# Patient Record
Sex: Female | Born: 1935
Health system: Southern US, Community
[De-identification: ages and names within clinical notes are randomized; demographics above are authoritative.]

## PROBLEM LIST (undated history)

## (undated) DIAGNOSIS — Z9289 Personal history of other medical treatment: Secondary | ICD-10-CM

## (undated) DIAGNOSIS — I4729 Other ventricular tachycardia: Secondary | ICD-10-CM

## (undated) DIAGNOSIS — I472 Ventricular tachycardia: Secondary | ICD-10-CM

## (undated) DIAGNOSIS — I4510 Unspecified right bundle-branch block: Secondary | ICD-10-CM

## (undated) DIAGNOSIS — D509 Iron deficiency anemia, unspecified: Secondary | ICD-10-CM

## (undated) DIAGNOSIS — K219 Gastro-esophageal reflux disease without esophagitis: Secondary | ICD-10-CM

## (undated) DIAGNOSIS — S82143A Displaced bicondylar fracture of unspecified tibia, initial encounter for closed fracture: Secondary | ICD-10-CM

## (undated) DIAGNOSIS — I482 Chronic atrial fibrillation, unspecified: Secondary | ICD-10-CM

## (undated) DIAGNOSIS — R809 Proteinuria, unspecified: Secondary | ICD-10-CM

## (undated) DIAGNOSIS — K922 Gastrointestinal hemorrhage, unspecified: Secondary | ICD-10-CM

## (undated) DIAGNOSIS — I059 Rheumatic mitral valve disease, unspecified: Secondary | ICD-10-CM

## (undated) DIAGNOSIS — I10 Essential (primary) hypertension: Secondary | ICD-10-CM

## (undated) DIAGNOSIS — K649 Unspecified hemorrhoids: Secondary | ICD-10-CM

## (undated) DIAGNOSIS — I420 Dilated cardiomyopathy: Secondary | ICD-10-CM

## (undated) HISTORY — DX: Iron deficiency anemia, unspecified: D50.9

## (undated) HISTORY — PX: COLONOSCOPY: SHX174

## (undated) HISTORY — PX: TUBAL LIGATION: SHX77

## (undated) HISTORY — DX: Chronic atrial fibrillation, unspecified: I48.20

## (undated) HISTORY — DX: Essential (primary) hypertension: I10

## (undated) HISTORY — PX: COLONOSCOPY W/ POLYPECTOMY: SHX1380

## (undated) HISTORY — DX: Gastrointestinal hemorrhage, unspecified: K92.2

## (undated) HISTORY — PX: US ECHOCARDIOGRAPHY: HXRAD669

## (undated) HISTORY — DX: Ventricular tachycardia: I47.2

## (undated) HISTORY — DX: Unspecified right bundle-branch block: I45.10

## (undated) HISTORY — DX: Gastro-esophageal reflux disease without esophagitis: K21.9

## (undated) HISTORY — DX: Other ventricular tachycardia: I47.29

## (undated) HISTORY — DX: Dilated cardiomyopathy: I42.0

---

## 1985-01-20 HISTORY — PX: MITRAL VALVE REPLACEMENT: SHX147

## 1997-06-08 ENCOUNTER — Encounter: Admission: RE | Admit: 1997-06-08 | Discharge: 1997-06-08 | Payer: Self-pay | Admitting: Family Medicine

## 1997-07-26 ENCOUNTER — Encounter: Admission: RE | Admit: 1997-07-26 | Discharge: 1997-07-26 | Payer: Self-pay | Admitting: Family Medicine

## 1997-08-14 ENCOUNTER — Encounter: Admission: RE | Admit: 1997-08-14 | Discharge: 1997-08-14 | Payer: Self-pay | Admitting: Family Medicine

## 1997-09-29 ENCOUNTER — Inpatient Hospital Stay (HOSPITAL_COMMUNITY): Admission: AD | Admit: 1997-09-29 | Discharge: 1997-10-11 | Payer: Self-pay | Admitting: Cardiology

## 1998-01-19 ENCOUNTER — Emergency Department (HOSPITAL_COMMUNITY): Admission: EM | Admit: 1998-01-19 | Discharge: 1998-01-19 | Payer: Self-pay | Admitting: Emergency Medicine

## 1998-01-25 ENCOUNTER — Inpatient Hospital Stay (HOSPITAL_COMMUNITY): Admission: AD | Admit: 1998-01-25 | Discharge: 1998-02-03 | Payer: Self-pay | Admitting: Internal Medicine

## 1998-01-31 ENCOUNTER — Encounter: Payer: Self-pay | Admitting: Internal Medicine

## 1998-02-03 ENCOUNTER — Emergency Department (HOSPITAL_COMMUNITY): Admission: EM | Admit: 1998-02-03 | Discharge: 1998-02-03 | Payer: Self-pay | Admitting: Emergency Medicine

## 1998-02-23 ENCOUNTER — Encounter: Admission: RE | Admit: 1998-02-23 | Discharge: 1998-02-23 | Payer: Self-pay | Admitting: Family Medicine

## 1998-04-08 ENCOUNTER — Emergency Department (HOSPITAL_COMMUNITY): Admission: EM | Admit: 1998-04-08 | Discharge: 1998-04-08 | Payer: Self-pay | Admitting: Emergency Medicine

## 1998-05-08 ENCOUNTER — Encounter: Admission: RE | Admit: 1998-05-08 | Discharge: 1998-05-08 | Payer: Self-pay | Admitting: Family Medicine

## 1998-06-26 ENCOUNTER — Encounter: Admission: RE | Admit: 1998-06-26 | Discharge: 1998-06-26 | Payer: Self-pay | Admitting: Sports Medicine

## 1998-07-04 ENCOUNTER — Encounter: Admission: RE | Admit: 1998-07-04 | Discharge: 1998-07-04 | Payer: Self-pay | Admitting: Family Medicine

## 1998-07-16 ENCOUNTER — Encounter: Admission: RE | Admit: 1998-07-16 | Discharge: 1998-07-16 | Payer: Self-pay | Admitting: Family Medicine

## 1998-08-11 ENCOUNTER — Inpatient Hospital Stay (HOSPITAL_COMMUNITY): Admission: EM | Admit: 1998-08-11 | Discharge: 1998-08-14 | Payer: Self-pay | Admitting: Emergency Medicine

## 1998-08-11 ENCOUNTER — Encounter: Payer: Self-pay | Admitting: Emergency Medicine

## 1998-08-13 ENCOUNTER — Encounter: Payer: Self-pay | Admitting: Emergency Medicine

## 1998-08-20 ENCOUNTER — Encounter: Admission: RE | Admit: 1998-08-20 | Discharge: 1998-08-20 | Payer: Self-pay | Admitting: Family Medicine

## 1998-09-03 ENCOUNTER — Encounter: Admission: RE | Admit: 1998-09-03 | Discharge: 1998-09-03 | Payer: Self-pay | Admitting: Family Medicine

## 1998-10-01 ENCOUNTER — Ambulatory Visit (HOSPITAL_COMMUNITY): Admission: RE | Admit: 1998-10-01 | Discharge: 1998-10-01 | Payer: Self-pay | Admitting: Internal Medicine

## 1999-01-08 ENCOUNTER — Encounter: Admission: RE | Admit: 1999-01-08 | Discharge: 1999-01-08 | Payer: Self-pay | Admitting: Sports Medicine

## 1999-02-05 ENCOUNTER — Encounter: Payer: Self-pay | Admitting: Gastroenterology

## 1999-02-05 ENCOUNTER — Encounter: Admission: RE | Admit: 1999-02-05 | Discharge: 1999-02-05 | Payer: Self-pay | Admitting: Gastroenterology

## 1999-03-14 ENCOUNTER — Encounter: Admission: RE | Admit: 1999-03-14 | Discharge: 1999-03-14 | Payer: Self-pay | Admitting: Family Medicine

## 1999-04-04 ENCOUNTER — Encounter: Admission: RE | Admit: 1999-04-04 | Discharge: 1999-04-04 | Payer: Self-pay | Admitting: Family Medicine

## 1999-04-22 ENCOUNTER — Encounter: Admission: RE | Admit: 1999-04-22 | Discharge: 1999-07-21 | Payer: Self-pay | Admitting: Orthopedic Surgery

## 1999-04-24 ENCOUNTER — Encounter: Admission: RE | Admit: 1999-04-24 | Discharge: 1999-04-24 | Payer: Self-pay | Admitting: Family Medicine

## 1999-04-24 ENCOUNTER — Other Ambulatory Visit: Admission: RE | Admit: 1999-04-24 | Discharge: 1999-04-24 | Payer: Self-pay | Admitting: Family Medicine

## 1999-05-11 ENCOUNTER — Inpatient Hospital Stay (HOSPITAL_COMMUNITY): Admission: EM | Admit: 1999-05-11 | Discharge: 1999-05-17 | Payer: Self-pay | Admitting: Emergency Medicine

## 1999-05-11 ENCOUNTER — Encounter: Payer: Self-pay | Admitting: *Deleted

## 1999-05-12 ENCOUNTER — Encounter: Payer: Self-pay | Admitting: *Deleted

## 1999-05-16 ENCOUNTER — Encounter: Payer: Self-pay | Admitting: *Deleted

## 1999-05-29 ENCOUNTER — Encounter (HOSPITAL_COMMUNITY): Admission: RE | Admit: 1999-05-29 | Discharge: 1999-08-27 | Payer: Self-pay | Admitting: Dentistry

## 1999-05-31 ENCOUNTER — Encounter: Admission: RE | Admit: 1999-05-31 | Discharge: 1999-05-31 | Payer: Self-pay | Admitting: Family Medicine

## 1999-06-12 ENCOUNTER — Encounter: Admission: RE | Admit: 1999-06-12 | Discharge: 1999-06-12 | Payer: Self-pay | Admitting: Family Medicine

## 1999-07-09 ENCOUNTER — Ambulatory Visit (HOSPITAL_COMMUNITY): Admission: RE | Admit: 1999-07-09 | Discharge: 1999-07-09 | Payer: Self-pay | Admitting: Dentistry

## 1999-07-14 ENCOUNTER — Emergency Department (HOSPITAL_COMMUNITY): Admission: EM | Admit: 1999-07-14 | Discharge: 1999-07-14 | Payer: Self-pay | Admitting: Emergency Medicine

## 1999-07-15 ENCOUNTER — Emergency Department (HOSPITAL_COMMUNITY): Admission: EM | Admit: 1999-07-15 | Discharge: 1999-07-15 | Payer: Self-pay | Admitting: Emergency Medicine

## 1999-07-16 ENCOUNTER — Inpatient Hospital Stay (HOSPITAL_COMMUNITY): Admission: EM | Admit: 1999-07-16 | Discharge: 1999-07-18 | Payer: Self-pay | Admitting: Emergency Medicine

## 1999-09-02 ENCOUNTER — Encounter: Admission: RE | Admit: 1999-09-02 | Discharge: 1999-09-02 | Payer: Self-pay | Admitting: Sports Medicine

## 1999-09-02 ENCOUNTER — Encounter: Payer: Self-pay | Admitting: Sports Medicine

## 1999-09-02 ENCOUNTER — Encounter (HOSPITAL_COMMUNITY): Admission: RE | Admit: 1999-09-02 | Discharge: 1999-12-01 | Payer: Self-pay | Admitting: Dentistry

## 2000-01-03 ENCOUNTER — Encounter (HOSPITAL_COMMUNITY): Admission: RE | Admit: 2000-01-03 | Discharge: 2000-04-02 | Payer: Self-pay | Admitting: Dentistry

## 2000-02-14 ENCOUNTER — Inpatient Hospital Stay (HOSPITAL_COMMUNITY): Admission: EM | Admit: 2000-02-14 | Discharge: 2000-02-16 | Payer: Self-pay | Admitting: Emergency Medicine

## 2000-02-14 ENCOUNTER — Encounter: Payer: Self-pay | Admitting: Family Medicine

## 2000-02-14 ENCOUNTER — Encounter: Admission: RE | Admit: 2000-02-14 | Discharge: 2000-02-14 | Payer: Self-pay | Admitting: Family Medicine

## 2000-02-17 ENCOUNTER — Encounter: Admission: RE | Admit: 2000-02-17 | Discharge: 2000-02-17 | Payer: Self-pay | Admitting: Family Medicine

## 2000-02-24 ENCOUNTER — Encounter: Admission: RE | Admit: 2000-02-24 | Discharge: 2000-02-24 | Payer: Self-pay | Admitting: Family Medicine

## 2000-06-19 ENCOUNTER — Encounter: Admission: RE | Admit: 2000-06-19 | Discharge: 2000-06-19 | Payer: Self-pay | Admitting: Family Medicine

## 2000-07-27 ENCOUNTER — Encounter (HOSPITAL_COMMUNITY): Admission: RE | Admit: 2000-07-27 | Discharge: 2000-10-25 | Payer: Self-pay | Admitting: Dentistry

## 2000-09-03 ENCOUNTER — Encounter: Payer: Self-pay | Admitting: Family Medicine

## 2000-09-03 ENCOUNTER — Encounter: Admission: RE | Admit: 2000-09-03 | Discharge: 2000-09-03 | Payer: Self-pay | Admitting: Family Medicine

## 2001-01-08 ENCOUNTER — Encounter: Admission: RE | Admit: 2001-01-08 | Discharge: 2001-01-08 | Payer: Self-pay | Admitting: Family Medicine

## 2001-03-05 ENCOUNTER — Encounter (HOSPITAL_COMMUNITY): Admission: RE | Admit: 2001-03-05 | Discharge: 2001-06-03 | Payer: Self-pay | Admitting: Internal Medicine

## 2001-06-01 ENCOUNTER — Inpatient Hospital Stay (HOSPITAL_COMMUNITY): Admission: AD | Admit: 2001-06-01 | Discharge: 2001-06-11 | Payer: Self-pay | Admitting: Internal Medicine

## 2001-06-04 ENCOUNTER — Encounter: Payer: Self-pay | Admitting: Internal Medicine

## 2001-06-05 ENCOUNTER — Encounter: Payer: Self-pay | Admitting: Internal Medicine

## 2001-06-06 ENCOUNTER — Encounter: Payer: Self-pay | Admitting: Gastroenterology

## 2001-06-07 ENCOUNTER — Encounter: Payer: Self-pay | Admitting: Gastroenterology

## 2001-06-08 ENCOUNTER — Encounter: Payer: Self-pay | Admitting: Internal Medicine

## 2001-07-07 ENCOUNTER — Encounter: Admission: RE | Admit: 2001-07-07 | Discharge: 2001-07-07 | Payer: Self-pay | Admitting: Family Medicine

## 2002-04-26 ENCOUNTER — Encounter: Admission: RE | Admit: 2002-04-26 | Discharge: 2002-04-26 | Payer: Self-pay | Admitting: Dentistry

## 2002-09-05 ENCOUNTER — Encounter: Admission: RE | Admit: 2002-09-05 | Discharge: 2002-09-05 | Payer: Self-pay | Admitting: Sports Medicine

## 2002-09-12 ENCOUNTER — Encounter: Admission: RE | Admit: 2002-09-12 | Discharge: 2002-09-12 | Payer: Self-pay | Admitting: Sports Medicine

## 2002-09-16 ENCOUNTER — Emergency Department (HOSPITAL_COMMUNITY): Admission: EM | Admit: 2002-09-16 | Discharge: 2002-09-16 | Payer: Self-pay | Admitting: Emergency Medicine

## 2002-09-24 ENCOUNTER — Encounter (INDEPENDENT_AMBULATORY_CARE_PROVIDER_SITE_OTHER): Payer: Self-pay | Admitting: *Deleted

## 2002-09-24 LAB — CONVERTED CEMR LAB

## 2002-09-28 ENCOUNTER — Encounter: Admission: RE | Admit: 2002-09-28 | Discharge: 2002-09-28 | Payer: Self-pay | Admitting: Family Medicine

## 2002-09-28 ENCOUNTER — Other Ambulatory Visit: Admission: RE | Admit: 2002-09-28 | Discharge: 2002-09-28 | Payer: Self-pay | Admitting: Family Medicine

## 2002-10-25 ENCOUNTER — Encounter: Payer: Self-pay | Admitting: Family Medicine

## 2002-10-25 ENCOUNTER — Encounter: Admission: RE | Admit: 2002-10-25 | Discharge: 2002-10-25 | Payer: Self-pay | Admitting: Family Medicine

## 2002-10-28 ENCOUNTER — Encounter: Admission: RE | Admit: 2002-10-28 | Discharge: 2002-10-28 | Payer: Self-pay | Admitting: Sports Medicine

## 2003-01-21 HISTORY — PX: CHOLECYSTECTOMY: SHX55

## 2003-01-27 ENCOUNTER — Encounter: Admission: RE | Admit: 2003-01-27 | Discharge: 2003-01-27 | Payer: Self-pay | Admitting: Family Medicine

## 2003-01-30 ENCOUNTER — Encounter: Admission: RE | Admit: 2003-01-30 | Discharge: 2003-01-30 | Payer: Self-pay | Admitting: Sports Medicine

## 2003-02-03 ENCOUNTER — Encounter: Admission: RE | Admit: 2003-02-03 | Discharge: 2003-02-03 | Payer: Self-pay | Admitting: Family Medicine

## 2003-02-24 ENCOUNTER — Encounter: Admission: RE | Admit: 2003-02-24 | Discharge: 2003-02-24 | Payer: Self-pay | Admitting: Family Medicine

## 2003-04-26 ENCOUNTER — Encounter: Admission: RE | Admit: 2003-04-26 | Discharge: 2003-04-26 | Payer: Self-pay | Admitting: Family Medicine

## 2003-06-19 ENCOUNTER — Inpatient Hospital Stay (HOSPITAL_COMMUNITY): Admission: EM | Admit: 2003-06-19 | Discharge: 2003-06-21 | Payer: Self-pay | Admitting: *Deleted

## 2003-08-31 ENCOUNTER — Encounter: Admission: RE | Admit: 2003-08-31 | Discharge: 2003-08-31 | Payer: Self-pay | Admitting: Sports Medicine

## 2003-11-24 ENCOUNTER — Ambulatory Visit: Payer: Self-pay | Admitting: Cardiovascular Disease

## 2003-11-27 ENCOUNTER — Encounter: Admission: RE | Admit: 2003-11-27 | Discharge: 2003-11-27 | Payer: Self-pay | Admitting: Sports Medicine

## 2003-11-29 ENCOUNTER — Ambulatory Visit: Payer: Self-pay | Admitting: Family Medicine

## 2003-12-22 ENCOUNTER — Ambulatory Visit: Payer: Self-pay | Admitting: Cardiology

## 2004-01-10 ENCOUNTER — Ambulatory Visit: Payer: Self-pay | Admitting: Family Medicine

## 2004-01-16 ENCOUNTER — Ambulatory Visit (HOSPITAL_COMMUNITY): Admission: RE | Admit: 2004-01-16 | Discharge: 2004-01-16 | Payer: Self-pay | Admitting: Sports Medicine

## 2004-01-24 ENCOUNTER — Ambulatory Visit: Payer: Self-pay | Admitting: Cardiology

## 2004-01-25 ENCOUNTER — Ambulatory Visit: Payer: Self-pay | Admitting: Internal Medicine

## 2004-02-02 ENCOUNTER — Ambulatory Visit (HOSPITAL_COMMUNITY): Admission: RE | Admit: 2004-02-02 | Discharge: 2004-02-02 | Payer: Self-pay | Admitting: *Deleted

## 2004-02-02 ENCOUNTER — Encounter (INDEPENDENT_AMBULATORY_CARE_PROVIDER_SITE_OTHER): Payer: Self-pay | Admitting: *Deleted

## 2004-02-07 ENCOUNTER — Ambulatory Visit: Payer: Self-pay | Admitting: Cardiology

## 2004-02-15 ENCOUNTER — Ambulatory Visit: Payer: Self-pay | Admitting: Cardiology

## 2004-02-29 ENCOUNTER — Ambulatory Visit: Payer: Self-pay | Admitting: Cardiology

## 2004-03-22 ENCOUNTER — Ambulatory Visit: Payer: Self-pay | Admitting: Cardiology

## 2004-04-05 ENCOUNTER — Ambulatory Visit: Payer: Self-pay | Admitting: Cardiology

## 2004-04-19 ENCOUNTER — Ambulatory Visit: Payer: Self-pay | Admitting: Cardiology

## 2004-05-06 ENCOUNTER — Ambulatory Visit: Payer: Self-pay | Admitting: *Deleted

## 2004-06-04 ENCOUNTER — Ambulatory Visit: Payer: Self-pay | Admitting: Cardiology

## 2004-06-19 ENCOUNTER — Ambulatory Visit: Payer: Self-pay | Admitting: Cardiology

## 2004-07-05 ENCOUNTER — Ambulatory Visit: Payer: Self-pay | Admitting: Cardiology

## 2004-07-15 ENCOUNTER — Ambulatory Visit: Payer: Self-pay | Admitting: Family Medicine

## 2004-07-15 ENCOUNTER — Ambulatory Visit: Payer: Self-pay | Admitting: Sports Medicine

## 2004-07-15 ENCOUNTER — Ambulatory Visit (HOSPITAL_COMMUNITY): Admission: RE | Admit: 2004-07-15 | Discharge: 2004-07-15 | Payer: Self-pay | Admitting: Sports Medicine

## 2004-07-15 ENCOUNTER — Inpatient Hospital Stay (HOSPITAL_COMMUNITY): Admission: AD | Admit: 2004-07-15 | Discharge: 2004-07-18 | Payer: Self-pay | Admitting: Family Medicine

## 2004-07-17 ENCOUNTER — Ambulatory Visit: Payer: Self-pay | Admitting: Cardiology

## 2004-07-17 ENCOUNTER — Encounter: Payer: Self-pay | Admitting: Cardiology

## 2004-07-24 ENCOUNTER — Ambulatory Visit: Payer: Self-pay

## 2004-07-26 ENCOUNTER — Ambulatory Visit: Payer: Self-pay | Admitting: Family Medicine

## 2004-08-02 ENCOUNTER — Ambulatory Visit: Payer: Self-pay | Admitting: Cardiovascular Disease

## 2004-08-27 ENCOUNTER — Ambulatory Visit: Payer: Self-pay | Admitting: Cardiology

## 2004-09-30 ENCOUNTER — Ambulatory Visit: Payer: Self-pay | Admitting: Internal Medicine

## 2004-10-28 ENCOUNTER — Ambulatory Visit: Payer: Self-pay | Admitting: Cardiology

## 2004-11-11 ENCOUNTER — Encounter: Admission: RE | Admit: 2004-11-11 | Discharge: 2004-11-11 | Payer: Self-pay | Admitting: Internal Medicine

## 2004-11-15 ENCOUNTER — Emergency Department (HOSPITAL_COMMUNITY): Admission: EM | Admit: 2004-11-15 | Discharge: 2004-11-15 | Payer: Self-pay | Admitting: Emergency Medicine

## 2004-11-15 ENCOUNTER — Ambulatory Visit: Payer: Self-pay | Admitting: Dentistry

## 2004-11-26 ENCOUNTER — Ambulatory Visit: Payer: Self-pay | Admitting: Internal Medicine

## 2004-12-23 ENCOUNTER — Ambulatory Visit: Payer: Self-pay | Admitting: Cardiology

## 2005-01-10 ENCOUNTER — Ambulatory Visit: Payer: Self-pay | Admitting: Internal Medicine

## 2005-02-07 ENCOUNTER — Ambulatory Visit: Payer: Self-pay | Admitting: Cardiology

## 2005-02-26 ENCOUNTER — Ambulatory Visit: Payer: Self-pay | Admitting: Cardiology

## 2005-02-28 ENCOUNTER — Ambulatory Visit: Payer: Self-pay | Admitting: Dentistry

## 2005-03-17 ENCOUNTER — Ambulatory Visit: Payer: Self-pay | Admitting: Internal Medicine

## 2005-03-20 ENCOUNTER — Ambulatory Visit: Payer: Self-pay | Admitting: Internal Medicine

## 2005-04-01 ENCOUNTER — Ambulatory Visit: Payer: Self-pay | Admitting: *Deleted

## 2005-04-15 ENCOUNTER — Ambulatory Visit: Payer: Self-pay | Admitting: Cardiology

## 2005-05-16 ENCOUNTER — Ambulatory Visit: Payer: Self-pay | Admitting: Cardiology

## 2005-05-26 ENCOUNTER — Ambulatory Visit: Payer: Self-pay | Admitting: Cardiology

## 2005-06-01 ENCOUNTER — Inpatient Hospital Stay (HOSPITAL_COMMUNITY): Admission: EM | Admit: 2005-06-01 | Discharge: 2005-06-04 | Payer: Self-pay | Admitting: Emergency Medicine

## 2005-06-12 ENCOUNTER — Ambulatory Visit: Payer: Self-pay | Admitting: Cardiology

## 2005-06-26 ENCOUNTER — Ambulatory Visit: Payer: Self-pay | Admitting: Internal Medicine

## 2005-07-24 ENCOUNTER — Ambulatory Visit: Payer: Self-pay | Admitting: Cardiology

## 2005-08-07 ENCOUNTER — Ambulatory Visit: Payer: Self-pay | Admitting: Cardiology

## 2005-08-14 ENCOUNTER — Emergency Department (HOSPITAL_COMMUNITY): Admission: EM | Admit: 2005-08-14 | Discharge: 2005-08-14 | Payer: Self-pay | Admitting: Family Medicine

## 2005-08-20 ENCOUNTER — Emergency Department (HOSPITAL_COMMUNITY): Admission: EM | Admit: 2005-08-20 | Discharge: 2005-08-20 | Payer: Self-pay | Admitting: Family Medicine

## 2005-08-28 ENCOUNTER — Ambulatory Visit: Payer: Self-pay | Admitting: Cardiology

## 2005-09-05 ENCOUNTER — Ambulatory Visit: Payer: Self-pay

## 2005-09-05 ENCOUNTER — Ambulatory Visit: Payer: Self-pay | Admitting: Internal Medicine

## 2005-09-17 ENCOUNTER — Ambulatory Visit: Payer: Self-pay | Admitting: Cardiology

## 2005-09-19 ENCOUNTER — Ambulatory Visit: Payer: Self-pay | Admitting: Dentistry

## 2005-10-15 ENCOUNTER — Ambulatory Visit: Payer: Self-pay | Admitting: Cardiology

## 2005-11-11 ENCOUNTER — Ambulatory Visit: Payer: Self-pay | Admitting: Cardiology

## 2005-12-08 ENCOUNTER — Ambulatory Visit: Payer: Self-pay | Admitting: Cardiology

## 2006-01-07 ENCOUNTER — Ambulatory Visit: Payer: Self-pay | Admitting: *Deleted

## 2006-01-20 HISTORY — PX: HEMICOLECTOMY: SHX854

## 2006-01-26 ENCOUNTER — Ambulatory Visit: Payer: Self-pay | Admitting: Internal Medicine

## 2006-01-26 ENCOUNTER — Inpatient Hospital Stay (HOSPITAL_COMMUNITY): Admission: AD | Admit: 2006-01-26 | Discharge: 2006-02-16 | Payer: Self-pay | Admitting: Gastroenterology

## 2006-01-27 ENCOUNTER — Encounter (INDEPENDENT_AMBULATORY_CARE_PROVIDER_SITE_OTHER): Payer: Self-pay | Admitting: Specialist

## 2006-02-03 ENCOUNTER — Encounter (INDEPENDENT_AMBULATORY_CARE_PROVIDER_SITE_OTHER): Payer: Self-pay | Admitting: *Deleted

## 2006-02-19 ENCOUNTER — Ambulatory Visit: Payer: Self-pay | Admitting: Cardiology

## 2006-03-10 ENCOUNTER — Ambulatory Visit: Payer: Self-pay | Admitting: Cardiology

## 2006-03-19 DIAGNOSIS — I4891 Unspecified atrial fibrillation: Secondary | ICD-10-CM | POA: Insufficient documentation

## 2006-03-19 DIAGNOSIS — K2100 Gastro-esophageal reflux disease with esophagitis, without bleeding: Secondary | ICD-10-CM | POA: Insufficient documentation

## 2006-03-19 DIAGNOSIS — IMO0002 Reserved for concepts with insufficient information to code with codable children: Secondary | ICD-10-CM | POA: Insufficient documentation

## 2006-03-19 DIAGNOSIS — J309 Allergic rhinitis, unspecified: Secondary | ICD-10-CM | POA: Insufficient documentation

## 2006-03-19 DIAGNOSIS — M1710 Unilateral primary osteoarthritis, unspecified knee: Secondary | ICD-10-CM | POA: Insufficient documentation

## 2006-03-19 DIAGNOSIS — D509 Iron deficiency anemia, unspecified: Secondary | ICD-10-CM | POA: Insufficient documentation

## 2006-03-19 DIAGNOSIS — I10 Essential (primary) hypertension: Secondary | ICD-10-CM | POA: Insufficient documentation

## 2006-03-19 DIAGNOSIS — M810 Age-related osteoporosis without current pathological fracture: Secondary | ICD-10-CM | POA: Insufficient documentation

## 2006-03-20 ENCOUNTER — Encounter (INDEPENDENT_AMBULATORY_CARE_PROVIDER_SITE_OTHER): Payer: Self-pay | Admitting: *Deleted

## 2006-03-31 ENCOUNTER — Ambulatory Visit: Payer: Self-pay | Admitting: Cardiology

## 2006-04-07 ENCOUNTER — Ambulatory Visit: Payer: Self-pay | Admitting: Cardiology

## 2006-04-20 ENCOUNTER — Encounter: Payer: Self-pay | Admitting: Internal Medicine

## 2006-04-20 ENCOUNTER — Ambulatory Visit: Payer: Self-pay

## 2006-04-22 ENCOUNTER — Encounter: Admission: RE | Admit: 2006-04-22 | Discharge: 2006-04-22 | Payer: Self-pay | Admitting: Family Medicine

## 2006-05-01 ENCOUNTER — Ambulatory Visit: Payer: Self-pay | Admitting: Internal Medicine

## 2006-05-15 ENCOUNTER — Ambulatory Visit: Payer: Self-pay | Admitting: Cardiology

## 2006-05-29 ENCOUNTER — Ambulatory Visit: Payer: Self-pay | Admitting: Cardiology

## 2006-05-29 ENCOUNTER — Ambulatory Visit: Payer: Self-pay | Admitting: Internal Medicine

## 2006-06-05 ENCOUNTER — Ambulatory Visit: Payer: Self-pay | Admitting: Dentistry

## 2006-06-22 ENCOUNTER — Ambulatory Visit: Payer: Self-pay | Admitting: Cardiovascular Disease

## 2006-06-29 ENCOUNTER — Ambulatory Visit: Payer: Self-pay | Admitting: Cardiovascular Disease

## 2006-07-06 ENCOUNTER — Ambulatory Visit: Payer: Self-pay | Admitting: Cardiology

## 2006-07-20 ENCOUNTER — Ambulatory Visit: Payer: Self-pay | Admitting: Cardiology

## 2006-08-10 ENCOUNTER — Ambulatory Visit: Payer: Self-pay | Admitting: Cardiovascular Disease

## 2006-08-19 ENCOUNTER — Other Ambulatory Visit: Admission: RE | Admit: 2006-08-19 | Discharge: 2006-08-19 | Payer: Self-pay | Admitting: Obstetrics and Gynecology

## 2006-08-20 ENCOUNTER — Ambulatory Visit: Payer: Self-pay | Admitting: Cardiology

## 2006-09-03 ENCOUNTER — Ambulatory Visit: Payer: Self-pay | Admitting: Internal Medicine

## 2006-09-28 ENCOUNTER — Ambulatory Visit: Payer: Self-pay | Admitting: Cardiology

## 2006-10-06 ENCOUNTER — Ambulatory Visit: Payer: Self-pay | Admitting: Cardiology

## 2006-10-20 ENCOUNTER — Ambulatory Visit: Payer: Self-pay | Admitting: Cardiology

## 2006-11-17 ENCOUNTER — Ambulatory Visit: Payer: Self-pay | Admitting: Cardiology

## 2006-11-25 ENCOUNTER — Ambulatory Visit: Payer: Self-pay | Admitting: Internal Medicine

## 2006-11-27 ENCOUNTER — Ambulatory Visit: Payer: Self-pay

## 2006-12-14 ENCOUNTER — Ambulatory Visit: Payer: Self-pay | Admitting: Dentistry

## 2006-12-18 ENCOUNTER — Ambulatory Visit: Payer: Self-pay | Admitting: Internal Medicine

## 2007-01-06 ENCOUNTER — Ambulatory Visit: Payer: Self-pay | Admitting: Cardiovascular Disease

## 2007-01-26 ENCOUNTER — Ambulatory Visit (HOSPITAL_COMMUNITY): Admission: RE | Admit: 2007-01-26 | Discharge: 2007-01-26 | Payer: Self-pay | Admitting: Gastroenterology

## 2007-02-03 ENCOUNTER — Ambulatory Visit: Payer: Self-pay | Admitting: Cardiovascular Disease

## 2007-02-17 ENCOUNTER — Ambulatory Visit: Payer: Self-pay | Admitting: Cardiology

## 2007-02-19 ENCOUNTER — Encounter: Admission: RE | Admit: 2007-02-19 | Discharge: 2007-02-19 | Payer: Self-pay | Admitting: Internal Medicine

## 2007-03-17 ENCOUNTER — Ambulatory Visit: Payer: Self-pay | Admitting: Internal Medicine

## 2007-04-07 ENCOUNTER — Ambulatory Visit: Payer: Self-pay | Admitting: Cardiology

## 2007-05-05 ENCOUNTER — Ambulatory Visit: Payer: Self-pay | Admitting: Cardiology

## 2007-05-13 ENCOUNTER — Encounter: Admission: RE | Admit: 2007-05-13 | Discharge: 2007-05-13 | Payer: Self-pay | Admitting: Internal Medicine

## 2007-05-19 ENCOUNTER — Ambulatory Visit: Payer: Self-pay | Admitting: Cardiology

## 2007-05-26 ENCOUNTER — Encounter: Admission: RE | Admit: 2007-05-26 | Discharge: 2007-08-24 | Payer: Self-pay | Admitting: Neurosurgery

## 2007-05-29 ENCOUNTER — Emergency Department (HOSPITAL_COMMUNITY): Admission: EM | Admit: 2007-05-29 | Discharge: 2007-05-29 | Payer: Self-pay | Admitting: Emergency Medicine

## 2007-06-09 ENCOUNTER — Ambulatory Visit: Payer: Self-pay | Admitting: Cardiology

## 2007-06-17 ENCOUNTER — Ambulatory Visit: Payer: Self-pay | Admitting: Internal Medicine

## 2007-07-07 ENCOUNTER — Ambulatory Visit: Payer: Self-pay | Admitting: Internal Medicine

## 2007-07-27 ENCOUNTER — Ambulatory Visit: Payer: Self-pay | Admitting: Dentistry

## 2007-07-28 ENCOUNTER — Ambulatory Visit: Payer: Self-pay | Admitting: Internal Medicine

## 2007-08-25 ENCOUNTER — Ambulatory Visit: Payer: Self-pay | Admitting: Cardiology

## 2007-09-24 ENCOUNTER — Ambulatory Visit: Payer: Self-pay | Admitting: Cardiovascular Disease

## 2007-10-25 ENCOUNTER — Ambulatory Visit: Payer: Self-pay | Admitting: Internal Medicine

## 2007-11-22 ENCOUNTER — Ambulatory Visit: Payer: Self-pay | Admitting: Cardiology

## 2007-12-13 ENCOUNTER — Ambulatory Visit: Payer: Self-pay | Admitting: Internal Medicine

## 2007-12-27 ENCOUNTER — Ambulatory Visit: Payer: Self-pay | Admitting: Cardiovascular Disease

## 2008-01-24 ENCOUNTER — Ambulatory Visit: Payer: Self-pay | Admitting: Internal Medicine

## 2008-02-07 ENCOUNTER — Ambulatory Visit: Payer: Self-pay | Admitting: Cardiology

## 2008-02-16 ENCOUNTER — Ambulatory Visit: Payer: Self-pay | Admitting: Cardiovascular Disease

## 2008-02-16 DIAGNOSIS — I4510 Unspecified right bundle-branch block: Secondary | ICD-10-CM | POA: Insufficient documentation

## 2008-02-16 DIAGNOSIS — I472 Ventricular tachycardia, unspecified: Secondary | ICD-10-CM | POA: Insufficient documentation

## 2008-02-16 DIAGNOSIS — Z952 Presence of prosthetic heart valve: Secondary | ICD-10-CM | POA: Insufficient documentation

## 2008-02-24 ENCOUNTER — Ambulatory Visit: Payer: Self-pay | Admitting: Internal Medicine

## 2008-02-29 ENCOUNTER — Ambulatory Visit: Payer: Self-pay | Admitting: Cardiology

## 2008-02-29 ENCOUNTER — Ambulatory Visit: Payer: Self-pay

## 2008-02-29 LAB — CONVERTED CEMR LAB
BUN: 19 mg/dL (ref 6–23)
Basophils Absolute: 0 10*3/uL (ref 0.0–0.1)
Basophils Relative: 0.5 % (ref 0.0–3.0)
CO2: 26 meq/L (ref 19–32)
Calcium: 9 mg/dL (ref 8.4–10.5)
Chloride: 103 meq/L (ref 96–112)
Creatinine, Ser: 1.3 mg/dL — ABNORMAL HIGH (ref 0.4–1.2)
Eosinophils Absolute: 0.1 10*3/uL (ref 0.0–0.7)
Eosinophils Relative: 1.2 % (ref 0.0–5.0)
GFR calc Af Amer: 52 mL/min
GFR calc non Af Amer: 43 mL/min
Glucose, Bld: 90 mg/dL (ref 70–99)
HCT: 34.9 % — ABNORMAL LOW (ref 36.0–46.0)
Hemoglobin: 11.8 g/dL — ABNORMAL LOW (ref 12.0–15.0)
Lymphocytes Relative: 35.5 % (ref 12.0–46.0)
MCHC: 33.9 g/dL (ref 30.0–36.0)
MCV: 86.1 fL (ref 78.0–100.0)
Monocytes Absolute: 0.5 10*3/uL (ref 0.1–1.0)
Monocytes Relative: 6.8 % (ref 3.0–12.0)
Neutro Abs: 4.4 10*3/uL (ref 1.4–7.7)
Neutrophils Relative %: 56 % (ref 43.0–77.0)
Platelets: 176 10*3/uL (ref 150–400)
Potassium: 3.6 meq/L (ref 3.5–5.1)
RBC: 4.06 M/uL (ref 3.87–5.11)
RDW: 13.8 % (ref 11.5–14.6)
Sodium: 137 meq/L (ref 135–145)
WBC: 7.7 10*3/uL (ref 4.5–10.5)

## 2008-03-01 ENCOUNTER — Ambulatory Visit: Payer: Self-pay | Admitting: Cardiology

## 2008-03-01 ENCOUNTER — Ambulatory Visit: Payer: Self-pay

## 2008-03-01 ENCOUNTER — Encounter: Payer: Self-pay | Admitting: Internal Medicine

## 2008-03-17 ENCOUNTER — Ambulatory Visit: Payer: Self-pay | Admitting: Internal Medicine

## 2008-04-04 ENCOUNTER — Ambulatory Visit: Payer: Self-pay | Admitting: Internal Medicine

## 2008-04-28 ENCOUNTER — Ambulatory Visit: Payer: Self-pay | Admitting: Cardiology

## 2008-05-05 ENCOUNTER — Telehealth (INDEPENDENT_AMBULATORY_CARE_PROVIDER_SITE_OTHER): Payer: Self-pay | Admitting: *Deleted

## 2008-05-08 ENCOUNTER — Telehealth: Payer: Self-pay | Admitting: Internal Medicine

## 2008-05-17 ENCOUNTER — Emergency Department (HOSPITAL_COMMUNITY): Admission: EM | Admit: 2008-05-17 | Discharge: 2008-05-17 | Payer: Self-pay | Admitting: Emergency Medicine

## 2008-05-24 ENCOUNTER — Ambulatory Visit: Payer: Self-pay | Admitting: Internal Medicine

## 2008-05-24 ENCOUNTER — Ambulatory Visit: Payer: Self-pay | Admitting: Cardiology

## 2008-05-24 DIAGNOSIS — R350 Frequency of micturition: Secondary | ICD-10-CM | POA: Insufficient documentation

## 2008-05-25 ENCOUNTER — Emergency Department (HOSPITAL_COMMUNITY): Admission: EM | Admit: 2008-05-25 | Discharge: 2008-05-25 | Payer: Self-pay | Admitting: Family Medicine

## 2008-06-21 ENCOUNTER — Ambulatory Visit: Payer: Self-pay | Admitting: Cardiology

## 2008-06-21 ENCOUNTER — Encounter: Payer: Self-pay | Admitting: *Deleted

## 2008-06-21 LAB — CONVERTED CEMR LAB
POC INR: 2.3
Protime: 18.5

## 2008-07-20 ENCOUNTER — Ambulatory Visit: Payer: Self-pay | Admitting: Cardiology

## 2008-07-26 ENCOUNTER — Encounter: Payer: Self-pay | Admitting: *Deleted

## 2008-08-18 ENCOUNTER — Ambulatory Visit: Payer: Self-pay | Admitting: Cardiology

## 2008-08-18 LAB — CONVERTED CEMR LAB
POC INR: 3.4
Prothrombin Time: 22.2 s

## 2008-09-14 ENCOUNTER — Encounter: Admission: RE | Admit: 2008-09-14 | Discharge: 2008-09-14 | Payer: Self-pay | Admitting: Internal Medicine

## 2008-09-15 ENCOUNTER — Ambulatory Visit: Payer: Self-pay | Admitting: Internal Medicine

## 2008-09-15 LAB — CONVERTED CEMR LAB: POC INR: 3.2

## 2008-09-22 ENCOUNTER — Telehealth: Payer: Self-pay | Admitting: Internal Medicine

## 2008-09-28 ENCOUNTER — Telehealth: Payer: Self-pay | Admitting: Internal Medicine

## 2008-10-13 ENCOUNTER — Ambulatory Visit: Payer: Self-pay | Admitting: Internal Medicine

## 2008-10-13 LAB — CONVERTED CEMR LAB: POC INR: 2.7

## 2008-10-17 ENCOUNTER — Other Ambulatory Visit: Admission: RE | Admit: 2008-10-17 | Discharge: 2008-10-17 | Payer: Self-pay | Admitting: Obstetrics and Gynecology

## 2008-11-08 ENCOUNTER — Telehealth (INDEPENDENT_AMBULATORY_CARE_PROVIDER_SITE_OTHER): Payer: Self-pay | Admitting: *Deleted

## 2008-11-10 ENCOUNTER — Ambulatory Visit: Payer: Self-pay | Admitting: Internal Medicine

## 2008-11-10 LAB — CONVERTED CEMR LAB: POC INR: 3.1

## 2008-12-08 ENCOUNTER — Ambulatory Visit: Payer: Self-pay | Admitting: Cardiology

## 2008-12-08 LAB — CONVERTED CEMR LAB: POC INR: 3

## 2008-12-21 ENCOUNTER — Encounter: Payer: Self-pay | Admitting: Cardiology

## 2009-01-10 ENCOUNTER — Ambulatory Visit: Payer: Self-pay | Admitting: Internal Medicine

## 2009-01-10 LAB — CONVERTED CEMR LAB: POC INR: 1.8

## 2009-01-24 ENCOUNTER — Encounter (INDEPENDENT_AMBULATORY_CARE_PROVIDER_SITE_OTHER): Payer: Self-pay | Admitting: Cardiology

## 2009-01-24 ENCOUNTER — Ambulatory Visit: Payer: Self-pay | Admitting: Cardiology

## 2009-01-24 LAB — CONVERTED CEMR LAB: POC INR: 2.6

## 2009-01-28 ENCOUNTER — Inpatient Hospital Stay (HOSPITAL_COMMUNITY): Admission: EM | Admit: 2009-01-28 | Discharge: 2009-02-01 | Payer: Self-pay | Admitting: Internal Medicine

## 2009-01-28 ENCOUNTER — Ambulatory Visit: Payer: Self-pay | Admitting: Cardiology

## 2009-01-30 ENCOUNTER — Encounter (INDEPENDENT_AMBULATORY_CARE_PROVIDER_SITE_OTHER): Payer: Self-pay | Admitting: Gastroenterology

## 2009-01-31 ENCOUNTER — Encounter: Payer: Self-pay | Admitting: Cardiology

## 2009-02-05 ENCOUNTER — Ambulatory Visit: Payer: Self-pay | Admitting: Cardiology

## 2009-02-05 ENCOUNTER — Encounter (INDEPENDENT_AMBULATORY_CARE_PROVIDER_SITE_OTHER): Payer: Self-pay | Admitting: Cardiology

## 2009-02-05 ENCOUNTER — Encounter: Payer: Self-pay | Admitting: Internal Medicine

## 2009-02-05 LAB — CONVERTED CEMR LAB: POC INR: 1.9

## 2009-02-06 ENCOUNTER — Telehealth (INDEPENDENT_AMBULATORY_CARE_PROVIDER_SITE_OTHER): Payer: Self-pay | Admitting: *Deleted

## 2009-02-06 ENCOUNTER — Ambulatory Visit: Payer: Self-pay | Admitting: Internal Medicine

## 2009-02-06 ENCOUNTER — Telehealth: Payer: Self-pay | Admitting: Cardiology

## 2009-02-06 ENCOUNTER — Inpatient Hospital Stay (HOSPITAL_COMMUNITY): Admission: EM | Admit: 2009-02-06 | Discharge: 2009-02-09 | Payer: Self-pay | Admitting: Emergency Medicine

## 2009-02-16 ENCOUNTER — Ambulatory Visit: Payer: Self-pay | Admitting: Internal Medicine

## 2009-02-16 LAB — CONVERTED CEMR LAB: POC INR: 1.7

## 2009-02-22 ENCOUNTER — Ambulatory Visit: Payer: Self-pay | Admitting: Cardiology

## 2009-02-22 ENCOUNTER — Encounter (INDEPENDENT_AMBULATORY_CARE_PROVIDER_SITE_OTHER): Payer: Self-pay | Admitting: Cardiology

## 2009-02-22 LAB — CONVERTED CEMR LAB: POC INR: 2.4

## 2009-03-08 ENCOUNTER — Ambulatory Visit: Payer: Self-pay | Admitting: Cardiology

## 2009-03-08 LAB — CONVERTED CEMR LAB: POC INR: 2.3

## 2009-03-30 ENCOUNTER — Ambulatory Visit: Payer: Self-pay | Admitting: Cardiology

## 2009-03-30 LAB — CONVERTED CEMR LAB: POC INR: 3.1

## 2009-04-23 ENCOUNTER — Ambulatory Visit: Payer: Self-pay | Admitting: Cardiovascular Disease

## 2009-04-23 LAB — CONVERTED CEMR LAB: POC INR: 2.4

## 2009-05-07 ENCOUNTER — Ambulatory Visit: Payer: Self-pay | Admitting: Cardiology

## 2009-05-07 LAB — CONVERTED CEMR LAB
INR: 2.5
POC INR: 2.5

## 2009-06-04 ENCOUNTER — Ambulatory Visit: Payer: Self-pay | Admitting: Internal Medicine

## 2009-06-04 LAB — CONVERTED CEMR LAB: POC INR: 2.9

## 2009-07-02 ENCOUNTER — Ambulatory Visit: Payer: Self-pay | Admitting: Cardiovascular Disease

## 2009-07-02 LAB — CONVERTED CEMR LAB: POC INR: 3

## 2009-08-03 ENCOUNTER — Ambulatory Visit: Payer: Self-pay | Admitting: Cardiovascular Disease

## 2009-08-03 LAB — CONVERTED CEMR LAB: POC INR: 2.8

## 2009-08-31 ENCOUNTER — Ambulatory Visit: Payer: Self-pay | Admitting: Cardiology

## 2009-08-31 LAB — CONVERTED CEMR LAB: INR: 4.8

## 2009-09-13 ENCOUNTER — Ambulatory Visit: Payer: Self-pay | Admitting: Internal Medicine

## 2009-09-13 LAB — CONVERTED CEMR LAB: POC INR: 3.3

## 2009-09-27 ENCOUNTER — Encounter: Admission: RE | Admit: 2009-09-27 | Discharge: 2009-09-27 | Payer: Self-pay | Admitting: Internal Medicine

## 2009-10-15 ENCOUNTER — Ambulatory Visit: Payer: Self-pay | Admitting: Internal Medicine

## 2009-10-15 LAB — CONVERTED CEMR LAB: POC INR: 2.8

## 2009-10-29 ENCOUNTER — Ambulatory Visit: Payer: Self-pay | Admitting: Internal Medicine

## 2009-11-12 ENCOUNTER — Ambulatory Visit: Payer: Self-pay | Admitting: Internal Medicine

## 2009-11-12 LAB — CONVERTED CEMR LAB: POC INR: 4.3

## 2009-11-27 ENCOUNTER — Ambulatory Visit: Payer: Self-pay | Admitting: Internal Medicine

## 2009-12-21 ENCOUNTER — Ambulatory Visit: Payer: Self-pay | Admitting: Cardiology

## 2009-12-21 LAB — CONVERTED CEMR LAB: POC INR: 3.4

## 2010-01-23 ENCOUNTER — Ambulatory Visit: Admission: RE | Admit: 2010-01-23 | Discharge: 2010-01-23 | Payer: Self-pay | Source: Home / Self Care

## 2010-01-23 LAB — CONVERTED CEMR LAB: POC INR: 3.2

## 2010-02-10 ENCOUNTER — Encounter: Payer: Self-pay | Admitting: Family Medicine

## 2010-02-10 ENCOUNTER — Encounter: Payer: Self-pay | Admitting: Internal Medicine

## 2010-02-19 ENCOUNTER — Ambulatory Visit: Admission: RE | Admit: 2010-02-19 | Discharge: 2010-02-19 | Payer: Self-pay | Source: Home / Self Care

## 2010-02-19 LAB — CONVERTED CEMR LAB: POC INR: 3.7

## 2010-02-19 NOTE — Progress Notes (Signed)
Summary: refill  Phone Note Refill Request Message from:  Patient on September 22, 2008 2:37 PM  Refills Requested: Medication #1:  METOPROLOL TARTRATE 50 MG TABS Take 1 1/2 tablet by mouth twice a day   Supply Requested: 1 month [facility] [phone]   Method Requested: Electronic Initial call taken by: Migdalia Dk,  September 22, 2008 2:38 PM    Prescriptions: METOPROLOL TARTRATE 50 MG TABS (METOPROLOL TARTRATE) Take 1 1/2 tablet by mouth twice a day  #90 x 3   Entered by:   Flonnie Overman   Authorized by:   Laren Boom, MD, [facility]   Signed by:   Flonnie Overman on 09/22/2008   Method used:   [facility]        [facility]  [address] [id number]       [address]       [place], [place]  [address]       Ph: [phone] or [phone]       Fax: [phone]   RxID:   [id number]

## 2010-02-19 NOTE — Medication Information (Signed)
Summary: rov/sp  Anticoagulant Therapy  Managed by: Weston Brass, PharmD Referring MD: Sharrell Ku Supervising MD: Eden Emms MD, Theron Arista Indication 1: Mitral Valve Replacement (ICD-V43.3) Indication 2: Mitral Valve Disorder (ICD-424.0) Lab Used: [facility] Site: [facility] INR POC 3.0 INR RANGE 2.5 - 3.5  Dietary changes: no    Health status changes: no    Bleeding/hemorrhagic complications: no    Recent/future hospitalizations: no    Any changes in medication regimen? no    Recent/future dental: no  Any missed doses?: no       Is patient compliant with meds? yes       Allergies: 1)  ! * Nexium  Anticoagulation Management History:      The patient is taking warfarin and comes in today for a routine follow up visit.  Positive risk factors for bleeding include an age of 73 years or older.  The bleeding index is 'intermediate risk'.  Positive CHADS2 values include History of HTN.  Negative CHADS2 values include Age > 33 years old.  The start date was 05/19/1997.  Her last INR was 2.5.  Anticoagulation responsible provider: Eden Emms MD, Theron Arista.  INR POC: 3.0.  Cuvette Lot#: [id number].  Exp: 08/2010.    Anticoagulation Management Assessment/Plan:      The patient's current anticoagulation dose is Coumadin 5 mg tabs: Take as directed by coumadin clinic..  The target INR is 2.5 - 3.5.  The next INR is due 07/30/2009.  Anticoagulation instructions were given to patient.  Results were reviewed/authorized by Weston Brass, PharmD.  She was notified by Weston Brass PharmD.         Prior Anticoagulation Instructions: INR 2.9  Continue same dose of 1 tablet every day except 1 1/2 tablets on Monday, Wednesday and Friday   Current Anticoagulation Instructions: INR 3.0  Continue same dose of 1 tablet every day except 1 1/2 tablets on Monday, Wednesday and Friday

## 2010-02-19 NOTE — Letter (Signed)
Summary: Handout Printed  Printed Handout:  - Coumadin Instructions-w/out Meds 

## 2010-02-19 NOTE — Consult Note (Signed)
Summary: [facility]  [facility]   Imported By: Marylou Mccoy 03/01/2009 14:01:02  _____________________________________________________________________  External Attachment:    Type:   Image     Comment:   External Document

## 2010-02-19 NOTE — Medication Information (Signed)
Summary: ROV  JS  Anticoagulant Therapy  Managed by: Bethena Midget, RN, BSN Referring MD: Sharrell Ku Supervising MD: Myrtis Ser MD, Tinnie Gens Indication 1: Mitral Valve Replacement (ICD-V43.3) Indication 2: Mitral Valve Disorder (ICD-424.0) Lab Used: [facility] Site: [facility] INR RANGE 2.5 - 3.5  Dietary changes: no    Health status changes: no    Bleeding/hemorrhagic complications: no    Recent/future hospitalizations: no    Any changes in medication regimen? no    Recent/future dental: no  Any missed doses?: no       Is patient compliant with meds? yes       Current Medications (verified): 1)  Diltiazem Hcl Coated Beads 180 Mg Xr24h-Cap (Diltiazem Hcl Coated Beads) .... Take 1 Capsule By Mouth Once A Day 2)  Lanoxin 0.125 Mg Tabs (Digoxin) .... Take 1 Tablet By Mouth Once A Day 3)  Metoprolol Tartrate 50 Mg Tabs (Metoprolol Tartrate) .... Take 1 1/2 Tablet By Mouth Twice A Day 4)  Caltrate 600+d Plus 600-400 Mg-Unit Tabs (Calcium Carbonate-Vit D-Min) .... Take 1 Tablet By Mouth Twice A Day 5)  Boniva 3 Mg/63ml Kit (Ibandronate Sodium) .... Take 1 By Mouth Monthly 6)  Hydrochlorothiazide 25 Mg Tabs (Hydrochlorothiazide) .... Take 1/2 By Mouth Once Daily 7)  Protonix 40 Mg Tbec (Pantoprazole Sodium) .... Take 1 By Mouth Once Daily 8)  Amoxicillin 500 Mg Caps (Amoxicillin) .... As Needed For Dental Appt. 9)  Coumadin 5 Mg Tabs (Warfarin Sodium) .... Sunday - 1 Tab, Monday - 1.5 Tabs, Tuesday - 1 Tab, Wednesday - 1 Tab, Thursday - 1 Tab, Friday - 1.5 Tabs, Saturday - 1 Tab 10)  Vitamin D 400 Unit Tabs (Cholecalciferol) .... Take 1/2 Tablet Daily  Allergies (verified): No Known Drug Allergies  Anticoagulation Management History:      The patient is taking warfarin and comes in today for a routine follow up visit.  Positive risk factors for bleeding include an age of 71 years or older.  The bleeding index is 'intermediate risk'.  Positive CHADS2 values include History of HTN.   Negative CHADS2 values include Age > 77 years old.  The start date was 05/19/1997.  Anticoagulation responsible provider: Myrtis Ser MD, Tinnie Gens.  Cuvette Lot#: [id number].  Exp: 07/2009.    Anticoagulation Management Assessment/Plan:      The patient's current anticoagulation dose is Coumadin 5 mg tabs: Sunday - 1 tab, Monday - 1.5 tabs, Tuesday - 1 tab, Wednesday - 1 tab, Thursday - 1 tab, Friday - 1.5 tabs, Saturday - 1 tab.  The next INR is due 08/17/2008.  Anticoagulation instructions were given to patient.  Results were reviewed/authorized by Bethena Midget, RN, BSN.  She was notified by Bethena Midget, RN, BSN.         Prior Anticoagulation Instructions: Coumadin 5 mg tabs: Sunday - 1 tab, Monday - 1.5 tabs, Tuesday - 1 tab, Wednesday - 1 tab, Thursday - 1 tab, Friday - 1.5 tabs, Saturday - 1 tab.    Appended Document: [facility]    Anticoagulant Therapy  Managed by: Bethena Midget, RN, BSN Referring MD: Sharrell Ku Supervising MD: Myrtis Ser MD, Tinnie Gens Indication 1: Mitral Valve Replacement (ICD-V43.3) Indication 2: Mitral Valve Disorder (ICD-424.0) Lab Used: [facility] Site: [facility] PT 20.2 INR POC 2.8 INR RANGE 2.5 - 3.5  Dietary changes: no    Health status changes: no    Bleeding/hemorrhagic complications: no    Recent/future hospitalizations: no    Any changes in medication regimen? no  Recent/future dental: no  Any missed doses?: no       Is patient compliant with meds? yes       Current Medications (verified): 1)  Diltiazem Hcl Coated Beads 180 Mg Xr24h-Cap (Diltiazem Hcl Coated Beads) .... Take 1 Capsule By Mouth Once A Day 2)  Lanoxin 0.125 Mg Tabs (Digoxin) .... Take 1 Tablet By Mouth Once A Day 3)  Metoprolol Tartrate 50 Mg Tabs (Metoprolol Tartrate) .... Take 1 1/2 Tablet By Mouth Twice A Day 4)  Caltrate 600+d Plus 600-400 Mg-Unit Tabs (Calcium Carbonate-Vit D-Min) .... Take 1 Tablet By Mouth Twice A Day 5)  Boniva 3 Mg/34ml Kit (Ibandronate Sodium) ....  Take 1 By Mouth Monthly 6)  Hydrochlorothiazide 25 Mg Tabs (Hydrochlorothiazide) .... Take 1/2 By Mouth Once Daily 7)  Protonix 40 Mg Tbec (Pantoprazole Sodium) .... Take 1 By Mouth Once Daily 8)  Amoxicillin 500 Mg Caps (Amoxicillin) .... As Needed For Dental Appt. 9)  Coumadin 5 Mg Tabs (Warfarin Sodium) .... Sunday - 1 Tab, Monday - 1.5 Tabs, Tuesday - 1 Tab, Wednesday - 1 Tab, Thursday - 1 Tab, Friday - 1.5 Tabs, Saturday - 1 Tab 10)  Vitamin D 400 Unit Tabs (Cholecalciferol) .... Take 1/2 Tablet Daily  Allergies (verified): No Known Drug Allergies  Anticoagulation Management History:      The patient is taking warfarin and comes in today for a routine follow up visit.  Positive risk factors for bleeding include an age of 68 years or older.  The bleeding index is 'intermediate risk'.  Positive CHADS2 values include History of HTN.  Negative CHADS2 values include Age > 34 years old.  The start date was 05/19/1997.  Prothrombin time is 20.2.  Anticoagulation responsible provider: Myrtis Ser MD, Tinnie Gens.  INR POC: 2.8.  Cuvette Lot#: [id number].  Exp: 07/2009.    Anticoagulation Management Assessment/Plan:      The patient's current anticoagulation dose is Coumadin 5 mg tabs: Sunday - 1 tab, Monday - 1.5 tabs, Tuesday - 1 tab, Wednesday - 1 tab, Thursday - 1 tab, Friday - 1.5 tabs, Saturday - 1 tab.  The next INR is due 08/17/2008.  Anticoagulation instructions were given to patient.  Results were reviewed/authorized by Bethena Midget, RN, BSN.  She was notified by Bethena Midget, RN, BSN.         Prior Anticoagulation Instructions: Coumadin 5 mg tabs: Sunday - 1 tab, Monday - 1.5 tabs, Tuesday - 1 tab, Wednesday - 1 tab, Thursday - 1 tab, Friday - 1.5 tabs, Saturday - 1 tab.

## 2010-02-19 NOTE — Assessment & Plan Note (Signed)
Summary: ROV/SL   CC:  rov.  History of Present Illness: [name] returns today for followup.  She is a pleasant 75 yo woman with a h/o mitral valve disease, s/p replacement with chronic atrial fibrillation, and chronic CHF which has  been mostly class 2.  She also has a h/o NSVT which was initially treated with amiodarone which was stopped several yrs ago and her VT has been quiescent.  Her LV function has normalized.  She returns today for additional evaluation.  In the interim, she denies c/p or sob.  She's had no syncope.  Current Medications (verified): 1)  Diltiazem Hcl Coated Beads 180 Mg Xr24h-Cap (Diltiazem Hcl Coated Beads) .... Take 1 Capsule By Mouth Once A Day 2)  Lanoxin 0.125 Mg Tabs (Digoxin) .... Take 1 Tablet By Mouth Once A Day 3)  Metoprolol Tartrate 50 Mg Tabs (Metoprolol Tartrate) .... Take 1 1/2 Tablet By Mouth Twice A Day 4)  Warfarin Sodium 5 Mg Tabs (Warfarin Sodium) .... Take As Directed 5)  Caltrate 600+d Plus 600-400 Mg-Unit Tabs (Calcium Carbonate-Vit D-Min) .... Take 1 Tablet By Mouth Twice A Day 6)  Boniva 3 Mg/65ml Kit (Ibandronate Sodium) .... Take 1 By Mouth Monthly 7)  Hydrochlorothiazide 25 Mg Tabs (Hydrochlorothiazide) .... Take 1/2 By Mouth Once Daily 8)  Protonix 40 Mg Tbec (Pantoprazole Sodium) .... Take 1 By Mouth Once Daily 9)  Amoxicillin 500 Mg Caps (Amoxicillin) .... As Needed For Dental Appt.  Allergies (verified): No Known Drug Allergies  Past History:  Past Medical History:    Hgb Electrophoresis wnl (7/06), -hosp. for syncope 7/00 (see procedures),     -hx of colon polyp removed `98- f/u q35yrs,     mod severe sensorineural hearing loss bilat- ENT, -    nonsustained V-tach,    -severe mitral valve dz. S/P [employer] MVR `87    Chronic coumadin    Chronic Atrial Fibrillation    Asthma    Hypertension    GI Bleed secondary to tubolovillous adenoma with no malignancy 1/08    remote hx dilated cardiomyopathy now resolved    RBBB  (02/16/2008)  Review of Systems  The patient denies weight loss, chest pain, syncope, dyspnea on exertion, and peripheral edema.    Vital Signs:  Patient profile:   75 year old female Height:      68 inches Weight:      178 pounds BMI:     27.16 Pulse rate:   62 / minute BP sitting:   132 / 88  (left arm)  Vitals Entered By: [name], EMT-P (May 24, 2008 9:28 AM)  Physical Exam  General:  Well developed, well nourished, in no acute distress. Head:  normocephalic and atraumatic Eyes:  PERRLA/EOM intact; conjunctiva and lids normal. Mouth:  Teeth, gums and palate normal. Oral mucosa normal. Neck:  Neck supple, no JVD. No masses, thyromegaly or abnormal cervical nodes. Lungs:  Clear bilaterally to auscultation and percussion. Heart:  IRIR with mechanical S1 and split S2.  Soft systolic murmur at the base.  PMI is not enlarged or laterally displaced. Abdomen:  Bowel sounds positive; abdomen soft and non-tender without masses, organomegaly, or hernias noted. No hepatosplenomegaly. Msk:  Back normal, normal gait. Muscle strength and tone normal. Pulses:  pulses normal in all 4 extremities Extremities:  No clubbing or cyanosis. Neurologic:  Alert and oriented x 3.   EKG  Procedure date:  05/24/2008  Findings:      Atrial fibrillation with a  controlled ventricular response rate of:62  Right bundle branch block.    Impression & Recommendations:  Problem # 1:  PROSTHETIC VALVE-MECHANICAL (ICD-V43.3) Her valve appears to  be working normally.  Will hold off on 2-D echo for now.  Problem # 2:  ATRIAL FIBRILLATION (ICD-427.31) Her ventricular rate appears to be well controlled.  No symptomatic palpitations at present. The following medications were removed from the medication list:    Amiodarone Hcl 200 Mg Tabs (Amiodarone hcl)    Lopressor 100 Mg Tabs (Metoprolol tartrate) .Marland Kitchen... Take 1 tablet by mouth twice a day    Warfarin Sodium 2.5 Mg Tabs (Warfarin sodium) .Marland Kitchen... Take as  directed Her updated medication list for this problem includes:    Lanoxin 0.125 Mg Tabs (Digoxin) .Marland Kitchen... Take 1 tablet by mouth once a day    Metoprolol Tartrate 50 Mg Tabs (Metoprolol tartrate) .Marland Kitchen... Take 1 1/2 tablet by mouth twice a day    Warfarin Sodium 5 Mg Tabs (Warfarin sodium) .Marland Kitchen... Take as directed  Problem # 3:  VENTRICULAR TACHYCARDIA (ICD-427.1) She's had no symptomatic VT at present.  She is off of amiodarone. The following medications were removed from the medication list:    Amiodarone Hcl 200 Mg Tabs (Amiodarone hcl)    Lopressor 100 Mg Tabs (Metoprolol tartrate) .Marland Kitchen... Take 1 tablet by mouth twice a day    Warfarin Sodium 2.5 Mg Tabs (Warfarin sodium) .Marland Kitchen... Take as directed Her updated medication list for this problem includes:    Diltiazem Hcl Coated Beads 180 Mg Xr24h-cap (Diltiazem hcl coated beads) .Marland Kitchen... Take 1 capsule by mouth once a day    Metoprolol Tartrate 50 Mg Tabs (Metoprolol tartrate) .Marland Kitchen... Take 1 1/2 tablet by mouth twice a day    Warfarin Sodium 5 Mg Tabs (Warfarin sodium) .Marland Kitchen... Take as directed  Orders: EKG w/ Interpretation (93000)  Problem # 4:  URINARY FREQUENCY (ICD-788.41) This was her main complaint today.  She has recently had blood work at Dr. Idelle Crouch office and I suspect a glucose and urine have been obtained so I will not repeat these today.  I have encouraged her to follow back up with Dr. Idelle Crouch office and notify him of her symptoms and I will defer additional eval to Dr. Nehemiah Settle.  She denies fever or chills or dysuria.  Patient Instructions: 1)  Your physician recommends that you schedule a follow-up appointment in: 12 months

## 2010-02-19 NOTE — Medication Information (Signed)
Summary: rov/ez  Anticoagulant Therapy  Managed by: Shelby Dubin, PharmD, BCPS, CPP Referring MD: Sharrell Ku Supervising MD: Juanda Chance MD, Jakalyn Kratky Indication 1: Mitral Valve Replacement (ICD-V43.3) Indication 2: Mitral Valve Disorder (ICD-424.0) Lab Used: [facility] Site: [facility] INR POC 2.4 INR RANGE 2.5 - 3.5  Dietary changes: no    Health status changes: no    Bleeding/hemorrhagic complications: no    Recent/future hospitalizations: no    Any changes in medication regimen? no    Recent/future dental: no  Any missed doses?: no       Is patient compliant with meds? yes       Allergies (verified): 1)  ! * Nexium  Anticoagulation Management History:      The patient is taking warfarin and comes in today for a routine follow up visit.  Positive risk factors for bleeding include an age of 33 years or older.  The bleeding index is 'intermediate risk'.  Positive CHADS2 values include History of HTN.  Negative CHADS2 values include Age > 36 years old.  The start date was 05/19/1997.  Anticoagulation responsible provider: Juanda Chance MD, Smitty Cords.  INR POC: 2.4.  Cuvette Lot#: [id number].  Exp: 04/2010.    Anticoagulation Management Assessment/Plan:      The patient's current anticoagulation dose is Coumadin 5 mg tabs: Take as directed by coumadin clinic..  The target INR is 2.5 - 3.5.  The next INR is due 03/08/2009.  Anticoagulation instructions were given to patient.  Results were reviewed/authorized by Shelby Dubin, PharmD, BCPS, CPP.  She was notified by Shelby Dubin PharmD, BCPS, CPP.         Prior Anticoagulation Instructions: INR 1.7 Take 1 1/2 pill today, Saturday take 1 1/2 pills then change dose to  1 pill everyday except 1 1/2 pills on Mondays and Fridays. Recheck in one week.   Current Anticoagulation Instructions: INR 2.4  Take 1.5 tabs today, then take 1.5 tabs each Monday and Friday and 1 tab on all other days.  Recheck in 14 days.

## 2010-02-19 NOTE — Medication Information (Signed)
Summary: ccr  Anticoagulant Therapy  Managed by: Bethena Midget, RN, BSN Referring MD: Sharrell Ku Supervising MD: Gala Romney MD, Reuel Boom Indication 1: Mitral Valve Replacement (ICD-V43.3) Indication 2: Mitral Valve Disorder (ICD-424.0) Lab Used: [facility] Site: [facility] INR POC 1.7 INR RANGE 2.5 - 3.5  Dietary changes: no    Health status changes: no    Bleeding/hemorrhagic complications: no    Recent/future hospitalizations: no    Any changes in medication regimen? no    Recent/future dental: no  Any missed doses?: no       Is patient compliant with meds? yes       Current Medications (verified): 1)  Diltiazem Hcl Coated Beads 180 Mg Xr24h-Cap (Diltiazem Hcl Coated Beads) .... Take 1 Capsule By Mouth Once A Day 2)  Lanoxin 0.125 Mg Tabs (Digoxin) .... Take 1 Tablet By Mouth Once A Day 3)  Metoprolol Tartrate 50 Mg Tabs (Metoprolol Tartrate) .... Take 1 1/2 Tablet By Mouth Twice A Day 4)  Caltrate 600+d Plus 600-400 Mg-Unit Tabs (Calcium Carbonate-Vit D-Min) .... Take 1 Tablet By Mouth Twice A Day 5)  Boniva 3 Mg/59ml Kit (Ibandronate Sodium) .... Take 1 By Mouth Monthly 6)  Hydrochlorothiazide 25 Mg Tabs (Hydrochlorothiazide) .... Take 1/2 By Mouth Once Daily 7)  Amoxicillin 500 Mg Caps (Amoxicillin) .... Take 4 Caps One Hour Prior To Dental Work. 8)  Coumadin 5 Mg Tabs (Warfarin Sodium) .... Take As Directed By Coumadin Clinic. 9)  Vitamin D 200 Unit Tabs (Cholecalciferol) .... Take 1  Tablet Daily 10)  Align  Caps (Probiotic Product) .Marland Kitchen.. 1 Cap Once Daily 11)  Protonix 40 Mg Tbec (Pantoprazole Sodium) .... Take 1 Tablet Once Daily 12)  Enablex 15 Mg Xr24h-Tab (Darifenacin Hydrobromide) .... Take 1 Tablet By Mouth Once A Day 13)  Boniva  Allergies: 1)  ! * Nexium  Anticoagulation Management History:      The patient is taking warfarin and comes in today for a routine follow up visit.  Positive risk factors for bleeding include an age of 68 years or older.  The  bleeding index is 'intermediate risk'.  Positive CHADS2 values include History of HTN.  Negative CHADS2 values include Age > 66 years old.  The start date was 05/19/1997.  Anticoagulation responsible provider: Bensimhon MD, Reuel Boom.  INR POC: 1.7.  Cuvette Lot#: [id number].  Exp: 04/2010.    Anticoagulation Management Assessment/Plan:      The patient's current anticoagulation dose is Coumadin 5 mg tabs: Take as directed by coumadin clinic..  The target INR is 2.5 - 3.5.  The next INR is due 02/23/2009.  Anticoagulation instructions were given to patient.  Results were reviewed/authorized by Bethena Midget, RN, BSN.  She was notified by Bethena Midget, RN, BSN.         Prior Anticoagulation Instructions: INR 1.9  Increase to 2 tabs today and tomorrow. Continue lovenox two times a day until Wednesday recheck.    Called Alverda Skeans [phone] with [facility] to see if she will draw on 02/07/2009.    Current Anticoagulation Instructions: INR 1.7 Take 1 1/2 pill today, Saturday take 1 1/2 pills then change dose to  1 pill everyday except 1 1/2 pills on Mondays and Fridays. Recheck in one week.

## 2010-02-19 NOTE — Medication Information (Signed)
Summary: rov.mp  Anticoagulant Therapy  Managed by: Bethena Midget, RN, BSN Referring MD: Sharrell Ku Supervising MD: Jens Som MD, Arlys John Indication 1: Mitral Valve Replacement (ICD-V43.3) Indication 2: Mitral Valve Disorder (ICD-424.0) Lab Used: [facility] Site: [address] PT 22.2 INR POC 3.4 INR RANGE 2.5 - 3.5  Dietary changes: no    Health status changes: no    Bleeding/hemorrhagic complications: no    Recent/future hospitalizations: no    Any changes in medication regimen? yes       Details: Boniva now taking once a month   Recent/future dental: no  Any missed doses?: no       Is patient compliant with meds? yes       Current Medications (verified): 1)  Diltiazem Hcl Coated Beads 180 Mg Xr24h-Cap (Diltiazem Hcl Coated Beads) .... Take 1 Capsule By Mouth Once A Day 2)  Lanoxin 0.125 Mg Tabs (Digoxin) .... Take 1 Tablet By Mouth Once A Day 3)  Metoprolol Tartrate 50 Mg Tabs (Metoprolol Tartrate) .... Take 1 1/2 Tablet By Mouth Twice A Day 4)  Caltrate 600+d Plus 600-400 Mg-Unit Tabs (Calcium Carbonate-Vit D-Min) .... Take 1 Tablet By Mouth Twice A Day 5)  Boniva 3 Mg/40ml Kit (Ibandronate Sodium) .... Take 1 By Mouth Monthly 6)  Hydrochlorothiazide 25 Mg Tabs (Hydrochlorothiazide) .... Take 1/2 By Mouth Once Daily 7)  Protonix 40 Mg Tbec (Pantoprazole Sodium) .... Take 1 By Mouth Once Daily 8)  Amoxicillin 500 Mg Caps (Amoxicillin) .... As Needed For Dental Appt. 9)  Coumadin 5 Mg Tabs (Warfarin Sodium) .... Sunday - 1 Tab, Monday - 1.5 Tabs, Tuesday - 1 Tab, Wednesday - 1 Tab, Thursday - 1 Tab, Friday - 1.5 Tabs, Saturday - 1 Tab 10)  Vitamin D 200 Unit Tabs (Cholecalciferol) .... Take 1  Tablet Daily  Allergies (verified): No Known Drug Allergies  Anticoagulation Management History:      The patient is taking warfarin and comes in today for a routine follow up visit.  Positive risk factors for bleeding include an age of 55 years or older.  The bleeding index is  'intermediate risk'.  Positive CHADS2 values include History of HTN.  Negative CHADS2 values include Age > 26 years old.  The start date was 05/19/1997.  Prothrombin time is 22.2.  Anticoagulation responsible provider: Jens Som MD, Arlys John.  INR POC: 3.4.  Cuvette Lot#: [id number].  Exp: 07/2009.    Anticoagulation Management Assessment/Plan:      The patient's current anticoagulation dose is Coumadin 5 mg tabs: Sunday - 1 tab, Monday - 1.5 tabs, Tuesday - 1 tab, Wednesday - 1 tab, Thursday - 1 tab, Friday - 1.5 tabs, Saturday - 1 tab.  The target INR is 2.5 - 3.5.  The next INR is due 09/15/2008.  Anticoagulation instructions were given to patient.  Results were reviewed/authorized by Bethena Midget, RN, BSN.  She was notified by Bethena Midget, RN, BSN.         Prior Anticoagulation Instructions: Coumadin 5 mg tabs: Sunday - 1 tab, Monday - 1.5 tabs, Tuesday - 1 tab, Wednesday - 1 tab, Thursday - 1 tab, Friday - 1.5 tabs, Saturday - 1 tab.    Current Anticoagulation Instructions: Same dose

## 2010-02-19 NOTE — Medication Information (Signed)
Summary: rov/ewj  Anticoagulant Therapy  Managed by: Reina Fuse, PharmD Referring MD: Sharrell Ku PCP: DR POLITE  ----- Tami Bode MD: Graciela Husbands MD, Viviann Spare Indication 1: Mitral Valve Replacement (ICD-V43.3) Indication 2: Mitral Valve Disorder (ICD-424.0) Lab Used: [facility] Site: [facility] INR RANGE 2.5 - 3.5  Dietary changes: no    Health status changes: no    Bleeding/hemorrhagic complications: no    Recent/future hospitalizations: no    Any changes in medication regimen? no    Recent/future dental: no  Any missed doses?: no       Is patient compliant with meds? yes       Allergies: 1)  ! * Nexium  Anticoagulation Management History:      The patient is taking warfarin and comes in today for a routine follow up visit.  Positive risk factors for bleeding include an age of 73 years or older.  The bleeding index is 'intermediate risk'.  Positive CHADS2 values include History of HTN.  Negative CHADS2 values include Age > 28 years old.  The start date was 05/19/1997.  Her last INR was 4.8.  Anticoagulation responsible provider: Graciela Husbands MD, Viviann Spare.  Cuvette Lot#: [id number].  Exp: 11/2010.    Anticoagulation Management Assessment/Plan:      The patient's current anticoagulation dose is Coumadin 5 mg tabs: Take as directed by coumadin clinic..  The target INR is 2.5 - 3.5.  The next INR is due 12/20/2009.  Anticoagulation instructions were given to patient.  Results were reviewed/authorized by Reina Fuse, PharmD.  She was notified by Reina Fuse PharmD.         Prior Anticoagulation Instructions: INR 4.3  Skip today's dosage of Coumadin, then resume same dosage 1 tablet daily except 1.5 tablets on Mondays, Wednesdays, and Fridays.  Recheck in 2 weeks.    Current Anticoagulation Instructions: INR 3.7  Do not take Coumadin today, November 8th. Then, continue taking Coumadin 1 tab (5 mg) on Sun, Tues, Thur, Sat and Coumadin 1.5 tabs (7.5 mg) on Mon, Wed, Fri.  Return to  clinic in 3 weeks.

## 2010-02-19 NOTE — Medication Information (Signed)
Summary: rov/sl  Anticoagulant Therapy  Managed by: Weston Brass, PharmD Referring MD: Sharrell Ku PCP: DR POLITE  ----- Tami Bode MD: Jens Som MD, Arlys John Indication 1: Mitral Valve Replacement (ICD-V43.3) Indication 2: Mitral Valve Disorder (ICD-424.0) Lab Used: [facility] Site: [facility] INR POC 3.4 INR RANGE 2.5 - 3.5  Dietary changes: no    Health status changes: no    Bleeding/hemorrhagic complications: yes       Details: had some BRB from rectum.  Has hx of hemrroids.  Resolved now.   Recent/future hospitalizations: no    Any changes in medication regimen? no    Recent/future dental: no  Any missed doses?: no       Is patient compliant with meds? yes       Allergies: 1)  ! * Nexium  Anticoagulation Management History:      The patient is taking warfarin and comes in today for a routine follow up visit.  Positive risk factors for bleeding include an age of 73 years or older.  The bleeding index is 'intermediate risk'.  Positive CHADS2 values include History of HTN.  Negative CHADS2 values include Age > 73 years old.  The start date was 05/19/1997.  Her last INR was 4.8.  Anticoagulation responsible provider: Jens Som MD, Arlys John.  INR POC: 3.4.  Cuvette Lot#: [id number].  Exp: 12/2010.    Anticoagulation Management Assessment/Plan:      The patient's current anticoagulation dose is Coumadin 5 mg tabs: Take as directed by coumadin clinic..  The target INR is 2.5 - 3.5.  The next INR is due 01/22/2010.  Anticoagulation instructions were given to patient.  Results were reviewed/authorized by Weston Brass, PharmD.  She was notified by Weston Brass PharmD.         Prior Anticoagulation Instructions: INR 3.7  Do not take Coumadin today, November 8th. Then, continue taking Coumadin 1 tab (5 mg) on Sun, Tues, Thur, Sat and Coumadin 1.5 tabs (7.5 mg) on Mon, Wed, Fri.  Return to clinic in 3 weeks.    Current Anticoagulation Instructions: INR 3.4  Continue same dose of  1 tablet every day except 1 1/2 tablets on Monday, Wednesday and Friday.  Recheck INR in 4 weeks.

## 2010-02-19 NOTE — Medication Information (Signed)
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 yes       Details: switched from nexium to protonix  Recent/future dental: no  Any missed doses?: no       Is patient compliant with meds? yes       Current Medications (verified): 1)  Diltiazem Hcl Coated Beads 180 Mg Xr24h-Cap (Diltiazem Hcl Coated Beads) .... Take 1 Capsule By Mouth Once A Day 2)  Lanoxin 0.125 Mg Tabs (Digoxin) .... Take 1 Tablet By Mouth Once A Day 3)  Metoprolol Tartrate 50 Mg Tabs (Metoprolol Tartrate) .... Take 1 1/2 Tablet By Mouth Twice A Day 4)  Caltrate 600+d Plus 600-400 Mg-Unit Tabs (Calcium Carbonate-Vit D-Min) .... Take 1 Tablet By Mouth Twice A Day 5)  Boniva 3 Mg/39ml Kit (Ibandronate Sodium) .... Take 1 By Mouth Monthly 6)  Hydrochlorothiazide 25 Mg Tabs (Hydrochlorothiazide) .... Take 1/2 By Mouth Once Daily 7)  Amoxicillin 500 Mg Caps (Amoxicillin) .... As Needed For Dental Appt. 8)  Coumadin 5 Mg Tabs (Warfarin Sodium) .... Sunday - 1 Tab, Monday - 1.5 Tabs, Tuesday - 1 Tab, Wednesday - 1 Tab, Thursday - 1 Tab, Friday - 1.5 Tabs, Saturday - 1 Tab 9)  Vitamin D 200 Unit Tabs (Cholecalciferol) .... Take 1  Tablet Daily 10)  Align  Caps (Probiotic Product) .Marland Kitchen.. 1 Cap Once Daily 11)  Protonix 40 Mg Tbec (Pantoprazole Sodium) .... Take 1 Tablet Once Daily  Allergies (verified): 1)  ! * Nexium  Anticoagulation Management History:      The patient is taking warfarin and comes in today for a routine follow up visit.  Positive risk factors for bleeding include an age of 3 years  or older.  The bleeding index is 'intermediate risk'.  Positive CHADS2 values include History of HTN.  Negative CHADS2 values include Age > 57 years old.  The start date was 05/19/1997.  Anticoagulation responsible provider: Tenny Craw MD, Gunnar Fusi.  INR POC: 2.7.  Cuvette Lot#: [id number].  Exp: 11/2009.    Anticoagulation Management Assessment/Plan:      The patient's current anticoagulation dose is Coumadin 5 mg tabs: Sunday - 1 tab, Monday - 1.5 tabs, Tuesday - 1 tab, Wednesday - 1 tab, Thursday - 1 tab, Friday - 1.5 tabs, Saturday - 1 tab.  The target INR is 2.5 - 3.5.  The next INR is due 11/10/2008.  Anticoagulation instructions were given to patient.  Results were reviewed/authorized by Weston Brass, PharmD.  She was notified by Massie Bougie, PharmD Cand..         Prior Anticoagulation Instructions: INR 3.2. Take 1 tablet daily except 1.5 tablets Mon and Fri.  Current Anticoagulation Instructions: INR 2.7  The patient is to continue with the same dose of coumadin.  This dosage includes:

## 2010-02-19 NOTE — Progress Notes (Signed)
Summary: INR Results  Phone Note From Other Clinic Call back at [phone]   Caller: Amy/[facility] Summary of Call: INR 1.5 W/ 15.1 secs  Initial call taken by: Migdalia Dk,  February 06, 2009 8:50 AM  Follow-up for Phone Call        Called back to Dr. Glade Stanford for RN to call back.Hartford Poli  D/W Dr. Marge Duncans RN at [phone] as patient came back to office after leaving [facility].  Dr. Jeralene Peters saw patient and sent patient to ER for EVAL.  Pt went over to ER, Did not stay b/c of long wait.  Went back to Dr. Alisia Ferrari office who drew H/H and again advised patient to return to ER.    I spent 25 minutes with patient (T.Muse and E. Johnson present) during which we discussed potential dangers of not returning to ER. Pt (at end of discussion) states she will go home, contact daughter, then return with daughter to ER.    Called to Alverda Skeans, [facility], and passed along this information, including patient and Dr. Alisia Ferrari RN discussion that he instructed pt to avoid use of lovenox and warfarin this evening and in the morning until after follow-up appt with Dr. Bosie Clos.  Amy stated she would follow-up with patient in the am and call us back.    Precautions given to patient.  Follow-up by: Shelby Dubin PharmD, BCPS, CPP,  February 06, 2009 3:49 PM

## 2010-02-19 NOTE — Medication Information (Signed)
Summary: rov/tm  Anticoagulant Therapy  Managed by: Lynann Bologna, PharmD Referring MD: Sharrell Ku Supervising MD: Clifton James MD, Cristal Deer Indication 1: Mitral Valve Replacement (ICD-V43.3) Indication 2: Mitral Valve Disorder (ICD-424.0) Lab Used: [facility] Site: [facility] INR POC 2.4 INR RANGE 2.5 - 3.5  Dietary changes: no    Health status changes: no    Bleeding/hemorrhagic complications: no    Recent/future hospitalizations: no    Any changes in medication regimen? no    Recent/future dental: no  Any missed doses?: no       Is patient compliant with meds? yes       Current Medications (verified): 1)  Diltiazem Hcl Coated Beads 180 Mg Xr24h-Cap (Diltiazem Hcl Coated Beads) .... Take 1 Capsule By Mouth Once A Day 2)  Lanoxin 0.125 Mg Tabs (Digoxin) .... Take 1 Tablet By Mouth Once A Day 3)  Metoprolol Tartrate 50 Mg Tabs (Metoprolol Tartrate) .... Take 1 1/2 Tablet By Mouth Twice A Day 4)  Caltrate 600+d Plus 600-400 Mg-Unit Tabs (Calcium Carbonate-Vit D-Min) .... Take 1 Tablet By Mouth Twice A Day 5)  Boniva 3 Mg/32ml Kit (Ibandronate Sodium) .... Take 1 By Mouth Monthly 6)  Hydrochlorothiazide 25 Mg Tabs (Hydrochlorothiazide) .... Take 1/2 By Mouth Once Daily 7)  Amoxicillin 500 Mg Caps (Amoxicillin) .... Take 4 Caps One Hour Prior To Dental Work. 8)  Coumadin 5 Mg Tabs (Warfarin Sodium) .... Take As Directed By Coumadin Clinic. 9)  Vitamin D 200 Unit Tabs (Cholecalciferol) .... Take 1  Tablet Daily 10)  Protonix 40 Mg Tbec (Pantoprazole Sodium) .... Take 1 Tablet Once Daily 11)  Enablex 15 Mg Xr24h-Tab (Darifenacin Hydrobromide) .... Take 1 Tablet By Mouth Once A Day 12)  Boniva  Allergies (verified): 1)  ! * Nexium  Anticoagulation Management History:      The patient is taking warfarin and comes in today for a routine follow up visit.  Positive risk factors for bleeding include an age of 73 years or older.  The bleeding index is 'intermediate risk'.   Positive CHADS2 values include History of HTN.  Negative CHADS2 values include Age > 73 years old.  The start date was 05/19/1997.  Anticoagulation responsible provider: Clifton James MD, Cristal Deer.  INR POC: 2.4.  Cuvette Lot#: [id number].  Exp: 05/2010.    Anticoagulation Management Assessment/Plan:      The patient's current anticoagulation dose is Coumadin 5 mg tabs: Take as directed by coumadin clinic..  The target INR is 2.5 - 3.5.  The next INR is due 05/07/2009.  Anticoagulation instructions were given to patient.  Results were reviewed/authorized by Lynann Bologna, PharmD.  She was notified by Lynann Bologna.         Prior Anticoagulation Instructions: INR 3.1 Continue 5mg s daily except 7.5mg s on Mondays and Fridays. Recheck in 3 weeks.   Current Anticoagulation Instructions: INR 2.4   Take 2 tablets today. Then start 1 tablet daily (5 mg), except 1.5 tabs on Mondays, Wednesdays, and Fridays.   Next INR in 2 weeks: Monday, April 18th

## 2010-02-19 NOTE — Progress Notes (Signed)
Summary: REFILL MEDS-Digoxin to [facility]  Phone Note Refill Request Call back at Home Phone [phone]   Refills Requested: Medication #1:  LANOXIN 0.125 MG TABS Take 1 tablet by mouth once a day Initial call taken by: Lorne Skeens,  May 08, 2008 2:38 PM Caller: Patient Reason for Call: Refill Medication, Talk to Nurse Summary of Call: LANOXIN 125 MG [facility] [phone], CALL FRIIDAY , PT IS OUT..       Prescriptions: LANOXIN 0.125 MG TABS (DIGOXIN) Take 1 tablet by mouth once a day  #31 x 6   Entered by:   Flonnie Overman   Authorized by:   Laren Boom, MD, [facility]   Signed by:   Flonnie Overman on 05/09/2008   Method used:   Electronically to        [facility] [address] [facility]* (retail)       [address]       [place], [place]  [address]       Ph: [phone]       Fax: [phone]   RxID:   [id number]

## 2010-02-19 NOTE — Letter (Signed)
Summary: Architectural technologist - Walk-In Forms  [facility] - Walk-In Forms   Imported By: Marylou Mccoy 05/04/2009 13:38:49  _____________________________________________________________________  External Attachment:    Type:   Image     Comment:   External Document

## 2010-02-19 NOTE — Assessment & Plan Note (Signed)
Summary: 1 YR F/U   Visit Type:  Follow-up Primary Provider:  DR POLITE  ----- EAGEL   History of Present Illness: [name] returns today for followup.  She is a pleasant 75 yo woman with a h/o mitral valve disease, s/p replacement with chronic atrial fibrillation, and chronic CHF which has  been mostly class 2.  She also has a h/o NSVT which was initially treated with amiodarone which was stopped several yrs ago and her VT has been quiescent.  Her LV function has normalized.  She returns today for additional evaluation.  In the interim, she denies c/p or sob.  She's had no syncope. She has had a colonoscopy but denies bleeding. No peripheral edema.  Current Medications (verified): 1)  Diltiazem Hcl Coated Beads 180 Mg Xr24h-Cap (Diltiazem Hcl Coated Beads) .... Take 1 Capsule By Mouth Once A Day 2)  Lanoxin 0.125 Mg Tabs (Digoxin) .... Take 1 Tablet By Mouth Once A Day 3)  Metoprolol Tartrate 50 Mg Tabs (Metoprolol Tartrate) .... Take 1 1/2 Tablet By Mouth Twice A Day 4)  Caltrate 600+d Plus 600-400 Mg-Unit Tabs (Calcium Carbonate-Vit D-Min) .... Take 1 Tablet By Mouth Twice A Day 5)  Boniva 3 Mg/52ml Kit (Ibandronate Sodium) .... Take 1 By Mouth Monthly 6)  Hydrochlorothiazide 25 Mg Tabs (Hydrochlorothiazide) .... Take 1/2 By Mouth Once Daily 7)  Amoxicillin 500 Mg Caps (Amoxicillin) .... Take 4 Caps One Hour Prior To Dental Work. 8)  Coumadin 5 Mg Tabs (Warfarin Sodium) .... Take As Directed By Coumadin Clinic. 9)  Vitamin D 200 Unit Tabs (Cholecalciferol) .... Take 1  Tablet Daily 10)  Protonix 40 Mg Tbec (Pantoprazole Sodium) .... Take 1 Tablet Once Daily 11)  Enablex 15 Mg Xr24h-Tab (Darifenacin Hydrobromide) .... Take 1 Tablet By Mouth Once A Day 12)  Boniva  Allergies (verified): 1)  ! * Nexium  Past History:  Past Medical History: Last updated: 02/16/2008 Hgb Electrophoresis wnl (7/06), -hosp. for syncope 7/00 (see procedures),  -hx of colon polyp removed `98- f/u q76yrs,  mod severe sensorineural hearing loss bilat- ENT, - nonsustained V-tach, -severe mitral valve dz. S/P [facility] 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 with no malignancy 1/08 remote hx dilated cardiomyopathy now resolved RBBB  Past Surgical History: Last updated: 03/19/2006 1) MVR `87 -, 10) 2DECHO - nl EF and normal MV - 07/31/2004, 2) Cholecystectomy 7/94 -, 3) Cardiolyte 7/00: EF 43%; neg ischemia -, 4) tilt table 7/00 no syncope, arrhythmia -, 5) carotid Dopplers 7/00: negative -, 6) BTL -, 7) neg skin prick & intradermal testing -, 8) spirometry 8/00: FVC (64% pred.); FEV1 (64%); FEV1/FVC (79%); FEF25-75 (53%) -, 9) Colonoscopy 6/03 mild divertilosis and hemorrhoids - 08/05/2001  Review of Systems  The patient denies chest pain, syncope, dyspnea on exertion, and peripheral edema.    Vital Signs:  Patient profile:   75 year old female Height:      68 inches Weight:      176 pounds BMI:     26.86 Pulse rate:   70 / minute BP sitting:   134 / 84  (left arm) Cuff size:   regular  Vitals Entered By: Burnett Kanaris, CNA (October 29, 2009 4:20 PM)  Physical Exam  General:  Well developed, well nourished, in no acute distress. Head:  normocephalic and atraumatic Eyes:  PERRLA/EOM intact; conjunctiva and lids normal. Mouth:  Teeth, gums and palate normal. Oral mucosa normal. Neck:  Neck  supple, no JVD. No masses, thyromegaly or abnormal cervical nodes. Lungs:  Clear bilaterally to auscultation with no wheezes, rales, or rhonchi. Heart:  IRIR with mechanical S1 and split S2.  Soft systolic murmur at the base.  PMI is not enlarged or laterally displaced. Abdomen:  Bowel sounds positive; abdomen soft and non-tender without masses, organomegaly, or hernias noted. No hepatosplenomegaly. Msk:  Back normal, normal gait. Muscle strength and tone normal. Pulses:  pulses normal in all 4 extremities Extremities:  No  clubbing or cyanosis. Neurologic:  Alert and oriented x 3.   EKG  Procedure date:  10/29/2009  Findings:      Atrial fibrillation with a controlled ventricular response rate of:69.  Right bundle branch block.    Impression & Recommendations:  Problem # 1:  VENTRICULAR TACHYCARDIA (ICD-427.1) Her symptoms have been quiescent. Continue current meds. Her updated medication list for this problem includes:    Diltiazem Hcl Coated Beads 180 Mg Xr24h-cap (Diltiazem hcl coated beads) .Marland Kitchen... Take 1 capsule by mouth once a day    Metoprolol Tartrate 50 Mg Tabs (Metoprolol tartrate) .Marland Kitchen... Take 1 1/2 tablet by mouth twice a day    Coumadin 5 Mg Tabs (Warfarin sodium) .Marland Kitchen... Take as directed by coumadin clinic.  Problem # 2:  ATRIAL FIBRILLATION (ICD-427.31) Her rate is well controlled. Continue rate control as below.   Her updated medication list for this problem includes:    Lanoxin 0.125 Mg Tabs (Digoxin) .Marland Kitchen... Take 1 tablet by mouth once a day    Metoprolol Tartrate 50 Mg Tabs (Metoprolol tartrate) .Marland Kitchen... Take 1 1/2 tablet by mouth twice a day    Coumadin 5 Mg Tabs (Warfarin sodium) .Marland Kitchen... Take as directed by coumadin clinic.  Problem # 3:  HYPERTENSION, BENIGN SYSTEMIC (ICD-401.1) Her blood pressure has been well controlled. Continue meds as below. Her updated medication list for this problem includes:    Diltiazem Hcl Coated Beads 180 Mg Xr24h-cap (Diltiazem hcl coated beads) .Marland Kitchen... Take 1 capsule by mouth once a day    Metoprolol Tartrate 50 Mg Tabs (Metoprolol tartrate) .Marland Kitchen... Take 1 1/2 tablet by mouth twice a day    Hydrochlorothiazide 25 Mg Tabs (Hydrochlorothiazide) .Marland Kitchen... Take 1/2 by mouth once daily  Patient Instructions: 1)  Your physician wants you to follow-up in: 12 months with Dr Court Joy will receive a reminder letter in the mail two months in advance. If you don't receive a letter, please call our office to schedule the follow-up appointment. Prescriptions: COUMADIN 5 MG  TABS (WARFARIN SODIUM) Take as directed by coumadin clinic.  #45 Each x 3   Entered by:   Dennis Bast, RN, BSN   Authorized by:   Laren Boom, MD, [facility]   Signed by:   Dennis Bast, RN, BSN on 10/29/2009   Method used:   Electronically to        [facility]  [id number] (retail)       [address]       [place], [place]  [address]       Ph: [phone] or [phone]       Fax: [phone]   RxID:   [id number]

## 2010-02-19 NOTE — Medication Information (Signed)
Summary: rov/mlw  Anticoagulant Therapy  Managed by: Corrie Dandy borgerding pharmD Referring MD: Sharrell Ku Supervising MD: Juanda Chance MD,Rolin Schult Indication 1: Mitral Valve Replacement (ICD-V43.3) Indication 2: Mitral Valve Disorder (ICD-424.0) Lab Used: [facility] Site: [facility] INR POC 2.5 INR RANGE 2.5 - 3.5  Dietary changes: yes       Details: has eatin a little more greens this week  Health status changes: no    Bleeding/hemorrhagic complications: no    Recent/future hospitalizations: no    Any changes in medication regimen? no    Recent/future dental: no  Any missed doses?: no       Is patient compliant with meds? yes       Medications Prior to Update: 1)  Diltiazem Hcl Coated Beads 180 Mg Xr24h-Cap (Diltiazem Hcl Coated Beads) .... Take 1 Capsule By Mouth Once A Day 2)  Lanoxin 0.125 Mg Tabs (Digoxin) .... Take 1 Tablet By Mouth Once A Day 3)  Metoprolol Tartrate 50 Mg Tabs (Metoprolol Tartrate) .... Take 1 1/2 Tablet By Mouth Twice A Day 4)  Caltrate 600+d Plus 600-400 Mg-Unit Tabs (Calcium Carbonate-Vit D-Min) .... Take 1 Tablet By Mouth Twice A Day 5)  Boniva 3 Mg/28ml Kit (Ibandronate Sodium) .... Take 1 By Mouth Monthly 6)  Hydrochlorothiazide 25 Mg Tabs (Hydrochlorothiazide) .... Take 1/2 By Mouth Once Daily 7)  Amoxicillin 500 Mg Caps (Amoxicillin) .... Take 4 Caps One Hour Prior To Dental Work. 8)  Coumadin 5 Mg Tabs (Warfarin Sodium) .... Take As Directed By Coumadin Clinic. 9)  Vitamin D 200 Unit Tabs (Cholecalciferol) .... Take 1  Tablet Daily 10)  Protonix 40 Mg Tbec (Pantoprazole Sodium) .... Take 1 Tablet Once Daily 11)  Enablex 15 Mg Xr24h-Tab (Darifenacin Hydrobromide) .... Take 1 Tablet By Mouth Once A Day 12)  Boniva  Current Medications (verified): 1)  Diltiazem Hcl Coated Beads 180 Mg Xr24h-Cap (Diltiazem Hcl Coated Beads) .... Take 1 Capsule By Mouth Once A Day 2)  Lanoxin 0.125 Mg Tabs (Digoxin) .... Take 1 Tablet By Mouth Once A Day 3)  Metoprolol  Tartrate 50 Mg Tabs (Metoprolol Tartrate) .... Take 1 1/2 Tablet By Mouth Twice A Day 4)  Caltrate 600+d Plus 600-400 Mg-Unit Tabs (Calcium Carbonate-Vit D-Min) .... Take 1 Tablet By Mouth Twice A Day 5)  Boniva 3 Mg/72ml Kit (Ibandronate Sodium) .... Take 1 By Mouth Monthly 6)  Hydrochlorothiazide 25 Mg Tabs (Hydrochlorothiazide) .... Take 1/2 By Mouth Once Daily 7)  Amoxicillin 500 Mg Caps (Amoxicillin) .... Take 4 Caps One Hour Prior To Dental Work. 8)  Coumadin 5 Mg Tabs (Warfarin Sodium) .... Take As Directed By Coumadin Clinic. 9)  Vitamin D 200 Unit Tabs (Cholecalciferol) .... Take 1  Tablet Daily 10)  Protonix 40 Mg Tbec (Pantoprazole Sodium) .... Take 1 Tablet Once Daily 11)  Enablex 15 Mg Xr24h-Tab (Darifenacin Hydrobromide) .... Take 1 Tablet By Mouth Once A Day 12)  Boniva  Allergies (verified): 1)  ! * Nexium  Anticoagulation Management History:      Positive risk factors for bleeding include an age of 41 years or older.  The bleeding index is 'intermediate risk'.  Positive CHADS2 values include History of HTN.  Negative CHADS2 values include Age > 80 years old.  The start date was 05/19/1997.  Today's INR is 2.5.  Anticoagulation responsible provider: Juanda Chance MD,Dalesha Stanback.  INR POC: 2.5.  Cuvette Lot#: [id number].  Exp: 06/2010.    Anticoagulation Management Assessment/Plan:      The patient's current anticoagulation dose is Coumadin  5 mg tabs: Take as directed by coumadin clinic..  The target INR is 2.5 - 3.5.  The next INR is due 06/04/2009.  Anticoagulation instructions were given to patient.  Results were reviewed/authorized by mary borgerding pharmD.  She was notified by Loma Newton.         Prior Anticoagulation Instructions: INR 2.4   Take 2 tablets today. Then start 1 tablet daily (5 mg), except 1.5 tabs on Mondays, Wednesdays, and Fridays.   Next INR in 2 weeks: Monday, April 18th         Current Anticoagulation Instructions: INR= 2.5 (goal = 2.5-3.5)  The  patient is to continue with the same dose of coumadin.  This dosage includes: 1 tablet all days except for MWF take 1.5 tablets.

## 2010-02-19 NOTE — Progress Notes (Signed)
Summary: refill  Phone Note Refill Request   Refills Requested: Medication #1:  METOPROLOL TARTRATE 50 MG TABS Take 1 1/2 tablet by mouth twice a day   Supply Requested: 3 months [facility] [phone] please call pt once sent... [phone]   Method Requested: Electronic Initial call taken by: Migdalia Dk,  September 28, 2008 4:42 PM    Prescriptions: METOPROLOL TARTRATE 50 MG TABS (METOPROLOL TARTRATE) Take 1 1/2 tablet by mouth twice a day  #180 x 3   Entered by:   Flonnie Overman   Authorized by:   Laren Boom, MD, [facility]   Signed by:   Flonnie Overman on 09/29/2008   Method used:   Electronically to        [facility] [phone]* (retail)       [address]       [address]       [place], [place]  [address]       Ph: [phone]       Fax: [phone]   RxID:   [id number]

## 2010-02-19 NOTE — Medication Information (Signed)
Summary: rov.mp  Anticoagulant Therapy  Managed by: Cloyde Reams, RN, BSN Referring MD: Sharrell Ku Supervising MD: Myrtis Ser MD, Tinnie Gens Indication 1: Mitral Valve Replacement (ICD-V43.3) Indication 2: Mitral Valve Disorder (ICD-424.0) Lab Used: [facility] Site: [facility] INR POC 2.3 INR RANGE 2.5 - 3.5  Dietary changes: no    Health status changes: no    Bleeding/hemorrhagic complications: no    Recent/future hospitalizations: no    Any changes in medication regimen? no    Recent/future dental: no  Any missed doses?: no       Is patient compliant with meds? yes      Comments: Off Lovenox.  Allergies (verified): 1)  ! * Nexium  Anticoagulation Management History:      The patient is taking warfarin and comes in today for a routine follow up visit.  Positive risk factors for bleeding include an age of 73 years or older.  The bleeding index is 'intermediate risk'.  Positive CHADS2 values include History of HTN.  Negative CHADS2 values include Age > 33 years old.  The start date was 05/19/1997.  Anticoagulation responsible provider: Myrtis Ser MD, Tinnie Gens.  INR POC: 2.3.  Cuvette Lot#: [id number].  Exp: 04/2010.    Anticoagulation Management Assessment/Plan:      The patient's current anticoagulation dose is Coumadin 5 mg tabs: Take as directed by coumadin clinic..  The target INR is 2.5 - 3.5.  The next INR is due 03/29/2009.  Anticoagulation instructions were given to patient.  Results were reviewed/authorized by Cloyde Reams, RN, BSN.  She was notified by Cloyde Reams RN.         Prior Anticoagulation Instructions: INR 2.4  Take 1.5 tabs today, then take 1.5 tabs each Monday and Friday and 1 tab on all other days.  Recheck in 14 days.   Current Anticoagulation Instructions: INR 2.3  Take 1.5 tablets today then resume same dosage 1 tablet daily except 1.5 tablets on Mondays and Fridays.  Recheck in 3 weeks.

## 2010-02-19 NOTE — Medication Information (Signed)
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 yes       Details: Pt is going to begin taking Fish Oil starting today.   Recent/future dental: no  Any missed doses?: no       Is patient compliant with meds? yes       Allergies: 1)  ! * Nexium  Anticoagulation Management History:      The patient is taking warfarin and comes in today for a routine follow up visit.  Positive risk factors for bleeding include an age of 41 years or older.  The bleeding index is 'intermediate risk'.  Positive CHADS2 values include History of HTN.  Negative CHADS2 values include Age > 74 years old.  The start date was 05/19/1997.  Her last INR was 2.5 and today's INR is 4.8.  Anticoagulation responsible provider: Jens Som MD, Arlys John.  Cuvette Lot#: [id number].  Exp: 10/2010.    Anticoagulation Management Assessment/Plan:      The patient's current anticoagulation dose is Coumadin 5 mg tabs: Take as directed by coumadin clinic..  The target INR is 2.5 - 3.5.  The next INR is due 09/14/2009.  Anticoagulation instructions were given to patient.  Results were reviewed/authorized by Weston Brass, PharmD.  She was notified by Gweneth Fritter, PharmD Candidate.         Prior Anticoagulation Instructions: INR 2.8  Continue same dose of 1.5 tabs on Monday, Wednesday, and Friday and 1 tab Sunday, Tuesday, Thursday, and Saturday.  Re-check INR in 4 weeks.  Current Anticoagulation Instructions: INR 4.8  Skip today's (Friday) dose.  Take 1/2 tablet (2.5mg ) tomorrow (Saturday).  Then resume normal schedule of  taking 1 tablet (5mg ) on Sundays, Tuesdays, Thursdays, and Saturdays and taking 1.5 tablets (7.5mg ) on Mondays, Wednesdays, and Fridays.  Recheck in 2 weeks.
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
## 2010-02-19 NOTE — Medication Information (Signed)
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 no    Recent/future dental: no  Any missed doses?: no       Is patient compliant with meds? yes       Allergies: 1)  ! * Nexium  Anticoagulation Management History:      The patient is taking warfarin and comes in today for a routine follow up visit.  Positive risk factors for bleeding include an age of 75 years or older.  The bleeding index is 'intermediate risk'.  Positive CHADS2 values include History of HTN.  Negative CHADS2 values include Age > 40 years old.  The start date was 05/19/1997.  Her last INR was 4.8.  Anticoagulation responsible provider: Tenny Craw MD, Gunnar Fusi.  INR POC: 2.8.  Cuvette Lot#: [id number].  Exp: 11/2010.    Anticoagulation Management Assessment/Plan:      The patient's current anticoagulation dose is Coumadin 5 mg tabs: Take as directed by coumadin clinic..  The target INR is 2.5 - 3.5.  The next INR is due 11/12/2009.  Anticoagulation instructions were given to patient.  Results were reviewed/authorized by Weston Brass, PharmD.  She was notified by Harrel Carina, PharmD candidate.         Prior Anticoagulation Instructions: INR 3.3  Continue 1 tablet daily except 1.5 tablets Mon, Wed and Fri.  Return to clinic in 4 weeks.    Current Anticoagulation Instructions: INR 2.8  Continue taking 1 tablet everyday except take 1 1/2 tablets on Mondays, Wednesdays, and Fridays. Re-check INR in 4 weeks.

## 2010-02-19 NOTE — Medication Information (Signed)
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 no    Recent/future dental: no  Any missed doses?: no       Is patient compliant with meds? yes       Allergies: 1)  ! * Nexium  Anticoagulation Management History:      The patient is taking warfarin and comes in today for a routine follow up visit.  Positive risk factors for bleeding include an age of 75 years or older.  The bleeding index is 'intermediate risk'.  Positive CHADS2 values include History of HTN.  Negative CHADS2 values include Age > 73 years old.  The start date was 05/19/1997.  Anticoagulation responsible provider: Myrtis Ser MD, Tinnie Gens.  INR POC: 3.1.  Cuvette Lot#: [id number].  Exp: 05/2010.    Anticoagulation Management Assessment/Plan:      The patient's current anticoagulation dose is Coumadin 5 mg tabs: Take as directed by coumadin clinic..  The target INR is 2.5 - 3.5.  The next INR is due 04/20/2009.  Anticoagulation instructions were given to patient.  Results were reviewed/authorized by Bethena Midget, RN, BSN.  She was notified by Bethena Midget, RN, BSN.         Prior Anticoagulation Instructions: INR 2.3  Take 1.5 tablets today then resume same dosage 1 tablet daily except 1.5 tablets on Mondays and Fridays.  Recheck in 3 weeks.    Current Anticoagulation Instructions: INR 3.1 Continue 5mg s daily except 7.5mg s on Mondays and Fridays. Recheck in 3 weeks.

## 2010-02-19 NOTE — Medication Information (Signed)
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   Health status changes: no    Bleeding/hemorrhagic complications: no    Recent/future hospitalizations: no    Any changes in medication regimen? no    Recent/future dental: no  Any missed doses?: no       Is patient compliant with meds? yes       Allergies: 1)  ! * Nexium  Anticoagulation Management History:      The patient is taking warfarin and comes in today for a routine follow up visit.  Positive risk factors for bleeding include an age of 75 years or older.  The bleeding index is 'intermediate risk'.  Positive CHADS2 values include History of HTN.  Negative CHADS2 values include Age > 73 years old.  The start date was 05/19/1997.  Her last INR was 4.8.  Anticoagulation responsible provider: Tenny Craw MD, Gunnar Fusi.  INR POC: 4.3.  Cuvette Lot#: [id number].  Exp: 11/2010.    Anticoagulation Management Assessment/Plan:      The patient's current anticoagulation dose is Coumadin 5 mg tabs: Take as directed by coumadin clinic..  The target INR is 2.5 - 3.5.  The next INR is due 11/26/2009.  Anticoagulation instructions were given to patient.  Results were reviewed/authorized by Cloyde Reams, RN, BSN.  She was notified by Cloyde Reams RN.         Prior Anticoagulation Instructions: INR 2.8  Continue taking 1 tablet everyday except take 1 1/2 tablets on Mondays, Wednesdays, and Fridays. Re-check INR in 4 weeks.   Current Anticoagulation Instructions: INR 4.3  Skip today's dosage of Coumadin, then resume same dosage 1 tablet daily except 1.5 tablets on Mondays, Wednesdays, and  Fridays.  Recheck in 2 weeks.

## 2010-02-19 NOTE — Progress Notes (Signed)
  Walk in Patient Form Recieved " Patient left DMV papers, forwarded to [facility]  February 06, 2009 1:35 PM

## 2010-02-19 NOTE — Medication Information (Signed)
Summary: rov/jk  Anticoagulant Therapy  Managed by: Weston Brass, PharmD Referring MD: Sharrell Ku Supervising MD: Tenny Craw MD, Gunnar Fusi Indication 1: Mitral Valve Replacement (ICD-V43.3) Indication 2: Mitral Valve Disorder (ICD-424.0) Lab Used: [facility] Site: [facility] INR POC 3.3 INR RANGE 2.5 - 3.5  Dietary changes: no    Health status changes: no    Bleeding/hemorrhagic complications: no    Recent/future hospitalizations: no    Any changes in medication regimen? no    Recent/future dental: no  Any missed doses?: no       Is patient compliant with meds? yes       Allergies: 1)  ! * Nexium  Anticoagulation Management History:      The patient is taking warfarin and comes in today for a routine follow up visit.  Positive risk factors for bleeding include an age of 75 years or older.  The bleeding index is 'intermediate risk'.  Positive CHADS2 values include History of HTN.  Negative CHADS2 values include Age > 44 years old.  The start date was 05/19/1997.  Her last INR was 4.8.  Anticoagulation responsible [name]: Tenny Craw MD, Gunnar Fusi.  INR POC: 3.3.  Cuvette Lot#: [id number].  Exp: 10/2010.    Anticoagulation Management Assessment/Plan:      The patient's current anticoagulation dose is Coumadin 5 mg tabs: Take as directed by coumadin clinic..  The target INR is 2.5 - 3.5.  The next INR is due 10/15/2009.  Anticoagulation instructions were given to patient.  Results were reviewed/authorized by Weston Brass, PharmD.  She was notified by Liana Gerold, PharmD Candidate.         Prior Anticoagulation Instructions: INR 4.8  Skip today's (Friday) dose.  Take 1/2 tablet (2.5mg ) tomorrow (Saturday).  Then resume normal schedule of taking 1 tablet (5mg ) on Sundays, Tuesdays, Thursdays, and Saturdays and taking 1.5 tablets (7.5mg ) on Mondays, Wednesdays, and Fridays.  Recheck in 2 weeks.   Current Anticoagulation Instructions: INR 3.3  Continue 1 tablet daily except 1.5 tablets Mon, Wed  and Fri.  Return to clinic in 4 weeks.

## 2010-02-19 NOTE — Medication Information (Signed)
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  Has resolved.  Pt does have history of hemrroids  Recent/future hospitalizations: no    Any changes in medication regimen? no    Recent/future dental: no  Any missed doses?: no       Is patient compliant with meds? yes       Allergies: 1)  ! * Nexium  Anticoagulation Management History:      The patient is taking warfarin and comes in today for a routine follow up visit.  Positive risk factors for bleeding include an age of 75 years or older.  The bleeding index is 'intermediate risk'.  Positive CHADS2 values include History of HTN.  Negative CHADS2 values include Age > 73 years old.  The start date was 05/19/1997.  Her last INR was 2.5.  Anticoagulation responsible provider: Tenny Craw MD, Gunnar Fusi.  INR POC: 2.9.  Cuvette Lot#: [id number].  Exp: 08/2010.    Anticoagulation Management Assessment/Plan:      The patient's current anticoagulation dose is Coumadin 5 mg tabs: Take as directed by coumadin clinic..  The target INR is 2.5 - 3.5.  The next INR is due 07/02/2009.  Anticoagulation instructions were given to patient.  Results were reviewed/authorized by Weston Brass, PharmD.  She was notified by Weston Brass PharmD.         Prior Anticoagulation Instructions: INR= 2.5 (goal = 2.5-3.5)  The patient is to continue with the same dose of coumadin.  This dosage includes: 1 tablet all days except for MWF take 1.5 tablets.   Current Anticoagulation Instructions: INR 2.9  Continue same dose of 1 tablet every day except 1  1/2 tablets on Monday, Wednesday and Friday

## 2010-02-19 NOTE — Medication Information (Signed)
Summary: rov coumadin -lmc  Anticoagulant Therapy  Managed by: Shelby Dubin, PharmD, BCPS, CPP Referring MD: Sharrell Ku Supervising MD: Jens Som MD, Arlys John Indication 1: Mitral Valve Replacement (ICD-V43.3) Indication 2: Mitral Valve Disorder (ICD-424.0) Lab Used: [facility] Site: [facility] INR POC 1.9 INR RANGE 2.5 - 3.5   Health status changes: yes       Details: Had polyps removed on Tuesday. Was off coumadin for 2 days, then restarted on Thursday along with lovenox  Bleeding/hemorrhagic complications: no    Recent/future hospitalizations: no    Any changes in medication regimen? no    Recent/future dental: no  Any missed doses?: no       Is patient compliant with meds? yes       Allergies (verified): 1)  ! * Nexium  Anticoagulation Management History:      The patient is taking warfarin and comes in today for a routine follow up visit.  Positive risk factors for bleeding include an age of 75 years or older.  The bleeding index is 'intermediate risk'.  Positive CHADS2 values include History of HTN.  Negative CHADS2 values include Age > 27 years old.  The start date was 05/19/1997.  Anticoagulation responsible provider: Jens Som MD, Arlys John.  INR POC: 1.9.  Cuvette Lot#: [id number].  Exp: 04/2010.    Anticoagulation Management Assessment/Plan:      The patient's current anticoagulation dose is Coumadin 5 mg tabs: Take as directed by coumadin clinic..  The target INR is 2.5 - 3.5.  The next INR is due 02/07/2009.  Anticoagulation instructions were given to patient.  Results were reviewed/authorized by Shelby Dubin, PharmD, BCPS, CPP.  She was notified by Shelby Dubin PharmD, BCPS, CPP.         Prior Anticoagulation Instructions: INR 2.6  Continue taking 1.5 tabs on Mondays and Fridays and 1 tab on all other days.  Recheck 10 days after discharge.    Current Anticoagulation Instructions: INR 1.9  Increase to 2 tabs today and tomorrow. Continue lovenox two times a day  until Wednesday recheck.    Called Alverda Skeans [phone] with [facility] to see if she will draw on 02/07/2009.

## 2010-02-19 NOTE — Medication Information (Signed)
Summary: rov/ewj  Anticoagulant Therapy  Managed by: Shelby Dubin, PharmD, BCPS, CPP Referring MD: Sharrell Ku Supervising MD: Tenny Craw MD, Gunnar Fusi Indication 1: Mitral Valve Replacement (ICD-V43.3) Indication 2: Mitral Valve Disorder (ICD-424.0) Lab Used: [facility] Site: [facility] INR POC 2.6 INR RANGE 2.5 - 3.5  Dietary changes: no    Health status changes: no    Bleeding/hemorrhagic complications: no    Recent/future hospitalizations: yes       Details: pending colon next week..Dr. Bosie Clos plans Sunday admit  Any changes in medication regimen? no    Recent/future dental: no  Any missed doses?: no       Is patient compliant with meds? yes       Allergies (verified): 1)  ! * Nexium  Anticoagulation Management History:      The patient is taking warfarin and comes in today for a routine follow up visit.  Positive risk factors for bleeding include an age of 75 years or older.  The bleeding index is 'intermediate risk'.  Positive CHADS2 values include History of HTN.  Negative CHADS2 values include Age > 41 years old.  The start date was 05/19/1997.  Anticoagulation responsible provider: Tenny Craw MD, Gunnar Fusi.  INR POC: 2.6.  Cuvette Lot#: [id number].  Exp: 02/2010.    Anticoagulation Management Assessment/Plan:      The patient's current anticoagulation dose is Coumadin 5 mg tabs: Take as directed by coumadin clinic..  The target INR is 2.5 - 3.5.  The next INR is due 02/08/2009.  Anticoagulation instructions were given to patient.  Results were reviewed/authorized by Shelby Dubin, PharmD, BCPS, CPP.  She was notified by Shelby Dubin PharmD, BCPS, CPP.         Prior Anticoagulation Instructions: INR 1.8  Take 2 tablets today then resume same dosage 1.5 tablets on Mondays and Fridays and 1 tablet all other days.   Recheck in 2 weeks.    Current Anticoagulation Instructions: INR 2.6  Continue taking 1.5 tabs on Mondays and Fridays and 1 tab on all other days.  Recheck 10 days after  discharge.

## 2010-02-19 NOTE — Medication Information (Signed)
Summary: rov/sp  Anticoagulant Therapy  Managed by: Weston Brass, PharmD Referring MD: Sharrell Ku Supervising MD: Eden Emms MD, Theron Arista Indication 1: Mitral Valve Replacement (ICD-V43.3) Indication 2: Mitral Valve Disorder (ICD-424.0) Lab Used: [facility] Site: [facility] INR POC 2.8 INR RANGE 2.5 - 3.5  Dietary changes: no    Health status changes: no    Bleeding/hemorrhagic complications: no    Recent/future hospitalizations: no    Any changes in medication regimen? no    Recent/future dental: no  Any missed doses?: no       Is patient compliant with meds? yes       Allergies: 1)  ! * Nexium  Anticoagulation Management History:      The patient is taking warfarin and comes in today for a routine follow up visit.  Positive risk factors for bleeding include an age of 75 years or older.  The bleeding index is 'intermediate risk'.  Positive CHADS2 values include History of HTN.  Negative CHADS2 values include Age > 67 years old.  The start date was 05/19/1997.  Her last INR was 2.5.  Anticoagulation responsible provider: Eden Emms MD, Theron Arista.  INR POC: 2.8.  Cuvette Lot#: [id number].  Exp: 09/2010.    Anticoagulation Management Assessment/Plan:      The patient's current anticoagulation dose is Coumadin 5 mg tabs: Take as directed by coumadin clinic..  The target INR is 2.5 - 3.5.  The next INR is due 08/31/2009.  Anticoagulation instructions were given to patient.  Results were reviewed/authorized by Weston Brass, PharmD.  She was notified by Dillard Cannon.         Prior Anticoagulation Instructions: INR 3.0  Continue same dose of 1 tablet every day except 1 1/2 tablets on Monday, Wednesday and Friday   Current Anticoagulation Instructions: INR 2.8  Continue same dose of 1.5 tabs on Monday, Wednesday, and Friday and 1 tab Sunday, Tuesday, Thursday, and Saturday.  Re-check INR in 4 weeks.

## 2010-02-21 NOTE — Medication Information (Signed)
Summary: rov/sp  Anticoagulant Therapy  Managed by: Leota Sauers, PharmD, BCPS, CPP Referring MD: Sharrell Ku PCP: DR POLITE  ----- Doylene Bode MD: Antoine Poche MD, Fayrene Fearing Indication 1: Mitral Valve Replacement (ICD-V43.3) Indication 2: Mitral Valve Disorder (ICD-424.0) Lab Used: [facility] Site: [facility] INR POC 3.2 INR RANGE 2.5 - 3.5  Dietary changes: yes       Details: maybe decreasing amt of green vegies due to gi issues  Health status changes: no    Bleeding/hemorrhagic complications: no    Recent/future hospitalizations: no    Any changes in medication regimen? no    Recent/future dental: no  Any missed doses?: no       Is patient compliant with meds? yes       Current Medications (verified): 1)  Diltiazem Hcl Coated Beads 180 Mg Xr24h-Cap (Diltiazem Hcl Coated Beads) .... Take 1 Capsule By Mouth Once A Day 2)  Lanoxin 0.125 Mg Tabs (Digoxin) .... Take 1 Tablet By Mouth Once A Day 3)  Metoprolol Tartrate 50 Mg Tabs (Metoprolol Tartrate) .... Take 1 1/2 Tablet By Mouth Twice A Day 4)  Caltrate 600+d Plus 600-400 Mg-Unit Tabs (Calcium Carbonate-Vit D-Min) .... Take 1 Tablet By Mouth Twice A Day 5)  Boniva 3 Mg/29ml Kit (Ibandronate Sodium) .... Take 1 By Mouth Monthly 6)  Hydrochlorothiazide 25 Mg Tabs (Hydrochlorothiazide) .... Take 1/2 By Mouth Once Daily 7)  Amoxicillin 500 Mg Caps (Amoxicillin) .... Take 4 Caps One Hour Prior To Dental Work. 8)  Coumadin 5 Mg Tabs (Warfarin Sodium) .... Take As Directed By Coumadin Clinic. 9)  Vitamin D 200 Unit Tabs (Cholecalciferol) .... Take 1  Tablet Daily 10)  Protonix 40 Mg Tbec (Pantoprazole Sodium) .... Take 1 Tablet Once Daily 11)  Enablex 15 Mg Xr24h-Tab (Darifenacin Hydrobromide) .... Take 1 Tablet By Mouth Once A Day 12)  Boniva  Allergies (verified): 1)  ! * Nexium  Anticoagulation Management History:      The patient is taking warfarin and comes in today for a routine follow up visit.  Positive risk factors  for bleeding include an age of 95 years or older.  The bleeding index is 'intermediate risk'.  Positive CHADS2 values include History of HTN.  Negative CHADS2 values include Age > 75 years old.  The start date was 05/19/1997.  Her last INR was 4.8.  Anticoagulation responsible provider: Antoine Poche MD, Fayrene Fearing.  INR POC: 3.2.  Cuvette Lot#: [id number].  Exp: 12/2010.    Anticoagulation Management Assessment/Plan:      The patient's current anticoagulation dose is Coumadin 5 mg tabs: Take as directed by coumadin clinic..  The target INR is 2.5 - 3.5.  The next INR is due 02/20/2010.  Anticoagulation instructions were given to patient.  Results were reviewed/authorized by Leota Sauers, PharmD, BCPS, CPP.         Prior Anticoagulation Instructions: INR 3.4  Continue same dose of 1 tablet every day except 1 1/2 tablets on Monday, Wednesday and Friday.  Recheck INR in 4 weeks.   Current Anticoagulation Instructions: INR 3.2  Coumadin 5mg  tabs - take 1 tab each day EXCEPT 1.5 tab on MON, WED, FRI Prescriptions: COUMADIN 5 MG TABS (WARFARIN SODIUM) Take as directed by coumadin clinic.  #135 Each x 3   Entered by:   Louann Sjogren PharmD   Authorized by:   Gaylord Shih, MD, [facility]   Signed by:   Louann Sjogren PharmD on 01/23/2010   Method used:   Electronically to  [facility] [address] [phone]* (retail)       [address]       [place], [place]  [address]       Ph: [phone] or [phone]       Fax: [phone]   RxID:   [id number]

## 2010-02-27 NOTE — Medication Information (Signed)
Summary: rov coumadin - [facility]  Anticoagulant Therapy  Managed by: Weston Brass, PharmD Referring MD: Sharrell Ku PCP: DR POLITE  ----- Doylene Bode MD: Myrtis Ser MD, Tinnie Gens Indication 1: Mitral Valve Replacement (ICD-V43.3) Indication 2: Mitral Valve Disorder (ICD-424.0) Lab Used: [facility] Site: [facility] INR POC 3.7 INR RANGE 2.5 - 3.5  Dietary changes: no    Health status changes: yes       Details: Has been experiencing diarrhea  Bleeding/hemorrhagic complications: no    Recent/future hospitalizations: no    Any changes in medication regimen? yes       Details: Started taking Lomotil  Recent/future dental: no  Any missed doses?: no       Is patient compliant with meds? yes       Allergies: 1)  ! * Nexium  Anticoagulation Management History:      The patient is taking warfarin and comes in today for a routine follow up visit.  Positive risk factors for bleeding include an age of 60 years or older.  The bleeding index is 'intermediate risk'.  Positive CHADS2 values include History of HTN.  Negative CHADS2 values include Age > 85 years old.  The start date was 05/19/1997.  Her last INR was 4.8.  Anticoagulation responsible provider: Myrtis Ser MD, Tinnie Gens.  INR POC: 3.7.  Cuvette Lot#: [id number].  Exp: 12/2010.    Anticoagulation Management Assessment/Plan:      The patient's current anticoagulation dose is Coumadin 5 mg tabs: Take as directed by coumadin clinic..  The target INR is 2.5 - 3.5.  The next INR is due 03/19/2010.  Anticoagulation instructions were given to patient.  Results were reviewed/authorized by Weston Brass, PharmD.  She was notified by Linward Headland, PharmD candidate.         Prior Anticoagulation Instructions: INR 3.2  Coumadin 5mg  tabs - take 1 tab each day EXCEPT 1.5 tab on MON, WED, FRI  Current Anticoagulation Instructions: INR 3.7 (INR goal: 2.5-3.5)  Take 1/2 tablet today then resume normal schedule tomorrow of 1 tablet everyday except 1 and 1/2  tablets on Mondays, Wednesdays, and Fridays.  Recheck in 4 weeks.

## 2010-03-18 ENCOUNTER — Encounter: Payer: Self-pay | Admitting: Internal Medicine

## 2010-03-18 DIAGNOSIS — I4891 Unspecified atrial fibrillation: Secondary | ICD-10-CM

## 2010-03-19 ENCOUNTER — Encounter: Payer: Self-pay | Admitting: Cardiovascular Disease

## 2010-03-19 ENCOUNTER — Encounter (INDEPENDENT_AMBULATORY_CARE_PROVIDER_SITE_OTHER): Payer: Medicare Other

## 2010-03-19 DIAGNOSIS — Z954 Presence of other heart-valve replacement: Secondary | ICD-10-CM

## 2010-03-19 DIAGNOSIS — Z7901 Long term (current) use of anticoagulants: Secondary | ICD-10-CM

## 2010-03-19 DIAGNOSIS — I059 Rheumatic mitral valve disease, unspecified: Secondary | ICD-10-CM

## 2010-03-19 LAB — CONVERTED CEMR LAB: POC INR: 3.8

## 2010-03-28 NOTE — Medication Information (Signed)
Summary: rov/ewj  Anticoagulant Therapy  Managed by: Windell Hummingbird, RN Referring MD: Sharrell Ku PCP: DR POLITE  ----- Doylene Bode MD: Eden Emms MD, Theron Arista Indication 1: Mitral Valve Replacement (ICD-V43.3) Indication 2: Mitral Valve Disorder (ICD-424.0) Lab Used: [facility] Site: [facility] INR POC 3.8 INR RANGE 2.5 - 3.5  Dietary changes: no    Health status changes: no    Bleeding/hemorrhagic complications: no    Recent/future hospitalizations: no    Any changes in medication regimen? no    Recent/future dental: no  Any missed doses?: no       Is patient compliant with meds? yes      Comments: 04/12/10 at 945am Flex Sigm[facility] with Dr Dulce Sellar pt needs to be off for 5 days. Clearance per Joni Reining at Dr Malena Edman office has already been sent to Dr [name].   Allergies: 1)  ! * Nexium  Anticoagulation Management History:      The patient is taking warfarin and comes in today for a routine follow up visit.  Positive risk factors for bleeding include an age of 40 years or older.  The bleeding index is 'intermediate risk'.  Positive CHADS2 values include History of HTN.  Negative CHADS2 values include Age > 21 years old.  The start date was 05/19/1997.  Her last INR was 4.8.  Anticoagulation responsible provider: Eden Emms MD, Theron Arista.  INR POC: 3.8.  Cuvette Lot#: [id number].  Exp: 01/2011.    Anticoagulation Management Assessment/Plan:      The patient's current anticoagulation dose is Coumadin 5 mg tabs: Take as directed by coumadin clinic..  The target INR is 2.5 - 3.5.  The next INR is due 04/02/2010.  Anticoagulation instructions were given to patient.  Results were reviewed/authorized by Windell Hummingbird, RN.  She was notified by Windell Hummingbird, RN.         Prior Anticoagulation Instructions: INR 3.7 (INR goal: 2.5-3.5)  Take 1/2 tablet today then resume normal schedule tomorrow of 1 tablet everyday except 1 and 1/2 tablets on Mondays, Wednesdays, and Fridays.  Recheck in 4  weeks.  Current Anticoagulation Instructions: INR 3.8 Today only take 1/2 tablet.  Then resume taking 1 tablet every day, except take 1 1/2 tablets on Mondays, Wednesdays, and Fridays. Recheck in 2 weeks.

## 2010-04-05 ENCOUNTER — Encounter: Payer: Self-pay | Admitting: Cardiology

## 2010-04-05 ENCOUNTER — Encounter (INDEPENDENT_AMBULATORY_CARE_PROVIDER_SITE_OTHER): Payer: Medicare Other

## 2010-04-05 DIAGNOSIS — Z954 Presence of other heart-valve replacement: Secondary | ICD-10-CM

## 2010-04-05 DIAGNOSIS — I059 Rheumatic mitral valve disease, unspecified: Secondary | ICD-10-CM

## 2010-04-05 LAB — CONVERTED CEMR LAB: POC INR: 3.3

## 2010-04-07 LAB — PROTIME-INR
INR: 1.28 (ref 0.00–1.49)
INR: 1.67 — ABNORMAL HIGH (ref 0.00–1.49)
INR: 2 — ABNORMAL HIGH (ref 0.00–1.49)
INR: 2.19 — ABNORMAL HIGH (ref 0.00–1.49)
INR: 2.26 — ABNORMAL HIGH (ref 0.00–1.49)
Prothrombin Time: 15.9 seconds — ABNORMAL HIGH (ref 11.6–15.2)
Prothrombin Time: 19.6 seconds — ABNORMAL HIGH (ref 11.6–15.2)
Prothrombin Time: 22.5 seconds — ABNORMAL HIGH (ref 11.6–15.2)
Prothrombin Time: 24.2 seconds — ABNORMAL HIGH (ref 11.6–15.2)
Prothrombin Time: 24.8 seconds — ABNORMAL HIGH (ref 11.6–15.2)

## 2010-04-07 LAB — BASIC METABOLIC PANEL
BUN: 14 mg/dL (ref 6–23)
CO2: 28 mEq/L (ref 19–32)
Calcium: 9.5 mg/dL (ref 8.4–10.5)
Chloride: 105 mEq/L (ref 96–112)
Creatinine, Ser: 1.17 mg/dL (ref 0.4–1.2)
GFR calc Af Amer: 55 mL/min — ABNORMAL LOW (ref >60)
GFR calc non Af Amer: 46 mL/min — ABNORMAL LOW (ref >60)
Glucose, Bld: 106 mg/dL — ABNORMAL HIGH (ref 70–99)
Potassium: 3.4 mEq/L — ABNORMAL LOW (ref 3.5–5.1)
Sodium: 139 mEq/L (ref 135–145)

## 2010-04-07 LAB — CBC
HCT: 35.5 % — ABNORMAL LOW (ref 36.0–46.0)
HCT: 36.5 % (ref 36.0–46.0)
HCT: 36.8 % (ref 36.0–46.0)
HCT: 37.1 % (ref 36.0–46.0)
Hemoglobin: 11.9 g/dL — ABNORMAL LOW (ref 12.0–15.0)
Hemoglobin: 12.3 g/dL (ref 12.0–15.0)
Hemoglobin: 12.5 g/dL (ref 12.0–15.0)
Hemoglobin: 12.6 g/dL (ref 12.0–15.0)
MCHC: 33.6 g/dL (ref 30.0–36.0)
MCHC: 33.7 g/dL (ref 30.0–36.0)
MCHC: 33.9 g/dL (ref 30.0–36.0)
MCHC: 34 g/dL (ref 30.0–36.0)
MCV: 85.7 fL (ref 78.0–100.0)
MCV: 85.9 fL (ref 78.0–100.0)
MCV: 86.3 fL (ref 78.0–100.0)
MCV: 86.4 fL (ref 78.0–100.0)
Platelets: 186 10*3/uL (ref 150–400)
Platelets: 187 10*3/uL (ref 150–400)
Platelets: 195 10*3/uL (ref 150–400)
Platelets: 197 10*3/uL (ref 150–400)
RBC: 4.11 MIL/uL (ref 3.87–5.11)
RBC: 4.25 MIL/uL (ref 3.87–5.11)
RBC: 4.29 MIL/uL (ref 3.87–5.11)
RBC: 4.29 MIL/uL (ref 3.87–5.11)
RDW: 14.7 % (ref 11.5–15.5)
RDW: 14.9 % (ref 11.5–15.5)
RDW: 15 % (ref 11.5–15.5)
RDW: 15.1 % (ref 11.5–15.5)
WBC: 6 10*3/uL (ref 4.0–10.5)
WBC: 6.5 10*3/uL (ref 4.0–10.5)
WBC: 6.8 10*3/uL (ref 4.0–10.5)
WBC: 6.9 10*3/uL (ref 4.0–10.5)

## 2010-04-07 LAB — HEPARIN LEVEL (UNFRACTIONATED)
Heparin Unfractionated: 0.33 IU/mL (ref 0.30–0.70)
Heparin Unfractionated: 0.35 IU/mL (ref 0.30–0.70)
Heparin Unfractionated: 0.56 IU/mL (ref 0.30–0.70)

## 2010-04-08 LAB — DIFFERENTIAL
Basophils Absolute: 0.1 10*3/uL (ref 0.0–0.1)
Basophils Relative: 1 % (ref 0–1)
Eosinophils Absolute: 0.1 10*3/uL (ref 0.0–0.7)
Eosinophils Relative: 1 % (ref 0–5)
Lymphocytes Relative: 39 % (ref 12–46)
Lymphs Abs: 2.6 10*3/uL (ref 0.7–4.0)
Monocytes Absolute: 0.5 10*3/uL (ref 0.1–1.0)
Monocytes Relative: 7 % (ref 3–12)
Neutro Abs: 3.6 10*3/uL (ref 1.7–7.7)
Neutrophils Relative %: 53 % (ref 43–77)

## 2010-04-08 LAB — CBC
HCT: 39.6 % (ref 36.0–46.0)
Hemoglobin: 13.1 g/dL (ref 12.0–15.0)
MCHC: 33.1 g/dL (ref 30.0–36.0)
MCV: 86.3 fL (ref 78.0–100.0)
Platelets: 187 10*3/uL (ref 150–400)
RBC: 4.58 MIL/uL (ref 3.87–5.11)
RDW: 15.3 % (ref 11.5–15.5)
WBC: 6.8 10*3/uL (ref 4.0–10.5)

## 2010-04-08 LAB — PROTIME-INR
INR: 1.53 — ABNORMAL HIGH (ref 0.00–1.49)
INR: 1.7 — ABNORMAL HIGH (ref 0.00–1.49)
INR: 1.79 — ABNORMAL HIGH (ref 0.00–1.49)
INR: 1.81 — ABNORMAL HIGH (ref 0.00–1.49)
Prothrombin Time: 18.3 seconds — ABNORMAL HIGH (ref 11.6–15.2)
Prothrombin Time: 19.8 seconds — ABNORMAL HIGH (ref 11.6–15.2)
Prothrombin Time: 20.6 seconds — ABNORMAL HIGH (ref 11.6–15.2)
Prothrombin Time: 20.8 seconds — ABNORMAL HIGH (ref 11.6–15.2)

## 2010-04-08 LAB — BASIC METABOLIC PANEL
BUN: 15 mg/dL (ref 6–23)
CO2: 26 mEq/L (ref 19–32)
Calcium: 9.4 mg/dL (ref 8.4–10.5)
Chloride: 101 mEq/L (ref 96–112)
Creatinine, Ser: 1.12 mg/dL (ref 0.4–1.2)
GFR calc Af Amer: 58 mL/min — ABNORMAL LOW (ref >60)
GFR calc non Af Amer: 48 mL/min — ABNORMAL LOW (ref >60)
Glucose, Bld: 102 mg/dL — ABNORMAL HIGH (ref 70–99)
Potassium: 3.6 mEq/L (ref 3.5–5.1)
Sodium: 136 mEq/L (ref 135–145)

## 2010-04-08 LAB — HEMOGLOBIN AND HEMATOCRIT, BLOOD
HCT: 28.1 % — ABNORMAL LOW (ref 36.0–46.0)
HCT: 31.1 % — ABNORMAL LOW (ref 36.0–46.0)
HCT: 31.6 % — ABNORMAL LOW (ref 36.0–46.0)
HCT: 32.9 % — ABNORMAL LOW (ref 36.0–46.0)
HCT: 35.1 % — ABNORMAL LOW (ref 36.0–46.0)
HCT: 35.8 % — ABNORMAL LOW (ref 36.0–46.0)
Hemoglobin: 10.3 g/dL — ABNORMAL LOW (ref 12.0–15.0)
Hemoglobin: 10.7 g/dL — ABNORMAL LOW (ref 12.0–15.0)
Hemoglobin: 11.1 g/dL — ABNORMAL LOW (ref 12.0–15.0)
Hemoglobin: 11.8 g/dL — ABNORMAL LOW (ref 12.0–15.0)
Hemoglobin: 11.9 g/dL — ABNORMAL LOW (ref 12.0–15.0)
Hemoglobin: 9.3 g/dL — ABNORMAL LOW (ref 12.0–15.0)

## 2010-04-08 LAB — TYPE AND SCREEN
ABO/RH(D): O POS
Antibody Screen: NEGATIVE

## 2010-04-08 LAB — HEMOCCULT GUIAC POC 1CARD (OFFICE): Fecal Occult Bld: POSITIVE

## 2010-04-09 NOTE — Medication Information (Addendum)
Summary: rov/pc  Anticoagulant Therapy  Managed by: Windell Hummingbird, RN Referring MD: Sharrell Ku PCP: DR POLITE  ----- Doylene Bode MD: Shirlee Latch MD, Dalton Indication 1: Mitral Valve Replacement (ICD-V43.3) Indication 2: Mitral Valve Disorder (ICD-424.0) Lab Used: [facility] Site: [facility] INR POC 3.3 INR RANGE 2.5 - 3.5  Dietary changes: no    Health status changes: no    Bleeding/hemorrhagic complications: no    Recent/future hospitalizations: no    Any changes in medication regimen? no    Recent/future dental: no  Any missed doses?: no       Is patient compliant with meds? yes      Comments: Pt was scheduled for GI procedure that required Lovenox bridge.  Pt did not want to do Lovenox bridge.  Let [facility] know.  She called their office and canceled procedure.  Will continue Lovenox as usual.  Weston Brass PharmD  April 05, 2010 12:09 PM   Allergies: 1)  ! * Nexium  Anticoagulation Management History:      The patient is taking warfarin and comes in today for a routine follow up visit.  Positive risk factors for bleeding include an age of 74 years or older.  The bleeding index is 'intermediate risk'.  Positive CHADS2 values include History of HTN.  Negative CHADS2 values include Age > 44 years old.  The start date was 05/19/1997.  Her last INR was 4.8.  Anticoagulation responsible provider: Shirlee Latch MD, Dalton.  INR POC: 3.3.  Cuvette Lot#: [id number].  Exp: 03/2011.    Anticoagulation Management Assessment/Plan:      The patient's current anticoagulation dose is Coumadin 5 mg tabs: Take as directed by coumadin clinic..  The target INR is 2.5 - 3.5.  The next INR is due 05/03/2010.  Anticoagulation instructions were given to patient.  Results were reviewed/authorized by Windell Hummingbird, RN.  She was notified by Windell Hummingbird, RN.         Prior Anticoagulation Instructions: INR 3.8 Today only take 1/2 tablet.  Then resume taking 1 tablet every day, except take 1 1/2  tablets on Mondays, Wednesdays, and Fridays. Recheck in 2 weeks.   Current Anticoagulation Instructions: INR 3.3 Continue taking 1 tablet daily, except take 1 1/2 tablets on Mondays, Wednesdays, and Fridays. Recheck in 4 weeks.

## 2010-04-30 LAB — POCT URINALYSIS DIP (DEVICE)
Bilirubin Urine: NEGATIVE
Glucose, UA: NEGATIVE mg/dL
Ketones, ur: NEGATIVE mg/dL
Nitrite: POSITIVE — AB
Protein, ur: >=300 mg/dL — AB
Specific Gravity, Urine: 1.02 (ref 1.005–1.030)
Urobilinogen, UA: 0.2 mg/dL (ref 0.0–1.0)
pH: 7 (ref 5.0–8.0)

## 2010-05-03 ENCOUNTER — Ambulatory Visit (INDEPENDENT_AMBULATORY_CARE_PROVIDER_SITE_OTHER): Payer: Medicare Other | Admitting: *Deleted

## 2010-05-03 DIAGNOSIS — I4891 Unspecified atrial fibrillation: Secondary | ICD-10-CM

## 2010-05-03 DIAGNOSIS — Z7901 Long term (current) use of anticoagulants: Secondary | ICD-10-CM | POA: Insufficient documentation

## 2010-05-03 LAB — POCT INR: INR: 2.8

## 2010-05-17 ENCOUNTER — Other Ambulatory Visit: Payer: Self-pay | Admitting: Internal Medicine

## 2010-05-31 ENCOUNTER — Ambulatory Visit (INDEPENDENT_AMBULATORY_CARE_PROVIDER_SITE_OTHER): Payer: PRIVATE HEALTH INSURANCE | Admitting: *Deleted

## 2010-05-31 DIAGNOSIS — I4891 Unspecified atrial fibrillation: Secondary | ICD-10-CM

## 2010-05-31 LAB — POCT INR: INR: 2.8

## 2010-06-04 NOTE — Assessment & Plan Note (Signed)
[facility]                         ELECTROPHYSIOLOGY OFFICE NOTE   [name], [name]                       MRN:          [id number]  DATE:05/29/2006                            DOB:          02-24-1935    [name] returns today for followup. She is a very pleasant, middle-  aged woman with a history of nonischemic cardiomyopathy, atrial  fibrillation, rapid ventricular response and mitral valve replacement on  Coumadin who returns today for followup. The patient in the past has had  problems with ventricular tachycardia in the setting of LV dysfunction;  however, she has been nicely rate controlled in the last few years and  her LV function is actually improved. Her heart failure is essentially  resolved and she has class 1-2 symptoms. She denies palpitations. She  has had no peripheral edema. She was recently hospitalized with colonic  polyps and had to have a partial colectomy for a tubulous adenoma. She  has had no syncope.   PHYSICAL EXAMINATION:  GENERAL:  She is a pleasant, well-appearing,  middle-aged woman in no distress.  VITAL SIGNS:  Her blood pressure was 160/100, the pulse 60 and  irregularly irregular. The respirations were 18, weight was 156 pounds.  NECK:  Revealed no jugular venous distention.  LUNGS:  Clear bilaterally to auscultation. No wheezes, rales or rhonchi.  CARDIOVASCULAR:  Revealed an irregular irregular rhythm with mechanical  mitral valve closure sound. There is a soft systolic murmur at the left  lower sternal border.  EXTREMITIES:  Demonstrated no cyanosis, clubbing or edema.   EKG demonstrates atrial fibrillation with controlled ventricular  response and right bundle branch block.   IMPRESSION:  1. Chronic atrial fibrillation.  2. Status post mitral valve replacement secondary to mitral      regurgitation.  3. History of dilated cardiomyopathy now with return to normal left      ventricular function.   DISCUSSION:  Overall [name] is stable. Her atrial fibrillation is  well controlled. Her heart failure is resolved and her LV function has  improved. Will plan to see her back in the office in 6 months.   ADDENDUM:  The patient underwent a 2-D echo under the order of Dr. Olga Millers back at the end of March. Her LV function ws 50-55% and she had  posterolateral hypokinesis. Her main transmitral gradient was 3 mmHg  with a moderately dilated left atrium.     Doylene Canning. [name], MD  Electronically Signed    GWT/MedQ  DD: 05/29/2006  DT: 05/29/2006  Job #: [id number]

## 2010-06-04 NOTE — Op Note (Signed)
[name], [name]                ACCOUNT NO.:  [id number]   MEDICAL RECORD NO.:  [id number]          PATIENT TYPE:  AMB   LOCATION:  ENDO                         FACILITY:  [facility]   PHYSICIAN:  Shirley Friar, MDDATE OF BIRTH:  13-Mar-1935   DATE OF PROCEDURE:  01/26/2007  DATE OF DISCHARGE:                               OPERATIVE REPORT   INDICATIONS:  History of tubulovillous adenoma, status post right  hemicolectomy.   MEDICATIONS:  Fentanyl 100 mcg IV, Versed 10 mg IV, Ampicillin 2gm IV,  Gentamicin 40mg  IV.   FINDINGS:  Pediatric Pentax colonoscope was inserted through a fair  prepped colon and advanced to the ileocolonic anastomosis.  The  ileocolonic anastomosis appeared normal.  On careful withdrawal of the  colonoscope, no mucosal abnormalities were seen.  Retroflexion revealed  small internal hemorrhoids.   ASSESSMENT:  1. Normal-appearing ileocolonic anastomosis.  2. Small internal hemorrhoids.   PLAN:  Repeat colonoscopy in 2 years.      Shirley Friar, MD  Electronically Signed     VCS/MEDQ  D:  01/26/2007  T:  01/26/2007  Job:  [id number]   cc:   Stacie Acres. Cliffton Asters, M.D.  Anselm Pancoast. Zachery Dakins, M.D.

## 2010-06-04 NOTE — Assessment & Plan Note (Signed)
[facility]                            CARDIOLOGY OFFICE NOTE   BUNA, CUPPETT                       MRN:          [id number]  DATE:03/01/2008                            DOB:          13-Oct-1935    PRIMARY CARDIOLOGIST:  Doylene Canning. [name], MD   PRIMARY CARE PHYSICIAN:  Deirdre Peer. Nehemiah Settle, MD   GASTROENTEROLOGIST:  Shirley Friar, MD   [name] is a very pleasant 75 year old African American female  patient who I saw on February 16, 2008, with recurrent episodes of chest  pain after eating dinner and feelings of things getting stuck.  She has  also had associated belching ingestion and nausea.  I had asked her to  see Dr. Bosie Clos who felt that the Fosamax that she takes may be causing  a lot of her symptoms and asked her to take it before she eats which has  helped.  Because of her history of mitral valve replacement in 1987, I  ordered an adenosine Myoview as well as an echo.  The adenosine Myoview  showed normal LV function, EF 62%, normal stress test.  That echo was  just performed today and preliminary shows normal LV function, but has  not been read.  She also had complaints of frequent urination and UA C&S  came back with Klebsiella pneumonia.  She had been treated with Cipro  that Dr. [name] had called in.  She said since her UTI has been treated,  this has helped her immensely.  She has not had any further chest pain,  palpitations, or other cardiac complaints.   CURRENT MEDICATIONS:  1. Coumadin as directed.  2. Caltrate once daily.  3. Protonix 40 mg daily.  4. Cardizem 180 mg daily.  5. Lopressor 50 mg 1-1/2 b.i.d.  6. Digoxin 0.125 mg daily.  7. Hydrochlorothiazide 25 mg one-half daily.  8. Fosamax 70 mg weekly.  9. Cipro.  She is finishing her prescription.   PHYSICAL EXAMINATION:  GENERAL:  This is a very pleasant 75 year old  African American female in no acute distress.  VITAL SIGNS:  Blood pressure 126/76, pulse 69,  weight 179.  NECK:  Without JVD, HJR, bruit, or thyroid enlargement.  LUNGS:  Clear anterior, posterior, and lateral.  HEART:  Irregular irregular at 68 beats per minute.  Normal S1 and S2.  Crisp valvular clicks.  No significant murmur, rub, bruit, thrill, or  heave noted.  ABDOMEN:  Soft without organomegaly, masses, lesions, or abnormal  tenderness.  EXTREMITIES:  Without cyanosis, clubbing, or edema.  She has good distal  pulses.   IMPRESSION:  1. Chest pain resolved with adjustment of timing of taking her Fosamax      and treatment of her urinary tract infection.  2. Negative adenosine Myoview on February 29, 2008.  3. Status post [employer] Jude mitral valve replacement in 1987.  4. Chronic atrial fibrillation.  5. History of nonsustained ventricular tachycardia.  6. Chronic Coumadin therapy.  7. Right bundle-branch block.  8. Hypertension.  9. History of remote dilated cardiomyopathy, resolved.  10.History of gastrointestinal bleed secondary to  tubulovillous      adenoma with no malignancy in January 2008.  11.History of asthma.   PLAN:  The patient is doing much better.  Her labs have been reviewed.  Creatinine is 1.3, but she is on Cipro.  Hemoglobin 11.8, hematocrit  34.9.  She has an appointment to see Dr. [name] back in March.  She is  to call if she has any further problems.      Jacolyn Reedy, PA-C  Electronically Signed      Madolyn Frieze. Jens Som, MD, [facility]  Electronically Signed   ML/MedQ  DD: 03/01/2008  DT: 03/02/2008  Job #: [id number]

## 2010-06-04 NOTE — Assessment & Plan Note (Signed)
[facility]                         ELECTROPHYSIOLOGY OFFICE NOTE   [name], [name]                       MRN:          [id number]  DATE:06/17/2007                            DOB:          February 13, 1935    [name] returns today for follow-up.  She is very pleasant almost 75-  year-old woman with a history of mitral valve replacement, chronic  atrial fibrillation, nonsustained VT, right bundle branch block, recent  colon cancer status post surgery and hypertension who returns today for  follow-up.  Despite all the above medical problems, she has been stable.  When I saw her most recently approximately six months ago, in addition  to all of the above, she was complaining of severe leg pain.  We did a  Doppler which demonstrated no DVT.  The patient has been stable since  then.  She denies recurrent leg pain.  She has otherwise had no specific  complaints.   Her medications include:  1. Coumadin as directed.  2. Caltrate.  3. Protonix 40 a day.  4. Cardizem 180 a day.  5. Lopressor 50 mg 1/2 tablet twice daily.  6. Digoxin 0.125 daily.  7. Hydrochlorothiazide.   On exam, she is a pleasant, well-appearing, almost 75 year old woman in  no distress.  Blood pressure was 122/84, the pulse 53 and irregular,  respirations were 18, the weight was 169 pounds.  The neck revealed no jugular venous distention.  LUNGS:  Clear bilaterally to auscultation.  No wheezes, rales or rhonchi  were present.  CARDIOVASCULAR EXAM:  Revealed a irregular bradycardia with normal S1  and a split S2.  There was a mechanical mitral valve closure sound  present.  EXTREMITIES:  Demonstrated no edema.  ABDOMINAL EXAM:  Soft, nontender.   EKG demonstrates atrial fibrillation with a slow ventricular response  and right bundle branch block.   IMPRESSION:  1. Chronic atrial fibrillation.  2. Status post mitral valve replacement.  3. Remote history of dilated  cardiomyopathy, now resolved.  4. Recent colon cancer status post resection.  5. Chronic Coumadin therapy.   DISCUSSION:  Overall, [name] is stable.  She has had no symptomatic  arrhythmias.  Her Coumadin levels have been stable as well.  I have  recommended that she continue to remain  active and exercise as much as possible.  I will see her back in the  office in one year, sooner should she have worsening cardiac symptoms.     Doylene Canning. [name], MD  Electronically Signed    GWT/MedQ  DD: 06/17/2007  DT: 06/17/2007  Job #: [id number]   cc:   Deirdre Peer. Polite, M.D.

## 2010-06-04 NOTE — Assessment & Plan Note (Signed)
[facility]                            CARDIOLOGY OFFICE NOTE   [name], [name]                       MRN:          [id number]  DATE:02/16/2008                            DOB:          [birth year]    PRIMARY CARDIOLOGIST:  Tami Canning. Tami Ridgel, MD   PRIMARY CARE PHYSICIAN:  Tami Peer. Nehemiah Settle, MD   GASTROENTEROLOGIST:  Tami Friar, MD   [name] is a 75 year old African American female who has a history of  [name] mitral valve replacement in 1987, chronic atrial fibrillation,  nonsustained V-tach, right bundle-branch block, and hypertension.  The  patient last saw Dr. Ladona [name] in May 2009, at which time she was doing  well.   She comes in today after an episode of chest pain which began Monday  evening after she ate dinner of chicken and potatoes.  She felt like  something got stuck in her esophagus and developed chest pressure,  burning into her back.  This lasted all night and into Tuesday.  She  said she had a lot of belching, indigestion, and nausea.  She tried to  drink ginger ale and water to help relieve it.  She denied any  palpitations, dizziness, diaphoresis, or syncope.  She had hiccups as  well and said she laid down most of the day yesterday until last evening  when she finally felt something passed and she got relief.  She is  feeling better this morning and is able to eat again.  She said she had  quite a bit of trouble even swallowing pills.  She has never had  coronary artery disease.  She did have an adenosine Myoview back in 2006  which was normal.  EF was 58%.  Her last echo was in March 2008.  Cardiac risk factors are significant for hypertension.  She has no  history of diabetes, hyperlipidemia, no family history of coronary  artery disease, and she quit smoking in 1987.  She has 9 pack-year  history of cigarettes.   She also complains of urinary frequency.  No burning or incontinence.   CURRENT MEDICATIONS:  1.  Coumadin as directed.  2. Caltrate once daily.  3. Protonix 40 mg daily.  4. Cardizem 180 mg daily.  5. Lopressor 50 mg one-half b.i.d.  6. digoxin 0.125 mg daily.  7. Hydrochlorothiazide 25 mg one-half daily.  8. Fosamax 70 mg weekly.   PHYSICAL EXAMINATION:  GENERAL:  This is a pleasant 75 year old African  American female in no acute distress.  VITAL SIGNS:  Blood pressure 140/90, pulse 68, weight 140 which is down  almost 30 pounds since she was seen last May.  Neck:  Without JVD, HJR, bruit, or thyroid enlargement.  LUNGS:  Clear anterior, posterior, and lateral.  HEART:  Irregular rate and rhythm at 68 beats per minute.  Normal S1 and  S2.  Crisp valvular clicks.  No significant murmur, rub, bruit, thrill,  or heave noted.  ABDOMEN:  Soft without organomegaly, masses, lesions, or abnormal  tenderness.  EXTREMITIES:  Without cyanosis, clubbing, or edema.  She has good  distal  pulses.   EKG:  Atrial fibrillation with right bundle-branch block.  No acute  change.  Controlled ventricular rate.   IMPRESSION:  1. Chest pain into back associated with dysphasia, belching, hiccups,      and indigestion, suspect gastrointestinal etiology.  2. Status post [name] mitral valve replacement in 1987.  3. Chronic atrial fibrillation.  4. History of nonsustained ventricular tachycardia.  5. Chronic Coumadin therapy.  6. Right bundle-branch block.  7. Hypertension.  8. History of a remote dilated cardiomyopathy, resolved.  9. History of gastrointestinal bleed secondary to tubulovillous      adenoma with no malignancy in January 2008.  10.History of asthma.   PLAN:  I do believe the patient's symptoms are GI in origin.  Because of  her mitral valve replacement in 1987. we will follow up on a 2-D echo  and adenosine Myoview.  I have asked her to contact Dr. Bosie [name] today  and make an appointment as soon as possible for a GI evaluation.  We  will check a CBC, BMET, and UA C & S today.   I will see her back in 2  weeks, and she will see Dr. Ladona [name] back in 2-3 months if all the above  tests were negative.  I have asked her to call us if she has any  recurrent chest pain in the meantime.      Tami Reedy, PA-C  Electronically Signed      Tami Abed, MD, [name]  Electronically Signed   ML/MedQ  DD: 02/16/2008  DT: 02/16/2008  Job #: [id number]   cc:   [name], M.D.  Tami Friar, MD

## 2010-06-04 NOTE — Assessment & Plan Note (Signed)
[place] [facility]                         ELECTROPHYSIOLOGY OFFICE NOTE   CYLEE, DATTILO                       MRN:          [id number]  DATE:11/25/2006                            DOB:          1935/03/02    Ms. Trosper returns today for followup.  She is a very pleasant 75-year-  old woman with a history of chronic atrial fibrillation, right bundle  branch block and nonischemic rate-related cardiomyopathy now resolved,  history of mitral valve replacement, and a history of epistaxis.  The  patient returns today for followup.  She complains of pressure which she  experienced in her chest several hours after eating ice cream and  drinking milk.  She had extra belching and flatulence associated with  her chest pressure.  She also notes some diarrhea.  The patient also  complains of pain in her left leg which has been present for  approximately 1 week.  There is an area that is tender and slightly  swollen.  She denies worsening shortness of breath.   PHYSICAL EXAMINATION:  GENERAL:  She is a pleasant 75 year old woman in  no acute distress.  VITAL SIGNS:  The blood pressure today was 140/90.  The pulse was 60 and  irregularly irregular.  Respirations were 18.  The weight was 156  pounds.  NECK:  Revealed no jugular venous distention.  LUNGS:  Clear bilaterally to auscultation.  No wheezes, rales, or  rhonchi were present.  CARDIOVASCULAR:  Revealed an irregular rate and rhythm with normal S1  and a split S2.  There was a mechanical mitral valve closure sound  present.  EXTREMITIES:  Demonstrated her left leg to have an area of ecchymoses  approximately 3-cm in diameter which was tender to palpate.  This was on  the anterior medial aspect of the tibia.  It was not back on the calf  muscle.   MEDICATIONS:  1. Coumadin as directed.  2. Protonix 40 a day.  3. Cardizem 180 a day.  4. Lopressor 50 mg one and a half tablets twice daily.  5. Digoxin  0.125 mg daily.  6. HCTZ 25 daily.   Her EKG demonstrates atrial fibrillation with a right bundle branch  block.   IMPRESSION:  1. Chronic atrial fibrillation, now with well controlled ventricular      rates.  2. History of nonischemic cardiomyopathy, now with preserved left      ventricular function.  3. Right bundle branch block.  4. Status post mitral valve replacement.  5. Chronic Coumadin therapy.  6. Chest pain which sounds like lactose intolerance.  7. Leg pain of unclear etiology.   DISCUSSION:  We will plan on obtaining a venous Doppler of her left leg  today to make sure she does not have a DVT.  I have warned her that if  the sore on her leg does not improve, then she will need to have it  evaluated perhaps with an MRI scan or CT scan and I have asked that she  follow up with Dr. Renford Dills who is her primary physician for  additional evaluation of  this.     Doylene Canning. [name], MD  Electronically Signed    GWT/MedQ  DD: 11/25/2006  DT: 11/25/2006  Job #: [id number]   cc:   Deirdre Peer. Polite, M.D.

## 2010-06-07 NOTE — Consult Note (Signed)
[name], [name] NO.:  [id number]   MEDICAL RECORD NO.:  [id number]          PATIENT TYPE:  INP   LOCATION:  6734                         FACILITY:  [facility]   PHYSICIAN:  Cherylynn Ridges, M.D.    DATE OF BIRTH:  [birth year]   DATE OF CONSULTATION:  01/29/2006  DATE OF DISCHARGE:                                 CONSULTATION   REQUESTING PHYSICIAN:  Dr. Bosie Clos with GI.   PRIMARY CARE PHYSICIAN:  [facility].   CARDIOLOGIST:  Dr. Lewayne Bunting and Dr. Olga Millers.   REASON FOR CONSULTATION:  Fecal tubulovillous adenoma.   HISTORY OF PRESENT ILLNESS:  [name] is a 75 year old female patient  with known benign polyps, tubular adenoma diagnosed on colonoscopy in  January 2006.  She presented back to Dr. Bosie Clos with symptoms of  bright red blood per rectum and constipation.  She was also reporting a  10 pound weight loss over the past 6 months.  Because the patient is on  Coumadin, she was admitted by Dr. Bosie Clos for heparin bridging and  underwent colonoscopy on January 27, 2006.  Pathology has subsequently  returned positive for tubulovillous adenoma.  Surgical evaluation has  been requested.   REVIEW OF SYSTEMS:  As above.   PAST MEDICAL HISTORY:  1. Hypertension.  2. Atrial fibrillation.  3. Nonischemic cardiomyopathy with history of CHF, ejection fraction      56% based on Cardiolite study 2005.  4. History of syncope.  5. Asthma.  6. GERD.  7. Osteoporosis.  8. Microcytic anemia.   PAST SURGICAL HISTORY:  1. Mitral valve prosthesis St. Jude with angioplasty in 1987.  This      was done secondary to secondary to rheumatic heart disease.  2. Cholecystectomy.  3. Tubal ligation.   SOCIAL HISTORY:  No alcohol.  No tobacco.   FAMILY HISTORY:  Noncontributory.   ALLERGIES:  NKDA.   CURRENT MEDICATIONS:  Please refer to the medication reconciliation  sheet for patient's home medications.  Current medications are  amiodarone,  Lopressor, Norvasc, Protonix, Enablex, heparin IV with  pharmacy titrating dosage.   PHYSICAL EXAM:  GENERAL:  Pleasant female patient complained of recent  issues related to light lower GI bleeding, painless with subsequent  colonoscopy pathology revealing a cancerous process.  VITAL SIGNS:  Temperature 97.9, BP 147/85, pulse 65 and regular,  respirations 18.  NEURO:  The patient is alert and oriented x3, moving  all extremities x4.  HEENT:  Head normocephalic.  Sclera noninjected.  NECK:  Supple.  No adenopathy.  CHEST:  Bilateral lung sounds clear to auscultation.  Respiratory effort  is nonlabored.  She is on room air sating 97%.  CARDIAC:  S1-S2 heart sounds are crisp with mechanical heart valve  sounds.  No JVD.  Pulses regular.  She is on IV heparin.  ABDOMEN:  Soft, nontender, nondistended without hepatosplenomegaly,  masses or bruits.  EXTREMITIES:  Symmetrical in appearance without edema, cyanosis or  clubbing.   LAB:  White count 5300, hemoglobin 10.7, platelets 172,000.  PT 15.6,  INR 1.2.  Sodium  137, potassium 3.9, CO2 24, glucose 83, BUN 9,  creatinine 1.12, estimated GFR 59.   DIAGNOSTICS:  Colonoscopy done which revealed a descending colon polyp  that was snared with a cautery.  She also had a polypoid lesion in the  cecum adjacent to the ileocecal valve post multiple biopsies with  subsequent pathology positive for tubulovillous adenoma.   IMPRESSION:  1. Lower gastrointestinal bleeding which is mild secondary to      tubulovillous adenoma.  2. Atrial fibrillation.  3. History of prosthetic heart valve on systemic Coumadin      anticoagulation now on heparin.  4. Hypertension, controlled.  5. Nonischemic cardiomyopathy, ejection fraction 56%.  6. History of congestive heart failure.  7. Asthma.  8. Anemia.   PLAN:  1. Unfortunately, the patient had been on solid food for at least 2      days, though unable to proceed with surgical resection, i.e.       hemicolectomy on Friday tomorrow.  So therefore we will plan for      operative intervention on Monday.  I think Dr. Zachery Dakins takes over      the CCS service on that day.  2. We will begin clear liquid diet on Sunday with bowel prep.  3. Due to the patient's cardiac and pulmonary history, we will check a      chest x-ray and EKG preoperative.  4. Currently, her hemoglobin is stable.  She is showing no signs of      active bleeding.  5. Any additional recommendations per Dr. Lindie Spruce.  He has also      interviewed and examined the patient.      [name], N.P.      Cherylynn Ridges, M.D.  Electronically Signed    ALE/MEDQ  D:  01/29/2006  T:  01/30/2006  Job:  [id number]   cc:   Shirley Friar, MD  [facility]  Madolyn Frieze. Jens Som, MD, [facility]  Doylene Canning. [name], MD

## 2010-06-07 NOTE — Discharge Summary (Signed)
[place]  Patient:    CANNA, NICKELSON                       MRN: [id number] Adm. Date:  [id number] Disc. Date: 05/17/99 Attending:  Daisey Must Dictator:   Lavella Hammock, P.A. CC:         Rudene Christians. [name], M.D. [facility]             Cindra Eves, D.D.S.             Carolan Shiver, M.D., [facility]             Lavonia Dana, M.D., [facility]                           Discharge Summary  DATE OF BIRTH:  16-Jul-1935  PROCEDURES:  Nasal packing.  HOSPITAL COURSE:  Mrs. Mothershead is a 75 year old female with a history of mitral valve replacement in 1987 on chronic anticoagulation who presented to the emergency room with left-sided posterior epistaxis.  She was seen by ENT and had packing done.  This controlled the hemorrhage.  Her Coumadin was held, and she was followed.  Dr. Dorma Russell recommended that she be admitted and observed for five days, and this was done.  She had a history of chronic atrial fibrillation.  She was on amiodarone and Lopressor, and those medications were continued.  Her hemoglobin went from 12.2 to 9.5 but stabilized, and she was not transfused.  She was given 3 units of fresh frozen plasma in order to reverse her anticoagulation.  She was given vitamin K to supplement the reversal of her anticoagulation.  By May 16, 1999, her packing had been discontinued, and she was doing well.  She had her Coumadin resumed.  She had dental x-rays ordered, and dental consult was called.  She had a Panorex done, and she was scheduled to be seen in the dental clinic in followup.  By May 17, 1999, she was stable and considered ready for discharge at that time.  Her INR on discharge was 1.3.  She was scheduled to be seen at the Coumadin clinic on April 30 at 10:15 a.m., and she has a followup scheduled with Dr. [name] on Wednesday, May 30.  DISCHARGE CONDITION:  Stable.  CONSULTS:  Dr. Dorma Russell with ENT  and dental consult.  COMPLICATIONS:  None.  DISCHARGE DIAGNOSES:  1. Acute epistaxis.  2. Chronic anticoagulation secondary to artificial valve.  3. Chronic atrial fibrillation.  4. Status post aortic valve replacement in 1982 secondary to rheumatic     heart disease with a St. Jude.  5. Nonobstructive coronary artery disease by catheterization September 1999.  6. Left ventricular dysfunction with an ejection fraction of 30 to 35% by     catheterization in 1999.  7. History of congestive heart failure.  8. Hypertension.  9. History of asthma. 10. History of anemia. 11. History of syncope with negative electrophysiological study. 12. History of renal insufficiency.  DISCHARGE INSTRUCTIONS:  1. Activity level is to be as tolerated.  2. She is to stick to a low-salt, low-fat diet.  3. She is to call the office if the nose bleed recurs.  4. She is to get a pro time check on Monday at 10:15.  5. She is to follow up with Dr. [name] on Wednesday, May 30, at  10:15.  6. She is to follow up with Dr. Mayford Knife as needed.  7. She is to keep her dental appointment as scheduled.  DISCHARGE MEDICATIONS:  1. Coumadin 2.5 mg q.d.  2. Procardia XL 30 mg q.d.  3. Multivitamin with iron q.d.  4. Augmentin 875 mg b.i.d. for 2 more days.  5. Saline nasal spray as directed.  6. Resume all other previous home medications. DD:  05/17/99 TD:  05/17/99 Job: [id number]

## 2010-06-07 NOTE — Consult Note (Signed)
[name], [name]                ACCOUNT NO.:  [id number]   MEDICAL RECORD NO.:  [id number]          PATIENT TYPE:  INP   LOCATION:  0104                         [facility]ACILITY:  [facility]   PHYSICIAN:  Shirley Friar, MDDATE OF BIRTH:  12/16/1935   DATE OF CONSULTATION:  06/01/2005  DATE OF DISCHARGE:                                   CONSULTATION   REASON:  Pancreatic mass.   HISTORY OF PRESENT ILLNESS:  This consult was completed at the request of  Dr. Nehemiah Settle and colleagues due to the patient's abnormal CT scan showing  pancreatic mass.  The patient reports two-week history of weight loss of 10  pounds along with intermittent crampy abdominal pain.  She began having  nausea and vomiting this morning and came in for further evaluation.  She  also reports watery stools for the last four days but denies any clay-  colored stools and denies any dark urine or yellow eyes.  She had a CT scan  in the ER which showed pancreatic head mass with metastatic adenopathy and  liver metastases.  There is no evidence of any obstructive jaundice on labs  and, in fact, she has nearly normal liver function tests with a total  bilirubin of 1.3, ALP 62, AST 52, ALT 45 and lipase 17.  Her albumin is 3.1.  Of note, she is on chronic Coumadin secondary to a prosthetic heart valve  and atrial fibrillation.  Her temperature on arrival is 100.4 with pulse of  106 and blood pressure 137/76.   PAST MEDICAL HISTORY:  1.  History of atrial fibrillation.  2.  Status post mitral valve annuloplasty in 1987.  3.  Status post cholecystectomy.  4.  Status post tubal ligation.  5.  Hypertension.  6.  Obesity.  7.  History of colon polyps.  8.  Status post prosthetic mitral valve in 1987.   MEDICINES:  Amiodarone, amlodipine, metoprolol, Protonix, warfarin.   ALLERGIES:  NO KNOWN DRUG ALLERGIES.   FAMILY HISTORY:  Noncontributory.   SOCIAL HISTORY:  She denies alcohol or cigarettes.   REVIEW OF SYSTEMS:   Negative except as above.   PHYSICAL EXAM:  Temperature 100.4, pulse 106, blood pressure 137/76, O2  saturation 100% on room air.  GENERAL:  Alert, no acute distress.  HEENT:  Nonicteric sclerae.  CHEST:  Clear to auscultation anteriorly.  CARDIOVASCULAR:  Regular rate and rhythm without murmurs.  ABDOMEN:  Tenderness in left mid quadrant.  Otherwise, nontender.  No  guarding.  No rebound.  Positive bowel sounds.  EXTREMITIES:  NO edema.   LABS:  White blood count 9.6, hemoglobin 11.1, hematocrit 34.2, platelet  count 219.  INR 2.3.  Sodium 133, potassium 3.4, chloride 103, CO2 23,  glucose 122, BUN 12, creatinine 1.6, total bilirubin 1.3, ALP 62, AST 55,  ALT 45, albumin 3.1, lipase 17.   IMPRESSION:  A 75 year old black female with abdominal CT scan showing  pancreatic head mass with liver lesion concerning for metastasis and  metastatic adenopathy.  The patient is not having any signs or symptoms of  obstructive jaundice but  is at risk of obstruction due to this pancreatic  head mass.  The patient will likely need ERCP with stent placement at some  point although most likely will delay it at this time since the patient has  no immediate need and has increased risk of bleeding due to Coumadin issue.  Agree with bridging with heparin at this time and will plan on doing an MRCP  to better characterize her lesion prior to possible ERCP or stent placement.  I discussed the risks, benefits and alternatives with the patient regarding  ERCP.  She has not given consent for that procedure at this time.  I told  [name] that MRCP would most likely occur first and then will readdress  the ERCP possibility depending on the MRCP findings.  I see no immediate  need to place a stent at this time and this may be done either as an  outpatient or inpatient later this week pending further imaging studies.      Shirley Friar, MD  Electronically Signed     VCS/MEDQ  D:  06/01/2005  T:   06/02/2005  Job:  [id number]

## 2010-06-07 NOTE — Discharge Summary (Signed)
[name], [name]                ACCOUNT NO.:  [id number]   MEDICAL RECORD NO.:  [id number]          PATIENT TYPE:  INP   LOCATION:  1432                         FACILITY:  [facility]   PHYSICIAN:  [name], MD  DATE OF BIRTH:  December 21, 1935   DATE OF ADMISSION:  06/01/2005  DATE OF DISCHARGE:  06/04/2005                                 DISCHARGE SUMMARY   CHIEF COMPLAINT AT THE TIME OF ADMISSION:  Nausea and vomiting, abdominal  pain and diarrhea.   DISCHARGING DIAGNOSES:  1.  Urinary tract infection.  The patient was started on Cipro I.V. and has      noted improved appetite.  Her urine culture is growing Escherichia coli      greater than 100,00 colonies that is sensitive to ciprofloxacin.  We      will continue her antibiotics for a full 7 days and have her follow up      with her primary care physician in 2 weeks.  2.  Suspicious pancreatic mass with liver nodules.  The patient underwent a      CAT scan on admission to evaluate her for a 10-pound weight loss in the      context of the nausea, vomiting and diarrhea and was found to have a      suspicious lesion in the pancreatic head with possible metastatic      adenopathy and liver metastases.  The patient underwent MRI which did      not confirm any mass lesion and nodular densities in the liver were      noted on a previous study and not thought to be metastatic.  The lesions      have not changed since the last study but will need to be followed.      There was no plan at this time for biopsy of these lesions.  The patient      will therefore follow up as an outpatient with Dr. Bosie Clos.  3.  Atrial fibrillation.  The patient has remained in rate-controlled atrial      fibrillation on amiodarone, amlodipine, and metoprolol.  She also is      adequately anticoagulated at the time of discharge, although her      Coumadin was held pending possible liver biopsy.  Her discharging INR is      2.1.  Will ask her to follow up on  Friday with a PT/INR to assure that      she has not decreased her INR past 2.  4.  History of mitral valve repair.  Again, the patient remains      anticoagulated appropriately for this condition.  She should follow up      with her cardiologist pending any new complaints.  5.  Hypertension.  The patient has been maintained on amlodipine,      metoprolol, and no change have been made to this regimen.  6.  Gastroesophageal reflux disease.  She will be maintained on Protonix 40      mg once daily.  7.  Osteoporosis.  She will remain on  Caltrate 600 twice daily.  8.  Anemia.  Please have the patient follow up as an outpatient with her      primary care physician.   MEDICATIONS AT THE TIME OF DISCHARGE:  1.  Amiodarone 200 mg every day.  2.  Coumadin 2.5 mg on Tuesday and 5 mg every other day of the week -      Monday, Wednesday, Thursday, Friday, Saturday, Sunday.  3.  Metoprolol 100 mg twice daily.  4.  Vesicare 10 mg every day.  5.  Protonix 40 mg one daily.  6.  Amlodipine 5 mg daily.  7.  Caltrate 600 mg twice daily.  8.  Cipro 500 mg twice daily.   HOSPITAL COURSE:  The patient is a very pleasant 75 year old female who  presented to the emergency room with nausea, vomiting, weight loss, and was  found to have suspicious pancreatic lesion as well as liver lesions.  The  patient was admitted to the general medical floor.  She was treated  symptomatically for nausea and vomiting and she was discovered to have a  urinary tract infection which she was started on Cipro.  The organism in her  urine is E. coli and it is sensitive to the Cipro.  The patient was  consulted on by GI who recommended doing an MRI of her abdomen/MRCP.  She  underwent this study and discovered that there was no mass in her pancreas  and that the liver lesions appear to be old and have not changed.  She will  subsequently follow up with Dr. Bosie Clos as an outpatient for those lesions  and will continue on her  antibiotic therapy.  Today the patient states that  her abdomen is much less tender.  She denies any nausea, vomiting or  diarrhea.  She actually had an appetite today and did eat very well.  Her  vital signs are stable with a temperature of 97, blood pressure 137/76,  pulse is 76, respirations 20, saturation 99%.  Generally, this is a well-  developed, well-nourished African-American female in no acute distress.  She  is normocephalic, atraumatic.  Pupils equal and round, reactive to light.  Extraocular muscles are intact.  Mucous membranes are moist.  Neck is  supple.  There is no JVD, no lymph nodes, and no carotid bruits.  Her chest  is clear to auscultation.  There is no rhonchi, rales, or wheezes.  Cardiovascular is irregular rate and rhythm, positive S1, S2.  No S3, S4.  She does have click.  Abdomen is soft, nontender, nondistended with positive  bowel sounds.  Extremities showed no clubbing, cyanosis or edema.   Pertinent laboratory values during the course of the hospital stay reveals a  sodium of 139, potassium 3, chloride 106, CO2 is 28, glucose is 97, BUN is  7, creatinine is 1.2.  CBC revealed hemoglobin of 10.1 and 31.2.  Her TSH is  2.875.  Her alpha-fetoprotein is within normal limits at 3.3.  Her CEA is  1.1, her CA 119 is 6.8 which is low.  Her lipase was 17, which is low.   At this time the patient is deemed stable to follow up as an outpatient for  her urinary tract infection and for the lesions in her liver with Dr.  Bosie Clos.   DISPOSITION:  To home.   CONDITION:  Stable.      [name], MD  Electronically Signed     MLT/MEDQ  D:  06/04/2005  T:  06/05/2005  Job:  [id number]   cc:   Shirley Friar, MD  Fax: [phone]   Stacie Acres. Cliffton Asters, M.D.  Fax: [phone]

## 2010-06-07 NOTE — Discharge Summary (Signed)
[name], [name]                ACCOUNT NO.:  [id number]   MEDICAL RECORD NO.:  [id number]          PATIENT TYPE:  INP   LOCATION:  4704                         FACILITY:  [facility]   PHYSICIAN:  Santiago Bumpers. Hensel, M.D.DATE OF BIRTH:  1935-02-06   DATE OF ADMISSION:  07/15/2004  DATE OF DISCHARGE:  07/18/2004                                 DISCHARGE SUMMARY   DISCHARGE DIAGNOSES:  1.  Atrial fibrillation with rapid ventricular response.  2.  Hypertension.  3.  Anemia.  4.  Hypokalemia.   DISCHARGE MEDICATIONS:  1.  Amiodarone 200 mg p.o. daily.  2.  Protonix 40 mg p.o. daily.  3.  Cozaar 25 mg p.o. daily.  4.  Metoprolol 100 mg p.o. b.i.d.  5.  Coumadin 2.5 mg Tuesday, Thursday and Saturday and 5 mg all other days.  6.  Ferrous sulfate 325 mg p.o. b.i.d.   FOLLOWUP ITEMS:  Follow up with Dr. Leitha Schuller of [facility]  on July 26, 2004, and follow up with Dr. [name] of [facility] for a cardiac  stress test on July 22, 2004, at 11:45 a.m.   CONSULTS:  Cardiology.   PROCEDURES:  1.  Echocardiogram.  2.  Chest x-ray.   DISCHARGE LABORATORIES:  White count 6.5, hemoglobin 8.6, hematocrit 27.4,  platelets 283.  Sodium 137, potassium 5.5 with a repeat pending at the time  of dictation, chloride 111, CO2 22, BUN 14, creatinine 1.2.  INR 3.4.  A 2-D  echocardiogram showed preserved left ventricular function and normal mitral  valve function.   HOSPITAL COURSE:  This is a 75 year old African American female who  presented with two to three days of chest tightness that was intermittent  with radiation to the left shoulder with some shortness of breath.   #1 - ATRIAL FIBRILLATION:  The patient was found to be tachycardic, going in  and out of atrial fibrillation with rates up into the 140s and 150s that  happened to occur with her chest tightness and pressure.  Her cardiac  enzymes were negative for her entire stay.  She was seen by cardiology, who  recommended  continuation of her previous Cozaar, as well as amiodarone with  discontinuation of her Procardia.  Additionally, she was continued on her  metoprolol, but it was changed to b.i.d. dosing rather than daily and the  dose was increased to 100 mg.  With these changes, the patient was rate  controlled with no further episodes of chest pressure or tachycardia.  Additionally, the patient was continued on her Coumadin stress dosed per  pharmacy.  Her final dose is as listed above.   #2 - HYPERTENSION:  The patient well-appearing found to be hypertensive on  admission with a blood pressure of 156/103.  She was restarted on her  regular home medications as above.  Over the course of her hospital stay,  her blood pressure returned to normal.  On the day of discharge, it was  119/75.   #3 - ANEMIA:  The patient has a history of anemia which seemed to remain  stable while in  the hospital.  No signs of bleed are present.  Iron studies  were done and she was noted to have an iron of 20 and a TIBC of 443 with a  percent saturation of 5.  The patient was thought to be iron deficient and  was started on iron daily at discharge.   #4 - HYPOKALEMIA:  The patient was found to be hypokalemic on admission.  The potassium was repleted.  She had been started on potassium supplements  daily while in the hospital, which made her hyperkalemic at 5.5.  On the day  of discharge, the patient was given Kayexalate and a repeat potassium was  pending at the time of this dictation.       TH/MEDQ  D:  07/18/2004  T:  07/18/2004  Job:  [id number]   cc:   Franchot Mimes, MD  Fax: [phone]   Doylene Canning. [name], M.D.

## 2010-06-07 NOTE — H&P (Signed)
NAME:  [name], [name]                          ACCOUNT NO.:  [id number]   MEDICAL RECORD NO.:  [id number]                   PATIENT TYPE:  EMS   LOCATION:  MAJO                                 FACILITY:  [facility]   PHYSICIAN:  Olga Millers, M.D. [facility]            DATE OF BIRTH:  25-Aug-1935   DATE OF ADMISSION:  06/19/2003  DATE OF DISCHARGE:                                HISTORY & PHYSICAL   [name] is a 75 year old female with a past medical history of mitral  valve replacement, atrial fibrillation, and hypertension who we are asked to  see for chest pain. I do not have any of her cardiac records available. She  is status post mitral valve replacement in 1987. There is no history of  coronary artery disease per her old chart.  She also has a permanent atrial  fibrillation. She has been followed by Dr. [name] for a history of syncope.  She is also on chronic Coumadin. She typically does not have exertional  chest pain, although she does have some dyspnea on exertion and occasional  pedal edema by her report as well as orthopnea. Today, she developed left-  sided chest pain (under the left breast), intermittent, duration of  approximately one minute, described as a sharp sensation, associated with  shortness of breath, but no nausea, vomiting, or diaphoresis.  The pain  increases with certain movements, but is not pleuritic or related to food.  Because of this she came to the emergency room and we are asked to further  evaluate. She states that she does not know the doses of her medications.  However, she is on amiodarone, Coumadin 5 mg on all days except Tuesday and  Thursdays, and she takes 2.5 mg most days. She takes Cozaar. She takes  Lopressor 75 mg p.o. daily as well as Procardia XL 30 mg p.o. daily.   SOCIAL HISTORY:  She does not smoke nor does she consume alcohol. She did  smoke in the remote past, but none in the past 18 years. She has no known  drug allergies.   FAMILY  HISTORY:  Negative for coronary artery disease. She had a sister with  bladder cancer.   PAST MEDICAL HISTORY:  Significant for hypertension, but there is no  diabetes mellitus or hyperlipidemia. She does have a history of atrial  fibrillation and also a history of mitral valve replacement.  She has had  prior cholecystectomy as well as polyps removed. She also had tubal  ligation. She does have a history of syncope as well as asthma and allergic  rhinitis.   REVIEW OF SYSTEMS:  There has been no recent headaches, fevers, chills.  There is no productive cough or hemoptysis. There is no dysphagia,  odynophagia, melena, or hematochezia. There is no dysuria or hematuria.  There is no seizure activity. She does have orthopnea, but there is no PND.  She does occasionally have  mild pedal edema. She also has some dyspnea on  exertion. The remainder of her systems are negative.   PHYSICAL EXAMINATION:  VITAL SIGNS: Today, her blood pressure is 170/100,  pulse 92. She is afebrile. She is 100% on room air.  GENERAL: She is well-developed, well-nourished, and in no acute distress.  She is somewhat anxious at the time of evaluation.  SKIN: Warm and dry.  HEENT: Unremarkable and normal.  NECK: Supple. There are no bruits noted. There is no jugular venous  distention and no thyromegaly noted.  CHEST: Clear to auscultation and normal expansion.  CARDIOVASCULAR: An irregular rhythm. There is a crisp mechanical valve  sound. I cannot appreciate a systolic murmur.  ABDOMEN: No tenderness to palpation and no hepatosplenomegaly. There is no  abdominal bruit. The remainder of the abdomen is benign.  BREASTS: She is tender in the ribs and in the left breast area.  EXTREMITIES: She has 2+ femoral pulses bilaterally and no bruits. No edema  that I could palpate. No cords. She has 2+ dorsalis pedis pulses  bilaterally.  NEUROLOGIC: Grossly intact.   Her electrocardiogram shows atrial fibrillation with a  controlled  ventricular response at 90. She has a right bundle branch block which is  old. There are no acute ST changes.   DIAGNOSES:  1. Atypical chest pain.  2. History of mitral valve replacement.  3. Permanent atrial fibrillation.  4. Hypertension.   PLAN:  [name] presents with chest pain.  It is very atypical for  ischemia and her electrocardiogram shows no ST changes. I think it is most  consistent with musculoskeletal pain. We will admit and cycle enzymes. If  they are negative then we will plan to discharge the morning and I do not  think we need to evaluate her for ischemia any further if all of her enzymes  are negative.  Of note, she does not know her own medications and I have  asked her to bring those in so that we can give her proper doses. She will  otherwise continue on the Coumadin and she is followed in our Coumadin  clinic. She will follow up with Dr. [name] at discharge. It should be noted  that her laboratories are pending at the time of this dictation and she will  need to have those followed up as well as her chest x-ray.                                                Olga Millers, M.D. [facility]    BC/MEDQ  D:  06/19/2003  T:  06/19/2003  Job:  [id number]

## 2010-06-07 NOTE — Assessment & Plan Note (Signed)
[facility]                            CARDIOLOGY OFFICE NOTE   JALEY, YAN                       MRN:          [id number]  DATE:04/07/2006                            DOB:          08/15/1935    HISTORY OF PRESENT ILLNESS:  Mrs. Danielski returns for followup today.  She is a patient that has previously been followed by Dr.  [name].  She  was recently found to have a microcytic anemia and colonoscopy reveled  polyps and a tubular adenoma.  She subsequently had a right colectomy  for cecal tubulovillous adenoma in January.  During the hospitalization,  we did evaluate the patient for her atrial fibrillation and medications  were adjusted.  She was ultimately discharged.  Since then, she denies  any dyspnea, chest pain, palpitations or syncope.  She has lost weight  and she had problems with diarrhea for which she is being seen by Dr.  Zachery Dakins.   MEDICATIONS:  1. Coumadin as directed.  2. Caltrate.  3. Protonix 40 mg p.o. daily.  4. Cardizem 180 mg p.o. daily.  5. Lopressor 75 mg p.o. b.i.d.  6. Digoxin 0.25 mg p.o. daily.   PHYSICAL EXAMINATION:  VITAL SIGNS:  Blood pressure 136/80, pulse 61,  weight 152 pounds.  CHEST:  Clear.  CARDIOVASCULAR:  Irregular rhythm.  There is a crisp mechanical valve  sound with no murmur noted.  EXTREMITIES:  No edema.   STUDIES:  Electrocardiogram shows atrial fibrillation at a rate of 61.  There is a right bundle branch block.   DIAGNOSES:  1. Permanent atrial fibrillation.  2. History of nonischemic cardiomyopathy, improved by most recent      echocardiogram in June 2006.  3. Status post mitral valve replacement.  4. Coumadin therapy.   PLAN:  Mrs. Gu is making recovery from her recent surgery.  She will  continue on Coumadin with goal INR of 2.5-3.5, and she is being followed  in our Coumadin Clinic.  Her rate appears to be controlled on the  Lopressor, Cardizem and Digoxin.  Her Amiodarone  has been  discontinued.  We will plan to repeat her echocardiogram to reassess her  mitral valve.  I will otherwise have her follow up with Dr. [name] as he  has seen her for many years.     Madolyn Frieze Jens Som, MD, [facility]  Electronically Signed    BSC/MedQ  DD: 04/07/2006  DT: 04/08/2006  Job #: [id number]

## 2010-06-07 NOTE — Op Note (Signed)
[place]  Patient:    [name], [name]                       MRN: [id number] Proc. Date: 07/09/99 Adm. Date:  [id number] Disc. Date: [id number] Attending:  Mohorn, Elmon Else                           Operative Report  INDICATION FOR PROCEDURE:  [name] presented to my office upon referral for removal of multiple teeth involved with severe periodontal disease. Considering her significant past medical history including congestive heart failure, asthma, aortic valve replacement it was determined that with the extent of the surgery required she would require mild sedation in the main operating room at [facility].  DESCRIPTION OF PROCEDURE:  The patient was brought to the operating room and placed in the supine position.  MAC anesthesia was induced.  Local anesthetic was injected into the right and left posterior maxilla as well as the left mandible. Local anesthetic included a total of 12 cc of 0.25% Marcaine with no epinephrine.  Once local anesthetic was allowed to be achieved a circular incision was made around all the following teeth:  1, 2, 3, 15, and 20.  A mucoperiosteal flap was reflected around all remaining teeth.  The following teeth were removed without complication:  1, 2, 3, 15, and 20. In the right and left posterior maxilla [name] has excessive bony and gingival tuberosities.  The excessive bone was removed and the sharp bone was smoothed. All sites were irrigated with copious amounts of normal saline irrigation. All sites were closed utilizing a 3-0 chromic suture in a running fashion. Gelfoam was placed in all extraction sites.  The contour of the posterior maxilla bilaterally were examined and found to be adequate contour. There is no excessive bleeding.  Gauze was placed over all extraction sites.  The throat pack which had been placed previously was removed.  The patient was allowed to awaken in the operating room  and transferred to the recovery room in stable satisfactory condition. DD:  07/09/99 TD:  07/11/99 Job: [id number]

## 2010-06-07 NOTE — Discharge Summary (Signed)
NAMESALLYE, LUNZ                ACCOUNT NO.:  [id number]   MEDICAL RECORD NO.:  [id number]          PATIENT TYPE:  INP   LOCATION:  5736                         FACILITY:  [facility]   PHYSICIAN:  Dorisann Frames, M.D.   DATE OF BIRTH:  Jun 19, 1935   DATE OF ADMISSION:  01/26/2006  DATE OF DISCHARGE:  02/16/2006                               DISCHARGE SUMMARY   SURGEON:  Dr. Zachery Dakins.   CONSULTATIONS:  Cardiology, Dr. Jens Som.   CHIEF COMPLAINT/REASON FOR ADMISSION:  Ms. Eggleton is a 75 year old  female patient on chronic anticoagulation secondary to atrial  fibrillation and mitral valve repair who has microcytic anemia prompting  a referral to GI services.  Colonoscopy in January 2006 did reveal at  edematous polyps and biopsy of cecum showed a fragment of tubular  adenoma.  The patient was admitted to the hospital for this admission to  undergo colonoscopy.  This was done so her Coumadin could be reversed  because Dr. Bosie Clos felt she would probably need a polypectomy at this  colonoscopy.  In regards to any symptoms prior to admission, she was  having occasional bright red blood on the toilet tissue and a 10 pound  weight loss over the past 6 months.  The patient was admitted by Dr.  Bosie Clos with the following diagnoses.   ADMISSION DIAGNOSES:  1. Elective admission for colonoscopy.  2. Chronic systemic Coumadin anticoagulation with plans for reversal      of Coumadin for colonoscopy.  3. Atrial fibrillation rate controlled.  4. Hypertension.  5. History of an St. Jude mitral valve.  6. History of asthma.   HOSPITAL COURSE:  The patient underwent colonoscopy on January 27, 2006  Dr. Bosie Clos where she was found to have a polypoid lesion in the cecum  adjacent to the ileocecal valve, prior biopsies in the past as well as a  descending colon polyp that was small and was snared.  The patient was  replaced back on diet and pathologies were pending at that time.   Pathology  subsequently returned with cecal tubulovillous adenoma and  since the polyp was somewhat large in size,  Dr. Bosie Clos requested  surgical consultation be obtained for resection on January 29, 2006.  Dr. Lindie Spruce did evaluate the patient and felt that due to the recent  bleeding and weight loss secondary and associated anemia secondary to  this lesion, that colon resection would be an option.  The patient was  seen on Thursday and, unfortunately, the patient had been solid food for  2 days, so we were unable to proceed with surgical resection within 24  hours, so plans were to keep the patient in the hospital and began a  bowel prep over the weekend and plans were to proceed with surgery the  following week   As part of the work-up, labs were reviewed and the patient did have some  mild transaminitis.  In review of E-chart, the patient had an abnormal  CT May 2007 revealing hepatic loculation possible cirrhosis, periportal  lymphadenopathy and a 3.1 cm density in the uncinate portion of the  pancreas as well as a 2 cm right hepatic lobe mass.  Because of this, CT  of the abdomen and pelvis was repeated and this showed that the lesion  on the right hepatic lobe was stable and had low attenuation.  There was  no evidence of pancreatic mass and otherwise CT was stable.  At this  point the patient was deemed appropriate for surgical intervention and  on February 03, 2006,  the patient was taken to the OR by Dr. Zachery Dakins  where she underwent a right colectomy for cecal tubulovillous adenoma.  Subsequent postoperative pathologies had returned to normal prior to  discharge except for tubulovillous adenoma.  No malignancy.   Because of her cardiac history, in the immediate postop period the  patient was placed on the telemetry step-down bed.  Her heparin was  initiated within the first 24 hours of surgery.  She was having some  small amount of bleeding around the nose area from the NG tube but no   actual bleeding directly coming out through the NG suction itself.  By  postop day #2 the patient was having bowel sounds and her NG tube was  clamped with plans to discharge in 4 hours as tolerated and this was  done on postop day #2.   On postop day #4 the patient began passing bloody stools.  She stated  that she just did not feel good.  She was having some atrial  fibrillation with rapid ventricular response.  Hemoglobin was 10.  White  count was 11,000.  She was returned the NPO status.  Heparin was placed  on hold.  Because of the increased ventricular rate, cardiology was  consulted to assist with the patient.  Dr. Gala Romney saw the patient in  Dr. Ludwig Clarks absence on February 07, 2006.  The patient's atrial  fibrillation was treated with digoxin and IV Cardizem and they were  uncertain at this point if possible bleeding and anemia (hemoglobin now  down to 8.6)  was contributing to her tachyarrhythmia.  They agreed with  holding heparin for 48-72 hours until lower GI bleeding ceased.  During  this time period, the patient also received 2 units packed red blood  cells because later that evening her hemoglobin decreased to 7.   By the next day the patient's hemoglobin was back up to 8.9.  Her  ventricular rate was controlled.  She remained in atrial fibrillation.  Her abdomen was benign and diet was reinitiated.  By February 09, 2006,  postop day #5, the patient was having bowel movements.  She had some  mild abdominal soreness, but incision was stable.  Hemoglobin was 9.8.  Heparin was resumed.  The patient did have some sporadic episodes of  tachycardia.  The Lopressor had also been added to her cardiac drug  regimen.   At this point, hemoglobin was followed daily and monitored for any signs  of decreasing.  The patient had no further bloody or dark stools and her diet was slowly advanced and she was deemed appropriate for transfer to  floor on postop day #8.  Around the same  time period, the patient did  get confused during the middle of the night, got up without assistance,  fell backwards and hit her head.  CT scan was negative.  She did  complain of some pulling at her abdominal incision site with falling and  has been having diarrhea and abdominal cramping.  Her incision was quite  tender.  She had  some bruising in the lower part of the incision which  had been present in the immediate postop period after she developed the  initial episode of postoperative bleeding while on heparin.  Because of  the diarrhea and borderline metabolic acidosis,  IV fluids were  continued at 125 an hour.  Stools were checked for C diff.  Wound was  stable so staples were discontinued and Steri-Strips applied.  By the  following day, the patient continued to have increasing abdominal pain  and watery diarrhea.  She is also complaining of pressure in the rectum  which was constant.  There was some concern that the patient either had  recurrent bleeding or abscess formation.  Hemoglobin was 9.7, white  count 8500, CO2 18, INR 1.1.  CT was ordered with oral contrast only.  CT showed no evidence of any abscess.  She did have some ascites, fluid  around the liver,  and some free fluid in the pelvis, but nothing  pathological.  By the following day the patient's diarrhea had improved.  She had been given one dose of Imodium by the on-call physician and was  tolerating diet better.  Although not mentioned before, in the immediate  postop period, the patient had a PICC line placed and had been started  on T and A.  This was weaned off as the patient began to tolerate a  solid diet.   At this point we were making sure the patient did not have C difficile  colitis.  Three collections were negative.  The patient's diarrhea  improved dramatically and she began to tolerate a solid diet without any  other problems.  Incision remained clean  , dry and intact and INR  slowly began to  increase towards the therapeutic range.  She had been  resumed on Coumadin on postop day #9.  By postop day 13,  the patient  was evaluated by cardiology.  Her INR was 1.8 and they felt she would be  appropriate for discharge home today and they have written for  appropriate Coumadin dosing with follow-up in their Coumadin clinic this  Thursday for PT/INR check and follow-up with the cardiologist in 2 weeks  thereafter.  Also important to note that she has developed significant  lower extremity edema, 1-2+ bilaterally.  She has being given a dose of  Lasix IV 20 mg prior to discharge today.   DISCHARGE DIAGNOSES:  1. Gastrointestinal bleeding secondary to tubulovillous adenoma with      no malignancy seen on pathology.  2. Status post exploratory laparotomy with right colectomy on February 01, 2006, by Dr. Zachery Dakins.  3. St. Jude prosthetic mitral valve on chronic Coumadin     anticoagulation.  4. Postoperative bleeding resolved.  5. Atrial fibrillation with recent rapid ventricular response,  rate      controlled.  6. Hypertension.  7. Asthma.  8. Anemia with hemoglobin stable at 10.1 on date of discharge,      creatinine 1.12 on date of discharge.   DISCHARGE MEDICATIONS:  1. Coumadin 7.5 mg a day prior to discharge, then 5 mg daily until INR      checked this Thursday.  2. Lopressor 75 mg twice daily.  This is a change from prior dosing.  3. Digoxin 0.125 mg daily.  This is new.  4. Cardizem 180 mg daily.  This is new.  5. Vicodin 5/325 1-2 tablets every 4 to 6 hours as need for pain.  6. Stop amiodarone/Cordarone.  7. Stop Norvasc/amlodipine.   DIET:  Heart-healthy, Coumadin restrictions   ACTIVITY:  Increase activity slowly.  May shower.  May walk up steps.  No driving while taking Vicodin.  No lifting more than 15 pounds for the  next 4 weeks.   WOUND CARE:  Allow Steri-Strips to fall off.   FOLLOW-UP APPOINTMENTS:  1. She will see Dr. Zachery Dakins in the office in  2 weeks.  Need to call      to arrange this appointment.  [phone].  2. She is to have an appointment to Dr. Jens Som February 14 at 4:15      p.m.  3. The Coumadin clinic appointment on January 31 at 3:45 p.m.      [name], N.P.    ______________________________  Dorisann Frames, M.D.    ALE/MEDQ  D:  02/16/2006  T:  02/16/2006  Job:  [id number]   cc:   Anselm Pancoast. Zachery Dakins, M.D.  Madolyn Frieze Jens Som, MD, [facility]  Shirley Friar, MD

## 2010-06-07 NOTE — H&P (Signed)
[facility]  Patient:    [name], [name]                      MRN: [id number] Adm. Date:  02/14/00 Attending:  Mena Pauls, M.D. Dictator:   Santiago Bumpers, M.D.                         History and Physical  PRIMARY CARE PHYSICIAN:  Patricia Pesa, M.D.  CHIEF COMPLAINT:  "Feeling bad."  HISTORY OF PRESENT ILLNESS:  Patient is a 75 year old African-American female with known history of atrial fibrillation on chronic Coumadin therapy who presents with approximately one day history of diffuse abdominal pain, anorexia, malaise, and shortness of breath associated with her pain.  The pain does move to her back, across her shoulder blades, and in her lower back.  She does admit to lower flank pain as well.  She denies any vomiting, but does say that she has been nauseous.  No diarrhea.  She does admit to subjective fever and chills, but has not taken her temperature.  Denies cough, runny nose, or watery eyes.  She has no recent sick contacts.  She does admit to a lower chest/upper abdominal pain which is sharp and intermittent.  She also complains of increased urinary frequency over the last one to two days.  No dysuria.  REVIEW OF SYSTEMS:  As stated in HPI and also includes no recent skin changes, no mental status changes, generalized weakness, but no focal weakness.  This patient does admit to palpitations.  PAST MEDICAL HISTORY: 1. Hypertension. 2. Atrial fibrillation. 3. Allergic rhinitis. 4. Asthma. 5. History of syncope. 6. History of congestive heart failure. 7. History of a prosthetic mitral valve (1987).  PAST SURGICAL HISTORY: 1. Mitral valve replacement 1987. 2. Cholecystectomy 1994. 3. Colon polypectomy 1998. 4. Bilateral tubal ligation, distant.  PROCEDURE: 1. Cardiolite in July 2000 showed an ejection fraction of 43%, negative for    ischemia. 2. Carotid Dopplers in July 2000 were negative. 3. Spirometry in August 2000  showed a forced vital capacity of 64% of    predicted, an FEV1 of 64%.  MEDICATIONS: 1. Aciphex 20 mg per day. 2. Amiodarone 200 mg per day. 3. Coumadin 5 mg on Monday, Wednesday, and Friday, 2.5 mg on other days. 4. Hyzaar 50/12.5 mg per day. 5. Lopressor 75 mg per day. 6. Nifedipine 30 mg per day. 7. Ventolin two puffs q.i.d. 8. Nasocort one puff q.d.  SOCIAL HISTORY:  Patient has a remote history of tobacco abuse and is not currently smoking.  No alcohol or drugs.  She is divorced, lives alone in [place], and has three children.  ALLERGIES:  No known drug allergies.  FAMILY HISTORY:  Sister with bladder cancer.  Mother with hypertension and cerebrovascular disease.  Father died of prostate cancer.  PHYSICAL EXAMINATION:  VITAL SIGNS:  Blood pressure 123/66, pulse 74, respirations 11, temperature 98.3, oxygen saturation 99% on room air.  GENERAL:  She was a well-developed, slightly obese African-American female in no acute distress.  She did appear mildly ill.  She was alert and oriented x 4.  HEENT:  Pupils are equal, round and reactive to light and accommodation. Head:  Atraumatic, normocephalic.  Mucous membranes:  Moist and pink.  NECK:  No lymphadenopathy.  No thyromegaly.  No tenderness.  CHEST:  Lungs were clear to auscultation bilaterally with nonlabored respirations.  No tenderness to palpation of the  chest.  CARDIOVASCULAR:  Irregular rhythm with a rate in the 80s-90s.  No murmur noted.  The S1 was crisp, S2 normal.  ABDOMEN:  Soft abdomen which was nontender to palpation.  No distention.  No hepatosplenomegaly.  No masses or pulsations.  EXTREMITIES:  Edema 1+ bilaterally to the upper ankles.  Dorsalis pedis and posterior tibial pulses could not be palpated.  There was no clubbing or cyanosis.  RECTAL:  No stool in vault.  No tenderness.  Guaiac negative.  BACK:  No flank tenderness.  No tenderness of vertebral bodies or  shoulder blades.  LABORATORIES:  CBC:  White blood cell count 7.1, hemoglobin 11.0, platelets 207,000.  Complete metabolic panel:  Sodium 138, potassium 3.2, chloride 111, bicarbonate 23, BUN 25, creatinine 1.7, glucose 103, calcium 8.7.  AST 41, ALT 41, alkaline phosphatase 59, total bilirubin 0.8.  PT 29.2, INR 4.3, PTT 54. CK 83, CK-MB 0.4, troponin I 0.01.  EKG done in clinic at [facility] the afternoon of presentation showed atrial fibrillation with rapid ventricular response with a rate of 101. Also had a right bundle branch block.  This bundle branch block was also present on a previous EKG from 1995.  Repeat EKG in the emergency department today showed atrial fibrillation with a ventricular rate of 96 with left axis deviation and again with right bundle branch block.  No changes compared to the prior EKG in clinic.  ASSESSMENT AND PLAN: 1. This 75 year old African-American female with history of atrial    fibrillation presents with some noncardiac chest pain, epigastric and back    pain, and generalized malaise for approximately one day.  This symptoms    picture is concerning in a patient with previous heart disease, especially    with upper epigastric/lower chest pain.  Will need to rule out myocardial    infarction and observe on telemetry bed.  Also, will consider infectious    causes of her present illness including urinary tract infection, pneumonia,    and will keep in mind the possibility of infective endocarditis in this    woman with prosthetic valve.  This also could be a viral syndrome that    could have caused her to go into atrial fibrillation with rapid ventricular    response and feel ill the last day.  Lastly, will consider possibility of    abdominal aortic aneurysm given epigastric pain in this hypertensive woman    also with general malaise.  In addition to the laboratories already drawn,    will followup cardiac enzymes.  Will obtain a chest  x-ray, CT scan of the    abdomen. 2. Supertherapeutic INR.  INR is 4.3 now.  Will hold Coumadin and recheck PT    in the morning.  3. Hypokalemia.  Potassium 3.2 on presentation.  Will replace with K-Dur and    also add potassium to fluids.  Will recheck basic metabolic panel in the    morning. 4. Atrial fibrillation with rapid ventricular response.  Continue to monitor    on telemetry.  Continue home beta blocker, amiodarone, and recheck EKG in    the morning.  Also, follow-up on TSH as this could be an etiology of change    in ventricular response to her atrial fibrillation. DD:  02/14/00 TD:  02/14/00 Job: [id number]

## 2010-06-07 NOTE — Consult Note (Signed)
NAME:  [name], [name]                ACCOUNT NO.:  [id number]   MEDICAL RECORD NO.:  [id number]          PATIENT TYPE:  INP   LOCATION:  4704                         FACILITY:  [facility]   PHYSICIAN:  [name], M.D. LHCDATE OF BIRTH:  [birth year]   DATE OF CONSULTATION:  07/16/2004  DATE OF DISCHARGE:                                   CONSULTATION   HISTORY OF THE PRESENT ILLNESS:  [name] is a 75 year old patient of Dr.  Olga [name] with a past medical history of mitral valve replacement,  permanent atrial fibrillation, nonspecific cardiomyopathy, hypertension,  mild increase in liver functions, and anemia whom we were asked to evaluate  for chest pain.  The patient is status post mitral valve replacement with a  St. Jude valve in 1987.  Her last cardiac catheterization in September 1999  showed a 50-60% AV circumflex branch and a 20-30% right coronary artery.  The ejection fraction was 25%.  Her most recent nuclear study  in 2005  showed an ejection fraction of 566, but no ischemia.  Previous  echocardiograms have revealed decreased LV function.  The patient also has  permanent atrial fibrillation.   Over the past three days she has complained of chest pain.  It radiates to  the back and in the substernal area.  It is described as a pressure.  There  is associated shortness of breath and diaphoresis, but there is no nausea or  vomiting.  The pain has a questionable pleuritic component, but also  increases with exertion is improved with rest.  She also complains of  weakness.  She was admitted for these symptoms.  It should be noted that  they last for approximately 30 minutes at a time.  She also had an episode  in the hospital that was associated with atrial fibrillation with an  increased heart rate.  She has ruled out for myocardial infarction and  cardiology has been asked to further evaluate.  Of note, she also has had  some dyspnea on exertion, but there is no orthopnea,  PND, pedal edema,  palpitations, or syncope.   MEDICATIONS:  The patient's medications include:  1.  Amiodarone  200 mg 2 daily.  2.  Coumadin as directed.  3.  Cozaar 25 mg p.o. daily.  4.  Lopressor 75 mg p.o. daily.  5.  Procardia XL 30 mg p.o. daily.   ALLERGIES:  The patient has no known drug allergies.   PAST MEDICAL HISTORY:  There is no diabetes mellitus or hyperlipidemia, but  there is hypertension.  She has a history of bronchospastic lung disease.  She has a history of dilated cardiomyopathy and has had previous mitral  valve replacement as described in the HPI.  She has permanent atrial  fibrillation. She does  have a history of reflux disease.  She has had a  prior cholecystectomy and colonic polyps.  There is also a history of  anemia.  She has a history of mildly increased liver functions.   SOCIAL HISTORY:  The patient has a remote history of tobacco use, but none  in  the recent past.  She also does not consume alcohol at the present time.   FAMILY HISTORY:  The patient's mother had a CVA and had hypertension.  Her  father had prostate cancer.  She has a sister with bladder cancer.   REVIEW OF SYSTEMS:  The patient denies any headaches, fevers or chills.  There is no productive cough or hemoptysis.  There is no dysphagia,  odynophagia, melena or hematochezia by her report.  She had her last  colonoscopy performed four months ago.  There is no dysuria, hematuria,  frequency, urgency, or __________  activity.  There is no orthopnea, PND or  pedal edema.  The remaining systems are negative.   PHYSICAL EXAMINATION:  VITAL SIGNS:  The physical exam today shows a blood  pressure of 119/85 and her pulse is 92.  She is afebrile.  GENERAL APPEARANCE:  The patient is well-developed and well-nourished, and  in no acute distress.  The patient does appear somewhat anxious at the time  of the evaluation.  SKIN:  The patient's skin is warm and dry.  There is no peripheral  clubbing.  HEENT:  The head, eyes, ears, nose and throat is unremarkable with normal  eyelids.  NECK:  The patient's neck is supple with normal upstroke bilaterally and  there are no bruits.  There is no jugular venous distention and no  thyromegaly.  CHEST:  The chest is clear to auscultation with normal expansion.  HEART:  The patient's cardiovascular exam reveals an irregular rhythm.  There is a crisp mechanical bowel sounds, but I cannot appreciate any  diastolic or systolic murmurs.  ABDOMEN:  The abdominal exam is nontender with positive bowel sounds.  No  hepatosplenomegaly and no masses appreciated.  There is no abdominal bruit.  She has 2+ femoral pulses bilaterally and no bruits.  EXTREMITIES:  The patient's extremities show no edema that I can palpate.  No cords.  She has 2+ dorsalis pedis pulse bilaterally.  NEUROLOGIC:  The neurological exam is grossly intact.   LABORATORY DATA:  The patient's laboratories show a white blood cell count  of 5.6 with a hemoglobin of 9.1 and a hematocrit of 29.2.  Her platelet  count is 276,000.  MCV is 71.5.  INR is 3.6.  Sodium 137 with a potassium of  3.3, BUN and creatinine are 6 and 1.5.  Her liver functions are mildly  elevated with an SGOT of 76 and an SGPT of 48.  Her enzymes are negative  with a troponin I of 0.03 and 0.02.  Her electrocardiogram shows atrial  fibrillation with a right bundle branch block.  There are no new ST changes  noted.  Her chest x-ray shows no active disease.   DIAGNOSES:  1.  Chest pain.  2.  Permanent atrial fibrillation.  3.  Status post mitral valve replacement with a St. Jude valve.  4.  History of nonischemic cardiomyopathy.  5.  History of hypertension.  6.  Mildly elevated liver functions most likely related to amiodarone.  7.  Amiodarone and Coumadin therapy.  8.  Microcytic anemia.   PLAN:  [name] presents with chest pain of uncertain etiology.  I wonder if this may be related to her  elevated heart rate and atrial fibrillation.  She has been on amiodarone for a long duration.  This helps with her atrial  fibrillation and rate control, but there is also a history of nonsustained  ventricular tachycardia.  We will continue with this  medication for now  unless Dr. [name] feels otherwise.  She is on Lopressor and we can increase  this to 75 mg p.o. twice a day both for rate control and also her decreased  LV function.  I will plan to change the Toprol once her dose is adjusted.  I  would discontinue her Procardia and resume Cozaar for her decreased LV  function.  We will  plan to recheck her echocardiogram to quantify LV function and also assess  her mitral valve.  Once her heart rate is improved we can plan to proceed  with an adenosine Cardiolite for risk stratification.  We will continue with  her Coumadin.  We will also need to check a TSH.       BC/MEDQ  D:  07/16/2004  T:  07/17/2004  Job:  [id number]

## 2010-06-07 NOTE — Discharge Summary (Signed)
[place]. [facility]  Patient:    [name], [name]                       MRN: [id number] Adm. Date:  [id number] Disc. Date: 07/18/99 Attending:  Willow Ora Dictator:   Gwenlyn Perking, M.D. CC:         Lavonia Dana, M.D.             Rudene Christians. [name], M.D. [facility]             Jyothi Elsie Amis, M.D.             Mardene Celeste Lurene Shadow, M.D.             Charlaine Dalton. Sherene Sires, M.D. [facility]                           Discharge Summary  ADMITTING DIAGNOSIS:  Bleeding gums on chronic anticoagulation (Coumadin status post dental extraction).  DISCHARGE DIAGNOSES:  Bleeding, resolved.  DISCHARGE MEDICATIONS:  1. FESO 4 325 mg one p.o. b.i.d.  2. Amiodarone 200 mg one p.o. q.d.  3. Metoprolol 50 mg 1-1/2 tablets p.o. q.d.  4. Aciphex 20 mg one p.o. q.a.c. breakfast.  5. Hyzaar 50/12.5 one p.o. q.d.  6. Nifedipine Extended Release 30 mg one p.o. q.d.  7. Albuterol two puffs q.4h. if needed.  8. Trazodone 50 mg one p.o. q.h.s. sleep problems p.r.n.  9. Pulmicort, Nasacort.  The patient is not to take these two medicines for     one week, then she is to resume as directed. 10. Coumadin.  The patient is instructed not to take any pills unless directed     to do so by the coag clinic.  SERVICE:  Conservation officer, historic buildings.  RESIDENTS:  Talmage Nap, M.D., Gwenlyn Perking, M.D.  PRIMARY CARE PHYSICIAN:  Lavonia Dana, M.D.  CONSULTS:  None.  PROCEDURES:  None.  HISTORY OF PRESENT ILLNESS:  Patient is a 75 year old female with a history of atrial fibrillation and MVR who had teeth extracted one week prior to presentation on July 16, 1999.  She had been to the emergency room at [facility] x 4 for bleeding x 4 off and on since then.  She had mouth packed in Gelfoam in the ED without resolution of the bleeding.  Patient also complains of weakness on the day of admission, no history of falling.  Patient had received Lovenox last week per Dr. Retta Mac at the  Coumadin clinic as well.  REVIEW OF SYSTEMS:  CONSTITUTIONAL:  Some dizziness.  CARDIOVASCULAR:  No chest pain, palpitation.  RESPIRATORY:  No shortness of breath, no cough. GI:  No nausea, vomiting, diarrhea.  SKIN:  No rashes.  NEUROLOGIC:  No focal complaints.  MUSCULOSKELETAL:  Weakness all over.  EYES:  No visual changes. ENT:  See history of present illness.  GU:  No urinary complaints. HEMATOLOGIC:  See history of present illness.  PAST MEDICAL HISTORY/CHRONIC PROBLEM LIST:  1. Hypertension.  2. Atrial fibrillation.  3. Allergic rhinitis.  4. "Asthma."  MEDICATIONS ON PRESENTATION:  Amiodarone, Hyzaar, K-Dur, Lopressor, Nasacort, Pulmicort, Darvocet, Aciphex, and Coumadin.  Coumadin on presentation was dosed as follows:  5 mg Monday, Wednesday, Friday; 2.5 mg on the other days. Patient is also on Procardia 30 mg p.o. q.d.  ALLERGIES:  No known drug allergies.  SOCIAL HISTORY:  Remote history of tobacco abuse; nonsmoker currently.  No  alcohol.  Patient is divorced and lives alone in [place]; three children.  PAST SURGICAL HISTORY:  Mitral valve replacement, 1987.  Cholecystectomy, 1994.  Cardiolite, 2000, ejection fraction 43%, negative for ischemia.  Tilt table, July 2000, no syncope or arrhythmia.  Carotid Doppler, July 2000, negative.  Patient has a history of tubal ligation.  Patient has negative skin prick and intradermal testing per allergist.  Spirometer testing, August 2000, FVC of 64% predicted, FEV1 64% predicted.  FEV1/FVC ratio was 79%.  FAMILY HISTORY:  Sister has bladder cancer.  Mother has hypertension and history of stroke.  Father died of prostate cancer.  PHYSICAL EXAMINATION ON PRESENTATION:  VITAL SIGNS:  Blood pressure 121/85, temperature 98, heart rate 98, respiratory rate 20.  CONSTITUTIONAL:  Mild distress.  HEENT:  Within normal limits except for a packing in place in the mouth. Reports constant oozing from right and left canines and  molars, upper.  RESPIRATORY:  Clear to auscultation.  NECK:  No lymphadenopathy.  CARDIOVASCULAR:  Positive click/valves.  EXTREMITIES:  No clubbing, cyanosis, or edema.  ADMISSION LABORATORY DATA:  PT 26.5, INR 3.9, PTT 41.  ASSESSMENT AND PLAN:  A 75 year old female with constant bleeding from tooth extraction.  1. Mouth bleeding.  Dr. Retta Mac is seeing the patient currently.  He will     suture site and pack with Gelfoam.  Patient needs adjustment of INR and     discussed with Dr. Andee Lineman at [facility].  Will hold Coumadin for now and     consult cardiology, consider fresh frozen plasma if needed.  2. Atrial fibrillation/mitral valve replacement/history of St. Judes valve.     INR goal is 2.5 to 3.5.  Will need to try to lower her INR if possible,     get rate control on Procardia, metoprolol, amiodarone.  Will give Trimox     tonight, given nausea and vomiting, and sutures.  3. Asthma.  Continue Pulmicort and albuterol p.r.n.  HOSPITAL COURSE:  The patient was admitted with the above assessment and plan. She had daily PTs and INRs while hospitalized.  On July 17, 1999, her INR was 4.2.  On June 28, day of discharge, her INR continued to trend down to 4.0. Her Coumadin was held.  The bleeding/oozing from her gums subsided.  Her other medical problems were well controlled on her outpatient regimen.  The Pulmicort as well as the Nasacort were held and, on July 18, 1999, it was decided that patient had benefited maximally from this hospital admission and could be discharged home safely after appropriate follow-up with the Coumadin clinic was arranged for the patient.  DISCHARGE CONDITION:  Stable.  DISPOSITION:  Discharge patient home.  ACTIVITY:  No restrictions.  DIET:  Low fat, low salt.  The patient is to eat one banana per day.   WOUND CARE:  Not applicable.  She is to take care of her mouth wound as directed by Dr. Retta Mac.  SPECIAL INSTRUCTIONS:  If bleeding worsens  again, she is to come back to the [facility] emergency department.  FOLLOW-UP:  The patient is to follow up with coag clinic on July 19, 1999, at 12:15.  She is instructed that it is very important for her to keep this appointment. DD:  07/18/99 TD:  07/18/99 Job: [id number]

## 2010-06-07 NOTE — Op Note (Signed)
NAMESYNDI, PUA NO.:  [id number]   MEDICAL RECORD NO.:  [id number]          PATIENT TYPE:  INP   LOCATION:  2550                         FACILITY:  [facility]   PHYSICIAN:  Anselm Pancoast. Weatherly, M.D.DATE OF BIRTH:  [birth year]   DATE OF PROCEDURE:  02/03/2006  DATE OF DISCHARGE:                               OPERATIVE REPORT   PREOPERATIVE DIAGNOSIS:  1. 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  2. On anticoagulation for mitral valve replacement.  3. History of cirrhosis.   POSTOPERATIVE DIAGNOSIS:  1. 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  2. On anticoagulation for mitral valve replacement.  3. History of cirrhosis.   OPERATION:  Right colectomy.   SURGEON:  Anselm Pancoast. Zachery Dakins, M.D.   ASSISTANT:  Gabrielle Dare. Janee Morn, M.D.   ANESTHESIA:  General.   HISTORY:  [name] is a 75 year old female who was seen by Dr. Lindie Spruce  last week after she was admitted on the GI service of Dr. Bosie Clos,  primary care her family physicians, and she has anemia with blood loss.  She had a St. Jude valve in 1987.  She used a have a history of  significant alcohol use but not in the last 20 years, according to the  patient, and she has definitely old cirrhotic liver.  The liver, itself,  is bigger than the compacted cirrhosis.  She has had colonoscopy with  the findings of a lesion in the cecum close to the ileocecal bowel that  was biopsied and is not a frank malignancy and we were asked to see her  for surgical resection.  She had a CT and this showed a questionable  lesion in the right lobe of the liver that was noted there a year  earlier and then they questioned, this time, whether there was something  possibly in the pancreas and she had an extensive workup over the  weekend with additional CTs and etc., a PICC line was placed and I think  that they had originally scheduled for me to operate on her Monday, but  we postponed it as these other tests were  being performed.  The area on  repeat CT did not confirm that there is a lesion in the pancreas. The  area on the liver that was noted appears actually smaller, it is not  larger compared with x-rays a year earlier, and she is back on the  schedule now for a right colectomy.  She is an old known alcohol user,  but is not drinking presently and her hemoglobin has dropped requiring 2  units of blood two days ago and it is back down to the 8.5 range again  today.  She chronically is on Coumadin and that has been switched to  heparin and we discontinued the heparin this morning at approximately  4:00 a.m. in preparation for right colectomy.  She has undergone a  mechanical and antibiotic bowel prep in preparation for the surgery.  This morning, her hemoglobin was about 8.5 and we gave her 2 units of  bloody interoperatively.   DESCRIPTION OF PROCEDURE:  The patient,  preoperatively, was given 3  grams of Unasyn.  She had PAS stockings.  She was positioned on the OR  table and given general anesthesia.  A small midline incision was made.  Her liver is large, you can feel it down 3-4 fingerbreadths below the  costal margin.  Upon carefully entering into the peritoneal cavity, she  had adhesions down from the lower aspect of the omentum to the top her  bladder from a GYN procedure.  These were carefully taken down and  required a lot of tying of the little vessels within the omentum because  of her probably increased vascularity.  As far as the liver itself, it  is not a firm minimally cirrhotic contracted liver but is not a florid  acute liver problem, either.  She has had a previous cholecystectomy.   First, the omentum which was adherent to the top part of her bladder was  freed up. Good hemostasis was obtained, we thought, but later required  sutures in the peritoneal rough area where these adhesions had been  taken down for better hemostasis at the completion of surgery.  The  ileocecal  valve was identified. The cecum was mobilized as was the  ascending colon, the hepatic flexure, rotating everything into the  incision.  The omentum is fairly adherent to the transverse colon, not  as much normal colon exposed as we usually seen, and we took a small  portion of the omentum over on the right side that was attached to the  hepatic flexure area but left a predominant portion of the transverse  colon omentum.   Next, after the colon was rotated into the incision, I really picked the  area that we were going to do the transection to the right of the middle  colic but close to the middle of the transverse colon and the hepatic  flexure had been rotated into the field and then we went down and  started dividing the mesentery about 6 inches from the ileocecal valve  so we could take the blood supply to the ileocecal area, since that was  where the lesion was.  There were no obvious nodes that were enlarged,  you could feel a little thickening in the ileocecal valve area that I  think was the lesion and then the more hepatic flexure area, etc.,  looked like just normal colon.  The mesentery was divided using 2-0 silk  ties on the pedicles and the right branch of the middle colic was  transected and ligated.  I elected to do the GIA TA-60 anastomosis after  I first fired the GIA then opening it did require a couple sutures of 3-  0 silk for better hemostasis and then used a TA-60 to divide in a kind  of triangulation and there was a good 2-3 finger lumen size.  The little  mesenteric defect was closed with interrupted sutures of 3-0 silk and  the colon is lying nicely.  The omentum was placed over this, I think we  got a good watertight anastomosis.   I had first started working on the left side and palpation of the liver  and etc., I could not feel or see anything that looked like metastatic  disease.  She has got a definite cirrhotic liver and then, Dr. 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  The actual area now is more Christmas  tree type shape than the lesion when it was first noted a year ago on  the CT and has been relatively stable on the two CT scans.  There was a  little more bleeding up in the hepatic flexure area where we mobilized  everything and the peritoneum and the little vessels that had been  divided with the LigaSure and the larger main vessels had all been tied  with 2-0 silk sutures.  The omentum was placed back over the  anastomoses.  The area in the bladder was continuing to bleed and  required suturing this with interrupted sutures of 2-0 silk.  This is a  peritoneal area that basically was old scarring from previous GYN  surgery.  The posterior bed where we did mobilize we could see what I  think was the ureter, I did not reflect it up but care was taken not to  go retrograde in the retroperitoneal area on the right side.   The midline incision was closed with a double looped 0 PDS and the skin  was closed with staples.  The patient tolerated the procedure nicely.  She does have a nasogastric tube in which we will need to continue  because of  manipulation of the intestine, etc., and we are going to hold off on any  type of heparin until at least tomorrow morning and then probably start  her back on a low dose heparin with no bolus for protection of her  mitral valve.  We will also keep her on antibiotics for about 24 hours  at least because of her mitral valve replacement.           ______________________________  Anselm Pancoast. Zachery Dakins, M.D.     WJW/[id number]  D:  02/03/2006  T:  02/03/2006  Job:  [id number]   cc:   Shirley Friar, MD  Doylene Canning. [name],  MD

## 2010-06-07 NOTE — Consult Note (Signed)
[place]. [facility]  Patient:    [name], [name]                       MRN: [id number] Proc. Date: 05/17/99 Adm. Date:  [id number] Attending:  Lawernce Ion. [name], M.D. [facility]             Cindra Eves, M.D.                          Consultation Report  DATE OF BIRTH:  1935/10/26  REQUESTING PHYSICIAN:  Rudene Christians. [name], M.D.  INTRODUCTION:  [name] is a 75 year old black female referred by Dr. Quentin Mulling for a dental consultation.  The patient was admitted on May 11, 1999 for a history of left-sided epistaxis.  The patient is on chronic Coumadin therapy for chronic atrial fibrillation.  Dental consultation was requested for evaluation of dentition as possible source of infection, which could affect the patients systemic health.  PAST MEDICAL HISTORY: 1. Epistaxis--left side and reason for this admission. 2. Chronic atrial fibrillation on chronic Coumadin therapy. 3. Status post aortic valve replacement in 1987 secondary to rheumatic heart    disease--on chronic Coumadin therapy. 4. Nonobstructive coronary artery disease with an ejection fraction of 30-35%    in September 1999. 5. History of congestive heart failure. 6. Hypertension. 7. Asthma. 8. Anemia. 9. History of syncope.  ALLERGIES/ADVERSE DRUG REACTIONS:  None known.  CURRENT MEDICATIONS:  (Per MAR). 1. Coumadin 2.5 mg daily. 2. Procardia XL 30 mg daily. 3. Theragran M multivitamin 1 daily. 4. Augmentin 875 mg twice daily. 5. Lopressor 50 mg every 12 hours. 6. Hydrochlorothiazide 12.5 mg daily. 7. Cozaar 50 mg daily. 8. Protonix 40 mg daily. 9. Cordarone 200 mg daily.  SOCIAL HISTORY:  The patient is a nonsmoker and a nondrinker.  FUNCTIONAL ASSESSMENT:  The patient was independent for all ADLs prior to this admission.  FAMILY HISTORY:  Noncontributory.  REVIEW OF SYSTEMS:  (Reviewed from the history and physical, health history assessment form,  and chart--this admission).  DENTAL HISTORY:  CHIEF COMPLAINT:  Dental consultation was requested for evaluation of dentition as a source of infection, which could affect the patients systemic health.  HISTORY OF PRESENT ILLNESS:  Dental consultation was requested for evaluation of dentition.  The patient currently denies acute dental pain.  The patient does give a history of some discomfort of the upper left molar for unknown reasons.  The patient denies the presence of abscess or soft tissue problems.  The patient last saw a private dentist (Dr. Tristan Schroeder) a few months ago and was told "that she needed a filling."  The patients last dental cleaning was about five to six months ago.  The patient routinely sees the dentist on an every-six-months basis.  The patient does take antibiotic premedication prior to invasive dental procedures.  DENTAL EXAMINATION:  GENERAL:  The patient is a well-developed, well-nourished black female in no acute distress.  HEENT:  There is no palpable lymphadenopathy.  There are no acute TMJ symptoms.  INTRAORAL EXAMINATION:  The patient has normal saliva.  There is no presence of soft tissue pathology or abscess formation.  PERIODONTAL:  The patient has chronic periodontal disease with plaque and calculus accumulation/accretions, generalized gingival recession, and moderate bone loss.  DENTITION:  The patient has multiple missing teeth numbers 4, 5,  12-14, 16, 17-19, and 28-32.  There are multiple diastemas which are noted.  DENTAL CARIES:  There is a possible dental carious lesion on tooth #2.  ENDODONTIC:  There is a previous history of some sensitivity to an upper left molar.  The patient currently denies acute pulpitis symptoms.  There is no evidence of a periapical radiolucency.  The patient has had a previous root canal therapy to tooth #7 and there is no evidence of persistent periapical pathology.  CROWN AND BRIDGE:  There is no history of  crown or bridge restorations.  PROSTHODONTIC:  The patient has a history of an upper and lower acrylic partial denture.  The patient no longer wears these acrylic partial dentures due to ill-fitting nature.  The patient has not worn the partial dentures for the past six to seven years.  Previous partial dentures were made by Dr. Gala Romney (private dentist).  OCCLUSION:  The patient has a poor occlusal scheme secondary to multiple missing teeth, supraeruption and drifting of the unopposed teeth into the edentulous areas, and lack of replacement of the missing teeth with dental prostheses.  RADIOGRAPHIC INTERPRETATION:  There are multiple missing teeth; numbers 4, 5, 12-14, 16, 17-19, and 28-32.  There are multiple diastemas which are noted. There is supraeruption and drifting of the unopposed teeth into the edentulous areas.  There is moderate horizontal and vertical bone loss noted.  There is radiographic calculus which is noted.  There are multiple amalgam restorations noted.  There is possible dental caries affecting the distal left tooth #2.  ASSESSMENT:  1. Plaque and calculus accumulation/accretions.  2. Chronic periodontal disease with moderate bone loss.  3. Generalized gingival recession.  4. Dental caries.  5. Previous root canal therapy of tooth #7 without periapical pathology at     this time.  6. Multiple missing teeth.  7. Multiple diastemas.  8. Supraeruption and drifting of the unopposed teeth into the edentulous     areas.  9. Lack of replacement of the missing teeth with dental prostheses. 10. Poor occlusal scheme. 11. Need for subacute bacterial endocarditis antibiotic prophylaxis prior to     invasive dental procedures. 12. Possible need for adjustment of Coumadin prior to dental extractions and     possibly prior to periodontal therapy.    PLAN/RECOMMENDATIONS:  1. Discussed the risks, benefits, and complications of various treatment     options with the  patient in relationship to her medical and dental     conditions including previous valve replacement, risk for subacute     bacterial endocarditis, anticoagulation and risk for bleeding, and need     for oral hygiene improvement.  Discussed subtotal extractions with     alveoloplasty with adjustment of the Coumadin therapy, periodontal     therapy, dental restorations, root canal therapy, implant therapy, crown     and bridge therapy, and fabrication of definitive dental prostheses after     adequate healing.  We will need to obtain prior approval from [facility] as     indicated for dental procedures which require prior approval.  We will     consider referral to an oral surgeon for any surgical extractions.  The     patient is willing to proceed with dental extractions in the future,     after a comprehensive treatment plan is formulated for the patient.  The     patient is to be discharged and rescheduled for an appointment in the     hospital dental  clinic in one to two weeks.  2. We will utilize antibiotics for subacute bacterial endocarditis     prophylaxis prior to invasive dental procedures, as per AHA     guidelines.  3. Discussion of findings with Dr. Sharrell Ku as indicated.  4. Referral to oral surgeon as indicated for surgical extractions and     alveoloplasty.  5. Submission of prior approval for upper and lower cast partial     dentures and ability to override the 10-year limitation previously     described by the patient.  6. We will initiate chlorhexidine rinse 0.12%  Directions being:  Rinse     with 15 mL twice daily after breakfast and at bedtime.  Spit out the     excess.  Do not swallow. DD:  05/17/99 TD:  05/17/99 Job: [id number]

## 2010-06-07 NOTE — Discharge Summary (Signed)
[place]  Patient:    [name], [name]                       MRN: [id number] Adm. Date:  [id number] Disc. Date: 02/16/00 Attending:  Sanjuana Letters Dictator:   Raynelle Jan, M.D. CC:         Patricia Pesa, M.D.  Hedwig Morton. Juanda Chance, M.D. [facility]   Discharge Summary  DATE OF BIRTH:  Mar 15, 1935  DISCHARGE DIAGNOSES: 1. Atrial fibrillation with a rapid ventricular response. 2. Epigastric and back pain of uncertain etiology. 3. Mitral valve replacement with a supratherapeutic INR.  DISCHARGE MEDICATIONS: 1. Amiodarone 200 mg p.o. q.d. 2. Aciphex 20 mg p.o. q.d. 3. Cozaar 25 mg p.o. q.d. 4. Lopressor 75 mg p.o. q.d. 5. Nasocort one puff q.d. 6. Nifedipine 30 mg p.o. q.d. 7. Azmacort three puffs b.i.d. 8. Ventolin two puffs q.i.d. 9. Coumadin.  Patient is to hold her Coumadin until her INR is rechecked at    the family practice center the first part of next week.  LABORATORIES:  A CT of the abdomen was obtained on February 14, 2000 which was negative.  FOLLOW-UP:  She is to follow-up at the family practice center on Monday, January 28 or Tuesday, January 29 to have her INR checked.  She is to hold her Coumadin until this is done.  She is to follow-up with Dr. Lance Bosch in one week for followup of her abdominal pain, atrial fibrillation, and hypertension. She is to call Dr. Delia Chimes office for an appointment to continue workup on this abdominal pain.  This can be done in the next one to two weeks.  Of note, patient is also due for her colonoscopy to follow-up due to her history of colonic polyps.  HISTORY:  [name] is a 75 year old female who was admitted on the 25th with malaise, decreased p.o. intake, shortness of breath, and diffuse abdominal pain radiating to the back and shoulder.  PHYSICAL EXAMINATION:  HEART:  She was noted to be in atrial fibrillation with a rapid ventricular response.  LUNGS:  Clear to auscultation  bilaterally.  ABDOMEN:  Significant for some moderate epigastric tenderness, but no masses, rebound, guarding, and normal bowel sounds.  LABORATORIES:  CBC significant for hemoglobin of 11 and hematocrit of 33.5. Comprehensive metabolic panel was significant for a potassium of 3.2 and albumin of 3.3.  Her INR on admission was found to be 4.3.  EKG showed atrial fibrillation with a rapid ventricular response at approximately 100-110 and a right bundle branch block.  HOSPITAL COURSE: #1 - CARDIAC:  She was placed back on her cardiac medications with good response of the ventricular rate.  She was ruled out for an MI with negative enzymes x 3 and her shortness of breath rapidly improved.  #2 - MITRAL VALVE REPLACEMENT:  Patient is on chronic Coumadin therapy and was noted to have a supratherapeutic INR on admission.  This was followed and even on the day of discharge her INR was still 4.4 despite her Coumadin being held. She had no signs of active bleeding, therefore, it was felt that she was still safe for discharge.  However, this will need to be followed closely with frequent INR checks at the family practice center.  #3 - ABDOMINAL PAIN:  Patients examination remained benign.  She had no nausea, vomiting, or diarrhea and was able to tolerate a regular p.o. diet.  A CT of the abdomen  was obtained which was normal.  Even at the time of discharge the cause of her abdominal and back pain remained unclear.  However, patient stated that this was somewhat similar to her reflux for which she was on Aciphex for.  At the time of discharge she was to remain on her Aciphex and will need continued followup to determine the full etiology of this pain as an outpatient.  Certainly, the differential includes GERD, possibly musculoskeletal if it seems to be exacerbated by movement, or GU.  Her UAs in the hospital were significant for some nitrite, but otherwise negative.  She did have a urine  culture which was still pending at the time of discharge and will need to be followed up on by her primary M.D. DD:  02/16/00 TD:  02/16/00 Job: [id number]

## 2010-06-07 NOTE — Op Note (Signed)
[name], [name]                ACCOUNT NO.:  [id number]   MEDICAL RECORD NO.:  [id number]          PATIENT TYPE:  INP   LOCATION:  6734                         FACILITY:  [facility]   PHYSICIAN:  Shirley Friar, MDDATE OF BIRTH:  1935/07/30   DATE OF PROCEDURE:  01/27/2006  DATE OF DISCHARGE:                               OPERATIVE REPORT   PROCEDURE:  Colonoscopy.   INDICATIONS FOR PROCEDURE:  History of polyps.   MEDICATIONS:  Fentanyl 125 mcg IV, Versed 13 mg IV, ampicillin 2 grams  IV times one, gentamicin 80 mg IV times one, 7 mL of normal saline  submucosal injection.   FINDINGS:  Rectal exam was normal.  A pediatric adjustable colonoscope  was inserted into an adequately prepped colon and advanced to the cecum  where the ileocecal valve and appendiceal orifice were identified.  In  order to reach the cecum the patient had to be turned in the supine  position and abdominal pressure had to be applied as well as repeated  loop reduction.  In the cecum was a 1 cm polypoid lesion adjacent to the  ileocecal valve that was biopsied multiple times.  Prior to biopsying 7  mL of normal saline was injected submucosally to see if it would raise  up off the wall but it did not raise off the wall very much.  Due to  this lack of raising off the wall and the size and location of the  lesion, multiple biopsies were taken with Jumbo forceps.  Due to  difficulty staying in the cecum during the procedure the ileocecal valve  was not intubated.  On careful withdrawal the colonoscope revealed a 8-  mm sessile polyp in the descending colon that was removed with snare  cautery.  Retroflexion was done which revealed small internal  hemorrhoids.   ASSESSMENT:  1. Polypoid lesion in the cecum adjacent to the ileocecal valve,      status post multiple biopsies.  2. Descending colon polyp status post snare cautery snare cautery.  3. Small internal hemorrhoids.   PLAN:  1. Follow-up on  path.  2. The patient may need hemicolectomy if advanced adenoma noted on      pathology since the patient needs to be on chronic anticoagulation.  3. Restart heparin  4. Clear liquid diet and advance as tolerated.      Shirley Friar, MD  Electronically Signed     VCS/MEDQ  D:  01/27/2006  T:  01/28/2006  Job:  [id number]

## 2010-06-07 NOTE — H&P (Signed)
[name], [name]                ACCOUNT NO.:  [id number]   MEDICAL RECORD NO.:  [id number]          PATIENT TYPE:  INP   LOCATION:  6734                         FACILITY:  [facility]   PHYSICIAN:  Shirley Friar, MDDATE OF BIRTH:  April 10, 1935   DATE OF ADMISSION:  01/26/2006  DATE OF DISCHARGE:                              HISTORY & PHYSICAL   HISTORY OF PRESENT ILLNESS:  [age]-year-old black female with  history of mitral valve repair, hypertension, atrial fibrillation, who  was found to have microcytic anemia on recent blood counts.  She last  had a colonoscopy in January2006 which showed adenomatous polyps  including a biopsy of her cecum showing a fragment of tubular adenoma.  Based on the high likelihood of needing a polypectomy from this last  colonoscopy, I felt it necessary to have the patient admitted electively  to the hospital for bridging with heparin off of Coumadin.  She has had  occasional bright red blood on the toilet tissue, describing each  episode as a small amount.  She also reports a 10-pound weight loss the  past 6 months.  She moves her bowels about once every 3 days, which is  her normal pattern.   PAST MEDICAL HISTORY:  1. Status post mitral valve repair.  2. Hypertension.  3. Atrial fibrillation.  4. History of asthma.   MEDICINES:  1. Coumadin (held starting on January 22 2006)  2. Metoprolol 100 mg b.i.d.  3. Amiodarone 200 mg daily.  4. Amlodipine 5 mg daily.  5. Vesicare 10 mg daily.  6. Protonix 40 mg daily.  7. Caltrate 600 mg b.i.d.   ALLERGIES:  NO KNOWN DRUG ALLERGIES.   SOCIAL HISTORY:  Negative.  Denies cigarettes, alcohol or drugs.   FAMILY HISTORY:  Negative for colon cancer.   PAST SURGICAL HISTORY:  Status post cholecystectomy.   REVIEW OF SYSTEMS:  Negative, except as stated above.   PHYSICAL EXAM:  VITAL SIGNS:  Temperature 98, pulse 85, blood pressure  155/94, O2 SATs 95% on room air.  Weight 165 pounds.  GENERAL:   Alert, in no acute distress.  CHEST:  Clear to auscultation bilaterally.  CARDIOVASCULAR:  Regular rate and rhythm.  ABDOMEN:  Soft, nontender and nondistended.  Active bowel sounds.  EXTREMITIES:  No edema.   IMPRESSION:  [age]-year-old black female admitted electively for  colonoscopy, on heparin.  Her Coumadin has been held since January 22, 2006 when her INR was found to be 4.16 with her target being 2.5 to 3.5.  Hopefully, her INR from today will be low enough so that she can undergo  colonoscopy on January 27, 2006.  In the meantime, she will be on heparin  per pharmacy protocol and on a clear liquid diet.  She does not think  that she could drink NuLytely, so we will plan a prep with MiraLax and  Gatorade pending her INR result.  Prior to her colonoscopy, she will  receive prophylactic antibiotics likely in the form of ampicillin and  gentamicin, unless she has evidence of renal insufficiency.      Vincent C.  Bosie Clos, MD  Electronically Signed     VCS/MEDQ  D:  01/26/2006  T:  01/27/2006  Job:  [id number]

## 2010-06-07 NOTE — H&P (Signed)
[name], [name] NO.:  [id number]   MEDICAL RECORD NO.:  [id number]          PATIENT TYPE:  INP   LOCATION:  0104                         FACILITY:  [facility]   PHYSICIAN:  Deirdre Peer. Polite, M.D. DATE OF BIRTH:  1935/05/06   DATE OF ADMISSION:  06/01/2005  DATE OF DISCHARGE:                                HISTORY & PHYSICAL   CHIEF COMPLAINT:  Nausea, vomiting, abdominal pain, diarrhea.   HISTORY OF PRESENT ILLNESS:  A 75 year old female with known history of  atrial fibrillation, hypertension, status post mitral valve repair, who  presents to the ED with above Chief Complaint.  According to the patient,  she has not been feeling well for at least a week, started having abdominal  pain and diarrhea over the last 3 days.  The patient had some emesis today.  The patient denied any blood.  The patient denies any sick contacts, denies  any recent antibiotics. The patient does admit to vague abdominal discomfort  as well as early satiety. She also described a 10-pound weight loss.  The  patient was brought to the hospital by EMS after her daughter tried to reach  her by several attempts via phone.  In the ED, the patient was evaluated and  found to have a temperature of 100.4, blood pressure 137/76, pulse 106,  respiratory rate 20, O2 saturation 100%.  The patient had screening labs.  CBC: White count 9.6, hemoglobin 9.1 with neutrophil count of 86%.  INR 2.3.  BMET significant for creatinine of 1.6.  AST and ALT slightly elevated at 55  and 45, respectively, and bilirubin minimally elevated at 1.3 and a normal  lipase. The patient further had a CT that was suspicious for pancreatic head  cancer with metastatic adenopathy and liver metastasis.  [facility]  were called for further evaluation and management.   At the time of my evaluation, the patient is alert and oriented x3 with  complaints of vague abdominal discomfort and describes events as stated  above.   Admission is deemed necessary for further evaluation and treatment.   PAST MEDICAL HISTORY:  Significant for:  1.  Atrial fibrillation.  2.  Hypertension.  3.  Status post mitral value repair in 1987.  4.  The patient also admits to history of asthma.   MEDICATIONS ON ADMISSION:  1.  Amiodarone 200 mg daily.  2.  Amlodipine 5 mg daily.  3.  Metoprolol 100 mg twice daily.  4.  Protonix 40 mg daily.  5.  Warfarin 5 mg daily.   SOCIAL HISTORY:  Negative for tobacco, alcohol, or drugs.   ALLERGIES:  No known drug allergies.   PAST SURGICAL HISTORY:  Significant for cholecystectomy in the past, colonic  polyps removed.  Last colonoscopy 1-1/2 years ago with normal pathology.   FAMILY HISTORY:  Mother deceased secondary to CVA.  Father deceased  secondary to cancer of the prostate.   REVIEW OF SYSTEMS:  As stated above. In addition, 10-pound weight loss.  Denies fever or chills. Positive for emesis x1, positive for diarrhea.  Denies any dysuria.  The patient does admit to early satiety and just  general malaise.   PHYSICAL EXAMINATION:  GENERAL: The patient is alert and oriented x3.  VITAL SIGNS:  Temperature 100.3, blood pressure 137/76, pulse 106,  respiratory rate 20, O2 saturation 100%.  HEENT:  Significant for pale sclerae, moist oral mucosa.  NECK:  No nodes, no JVD.  CHEST: Clear to auscultation bilaterally.  CARDIOVASCULAR:  Irregularly irregular.  ABDOMEN: Soft, positive bowel sounds, no mass appreciated.  EXTREMITIES:  No clubbing, cyanosis, or edema.  RECTAL:  Exam deferred.  NEUROLOGIC: Exam nonfocal.   DATA:  As stated in the HPI.   ASSESSMENT:  1.  Nausea and vomiting.  2.  Abdominal pain.  3.  Abnormal CT suspicious for pancreatic head cancer with metastatic      adenopathy and liver metastasis.  4.  Atrial fibrillation on Coumadin.  5.  Hypertension.  6.  Weight loss.  7.  Fever.   RECOMMENDATIONS:  1.  Patient be admitted to telemetry floor bed.  2.   The patient will be started on IV fluids.  3.  We will provide her with analgesia.  4.  The patient will be pan cultured and started on empiric antibiotics.  5.  Her Coumadin will be held and will be started on IV heparin. INR is less      than 2.  6.  Recommend interventional radiology consult for potential biopsy of      abnormal lesions noted on CT scan.  7.  We will make further recommendations after review of above studies.      Deirdre Peer. Polite, M.D.  Electronically Signed     RDP/MEDQ  D:  06/01/2005  T:  06/01/2005  Job:  [id number]   cc:   Stacie Acres. Cliffton Asters, M.D.  Fax: [phone]

## 2010-06-07 NOTE — Consult Note (Signed)
[name], SNEED NO.:  [id number]   MEDICAL RECORD NO.:  [id number]          PATIENT TYPE:  INP   LOCATION:  6713                         FACILITY:  [facility]   PHYSICIAN:  [name], MDDATE OF BIRTH:  1935/04/03   DATE OF CONSULTATION:  02/07/2006  DATE OF DISCHARGE:                                 CONSULTATION   CARDIOLOGISTS:  1. [name]. [name], M.D.  2. [name]. Tami Som, MD, [facility].   CONSULTING PHYSICIAN:  [name], M.D.   REASON FOR CONSULTATION:  Atrial fibrillation with a rapid ventricular  response and GI bleed in the setting of a mechanical mitral valve.   HISTORY OF PRESENT ILLNESS:  [name] is a delightful 75 year old  woman with a history of nonischemic cardiomyopathy with a recovered  ejection fraction, chronic atrial fibrillation, and rheumatic heart  disease status post a St. Jude mitral valve replacement in 1987.  She  was admitted on January 26, 2006, for a heparin bridge in anticipation of  her colonoscopy.  She was found to have significant tubulovillous  adenomas and underwent right colectomy on February 03, 2006.  Her  hospital course has now been complicated by some epistaxis as well as  some bright red blood per rectum and melena.  Today she developed atrial  fibrillation with rapid ventricular response with heart rates up to 170.  She denies chest pain, but she does feel weak and mildly lightheaded.  Her heparin was stopped this morning due to her bleeding.   REVIEW OF SYSTEMS:  She denies any fevers or chills.  She does have some  nausea and early satiety.  Denies shortness of breath.  No orthopnea and  no PND.  Does have abdominal pain.  Otherwise review of systems is  negative except for HPI and problem list.   PROBLEM LIST:  1. History of nonischemic cardiomyopathy.      a.     EF 56% based on Cardiolite study in 2005.      b.     Echocardiogram, June 2006, showed an EF of 55-65% with a       stable  mitral valve prosthesis.  2. Chronic atrial fibrillation, maintained on Coumadin.  3. Rheumatic heart disease, status post St. Jude mitral valve      replacement in 1987.  4. Reported history of cirrhosis.  5. Hypertension.  6. History of syncope.  7. Anemia.  8. Gastroesophageal reflux disease.  There is no history of diabetes      or previous stroke.   SOCIAL HISTORY:  Denies any alcohol, tobacco, or drug use.  She does  have a remote history of tobacco and alcohol use but none currently.  She is retired.   FAMILY HISTORY:  Her mother had a stroke and hypertension.  Her father  had prostate cancer.  She had a sister with bladder cancer.   CURRENT MEDICATIONS:  Protonix IV, Reglan, Compazine, labetalol p.r.n.   ALLERGIES:  NO KNOWN DRUG ALLERGIES.   PHYSICAL EXAMINATION:  GENERAL:  She is lying flat in bed.  She is  moderately  ill-appearing and weak.  VITAL SIGNS:  She is afebrile at 98.3, blood pressure is 119/56, her  heart rate is 145.  HEENT:  Sclerae are anicteric.  EOMI.  There is no xanthelasma.  Mucous  membranes are dry.  Oropharynx is clear.  NECK:  Supple.  JVP does not appear markedly elevated.  Carotids are 1+  bilaterally without any obvious bruits.  CARDIAC:  She is tachycardic and irregular with an S4.  Question a soft  mitral murmur.  There is a mechanical S2.  LUNGS:  Clear.  ABDOMEN:  Soft.  Her midline dressing is clean and dry.  She does have  significant right lower quadrant tenderness with some voluntary  guarding.  There is no rebound.  Hypoactive bowel sounds.  EXTREMITIES:  Warm with no cyanosis, clubbing, or edema.  Distal pulses  are 1+ bilaterally.  There are no rashes.  NEUROLOGIC:  She is alert and oriented x3.  Cranial nerves II-XII are  intact.  She moves all extremities without difficulty.   Heparin level was 0.64.  White count is 11.6, hemoglobin is 8.6, down  from 10, platelets are 269.  INR is 1.1.  Telemetry shows atrial  fibrillation  with rapid ventricular response with a rate of 140-170.   ASSESSMENT:  1. Chronic atrial fibrillation now in rapid ventricular response.  2. Rheumatic heart disease, status post St. Jude mitral valve      replacement in 1987.  3. Postoperative day #4, status post colectomy.  4. Anemia with ongoing epistaxis and lower gastrointestinal bleeding.   PLAN/DISCUSSION:  We will treat her atrial fibrillation with digoxin and  IV Cardizem for better rate control.  My question is really what is  driving her tachycardic as her AFib has been chronic with relatively  good rate control.  This may be secondary to pain or anemia but her  tachycardia seems out of proportion to this.  Her belly does seem very  tender and I have discussed the case with Dr. Janee Morn in surgery who  will re-evaluate her abdominal situation.  Given her lack of stroke  history, probably it is safe to hold her heparin for 48-72 hours to let  her bleeding resolve without significant risk of valve thrombosis or  embolism.  We will follow with you closely.  We appreciate the consult.      Tami Buckles. Bensimhon, MD  Electronically Signed     DRB/MEDQ  D:  02/07/2006  T:  02/07/2006  Job:  [id number]

## 2010-06-07 NOTE — Discharge Summary (Signed)
NAME:  [name], [name]                          ACCOUNT NO.:  [id number]   MEDICAL RECORD NO.:  [id number]                   PATIENT TYPE:  INP   LOCATION:  6529                                 FACILITY:  [facility]   PHYSICIAN:  Olga Millers, M.D. [facility]            DATE OF BIRTH:  1935-02-08   DATE OF ADMISSION:  06/19/2003  DATE OF DISCHARGE:  06/21/2003                                 DISCHARGE SUMMARY   DISCHARGE DIAGNOSES:  1. Admitted with atypical chest pain, rule out myocardial infarction.  2. Exercise Cardiolite study on June 21, 2003, negative for ischemia,     ejection fraction 56%.   SECONDARY DIAGNOSES:  1. History of syncope.  2. Permanent atrial fibrillation on chronic Coumadin.  3. Status post mitral valve annuloplasty in 1987.  4. Hypertension.  5. Status post cholecystectomy.  6. Status post colon polyp resection.  7. Status post tubal ligation.  8. History of bronchospastic lung disease.  9. Allergic rhinitis.   PROCEDURE:  1. June 21, 2003, exercise Cardiolite study.  The patient exercised for a     total of 3 minutes 35 seconds in stage I Bruce, 1.0 METS.  Target heart     rate was 131, maximum heart rate achieved 172.  The exercise study was     stopped secondary to extreme dyspnea which had onset at 2 minutes 45     seconds into the study. The patient felt faint and had chest pressure     which was onset during the study and persisted into recovery.   DISPOSITION:  The patient is ready for discharge after a negative exercise  Cardiolite study on June 21, 2003.  She has had no further chest pain since  her admission at [facility] on May 30. She has been ruled out for  myocardial infarction.  Electrocardiogram shows atrial fibrillation with  right bundle branch block and no evidence of myocardial ischemia.   DISCHARGE MEDICATIONS:  1. Coumadin 5 mg daily except for Tuesday and Thursday when she takes 2.5     mg.  2. Amiodarone 200 mg a day.  3.  Lopressor 75 mg daily.  4. Procardia XL 30 mg daily.  5. Cozaar 25 mg daily.  6. Ibuprofen 400 mg four times daily.   ACTIVITY:  No restrictions.   DIET:  Low sodium and low cholesterol diet.   WOUND CARE:  Not applicable.   FOLLOW UP:  She has a follow-up visit with Dr. [name] on July 12, at 4:15  p.m. and Dr. Lubertha Basque office will call her to arrange for picking up a  Holter monitor to wear for 48 hours to make sure that she is not having  tachyarrhythmias with activities of daily living.   HISTORY OF PRESENT ILLNESS:  The patient is a 75 year old female.  She has a  past medical history of mitral valve annuloplasty.  She has permanent atrial  fibrillation on chronic Coumadin.  She also has hypertension.  There is no  history of coronary artery disease on her old charts.  She has been followed  by Dr. [name] for a history of syncope.  She typically does not have  exertional chest pain, although, she does have some dyspnea on exertion and  occasional pedal edema by her report as well as orthopnea.  On May 30, she  developed left-sided chest pain which presented under the left breast.  It  was intermittent with duration lasted approximately 1 minute.  It was  described as a sharp sensation associated with shortness of breath, but no  nausea, vomiting, or diaphoresis.  The pain increases with certain  movements, but is not pleuritic or related to food.  Because of this, she  came to the emergency room and we are asked to further evaluate.  She is on  amiodarone, Coumadin, Cozaar, and Lopressor as well as Procardia.   PLAN:  The patient will be admitted to telemetry.  This is atypical pain for  ischemia and the electrocardiogram shows no ST changes.  It is most  consistent with a musculoskeletal pain.  Cardiac enzymes will be cycled and  if they are negative, she would probably have a Cardiolite study. This will  be done on June 1.  She will follow up with Dr. [name] at discharge.    HOSPITAL COURSE:  After admission through the emergency room on May 30 with  complaint of chest pain, the patient was ruled out for myocardial  infarction.  Her electrocardiogram showed atrial fibrillation with right  bundle branch block, but no ischemic changes. The patient was stable  throughout her hospitalization.  Her admission complete blood count; white  count 7.3, hemoglobin 12.2, hematocrit 37.5, and platelets 240.  Serum  electrolytes on admission; sodium 136, potassium 4.3, chloride 107,  bicarbonate 19, glucose 100, BUN 20, and creatinine 1.4.  The patient has  been afebrile and has maintained her atrial fibrillation with fairly  controlled rates.  During exercise, of course her heart rate increased  dramatically. Her maximum heart rate was 172 and the patient was relatively  dyspneic.  The patient has recovered nicely and at the time of discharge is  calm, relaxed, not complaining of any chest pain, and is ready for  discharge.  She does home with the medications and follow-up as dictated  above.      Maple Mirza, P.A.                    Olga Millers, M.D. [facility]    GM/MEDQ  D:  06/21/2003  T:  06/22/2003  Job:  [id number]   cc:   Doylene Canning. [name], M.D.

## 2010-06-28 ENCOUNTER — Ambulatory Visit (INDEPENDENT_AMBULATORY_CARE_PROVIDER_SITE_OTHER): Payer: PRIVATE HEALTH INSURANCE | Admitting: *Deleted

## 2010-06-28 DIAGNOSIS — I4891 Unspecified atrial fibrillation: Secondary | ICD-10-CM

## 2010-06-28 LAB — POCT INR: INR: 4

## 2010-07-15 ENCOUNTER — Other Ambulatory Visit: Payer: Self-pay | Admitting: Internal Medicine

## 2010-07-26 ENCOUNTER — Ambulatory Visit (INDEPENDENT_AMBULATORY_CARE_PROVIDER_SITE_OTHER): Payer: PRIVATE HEALTH INSURANCE | Admitting: *Deleted

## 2010-07-26 DIAGNOSIS — I4891 Unspecified atrial fibrillation: Secondary | ICD-10-CM

## 2010-07-26 LAB — POCT INR: INR: 3.2

## 2010-07-31 ENCOUNTER — Other Ambulatory Visit: Payer: Self-pay | Admitting: Internal Medicine

## 2010-08-14 ENCOUNTER — Other Ambulatory Visit: Payer: Self-pay | Admitting: Cardiology

## 2010-08-15 ENCOUNTER — Other Ambulatory Visit: Payer: Self-pay | Admitting: Internal Medicine

## 2010-08-22 ENCOUNTER — Ambulatory Visit (INDEPENDENT_AMBULATORY_CARE_PROVIDER_SITE_OTHER): Payer: PRIVATE HEALTH INSURANCE | Admitting: *Deleted

## 2010-08-22 DIAGNOSIS — I4891 Unspecified atrial fibrillation: Secondary | ICD-10-CM

## 2010-08-22 LAB — POCT INR: INR: 3.1

## 2010-08-27 ENCOUNTER — Other Ambulatory Visit: Payer: Self-pay | Admitting: Internal Medicine

## 2010-08-27 DIAGNOSIS — Z1231 Encounter for screening mammogram for malignant neoplasm of breast: Secondary | ICD-10-CM

## 2010-09-04 ENCOUNTER — Other Ambulatory Visit: Payer: Self-pay | Admitting: Cardiology

## 2010-09-05 NOTE — Telephone Encounter (Signed)
Coumadin refill request

## 2010-09-06 ENCOUNTER — Other Ambulatory Visit: Payer: Self-pay | Admitting: Internal Medicine

## 2010-09-06 MED ORDER — METOPROLOL TARTRATE 50 MG PO TABS: ORAL_TABLET | ORAL | Status: DC

## 2010-09-09 MED ORDER — WARFARIN SODIUM 5 MG PO TABS: ORAL_TABLET | ORAL | Status: DC

## 2010-09-20 ENCOUNTER — Ambulatory Visit (INDEPENDENT_AMBULATORY_CARE_PROVIDER_SITE_OTHER): Payer: PRIVATE HEALTH INSURANCE | Admitting: *Deleted

## 2010-09-20 DIAGNOSIS — I4891 Unspecified atrial fibrillation: Secondary | ICD-10-CM

## 2010-09-20 LAB — POCT INR: INR: 3

## 2010-10-01 ENCOUNTER — Ambulatory Visit: Payer: PRIVATE HEALTH INSURANCE

## 2010-10-10 LAB — PROTIME-INR
INR: 2.5 — ABNORMAL HIGH
Prothrombin Time: 27.8 — ABNORMAL HIGH

## 2010-10-18 ENCOUNTER — Ambulatory Visit (INDEPENDENT_AMBULATORY_CARE_PROVIDER_SITE_OTHER): Payer: PRIVATE HEALTH INSURANCE | Admitting: *Deleted

## 2010-10-18 DIAGNOSIS — I4891 Unspecified atrial fibrillation: Secondary | ICD-10-CM

## 2010-10-18 LAB — POCT INR: INR: 2.4

## 2010-10-24 ENCOUNTER — Encounter: Payer: Self-pay | Admitting: Internal Medicine

## 2010-10-25 ENCOUNTER — Ambulatory Visit: Payer: PRIVATE HEALTH INSURANCE | Admitting: Internal Medicine

## 2010-10-26 ENCOUNTER — Inpatient Hospital Stay (INDEPENDENT_AMBULATORY_CARE_PROVIDER_SITE_OTHER)
Admission: RE | Admit: 2010-10-26 | Discharge: 2010-10-26 | Disposition: A | Discharge status: Home / Self Care | Payer: PRIVATE HEALTH INSURANCE | Source: Ambulatory Visit | Attending: Emergency Medicine | Admitting: Emergency Medicine

## 2010-10-26 ENCOUNTER — Emergency Department (HOSPITAL_COMMUNITY)
Admission: EM | Admit: 2010-10-26 | Discharge: 2010-10-26 | Disposition: A | Discharge status: Home / Self Care | Payer: PRIVATE HEALTH INSURANCE | Attending: Emergency Medicine | Admitting: Emergency Medicine

## 2010-10-26 ENCOUNTER — Emergency Department (HOSPITAL_COMMUNITY): Payer: PRIVATE HEALTH INSURANCE

## 2010-10-26 ENCOUNTER — Encounter (HOSPITAL_COMMUNITY): Payer: Self-pay | Admitting: Radiology

## 2010-10-26 DIAGNOSIS — R51 Headache: Secondary | ICD-10-CM | POA: Insufficient documentation

## 2010-10-26 DIAGNOSIS — R42 Dizziness and giddiness: Secondary | ICD-10-CM

## 2010-10-26 DIAGNOSIS — I10 Essential (primary) hypertension: Secondary | ICD-10-CM | POA: Insufficient documentation

## 2010-10-26 DIAGNOSIS — W010XXA Fall on same level from slipping, tripping and stumbling without subsequent striking against object, initial encounter: Secondary | ICD-10-CM | POA: Insufficient documentation

## 2010-10-26 DIAGNOSIS — S0990XA Unspecified injury of head, initial encounter: Secondary | ICD-10-CM | POA: Insufficient documentation

## 2010-10-26 LAB — PROTIME-INR
INR: 2.03 — ABNORMAL HIGH (ref 0.00–1.49)
Prothrombin Time: 23.3 seconds — ABNORMAL HIGH (ref 11.6–15.2)

## 2010-10-26 LAB — SAMPLE TO BLOOD BANK

## 2010-10-28 ENCOUNTER — Ambulatory Visit: Payer: PRIVATE HEALTH INSURANCE

## 2010-10-28 ENCOUNTER — Telehealth: Payer: Self-pay | Admitting: Pharmacist

## 2010-10-28 NOTE — Telephone Encounter (Signed)
Pt went to ER this weekend after falling and hitting her head.  Everything checked out okay.  Her INR was slightly subtherapeutic at 2.0.  She reports missing a dose last week.  ER had her take 2 baby aspirins over weekend.  Instructed her to take 2 tablets today, 1 1/2 tablets tomorrow then resume normal dose.  She will keep her original follow-up appt on 10/26.

## 2010-11-12 ENCOUNTER — Ambulatory Visit: Payer: PRIVATE HEALTH INSURANCE

## 2010-11-15 ENCOUNTER — Encounter: Payer: PRIVATE HEALTH INSURANCE | Admitting: *Deleted

## 2010-11-20 ENCOUNTER — Ambulatory Visit (INDEPENDENT_AMBULATORY_CARE_PROVIDER_SITE_OTHER): Payer: PRIVATE HEALTH INSURANCE | Admitting: *Deleted

## 2010-11-20 DIAGNOSIS — Z7901 Long term (current) use of anticoagulants: Secondary | ICD-10-CM

## 2010-11-20 DIAGNOSIS — I4891 Unspecified atrial fibrillation: Secondary | ICD-10-CM

## 2010-11-20 DIAGNOSIS — Z954 Presence of other heart-valve replacement: Secondary | ICD-10-CM

## 2010-11-20 LAB — POCT INR: INR: 2.4

## 2010-12-04 ENCOUNTER — Ambulatory Visit (INDEPENDENT_AMBULATORY_CARE_PROVIDER_SITE_OTHER): Payer: PRIVATE HEALTH INSURANCE | Admitting: *Deleted

## 2010-12-04 DIAGNOSIS — I4891 Unspecified atrial fibrillation: Secondary | ICD-10-CM

## 2010-12-04 DIAGNOSIS — Z954 Presence of other heart-valve replacement: Secondary | ICD-10-CM

## 2010-12-04 DIAGNOSIS — Z7901 Long term (current) use of anticoagulants: Secondary | ICD-10-CM

## 2010-12-04 LAB — POCT INR: INR: 3.6

## 2010-12-17 ENCOUNTER — Ambulatory Visit
Admission: RE | Admit: 2010-12-17 | Discharge: 2010-12-17 | Disposition: A | Discharge status: Home / Self Care | Payer: PRIVATE HEALTH INSURANCE | Source: Ambulatory Visit | Attending: Internal Medicine | Admitting: Internal Medicine

## 2010-12-17 DIAGNOSIS — Z1231 Encounter for screening mammogram for malignant neoplasm of breast: Secondary | ICD-10-CM

## 2010-12-19 ENCOUNTER — Ambulatory Visit: Payer: PRIVATE HEALTH INSURANCE | Admitting: Internal Medicine

## 2010-12-19 ENCOUNTER — Encounter: Payer: PRIVATE HEALTH INSURANCE | Admitting: *Deleted

## 2010-12-24 ENCOUNTER — Ambulatory Visit (INDEPENDENT_AMBULATORY_CARE_PROVIDER_SITE_OTHER): Payer: PRIVATE HEALTH INSURANCE | Admitting: *Deleted

## 2010-12-24 DIAGNOSIS — Z954 Presence of other heart-valve replacement: Secondary | ICD-10-CM

## 2010-12-24 DIAGNOSIS — I4891 Unspecified atrial fibrillation: Secondary | ICD-10-CM

## 2010-12-24 DIAGNOSIS — Z7901 Long term (current) use of anticoagulants: Secondary | ICD-10-CM

## 2010-12-24 LAB — POCT INR: INR: 3.2

## 2010-12-24 MED ORDER — WARFARIN SODIUM 5 MG PO TABS: ORAL_TABLET | ORAL | Status: DC

## 2011-01-23 ENCOUNTER — Encounter: Payer: PRIVATE HEALTH INSURANCE | Admitting: *Deleted

## 2011-01-27 ENCOUNTER — Ambulatory Visit (INDEPENDENT_AMBULATORY_CARE_PROVIDER_SITE_OTHER): Payer: PRIVATE HEALTH INSURANCE | Admitting: *Deleted

## 2011-01-27 DIAGNOSIS — I4891 Unspecified atrial fibrillation: Secondary | ICD-10-CM

## 2011-01-27 DIAGNOSIS — Z7901 Long term (current) use of anticoagulants: Secondary | ICD-10-CM

## 2011-01-27 DIAGNOSIS — Z954 Presence of other heart-valve replacement: Secondary | ICD-10-CM

## 2011-01-27 LAB — POCT INR: INR: 3.5

## 2011-02-04 ENCOUNTER — Ambulatory Visit: Payer: PRIVATE HEALTH INSURANCE | Admitting: Internal Medicine

## 2011-02-20 ENCOUNTER — Other Ambulatory Visit: Payer: Self-pay | Admitting: Cardiology

## 2011-02-24 ENCOUNTER — Ambulatory Visit (INDEPENDENT_AMBULATORY_CARE_PROVIDER_SITE_OTHER): Payer: PRIVATE HEALTH INSURANCE | Admitting: *Deleted

## 2011-02-24 ENCOUNTER — Other Ambulatory Visit: Payer: Self-pay

## 2011-02-24 DIAGNOSIS — Z7901 Long term (current) use of anticoagulants: Secondary | ICD-10-CM

## 2011-02-24 DIAGNOSIS — I4891 Unspecified atrial fibrillation: Secondary | ICD-10-CM

## 2011-02-24 DIAGNOSIS — Z954 Presence of other heart-valve replacement: Secondary | ICD-10-CM

## 2011-02-24 LAB — POCT INR: INR: 3.9

## 2011-02-24 MED ORDER — METOPROLOL TARTRATE 50 MG PO TABS: ORAL_TABLET | ORAL | Status: DC

## 2011-02-28 ENCOUNTER — Other Ambulatory Visit: Payer: Self-pay | Admitting: Cardiology

## 2011-03-20 ENCOUNTER — Ambulatory Visit (INDEPENDENT_AMBULATORY_CARE_PROVIDER_SITE_OTHER): Payer: PRIVATE HEALTH INSURANCE | Admitting: *Deleted

## 2011-03-20 DIAGNOSIS — I4891 Unspecified atrial fibrillation: Secondary | ICD-10-CM

## 2011-03-20 DIAGNOSIS — Z954 Presence of other heart-valve replacement: Secondary | ICD-10-CM

## 2011-03-20 DIAGNOSIS — Z7901 Long term (current) use of anticoagulants: Secondary | ICD-10-CM

## 2011-03-20 LAB — POCT INR: INR: 3

## 2011-03-28 ENCOUNTER — Other Ambulatory Visit: Payer: Self-pay | Admitting: Internal Medicine

## 2011-04-10 ENCOUNTER — Ambulatory Visit (INDEPENDENT_AMBULATORY_CARE_PROVIDER_SITE_OTHER): Payer: PRIVATE HEALTH INSURANCE | Admitting: *Deleted

## 2011-04-10 DIAGNOSIS — I4891 Unspecified atrial fibrillation: Secondary | ICD-10-CM

## 2011-04-10 DIAGNOSIS — Z954 Presence of other heart-valve replacement: Secondary | ICD-10-CM

## 2011-04-10 DIAGNOSIS — Z7901 Long term (current) use of anticoagulants: Secondary | ICD-10-CM

## 2011-04-10 LAB — POCT INR: INR: 3.8

## 2011-04-15 ENCOUNTER — Ambulatory Visit: Payer: PRIVATE HEALTH INSURANCE | Admitting: Internal Medicine

## 2011-05-01 ENCOUNTER — Telehealth: Payer: Self-pay | Admitting: Pharmacist

## 2011-05-01 NOTE — Telephone Encounter (Signed)
Patient called to inform us that she is starting Ciprofloxacin 250mg  BID x5 days and is concerned that it may affect her INR.  Her INR check was rescheduled for Thurs 4/18 at her request, and she was informed to call with any signs of bleeding.

## 2011-05-08 ENCOUNTER — Ambulatory Visit (INDEPENDENT_AMBULATORY_CARE_PROVIDER_SITE_OTHER): Payer: PRIVATE HEALTH INSURANCE | Admitting: Pharmacist

## 2011-05-08 DIAGNOSIS — Z7901 Long term (current) use of anticoagulants: Secondary | ICD-10-CM

## 2011-05-08 DIAGNOSIS — I4891 Unspecified atrial fibrillation: Secondary | ICD-10-CM

## 2011-05-08 DIAGNOSIS — Z954 Presence of other heart-valve replacement: Secondary | ICD-10-CM

## 2011-05-08 LAB — POCT INR: INR: 3.7

## 2011-05-15 ENCOUNTER — Ambulatory Visit (INDEPENDENT_AMBULATORY_CARE_PROVIDER_SITE_OTHER): Payer: PRIVATE HEALTH INSURANCE | Admitting: Internal Medicine

## 2011-05-15 ENCOUNTER — Encounter: Payer: Self-pay | Admitting: Internal Medicine

## 2011-05-15 VITALS — BP 140/98 | HR 71 | Ht 67.5 in | Wt 180.0 lb

## 2011-05-15 DIAGNOSIS — I4891 Unspecified atrial fibrillation: Secondary | ICD-10-CM

## 2011-05-15 DIAGNOSIS — Z954 Presence of other heart-valve replacement: Secondary | ICD-10-CM

## 2011-05-15 DIAGNOSIS — I059 Rheumatic mitral valve disease, unspecified: Secondary | ICD-10-CM

## 2011-05-15 DIAGNOSIS — I10 Essential (primary) hypertension: Secondary | ICD-10-CM

## 2011-05-15 NOTE — Assessment & Plan Note (Signed)
Her blood pressure today is slightly elevated. She does admit to dietary indiscretion with sodium. I've encouraged her to reduce her sodium intake. She will continue her current blood pressure medications.

## 2011-05-15 NOTE — Assessment & Plan Note (Signed)
Her ventricular rate appears to be well-controlled. She will continue her current medical therapy. 

## 2011-05-15 NOTE — Patient Instructions (Signed)
Your physician wants you to follow-up in: 1 year with Dr. Taylor. You will receive a reminder letter in the mail two months in advance. If you don't receive a letter, please call our office to schedule the follow-up appointment.  

## 2011-05-15 NOTE — Progress Notes (Signed)
HPI [name] returns today for followup. She is a very pleasant 76 year old woman with a history of mitral valve disease status post mitral valve replacement, chronic atrial fibrillation, hypertension, nonsustained ventricular tachycardia, also with a history of epistaxis.in the interim, she has done well. She is walking regularly, and exercising. She admits to sodium indiscretion however. No syncope. No peripheral edema. Allergies  Allergen Reactions  . Esomeprazole Magnesium     REACTION: stomach upset, nausea     Current Outpatient Prescriptions  Medication Sig Dispense Refill  . amoxicillin (AMOXIL) 500 MG capsule TAKE 4 CAPSULES BY MOUTH 1 HOUR PRIOR TO DENTAL WORK  4 capsule  0  . Calcium Carbonate-Vitamin D (CALCIUM + D PO) Take by mouth daily.      Marland Kitchen CARTIA XT 180 MG 24 hr capsule TAKE 1 CAPSULE BY MOUTH EVERY DAY  90 capsule  11  . digoxin (LANOXIN) 0.125 MG tablet TAKE 1 TABLET BY MOUTH ONCE A DAY  90 tablet  4  . hydrochlorothiazide (MICROZIDE) 12.5 MG capsule Take 12.5 mg by mouth daily.      . metoprolol (LOPRESSOR) 50 MG tablet 1 1/2 tablet twice daily  45 tablet  1  . metoprolol (LOPRESSOR) 50 MG tablet TAKE 1 AND 1/2 TABLETS BY MOUTH TWICE DAILY  180 tablet  0  . Omeprazole (PRILOSEC PO) Take by mouth daily.      Marland Kitchen warfarin (COUMADIN) 5 MG tablet Take as directed by Anticoagulation clinic  30 tablet  3     Past Medical History  Diagnosis Date  . Asthma   . Hypertension   . GI bleed   . Arrhythmia     chronic afib NSVT  . RBBB (right bundle branch block)   . Chronic atrial fibrillation   . Dilated cardiomyopathy     history of this, now resolved  . GERD (gastroesophageal reflux disease)   . Dysphagia     Hx  . Nonsustained ventricular tachycardia   . Osteoporosis   . Microcytic anemia     ROS:   All systems reviewed and negative except as noted in the HPI.   Past Surgical History  Procedure Date  . Cholecystectomy   . Mitral valve prosthesis with  angioplasty 1987  . Tubal ligation   . Transthoracic echocardiogram 02/2008, 03/2006, 06/2004  . Polyp removal      Family History  Problem Relation Age of Onset  . Hypertension Mother   . Colon cancer Neg Hx   . Cancer Sister     in the Bladder     History   Social History  . Marital Status: Divorced    Spouse Name: N/A    Number of Children: N/A  . Years of Education: N/A   Occupational History  . Not on file.   Social History Main Topics  . Smoking status: Never Smoker   . Smokeless tobacco: Not on file  . Alcohol Use: No  . Drug Use: No  . Sexually Active: Not on file   Other Topics Concern  . Not on file   Social History Narrative  . No narrative on file     There were no vitals taken for this visit.  Physical Exam:  Well appearing 75 year old woman, NAD HEENT: Unremarkable Neck:  No JVD, no thyromegally Lungs:  Clear with no wheezes, rales, or rhonchi. HEART:  IRegular rate rhythm, 2/6 systolic murmur, no rubs, no clicks. Mechanical S1 closure. Abd:  soft, positive bowel sounds, no organomegally, no rebound,  no guarding Ext:  2 plus pulses, no edema, no cyanosis, no clubbing Skin:  No rashes no nodules Neuro:  CN II through XII intact, motor grossly intact  EKG Atrial fibrillation with a controlled ventricular response and right bundle branch block.  Assess/Plan:

## 2011-05-15 NOTE — Assessment & Plan Note (Signed)
Her valve sounds are crisp. She will continue her anticoagulation with Coumadin.

## 2011-05-24 ENCOUNTER — Other Ambulatory Visit: Payer: Self-pay | Admitting: Internal Medicine

## 2011-05-29 ENCOUNTER — Ambulatory Visit (INDEPENDENT_AMBULATORY_CARE_PROVIDER_SITE_OTHER): Payer: PRIVATE HEALTH INSURANCE | Admitting: *Deleted

## 2011-05-29 DIAGNOSIS — I4891 Unspecified atrial fibrillation: Secondary | ICD-10-CM

## 2011-05-29 DIAGNOSIS — Z7901 Long term (current) use of anticoagulants: Secondary | ICD-10-CM

## 2011-05-29 DIAGNOSIS — Z954 Presence of other heart-valve replacement: Secondary | ICD-10-CM

## 2011-05-29 LAB — POCT INR: INR: 2.9

## 2011-06-03 ENCOUNTER — Other Ambulatory Visit: Payer: Self-pay | Admitting: Internal Medicine

## 2011-06-26 ENCOUNTER — Ambulatory Visit (INDEPENDENT_AMBULATORY_CARE_PROVIDER_SITE_OTHER): Payer: PRIVATE HEALTH INSURANCE | Admitting: *Deleted

## 2011-06-26 DIAGNOSIS — Z7901 Long term (current) use of anticoagulants: Secondary | ICD-10-CM

## 2011-06-26 DIAGNOSIS — Z954 Presence of other heart-valve replacement: Secondary | ICD-10-CM

## 2011-06-26 DIAGNOSIS — I4891 Unspecified atrial fibrillation: Secondary | ICD-10-CM

## 2011-06-26 LAB — POCT INR: INR: 3.8

## 2011-07-11 ENCOUNTER — Ambulatory Visit (INDEPENDENT_AMBULATORY_CARE_PROVIDER_SITE_OTHER): Payer: PRIVATE HEALTH INSURANCE | Admitting: Pharmacist

## 2011-07-11 DIAGNOSIS — I4891 Unspecified atrial fibrillation: Secondary | ICD-10-CM

## 2011-07-11 DIAGNOSIS — Z7901 Long term (current) use of anticoagulants: Secondary | ICD-10-CM

## 2011-07-11 DIAGNOSIS — Z954 Presence of other heart-valve replacement: Secondary | ICD-10-CM

## 2011-07-11 LAB — POCT INR: INR: 5.3

## 2011-07-21 ENCOUNTER — Telehealth: Payer: Self-pay | Admitting: *Deleted

## 2011-07-21 ENCOUNTER — Ambulatory Visit (INDEPENDENT_AMBULATORY_CARE_PROVIDER_SITE_OTHER): Payer: PRIVATE HEALTH INSURANCE | Admitting: *Deleted

## 2011-07-21 DIAGNOSIS — I4891 Unspecified atrial fibrillation: Secondary | ICD-10-CM

## 2011-07-21 DIAGNOSIS — Z7901 Long term (current) use of anticoagulants: Secondary | ICD-10-CM

## 2011-07-21 DIAGNOSIS — Z954 Presence of other heart-valve replacement: Secondary | ICD-10-CM

## 2011-07-21 LAB — POCT INR: INR: 3

## 2011-07-21 NOTE — Telephone Encounter (Signed)
Pt called stating she has been started on  Cephalexin 500 mg bid for 5 days for UTI  Instructed that this antibiotic will  not  affect INR

## 2011-08-08 ENCOUNTER — Ambulatory Visit (INDEPENDENT_AMBULATORY_CARE_PROVIDER_SITE_OTHER): Payer: PRIVATE HEALTH INSURANCE | Admitting: *Deleted

## 2011-08-08 DIAGNOSIS — Z954 Presence of other heart-valve replacement: Secondary | ICD-10-CM

## 2011-08-08 DIAGNOSIS — I4891 Unspecified atrial fibrillation: Secondary | ICD-10-CM

## 2011-08-08 DIAGNOSIS — Z7901 Long term (current) use of anticoagulants: Secondary | ICD-10-CM

## 2011-08-08 LAB — POCT INR: INR: 4.4

## 2011-08-13 ENCOUNTER — Encounter: Payer: Self-pay | Admitting: Internal Medicine

## 2011-08-14 ENCOUNTER — Telehealth: Payer: Self-pay | Admitting: Internal Medicine

## 2011-08-14 NOTE — Telephone Encounter (Signed)
Walk In pt Form " [employer]" paper Dropped off, needs to Be Completed Sent to [facility] 08/14/11/KM

## 2011-08-19 ENCOUNTER — Emergency Department (INDEPENDENT_AMBULATORY_CARE_PROVIDER_SITE_OTHER)
Admission: EM | Admit: 2011-08-19 | Discharge: 2011-08-19 | Disposition: A | Discharge status: Home / Self Care | Payer: PRIVATE HEALTH INSURANCE | Source: Home / Self Care | Attending: Emergency Medicine | Admitting: Emergency Medicine

## 2011-08-19 ENCOUNTER — Encounter (HOSPITAL_COMMUNITY): Payer: Self-pay | Admitting: *Deleted

## 2011-08-19 DIAGNOSIS — N390 Urinary tract infection, site not specified: Secondary | ICD-10-CM

## 2011-08-19 LAB — POCT URINALYSIS DIP (DEVICE)
Bilirubin Urine: NEGATIVE
Glucose, UA: NEGATIVE mg/dL
Ketones, ur: NEGATIVE mg/dL
Nitrite: POSITIVE — AB
Protein, ur: 100 mg/dL — AB
Specific Gravity, Urine: 1.01 (ref 1.005–1.030)
Urobilinogen, UA: 0.2 mg/dL (ref 0.0–1.0)
pH: 5 (ref 5.0–8.0)

## 2011-08-19 MED ORDER — CEPHALEXIN 500 MG PO CAPS: 500.0000 mg | ORAL_CAPSULE | Freq: Three times a day (TID) | ORAL | Status: DC

## 2011-08-19 MED ORDER — CEPHALEXIN 500 MG PO CAPS: 500.0000 mg | ORAL_CAPSULE | Freq: Three times a day (TID) | ORAL | Status: AC

## 2011-08-19 NOTE — ED Notes (Signed)
Pt  Reports  Symptoms  Of  Burning  And    Some  Discomfort  When  She  Urinates        The  Pt  Reports  She was  Treated  For          A   Little   Over   1  Month     Ago      And  Has  An  appt      In    Aug  With a  Specialist            She  Is  Awake  And  Alert  And  Oriented

## 2011-08-19 NOTE — ED Provider Notes (Signed)
History     CSN: [id number]  Arrival date & time 08/19/11  1411   First MD Initiated Contact with Patient 08/19/11 1423      Chief Complaint  Patient presents with  . Urinary Tract Infection    (Consider location/radiation/quality/duration/timing/severity/associated sxs/prior treatment) HPI Comments: Patient presents to urgent care this evening complaining of burning with urination and pressure. Describes that 2 days ago her symptoms started again in the last infection she had was about a month ago. She describes that she always feels pressure when she urinates but the burning is near. His urinating frequently with increased burning this morning. She denies any abdominal pain, fevers or vomiting. Daughter also describes it she has been scheduled to see a urologist as she continues to have this problem. They were unable to provide further information as to what antibiotics were  use were used last or of any urine culture results. when asked.  Patient feels otherwise at she's eating well no changes in her appetite and no abdominal pain or diarrheas  Patient is a 76 y.o. female presenting with urinary tract infection. The history is provided by the patient and a relative.  Urinary Tract Infection This is a recurrent problem. The current episode started 2 days ago. The problem occurs constantly. The problem has not changed since onset.Nothing relieves the symptoms. She has tried nothing for the symptoms.    Past Medical History  Diagnosis Date  . Asthma   . Hypertension   . GI bleed   . Arrhythmia     chronic afib NSVT  . RBBB (right bundle branch block)   . Chronic atrial fibrillation   . Dilated cardiomyopathy     history of this, now resolved  . GERD (gastroesophageal reflux disease)   . Dysphagia     Hx  . Nonsustained ventricular tachycardia   . Osteoporosis   . Microcytic anemia     Past Surgical History  Procedure Date  . Cholecystectomy   . Mitral valve prosthesis  with angioplasty 1987  . Tubal ligation   . Transthoracic echocardiogram 02/2008, 03/2006, 06/2004  . Polyp removal     Family History  Problem Relation Age of Onset  . Hypertension Mother   . Colon cancer Neg Hx   . Cancer Sister     in the Bladder    History  Substance Use Topics  . Smoking status: Never Smoker   . Smokeless tobacco: Not on file  . Alcohol Use: No    OB History    Grav Para Term Preterm Abortions TAB SAB Ect Mult Living                  Review of Systems  Constitutional: Negative for fever, chills, activity change, appetite change and fatigue.  Genitourinary: Positive for dysuria, urgency and frequency. Negative for hematuria, flank pain, vaginal bleeding, menstrual problem and pelvic pain.    Allergies  Esomeprazole magnesium  Home Medications   Current Outpatient Rx  Name Route Sig Dispense Refill  . AMOXICILLIN 500 MG PO CAPS  TAKE 4 CAPSULES BY MOUTH 1 HOUR PRIOR TO DENTAL WORK 4 capsule 0  . CALCIUM + D PO Oral Take by mouth daily.    Marland Kitchen CARTIA XT 180 MG PO CP24  TAKE 1 CAPSULE BY MOUTH EVERY DAY 90 capsule 11    **Patient requests 90 day supply**  . CEPHALEXIN 500 MG PO CAPS Oral Take 1 capsule (500 mg total) by mouth 3 (three) times daily. 21  capsule 0  . DIGOXIN 0.125 MG PO TABS  TAKE 1 TABLET BY MOUTH ONCE A DAY 90 tablet 4    **Patient requests 90 day supply**  . HYDROCHLOROTHIAZIDE 12.5 MG PO CAPS Oral Take 12.5 mg by mouth daily.    Marland Kitchen METOPROLOL TARTRATE 50 MG PO TABS  1 1/2 tablet twice daily 45 tablet 1    Patient needs to make appointment.  Marland Kitchen METOPROLOL TARTRATE 50 MG PO TABS  TAKE 1 AND 1/2 TABLETS BY MOUTH TWICE DAILY 180 tablet 3  . PRILOSEC PO Oral Take by mouth daily.    . WARFARIN SODIUM 5 MG PO TABS  Take as directed by Anticoagulation clinic 30 tablet 3    BP 118/95  Pulse 78  Temp 97.9 F (36.6 C) (Oral)  Resp 18  SpO2 96%  Physical Exam  Nursing note and vitals reviewed. Constitutional: Vital signs are normal. She  appears well-developed.  Non-toxic appearance. She does not have a sickly appearance. She does not appear ill. No distress.  Abdominal: Soft. She exhibits no distension and no mass. There is no tenderness. There is no rebound and no guarding.  Neurological: She is alert.    ED Course  Procedures (including critical care time)  Labs Reviewed  POCT URINALYSIS DIP (DEVICE) - Abnormal; Notable for the following:    Hgb urine dipstick SMALL (*)     Protein, ur 100 (*)     Nitrite POSITIVE (*)     Leukocytes, UA SMALL (*)  Biochemical Testing Only. Please order routine urinalysis from main lab if confirmatory testing is needed.   All other components within normal limits  URINE CULTURE   No results found.   1. Urinary tract infection       MDM   Patient presents urgent care this afternoon with a new onset of dysuria, although explains that she's undergone evaluation with a urologist which she will see next week. Patient has an abnormal urine, no symptoms suggestive of Pyelonephritis as patient is not febrile, no flank pain nausea or vomiting. Patient has been prescribed Keflex for 7 days and urine sample was sent for cultures. Patient has multiple comorbidities, daughter is accompanying patient has discuss clearly what symptoms should warrant further evaluation in the main emergency department. She agrees understands and acknowledges symptoms that should warrant further evaluation and they will followup with the urologist as planned. As she has been having recurrent urinary tract infections.       Jimmie Molly, MD 08/19/11 [phone]

## 2011-08-21 LAB — URINE CULTURE: Colony Count: >=100000

## 2011-08-21 NOTE — ED Notes (Signed)
Urine culture: >100,000 colonies Klebsiella Pneumoniae.  Pt. Adequately treated with Keflex. [name] M 08/21/2011  

## 2011-08-22 ENCOUNTER — Ambulatory Visit (INDEPENDENT_AMBULATORY_CARE_PROVIDER_SITE_OTHER): Payer: PRIVATE HEALTH INSURANCE

## 2011-08-22 DIAGNOSIS — Z954 Presence of other heart-valve replacement: Secondary | ICD-10-CM

## 2011-08-22 DIAGNOSIS — I4891 Unspecified atrial fibrillation: Secondary | ICD-10-CM

## 2011-08-22 DIAGNOSIS — Z7901 Long term (current) use of anticoagulants: Secondary | ICD-10-CM

## 2011-08-22 LAB — POCT INR: INR: 2.9

## 2011-09-02 NOTE — Telephone Encounter (Addendum)
DMV paper completed By Dr.Taylor faxed along with LOV To [phone] letting Pt Know Ready for Pick Up  09/02/11/KM

## 2011-09-02 NOTE — Telephone Encounter (Signed)
Pt called back, she asked that her DMV papers be Mailed Placed in Mail 09/02/11/KM

## 2011-09-12 ENCOUNTER — Other Ambulatory Visit: Payer: Self-pay | Admitting: Cardiology

## 2011-09-12 ENCOUNTER — Ambulatory Visit (INDEPENDENT_AMBULATORY_CARE_PROVIDER_SITE_OTHER): Payer: PRIVATE HEALTH INSURANCE | Admitting: *Deleted

## 2011-09-12 DIAGNOSIS — I4891 Unspecified atrial fibrillation: Secondary | ICD-10-CM

## 2011-09-12 DIAGNOSIS — Z954 Presence of other heart-valve replacement: Secondary | ICD-10-CM

## 2011-09-12 DIAGNOSIS — Z7901 Long term (current) use of anticoagulants: Secondary | ICD-10-CM

## 2011-09-12 LAB — POCT INR: INR: 2.9

## 2011-09-17 ENCOUNTER — Other Ambulatory Visit: Payer: Self-pay | Admitting: Internal Medicine

## 2011-10-10 ENCOUNTER — Ambulatory Visit (INDEPENDENT_AMBULATORY_CARE_PROVIDER_SITE_OTHER): Payer: PRIVATE HEALTH INSURANCE

## 2011-10-10 DIAGNOSIS — I4891 Unspecified atrial fibrillation: Secondary | ICD-10-CM

## 2011-10-10 DIAGNOSIS — Z7901 Long term (current) use of anticoagulants: Secondary | ICD-10-CM

## 2011-10-10 DIAGNOSIS — Z954 Presence of other heart-valve replacement: Secondary | ICD-10-CM

## 2011-10-10 LAB — POCT INR: INR: 3.6

## 2011-11-10 ENCOUNTER — Other Ambulatory Visit: Payer: Self-pay | Admitting: Internal Medicine

## 2011-11-10 DIAGNOSIS — Z1231 Encounter for screening mammogram for malignant neoplasm of breast: Secondary | ICD-10-CM

## 2011-11-11 ENCOUNTER — Ambulatory Visit (INDEPENDENT_AMBULATORY_CARE_PROVIDER_SITE_OTHER): Payer: PRIVATE HEALTH INSURANCE | Admitting: *Deleted

## 2011-11-11 DIAGNOSIS — I4891 Unspecified atrial fibrillation: Secondary | ICD-10-CM

## 2011-11-11 DIAGNOSIS — Z7901 Long term (current) use of anticoagulants: Secondary | ICD-10-CM

## 2011-11-11 DIAGNOSIS — Z954 Presence of other heart-valve replacement: Secondary | ICD-10-CM

## 2011-11-11 LAB — POCT INR: INR: 2.8

## 2011-11-26 ENCOUNTER — Other Ambulatory Visit: Payer: Self-pay | Admitting: Internal Medicine

## 2011-12-01 ENCOUNTER — Other Ambulatory Visit: Payer: Self-pay | Admitting: *Deleted

## 2011-12-09 ENCOUNTER — Ambulatory Visit (INDEPENDENT_AMBULATORY_CARE_PROVIDER_SITE_OTHER): Payer: PRIVATE HEALTH INSURANCE | Admitting: *Deleted

## 2011-12-09 DIAGNOSIS — Z954 Presence of other heart-valve replacement: Secondary | ICD-10-CM

## 2011-12-09 DIAGNOSIS — Z7901 Long term (current) use of anticoagulants: Secondary | ICD-10-CM

## 2011-12-09 DIAGNOSIS — I4891 Unspecified atrial fibrillation: Secondary | ICD-10-CM

## 2011-12-09 LAB — POCT INR: INR: 3.1

## 2011-12-19 ENCOUNTER — Ambulatory Visit: Payer: PRIVATE HEALTH INSURANCE

## 2012-01-06 ENCOUNTER — Ambulatory Visit (INDEPENDENT_AMBULATORY_CARE_PROVIDER_SITE_OTHER): Payer: PRIVATE HEALTH INSURANCE | Admitting: *Deleted

## 2012-01-06 DIAGNOSIS — I4891 Unspecified atrial fibrillation: Secondary | ICD-10-CM

## 2012-01-06 DIAGNOSIS — Z954 Presence of other heart-valve replacement: Secondary | ICD-10-CM

## 2012-01-06 DIAGNOSIS — Z7901 Long term (current) use of anticoagulants: Secondary | ICD-10-CM

## 2012-01-06 LAB — POCT INR: INR: 2.9

## 2012-01-15 ENCOUNTER — Other Ambulatory Visit: Payer: Self-pay | Admitting: Internal Medicine

## 2012-01-17 ENCOUNTER — Emergency Department (INDEPENDENT_AMBULATORY_CARE_PROVIDER_SITE_OTHER)
Admission: EM | Admit: 2012-01-17 | Discharge: 2012-01-17 | Disposition: A | Discharge status: Home / Self Care | Payer: PRIVATE HEALTH INSURANCE | Source: Home / Self Care

## 2012-01-17 ENCOUNTER — Encounter (HOSPITAL_COMMUNITY): Payer: Self-pay | Admitting: Emergency Medicine

## 2012-01-17 DIAGNOSIS — R809 Proteinuria, unspecified: Secondary | ICD-10-CM

## 2012-01-17 DIAGNOSIS — N3090 Cystitis, unspecified without hematuria: Secondary | ICD-10-CM

## 2012-01-17 DIAGNOSIS — R319 Hematuria, unspecified: Secondary | ICD-10-CM

## 2012-01-17 LAB — POCT URINALYSIS DIP (DEVICE)
Bilirubin Urine: NEGATIVE
Glucose, UA: NEGATIVE mg/dL
Ketones, ur: NEGATIVE mg/dL
Leukocytes, UA: NEGATIVE
Nitrite: NEGATIVE
Protein, ur: >=300 mg/dL — AB
Specific Gravity, Urine: 1.025 (ref 1.005–1.030)
Urobilinogen, UA: 0.2 mg/dL (ref 0.0–1.0)
pH: 6 (ref 5.0–8.0)

## 2012-01-17 MED ORDER — CEPHALEXIN 500 MG PO CAPS: 500.0000 mg | ORAL_CAPSULE | Freq: Three times a day (TID) | ORAL | Status: DC

## 2012-01-17 NOTE — ED Provider Notes (Signed)
History     CSN: [id number]  Arrival date & time 01/17/12  1124   None     Chief Complaint  Patient presents with  . Urinary Tract Infection    (Consider location/radiation/quality/duration/timing/severity/associated sxs/prior treatment) HPI Comments: 76 year old female presents with a complaint of feeling pressure in her bladder upon urination. She denies dysuria or frequency. She also denies nausea vomiting or back pain. She states she has been seeing a urologist for proteinuria and has had what sounds like a cystoscopy. He states that she was prescribed the bladder control medication but stated she was unsure of the findings as it relates to the proteinuria. She is denying fever, chills or GI symptoms.   Past Medical History  Diagnosis Date  . Asthma   . Hypertension   . GI bleed   . Arrhythmia     chronic afib NSVT  . RBBB (right bundle branch block)   . Chronic atrial fibrillation   . Dilated cardiomyopathy     history of this, now resolved  . GERD (gastroesophageal reflux disease)   . Dysphagia     Hx  . Nonsustained ventricular tachycardia   . Osteoporosis   . Microcytic anemia     Past Surgical History  Procedure Date  . Cholecystectomy   . Mitral valve prosthesis with angioplasty 1987  . Tubal ligation   . Transthoracic echocardiogram 02/2008, 03/2006, 06/2004  . Polyp removal     Family History  Problem Relation Age of Onset  . Hypertension Mother   . Colon cancer Neg Hx   . Cancer Sister     in the Bladder    History  Substance Use Topics  . Smoking status: Never Smoker   . Smokeless tobacco: Not on file  . Alcohol Use: No    OB History    Grav Para Term Preterm Abortions TAB SAB Ect Mult Living                  Review of Systems  Constitutional: Negative.   HENT: Negative.   Respiratory: Negative.   Cardiovascular: Negative.   Gastrointestinal: Negative.   Genitourinary: Negative for dysuria, frequency, flank pain, vaginal discharge  and difficulty urinating.  Neurological: Negative.     Allergies  Esomeprazole magnesium  Home Medications   Current Outpatient Rx  Name  Route  Sig  Dispense  Refill  . CALCIUM + D PO   Oral   Take by mouth daily.         Marland Kitchen METOPROLOL TARTRATE 50 MG PO TABS      1 1/2 tablet twice daily   45 tablet   1     Patient needs to make appointment.   Marland Kitchen METOPROLOL TARTRATE 50 MG PO TABS      TAKE 1 AND 1/2 TABLETS BY MOUTH TWICE DAILY   180 tablet   3   . PRILOSEC PO   Oral   Take by mouth daily.         . WARFARIN SODIUM 5 MG PO TABS      Take as directed by Anticoagulation clinic   30 tablet   3   . AMOXICILLIN 500 MG PO CAPS      TAKE 4 CAPSULES BY MOUTH 1 HOUR PRIOR TO DENTAL WORK   4 capsule   0   . CARTIA XT 180 MG PO CP24      TAKE ONE CAPSULE BY MOUTH DAILY   90 capsule   10   .  CEPHALEXIN 500 MG PO CAPS   Oral   Take 1 capsule (500 mg total) by mouth 3 (three) times daily.   21 capsule   0   . DIGOXIN 0.125 MG PO TABS      TAKE 1 TABLET BY MOUTH ONCE A DAY   90 tablet   4   . HYDROCHLOROTHIAZIDE 12.5 MG PO CAPS   Oral   Take 12.5 mg by mouth daily.         Marland Kitchen METOPROLOL TARTRATE 50 MG PO TABS      TAKE 1 AND 1/2 TABLETS BY MOUTH TWICE DAILY   180 tablet   3   . MIRABEGRON ER 25 MG PO TB24   Oral   Take 25 mg by mouth daily.         Marland Kitchen PHILLIPS COLON HEALTH PO CAPS   Oral   Take 1 capsule by mouth daily.         . WARFARIN SODIUM 5 MG PO TABS      TAKE AS DIRECTED BY ANTICOAGULATION CLINIC   135 tablet   1     There were no vitals taken for this visit.  Physical Exam  Nursing note and vitals reviewed. Constitutional: She is oriented to person, place, and time. She appears well-developed and well-nourished.  Eyes: Conjunctivae normal and EOM are normal.  Neck: Normal range of motion. Neck supple.  Cardiovascular: Normal rate and normal heart sounds.   Pulmonary/Chest: Effort normal and breath sounds normal.    Abdominal: Soft.       Minor tenderness in the epigastrium however she states this is chronic. No tenderness in the lower abdomen or external pelvis.  Musculoskeletal: Normal range of motion. She exhibits no edema.  Neurological: She is alert and oriented to person, place, and time. She exhibits normal muscle tone.  Skin: Skin is warm and dry. No erythema.  Psychiatric: She has a normal mood and affect.    ED Course  Procedures (including critical care time)  Labs Reviewed  POCT URINALYSIS DIP (DEVICE) - Abnormal; Notable for the following:    Hgb urine dipstick MODERATE (*)     Protein, ur >=300 (*)     All other components within normal limits  URINE CULTURE   No results found.   1. Cystitis   2. Proteinuria   3. Hematuria       MDM  Keflex 500 mg 3 times a day for 7 days Drink plenty of fluids, flushed kidneys and stay well-hydrated Call your urologist for appointment as she missed her last one. Would also recommend follow up with your PCP for additional problems. May return for any new symptoms problems or worsening such as fever increasing pain painful urination, vomiting, or other problems. May also need to go to the emergency department if necessary. A urine culture was ordered         Hayden Rasmussen, NP 01/17/12 1232

## 2012-01-17 NOTE — ED Notes (Signed)
Pt c/o poss UTI x1 week... Sx include: abd pressure, frequency, urgency and back pain... Denies: fevers, vomiting... She is alert w/no signs of acute distress.

## 2012-01-18 LAB — URINE CULTURE

## 2012-01-19 NOTE — ED Provider Notes (Signed)
Medical screening examination/treatment/procedure(s) were performed by resident physician or non-physician practitioner and as supervising physician I was immediately available for consultation/collaboration.   KINDL,JAMES DOUGLAS MD.    James D Kindl, MD 01/19/12 2052 

## 2012-01-22 ENCOUNTER — Ambulatory Visit
Admission: RE | Admit: 2012-01-22 | Discharge: 2012-01-22 | Disposition: A | Discharge status: Home / Self Care | Payer: PRIVATE HEALTH INSURANCE | Source: Ambulatory Visit | Attending: Internal Medicine | Admitting: Internal Medicine

## 2012-01-22 DIAGNOSIS — Z1231 Encounter for screening mammogram for malignant neoplasm of breast: Secondary | ICD-10-CM

## 2012-02-11 ENCOUNTER — Telehealth: Payer: Self-pay | Admitting: Internal Medicine

## 2012-02-11 NOTE — Telephone Encounter (Signed)
Pt needs to have a colonoscopy done on the 31st and Dr. Dulce Sellar needs her to come off her coumadin for 5 days so she needs to stop Sunday and a note was faxed over but we have not responded Dr. Malena Edman phone# is [phone]  Fax# [phone]

## 2012-02-13 ENCOUNTER — Telehealth: Payer: Self-pay | Admitting: Internal Medicine

## 2012-02-13 NOTE — Telephone Encounter (Signed)
Pt having a colonscopy and dr outlaw needs to know what to do re her coumadin, sent request checking on status

## 2012-02-13 NOTE — Telephone Encounter (Signed)
lmom that Dr [name] will not be here until next week and will have him address

## 2012-02-16 ENCOUNTER — Ambulatory Visit (INDEPENDENT_AMBULATORY_CARE_PROVIDER_SITE_OTHER): Payer: PRIVATE HEALTH INSURANCE | Admitting: Pharmacist

## 2012-02-16 DIAGNOSIS — I4891 Unspecified atrial fibrillation: Secondary | ICD-10-CM

## 2012-02-16 DIAGNOSIS — Z954 Presence of other heart-valve replacement: Secondary | ICD-10-CM

## 2012-02-16 DIAGNOSIS — Z7901 Long term (current) use of anticoagulants: Secondary | ICD-10-CM

## 2012-02-16 LAB — POCT INR: INR: 4.4

## 2012-02-17 NOTE — Telephone Encounter (Signed)
Spoke with pt and informed us that we need to know date of her procedure so that we can give stoppage instructions and Lovenox instructions.

## 2012-02-17 NOTE — Telephone Encounter (Signed)
Will need a Lovenox bridge per Dr [name]  Faxed back to requesting office

## 2012-02-17 NOTE — Telephone Encounter (Signed)
She will need bridging with lovenox as she has a mechanical mitral valve.

## 2012-02-19 ENCOUNTER — Telehealth: Payer: Self-pay | Admitting: Internal Medicine

## 2012-02-19 NOTE — Telephone Encounter (Signed)
Pt is aware that she need to stop coumadin on 03/13/12, bring Lovenox injections into office on 03/15/12 at 830am in case 1st dose needs to be given, PM dose if warranted will be given by [facility] Order has been sent to [facility] for Lovenox administration and teaching. They are aware that injections are Q12hrs and pt might need PM dose on 03/15/12. Pt aware to come to office for BMP on 02/24/12 for lovenox dose adjustment and weight.

## 2012-02-19 NOTE — Telephone Encounter (Signed)
New problem:   colonoscopy procedure date is  2/28 @ 2:00 pm  Please advise on bridge of Lovenox.

## 2012-02-19 NOTE — Telephone Encounter (Signed)
Nurse with [facility] needs to talk with taylor's nurse

## 2012-02-20 NOTE — Telephone Encounter (Signed)
Spoke with Melissa RN - [facility]'s liaison, [facility] is not currently accepting any other insurance but medicare or replacement. I took note to Addison Lank Rn coumadin clinic, she will follow up on lovenox injections.

## 2012-02-20 NOTE — Telephone Encounter (Signed)
Called and spoke with Rich Fuchs, she is the intake nurse. She states to send referral to [facility], they will review and probably accept referral as long as there is someone in the home that be taught how to give lovenox injections. i have spoke with [name] and she states her daughter has a friend that can assist with these injections in home. i will send demographics, order and insurance information as requested by [facility]. She also states they will evaluate closer to date of admission the availability of nursing staff to take patient. Faxed Orders as follows:   [name] [address]  [place] [address]   DOB Aug 12, 2035  PATIENT FOR COLONOSCOPY ON 03/19/2012 AT 2:00 PM  Please administer and instruct patients family member on  lovenox injections using the instructions from below:  03/13/12 last dose of coumadin 03/14/12 no coumadin or lovenox 03/15/12 lovenox in the am and pm--admission date 03/16/12 lovenox in the am and pm 03/17/12 lovenox in the am and pm 03/18/12 lovenox in the am  2/28 day of procedure no lovenox prior to procedure We will calculate dose of lovenox closer to date of procedure Vo Dr Tammy Sours Roland Earl, RN

## 2012-02-20 NOTE — Telephone Encounter (Signed)
New Problem    Decline referral, not accepting insurance. Only accepting medicare and medicare replacement right now. Would like to speak to Dr. Lubertha Basque nurse.

## 2012-02-24 ENCOUNTER — Other Ambulatory Visit: Payer: PRIVATE HEALTH INSURANCE

## 2012-02-26 ENCOUNTER — Ambulatory Visit (INDEPENDENT_AMBULATORY_CARE_PROVIDER_SITE_OTHER): Payer: PRIVATE HEALTH INSURANCE | Admitting: *Deleted

## 2012-02-26 DIAGNOSIS — I10 Essential (primary) hypertension: Secondary | ICD-10-CM

## 2012-02-26 LAB — BASIC METABOLIC PANEL
BUN: 27 mg/dL — ABNORMAL HIGH (ref 6–23)
CO2: 25 mEq/L (ref 19–32)
Calcium: 9.6 mg/dL (ref 8.4–10.5)
Chloride: 102 mEq/L (ref 96–112)
Creatinine, Ser: 1.5 mg/dL — ABNORMAL HIGH (ref 0.4–1.2)
GFR: 44.28 mL/min — ABNORMAL LOW (ref >60.00)
Glucose, Bld: 95 mg/dL (ref 70–99)
Potassium: 3.6 mEq/L (ref 3.5–5.1)
Sodium: 137 mEq/L (ref 135–145)

## 2012-02-26 NOTE — Telephone Encounter (Signed)
Pt came into clinic today for lab work and weight.  Pt's weight is 188.4 lbs.  Will await lab results to dose Lovenox.

## 2012-03-08 MED ORDER — ENOXAPARIN SODIUM 80 MG/0.8ML ~~LOC~~ SOLN: 80.0000 mg | Freq: Two times a day (BID) | SUBCUTANEOUS | Status: DC

## 2012-03-08 NOTE — Telephone Encounter (Signed)
Spoke with Debbie intake nurse for [facility], orders faxed with Lovenox instructions. Rx sent to pharmacy. Pt aware. Wt 85kg,r CrCl 44

## 2012-03-12 ENCOUNTER — Telehealth: Payer: Self-pay | Admitting: *Deleted

## 2012-03-12 NOTE — Telephone Encounter (Signed)
Received call from Evert Kohl, wanting to know date of pts procedure and schedule of lovenox injections, we have sent this information but i did refax to [phone] attn-Cindy

## 2012-03-12 NOTE — Telephone Encounter (Signed)
Received call from  Novice at [facility], they are attempting to alleviate their nurses from home health going out so late at night to administer lovenox every 12 hours she is requesting can they administer the Lovenox bid on 7 am and 5 pm schedule.

## 2012-03-12 NOTE — Telephone Encounter (Signed)
Ok per Dr Johney Frame

## 2012-03-12 NOTE — Telephone Encounter (Signed)
Faxed order from MD to [facility]

## 2012-03-12 NOTE — Telephone Encounter (Signed)
Will forward to Dr. Taylor.  

## 2012-03-15 ENCOUNTER — Telehealth: Payer: Self-pay | Admitting: Internal Medicine

## 2012-03-15 NOTE — Telephone Encounter (Signed)
New problem    Lovenox injection will begin this week.

## 2012-03-16 NOTE — Telephone Encounter (Signed)
Aware - dosing complete

## 2012-03-19 ENCOUNTER — Other Ambulatory Visit: Payer: Self-pay | Admitting: Gastroenterology

## 2012-03-24 ENCOUNTER — Telehealth: Payer: Self-pay | Admitting: General Practice

## 2012-03-24 ENCOUNTER — Ambulatory Visit (INDEPENDENT_AMBULATORY_CARE_PROVIDER_SITE_OTHER): Payer: PRIVATE HEALTH INSURANCE | Admitting: *Deleted

## 2012-03-24 DIAGNOSIS — I4891 Unspecified atrial fibrillation: Secondary | ICD-10-CM

## 2012-03-24 DIAGNOSIS — Z7901 Long term (current) use of anticoagulants: Secondary | ICD-10-CM

## 2012-03-24 DIAGNOSIS — Z954 Presence of other heart-valve replacement: Secondary | ICD-10-CM

## 2012-03-24 LAB — POCT INR: INR: 1.9

## 2012-03-24 NOTE — Telephone Encounter (Signed)
LMOM to clarify instructions from coumadin clinic visit this am.

## 2012-04-09 ENCOUNTER — Ambulatory Visit (INDEPENDENT_AMBULATORY_CARE_PROVIDER_SITE_OTHER): Payer: PRIVATE HEALTH INSURANCE | Admitting: Pharmacist

## 2012-04-09 DIAGNOSIS — Z954 Presence of other heart-valve replacement: Secondary | ICD-10-CM

## 2012-04-09 DIAGNOSIS — Z7901 Long term (current) use of anticoagulants: Secondary | ICD-10-CM

## 2012-04-09 DIAGNOSIS — I4891 Unspecified atrial fibrillation: Secondary | ICD-10-CM

## 2012-04-09 LAB — POCT INR: INR: 2.9

## 2012-05-07 ENCOUNTER — Ambulatory Visit (INDEPENDENT_AMBULATORY_CARE_PROVIDER_SITE_OTHER): Payer: PRIVATE HEALTH INSURANCE | Admitting: *Deleted

## 2012-05-07 DIAGNOSIS — Z7901 Long term (current) use of anticoagulants: Secondary | ICD-10-CM

## 2012-05-07 DIAGNOSIS — Z954 Presence of other heart-valve replacement: Secondary | ICD-10-CM

## 2012-05-07 DIAGNOSIS — I4891 Unspecified atrial fibrillation: Secondary | ICD-10-CM

## 2012-05-07 LAB — POCT INR: INR: 2.9

## 2012-05-20 ENCOUNTER — Other Ambulatory Visit: Payer: Self-pay

## 2012-05-20 MED ORDER — AMOXICILLIN 500 MG PO CAPS: ORAL_CAPSULE | ORAL | Status: DC

## 2012-05-20 NOTE — Telephone Encounter (Signed)
This is Dr. Lubertha Basque pt. This was filled by Dr. Myrtis Ser in the past because he was DOD. Thanks.

## 2012-05-28 ENCOUNTER — Other Ambulatory Visit: Payer: Self-pay | Admitting: Internal Medicine

## 2012-05-28 ENCOUNTER — Other Ambulatory Visit: Payer: Self-pay | Admitting: *Deleted

## 2012-06-11 ENCOUNTER — Ambulatory Visit (INDEPENDENT_AMBULATORY_CARE_PROVIDER_SITE_OTHER): Payer: PRIVATE HEALTH INSURANCE | Admitting: Pharmacist

## 2012-06-11 DIAGNOSIS — Z7901 Long term (current) use of anticoagulants: Secondary | ICD-10-CM

## 2012-06-11 DIAGNOSIS — I4891 Unspecified atrial fibrillation: Secondary | ICD-10-CM

## 2012-06-11 DIAGNOSIS — Z954 Presence of other heart-valve replacement: Secondary | ICD-10-CM

## 2012-06-11 LAB — POCT INR: INR: 3.8

## 2012-07-16 ENCOUNTER — Ambulatory Visit (INDEPENDENT_AMBULATORY_CARE_PROVIDER_SITE_OTHER): Payer: PRIVATE HEALTH INSURANCE | Admitting: *Deleted

## 2012-07-16 DIAGNOSIS — Z7901 Long term (current) use of anticoagulants: Secondary | ICD-10-CM

## 2012-07-16 DIAGNOSIS — I4891 Unspecified atrial fibrillation: Secondary | ICD-10-CM

## 2012-07-16 DIAGNOSIS — Z954 Presence of other heart-valve replacement: Secondary | ICD-10-CM

## 2012-07-16 LAB — POCT INR: INR: 3.6

## 2012-08-19 ENCOUNTER — Ambulatory Visit (INDEPENDENT_AMBULATORY_CARE_PROVIDER_SITE_OTHER): Payer: PRIVATE HEALTH INSURANCE | Admitting: *Deleted

## 2012-08-19 DIAGNOSIS — Z954 Presence of other heart-valve replacement: Secondary | ICD-10-CM

## 2012-08-19 DIAGNOSIS — Z7901 Long term (current) use of anticoagulants: Secondary | ICD-10-CM

## 2012-08-19 DIAGNOSIS — I4891 Unspecified atrial fibrillation: Secondary | ICD-10-CM

## 2012-08-19 LAB — POCT INR: INR: 2.9

## 2012-08-20 ENCOUNTER — Telehealth: Payer: Self-pay | Admitting: Internal Medicine

## 2012-08-20 NOTE — Telephone Encounter (Signed)
Walk-In Patient Form completed. Patient needs paper filled out for Disability Parking Placard asap/ Thanks Documentation taken to [employer] in [place]

## 2012-08-26 ENCOUNTER — Telehealth: Payer: Self-pay | Admitting: Internal Medicine

## 2012-08-26 NOTE — Telephone Encounter (Signed)
Form signed and Chaya Jan is going to call patient

## 2012-08-26 NOTE — Telephone Encounter (Signed)
Follow Up   Pt calling to have disability parking forms filled out

## 2012-08-26 NOTE — Telephone Encounter (Deleted)
Received completed parking placard. Called patient for pick up.  Also notified patient that she is due for a follow up w/Dr. [name]. Please make appt. Per Pablo Lawrence

## 2012-08-27 NOTE — Telephone Encounter (Signed)
Received completed parking placard. Called patient for pick up on 8.7.14/djc  Also notified patient that she is due for a follow up w/Dr. [name]. Please make appt. Per Tresa Endo L  Patient arrived to pick up placard on 8.8.14/djc

## 2012-08-29 ENCOUNTER — Other Ambulatory Visit: Payer: Self-pay | Admitting: Internal Medicine

## 2012-09-16 ENCOUNTER — Other Ambulatory Visit: Payer: Self-pay | Admitting: Internal Medicine

## 2012-09-23 ENCOUNTER — Ambulatory Visit (INDEPENDENT_AMBULATORY_CARE_PROVIDER_SITE_OTHER): Payer: PRIVATE HEALTH INSURANCE | Admitting: *Deleted

## 2012-09-23 DIAGNOSIS — Z7901 Long term (current) use of anticoagulants: Secondary | ICD-10-CM

## 2012-09-23 DIAGNOSIS — I4891 Unspecified atrial fibrillation: Secondary | ICD-10-CM

## 2012-09-23 DIAGNOSIS — Z954 Presence of other heart-valve replacement: Secondary | ICD-10-CM

## 2012-09-23 LAB — POCT INR: INR: 3

## 2012-10-21 ENCOUNTER — Encounter: Payer: Self-pay | Admitting: Internal Medicine

## 2012-10-21 ENCOUNTER — Ambulatory Visit (INDEPENDENT_AMBULATORY_CARE_PROVIDER_SITE_OTHER): Payer: PRIVATE HEALTH INSURANCE | Admitting: *Deleted

## 2012-10-21 ENCOUNTER — Ambulatory Visit (INDEPENDENT_AMBULATORY_CARE_PROVIDER_SITE_OTHER): Payer: PRIVATE HEALTH INSURANCE | Admitting: Internal Medicine

## 2012-10-21 VITALS — BP 132/70 | HR 67 | Ht 67.5 in | Wt 180.0 lb

## 2012-10-21 DIAGNOSIS — Z954 Presence of other heart-valve replacement: Secondary | ICD-10-CM

## 2012-10-21 DIAGNOSIS — Z7901 Long term (current) use of anticoagulants: Secondary | ICD-10-CM

## 2012-10-21 DIAGNOSIS — I4891 Unspecified atrial fibrillation: Secondary | ICD-10-CM

## 2012-10-21 DIAGNOSIS — I10 Essential (primary) hypertension: Secondary | ICD-10-CM

## 2012-10-21 LAB — POCT INR: INR: 3.9

## 2012-10-21 NOTE — Assessment & Plan Note (Signed)
Her blood pressure remains well controlled today. No change in medical therapy. I've encouraged the patient to continue exercising and maintain a low-sodium diet.

## 2012-10-21 NOTE — Progress Notes (Signed)
HPI  [name] returns today for followup. She is a very pleasant 77 year old woman with a history of mitral valve disease status post mitral valve replacement, chronic atrial fibrillation, hypertension, nonsustained ventricular tachycardia, also with a history of epistaxis.in the interim, she has done well. She is walking regularly, and exercising. She admits to sodium indiscretion however. No syncope. No peripheral edema. She has not had any residual nosebleeds.   Allergies  Allergen Reactions  . Esomeprazole Magnesium     REACTION: stomach upset, nausea     Current Outpatient Prescriptions  Medication Sig Dispense Refill  . amoxicillin (AMOXIL) 500 MG capsule TAKE 4 CAPSULES BY MOUTH 1 HOUR PRIOR TO DENTAL WORK  4 capsule  0  . BENICAR HCT 20-12.5 MG per tablet Take 1 tablet by mouth daily.      . Calcium Carbonate-Vitamin D (CALCIUM + D PO) Take by mouth daily.      Marland Kitchen CARTIA XT 180 MG 24 hr capsule TAKE ONE CAPSULE BY MOUTH DAILY  90 capsule  10  . DIGOX 125 MCG tablet TAKE 1 TABLET BY MOUTH ONCE A DAY  90 tablet  0  . ibandronate (BONIVA) 150 MG tablet Take 150 mg by mouth every 30 (thirty) days. Take in the morning with a full glass of water, on an empty stomach, and do not take anything else by mouth or lie down for the next 30 min.      . metoprolol (LOPRESSOR) 50 MG tablet TAKE 1 AND 1/2 TABLETS BY MOUTH TWICE DAILY  180 tablet  3  . pantoprazole (PROTONIX) 40 MG tablet Take 1 tablet by mouth daily.      . Probiotic Product (PHILLIPS COLON HEALTH) CAPS Take 1 capsule by mouth daily.      . VESICARE 5 MG tablet Take 1 tablet by mouth daily.      . vitamin E (VITAMIN E) 200 UNIT capsule Take 200 Units by mouth daily.      Marland Kitchen warfarin (COUMADIN) 5 MG tablet TAKE AS DIRECTED BY ANTICOAGULATION CLINIC  135 tablet  0   No current facility-administered medications for this visit.     Past Medical History  Diagnosis Date  . Asthma   . Hypertension   . GI bleed   . Arrhythmia     chronic afib NSVT  . RBBB (right bundle branch block)   . Chronic atrial fibrillation   . Dilated cardiomyopathy     history of this, now resolved  . GERD (gastroesophageal reflux disease)   . Dysphagia     Hx  . Nonsustained ventricular tachycardia   . Osteoporosis   . Microcytic anemia     ROS:   All systems reviewed and negative except as noted in the HPI.   Past Surgical History  Procedure Laterality Date  . Cholecystectomy    . Mitral valve prosthesis with angioplasty  1987  . Tubal ligation    . Transthoracic echocardiogram  02/2008, 03/2006, 06/2004  . Polyp removal       Family History  Problem Relation Age of Onset  . Hypertension Mother   . Colon cancer Neg Hx   . Cancer Sister     in the Bladder     History   Social History  . Marital Status: Divorced    Spouse Name: N/A    Number of Children: N/A  . Years of Education: N/A   Occupational History  . Not on file.   Social History Main Topics  .  Smoking status: Never Smoker   . Smokeless tobacco: Not on file  . Alcohol Use: No  . Drug Use: No  . Sexual Activity: Not on file   Other Topics Concern  . Not on file   Social History Narrative  . No narrative on file     BP 132/70  Pulse 67  Ht 5' 7.5" (1.715 m)  Wt 180 lb (81.647 kg)  BMI 27.76 kg/m2  Physical Exam:  Well appearing 77 year old woman, NAD HEENT: Unremarkable Neck:  No JVD, no thyromegally Lungs:  Clear with no wheezes, rales, or rhonchi. HEART:  IRegular rate rhythm, 2/6 systolic murmur, no rubs, no clicks. Mechanical S1 closure. Abd:  soft, positive bowel sounds, no organomegally, no rebound, no guarding Ext:  2 plus pulses, no edema, no cyanosis, no clubbing Skin:  No rashes no nodules Neuro:  CN II through XII intact, motor grossly intact  EKG Atrial fibrillation with a controlled ventricular response and right bundle branch block.  Assess/Plan:

## 2012-10-21 NOTE — Patient Instructions (Addendum)
Your physician wants you to follow-up in: 12 months with Dr. Taylor. You will receive a reminder letter in the mail two months in advance. If you don't receive a letter, please call our office to schedule the follow-up appointment.    

## 2012-10-21 NOTE — Assessment & Plan Note (Signed)
Her valve sounds remain crisp. She will continue her chronic anticoagulation.

## 2012-10-21 NOTE — Assessment & Plan Note (Signed)
Her ventricular rate in atrial fibrillation remains well controlled. She will continue her current medical therapy. No change in anticoagulation status.

## 2012-11-11 ENCOUNTER — Ambulatory Visit (INDEPENDENT_AMBULATORY_CARE_PROVIDER_SITE_OTHER): Payer: PRIVATE HEALTH INSURANCE | Admitting: General Practice

## 2012-11-11 DIAGNOSIS — Z954 Presence of other heart-valve replacement: Secondary | ICD-10-CM

## 2012-11-11 DIAGNOSIS — I4891 Unspecified atrial fibrillation: Secondary | ICD-10-CM

## 2012-11-11 DIAGNOSIS — Z7901 Long term (current) use of anticoagulants: Secondary | ICD-10-CM

## 2012-11-11 LAB — POCT INR: INR: 3.4

## 2012-11-22 ENCOUNTER — Other Ambulatory Visit: Payer: Self-pay | Admitting: Internal Medicine

## 2012-11-25 ENCOUNTER — Other Ambulatory Visit: Payer: Self-pay | Admitting: Internal Medicine

## 2012-12-10 ENCOUNTER — Ambulatory Visit (INDEPENDENT_AMBULATORY_CARE_PROVIDER_SITE_OTHER): Payer: PRIVATE HEALTH INSURANCE | Admitting: Pharmacist

## 2012-12-10 DIAGNOSIS — I4891 Unspecified atrial fibrillation: Secondary | ICD-10-CM

## 2012-12-10 DIAGNOSIS — Z7901 Long term (current) use of anticoagulants: Secondary | ICD-10-CM

## 2012-12-10 DIAGNOSIS — Z954 Presence of other heart-valve replacement: Secondary | ICD-10-CM

## 2012-12-10 LAB — POCT INR: INR: 4

## 2012-12-17 ENCOUNTER — Other Ambulatory Visit: Payer: Self-pay | Admitting: Internal Medicine

## 2012-12-21 ENCOUNTER — Other Ambulatory Visit: Payer: Self-pay

## 2012-12-21 DIAGNOSIS — Z1231 Encounter for screening mammogram for malignant neoplasm of breast: Secondary | ICD-10-CM

## 2012-12-31 ENCOUNTER — Ambulatory Visit (INDEPENDENT_AMBULATORY_CARE_PROVIDER_SITE_OTHER): Payer: PRIVATE HEALTH INSURANCE | Admitting: General Practice

## 2012-12-31 DIAGNOSIS — Z7901 Long term (current) use of anticoagulants: Secondary | ICD-10-CM

## 2012-12-31 DIAGNOSIS — I4891 Unspecified atrial fibrillation: Secondary | ICD-10-CM

## 2012-12-31 DIAGNOSIS — Z954 Presence of other heart-valve replacement: Secondary | ICD-10-CM

## 2012-12-31 LAB — POCT INR: INR: 3.7

## 2013-01-26 ENCOUNTER — Ambulatory Visit: Payer: PRIVATE HEALTH INSURANCE

## 2013-01-28 ENCOUNTER — Ambulatory Visit (INDEPENDENT_AMBULATORY_CARE_PROVIDER_SITE_OTHER): Payer: PRIVATE HEALTH INSURANCE | Admitting: *Deleted

## 2013-01-28 DIAGNOSIS — Z7901 Long term (current) use of anticoagulants: Secondary | ICD-10-CM

## 2013-01-28 DIAGNOSIS — Z954 Presence of other heart-valve replacement: Secondary | ICD-10-CM

## 2013-01-28 DIAGNOSIS — I4891 Unspecified atrial fibrillation: Secondary | ICD-10-CM

## 2013-01-28 LAB — POCT INR: INR: 4.6

## 2013-02-15 ENCOUNTER — Ambulatory Visit (INDEPENDENT_AMBULATORY_CARE_PROVIDER_SITE_OTHER): Payer: PRIVATE HEALTH INSURANCE | Admitting: *Deleted

## 2013-02-15 DIAGNOSIS — Z5181 Encounter for therapeutic drug level monitoring: Secondary | ICD-10-CM | POA: Insufficient documentation

## 2013-02-15 DIAGNOSIS — I4891 Unspecified atrial fibrillation: Secondary | ICD-10-CM

## 2013-02-15 DIAGNOSIS — Z7901 Long term (current) use of anticoagulants: Secondary | ICD-10-CM

## 2013-02-15 DIAGNOSIS — Z7189 Other specified counseling: Secondary | ICD-10-CM | POA: Insufficient documentation

## 2013-02-15 DIAGNOSIS — Z954 Presence of other heart-valve replacement: Secondary | ICD-10-CM

## 2013-02-15 LAB — POCT INR: INR: 2.9

## 2013-02-16 ENCOUNTER — Ambulatory Visit
Admission: RE | Admit: 2013-02-16 | Discharge: 2013-02-16 | Disposition: A | Discharge status: Home / Self Care | Payer: PRIVATE HEALTH INSURANCE | Source: Ambulatory Visit

## 2013-02-16 DIAGNOSIS — Z1231 Encounter for screening mammogram for malignant neoplasm of breast: Secondary | ICD-10-CM

## 2013-02-19 ENCOUNTER — Other Ambulatory Visit: Payer: Self-pay | Admitting: Internal Medicine

## 2013-02-23 ENCOUNTER — Other Ambulatory Visit: Payer: Self-pay | Admitting: Internal Medicine

## 2013-02-23 NOTE — Telephone Encounter (Signed)
Is this o.k to refill for patient?

## 2013-02-23 NOTE — Telephone Encounter (Signed)
Patient as a mechanical heart valve.  Needs SBE

## 2013-02-24 ENCOUNTER — Other Ambulatory Visit: Payer: Self-pay

## 2013-02-24 MED ORDER — AMOXICILLIN 500 MG PO CAPS: ORAL_CAPSULE | ORAL | Status: DC

## 2013-03-24 ENCOUNTER — Ambulatory Visit (INDEPENDENT_AMBULATORY_CARE_PROVIDER_SITE_OTHER): Payer: PRIVATE HEALTH INSURANCE | Admitting: *Deleted

## 2013-03-24 DIAGNOSIS — Z5181 Encounter for therapeutic drug level monitoring: Secondary | ICD-10-CM

## 2013-03-24 DIAGNOSIS — I4891 Unspecified atrial fibrillation: Secondary | ICD-10-CM

## 2013-03-24 DIAGNOSIS — Z954 Presence of other heart-valve replacement: Secondary | ICD-10-CM

## 2013-03-24 DIAGNOSIS — Z7901 Long term (current) use of anticoagulants: Secondary | ICD-10-CM

## 2013-03-24 LAB — POCT INR: INR: 3.3

## 2013-04-20 ENCOUNTER — Other Ambulatory Visit: Payer: Self-pay | Admitting: Internal Medicine

## 2013-04-25 ENCOUNTER — Ambulatory Visit (INDEPENDENT_AMBULATORY_CARE_PROVIDER_SITE_OTHER): Payer: PRIVATE HEALTH INSURANCE | Admitting: *Deleted

## 2013-04-25 DIAGNOSIS — Z5181 Encounter for therapeutic drug level monitoring: Secondary | ICD-10-CM

## 2013-04-25 DIAGNOSIS — I4891 Unspecified atrial fibrillation: Secondary | ICD-10-CM

## 2013-04-25 DIAGNOSIS — Z954 Presence of other heart-valve replacement: Secondary | ICD-10-CM

## 2013-04-25 LAB — POCT INR: INR: 3.6

## 2013-05-11 ENCOUNTER — Ambulatory Visit (INDEPENDENT_AMBULATORY_CARE_PROVIDER_SITE_OTHER): Payer: PRIVATE HEALTH INSURANCE | Admitting: *Deleted

## 2013-05-11 DIAGNOSIS — Z954 Presence of other heart-valve replacement: Secondary | ICD-10-CM

## 2013-05-11 DIAGNOSIS — Z7901 Long term (current) use of anticoagulants: Secondary | ICD-10-CM

## 2013-05-11 DIAGNOSIS — I4891 Unspecified atrial fibrillation: Secondary | ICD-10-CM

## 2013-05-11 DIAGNOSIS — Z5181 Encounter for therapeutic drug level monitoring: Secondary | ICD-10-CM

## 2013-05-11 LAB — POCT INR: INR: 2.6

## 2013-06-08 ENCOUNTER — Ambulatory Visit (INDEPENDENT_AMBULATORY_CARE_PROVIDER_SITE_OTHER): Payer: PRIVATE HEALTH INSURANCE | Admitting: *Deleted

## 2013-06-08 DIAGNOSIS — Z5181 Encounter for therapeutic drug level monitoring: Secondary | ICD-10-CM

## 2013-06-08 DIAGNOSIS — Z954 Presence of other heart-valve replacement: Secondary | ICD-10-CM

## 2013-06-08 DIAGNOSIS — Z7901 Long term (current) use of anticoagulants: Secondary | ICD-10-CM

## 2013-06-08 DIAGNOSIS — I4891 Unspecified atrial fibrillation: Secondary | ICD-10-CM

## 2013-06-08 LAB — POCT INR: INR: 3.9

## 2013-06-27 ENCOUNTER — Ambulatory Visit (INDEPENDENT_AMBULATORY_CARE_PROVIDER_SITE_OTHER): Payer: PRIVATE HEALTH INSURANCE

## 2013-06-27 DIAGNOSIS — Z954 Presence of other heart-valve replacement: Secondary | ICD-10-CM

## 2013-06-27 DIAGNOSIS — I4891 Unspecified atrial fibrillation: Secondary | ICD-10-CM

## 2013-06-27 DIAGNOSIS — Z7901 Long term (current) use of anticoagulants: Secondary | ICD-10-CM

## 2013-06-27 DIAGNOSIS — Z5181 Encounter for therapeutic drug level monitoring: Secondary | ICD-10-CM

## 2013-06-27 LAB — POCT INR: INR: 2.7

## 2013-07-18 ENCOUNTER — Ambulatory Visit (INDEPENDENT_AMBULATORY_CARE_PROVIDER_SITE_OTHER): Payer: PRIVATE HEALTH INSURANCE | Admitting: *Deleted

## 2013-07-18 DIAGNOSIS — Z954 Presence of other heart-valve replacement: Secondary | ICD-10-CM

## 2013-07-18 DIAGNOSIS — Z7901 Long term (current) use of anticoagulants: Secondary | ICD-10-CM

## 2013-07-18 DIAGNOSIS — I4891 Unspecified atrial fibrillation: Secondary | ICD-10-CM

## 2013-07-18 DIAGNOSIS — Z5181 Encounter for therapeutic drug level monitoring: Secondary | ICD-10-CM

## 2013-07-18 LAB — POCT INR: INR: 2.2

## 2013-07-26 ENCOUNTER — Emergency Department (INDEPENDENT_AMBULATORY_CARE_PROVIDER_SITE_OTHER)
Admission: EM | Admit: 2013-07-26 | Discharge: 2013-07-26 | Disposition: A | Discharge status: Home / Self Care | Payer: PRIVATE HEALTH INSURANCE | Source: Home / Self Care

## 2013-07-26 ENCOUNTER — Encounter (HOSPITAL_COMMUNITY): Payer: Self-pay | Admitting: Emergency Medicine

## 2013-07-26 DIAGNOSIS — N390 Urinary tract infection, site not specified: Secondary | ICD-10-CM

## 2013-07-26 LAB — POCT URINALYSIS DIP (DEVICE)
Bilirubin Urine: NEGATIVE
Glucose, UA: NEGATIVE mg/dL
Ketones, ur: NEGATIVE mg/dL
Nitrite: POSITIVE — AB
Protein, ur: 100 mg/dL — AB
Specific Gravity, Urine: 1.02 (ref 1.005–1.030)
Urobilinogen, UA: 0.2 mg/dL (ref 0.0–1.0)
pH: 5.5 (ref 5.0–8.0)

## 2013-07-26 MED ORDER — CEPHALEXIN 500 MG PO CAPS: 500.0000 mg | ORAL_CAPSULE | Freq: Four times a day (QID) | ORAL | Status: DC

## 2013-07-26 NOTE — ED Provider Notes (Signed)
CSN: [id number]     Arrival date & time 07/26/13  1235 History   None    Chief Complaint  Patient presents with  . Urinary Tract Infection   (Consider location/radiation/quality/duration/timing/severity/associated sxs/prior Treatment) Patient is a 78 y.o. female presenting with urinary tract infection. The history is provided by the patient.  Urinary Tract Infection This is a new problem. The current episode started more than 2 days ago. The problem has been gradually worsening. Pertinent negatives include no chest pain and no abdominal pain.    Past Medical History  Diagnosis Date  . Asthma   . Hypertension   . GI bleed   . Arrhythmia     chronic afib NSVT  . RBBB (right bundle branch block)   . Chronic atrial fibrillation   . Dilated cardiomyopathy     history of this, now resolved  . GERD (gastroesophageal reflux disease)   . Dysphagia     Hx  . Nonsustained ventricular tachycardia   . Osteoporosis   . Microcytic anemia    Past Surgical History  Procedure Laterality Date  . Cholecystectomy    . Mitral valve prosthesis with angioplasty  1987  . Tubal ligation    . Transthoracic echocardiogram  02/2008, 03/2006, 06/2004  . Polyp removal     Family History  Problem Relation Age of Onset  . Hypertension Mother   . Colon cancer Neg Hx   . Cancer Sister     in the Bladder   History  Substance Use Topics  . Smoking status: Never Smoker   . Smokeless tobacco: Not on file  . Alcohol Use: No   OB History   Grav Para Term Preterm Abortions TAB SAB Ect Mult Living                 Review of Systems  Constitutional: Negative.  Negative for fever.  Cardiovascular: Negative for chest pain.  Gastrointestinal: Negative.  Negative for nausea, vomiting, abdominal pain and diarrhea.  Genitourinary: Positive for urgency, frequency and hematuria. Negative for dysuria, flank pain, vaginal bleeding, vaginal discharge and pelvic pain.    Allergies  Esomeprazole magnesium  Home  Medications   Prior to Admission medications   Medication Sig Start Date End Date Taking? Authorizing Provider  amoxicillin (AMOXIL) 500 MG capsule TAKE 4 CAPSULES BY MOUTH 1 HOUR PRIOR TO DENTAL WORK 02/24/13   Marinus MawGregg W Taylor, MD  BENICAR HCT 20-12.5 MG per tablet Take 1 tablet by mouth daily. 10/20/12   Historical Provider, MD  Calcium Carbonate-Vitamin D (CALCIUM + D PO) Take by mouth daily.    Historical Provider, MD  CARTIA XT 180 MG 24 hr capsule TAKE ONE CAPSULE BY MOUTH DAILY 11/22/12   Marinus MawGregg W Taylor, MD  cephALEXin (KEFLEX) 500 MG capsule Take 1 capsule (500 mg total) by mouth 4 (four) times daily. Take all of medicine and drink lots of fluids 07/26/13   Linna HoffJames D Righteous Claiborne, MD  DIGOX 125 MCG tablet TAKE 1 TABLET BY MOUTH DAILY 12/17/12   Marinus MawGregg W Taylor, MD  ibandronate (BONIVA) 150 MG tablet Take 150 mg by mouth every 30 (thirty) days. Take in the morning with a full glass of water, on an empty stomach, and do not take anything else by mouth or lie down for the next 30 min.    Historical Provider, MD  loperamide (IMODIUM A-D) 2 MG tablet Take 2 mg by mouth 4 (four) times daily as needed for diarrhea or loose stools.    Historical  Provider, MD  metoprolol (LOPRESSOR) 50 MG tablet TAKE 1 AND 1/2 TABLETS BY MOUTH TWICE DAILY 06/03/11   Marinus MawGregg W Taylor, MD  metoprolol (LOPRESSOR) 50 MG tablet TAKE 1 AND 1/2 TABLETS BY MOUTH TWICE DAILY 11/25/12   Marinus MawGregg W Taylor, MD  pantoprazole (PROTONIX) 40 MG tablet Take 1 tablet by mouth daily. 08/10/12   Historical Provider, MD  Probiotic Product (PHILLIPS COLON HEALTH) CAPS Take 1 capsule by mouth daily.    Historical Provider, MD  VESICARE 5 MG tablet Take 1 tablet by mouth daily. 09/30/12   Historical Provider, MD  vitamin E (VITAMIN E) 200 UNIT capsule Take 200 Units by mouth daily.    Historical Provider, MD  warfarin (COUMADIN) 5 MG tablet TAKE AS DIRECTED BY COUMADIN CLINIC 04/20/13   Marinus MawGregg W Taylor, MD   BP 160/94  Pulse 72  Temp(Src) 97 F (36.1 C) (Oral)   Resp 16  SpO2 97% Physical Exam  Nursing note and vitals reviewed. Constitutional: She is oriented to person, place, and time. She appears well-developed and well-nourished.  Neck: Normal range of motion. Neck supple.  Abdominal: Soft. Bowel sounds are normal. She exhibits no distension and no mass. There is no tenderness. There is no rebound and no guarding.  Lymphadenopathy:    She has no cervical adenopathy.  Neurological: She is alert and oriented to person, place, and time.  Skin: Skin is warm and dry.    ED Course  Procedures (including critical care time) Labs Review Labs Reviewed  POCT URINALYSIS DIP (DEVICE) - Abnormal; Notable for the following:    Hgb urine dipstick LARGE (*)    Protein, ur 100 (*)    Nitrite POSITIVE (*)    Leukocytes, UA SMALL (*)    All other components within normal limits    Imaging Review No results found.   MDM   1. UTI (lower urinary tract infection)        Linna HoffJames D Beyonka Pitney, MD 07/26/13 1335

## 2013-07-26 NOTE — ED Notes (Signed)
C/o uti  States she did see blood in urine Denies any pain or urinary problems

## 2013-07-26 NOTE — Discharge Instructions (Signed)
Take all of medicine as directed, drink lots of fluids, see your doctor in 1 week for urine recheck

## 2013-08-01 ENCOUNTER — Other Ambulatory Visit: Payer: Self-pay | Admitting: Internal Medicine

## 2013-08-08 ENCOUNTER — Ambulatory Visit (INDEPENDENT_AMBULATORY_CARE_PROVIDER_SITE_OTHER): Payer: PRIVATE HEALTH INSURANCE | Admitting: *Deleted

## 2013-08-08 DIAGNOSIS — Z5181 Encounter for therapeutic drug level monitoring: Secondary | ICD-10-CM

## 2013-08-08 DIAGNOSIS — I4891 Unspecified atrial fibrillation: Secondary | ICD-10-CM

## 2013-08-08 DIAGNOSIS — Z954 Presence of other heart-valve replacement: Secondary | ICD-10-CM

## 2013-08-08 DIAGNOSIS — Z7901 Long term (current) use of anticoagulants: Secondary | ICD-10-CM

## 2013-08-08 LAB — POCT INR: INR: 2.1

## 2013-08-18 ENCOUNTER — Ambulatory Visit (INDEPENDENT_AMBULATORY_CARE_PROVIDER_SITE_OTHER): Payer: PRIVATE HEALTH INSURANCE | Admitting: Pharmacist

## 2013-08-18 DIAGNOSIS — Z5181 Encounter for therapeutic drug level monitoring: Secondary | ICD-10-CM

## 2013-08-18 DIAGNOSIS — I4891 Unspecified atrial fibrillation: Secondary | ICD-10-CM

## 2013-08-18 DIAGNOSIS — Z7901 Long term (current) use of anticoagulants: Secondary | ICD-10-CM

## 2013-08-18 DIAGNOSIS — Z954 Presence of other heart-valve replacement: Secondary | ICD-10-CM

## 2013-08-18 LAB — POCT INR: INR: 3.3

## 2013-09-01 ENCOUNTER — Ambulatory Visit (INDEPENDENT_AMBULATORY_CARE_PROVIDER_SITE_OTHER): Payer: 59

## 2013-09-01 ENCOUNTER — Ambulatory Visit (INDEPENDENT_AMBULATORY_CARE_PROVIDER_SITE_OTHER): Payer: 59 | Admitting: Podiatry

## 2013-09-01 ENCOUNTER — Encounter: Payer: Self-pay | Admitting: Podiatry

## 2013-09-01 DIAGNOSIS — M779 Enthesopathy, unspecified: Secondary | ICD-10-CM

## 2013-09-01 DIAGNOSIS — M216X9 Other acquired deformities of unspecified foot: Secondary | ICD-10-CM

## 2013-09-01 DIAGNOSIS — L600 Ingrowing nail: Secondary | ICD-10-CM

## 2013-09-01 DIAGNOSIS — L84 Corns and callosities: Secondary | ICD-10-CM

## 2013-09-01 NOTE — Progress Notes (Signed)
   Subjective:    Patient ID: [name], female    DOB: 1935-07-23, 78 y.o.   MRN: [id number]  HPI Comments: "I have a few things"  Patient c/o callused areas plantar forefoot bilateral, right over left, for few years.   Also, she says that she has burning in the toes bilateral   And, concerned about discoloration of the 1st toenail right.  She hasn't tried any home treatments  Toe Pain       Review of Systems  Constitutional: Positive for diaphoresis.  HENT: Positive for hearing loss and sinus pressure.   Eyes: Positive for itching and visual disturbance.  Gastrointestinal: Positive for abdominal pain, blood in stool and abdominal distention.  Genitourinary: Positive for frequency.  Hematological: Bruises/bleeds easily.  All other systems reviewed and are negative.      Objective:   Physical Exam        Assessment & Plan:

## 2013-09-02 NOTE — Progress Notes (Signed)
Subjective:     Patient ID: [name], female   DOB: 1935-09-13, 78 y.o.   MRN: [id number]  Toe Pain    patient presents with several different problems including nail disease painful lesion plantar aspect of foot and burning of a nondescript nature   Review of Systems  All other systems reviewed and are negative.      Objective:   Physical Exam  Nursing note and vitals reviewed. Constitutional: She is oriented to person, place, and time.  Cardiovascular: Intact distal pulses.   Musculoskeletal: Normal range of motion.  Neurological: She is oriented to person, place, and time.  Skin: Skin is warm.   neurovascular status intact with muscle strength adequate and range of motion of the subtalar and midtarsal joint within normal limits. Patient is found to have painful keratotic lesion plantar aspect metatarsal left and nail disease with discoloration hallux of both feet and no indications of diminishment of sharp dull or vibratory     Assessment:     Keratotic lesion secondary to foot position mild fungus and possibility for initial type neuropathy    Plan:     H&P and conditions discussed and debridement accomplished. Patient will monitor her the hurting and if he gets worse we'll see her back for consideration of treatment and she'll be seen back if lesion needs further trimming

## 2013-09-13 ENCOUNTER — Ambulatory Visit (INDEPENDENT_AMBULATORY_CARE_PROVIDER_SITE_OTHER): Payer: PRIVATE HEALTH INSURANCE | Admitting: *Deleted

## 2013-09-13 DIAGNOSIS — Z7901 Long term (current) use of anticoagulants: Secondary | ICD-10-CM

## 2013-09-13 DIAGNOSIS — Z5181 Encounter for therapeutic drug level monitoring: Secondary | ICD-10-CM

## 2013-09-13 DIAGNOSIS — Z954 Presence of other heart-valve replacement: Secondary | ICD-10-CM

## 2013-09-13 DIAGNOSIS — I4891 Unspecified atrial fibrillation: Secondary | ICD-10-CM

## 2013-09-13 LAB — POCT INR: INR: 4.4

## 2013-09-23 ENCOUNTER — Other Ambulatory Visit: Payer: Self-pay | Admitting: Internal Medicine

## 2013-10-04 ENCOUNTER — Ambulatory Visit (INDEPENDENT_AMBULATORY_CARE_PROVIDER_SITE_OTHER): Payer: PRIVATE HEALTH INSURANCE | Admitting: *Deleted

## 2013-10-04 DIAGNOSIS — Z954 Presence of other heart-valve replacement: Secondary | ICD-10-CM

## 2013-10-04 DIAGNOSIS — Z5181 Encounter for therapeutic drug level monitoring: Secondary | ICD-10-CM

## 2013-10-04 DIAGNOSIS — I4891 Unspecified atrial fibrillation: Secondary | ICD-10-CM

## 2013-10-04 DIAGNOSIS — Z7901 Long term (current) use of anticoagulants: Secondary | ICD-10-CM

## 2013-10-04 LAB — POCT INR: INR: 2.8

## 2013-10-25 ENCOUNTER — Ambulatory Visit (INDEPENDENT_AMBULATORY_CARE_PROVIDER_SITE_OTHER): Payer: PRIVATE HEALTH INSURANCE | Admitting: *Deleted

## 2013-10-25 DIAGNOSIS — Z954 Presence of other heart-valve replacement: Secondary | ICD-10-CM

## 2013-10-25 DIAGNOSIS — I4891 Unspecified atrial fibrillation: Secondary | ICD-10-CM

## 2013-10-25 DIAGNOSIS — Z23 Encounter for immunization: Secondary | ICD-10-CM

## 2013-10-25 DIAGNOSIS — Z7901 Long term (current) use of anticoagulants: Secondary | ICD-10-CM

## 2013-10-25 DIAGNOSIS — Z952 Presence of prosthetic heart valve: Secondary | ICD-10-CM

## 2013-10-25 DIAGNOSIS — Z5181 Encounter for therapeutic drug level monitoring: Secondary | ICD-10-CM

## 2013-10-25 LAB — POCT INR: INR: 2.6

## 2013-11-21 ENCOUNTER — Other Ambulatory Visit: Payer: Self-pay | Admitting: Internal Medicine

## 2013-11-30 ENCOUNTER — Ambulatory Visit (INDEPENDENT_AMBULATORY_CARE_PROVIDER_SITE_OTHER): Payer: PRIVATE HEALTH INSURANCE | Admitting: *Deleted

## 2013-11-30 DIAGNOSIS — I4891 Unspecified atrial fibrillation: Secondary | ICD-10-CM

## 2013-11-30 DIAGNOSIS — Z7901 Long term (current) use of anticoagulants: Secondary | ICD-10-CM

## 2013-11-30 DIAGNOSIS — Z5181 Encounter for therapeutic drug level monitoring: Secondary | ICD-10-CM

## 2013-11-30 DIAGNOSIS — Z954 Presence of other heart-valve replacement: Secondary | ICD-10-CM

## 2013-11-30 DIAGNOSIS — Z952 Presence of prosthetic heart valve: Secondary | ICD-10-CM

## 2013-11-30 LAB — POCT INR: INR: 3.8

## 2013-12-20 ENCOUNTER — Other Ambulatory Visit: Payer: Self-pay | Admitting: Internal Medicine

## 2013-12-23 ENCOUNTER — Ambulatory Visit (INDEPENDENT_AMBULATORY_CARE_PROVIDER_SITE_OTHER): Payer: PRIVATE HEALTH INSURANCE | Admitting: *Deleted

## 2013-12-23 DIAGNOSIS — Z7901 Long term (current) use of anticoagulants: Secondary | ICD-10-CM

## 2013-12-23 DIAGNOSIS — Z5181 Encounter for therapeutic drug level monitoring: Secondary | ICD-10-CM

## 2013-12-23 DIAGNOSIS — I4891 Unspecified atrial fibrillation: Secondary | ICD-10-CM

## 2013-12-23 DIAGNOSIS — Z954 Presence of other heart-valve replacement: Secondary | ICD-10-CM

## 2013-12-23 DIAGNOSIS — Z952 Presence of prosthetic heart valve: Secondary | ICD-10-CM

## 2013-12-23 LAB — POCT INR: INR: 3.4

## 2013-12-29 ENCOUNTER — Other Ambulatory Visit: Payer: Self-pay | Admitting: Internal Medicine

## 2014-01-20 ENCOUNTER — Other Ambulatory Visit: Payer: Self-pay | Admitting: Internal Medicine

## 2014-01-23 ENCOUNTER — Ambulatory Visit (INDEPENDENT_AMBULATORY_CARE_PROVIDER_SITE_OTHER): Payer: Medicare Other

## 2014-01-23 DIAGNOSIS — I4891 Unspecified atrial fibrillation: Secondary | ICD-10-CM

## 2014-01-23 DIAGNOSIS — Z7901 Long term (current) use of anticoagulants: Secondary | ICD-10-CM

## 2014-01-23 DIAGNOSIS — Z954 Presence of other heart-valve replacement: Secondary | ICD-10-CM

## 2014-01-23 DIAGNOSIS — Z952 Presence of prosthetic heart valve: Secondary | ICD-10-CM

## 2014-01-23 DIAGNOSIS — Z5181 Encounter for therapeutic drug level monitoring: Secondary | ICD-10-CM

## 2014-01-23 LAB — POCT INR: INR: 2.3

## 2014-01-27 ENCOUNTER — Encounter: Payer: Self-pay | Admitting: Internal Medicine

## 2014-02-01 ENCOUNTER — Other Ambulatory Visit: Payer: Self-pay

## 2014-02-01 DIAGNOSIS — Z1231 Encounter for screening mammogram for malignant neoplasm of breast: Secondary | ICD-10-CM

## 2014-02-19 ENCOUNTER — Other Ambulatory Visit: Payer: Self-pay | Admitting: Internal Medicine

## 2014-02-21 ENCOUNTER — Ambulatory Visit
Admission: RE | Admit: 2014-02-21 | Discharge: 2014-02-21 | Disposition: A | Discharge status: Home / Self Care | Payer: Medicare Other | Source: Ambulatory Visit

## 2014-02-21 DIAGNOSIS — Z1231 Encounter for screening mammogram for malignant neoplasm of breast: Secondary | ICD-10-CM

## 2014-02-24 ENCOUNTER — Ambulatory Visit (INDEPENDENT_AMBULATORY_CARE_PROVIDER_SITE_OTHER): Payer: Medicare Other

## 2014-02-24 DIAGNOSIS — Z952 Presence of prosthetic heart valve: Secondary | ICD-10-CM

## 2014-02-24 DIAGNOSIS — Z5181 Encounter for therapeutic drug level monitoring: Secondary | ICD-10-CM

## 2014-02-24 DIAGNOSIS — I4891 Unspecified atrial fibrillation: Secondary | ICD-10-CM

## 2014-02-24 DIAGNOSIS — Z954 Presence of other heart-valve replacement: Secondary | ICD-10-CM

## 2014-02-24 DIAGNOSIS — Z7901 Long term (current) use of anticoagulants: Secondary | ICD-10-CM

## 2014-02-24 LAB — POCT INR: INR: 1.7

## 2014-02-28 ENCOUNTER — Ambulatory Visit (INDEPENDENT_AMBULATORY_CARE_PROVIDER_SITE_OTHER): Payer: Medicare Other | Admitting: Internal Medicine

## 2014-02-28 ENCOUNTER — Encounter: Payer: Self-pay | Admitting: Internal Medicine

## 2014-02-28 VITALS — BP 134/84 | HR 62 | Ht 67.5 in | Wt 174.2 lb

## 2014-02-28 DIAGNOSIS — I10 Essential (primary) hypertension: Secondary | ICD-10-CM

## 2014-02-28 DIAGNOSIS — I4891 Unspecified atrial fibrillation: Secondary | ICD-10-CM

## 2014-02-28 DIAGNOSIS — I4729 Other ventricular tachycardia: Secondary | ICD-10-CM

## 2014-02-28 DIAGNOSIS — I472 Ventricular tachycardia: Secondary | ICD-10-CM

## 2014-02-28 NOTE — Progress Notes (Signed)
HPI  [name] returns today for followup. She is a very pleasant 79 year old woman with a history of mitral valve disease status post mitral valve replacement, chronic atrial fibrillation, hypertension, nonsustained ventricular tachycardia, also with a history of epistaxis. In the interim, she has done well. She is walking regularly, and exercising. She admits to sodium indiscretion however. No syncope. No peripheral edema. She has not had any residual nosebleeds.   Allergies  Allergen Reactions  . Esomeprazole Magnesium     REACTION: stomach upset, nausea     Current Outpatient Prescriptions  Medication Sig Dispense Refill  . amoxicillin (AMOXIL) 500 MG capsule TAKE 4 CAPSULES BY MOUTH 1 HOUR PRIOR TO DENTAL WORK 4 capsule 0  . BENICAR HCT 20-12.5 MG per tablet Take 1 tablet by mouth daily.    . Calcium Carbonate-Vitamin D (CALCIUM + D PO) Take by mouth daily.    Marland Kitchen CARTIA XT 180 MG 24 hr capsule TAKE ONE CAPSULE BY MOUTH DAILY 90 capsule 6  . DIGOX 125 MCG tablet TAKE 1 TABLET BY MOUTH DAILY 90 tablet 4  . dorzolamide-timolol (COSOPT) 22.3-6.8 MG/ML ophthalmic solution Place 1 drop into both eyes 2 (two) times daily.   3  . latanoprost (XALATAN) 0.005 % ophthalmic solution Place 1 drop into both eyes at bedtime.   12  . loperamide (IMODIUM A-D) 2 MG tablet Take 2 mg by mouth 4 (four) times daily as needed for diarrhea or loose stools.    . metoprolol (LOPRESSOR) 50 MG tablet TAKE 1 1/2 TABLET BY MOUTH TWICE DAILY 90 tablet 0  . pantoprazole (PROTONIX) 40 MG tablet Take 1 tablet by mouth daily.    . VESICARE 5 MG tablet Take 1 tablet by mouth daily.    . vitamin E (VITAMIN E) 200 UNIT capsule Take 200 Units by mouth daily.    Marland Kitchen warfarin (COUMADIN) 5 MG tablet TAKE AS DIRECTED BY COUMADIN CLINIC 100 tablet 0   No current facility-administered medications for this visit.     Past Medical History  Diagnosis Date  . Asthma   . Hypertension   . GI bleed   . Arrhythmia     chronic  afib NSVT  . RBBB (right bundle branch block)   . Chronic atrial fibrillation   . Dilated cardiomyopathy     history of this, now resolved  . GERD (gastroesophageal reflux disease)   . Dysphagia     Hx  . Nonsustained ventricular tachycardia   . Osteoporosis   . Microcytic anemia     ROS:   All systems reviewed and negative except as noted in the HPI.   Past Surgical History  Procedure Laterality Date  . Cholecystectomy    . Mitral valve prosthesis with angioplasty  1987  . Tubal ligation    . Transthoracic echocardiogram  02/2008, 03/2006, 06/2004  . Polyp removal       Family History  Problem Relation Age of Onset  . Hypertension Mother   . Colon cancer Neg Hx   . Cancer Sister     in the Bladder     History   Social History  . Marital Status: Divorced    Spouse Name: N/A    Number of Children: N/A  . Years of Education: N/A   Occupational History  . Not on file.   Social History Main Topics  . Smoking status: Never Smoker   . Smokeless tobacco: Not on file  . Alcohol Use: No  . Drug Use: No  .  Sexual Activity: Not on file   Other Topics Concern  . Not on file   Social History Narrative     BP 134/84 mmHg  Pulse 62  Ht 5' 7.5" (1.715 m)  Wt 174 lb 3.2 oz (79.017 kg)  BMI 26.87 kg/m2  Physical Exam:  Well appearing 79 year old woman, NAD HEENT: Unremarkable Neck:  No JVD, no thyromegally Lungs:  Clear with no wheezes, rales, or rhonchi. HEART:  IRegular rate rhythm, 2/6 systolic murmur, no rubs, no clicks. Mechanical S1 closure. Abd:  soft, positive bowel sounds, no organomegally, no rebound, no guarding Ext:  2 plus pulses, trace edema, no cyanosis, no clubbing Skin:  No rashes no nodules Neuro:  CN II through XII intact, motor grossly intact  EKG Atrial fibrillation with a controlled ventricular response and right bundle branch block.  Assess/Plan:

## 2014-02-28 NOTE — Assessment & Plan Note (Signed)
Her blood pressure is well controlled. She does have a problem at times with sodium indiscretion. Will follow. She will continue her current meds.

## 2014-02-28 NOTE — Assessment & Plan Note (Signed)
She has been well rate controlled and is asymptomatic. Previously she was very difficult to rate control. Now she is not. She will continue her current meds.

## 2014-02-28 NOTE — Assessment & Plan Note (Signed)
She has had no syncope or any recurrent ventricular arrhythmias. She will continue her current meds.

## 2014-02-28 NOTE — Patient Instructions (Signed)
Your physician recommends that you continue on your current medications as directed. Please refer to the Current Medication list given to you today.  Your physician wants you to follow-up in: 1 year with Dr. Taylor.  You will receive a reminder letter in the mail two months in advance. If you don't receive a letter, please call our office to schedule the follow-up appointment.  

## 2014-03-08 ENCOUNTER — Other Ambulatory Visit: Payer: Self-pay | Admitting: Internal Medicine

## 2014-03-10 ENCOUNTER — Ambulatory Visit (INDEPENDENT_AMBULATORY_CARE_PROVIDER_SITE_OTHER): Payer: Medicare Other

## 2014-03-10 DIAGNOSIS — Z952 Presence of prosthetic heart valve: Secondary | ICD-10-CM

## 2014-03-10 DIAGNOSIS — I4891 Unspecified atrial fibrillation: Secondary | ICD-10-CM

## 2014-03-10 DIAGNOSIS — Z954 Presence of other heart-valve replacement: Secondary | ICD-10-CM

## 2014-03-10 DIAGNOSIS — Z5181 Encounter for therapeutic drug level monitoring: Secondary | ICD-10-CM

## 2014-03-10 DIAGNOSIS — Z7901 Long term (current) use of anticoagulants: Secondary | ICD-10-CM

## 2014-03-10 LAB — POCT INR: INR: 1.7

## 2014-03-21 ENCOUNTER — Other Ambulatory Visit: Payer: Self-pay | Admitting: Internal Medicine

## 2014-03-24 ENCOUNTER — Other Ambulatory Visit: Payer: Self-pay | Admitting: Internal Medicine

## 2014-03-24 ENCOUNTER — Ambulatory Visit (INDEPENDENT_AMBULATORY_CARE_PROVIDER_SITE_OTHER): Payer: Medicare Other | Admitting: *Deleted

## 2014-03-24 DIAGNOSIS — Z7901 Long term (current) use of anticoagulants: Secondary | ICD-10-CM

## 2014-03-24 DIAGNOSIS — Z5181 Encounter for therapeutic drug level monitoring: Secondary | ICD-10-CM

## 2014-03-24 DIAGNOSIS — Z952 Presence of prosthetic heart valve: Secondary | ICD-10-CM

## 2014-03-24 DIAGNOSIS — Z954 Presence of other heart-valve replacement: Secondary | ICD-10-CM

## 2014-03-24 DIAGNOSIS — I4891 Unspecified atrial fibrillation: Secondary | ICD-10-CM

## 2014-03-24 LAB — POCT INR: INR: 2.7

## 2014-04-13 ENCOUNTER — Encounter (HOSPITAL_COMMUNITY): Payer: Medicare Other

## 2014-04-18 ENCOUNTER — Ambulatory Visit (INDEPENDENT_AMBULATORY_CARE_PROVIDER_SITE_OTHER): Payer: Medicare Other

## 2014-04-18 DIAGNOSIS — Z954 Presence of other heart-valve replacement: Secondary | ICD-10-CM

## 2014-04-18 DIAGNOSIS — Z7901 Long term (current) use of anticoagulants: Secondary | ICD-10-CM | POA: Diagnosis not present

## 2014-04-18 DIAGNOSIS — I4891 Unspecified atrial fibrillation: Secondary | ICD-10-CM | POA: Diagnosis not present

## 2014-04-18 DIAGNOSIS — Z5181 Encounter for therapeutic drug level monitoring: Secondary | ICD-10-CM

## 2014-04-18 DIAGNOSIS — Z952 Presence of prosthetic heart valve: Secondary | ICD-10-CM

## 2014-04-18 LAB — POCT INR: INR: 2.3

## 2014-04-20 ENCOUNTER — Other Ambulatory Visit: Payer: Self-pay | Admitting: Internal Medicine

## 2014-04-21 DIAGNOSIS — K649 Unspecified hemorrhoids: Secondary | ICD-10-CM

## 2014-04-21 HISTORY — PX: EYE SURGERY: SHX253

## 2014-04-21 HISTORY — DX: Unspecified hemorrhoids: K64.9

## 2014-05-08 ENCOUNTER — Ambulatory Visit (INDEPENDENT_AMBULATORY_CARE_PROVIDER_SITE_OTHER): Payer: Medicare Other | Admitting: *Deleted

## 2014-05-08 DIAGNOSIS — Z954 Presence of other heart-valve replacement: Secondary | ICD-10-CM

## 2014-05-08 DIAGNOSIS — I4891 Unspecified atrial fibrillation: Secondary | ICD-10-CM | POA: Diagnosis not present

## 2014-05-08 DIAGNOSIS — Z7901 Long term (current) use of anticoagulants: Secondary | ICD-10-CM

## 2014-05-08 DIAGNOSIS — Z5181 Encounter for therapeutic drug level monitoring: Secondary | ICD-10-CM | POA: Diagnosis not present

## 2014-05-08 DIAGNOSIS — Z952 Presence of prosthetic heart valve: Secondary | ICD-10-CM

## 2014-05-08 LAB — POCT INR: INR: 2.6

## 2014-05-09 ENCOUNTER — Telehealth: Payer: Self-pay | Admitting: *Deleted

## 2014-05-09 NOTE — Telephone Encounter (Signed)
Spoke with Zella BallRobin at Dr Hosie PoissonWhitaker's office and she confirmed that pt did not need to hold coumadin for Glaucoma Surgery on April 27th  Also called and spoke with pt and re emphasized that she does not need to hold coumadin for the Glaucoma surgery on April 27th and she states understanding.

## 2014-05-15 ENCOUNTER — Other Ambulatory Visit: Payer: Self-pay | Admitting: Ophthalmology

## 2014-05-15 MED ORDER — TETRACAINE HCL 0.5 % OP SOLN: 1.0000 [drp] | OPHTHALMIC | Status: AC

## 2014-05-15 MED ORDER — MITOMYCIN-C INJECTION USE IN OR ONLY (0.4 MG/ML): 0.5000 mL | Freq: Once | INTRAVENOUS | Status: DC

## 2014-05-15 NOTE — H&P (Signed)
History & Physical:   DATE:   05-04-14  NAME:  [name], [name]     [id number]       HISTORY OF PRESENT ILLNESS: Dr Lavell Islam [facility]mol[facility]    Chief Eye Complaints glaucoma   Patient states vision is better in OS than OD. Dr Randon Goldsmith has tried patient on beta blocker and prostaglandin's and Cosopt in which none has help her IOP. Patient is now on Travatan Z at night. No pain in OU . Patient was diagnosed with mixed mechanism glaucoma OU.   HPI: EYES: Reports symptoms of  patient  states problem started when she fell& hit her head about a year ago.   ACTIVE PROBLEMS: Vitreous degeneration, bilateral   ICD10: H43.813  ICD9:   Onset: 04/17/2014 11:57  Initial Date:    Age-related nuclear cataract, bilateral   ICD10: H25.13  ICD9:   Onset: 04/17/2014 10:50  Initial Date:    Primary open-angle glaucoma, severe stage   ICD10: H40.11X3  ICD9:   Onset: 04/17/2014 10:01  Initial Date:   SOME DEGREE OF PROGRESSIVE CLOSURE OD STILL UNCONTROLLED OD STABLE OS  SURGERIES: Gall bladder Pick List - Surgeries  MEDICATIONS: Benicar HCT (Olmesartan/HCTZ):   12.5 mg-20 mg tablet  SIG-  1 tab(s)   once a day   Pantoprazole: 40 mg delayed release tablet SIG-  Dose-  Freq-    Coumadin (Warfarin):   5 mg tablet  SIG-   as directed   once a day   Digoxin (Lanoxin, Digitek):   125 mcg (0.125 mg) tablet  SIG-  1 each   once a day   Metoprolol  (Lopressor):   50 mg tablet  SIG-  1 each    2 times a day    Simbrinza: 0.2%-1% suspension SIG-  1 drop in OU TID   Travatan Z: 0.004% solution SIG-  1 gtt in each affected eye once a day (in the evening) for 30 days   Ciloxan (Ciprofloxacin) Solution: 0.3% solution SIG-  1 drop in right eye BID  REVIEW OF SYSTEMS: not foundROS:   GEN- Constitutional: HENT: GEN - Endocrine: Reports symptoms of LUNGS/Respiratory:  HEART/Cardiovascular: Reports symptoms of hypertension.    ABD/Gastrointestinal:  HEMORRIODS    Musculoskeletal  (BJE): NEURO/Neurological: PSYCH/Psychiatric:    Is the pt oriented to time, place, person? YES  Mood normal    TOBACCO:          Smoker Status:   Never smoker   ICD10: Z87.898 ICD9: V13.89 Onset: 04/17/2014 09:55  SOCIAL HISTORY: Starter Pick List - Social History  FAMILY HISTORY:  Family History - 1st Degree Relatives:  Daughter alive and well.  ALLERGIES: Drug Allergies.  No Known.   Starter - Allergies - Summary:  PHYSICAL EXAMINATION: VS: BMI: 28.3.  BP: 161/85.  H: 67.00 in.  P: 65 /min.  W: 180lbs 0oz.    Va     OD cc 20/70 ph 20/60 OS cc 20/40 ph 20/30  EYEGLASSES:  OD +2.50 +0.50 x007 OS +1.75 +0.75 x001 ADD: +2.75  MR  04/17/2014 09:59  OD +2.00 +0.50 x156 20/50 OS +2.00 +0.25 x126 20/30 ADD +2.75  K's OD  43.25 axis  050        43.50 OS  43.25 axis  134         43.50  VF:   OD  FULL IN ALL FOUR QUADRANTS OS  FULL IN ALL FOUR QUADRANTS  Motility FULL  PUPILS: +2MG  OD   EYELIDS & OCULAR ADNEXA NORMAL  SLE: Conjunctiva  pinguecula  OU  Cornea ARCUS   Decreased Tear Break-Up Time OU  AC:  deep and quiet    Iris BROWN  Lens 2+  nuclear  sclerosis  OU  Vitreous posterior vitreous detachment   OU  CCT  Ta   in mmHg    OD  20          OS 14 Time 05/04/2014 16:18   Gonio  UJ:WJXBOD:HIGH IRIS INSERTION, SUPERIOR ANGLE CLOSE TO TM, TEMPORAL AND NASAL TO scleral spur PARTIALLY VISIBLE, INFERIOR ANGLE OPEN TO TM SS ANGLE DEEPENS WITH COMPRESSION OS;TEMPORAL AND NASAL OPEN TO scleral spur,   Dilation  NOT DILATED SECONDARY TO EIOP OD   Fundus:  optic nerve  OD  INFERIOR RIM LOSS DIFFUSE PALLOR 90-95%                                OS INFERIOR RIM LOSS    Macula      OD DULL LIGHT REFLEX                         OS DULL LIGHT REFLEX  Vessels AV CROSSING CHANGES   Periphery  NORMAL    Humphery visual field  SUPERIOR BJERRUM STABLE,  OS NEW Paracentral Scotoma INFERIOR AND SUPERIOR BJERRUM     Exam: GENERAL: Appearance: HEAD, EARS, NOSE  AND THROAT: Ears-Nose (external) Inspection: Externally, nose and ears are normal in appearance and without scars, lesions, or nodules.      Hearing assessment shows no problems with normal conversation.      LUNGS and RESPIRATORY: Lung auscultation elicits no wheezing, rhonci, rales or rubs and with equal breath sounds.    Respiratory effort described as breathing is unlabored and chest movement is symmetrical.    HEART (Cardiovascular): Heart auscultation discovers regular rate and rhythm; no murmur, gallop or rub. Normal heart sounds.    ABDOMEN (Gastrointestinal): Mass/Tenderness Exam: Neither are present.     MUSCULOSKELETAL (BJE): Inspection-Palpation: No major bone, joint, tendon, or muscle changes.      NEUROLOGICAL: Alert and oriented. No major deficits of coordination or sensation.      PSYCHIATRIC: Insight and judgment appear  both to be intact and appropriate.    Mood and affect are described as normal mood and full affect.    SKIN: Skin Inspection: No rashes or lesions  ADMITTING DIAGNOSIS: Vitreous degeneration, bilateral   ICD10: H43.813  ICD9:   Onset: 04/17/2014 11:57  Initial Date:    Age-related nuclear cataract, bilateral   ICD10: H25.13  ICD9:   Onset: 04/17/2014 10:50  Initial Date:    Primary open-angle glaucoma, severe stage   ICD10: H40.11X3  ICD9:   Onset: 04/17/2014 10:01  Initial Date:   SOME DEGREE OF PROGRESSIVE CLOSURE OD STILL UNCONTROLLED OD STABLE OS  SURGICAL TREATMENT PLAN: trabeculectomy WITH  [facility] OD   Risk and benefits of surgery have been reviewed with the patient and the patient agrees to proceed with the surgical procedure.    continue same medications    Target </= 14 OU RECOMMEND  trabeculectomy  W [facility] OD, BECAUSE OF ADVANCE NATURE OF OCULAR VISUAL FIELD LOSS   Actions:     Handouts: glaucoma , what is glaucoma?, glaucoma treatment.    ___________________________ Chalmers Guestoy Camillia Marcy, Montez HagemanJr.  Starter - Inactive Problems:

## 2014-05-16 ENCOUNTER — Encounter (HOSPITAL_COMMUNITY): Payer: Self-pay | Admitting: *Deleted

## 2014-05-16 MED ORDER — GATIFLOXACIN 0.5 % OP SOLN
1.0000 [drp] | OPHTHALMIC | Status: AC | PRN
  Administered 2014-05-17 (×3): 1 [drp] via OPHTHALMIC
  Filled 2014-05-16: qty 2.5

## 2014-05-17 ENCOUNTER — Ambulatory Visit (HOSPITAL_COMMUNITY): Payer: Medicare Other | Admitting: Certified Registered Nurse Anesthetist

## 2014-05-17 ENCOUNTER — Encounter (HOSPITAL_COMMUNITY): Payer: Self-pay | Admitting: Certified Registered Nurse Anesthetist

## 2014-05-17 ENCOUNTER — Ambulatory Visit (HOSPITAL_COMMUNITY)
Admission: RE | Admit: 2014-05-17 | Discharge: 2014-05-17 | Disposition: A | Discharge status: Home / Self Care | Payer: Medicare Other | Source: Ambulatory Visit | Attending: Ophthalmology | Admitting: Ophthalmology

## 2014-05-17 ENCOUNTER — Encounter (HOSPITAL_COMMUNITY)
Admission: RE | Disposition: A | Discharge status: Home / Self Care | Payer: Self-pay | Source: Ambulatory Visit | Attending: Ophthalmology

## 2014-05-17 DIAGNOSIS — I252 Old myocardial infarction: Secondary | ICD-10-CM | POA: Diagnosis not present

## 2014-05-17 DIAGNOSIS — J45909 Unspecified asthma, uncomplicated: Secondary | ICD-10-CM | POA: Diagnosis not present

## 2014-05-17 DIAGNOSIS — Z79899 Other long term (current) drug therapy: Secondary | ICD-10-CM | POA: Diagnosis not present

## 2014-05-17 DIAGNOSIS — I509 Heart failure, unspecified: Secondary | ICD-10-CM | POA: Insufficient documentation

## 2014-05-17 DIAGNOSIS — H2513 Age-related nuclear cataract, bilateral: Secondary | ICD-10-CM | POA: Insufficient documentation

## 2014-05-17 DIAGNOSIS — K219 Gastro-esophageal reflux disease without esophagitis: Secondary | ICD-10-CM | POA: Insufficient documentation

## 2014-05-17 DIAGNOSIS — M1990 Unspecified osteoarthritis, unspecified site: Secondary | ICD-10-CM | POA: Diagnosis not present

## 2014-05-17 DIAGNOSIS — Z792 Long term (current) use of antibiotics: Secondary | ICD-10-CM | POA: Diagnosis not present

## 2014-05-17 DIAGNOSIS — H43813 Vitreous degeneration, bilateral: Secondary | ICD-10-CM | POA: Diagnosis not present

## 2014-05-17 DIAGNOSIS — Z7901 Long term (current) use of anticoagulants: Secondary | ICD-10-CM | POA: Diagnosis not present

## 2014-05-17 DIAGNOSIS — H4011X3 Primary open-angle glaucoma, severe stage: Secondary | ICD-10-CM | POA: Insufficient documentation

## 2014-05-17 DIAGNOSIS — I10 Essential (primary) hypertension: Secondary | ICD-10-CM | POA: Diagnosis not present

## 2014-05-17 DIAGNOSIS — Z87891 Personal history of nicotine dependence: Secondary | ICD-10-CM | POA: Diagnosis not present

## 2014-05-17 DIAGNOSIS — I4891 Unspecified atrial fibrillation: Secondary | ICD-10-CM | POA: Diagnosis not present

## 2014-05-17 HISTORY — DX: Personal history of other medical treatment: Z92.89

## 2014-05-17 HISTORY — PX: TRABECULECTOMY: SHX107

## 2014-05-17 HISTORY — PX: MITOMYCIN C APPLICATION: SHX6375

## 2014-05-17 HISTORY — DX: Proteinuria, unspecified: R80.9

## 2014-05-17 HISTORY — DX: Unspecified hemorrhoids: K64.9

## 2014-05-17 LAB — CBC
HCT: 35.8 % — ABNORMAL LOW (ref 36.0–46.0)
Hemoglobin: 11.7 g/dL — ABNORMAL LOW (ref 12.0–15.0)
MCH: 28.2 pg (ref 26.0–34.0)
MCHC: 32.7 g/dL (ref 30.0–36.0)
MCV: 86.3 fL (ref 78.0–100.0)
Platelets: 201 10*3/uL (ref 150–400)
RBC: 4.15 MIL/uL (ref 3.87–5.11)
RDW: 14.7 % (ref 11.5–15.5)
WBC: 6.9 10*3/uL (ref 4.0–10.5)

## 2014-05-17 LAB — BASIC METABOLIC PANEL
Anion gap: 9 (ref 5–15)
BUN: 18 mg/dL (ref 6–23)
CO2: 24 mmol/L (ref 19–32)
Calcium: 9.7 mg/dL (ref 8.4–10.5)
Chloride: 102 mmol/L (ref 96–112)
Creatinine, Ser: 1.45 mg/dL — ABNORMAL HIGH (ref 0.50–1.10)
GFR calc Af Amer: 39 mL/min — ABNORMAL LOW (ref >90)
GFR calc non Af Amer: 34 mL/min — ABNORMAL LOW (ref >90)
Glucose, Bld: 99 mg/dL (ref 70–99)
Potassium: 3.8 mmol/L (ref 3.5–5.1)
Sodium: 135 mmol/L (ref 135–145)

## 2014-05-17 SURGERY — TRABECULECTOMY
Anesthesia: General | Site: Eye | Laterality: Right

## 2014-05-17 MED ORDER — GLYCOPYRROLATE 0.2 MG/ML IJ SOLN
INTRAMUSCULAR | Status: DC | PRN
  Administered 2014-05-17: 0.2 mg via INTRAVENOUS
  Administered 2014-05-17: 0.4 mg via INTRAVENOUS

## 2014-05-17 MED ORDER — BSS IO SOLN
INTRAOCULAR | Status: DC | PRN
  Administered 2014-05-17: 500 mL

## 2014-05-17 MED ORDER — 0.9 % SODIUM CHLORIDE (POUR BTL) OPTIME
TOPICAL | Status: DC | PRN
  Administered 2014-05-17: 100 mL

## 2014-05-17 MED ORDER — ACETAMINOPHEN 325 MG PO TABS: 650.0000 mg | ORAL_TABLET | ORAL | Status: DC | PRN

## 2014-05-17 MED ORDER — LIDOCAINE HCL 4 % MT SOLN
OROMUCOSAL | Status: DC | PRN
  Administered 2014-05-17: 4 mL via TOPICAL

## 2014-05-17 MED ORDER — FENTANYL CITRATE (PF) 250 MCG/5ML IJ SOLN
INTRAMUSCULAR | Status: AC
  Filled 2014-05-17: qty 5

## 2014-05-17 MED ORDER — LIDOCAINE HCL 2 % IJ SOLN
INTRAMUSCULAR | Status: AC
  Filled 2014-05-17: qty 20

## 2014-05-17 MED ORDER — GLYCOPYRROLATE 0.2 MG/ML IJ SOLN
INTRAMUSCULAR | Status: AC
  Filled 2014-05-17: qty 3

## 2014-05-17 MED ORDER — LIDOCAINE HCL (CARDIAC) 20 MG/ML IV SOLN
INTRAVENOUS | Status: DC | PRN
  Administered 2014-05-17: 80 mg via INTRAVENOUS

## 2014-05-17 MED ORDER — NEOSTIGMINE METHYLSULFATE 10 MG/10ML IV SOLN
INTRAVENOUS | Status: AC
  Filled 2014-05-17: qty 3

## 2014-05-17 MED ORDER — ONDANSETRON HCL 4 MG/2ML IJ SOLN
INTRAMUSCULAR | Status: DC | PRN
  Administered 2014-05-17: 4 mg via INTRAVENOUS

## 2014-05-17 MED ORDER — FLUORESCEIN SODIUM 1 MG OP STRP
ORAL_STRIP | OPHTHALMIC | Status: DC | PRN
  Administered 2014-05-17: 1 via OPHTHALMIC

## 2014-05-17 MED ORDER — MITOMYCIN 0.2 MG OP KIT
PACK | OPHTHALMIC | Status: DC | PRN
  Administered 2014-05-17: .4 mg via OPHTHALMIC

## 2014-05-17 MED ORDER — OXYCODONE HCL 5 MG PO TABS: 5.0000 mg | ORAL_TABLET | Freq: Once | ORAL | Status: DC | PRN

## 2014-05-17 MED ORDER — ACETYLCHOLINE CHLORIDE 20 MG IO SOLR
INTRAOCULAR | Status: AC
  Filled 2014-05-17: qty 1

## 2014-05-17 MED ORDER — BUPIVACAINE HCL (PF) 0.75 % IJ SOLN
INTRAMUSCULAR | Status: AC
  Filled 2014-05-17: qty 10

## 2014-05-17 MED ORDER — GLYCOPYRROLATE 0.2 MG/ML IJ SOLN
INTRAMUSCULAR | Status: AC
  Filled 2014-05-17: qty 1

## 2014-05-17 MED ORDER — TETRACAINE HCL 0.5 % OP SOLN
OPHTHALMIC | Status: AC
  Filled 2014-05-17: qty 2

## 2014-05-17 MED ORDER — BSS IO SOLN
INTRAOCULAR | Status: AC
  Filled 2014-05-17: qty 15

## 2014-05-17 MED ORDER — TOBRAMYCIN-DEXAMETHASONE 0.3-0.1 % OP OINT
TOPICAL_OINTMENT | OPHTHALMIC | Status: AC
  Filled 2014-05-17: qty 3.5

## 2014-05-17 MED ORDER — PROPOFOL 10 MG/ML IV BOLUS
INTRAVENOUS | Status: AC
  Filled 2014-05-17: qty 20

## 2014-05-17 MED ORDER — OXYCODONE HCL 5 MG/5ML PO SOLN: 5.0000 mg | Freq: Once | ORAL | Status: DC | PRN

## 2014-05-17 MED ORDER — TRIAMCINOLONE ACETONIDE 40 MG/ML IJ SUSP
INTRAMUSCULAR | Status: AC
  Filled 2014-05-17: qty 5

## 2014-05-17 MED ORDER — ATROPINE SULFATE 1 % OP SOLN
OPHTHALMIC | Status: AC
  Filled 2014-05-17: qty 5

## 2014-05-17 MED ORDER — ROCURONIUM BROMIDE 50 MG/5ML IV SOLN
INTRAVENOUS | Status: AC
  Filled 2014-05-17: qty 1

## 2014-05-17 MED ORDER — EPINEPHRINE HCL 1 MG/ML IJ SOLN
INTRAMUSCULAR | Status: AC
  Filled 2014-05-17: qty 1

## 2014-05-17 MED ORDER — SODIUM CHLORIDE 0.9 % IV SOLN
INTRAVENOUS | Status: DC | PRN
  Administered 2014-05-17 (×2): via INTRAVENOUS

## 2014-05-17 MED ORDER — HYALURONIDASE HUMAN 150 UNIT/ML IJ SOLN
INTRAMUSCULAR | Status: AC
  Filled 2014-05-17: qty 1

## 2014-05-17 MED ORDER — SUCCINYLCHOLINE CHLORIDE 20 MG/ML IJ SOLN
INTRAMUSCULAR | Status: AC
  Filled 2014-05-17: qty 1

## 2014-05-17 MED ORDER — ACETAMINOPHEN 10 MG/ML IV SOLN
INTRAVENOUS | Status: AC
  Filled 2014-05-17: qty 100

## 2014-05-17 MED ORDER — ROCURONIUM BROMIDE 100 MG/10ML IV SOLN
INTRAVENOUS | Status: DC | PRN
  Administered 2014-05-17: 30 mg via INTRAVENOUS

## 2014-05-17 MED ORDER — PROPOFOL 10 MG/ML IV BOLUS
INTRAVENOUS | Status: DC | PRN
  Administered 2014-05-17 (×2): 20 mg via INTRAVENOUS
  Administered 2014-05-17: 40 mg via INTRAVENOUS
  Administered 2014-05-17: 20 mg via INTRAVENOUS

## 2014-05-17 MED ORDER — SODIUM HYALURONATE 10 MG/ML IO SOLN
INTRAOCULAR | Status: AC
  Filled 2014-05-17: qty 0.85

## 2014-05-17 MED ORDER — FENTANYL CITRATE (PF) 100 MCG/2ML IJ SOLN
INTRAMUSCULAR | Status: DC | PRN
  Administered 2014-05-17: 100 ug via INTRAVENOUS

## 2014-05-17 MED ORDER — ONDANSETRON HCL 4 MG/2ML IJ SOLN
INTRAMUSCULAR | Status: AC
  Filled 2014-05-17: qty 2

## 2014-05-17 MED ORDER — BSS IO SOLN
INTRAOCULAR | Status: DC | PRN
  Administered 2014-05-17: 15 mL via INTRAOCULAR

## 2014-05-17 MED ORDER — STERILE WATER FOR INJECTION IJ SOLN
INTRAMUSCULAR | Status: AC
  Filled 2014-05-17: qty 10

## 2014-05-17 MED ORDER — MITOMYCIN-C INJECTION USE IN OR ONLY (0.4 MG/ML)
0.5000 mL | Freq: Once | INTRAVENOUS | Status: DC
  Filled 2014-05-17: qty 0.5

## 2014-05-17 MED ORDER — PHENYLEPHRINE HCL 10 MG/ML IJ SOLN
10.0000 mg | INTRAVENOUS | Status: DC | PRN
  Administered 2014-05-17: 20 ug/min via INTRAVENOUS

## 2014-05-17 MED ORDER — EPHEDRINE SULFATE 50 MG/ML IJ SOLN
INTRAMUSCULAR | Status: AC
  Filled 2014-05-17: qty 1

## 2014-05-17 MED ORDER — FENTANYL CITRATE (PF) 100 MCG/2ML IJ SOLN: 25.0000 ug | INTRAMUSCULAR | Status: DC | PRN

## 2014-05-17 MED ORDER — SODIUM HYALURONATE 10 MG/ML IO SOLN
INTRAOCULAR | Status: DC | PRN
  Administered 2014-05-17: 0.85 mL via INTRAOCULAR

## 2014-05-17 MED ORDER — TRIAMCINOLONE ACETONIDE 40 MG/ML IJ SUSP
INTRAMUSCULAR | Status: DC | PRN
  Administered 2014-05-17: .1 mL

## 2014-05-17 MED ORDER — ACETAMINOPHEN 10 MG/ML IV SOLN
INTRAVENOUS | Status: DC | PRN
  Administered 2014-05-17: 1000 mg via INTRAVENOUS

## 2014-05-17 MED ORDER — ATROPINE SULFATE 1 % OP SOLN
OPHTHALMIC | Status: DC | PRN
Start: 2014-05-17 — End: 2014-05-17
  Administered 2014-05-17: 1 [drp] via OPHTHALMIC

## 2014-05-17 MED ORDER — PILOCARPINE HCL 4 % OP SOLN
OPHTHALMIC | Status: AC
  Filled 2014-05-17: qty 15

## 2014-05-17 MED ORDER — TOBRAMYCIN 0.3 % OP OINT
TOPICAL_OINTMENT | OPHTHALMIC | Status: DC | PRN
  Administered 2014-05-17: 1 via OPHTHALMIC

## 2014-05-17 MED ORDER — FLUORESCEIN SODIUM 1 MG OP STRP
ORAL_STRIP | OPHTHALMIC | Status: AC
  Filled 2014-05-17: qty 1

## 2014-05-17 MED ORDER — BSS IO SOLN
INTRAOCULAR | Status: AC
  Filled 2014-05-17: qty 500

## 2014-05-17 MED ORDER — LIDOCAINE HCL (CARDIAC) 20 MG/ML IV SOLN
INTRAVENOUS | Status: AC
  Filled 2014-05-17: qty 5

## 2014-05-17 MED ORDER — LIDOCAINE-EPINEPHRINE 2 %-1:100000 IJ SOLN
INTRAMUSCULAR | Status: AC
  Filled 2014-05-17: qty 1

## 2014-05-17 MED ORDER — NEOSTIGMINE METHYLSULFATE 10 MG/10ML IV SOLN
INTRAVENOUS | Status: DC | PRN
Start: 2014-05-17 — End: 2014-05-17
  Administered 2014-05-17: 3 mg via INTRAVENOUS

## 2014-05-17 SURGICAL SUPPLY — 58 items
APL SRG 3 HI ABS STRL LF PLS (MISCELLANEOUS) ×1
APPLICATOR COTTON TIP 6IN STRL (MISCELLANEOUS) ×3 IMPLANT
APPLICATOR DR MATTHEWS STRL (MISCELLANEOUS) ×2 IMPLANT
BLADE 10 SAFETY STRL DISP (BLADE) ×2 IMPLANT
BLADE EYE CATARACT 19 1.4 BEAV (BLADE) ×2 IMPLANT
BLADE MINI RND TIP GREEN BEAV (BLADE) IMPLANT
BLADE STAB KNIFE 45DEG (BLADE) ×2 IMPLANT
CANISTER SUCTION 2500CC (MISCELLANEOUS) IMPLANT
CANNULA ANT/CHMB 27G (MISCELLANEOUS) ×1 IMPLANT
CANNULA ANT/CHMB 27GA (MISCELLANEOUS) ×2 IMPLANT
CORDS BIPOLAR (ELECTRODE) ×2 IMPLANT
COVER MAYO STAND STRL (DRAPES) IMPLANT
DRAPE OPHTHALMIC 40X48 W POUCH (DRAPES) ×2 IMPLANT
DRAPE RETRACTOR (MISCELLANEOUS) ×2 IMPLANT
ERASER HMR WETFIELD 23G BP (MISCELLANEOUS) ×1 IMPLANT
GLOVE BIO SURGEON STRL SZ7 (GLOVE) ×1 IMPLANT
GLOVE BIO SURGEON STRL SZ8 (GLOVE) ×2 IMPLANT
GLOVE BIOGEL PI IND STRL 7.0 (GLOVE) IMPLANT
GLOVE BIOGEL PI IND STRL 7.5 (GLOVE) IMPLANT
GLOVE BIOGEL PI INDICATOR 7.0 (GLOVE) ×2
GLOVE BIOGEL PI INDICATOR 7.5 (GLOVE) ×1
GLOVE ECLIPSE 7.0 STRL STRAW (GLOVE) ×2 IMPLANT
GLOVE SURG SS PI 7.0 STRL IVOR (GLOVE) ×2 IMPLANT
GOWN STRL REIN XL XLG (GOWN DISPOSABLE) ×6 IMPLANT
GOWN STRL REUS W/ TWL LRG LVL3 (GOWN DISPOSABLE) ×2 IMPLANT
GOWN STRL REUS W/ TWL XL LVL3 (GOWN DISPOSABLE) ×1 IMPLANT
GOWN STRL REUS W/TWL LRG LVL3 (GOWN DISPOSABLE) ×4
GOWN STRL REUS W/TWL XL LVL3 (GOWN DISPOSABLE) ×2
KIT BASIN OR (CUSTOM PROCEDURE TRAY) ×2 IMPLANT
KIT ROOM TURNOVER OR (KITS) ×2 IMPLANT
KNIFE GRIESHABER SHARP 2.5MM (MISCELLANEOUS) ×2 IMPLANT
NDL 25GX 5/8IN NON SAFETY (NEEDLE) ×1 IMPLANT
NDL FILTER BLUNT 18X1 1/2 (NEEDLE) ×1 IMPLANT
NDL HYPO 30X.5 LL (NEEDLE) ×1 IMPLANT
NEEDLE 25GX 5/8IN NON SAFETY (NEEDLE) ×2 IMPLANT
NEEDLE FILTER BLUNT 18X 1/2SAF (NEEDLE) ×1
NEEDLE FILTER BLUNT 18X1 1/2 (NEEDLE) ×1 IMPLANT
NEEDLE HYPO 30X.5 LL (NEEDLE) ×2 IMPLANT
NS IRRIG 1000ML POUR BTL (IV SOLUTION) ×2 IMPLANT
PACK CATARACT CUSTOM (CUSTOM PROCEDURE TRAY) ×2 IMPLANT
PAD ARMBOARD 7.5X6 YLW CONV (MISCELLANEOUS) ×4 IMPLANT
PAK PIK CVS CATARACT (OPHTHALMIC) ×1 IMPLANT
SPEAR EYE SURG WECK-CEL (MISCELLANEOUS) ×1 IMPLANT
SUT ETHILON 10 0 CS140 6 (SUTURE) ×2 IMPLANT
SUT ETHILON 9 0 BV100 4 (SUTURE) ×2 IMPLANT
SUT SILK 6 0 G 6 (SUTURE) ×2 IMPLANT
SUT VICRYL 9-0 (SUTURE) ×1 IMPLANT
SYR 20CC LL (SYRINGE) ×4 IMPLANT
SYR 50ML SLIP (SYRINGE) ×2 IMPLANT
SYR TB 1ML LUER SLIP (SYRINGE) IMPLANT
TAPE SURG TRANSPORE 1 IN (GAUZE/BANDAGES/DRESSINGS) IMPLANT
TAPE SURGICAL TRANSPORE 1 IN (GAUZE/BANDAGES/DRESSINGS) ×1
TIP ABS 45DEG FLARED 0.9MM (TIP) ×2 IMPLANT
TOWEL OR 17X24 6PK STRL BLUE (TOWEL DISPOSABLE) ×4 IMPLANT
TOWEL OR 17X26 10 PK STRL BLUE (TOWEL DISPOSABLE) ×2 IMPLANT
TUBE CONNECTING 12X1/4 (SUCTIONS) ×1 IMPLANT
WATER STERILE IRR 1000ML POUR (IV SOLUTION) ×2 IMPLANT
WIPE INSTRUMENT VISIWIPE 73X73 (MISCELLANEOUS) ×4 IMPLANT

## 2014-05-17 NOTE — Interval H&P Note (Signed)
History and Physical Interval Note:  05/17/2014 8:25 AM  [name]  has presented today for surgery, with the diagnosis of PRIMARY OPEN ANGLE GLAUCOMA SEVERE STAGE  The various methods of treatment have been discussed with the patient and family. After consideration of risks, benefits and other options for treatment, the patient has consented to  Procedure(s): TRABECULECTOMY WITH MITOMYCIN RIGHT EYE (Right) MITOMYCIN C APPLICATION (Right) as a surgical intervention .  The patient's history has been reviewed, patient examined, no change in status, stable for surgery.  I have reviewed the patient's chart and labs.  Questions were answered to the patient's satisfaction.     [name]

## 2014-05-17 NOTE — Anesthesia Procedure Notes (Signed)
Procedure Name: Intubation Date/Time: 05/17/2014 9:22 AM Performed by: Garrison Columbus T Pre-anesthesia Checklist: Patient identified, Emergency Drugs available, Suction available and Patient being monitored Patient Re-evaluated:Patient Re-evaluated prior to inductionOxygen Delivery Method: Circle system utilized Preoxygenation: Pre-oxygenation with 100% oxygen Intubation Type: IV induction Ventilation: Mask ventilation without difficulty Laryngoscope Size: Miller and 2 Grade View: Grade I Tube type: Oral Tube size: 7.5 mm Number of attempts: 1 Airway Equipment and Method: Stylet and LTA kit utilized Placement Confirmation: ETT inserted through vocal cords under direct vision,  positive ETCO2 and breath sounds checked- equal and bilateral Secured at: 22 cm Tube secured with: Tape Dental Injury: Teeth and Oropharynx as per pre-operative assessment

## 2014-05-17 NOTE — H&P (View-Only) (Signed)
                  History & Physical:   DATE:   05-04-14  NAME:  [name], [name]     [id number]       HISTORY OF PRESENT ILLNESS: Dr Lyles [facility]    Chief Eye Complaints glaucoma   Patient states vision is better in OS than OD. Dr Lyles has tried patient on beta blocker and prostaglandin's and Cosopt in which none has help her IOP. Patient is now on Travatan Z at night. No pain in OU . Patient was diagnosed with mixed mechanism glaucoma OU.   HPI: EYES: Reports symptoms of  patient  states problem started when she fell& hit her head about a year ago.   ACTIVE PROBLEMS: Vitreous degeneration, bilateral   ICD10: H43.813  ICD9:   Onset: 04/17/2014 11:57  Initial Date:    Age-related nuclear cataract, bilateral   ICD10: H25.13  ICD9:   Onset: 04/17/2014 10:50  Initial Date:    Primary open-angle glaucoma, severe stage   ICD10: H40.11X3  ICD9:   Onset: 04/17/2014 10:01  Initial Date:   SOME DEGREE OF PROGRESSIVE CLOSURE OD STILL UNCONTROLLED OD STABLE OS  SURGERIES: Gall bladder Pick List - Surgeries  MEDICATIONS: Benicar HCT (Olmesartan/HCTZ):   12.5 mg-20 mg tablet  SIG-  1 tab(s)   once a day   Pantoprazole: 40 mg delayed release tablet SIG-  Dose-  Freq-    Coumadin (Warfarin):   5 mg tablet  SIG-   as directed   once a day   Digoxin (Lanoxin, Digitek):   125 mcg (0.125 mg) tablet  SIG-  1 each   once a day   Metoprolol  (Lopressor):   50 mg tablet  SIG-  1 each    2 times a day    Simbrinza: 0.2%-1% suspension SIG-  1 drop in OU TID   Travatan Z: 0.004% solution SIG-  1 gtt in each affected eye once a day (in the evening) for 30 days   Ciloxan (Ciprofloxacin) Solution: 0.3% solution SIG-  1 drop in right eye BID  REVIEW OF SYSTEMS: not foundROS:   GEN- Constitutional: HENT: GEN - Endocrine: Reports symptoms of LUNGS/Respiratory:  HEART/Cardiovascular: Reports symptoms of hypertension.    ABD/Gastrointestinal:  HEMORRIODS    Musculoskeletal  (BJE): NEURO/Neurological: PSYCH/Psychiatric:    Is the pt oriented to time, place, person? YES  Mood normal    TOBACCO:          Smoker Status:   Never smoker   ICD10: Z87.898 ICD9: V13.89 Onset: 04/17/2014 09:55  SOCIAL HISTORY: Starter Pick List - Social History  FAMILY HISTORY:  Family History - 1st Degree Relatives:  Daughter alive and well.  ALLERGIES: Drug Allergies.  No Known.   Starter - Allergies - Summary:  PHYSICAL EXAMINATION: VS: BMI: 28.3.  BP: 161/85.  H: 67.00 in.  P: 65 /min.  W: 180lbs 0oz.    Va     OD cc 20/70 ph 20/60 OS cc 20/40 ph 20/30  EYEGLASSES:  OD +2.50 +0.50 x007 OS +1.75 +0.75 x001 ADD: +2.75  MR  04/17/2014 09:59  OD +2.00 +0.50 x156 20/50 OS +2.00 +0.25 x126 20/30 ADD +2.75  K's OD  43.25 axis  050        43.50 OS  43.25 axis  134         43.50  VF:   OD  FULL IN ALL FOUR QUADRANTS OS    FULL IN ALL FOUR QUADRANTS  Motility FULL  PUPILS: +2MG OD   EYELIDS & OCULAR ADNEXA NORMAL  SLE: Conjunctiva  pinguecula  OU  Cornea ARCUS   Decreased Tear Break-Up Time OU  AC:  deep and quiet    Iris BROWN  Lens 2+  nuclear  sclerosis  OU  Vitreous posterior vitreous detachment   OU  CCT  Ta   in mmHg    OD  20          OS 14 Time 05/04/2014 16:18   Gonio  OD:HIGH IRIS INSERTION, SUPERIOR ANGLE CLOSE TO TM, TEMPORAL AND NASAL TO scleral spur PARTIALLY VISIBLE, INFERIOR ANGLE OPEN TO TM SS ANGLE DEEPENS WITH COMPRESSION OS;TEMPORAL AND NASAL OPEN TO scleral spur,   Dilation  NOT DILATED SECONDARY TO EIOP OD   Fundus:  optic nerve  OD  INFERIOR RIM LOSS DIFFUSE PALLOR 90-95%                                OS INFERIOR RIM LOSS    Macula      OD DULL LIGHT REFLEX                         OS DULL LIGHT REFLEX  Vessels AV CROSSING CHANGES   Periphery  NORMAL    Humphery visual field  SUPERIOR BJERRUM STABLE,  OS NEW Paracentral Scotoma INFERIOR AND SUPERIOR BJERRUM     Exam: GENERAL: Appearance: HEAD, EARS, NOSE  AND THROAT: Ears-Nose (external) Inspection: Externally, nose and ears are normal in appearance and without scars, lesions, or nodules.      Hearing assessment shows no problems with normal conversation.      LUNGS and RESPIRATORY: Lung auscultation elicits no wheezing, rhonci, rales or rubs and with equal breath sounds.    Respiratory effort described as breathing is unlabored and chest movement is symmetrical.    HEART (Cardiovascular): Heart auscultation discovers regular rate and rhythm; no murmur, gallop or rub. Normal heart sounds.    ABDOMEN (Gastrointestinal): Mass/Tenderness Exam: Neither are present.     MUSCULOSKELETAL (BJE): Inspection-Palpation: No major bone, joint, tendon, or muscle changes.      NEUROLOGICAL: Alert and oriented. No major deficits of coordination or sensation.      PSYCHIATRIC: Insight and judgment appear  both to be intact and appropriate.    Mood and affect are described as normal mood and full affect.    SKIN: Skin Inspection: No rashes or lesions  ADMITTING DIAGNOSIS: Vitreous degeneration, bilateral   ICD10: H43.813  ICD9:   Onset: 04/17/2014 11:57  Initial Date:    Age-related nuclear cataract, bilateral   ICD10: H25.13  ICD9:   Onset: 04/17/2014 10:50  Initial Date:    Primary open-angle glaucoma, severe stage   ICD10: H40.11X3  ICD9:   Onset: 04/17/2014 10:01  Initial Date:   SOME DEGREE OF PROGRESSIVE CLOSURE OD STILL UNCONTROLLED OD STABLE OS  SURGICAL TREATMENT PLAN: trabeculectomy WITH  [facility] OD   Risk and benefits of surgery have been reviewed with the patient and the patient agrees to proceed with the surgical procedure.    continue same medications    Target </= 14 OU RECOMMEND  trabeculectomy  W [facility] OD, BECAUSE OF ADVANCE NATURE OF OCULAR VISUAL FIELD LOSS   Actions:     Handouts: glaucoma , what is glaucoma?, glaucoma treatment.    ___________________________ [name], Jr.   Starter - Inactive Problems:  

## 2014-05-17 NOTE — Progress Notes (Addendum)
Spoke to Dr. Harlon FlorWhitaker re; patient on coumadin which she was to continue and Dr. Harlon FlorWhitaker stated her INR from 05/08/14 2.6 was fine , we did not need to repeat.    Repeat bp 217/109.   Notified Dr. Maple HudsonMoser of bp 217/94- 217/109.

## 2014-05-17 NOTE — Op Note (Signed)
Preoperative diagnosis: Uncontrolled mixed mechanism glaucoma right eye Postop diagnosis: Same Anesthesia: Gen. Combinations: None Assistant: Higinio RogerMing Lee Procedure: The patient is transported to the operating room and after induction of general anesthesia the patient's face is prepped and draped in the usual sterile fashion. With the surgeon sitting at 12:00 position and the operating microscope in position a muscle hook was used to infraducted the eye and a 6-0 nylon suture was passed through clear cornea to infraducted the eye. Using a Hoskins forceps and a Vannas scissor an incision was made at the superior nasal limbus following this using the blunt Wescott scissors dissection was carried posteriorly forming a fornix-based conjunctival flap the Tooke blade was used to recess T9 fibers and bleeding was controlled with cautery. 45 blade was used to fashion a partial-thickness scleral flap with the base of 4 mm using the Colibri forceps and the grease Hopper blade dissection was made to the corneal scleral limbus. Mitomycin-C 0.4 mg/mL was placed on 3 Gelfoam sponges and allowed to stay under the conjunctiva for 3 minutes the sponges were then removed and the eye was irrigated with 40 mL of balanced salt solution. Following this a paracentesis tract was made through clear cornea at the 8:30 position and a small amount of Provisc was injected to maintain the anterior chamber. Left flap was then elevated and a 45 blade was passed into the chamber with egressive fluid from the eye a Descemet's punch was then used to remove a 1 x 3 block of tissue iris prolapse was noted at this point and a basilar peripheral iridectomy was formed using the Vannas scissors and the Chandler forceps. At this point to interrupted releasable sutures were placed passing the sutures through sclera side clear cornea cornea back through the scleral flap and securing each end of the flap tightening and adjusting the chamber fluid was  irrigated and there was only a small egressive fluid the anterior chamber remained formed at all times. The conjunctiva was then closed with a 9-0 Vicryl suture on a BV 100 needle. Balanced salt solution was injected through the paracentesis tract the bleb elevated the incision was Seidel negative with fluorescin staining. A subconjunctival injection of Kenalog 4 mg was given in the inferior temporal subconjunctival space. Topical atropine drops and topical Tobradex ointment were applied to the eye a patch and Fox U were placed and the patient returned to recovery area in stable condition. Chalmers Guestoy Elston Aldape Junior M.D.

## 2014-05-17 NOTE — Discharge Instructions (Signed)
The patient should leave the eye patch on her eye do not remove the patch. The patient should sleep on her back or left side of 4 at heavy lifting bending or straining. The patient should continue eyedrops left eye only. The patient should take her usual pain medication if the pain is not relieved the patient may call the office.

## 2014-05-17 NOTE — Anesthesia Postprocedure Evaluation (Signed)
  Anesthesia Post-op Note  Patient: [name]  Procedure(s) Performed: Procedure(s): TRABECULECTOMY WITH MITOMYCIN RIGHT EYE (Right) MITOMYCIN C APPLICATION (Right)  Patient Location: PACU  Anesthesia Type:General  Level of Consciousness: awake  Airway and Oxygen Therapy: Patient Spontanous Breathing  Post-op Pain: none  Post-op Assessment: Post-op Vital signs reviewed, Patient's Cardiovascular Status Stable, Respiratory Function Stable, Patent Airway, No signs of Nausea or vomiting and Pain level controlled  Post-op Vital Signs: Reviewed and stable  Last Vitals:  Filed Vitals:   05/17/14 1230  BP: 171/100  Pulse: 81  Temp:   Resp:     Complications: No apparent anesthesia complications

## 2014-05-17 NOTE — Transfer of Care (Signed)
Immediate Anesthesia Transfer of Care Note  Patient: [name]  Procedure(s) Performed: Procedure(s): TRABECULECTOMY WITH MITOMYCIN RIGHT EYE (Right) MITOMYCIN C APPLICATION (Right)  Patient Location: PACU  Anesthesia Type:General  Level of Consciousness: awake, alert  and oriented  Airway & Oxygen Therapy: Patient Spontanous Breathing and Patient connected to nasal cannula oxygen  Post-op Assessment: Report given to RN, Post -op Vital signs reviewed and stable and Patient moving all extremities X 4  Post vital signs: Reviewed and stable  Last Vitals:  Filed Vitals:   05/17/14 0633  BP: 217/94  Pulse: 64  Temp: 36.7 C  Resp: 20    Complications: No apparent anesthesia complications

## 2014-05-17 NOTE — Anesthesia Preprocedure Evaluation (Addendum)
Anesthesia Evaluation  Patient identified by MRN, date of birth, ID band Patient awake    Reviewed: Allergy & Precautions, NPO status , Patient's Chart, lab work & pertinent test results  History of Anesthesia Complications Negative for: history of anesthetic complications  Airway Mallampati: II  TM Distance: >3 FB Neck ROM: Full    Dental  (+) Dental Advisory Given, Partial Lower, Partial Upper,    Pulmonary asthma , former smoker,  breath sounds clear to auscultation        Cardiovascular hypertension, Pt. on medications and Pt. on home beta blockers - angina+CHF - Past MI + dysrhythmias Atrial Fibrillation and Ventricular Tachycardia + Valvular Problems/Murmurs Rhythm:Irregular  Poorly controlled HTN   Neuro/Psych negative neurological ROS  negative psych ROS   GI/Hepatic Neg liver ROS, GERD-  Medicated and Controlled,  Endo/Other    Renal/GU      Musculoskeletal  (+) Arthritis -,   Abdominal   Peds  Hematology  (+) anemia ,   Anesthesia Other Findings   Reproductive/Obstetrics                         Anesthesia Physical Anesthesia Plan  ASA: III  Anesthesia Plan: General   Post-op Pain Management:    Induction: Intravenous  Airway Management Planned: Oral ETT  Additional Equipment: None  Intra-op Plan:   Post-operative Plan: Extubation in OR  Informed Consent: I have reviewed the patients History and Physical, chart, labs and discussed the procedure including the risks, benefits and alternatives for the proposed anesthesia with the patient or authorized representative who has indicated his/her understanding and acceptance.   Dental advisory given  Plan Discussed with: Anesthesiologist, Surgeon and CRNA  Anesthesia Plan Comments:       Anesthesia Quick Evaluation

## 2014-05-18 ENCOUNTER — Encounter (HOSPITAL_COMMUNITY): Payer: Self-pay | Admitting: Ophthalmology

## 2014-06-05 ENCOUNTER — Ambulatory Visit (INDEPENDENT_AMBULATORY_CARE_PROVIDER_SITE_OTHER): Payer: Medicare Other

## 2014-06-05 DIAGNOSIS — Z952 Presence of prosthetic heart valve: Secondary | ICD-10-CM

## 2014-06-05 DIAGNOSIS — I4891 Unspecified atrial fibrillation: Secondary | ICD-10-CM

## 2014-06-05 DIAGNOSIS — Z5181 Encounter for therapeutic drug level monitoring: Secondary | ICD-10-CM

## 2014-06-05 DIAGNOSIS — Z954 Presence of other heart-valve replacement: Secondary | ICD-10-CM | POA: Diagnosis not present

## 2014-06-05 DIAGNOSIS — Z7901 Long term (current) use of anticoagulants: Secondary | ICD-10-CM

## 2014-06-05 LAB — POCT INR: INR: 3.3

## 2014-07-03 ENCOUNTER — Ambulatory Visit (INDEPENDENT_AMBULATORY_CARE_PROVIDER_SITE_OTHER): Payer: Medicare Other | Admitting: *Deleted

## 2014-07-03 DIAGNOSIS — Z952 Presence of prosthetic heart valve: Secondary | ICD-10-CM

## 2014-07-03 DIAGNOSIS — I4891 Unspecified atrial fibrillation: Secondary | ICD-10-CM | POA: Diagnosis not present

## 2014-07-03 DIAGNOSIS — Z7901 Long term (current) use of anticoagulants: Secondary | ICD-10-CM

## 2014-07-03 DIAGNOSIS — Z954 Presence of other heart-valve replacement: Secondary | ICD-10-CM

## 2014-07-03 DIAGNOSIS — Z5181 Encounter for therapeutic drug level monitoring: Secondary | ICD-10-CM

## 2014-07-03 LAB — POCT INR: INR: 2.3

## 2014-07-10 ENCOUNTER — Emergency Department (INDEPENDENT_AMBULATORY_CARE_PROVIDER_SITE_OTHER)
Admission: EM | Admit: 2014-07-10 | Discharge: 2014-07-10 | Disposition: A | Discharge status: Home / Self Care | Payer: Medicare Other | Source: Home / Self Care | Attending: Emergency Medicine | Admitting: Emergency Medicine

## 2014-07-10 ENCOUNTER — Encounter (HOSPITAL_COMMUNITY): Payer: Self-pay | Admitting: Emergency Medicine

## 2014-07-10 DIAGNOSIS — N390 Urinary tract infection, site not specified: Secondary | ICD-10-CM

## 2014-07-10 LAB — POCT URINALYSIS DIP (DEVICE)
Bilirubin Urine: NEGATIVE
Glucose, UA: NEGATIVE mg/dL
Ketones, ur: NEGATIVE mg/dL
Nitrite: NEGATIVE
Protein, ur: 100 mg/dL — AB
Specific Gravity, Urine: >=1.03 (ref 1.005–1.030)
Urobilinogen, UA: 0.2 mg/dL (ref 0.0–1.0)
pH: 5.5 (ref 5.0–8.0)

## 2014-07-10 NOTE — Discharge Instructions (Signed)
Please start taking the Cipro that was prescribed by your primary care doctor. Take 1 pill twice a day until they are all gone. You should see improvement in the next 1-2 days. Please make an appointment to get your INR checked this week. Follow-up as needed.

## 2014-07-10 NOTE — ED Provider Notes (Signed)
CSN: [id number]     Arrival date & time 07/10/14  1302 History   First MD Initiated Contact with Patient 07/10/14 1315     Chief Complaint  Patient presents with  . Urinary Tract Infection   (Consider location/radiation/quality/duration/timing/severity/associated sxs/prior Treatment) HPI  She is a 79 year old woman here with her daughter for evaluation of dysuria. She states about 2 weeks ago she was having suprapubic pressure with urination. She saw her primary care doctor at that time and was tested for a urinary tract infection. She was started on Cipro, while the culture was pending. She states she took one Cipro tablet, and then was notified that the urine culture was negative. In the last week, she has developed dysuria, urinary frequency, and urinary urgency. She reports feeling intermittently nauseated. No vomiting or fevers.  Past Medical History  Diagnosis Date  . Hypertension   . GI bleed   . Chronic atrial fibrillation   . Dilated cardiomyopathy     history of this, now resolved  . GERD (gastroesophageal reflux disease)   . Dysphagia     Hx  . Nonsustained ventricular tachycardia   . Osteoporosis   . Microcytic anemia   . Arrhythmia     chronic afib NSVT  . RBBB (right bundle branch block)   . Heart murmur   . Asthma     years ago  . History of blood transfusion   . Protein in urine   . Hemorrhoid 04/2014    bleeding   Past Surgical History  Procedure Laterality Date  . Cholecystectomy    . Mitral valve prosthesis with angioplasty  1987  . Tubal ligation    . Transthoracic echocardiogram  02/2008, 03/2006, 06/2004  . Polyp removal    . Colonoscopy    . Trabeculectomy Right 05/17/2014    Procedure: TRABECULECTOMY WITH MITOMYCIN RIGHT EYE;  Surgeon: Chalmers Guest, MD;  Location: [facility] OR;  Service: Ophthalmology;  Laterality: Right;  . Mitomycin c application Right 05/17/2014    Procedure: MITOMYCIN C APPLICATION;  Surgeon: Chalmers Guest, MD;  Location: [facility] OR;  Service:  Ophthalmology;  Laterality: Right;   Family History  Problem Relation Age of Onset  . Hypertension Mother   . Colon cancer Neg Hx   . Cancer Sister     in the Bladder   History  Substance Use Topics  . Smoking status: Former Games developer  . Smokeless tobacco: Not on file     Comment: quiit 1987  . Alcohol Use: No   OB History    No data available     Review of Systems As in history of present illness Allergies  Esomeprazole magnesium  Home Medications   Prior to Admission medications   Medication Sig Start Date End Date Taking? Authorizing Provider  amoxicillin (AMOXIL) 500 MG capsule TAKE 4 CAPSULES BY MOUTH 1 HOUR PRIOR TO DENTAL WORK 04/27/14   Marinus Maw, MD  BENICAR HCT 20-12.5 MG per tablet Take 1 tablet by mouth daily. 10/20/12   Historical Provider, MD  Brinzolamide-Brimonidine 1-0.2 % SUSP Place 1 drop into both eyes 3 (three) times daily.    Historical Provider, MD  Calcium Carbonate-Vitamin D (CALCIUM + D PO) Take by mouth daily.    Historical Provider, MD  CARTIA XT 180 MG 24 hr capsule TAKE ONE CAPSULE BY MOUTH DAILY 03/10/14   Marinus Maw, MD  Cholecalciferol (VITAMIN D) 2000 UNITS tablet Take 2,000 Units by mouth daily.    Historical Provider, MD  ciprofloxacin (  CILOXAN) 0.3 % ophthalmic ointment Place 1 drop into the right eye 2 (two) times daily.    Historical Provider, MD  DIGOX 125 MCG tablet TAKE 1 TABLET BY MOUTH EVERY DAY 03/24/14   Marinus Maw, MD  diltiazem 2 % GEL Apply 1 application topically as needed (hemorrhoids).    Historical Provider, MD  loperamide (IMODIUM A-D) 2 MG tablet Take 2 mg by mouth 4 (four) times daily as needed for diarrhea or loose stools.    Historical Provider, MD  metoprolol (LOPRESSOR) 50 MG tablet TAKE 1 1/2 TABLETS BY MOUTH TWICE DAILY 03/21/14   Marinus Maw, MD  Omega 3 1000 MG CAPS Take 1,000 mg by mouth daily.    Historical Provider, MD  pantoprazole (PROTONIX) 40 MG tablet Take 40 mg by mouth daily.  08/10/12   Historical  Provider, MD  TRAVATAN Z 0.004 % SOLN ophthalmic solution Place 1 drop into both eyes at bedtime. 04/18/14   Historical Provider, MD  warfarin (COUMADIN) 5 MG tablet TAKE AS DIRECTED BY COUMADIN CLINIC 04/21/14   Marinus Maw, MD   BP 171/83 mmHg  Pulse 81  Temp(Src) 98.5 F (36.9 C) (Oral)  Resp 16  SpO2 95% Physical Exam  Constitutional: She is oriented to person, place, and time. She appears well-developed and well-nourished. No distress.  Cardiovascular: Normal rate.   Pulmonary/Chest: Effort normal.  Abdominal: Soft. Bowel sounds are normal. She exhibits no distension. There is tenderness (mild diffuse). There is no rebound and no guarding.  No CVA tenderness  Neurological: She is alert and oriented to person, place, and time.    ED Course  Procedures (including critical care time) Labs Review Labs Reviewed  POCT URINALYSIS DIP (DEVICE) - Abnormal; Notable for the following:    Hgb urine dipstick MODERATE (*)    Protein, ur 100 (*)    Leukocytes, UA LARGE (*)    All other components within normal limits  URINE CULTURE    Imaging Review No results found.   MDM   1. UTI (lower urinary tract infection)    UA is concerning for infection. Urine sent for culture. We'll have her start the Cipro prescription from her primary care doctor. This will give her 4-1/2 days of Cipro. Recommended that she make an appointment to have her INR checked this week. Follow-up as needed.    Charm Rings, MD 07/10/14 [phone]

## 2014-07-10 NOTE — ED Notes (Signed)
C/o uti sx States she has pressure, burning and stinging when urinating  Admits to abd pain A culture was done at primary which was negative for uti States cipro was prescribe by primary but only took one dose due to results

## 2014-07-11 ENCOUNTER — Telehealth: Payer: Self-pay | Admitting: *Deleted

## 2014-07-11 LAB — URINE CULTURE

## 2014-07-11 NOTE — Telephone Encounter (Signed)
Seen in ER on June 20th and diagnosed with UTI and instructed to take Cipro 250 mg bid for 5 days had been given this antibiotic from PCP earlier but was called and informed culture came back negative so she had only taken 1 tablet. Appt made for her to have INR checked on Thursday June 23rd and pt states understanding.

## 2014-07-13 ENCOUNTER — Ambulatory Visit (INDEPENDENT_AMBULATORY_CARE_PROVIDER_SITE_OTHER): Payer: Medicare Other

## 2014-07-13 DIAGNOSIS — Z952 Presence of prosthetic heart valve: Secondary | ICD-10-CM

## 2014-07-13 DIAGNOSIS — Z7901 Long term (current) use of anticoagulants: Secondary | ICD-10-CM

## 2014-07-13 DIAGNOSIS — I4891 Unspecified atrial fibrillation: Secondary | ICD-10-CM | POA: Diagnosis not present

## 2014-07-13 DIAGNOSIS — Z5181 Encounter for therapeutic drug level monitoring: Secondary | ICD-10-CM

## 2014-07-13 DIAGNOSIS — Z954 Presence of other heart-valve replacement: Secondary | ICD-10-CM

## 2014-07-13 LAB — POCT INR: INR: 2.6

## 2014-08-10 ENCOUNTER — Ambulatory Visit (INDEPENDENT_AMBULATORY_CARE_PROVIDER_SITE_OTHER): Payer: Medicare Other | Admitting: *Deleted

## 2014-08-10 DIAGNOSIS — Z952 Presence of prosthetic heart valve: Secondary | ICD-10-CM

## 2014-08-10 DIAGNOSIS — Z5181 Encounter for therapeutic drug level monitoring: Secondary | ICD-10-CM | POA: Diagnosis not present

## 2014-08-10 DIAGNOSIS — Z954 Presence of other heart-valve replacement: Secondary | ICD-10-CM | POA: Diagnosis not present

## 2014-08-10 DIAGNOSIS — I4891 Unspecified atrial fibrillation: Secondary | ICD-10-CM | POA: Diagnosis not present

## 2014-08-10 DIAGNOSIS — Z7901 Long term (current) use of anticoagulants: Secondary | ICD-10-CM

## 2014-08-10 LAB — POCT INR: INR: 2.6

## 2014-09-07 ENCOUNTER — Ambulatory Visit (INDEPENDENT_AMBULATORY_CARE_PROVIDER_SITE_OTHER): Payer: Medicare Other | Admitting: *Deleted

## 2014-09-07 DIAGNOSIS — Z5181 Encounter for therapeutic drug level monitoring: Secondary | ICD-10-CM

## 2014-09-07 DIAGNOSIS — Z954 Presence of other heart-valve replacement: Secondary | ICD-10-CM | POA: Diagnosis not present

## 2014-09-07 DIAGNOSIS — Z7901 Long term (current) use of anticoagulants: Secondary | ICD-10-CM

## 2014-09-07 DIAGNOSIS — I4891 Unspecified atrial fibrillation: Secondary | ICD-10-CM

## 2014-09-07 DIAGNOSIS — Z952 Presence of prosthetic heart valve: Secondary | ICD-10-CM

## 2014-09-07 LAB — POCT INR: INR: 2.2

## 2014-09-26 ENCOUNTER — Ambulatory Visit (INDEPENDENT_AMBULATORY_CARE_PROVIDER_SITE_OTHER): Payer: Medicare Other | Admitting: Pharmacist Clinician (PhC)/ Clinical Pharmacy Specialist

## 2014-09-26 DIAGNOSIS — Z5181 Encounter for therapeutic drug level monitoring: Secondary | ICD-10-CM

## 2014-09-26 DIAGNOSIS — I4891 Unspecified atrial fibrillation: Secondary | ICD-10-CM

## 2014-09-26 DIAGNOSIS — Z7901 Long term (current) use of anticoagulants: Secondary | ICD-10-CM

## 2014-09-26 DIAGNOSIS — Z954 Presence of other heart-valve replacement: Secondary | ICD-10-CM | POA: Diagnosis not present

## 2014-09-26 DIAGNOSIS — Z952 Presence of prosthetic heart valve: Secondary | ICD-10-CM

## 2014-09-26 LAB — POCT INR: INR: 5.5

## 2014-10-06 ENCOUNTER — Ambulatory Visit (INDEPENDENT_AMBULATORY_CARE_PROVIDER_SITE_OTHER): Payer: Medicare Other | Admitting: *Deleted

## 2014-10-06 DIAGNOSIS — Z954 Presence of other heart-valve replacement: Secondary | ICD-10-CM | POA: Diagnosis not present

## 2014-10-06 DIAGNOSIS — Z7901 Long term (current) use of anticoagulants: Secondary | ICD-10-CM | POA: Diagnosis not present

## 2014-10-06 DIAGNOSIS — Z5181 Encounter for therapeutic drug level monitoring: Secondary | ICD-10-CM | POA: Diagnosis not present

## 2014-10-06 DIAGNOSIS — Z952 Presence of prosthetic heart valve: Secondary | ICD-10-CM

## 2014-10-06 DIAGNOSIS — I4891 Unspecified atrial fibrillation: Secondary | ICD-10-CM

## 2014-10-06 LAB — POCT INR: INR: 2.3

## 2014-10-10 ENCOUNTER — Telehealth: Payer: Self-pay | Admitting: Internal Medicine

## 2014-10-10 NOTE — Telephone Encounter (Signed)
PATIENT HAS NOT PICKED UP DEPARTMENT OF TRANSPORTATION PAPERS,THESE WERE PLACED IN PURPLE FOLDER FOR FUTURE PICK UP 10/10/14/KM

## 2014-10-20 ENCOUNTER — Ambulatory Visit (INDEPENDENT_AMBULATORY_CARE_PROVIDER_SITE_OTHER): Payer: Medicare Other | Admitting: *Deleted

## 2014-10-20 DIAGNOSIS — Z954 Presence of other heart-valve replacement: Secondary | ICD-10-CM | POA: Diagnosis not present

## 2014-10-20 DIAGNOSIS — Z5181 Encounter for therapeutic drug level monitoring: Secondary | ICD-10-CM | POA: Diagnosis not present

## 2014-10-20 DIAGNOSIS — I4891 Unspecified atrial fibrillation: Secondary | ICD-10-CM

## 2014-10-20 DIAGNOSIS — Z952 Presence of prosthetic heart valve: Secondary | ICD-10-CM

## 2014-10-20 DIAGNOSIS — Z7901 Long term (current) use of anticoagulants: Secondary | ICD-10-CM | POA: Diagnosis not present

## 2014-10-20 LAB — POCT INR: INR: 2.3

## 2014-11-03 ENCOUNTER — Ambulatory Visit (INDEPENDENT_AMBULATORY_CARE_PROVIDER_SITE_OTHER): Payer: Medicare Other | Admitting: *Deleted

## 2014-11-03 DIAGNOSIS — Z5181 Encounter for therapeutic drug level monitoring: Secondary | ICD-10-CM

## 2014-11-03 DIAGNOSIS — Z7901 Long term (current) use of anticoagulants: Secondary | ICD-10-CM | POA: Diagnosis not present

## 2014-11-03 DIAGNOSIS — I4891 Unspecified atrial fibrillation: Secondary | ICD-10-CM

## 2014-11-03 DIAGNOSIS — Z954 Presence of other heart-valve replacement: Secondary | ICD-10-CM

## 2014-11-03 DIAGNOSIS — Z952 Presence of prosthetic heart valve: Secondary | ICD-10-CM

## 2014-11-03 LAB — POCT INR: INR: 2.7

## 2014-11-15 ENCOUNTER — Other Ambulatory Visit: Payer: Self-pay | Admitting: Internal Medicine

## 2014-11-24 ENCOUNTER — Ambulatory Visit (INDEPENDENT_AMBULATORY_CARE_PROVIDER_SITE_OTHER): Payer: Medicare Other | Admitting: *Deleted

## 2014-11-24 DIAGNOSIS — Z954 Presence of other heart-valve replacement: Secondary | ICD-10-CM | POA: Diagnosis not present

## 2014-11-24 DIAGNOSIS — Z7901 Long term (current) use of anticoagulants: Secondary | ICD-10-CM | POA: Diagnosis not present

## 2014-11-24 DIAGNOSIS — I4891 Unspecified atrial fibrillation: Secondary | ICD-10-CM

## 2014-11-24 DIAGNOSIS — Z952 Presence of prosthetic heart valve: Secondary | ICD-10-CM

## 2014-11-24 DIAGNOSIS — Z5181 Encounter for therapeutic drug level monitoring: Secondary | ICD-10-CM | POA: Diagnosis not present

## 2014-11-24 LAB — POCT INR: INR: 2.2

## 2014-12-18 ENCOUNTER — Ambulatory Visit (INDEPENDENT_AMBULATORY_CARE_PROVIDER_SITE_OTHER): Payer: Medicare Other | Admitting: *Deleted

## 2014-12-18 DIAGNOSIS — Z952 Presence of prosthetic heart valve: Secondary | ICD-10-CM

## 2014-12-18 DIAGNOSIS — I4891 Unspecified atrial fibrillation: Secondary | ICD-10-CM | POA: Diagnosis not present

## 2014-12-18 DIAGNOSIS — Z954 Presence of other heart-valve replacement: Secondary | ICD-10-CM | POA: Diagnosis not present

## 2014-12-18 DIAGNOSIS — Z5181 Encounter for therapeutic drug level monitoring: Secondary | ICD-10-CM

## 2014-12-18 DIAGNOSIS — Z7901 Long term (current) use of anticoagulants: Secondary | ICD-10-CM

## 2014-12-18 LAB — POCT INR: INR: 2.4

## 2015-01-01 ENCOUNTER — Ambulatory Visit (INDEPENDENT_AMBULATORY_CARE_PROVIDER_SITE_OTHER): Payer: Medicare Other | Admitting: *Deleted

## 2015-01-01 DIAGNOSIS — Z7901 Long term (current) use of anticoagulants: Secondary | ICD-10-CM | POA: Diagnosis not present

## 2015-01-01 DIAGNOSIS — Z954 Presence of other heart-valve replacement: Secondary | ICD-10-CM | POA: Diagnosis not present

## 2015-01-01 DIAGNOSIS — Z5181 Encounter for therapeutic drug level monitoring: Secondary | ICD-10-CM

## 2015-01-01 DIAGNOSIS — I4891 Unspecified atrial fibrillation: Secondary | ICD-10-CM

## 2015-01-01 DIAGNOSIS — Z952 Presence of prosthetic heart valve: Secondary | ICD-10-CM

## 2015-01-01 LAB — POCT INR: INR: 3.2

## 2015-01-12 ENCOUNTER — Ambulatory Visit: Payer: Medicare Other | Admitting: Internal Medicine

## 2015-01-16 ENCOUNTER — Ambulatory Visit (INDEPENDENT_AMBULATORY_CARE_PROVIDER_SITE_OTHER): Payer: Medicare Other | Admitting: *Deleted

## 2015-01-16 DIAGNOSIS — Z5181 Encounter for therapeutic drug level monitoring: Secondary | ICD-10-CM

## 2015-01-16 DIAGNOSIS — Z7901 Long term (current) use of anticoagulants: Secondary | ICD-10-CM | POA: Diagnosis not present

## 2015-01-16 DIAGNOSIS — Z952 Presence of prosthetic heart valve: Secondary | ICD-10-CM

## 2015-01-16 DIAGNOSIS — Z954 Presence of other heart-valve replacement: Secondary | ICD-10-CM

## 2015-01-16 DIAGNOSIS — I4891 Unspecified atrial fibrillation: Secondary | ICD-10-CM | POA: Diagnosis not present

## 2015-01-16 LAB — POCT INR: INR: 3.5

## 2015-01-18 ENCOUNTER — Other Ambulatory Visit: Payer: Self-pay

## 2015-01-18 DIAGNOSIS — Z1231 Encounter for screening mammogram for malignant neoplasm of breast: Secondary | ICD-10-CM

## 2015-02-14 ENCOUNTER — Other Ambulatory Visit: Payer: Self-pay | Admitting: Internal Medicine

## 2015-02-14 NOTE — Telephone Encounter (Signed)
Tami Maw, MD at 02/28/2014 4:08 PM  metoprolol (LOPRESSOR) 50 MG tablet TAKE 1 1/2 TABLET BY MOUTH TWICE DAILY         Patient Instructions     Your physician recommends that you continue on your current medications as directed. Please refer to the Current Medication list given to you tod

## 2015-02-15 ENCOUNTER — Telehealth: Payer: Self-pay | Admitting: Internal Medicine

## 2015-02-15 ENCOUNTER — Ambulatory Visit (INDEPENDENT_AMBULATORY_CARE_PROVIDER_SITE_OTHER): Payer: Medicare Other | Admitting: Internal Medicine

## 2015-02-15 ENCOUNTER — Encounter: Payer: Self-pay | Admitting: Internal Medicine

## 2015-02-15 ENCOUNTER — Ambulatory Visit (INDEPENDENT_AMBULATORY_CARE_PROVIDER_SITE_OTHER): Payer: Medicare Other | Admitting: *Deleted

## 2015-02-15 VITALS — BP 142/98 | HR 61 | Ht 67.5 in | Wt 178.8 lb

## 2015-02-15 DIAGNOSIS — I482 Chronic atrial fibrillation, unspecified: Secondary | ICD-10-CM

## 2015-02-15 DIAGNOSIS — I4891 Unspecified atrial fibrillation: Secondary | ICD-10-CM | POA: Diagnosis not present

## 2015-02-15 DIAGNOSIS — Z954 Presence of other heart-valve replacement: Secondary | ICD-10-CM | POA: Diagnosis not present

## 2015-02-15 DIAGNOSIS — I10 Essential (primary) hypertension: Secondary | ICD-10-CM | POA: Diagnosis not present

## 2015-02-15 DIAGNOSIS — Z7901 Long term (current) use of anticoagulants: Secondary | ICD-10-CM | POA: Diagnosis not present

## 2015-02-15 DIAGNOSIS — Z5181 Encounter for therapeutic drug level monitoring: Secondary | ICD-10-CM | POA: Diagnosis not present

## 2015-02-15 DIAGNOSIS — Z952 Presence of prosthetic heart valve: Secondary | ICD-10-CM

## 2015-02-15 LAB — POCT INR: INR: 3.5

## 2015-02-15 NOTE — Progress Notes (Signed)
HPI  [name] returns today for followup. She is a very pleasant 80 year old woman with a history of mitral valve disease status post mitral valve replacement, chronic atrial fibrillation, hypertension, nonsustained ventricular tachycardia, also with a history of epistaxis. In the interim, she has done well. She is walking regularly, and exercising. She admits to sodium indiscretion however. No syncope. No peripheral edema. She has not had any residual nosebleeds. She notes that she eats greeens 3-4 times a week. Her blood pressure is high. She did not take her meds this morning.   Allergies  Allergen Reactions  . Esomeprazole Magnesium     REACTION: stomach upset, nausea     Current Outpatient Prescriptions  Medication Sig Dispense Refill  . amoxicillin (AMOXIL) 500 MG capsule TAKE 4 CAPSULES BY MOUTH 1 HOUR PRIOR TO DENTAL WORK 4 capsule 0  . BENICAR HCT 20-12.5 MG per tablet Take 1 tablet by mouth daily.    . Brinzolamide-Brimonidine 1-0.2 % SUSP Place 1 drop into both eyes 3 (three) times daily.    . Calcium Carbonate-Vitamin D (CALCIUM + D PO) Take by mouth daily.    Marland Kitchen CARTIA XT 180 MG 24 hr capsule TAKE 1 CAPSULE BY MOUTH DAILY 90 capsule 1  . Cholecalciferol (VITAMIN D) 2000 UNITS tablet Take 2,000 Units by mouth daily.    . ciprofloxacin (CILOXAN) 0.3 % ophthalmic ointment Place 1 drop into the right eye 2 (two) times daily.    Marland Kitchen DIGOX 125 MCG tablet TAKE 1 TABLET BY MOUTH EVERY DAY 90 tablet 3  . diltiazem 2 % GEL Apply 1 application topically as needed (hemorrhoids).    . loperamide (IMODIUM A-D) 2 MG tablet Take 2 mg by mouth 4 (four) times daily as needed for diarrhea or loose stools.    . metoprolol (LOPRESSOR) 50 MG tablet TAKE 1 1/2 TABLETS BY MOUTH TWICE DAILY 90 tablet 0  . mirabegron ER (MYRBETRIQ) 25 MG TB24 tablet Take 25 mg by mouth daily.    . Omega 3 1000 MG CAPS Take 1,000 mg by mouth daily.    Marland Kitchen oxybutynin (DITROPAN-XL) 5 MG 24 hr tablet Take 5 mg by mouth at  bedtime.    . pantoprazole (PROTONIX) 40 MG tablet Take 40 mg by mouth daily.     . TRAVATAN Z 0.004 % SOLN ophthalmic solution Place 1 drop into both eyes at bedtime.  12  . warfarin (COUMADIN) 5 MG tablet TAKE AS DIRECTED BY COUMADIN CLINIC 100 tablet 1   No current facility-administered medications for this visit.   Facility-Administered Medications Ordered in Other Visits  Medication Dose Route Frequency Provider Last Rate Last Dose  . mitoMYcin (MUTAMYCIN) Injection Use in OR only (0.4 mg/ml)  0.5 mL Right Eye Once Chalmers Guest, MD         Past Medical History  Diagnosis Date  . Hypertension   . GI bleed   . Chronic atrial fibrillation (HCC)   . Dilated cardiomyopathy (HCC)     history of this, now resolved  . GERD (gastroesophageal reflux disease)   . Dysphagia     Hx  . Nonsustained ventricular tachycardia (HCC)   . Osteoporosis   . Microcytic anemia   . Arrhythmia     chronic afib NSVT  . RBBB (right bundle branch block)   . Heart murmur   . Asthma     years ago  . History of blood transfusion   . Protein in urine   . Hemorrhoid 04/2014  bleeding    ROS:   All systems reviewed and negative except as noted in the HPI.   Past Surgical History  Procedure Laterality Date  . Cholecystectomy    . Mitral valve prosthesis with angioplasty  1987  . Tubal ligation    . Transthoracic echocardiogram  02/2008, 03/2006, 06/2004  . Polyp removal    . Colonoscopy    . Trabeculectomy Right 05/17/2014    Procedure: TRABECULECTOMY WITH MITOMYCIN RIGHT EYE;  Surgeon: Chalmers Guest, MD;  Location: [facility] OR;  Service: Ophthalmology;  Laterality: Right;  . Mitomycin c application Right 05/17/2014    Procedure: MITOMYCIN C APPLICATION;  Surgeon: Chalmers Guest, MD;  Location: [facility] OR;  Service: Ophthalmology;  Laterality: Right;     Family History  Problem Relation Age of Onset  . Hypertension Mother   . Colon cancer Neg Hx   . Cancer Sister     in the Bladder     Social  History   Social History  . Marital Status: Divorced    Spouse Name: N/A  . Number of Children: N/A  . Years of Education: N/A   Occupational History  . Not on file.   Social History Main Topics  . Smoking status: Former Games developer  . Smokeless tobacco: Not on file     Comment: quiit 1987  . Alcohol Use: No  . Drug Use: No  . Sexual Activity: Not on file   Other Topics Concern  . Not on file   Social History Narrative     BP 142/98 mmHg  Pulse 61  Ht 5' 7.5" (1.715 m)  Wt 178 lb 12.8 oz (81.103 kg)  BMI 27.57 kg/m2  Physical Exam:  Well appearing 80 year old woman, NAD HEENT: Unremarkable Neck:  6 cm JVD, no thyromegally Lungs:  Clear with no wheezes, rales, or rhonchi. HEART:  IRegular rate rhythm, 2/6 systolic murmur, no rubs, no clicks. Mechanical S1 closure. Abd:  soft, positive bowel sounds, no organomegally, no rebound, no guarding Ext:  2 plus pulses, trace edema, no cyanosis, no clubbing Skin:  No rashes no nodules Neuro:  CN II through XII intact, motor grossly intact  EKG Atrial fibrillation with a controlled ventricular response and right bundle branch block.  Assess/Plan:

## 2015-02-15 NOTE — Assessment & Plan Note (Signed)
Her ventricular rate is well controlled. No change in meds. 

## 2015-02-15 NOTE — Assessment & Plan Note (Signed)
Her blood pressure is well controlled normally. Today she is hypertensive but did not take her morning meds. She notes that her pressures are usually well controlled.

## 2015-02-15 NOTE — Telephone Encounter (Signed)
New message    Patient was told to call back with information for nurse. Patient states some medication that listed she  is not longer taken.     New medication  olmexartanmedox/hctz  40/25 mg tablet. One table by mouth benifiber   florasor suupplement  Right eye - simbrina 1dash 02% twice a day  travatann is only right eye - 2 drop twice a day    Left eye prednisoloneac 1% 5 mg  Once a day  Eye drop once a day - optive

## 2015-02-15 NOTE — Patient Instructions (Signed)

## 2015-02-15 NOTE — Assessment & Plan Note (Signed)
Her INR's have been in the 3-3.5 range. She is not bleeding. Will follow.

## 2015-02-26 ENCOUNTER — Ambulatory Visit
Admission: RE | Admit: 2015-02-26 | Discharge: 2015-02-26 | Disposition: A | Discharge status: Home / Self Care | Payer: Medicare Other | Source: Ambulatory Visit

## 2015-02-26 DIAGNOSIS — Z1231 Encounter for screening mammogram for malignant neoplasm of breast: Secondary | ICD-10-CM

## 2015-03-07 ENCOUNTER — Other Ambulatory Visit: Payer: Self-pay | Admitting: Internal Medicine

## 2015-03-14 ENCOUNTER — Other Ambulatory Visit: Payer: Self-pay | Admitting: Internal Medicine

## 2015-03-15 ENCOUNTER — Ambulatory Visit (INDEPENDENT_AMBULATORY_CARE_PROVIDER_SITE_OTHER): Payer: Medicare Other | Admitting: *Deleted

## 2015-03-15 DIAGNOSIS — Z7901 Long term (current) use of anticoagulants: Secondary | ICD-10-CM

## 2015-03-15 DIAGNOSIS — Z952 Presence of prosthetic heart valve: Secondary | ICD-10-CM

## 2015-03-15 DIAGNOSIS — I4891 Unspecified atrial fibrillation: Secondary | ICD-10-CM | POA: Diagnosis not present

## 2015-03-15 DIAGNOSIS — Z954 Presence of other heart-valve replacement: Secondary | ICD-10-CM | POA: Diagnosis not present

## 2015-03-15 DIAGNOSIS — I482 Chronic atrial fibrillation, unspecified: Secondary | ICD-10-CM

## 2015-03-15 DIAGNOSIS — Z5181 Encounter for therapeutic drug level monitoring: Secondary | ICD-10-CM

## 2015-03-15 LAB — POCT INR: INR: 3.2

## 2015-04-18 ENCOUNTER — Emergency Department (HOSPITAL_COMMUNITY)
Admission: EM | Admit: 2015-04-18 | Discharge: 2015-04-18 | Disposition: A | Discharge status: Home / Self Care | Payer: Medicare Other | Attending: Emergency Medicine | Admitting: Emergency Medicine

## 2015-04-18 ENCOUNTER — Encounter (HOSPITAL_COMMUNITY): Payer: Self-pay | Admitting: Emergency Medicine

## 2015-04-18 ENCOUNTER — Emergency Department (HOSPITAL_COMMUNITY): Payer: Medicare Other

## 2015-04-18 DIAGNOSIS — Z862 Personal history of diseases of the blood and blood-forming organs and certain disorders involving the immune mechanism: Secondary | ICD-10-CM | POA: Diagnosis not present

## 2015-04-18 DIAGNOSIS — J45901 Unspecified asthma with (acute) exacerbation: Secondary | ICD-10-CM | POA: Insufficient documentation

## 2015-04-18 DIAGNOSIS — Z87891 Personal history of nicotine dependence: Secondary | ICD-10-CM | POA: Diagnosis not present

## 2015-04-18 DIAGNOSIS — R5383 Other fatigue: Secondary | ICD-10-CM | POA: Diagnosis not present

## 2015-04-18 DIAGNOSIS — R6889 Other general symptoms and signs: Secondary | ICD-10-CM

## 2015-04-18 DIAGNOSIS — R079 Chest pain, unspecified: Secondary | ICD-10-CM | POA: Diagnosis not present

## 2015-04-18 DIAGNOSIS — K219 Gastro-esophageal reflux disease without esophagitis: Secondary | ICD-10-CM | POA: Insufficient documentation

## 2015-04-18 DIAGNOSIS — Z79899 Other long term (current) drug therapy: Secondary | ICD-10-CM | POA: Insufficient documentation

## 2015-04-18 DIAGNOSIS — Z7901 Long term (current) use of anticoagulants: Secondary | ICD-10-CM | POA: Insufficient documentation

## 2015-04-18 DIAGNOSIS — I10 Essential (primary) hypertension: Secondary | ICD-10-CM | POA: Diagnosis not present

## 2015-04-18 DIAGNOSIS — I482 Chronic atrial fibrillation: Secondary | ICD-10-CM | POA: Diagnosis not present

## 2015-04-18 DIAGNOSIS — R197 Diarrhea, unspecified: Secondary | ICD-10-CM | POA: Diagnosis not present

## 2015-04-18 DIAGNOSIS — Z8739 Personal history of other diseases of the musculoskeletal system and connective tissue: Secondary | ICD-10-CM | POA: Insufficient documentation

## 2015-04-18 DIAGNOSIS — R011 Cardiac murmur, unspecified: Secondary | ICD-10-CM | POA: Diagnosis not present

## 2015-04-18 LAB — CBC
HCT: 35.4 % — ABNORMAL LOW (ref 36.0–46.0)
Hemoglobin: 11.5 g/dL — ABNORMAL LOW (ref 12.0–15.0)
MCH: 27.7 pg (ref 26.0–34.0)
MCHC: 32.5 g/dL (ref 30.0–36.0)
MCV: 85.3 fL (ref 78.0–100.0)
Platelets: 195 10*3/uL (ref 150–400)
RBC: 4.15 MIL/uL (ref 3.87–5.11)
RDW: 14.4 % (ref 11.5–15.5)
WBC: 5.8 10*3/uL (ref 4.0–10.5)

## 2015-04-18 LAB — BASIC METABOLIC PANEL
Anion gap: 9 (ref 5–15)
BUN: 25 mg/dL — ABNORMAL HIGH (ref 6–20)
CO2: 22 mmol/L (ref 22–32)
Calcium: 9.3 mg/dL (ref 8.9–10.3)
Chloride: 107 mmol/L (ref 101–111)
Creatinine, Ser: 1.69 mg/dL — ABNORMAL HIGH (ref 0.44–1.00)
GFR calc Af Amer: 33 mL/min — ABNORMAL LOW (ref >60)
GFR calc non Af Amer: 28 mL/min — ABNORMAL LOW (ref >60)
Glucose, Bld: 99 mg/dL (ref 65–99)
Potassium: 3.9 mmol/L (ref 3.5–5.1)
Sodium: 138 mmol/L (ref 135–145)

## 2015-04-18 LAB — I-STAT TROPONIN, ED: Troponin i, poc: 0.01 ng/mL (ref 0.00–0.08)

## 2015-04-18 NOTE — ED Provider Notes (Addendum)
CSN: [id number]     Arrival date & time 04/18/15  1204 History   First MD Initiated Contact with Patient 04/18/15 1315     Chief Complaint  Patient presents with  . Chest Pain     (Consider location/radiation/quality/duration/timing/severity/associated sxs/prior Treatment) Patient is a 80 y.o. female presenting with chest pain. The history is provided by the patient and a relative.  Chest Pain Associated symptoms: cough, fatigue, fever and shortness of breath   Associated symptoms: no abdominal pain   Patient with onset of flulike symptoms on Thursday with cough occasionally productive fevers on and off bodyaches. Diarrhea about 2 loose bowel movements a day. And also has had anterior chest pain since Saturday. Patient has a cardiac history valve replacement on Coumadin. No nausea no vomiting. Chest pain made worse with cough.  Past Medical History  Diagnosis Date  . Hypertension   . GI bleed   . Chronic atrial fibrillation (HCC)   . Dilated cardiomyopathy (HCC)     history of this, now resolved  . GERD (gastroesophageal reflux disease)   . Dysphagia     Hx  . Nonsustained ventricular tachycardia (HCC)   . Osteoporosis   . Microcytic anemia   . Arrhythmia     chronic afib NSVT  . RBBB (right bundle branch block)   . Heart murmur   . Asthma     years ago  . History of blood transfusion   . Protein in urine   . Hemorrhoid 04/2014    bleeding   Past Surgical History  Procedure Laterality Date  . Cholecystectomy    . Mitral valve prosthesis with angioplasty  1987  . Tubal ligation    . Transthoracic echocardiogram  02/2008, 03/2006, 06/2004  . Polyp removal    . Colonoscopy    . Trabeculectomy Right 05/17/2014    Procedure: TRABECULECTOMY WITH MITOMYCIN RIGHT EYE;  Surgeon: Chalmers Guest, MD;  Location: [facility] OR;  Service: Ophthalmology;  Laterality: Right;  . Mitomycin c application Right 05/17/2014    Procedure: MITOMYCIN C APPLICATION;  Surgeon: Chalmers Guest, MD;  Location: [facility]  OR;  Service: Ophthalmology;  Laterality: Right;   Family History  Problem Relation Age of Onset  . Hypertension Mother   . Colon cancer Neg Hx   . Cancer Sister     in the Bladder   Social History  Substance Use Topics  . Smoking status: Former Games developer  . Smokeless tobacco: None     Comment: quiit 1987  . Alcohol Use: No   OB History    No data available     Review of Systems  Constitutional: Positive for fever and fatigue.  HENT: Positive for congestion.   Eyes: Negative for visual disturbance.  Respiratory: Positive for cough and shortness of breath.   Cardiovascular: Positive for chest pain.  Gastrointestinal: Positive for diarrhea. Negative for abdominal pain.  Genitourinary: Negative for dysuria.  Musculoskeletal: Positive for myalgias.  Skin: Negative for rash.  Hematological: Bruises/bleeds easily.  Psychiatric/Behavioral: Negative for confusion.      Allergies  Esomeprazole magnesium  Home Medications   Prior to Admission medications   Medication Sig Start Date End Date Taking? Authorizing Provider  amoxicillin (AMOXIL) 500 MG capsule TAKE 4 CAPSULES BY MOUTH 1 HOUR PRIOR TO DENTAL WORK 04/27/14  Yes Marinus Maw, MD  Brinzolamide-Brimonidine 1-0.2 % SUSP Place 1 drop into the right eye 2 (two) times daily.    Yes Historical Provider, MD  CARTIA XT 180 MG 24 hr  capsule TAKE 1 CAPSULE BY MOUTH DAILY 11/15/14  Yes Marinus MawGregg W Taylor, MD  Cholecalciferol (VITAMIN D) 2000 UNITS tablet Take 2,000 Units by mouth daily.   Yes Historical Provider, MD  DIGOX 125 MCG tablet TAKE 1 TABLET BY MOUTH EVERY DAY 03/07/15  Yes Marinus MawGregg W Taylor, MD  lipase/protease/amylase (CREON) 12000 units CPEP capsule Take 48,000 Units by mouth 3 (three) times daily before meals.   Yes Historical Provider, MD  metoprolol (LOPRESSOR) 50 MG tablet TAKE 1 1/2 TABLETS BY MOUTH TWICE DAILY Patient taking differently: TAKE 1.5 TABLETS (75mg ) BY MOUTH TWICE DAILY 03/14/15  Yes Marinus MawGregg W Taylor, MD   mirabegron ER (MYRBETRIQ) 25 MG TB24 tablet Take 25 mg by mouth daily.   Yes Historical Provider, MD  olmesartan-hydrochlorothiazide (BENICAR HCT) 40-25 MG tablet Take 1 tablet by mouth daily. 12/22/14  Yes Historical Provider, MD  pantoprazole (PROTONIX) 40 MG tablet Take 40 mg by mouth daily.  08/10/12  Yes Historical Provider, MD  TRAVATAN Z 0.004 % SOLN ophthalmic solution Place 2 drops into the right eye at bedtime.  04/18/14  Yes Historical Provider, MD  warfarin (COUMADIN) 5 MG tablet TAKE AS DIRECTED BY COUMADIN CLINIC Patient taking differently: pt takes 7.5mg  on M, Th, takes 5mg  on all other days 04/21/14  Yes Marinus MawGregg W Taylor, MD   BP 167/80 mmHg  Pulse 57  Temp(Src) 97.9 F (36.6 C) (Oral)  Resp 16  SpO2 100% Physical Exam  Constitutional: She is oriented to person, place, and time. She appears well-developed and well-nourished. No distress.  HENT:  Head: Normocephalic and atraumatic.  Mouth/Throat: Oropharynx is clear and moist.  Eyes: Conjunctivae and EOM are normal. Pupils are equal, round, and reactive to light.  Neck: Normal range of motion. Neck supple.  Cardiovascular: Normal rate, regular rhythm and normal heart sounds.   No murmur heard. Pulmonary/Chest: Effort normal and breath sounds normal. No respiratory distress. She has no wheezes.  Abdominal: Soft. Bowel sounds are normal. There is no tenderness.  Musculoskeletal: Normal range of motion. She exhibits no edema.  Neurological: She is alert and oriented to person, place, and time. No cranial nerve deficit. She exhibits normal muscle tone. Coordination normal.  Skin: Skin is warm. No rash noted.  Nursing note and vitals reviewed.   ED Course  Procedures (including critical care time) Labs Review Labs Reviewed  BASIC METABOLIC PANEL - Abnormal; Notable for the following:    BUN 25 (*)    Creatinine, Ser 1.69 (*)    GFR calc non Af Amer 28 (*)    GFR calc Af Amer 33 (*)    All other components within normal  limits  CBC - Abnormal; Notable for the following:    Hemoglobin 11.5 (*)    HCT 35.4 (*)    All other components within normal limits  I-STAT TROPOININ, ED   Results for orders placed or performed during the hospital encounter of 04/18/15  Basic metabolic panel  Result Value Ref Range   Sodium 138 135 - 145 mmol/L   Potassium 3.9 3.5 - 5.1 mmol/L   Chloride 107 101 - 111 mmol/L   CO2 22 22 - 32 mmol/L   Glucose, Bld 99 65 - 99 mg/dL   BUN 25 (H) 6 - 20 mg/dL   Creatinine, Ser 2.131.69 (H) 0.44 - 1.00 mg/dL   Calcium 9.3 8.9 - 08.610.3 mg/dL   GFR calc non Af Amer 28 (L) >60 mL/min   GFR calc Af Amer 33 (L) >60 mL/min  Anion gap 9 5 - 15  CBC  Result Value Ref Range   WBC 5.8 4.0 - 10.5 K/uL   RBC 4.15 3.87 - 5.11 MIL/uL   Hemoglobin 11.5 (L) 12.0 - 15.0 g/dL   HCT 16.1 (L) 09.6 - 04.5 %   MCV 85.3 78.0 - 100.0 fL   MCH 27.7 26.0 - 34.0 pg   MCHC 32.5 30.0 - 36.0 g/dL   RDW 40.9 81.1 - 91.4 %   Platelets 195 150 - 400 K/uL  I-stat troponin, ED (not at [facility])  Result Value Ref Range   Troponin i, poc 0.01 0.00 - 0.08 ng/mL   Comment 3             Imaging Review Dg Chest 2 View  04/18/2015  CLINICAL DATA:  Chest pain, shortness of breath, cough for 5 days EXAM: CHEST  2 VIEW COMPARISON:  02/12/2006 FINDINGS: Cardiomediastinal silhouette is stable. The patient is status post median sternotomy. Mild hyperinflation. No acute infiltrate or pulmonary edema. Degenerative changes mid and lower thoracic spine. IMPRESSION: No active disease. Mild hyperinflation. Degenerative changes thoracic spine. Electronically Signed   By: Natasha Mead M.D.   On: 04/18/2015 12:55   I have personally reviewed and evaluated these images and lab results as part of my medical decision-making.   EKG Interpretation   Date/Time:  Wednesday April 18 2015 12:17:42 EDT Ventricular Rate:  70 PR Interval:    QRS Duration: 184 QT Interval:  456 QTC Calculation: 492 R Axis:   -65 Text Interpretation:   Atrial fibrillation Left axis deviation Right bundle  branch block Abnormal ECG Confirmed by Jerod Mcquain  MD, Kenyatta Gloeckner ([id number]) on  04/18/2015 2:10:47 PM      MDM   Final diagnoses:  Flu-like symptoms    Patient with flulike symptoms since Thursday. Patient's main symptoms are generalized weakness fever coughing occasionally productive oxygen saturation 0 100%. Chest x-ray negative for pneumonia. Labs without significant abnormalities. Patient's EKG is unchanged. Troponin is negative. Patient has chest pain that has been present predominantly with cough. Spent present since Saturday nonstop. Feel this is not cardiac related. Patient will return for any new or worse symptoms. Patient has limitations on medications due to her heart valve. The patient is able to take Robitussin-DM.  Patient vital signs nontoxic no acute distress no evidence of sepsis.  Vanetta Mulders, MD 04/18/15 1436  Vanetta Mulders, MD 04/18/15 [phone]

## 2015-04-18 NOTE — Discharge Instructions (Signed)
Continue Robitussin for the cough. Symptoms most likely consistent with influenza. Return for development of fevers or any new or worse symptoms. Chest x-ray today negative for pneumonia.

## 2015-04-18 NOTE — ED Notes (Signed)
Pt reports CP since Saturday with generalized weakness. Pt also reports cough and nausea. Pt alert x4. NAD at this time.

## 2015-04-20 ENCOUNTER — Other Ambulatory Visit: Payer: Self-pay | Admitting: Internal Medicine

## 2015-04-30 ENCOUNTER — Ambulatory Visit (INDEPENDENT_AMBULATORY_CARE_PROVIDER_SITE_OTHER): Payer: Medicare Other

## 2015-04-30 ENCOUNTER — Ambulatory Visit (INDEPENDENT_AMBULATORY_CARE_PROVIDER_SITE_OTHER): Payer: Medicare Other | Admitting: Student

## 2015-04-30 VITALS — BP 168/80 | HR 61 | Temp 98.0°F | Wt 180.2 lb

## 2015-04-30 DIAGNOSIS — I4891 Unspecified atrial fibrillation: Secondary | ICD-10-CM

## 2015-04-30 DIAGNOSIS — Z954 Presence of other heart-valve replacement: Secondary | ICD-10-CM

## 2015-04-30 DIAGNOSIS — I10 Essential (primary) hypertension: Secondary | ICD-10-CM | POA: Diagnosis not present

## 2015-04-30 DIAGNOSIS — Z7901 Long term (current) use of anticoagulants: Secondary | ICD-10-CM

## 2015-04-30 DIAGNOSIS — Z952 Presence of prosthetic heart valve: Secondary | ICD-10-CM

## 2015-04-30 DIAGNOSIS — I482 Chronic atrial fibrillation, unspecified: Secondary | ICD-10-CM

## 2015-04-30 DIAGNOSIS — Z5181 Encounter for therapeutic drug level monitoring: Secondary | ICD-10-CM | POA: Diagnosis not present

## 2015-04-30 DIAGNOSIS — Z0000 Encounter for general adult medical examination without abnormal findings: Secondary | ICD-10-CM | POA: Diagnosis not present

## 2015-04-30 LAB — COMPREHENSIVE METABOLIC PANEL
ALT: 15 U/L (ref 6–29)
AST: 18 U/L (ref 10–35)
Albumin: 4.3 g/dL (ref 3.6–5.1)
Alkaline Phosphatase: 51 U/L (ref 33–130)
BUN: 18 mg/dL (ref 7–25)
CO2: 25 mmol/L (ref 20–31)
Calcium: 10 mg/dL (ref 8.6–10.4)
Chloride: 104 mmol/L (ref 98–110)
Creat: 1.44 mg/dL — ABNORMAL HIGH (ref 0.60–0.93)
Glucose, Bld: 96 mg/dL (ref 65–99)
Potassium: 3.7 mmol/L (ref 3.5–5.3)
Sodium: 140 mmol/L (ref 135–146)
Total Bilirubin: 0.6 mg/dL (ref 0.2–1.2)
Total Protein: 8.3 g/dL — ABNORMAL HIGH (ref 6.1–8.1)

## 2015-04-30 LAB — CBC
HCT: 36.9 % (ref 35.0–45.0)
Hemoglobin: 11.7 g/dL (ref 11.7–15.5)
MCH: 26.8 pg — ABNORMAL LOW (ref 27.0–33.0)
MCHC: 31.7 g/dL — ABNORMAL LOW (ref 32.0–36.0)
MCV: 84.4 fL (ref 80.0–100.0)
MPV: 10 fL (ref 7.5–12.5)
Platelets: 237 10*3/uL (ref 140–400)
RBC: 4.37 MIL/uL (ref 3.80–5.10)
RDW: 15.3 % — ABNORMAL HIGH (ref 11.0–15.0)
WBC: 6.6 10*3/uL (ref 3.8–10.8)

## 2015-04-30 LAB — POCT INR: INR: 3.5

## 2015-04-30 NOTE — Patient Instructions (Signed)
Follow-up as needed with PCP  If questions or concerns call the office at [phone]

## 2015-04-30 NOTE — Assessment & Plan Note (Signed)
Baseline CBC CMP today. Blood pressure controlled with metoprolol, cartia. Mildly above goal at 168/80 but no symptoms - Will continue to follow

## 2015-04-30 NOTE — Progress Notes (Signed)
   Subjective:    Patient ID: [name], female    DOB: [birth year], 80 y.o.   MRN: [id number]   CC: Establish care  HPI:  80 y/o female presenting to establish care.   Only concern today is regarding chronic calluses on toes and feet which is managed by her podiatrist. She has an appointment with her podiatrist in the coming weeks. Denies difficulty walking.    Past Medical History  Diagnosis Date  . Hypertension   . GI bleed   . Chronic atrial fibrillation (HCC)   . Dilated cardiomyopathy (HCC)     history of this, now resolved  . GERD (gastroesophageal reflux disease)   . Dysphagia     Hx  . Nonsustained ventricular tachycardia (HCC)   . Osteoporosis   . Microcytic anemia   . Arrhythmia     chronic afib NSVT  . RBBB (right bundle branch block)   . Heart murmur   . Asthma     years ago  . History of blood transfusion   . Protein in urine   . Hemorrhoid 04/2014    bleeding     Past Surgical History  Procedure Laterality Date  . Cholecystectomy    . Mitral valve prosthesis with angioplasty  1987  . Tubal ligation    . Transthoracic echocardiogram  02/2008, 03/2006, 06/2004  . Polyp removal    . Colonoscopy    . Trabeculectomy Right 05/17/2014    Procedure: TRABECULECTOMY WITH MITOMYCIN RIGHT EYE;  Surgeon: Chalmers Guestoy Whitaker, MD;  [facility];  Service: Ophthalmology;  Laterality: Right;  . Mitomycin c application Right 05/17/2014    Procedure: MITOMYCIN C APPLICATION;  Surgeon: Chalmers Guestoy Whitaker, MD;  [facility];  Service: Ophthalmology;  Laterality: Right;   Social History  Substance Use Topics  . Smoking status: Former Games developermoker  . Smokeless tobacco: Not on file     Comment: quiit 1987  . Alcohol Use: No   Family History  Problem Relation Age of Onset  . Hypertension Mother   . Colon cancer Neg Hx   . Cancer Sister     in the Bladder     Review of Systems ROS Otherwise denies recent illnesses, fevers, nausea, vomiting, diarrhea, abdominal pain, chest  pain, shortness of breath Past Medical, Surgical, Social, and Family History Reviewed & Updated per EMR.   Objective:  BP 168/80 mmHg  Pulse 61  Temp(Src) 98 F (36.7 C) (Oral)  Wt 180 lb 3.2 oz (81.738 kg) Vitals and nursing note reviewed  General: NAD Cardiac: RRR,  Respiratory: CTAB, normal effort Extremities: Bilateral fifth toe calluses, callus on plantar surface of right second metatarsal head, else no edema or cyanosis. 2+ DP pulses Skin: warm and dry, no rashes noted Neuro: alert and oriented, no focal deficits   Assessment & Plan:    HYPERTENSION, BENIGN SYSTEMIC Baseline CBC CMP today. Blood pressure controlled with metoprolol, cartia. Mildly above goal at 168/80 but no symptoms - Will continue to follow     Alyssa A. Kennon RoundsHaney MD, MS Family Medicine Resident PGY-2 Pager [phone]

## 2015-05-13 ENCOUNTER — Other Ambulatory Visit: Payer: Self-pay | Admitting: Internal Medicine

## 2015-05-21 DIAGNOSIS — S82143A Displaced bicondylar fracture of unspecified tibia, initial encounter for closed fracture: Secondary | ICD-10-CM

## 2015-05-21 HISTORY — DX: Displaced bicondylar fracture of unspecified tibia, initial encounter for closed fracture: S82.143A

## 2015-05-22 ENCOUNTER — Emergency Department (HOSPITAL_COMMUNITY): Payer: Medicare Other

## 2015-05-22 ENCOUNTER — Observation Stay (HOSPITAL_COMMUNITY)
Admission: EM | Admit: 2015-05-22 | Discharge: 2015-05-25 | Disposition: A | Discharge status: Skilled Nursing Facility | Payer: Medicare Other | Attending: Family Medicine | Admitting: Family Medicine

## 2015-05-22 ENCOUNTER — Encounter (HOSPITAL_COMMUNITY): Payer: Self-pay | Admitting: Emergency Medicine

## 2015-05-22 DIAGNOSIS — Z7901 Long term (current) use of anticoagulants: Secondary | ICD-10-CM | POA: Diagnosis not present

## 2015-05-22 DIAGNOSIS — N179 Acute kidney failure, unspecified: Secondary | ICD-10-CM | POA: Insufficient documentation

## 2015-05-22 DIAGNOSIS — S82145A Nondisplaced bicondylar fracture of left tibia, initial encounter for closed fracture: Secondary | ICD-10-CM | POA: Diagnosis not present

## 2015-05-22 DIAGNOSIS — S82143D Displaced bicondylar fracture of unspecified tibia, subsequent encounter for closed fracture with routine healing: Secondary | ICD-10-CM | POA: Diagnosis not present

## 2015-05-22 DIAGNOSIS — Y93E1 Activity, personal bathing and showering: Secondary | ICD-10-CM | POA: Diagnosis not present

## 2015-05-22 DIAGNOSIS — Z87891 Personal history of nicotine dependence: Secondary | ICD-10-CM | POA: Diagnosis not present

## 2015-05-22 DIAGNOSIS — I129 Hypertensive chronic kidney disease with stage 1 through stage 4 chronic kidney disease, or unspecified chronic kidney disease: Secondary | ICD-10-CM | POA: Insufficient documentation

## 2015-05-22 DIAGNOSIS — S8012XA Contusion of left lower leg, initial encounter: Secondary | ICD-10-CM | POA: Insufficient documentation

## 2015-05-22 DIAGNOSIS — R262 Difficulty in walking, not elsewhere classified: Secondary | ICD-10-CM | POA: Insufficient documentation

## 2015-05-22 DIAGNOSIS — M1990 Unspecified osteoarthritis, unspecified site: Secondary | ICD-10-CM | POA: Insufficient documentation

## 2015-05-22 DIAGNOSIS — S82143A Displaced bicondylar fracture of unspecified tibia, initial encounter for closed fracture: Secondary | ICD-10-CM | POA: Diagnosis present

## 2015-05-22 DIAGNOSIS — N189 Chronic kidney disease, unspecified: Secondary | ICD-10-CM | POA: Diagnosis not present

## 2015-05-22 DIAGNOSIS — K219 Gastro-esophageal reflux disease without esophagitis: Secondary | ICD-10-CM | POA: Insufficient documentation

## 2015-05-22 DIAGNOSIS — S80822A Blister (nonthermal), left lower leg, initial encounter: Secondary | ICD-10-CM | POA: Diagnosis not present

## 2015-05-22 DIAGNOSIS — D649 Anemia, unspecified: Secondary | ICD-10-CM | POA: Insufficient documentation

## 2015-05-22 DIAGNOSIS — S82142A Displaced bicondylar fracture of left tibia, initial encounter for closed fracture: Principal | ICD-10-CM | POA: Insufficient documentation

## 2015-05-22 DIAGNOSIS — Z79899 Other long term (current) drug therapy: Secondary | ICD-10-CM | POA: Diagnosis not present

## 2015-05-22 DIAGNOSIS — W182XXA Fall in (into) shower or empty bathtub, initial encounter: Secondary | ICD-10-CM | POA: Insufficient documentation

## 2015-05-22 DIAGNOSIS — K5900 Constipation, unspecified: Secondary | ICD-10-CM | POA: Diagnosis not present

## 2015-05-22 DIAGNOSIS — Z952 Presence of prosthetic heart valve: Secondary | ICD-10-CM | POA: Diagnosis not present

## 2015-05-22 DIAGNOSIS — I482 Chronic atrial fibrillation: Secondary | ICD-10-CM | POA: Diagnosis not present

## 2015-05-22 DIAGNOSIS — R531 Weakness: Secondary | ICD-10-CM | POA: Insufficient documentation

## 2015-05-22 HISTORY — DX: Displaced bicondylar fracture of unspecified tibia, initial encounter for closed fracture: S82.143A

## 2015-05-22 LAB — BASIC METABOLIC PANEL
Anion gap: 10 (ref 5–15)
BUN: 26 mg/dL — ABNORMAL HIGH (ref 6–20)
CO2: 23 mmol/L (ref 22–32)
Calcium: 9.7 mg/dL (ref 8.9–10.3)
Chloride: 108 mmol/L (ref 101–111)
Creatinine, Ser: 1.78 mg/dL — ABNORMAL HIGH (ref 0.44–1.00)
GFR calc Af Amer: 31 mL/min — ABNORMAL LOW (ref >60)
GFR calc non Af Amer: 26 mL/min — ABNORMAL LOW (ref >60)
Glucose, Bld: 109 mg/dL — ABNORMAL HIGH (ref 65–99)
Potassium: 3.6 mmol/L (ref 3.5–5.1)
Sodium: 141 mmol/L (ref 135–145)

## 2015-05-22 LAB — CBC
HCT: 36.5 % (ref 36.0–46.0)
Hemoglobin: 11.5 g/dL — ABNORMAL LOW (ref 12.0–15.0)
MCH: 27.1 pg (ref 26.0–34.0)
MCHC: 31.5 g/dL (ref 30.0–36.0)
MCV: 85.9 fL (ref 78.0–100.0)
Platelets: 189 10*3/uL (ref 150–400)
RBC: 4.25 MIL/uL (ref 3.87–5.11)
RDW: 14.8 % (ref 11.5–15.5)
WBC: 8.8 10*3/uL (ref 4.0–10.5)

## 2015-05-22 LAB — PROTIME-INR
INR: 2.87 — ABNORMAL HIGH (ref 0.00–1.49)
Prothrombin Time: 29.6 seconds — ABNORMAL HIGH (ref 11.6–15.2)

## 2015-05-22 MED ORDER — MORPHINE SULFATE (PF) 2 MG/ML IV SOLN
2.0000 mg | INTRAVENOUS | Status: DC | PRN
  Administered 2015-05-23 (×2): 2 mg via INTRAVENOUS
  Filled 2015-05-22 (×2): qty 1

## 2015-05-22 MED ORDER — HYDROMORPHONE HCL 1 MG/ML IJ SOLN
INTRAMUSCULAR | Status: AC
  Filled 2015-05-22: qty 1

## 2015-05-22 MED ORDER — LABETALOL HCL 5 MG/ML IV SOLN
10.0000 mg | INTRAVENOUS | Status: DC | PRN
  Administered 2015-05-22: 10 mg via INTRAVENOUS
  Filled 2015-05-22: qty 4

## 2015-05-22 MED ORDER — PANCRELIPASE (LIP-PROT-AMYL) 12000-38000 UNITS PO CPEP: 48000.0000 [IU] | ORAL_CAPSULE | Freq: Three times a day (TID) | ORAL | Status: DC

## 2015-05-22 MED ORDER — MIRABEGRON ER 25 MG PO TB24
25.0000 mg | ORAL_TABLET | Freq: Every day | ORAL | Status: DC
  Administered 2015-05-22 – 2015-05-25 (×4): 25 mg via ORAL
  Filled 2015-05-22 (×5): qty 1

## 2015-05-22 MED ORDER — METOPROLOL TARTRATE 50 MG PO TABS
75.0000 mg | ORAL_TABLET | Freq: Two times a day (BID) | ORAL | Status: DC
  Administered 2015-05-22 – 2015-05-25 (×6): 75 mg via ORAL
  Filled 2015-05-22 (×6): qty 1

## 2015-05-22 MED ORDER — SODIUM CHLORIDE 0.9 % IV SOLN
INTRAVENOUS | Status: DC
  Administered 2015-05-22 – 2015-05-23 (×2): via INTRAVENOUS
  Administered 2015-05-23: 1000 mL via INTRAVENOUS
  Administered 2015-05-24 – 2015-05-25 (×3): via INTRAVENOUS

## 2015-05-22 MED ORDER — DIGOXIN 125 MCG PO TABS
125.0000 ug | ORAL_TABLET | Freq: Every day | ORAL | Status: DC
  Administered 2015-05-22 – 2015-05-25 (×4): 125 ug via ORAL
  Filled 2015-05-22 (×5): qty 1

## 2015-05-22 MED ORDER — HYDROMORPHONE HCL 1 MG/ML IJ SOLN
0.5000 mg | INTRAMUSCULAR | Status: DC | PRN
Start: 2015-05-22 — End: 2015-05-23
  Administered 2015-05-22 – 2015-05-23 (×4): 0.5 mg via INTRAVENOUS
  Filled 2015-05-22 (×3): qty 1

## 2015-05-22 MED ORDER — ACETAMINOPHEN 325 MG PO TABS
650.0000 mg | ORAL_TABLET | Freq: Once | ORAL | Status: AC
  Administered 2015-05-22: 650 mg via ORAL
  Filled 2015-05-22: qty 2

## 2015-05-22 MED ORDER — MORPHINE SULFATE (PF) 4 MG/ML IV SOLN
4.0000 mg | INTRAVENOUS | Status: DC | PRN
  Administered 2015-05-22: 4 mg via INTRAVENOUS
  Filled 2015-05-22: qty 1

## 2015-05-22 MED ORDER — PANTOPRAZOLE SODIUM 40 MG PO TBEC
40.0000 mg | DELAYED_RELEASE_TABLET | Freq: Every day | ORAL | Status: DC
  Administered 2015-05-22 – 2015-05-25 (×4): 40 mg via ORAL
  Filled 2015-05-22 (×4): qty 1

## 2015-05-22 NOTE — Progress Notes (Signed)
Tami MatterSally M [name] [id number] Admission Data: 05/22/2015 5:55 PM Attending Provider: Nestor RampSara L Neal, MD  WUJ:WJXBJ,YNWGNFPCP:Haney,Alyssa, MD Consults/ Treatment Team: Treatment Team:  Sheral Apleyimothy D Murphy, MD  [name] is a 80 y.o. female patient admitted from ED awake, alert  & orientated  X 3,  Full Code, VSS - Blood pressure 156/88, pulse 82, temperature 98.3 F (36.8 C), temperature source Oral, resp. rate 20, height 5\' 7"  (1.702 m), weight 79.969 kg (176 lb 4.8 oz), SpO2 100 %.,no c/o shortness of breath, no c/o chest pain, no distress noted. .   IV site WDL: right hand, condition patent and no redness with a transparent dsg that's clean dry and intact.  Allergies:   Allergies  Allergen Reactions  . Esomeprazole Magnesium     REACTION: stomach upset, nausea     Past Medical History  Diagnosis Date  . Hypertension   . GI bleed   . Chronic atrial fibrillation (HCC)   . Dilated cardiomyopathy (HCC)     history of this, now resolved  . GERD (gastroesophageal reflux disease)   . Dysphagia     Hx  . Nonsustained ventricular tachycardia (HCC)   . Osteoporosis   . Microcytic anemia   . Arrhythmia     chronic afib NSVT  . RBBB (right bundle branch block)   . Heart murmur   . Asthma     years ago  . History of blood transfusion   . Protein in urine   . Hemorrhoid 04/2014    bleeding    History:  obtained from the patient. Tobacco/alcohol: denied none  Pt orientation to unit, room and routine. Information packet given to patient/family and safety video watched.  Admission INP armband ID verified with patient/family, and in place. SR up x 2, fall risk assessment complete with Patient and family verbalizing understanding of risks associated with falls. Pt verbalizes an understanding of how to use the call bell and to call for help before getting out of bed.  Skin, clean-dry- intact without evidence of bruising, or skin tears.   No evidence of skin break down noted on exam. no rashes, no ecchymoses,  no petechiae    Will cont to monitor and assist as needed.  Tami Theissen Consuella Loselaine, RN 05/22/2015 5:55 PM

## 2015-05-22 NOTE — ED Notes (Signed)
Attempted report to floor RN

## 2015-05-22 NOTE — ED Notes (Signed)
Pt reports a mechanical fall where she slipped on water in a bathtub and injured her left knee. Pt denies any other complaint and denies LOC. Pt alert x4. NAD at this time.

## 2015-05-22 NOTE — ED Notes (Signed)
Ortho Tech at bedside with patient. 

## 2015-05-22 NOTE — ED Provider Notes (Signed)
History  By signing my name below, I, Karle Plumber, attest that this documentation has been prepared under the direction and in the presence of Audry Pili, PA-C. Electronically Signed: Karle Plumber, ED Scribe. 05/22/2015. 3:20 PM  Chief Complaint  Patient presents with  . Knee Pain   The history is provided by the patient and medical records. No language interpreter was used.    HPI Comments:  [name] is a 80 y.o. female who presents to the Emergency Department complaining of a mechanical fall that occurred PTA. She reports slipping on water in a bathtub, landing on her left knee. She reports severe, throbbing left knee pain and swelling to the medial left knee. She has not taken anything for the pain. Trying to bear weight or move the joint increases the pain. She denies alleviating factors. She denies head injury, LOC, bruising, wounds, numbness, tingling or weakness of the left knee or LLE. She denies previous surgery to the left knee. She reports being on Warfarin anticoagulant therapy.  Past Medical History  Diagnosis Date  . Hypertension   . GI bleed   . Chronic atrial fibrillation (HCC)   . Dilated cardiomyopathy (HCC)     history of this, now resolved  . GERD (gastroesophageal reflux disease)   . Dysphagia     Hx  . Nonsustained ventricular tachycardia (HCC)   . Osteoporosis   . Microcytic anemia   . Arrhythmia     chronic afib NSVT  . RBBB (right bundle branch block)   . Heart murmur   . Asthma     years ago  . History of blood transfusion   . Protein in urine   . Hemorrhoid 04/2014    bleeding   Past Surgical History  Procedure Laterality Date  . Cholecystectomy    . Mitral valve prosthesis with angioplasty  1987  . Tubal ligation    . Transthoracic echocardiogram  02/2008, 03/2006, 06/2004  . Polyp removal    . Colonoscopy    . Trabeculectomy Right 05/17/2014    Procedure: TRABECULECTOMY WITH MITOMYCIN RIGHT EYE;  Surgeon: Chalmers Guest, MD;   Location: [facility] OR;  Service: Ophthalmology;  Laterality: Right;  . Mitomycin c application Right 05/17/2014    Procedure: MITOMYCIN C APPLICATION;  Surgeon: Chalmers Guest, MD;  Location: [facility] OR;  Service: Ophthalmology;  Laterality: Right;   Family History  Problem Relation Age of Onset  . Hypertension Mother   . Colon cancer Neg Hx   . Cancer Sister     in the Bladder   Social History  Substance Use Topics  . Smoking status: Former Games developer  . Smokeless tobacco: None     Comment: quiit 1987  . Alcohol Use: No   OB History    No data available     Review of Systems  A complete 10 system review of systems was obtained and all systems are negative except as noted in the HPI and PMH.   Allergies  Esomeprazole magnesium  Home Medications   Prior to Admission medications   Medication Sig Start Date End Date Taking? Authorizing Provider  amoxicillin (AMOXIL) 500 MG capsule TAKE 4 CAPSULES BY MOUTH 1 HOUR PRIOR TO DENTAL WORK 04/27/14   Marinus Maw, MD  Brinzolamide-Brimonidine 1-0.2 % SUSP Place 1 drop into the right eye 2 (two) times daily.     Historical Provider, MD  CARTIA XT 180 MG 24 hr capsule TAKE 1 CAPSULE BY MOUTH DAILY 05/15/15   Marinus Maw,  MD  Cholecalciferol (VITAMIN D) 2000 UNITS tablet Take 2,000 Units by mouth daily.    Historical Provider, MD  DIGOX 125 MCG tablet TAKE 1 TABLET BY MOUTH EVERY DAY 03/07/15   Marinus MawGregg W Taylor, MD  lipase/protease/amylase (CREON) 12000 units CPEP capsule Take 48,000 Units by mouth 3 (three) times daily before meals.    Historical Provider, MD  metoprolol (LOPRESSOR) 50 MG tablet TAKE 1 1/2 TABLETS BY MOUTH TWICE DAILY Patient taking differently: TAKE 1.5 TABLETS (75mg ) BY MOUTH TWICE DAILY 03/14/15   Marinus MawGregg W Taylor, MD  mirabegron ER (MYRBETRIQ) 25 MG TB24 tablet Take 25 mg by mouth daily.    Historical Provider, MD  olmesartan-hydrochlorothiazide (BENICAR HCT) 40-25 MG tablet Take 1 tablet by mouth daily. 12/22/14   Historical Provider, MD   pantoprazole (PROTONIX) 40 MG tablet Take 40 mg by mouth daily.  08/10/12   Historical Provider, MD  TRAVATAN Z 0.004 % SOLN ophthalmic solution Place 2 drops into the right eye at bedtime.  04/18/14   Historical Provider, MD  warfarin (COUMADIN) 5 MG tablet TAKE AS DIRECTED BY COUMADIN CLINIC 04/20/15   Marinus MawGregg W Taylor, MD   Triage Vitals: BP 178/91 mmHg  Pulse 118  Temp(Src) 98.5 F (36.9 C) (Oral)  Resp 18  SpO2 100%  Physical Exam  Constitutional: She is oriented to person, place, and time. She appears well-developed and well-nourished.  HENT:  Head: Normocephalic and atraumatic.  Eyes: EOM are normal.  Neck: Normal range of motion.  Cardiovascular: Normal rate.   Pulmonary/Chest: Effort normal.  Musculoskeletal: Normal range of motion.  Negative anterior/poster drawer bilaterally. Negative ballottement test. No varus or valgus laxity. No crepitus. Pain with flexion and extension. TTP anterior knee. Compartments soft. Distal pulses intact. Cap refill <2sec. Motor/sensation intact.    Neurological: She is alert and oriented to person, place, and time.  Skin: Skin is warm and dry.  Psychiatric: She has a normal mood and affect. Her behavior is normal.  Nursing note and vitals reviewed.  ED Course  Procedures (including critical care time) DIAGNOSTIC STUDIES: Oxygen Saturation is 100% on RA, normal by my interpretation.   COORDINATION OF CARE: 12:56 PM- Will wait for imaging to result. Pt verbalizes understanding and agrees to plan.  Medications  acetaminophen (TYLENOL) tablet 650 mg (650 mg Oral Given 05/22/15 1333)    Labs Review Labs Reviewed  CBC  BASIC METABOLIC PANEL  PROTIME-INR    Imaging Review Ct Knee Left Wo Contrast  05/22/2015  CLINICAL DATA:  Left knee tibial plateau fracture. Fall in the shower. EXAM: CT OF THE left KNEE WITHOUT CONTRAST TECHNIQUE: Multidetector CT imaging of the left knee was performed according to the standard protocol. Multiplanar CT image  reconstructions were also generated. COMPARISON:  05/22/2015 FINDINGS: Schatzker II fracture of the medial tibial plateau, involving the anterior half of the plateau, with 2.5 mm depression of the involved fragment. The fracture exits anteromedially at about the level of the fused growth plate. Medial compartment old degenerative articular space narrowing with mild subcortical sclerosis due to osteoarthritis. Mild lateral compartmental articular space narrowing. Lipohemarthrosis.  No other fracture observed. No additional significant findings. IMPRESSION: 1. Schatzker II fracture head involving the anterior half of the medial tibial plateau with about 2.5 mm of depression of the involved fragment. 2. Underlying mild to moderate osteoarthritis especially in the medial compartment. 3. Lipohemarthrosis. Electronically Signed   By: Gaylyn RongWalter  Liebkemann M.D.   On: 05/22/2015 15:06   Dg Knee Complete 4 Views Left  05/22/2015  CLINICAL DATA:  Pain following fall EXAM: LEFT KNEE - COMPLETE 4+ VIEW COMPARISON:  None. FINDINGS: Frontal, lateral, and bilateral oblique views were obtained. There is a fracture of the medial tibial plateau with slight displacement of fracture fragments. Along the anterior aspect of the medial tibial plateau, there is 2 mm of depression. There is a sizable joint effusion. No other fractures are evident. No dislocation. There is mild narrowing in the patellofemoral joint region in moderate narrowing medially. No erosive change. IMPRESSION: Medial tibial plateau fracture with 2 mm of depression anteriorly and slight separation of fracture fragments in the medial tibial plateau region. Sizable joint effusion. Moderate osteoarthritic change. No dislocation. Electronically Signed   By: Bretta Bang III M.D.   On: 05/22/2015 12:55   I have personally reviewed and evaluated these images and lab results as part of my medical decision-making.   EKG Interpretation None      MDM  I have  reviewed and evaluated the relevant imaging studies.  I have reviewed the relevant previous healthcare records. I obtained HPI from historian. Patient discussed with supervising physician  ED Course:  Assessment: Pt is a 79yF who presents with left knee pain s/p fall. On exam, pt in NAD. Nontoxic/nonseptic appearing. VSS. Afebrile. Limited ROM of left knee. Neurovascularly intact. Compartments soft. XR shows medial tibial plateau fracture with 2mm depression anteriorly. Pt unable to ambulate with crutches. Consult with Ortho (Dr. Wandra Feinstein) requested CT, Knee Immobilizer and admission to medicine.    Disposition/Plan:  Admit to medicine Pt acknowledges and agrees with plan  Supervising Physician Jacalyn Lefevre, MD   Final diagnoses:  Tibial plateau fracture, left, closed, initial encounter    I personally performed the services described in this documentation, which was scribed in my presence. The recorded information has been reviewed and is accurate.     Audry Pili, PA-C 05/22/15 1537

## 2015-05-22 NOTE — ED Notes (Signed)
Attempted to ambulate the patient.  Patient was able to take a couple of steps but stopped and states "I feel so weak" and could no longer ambulate further.  Patient was assisted to a wheelchair by this RN.  Family at bedside.

## 2015-05-22 NOTE — H&P (Signed)
Family Medicine Teaching [facility] Admission History and Physical Service Pager: [phone]  [name] Medical record number: [id number] Date of birth: May 05, 1935 Age: 80 y.o. Gender: female  Primary Care Provider: Velora Heckler, MD Consultants: Orthopedics Code Status: Full  Chief Complaint: Larey Seat in The Shower Leg Hurting.   Assessment and Plan: [name] is a 80 y.o. female presenting with Left tibial plateau fracture. PMH is significant for chronic atrial fibrillation, GERD, Osteoporosis, S/P Mitral Replacement, Chronic Anticoagulation.   # Left Tibial Plateau Fracture - Displacement 2.61mm on CT scan. Immediately unable to bear weight.  - Admit to Med-Surg.  - pain control with morphine tonight.  - Knee immobilizer in place.  - Elevation as able.  - Ortho consulted and will see in the am.  - Holding coumadin for now.  - Place on Heparin GTT if needing surgery.  - INR today.  - INR / CBC / BMET in the am.  - NPO after midnight.   # AKI on CKD - Cr 1.78 above bl of 1.44. She may be somewhat dry.  - Will give her MIVF overnight.  - Trend BMET  # Atrial Fibrillation S/P Mitral Replacement - chronically in afib. Rate controlled. Anticoagulated with coumadin. Follows with cards.  - Continue Digox daily - Lopressor 50mg  BID  # GERD  - Protonix 40mg  daily.   FEN/GI: MIVF.  Prophylaxis: Anticoagulated.   Disposition: Pending further evaluation.   History of Present Illness:  [name] is a 80 y.o. female presenting with left tibial plateau fracture. She fell in the shower earlier today. She says she slipped and fell forward onto her knee. She had immediate pain and swelling. She initially could ambulate a little, but this worsened shortly as her pain became worse. She could not flex her knee immediately after the incident either. She had to get a neighbor to help her put her shoes on and get in her car. She drove herself to the ED.   In  the ED, X-ray revealed anterior tibial plateau fracture of the medial compartment on the left. Lab work was otherwise unremarkable except for a mild episode of renal insufficiency. She also received a CT scan which demonstrated the same fracture and 2.40mm depressed anterior tibial segment.   Ortho was called in the ED who recommended admission to medicine, knee immobilization and observation overnight as she was unable to ambulate and follow up as an outpatient. Additionally, she was in too much pain to go home.   Review Of Systems: Per HPI with the following additions: None.  Otherwise the remainder of the systems were negative.  Patient Active Problem List   Diagnosis Date Noted  . Tibial plateau fracture 05/22/2015  . Healthcare maintenance 04/30/2015  . Encounter for therapeutic drug monitoring 02/15/2013  . Long term (current) use of anticoagulants 05/03/2010  . URINARY FREQUENCY 05/24/2008  . RBBB 02/16/2008  . VENTRICULAR TACHYCARDIA 02/16/2008  . PROSTHETIC VALVE-MECHANICAL 02/16/2008  . ANEMIA, IRON DEFICIENCY, UNSPEC. 03/19/2006  . HYPERTENSION, BENIGN SYSTEMIC 03/19/2006  . ATRIAL FIBRILLATION 03/19/2006  . RHINITIS, ALLERGIC 03/19/2006  . REFLUX ESOPHAGITIS 03/19/2006  . OSTEOARTHRITIS, LOWER LEG 03/19/2006  . OSTEOPOROSIS, UNSPECIFIED 03/19/2006    Past Medical History: Past Medical History  Diagnosis Date  . Hypertension   . GI bleed   . Chronic atrial fibrillation (HCC)   . Dilated cardiomyopathy (HCC)     history of this, now resolved  . GERD (gastroesophageal reflux disease)   . Dysphagia  Hx  . Nonsustained ventricular tachycardia (HCC)   . Osteoporosis   . Microcytic anemia   . Arrhythmia     chronic afib NSVT  . RBBB (right bundle branch block)   . Heart murmur   . Asthma     years ago  . History of blood transfusion   . Protein in urine   . Hemorrhoid 04/2014    bleeding    Past Surgical History: Past Surgical History  Procedure Laterality  Date  . Cholecystectomy    . Mitral valve prosthesis with angioplasty  1987  . Tubal ligation    . Transthoracic echocardiogram  02/2008, 03/2006, 06/2004  . Polyp removal    . Colonoscopy    . Trabeculectomy Right 05/17/2014    Procedure: TRABECULECTOMY WITH MITOMYCIN RIGHT EYE;  Surgeon: Chalmers Guestoy Whitaker, MD;  [facility];  Service: Ophthalmology;  Laterality: Right;  . Mitomycin c application Right 05/17/2014    Procedure: MITOMYCIN C APPLICATION;  Surgeon: Chalmers Guestoy Whitaker, MD;  [facility];  Service: Ophthalmology;  Laterality: Right;    Social History: Social History  Substance Use Topics  . Smoking status: Former Games developermoker  . Smokeless tobacco: None     Comment: quiit 1987  . Alcohol Use: No   Additional social history: None.   Please also refer to relevant sections of EMR.  Family History: Family History  Problem Relation Age of Onset  . Hypertension Mother   . Colon cancer Neg Hx   . Cancer Sister     in the Bladder   (If not completed, MUST add something in)  Allergies and Medications: Allergies  Allergen Reactions  . Esomeprazole Magnesium     REACTION: stomach upset, nausea   Current Facility-Administered Medications on File Prior to Encounter  Medication Dose Route Frequency Provider Last Rate Last Dose  . mitoMYcin (MUTAMYCIN) Injection Use in OR only (0.4 mg/ml)  0.5 mL Right Eye Once Chalmers Guestoy Whitaker, MD       Current Outpatient Prescriptions on File Prior to Encounter  Medication Sig Dispense Refill  . amoxicillin (AMOXIL) 500 MG capsule TAKE 4 CAPSULES BY MOUTH 1 HOUR PRIOR TO DENTAL WORK 4 capsule 0  . Brinzolamide-Brimonidine 1-0.2 % SUSP Place 1 drop into the left eye 3 (three) times daily.     Marland Kitchen. CARTIA XT 180 MG 24 hr capsule TAKE 1 CAPSULE BY MOUTH DAILY 90 capsule 2  . Cholecalciferol (VITAMIN D) 2000 UNITS tablet Take 2,000 Units by mouth daily.    Marland Kitchen. DIGOX 125 MCG tablet TAKE 1 TABLET BY MOUTH EVERY DAY 90 tablet 3  . lipase/protease/amylase (CREON)  12000 units CPEP capsule Take 48,000 Units by mouth 3 (three) times daily before meals.    . metoprolol (LOPRESSOR) 50 MG tablet TAKE 1 1/2 TABLETS BY MOUTH TWICE DAILY (Patient taking differently: TAKE 1.5 TABLETS (75mg ) BY MOUTH TWICE DAILY) 90 tablet 10  . mirabegron ER (MYRBETRIQ) 25 MG TB24 tablet Take 25 mg by mouth daily.    . pantoprazole (PROTONIX) 40 MG tablet Take 40 mg by mouth daily.     . TRAVATAN Z 0.004 % SOLN ophthalmic solution Place 1 drop into both eyes at bedtime.   12    Objective: BP 177/89 mmHg  Pulse 97  Temp(Src) 98.4 F (36.9 C) (Oral)  Resp 18  Ht 5\' 7"  (1.702 m)  SpO2 97% Exam: General: NAD, AAOx3 Eyes: EOMI, PERRLA ENTM: Nares patent, O/P clear without erythema.  Neck: FROM, Supple.  Cardiovascular: RRR, No  MGR, Normal S1/S2 Respiratory: CTA BL Abdomen: S, NT, ND, +BS.  MSK:  Left Knee with knee immobilizer in place.  - Limited exam due to pain. TTP over the entire moint, and down to the mid - calf.  - Unable to flex the knee due to pain. Minimal extension.  - Swelling noted.  - Able to lift the leg / bend the leg at the hip without pain.  - No bruising or deformity noted around the left ankle.  - Pulses intact DP, and PT distally 2+  - Sensation was fully in tact on the left in all major dermatomes.  Right Leg: - FROM, Knee, Hip, ankle.  - No bruising, nontender to palpation.  - 2+ distal pulses.   Skin: No rashes, no lesions.  Neuro: Sensory and motor grossly intact, unable to test motor of left leg due to pain.  Psych: Appropriate mood / affect.   Labs and Imaging: CBC BMET   Recent Labs Lab 05/22/15 1507  WBC 8.8  HGB 11.5*  HCT 36.5  PLT 189    Recent Labs Lab 05/22/15 1507  NA 141  K 3.6  CL 108  CO2 23  BUN 26*  CREATININE 1.78*  GLUCOSE 109*  CALCIUM 9.7       Yolande Jolly, MD 05/22/2015, 4:21 PM PGY-2, [facility] FPTS Intern pager: [phone], text pages welcome

## 2015-05-22 NOTE — Progress Notes (Signed)
ANTICOAGULATION CONSULT NOTE - Initial Consult  Pharmacy Consult for Heparin Indication: atrial fibrillation  Allergies  Allergen Reactions  . Esomeprazole Magnesium     REACTION: stomach upset, nausea    Patient Measurements: Height:  (170.2 cm) Weight: 176 lb 4.8 oz (79.969 kg) IBW/kg (Calculated) : 61.6  Vital Signs: Temp: 97.9 F (36.6 C) (05/02 2137) Temp Source: Oral (05/02 1719) BP: 183/85 mmHg (05/02 2137) Pulse Rate: 70 (05/02 2137)  Labs:  Recent Labs  05/22/15 1507 05/22/15 1714  HGB 11.5*  --   HCT 36.5  --   PLT 189  --   LABPROT  --  29.6*  INR  --  2.87*  CREATININE 1.78*  --     Estimated Creatinine Clearance: 28.8 mL/min (by C-G formula based on Cr of 1.78).   Medical History: Past Medical History  Diagnosis Date  . Hypertension   . GI bleed   . Chronic atrial fibrillation (HCC)   . Dilated cardiomyopathy (HCC)     history of this, now resolved  . GERD (gastroesophageal reflux disease)   . Dysphagia     Hx  . Nonsustained ventricular tachycardia (HCC)   . Osteoporosis   . Microcytic anemia   . Arrhythmia     chronic afib NSVT  . RBBB (right bundle branch block)   . Heart murmur   . Asthma     years ago  . History of blood transfusion   . Protein in urine   . Hemorrhoid 04/2014    bleeding  . Fracture of tibial plateau 05/21/2015    Medications:  Prescriptions prior to admission  Medication Sig Dispense Refill Last Dose  . amoxicillin (AMOXIL) 500 MG capsule TAKE 4 CAPSULES BY MOUTH 1 HOUR PRIOR TO DENTAL WORK 4 capsule 0 PRN  . Brinzolamide-Brimonidine 1-0.2 % SUSP Place 1 drop into the left eye 3 (three) times daily.    05/21/2015 at Unknown time  . CARTIA XT 180 MG 24 hr capsule TAKE 1 CAPSULE BY MOUTH DAILY 90 capsule 2 05/21/2015 at Unknown time  . Cholecalciferol (VITAMIN D) 2000 UNITS tablet Take 2,000 Units by mouth daily.   05/21/2015 at Unknown time  . DIGOX 125 MCG tablet TAKE 1 TABLET BY MOUTH EVERY DAY 90 tablet 3  05/21/2015 at Unknown time  . lipase/protease/amylase (CREON) 12000 units CPEP capsule Take 48,000 Units by mouth 3 (three) times daily before meals.   Past Month at Unknown time  . metoprolol (LOPRESSOR) 50 MG tablet TAKE 1 1/2 TABLETS BY MOUTH TWICE DAILY (Patient taking differently: TAKE 1.5 TABLETS ( ) BY MOUTH TWICE DAILY) 90 tablet 10 05/21/2015 at pm  . mirabegron ER (MYRBETRIQ) 25 MG TB24 tablet Take 25 mg by mouth daily.   05/21/2015 at Unknown time  . pantoprazole (PROTONIX) 40 MG tablet Take 40 mg by mouth daily.    05/21/2015 at Unknown time  . TRAVATAN Z 0.004 % SOLN ophthalmic solution Place 1 drop into both eyes at bedtime.   12 05/21/2015 at Unknown time  . warfarin (COUMADIN) 5 MG tablet Take 5-7.5 mg by mouth daily. Take 5 mg on Sun / Tues / Wed / Fri / Sat Take 7.5 mg on Mon / Thurs   05/21/2015 at Unknown time    Assessment: 80 y.o. female admitted with L tibial fracture, Coumadin on hold while awaiting surgery, for heparin  Goal of Therapy:  Heparin level 0.3-0.7 units/ml Monitor platelets by anticoagulation protocol: Yes   Plan:  Hold Heparin for now  and start once INR < 2 Daily INR  Jakeia Carreras, Gary FleetGregory Vernon 05/22/2015,10:36 PM

## 2015-05-22 NOTE — Progress Notes (Signed)
Orthopedic Tech Progress Note Patient Details:  [name] 1935/05/12 [id number]  Ortho Devices Type of Ortho Device: Crutches, Knee Immobilizer Ortho Device/Splint Interventions: Application   Saul FordyceJennifer C Deaveon Schoen 05/22/2015, 2:07 PM

## 2015-05-23 DIAGNOSIS — S82143D Displaced bicondylar fracture of unspecified tibia, subsequent encounter for closed fracture with routine healing: Secondary | ICD-10-CM

## 2015-05-23 DIAGNOSIS — S82142A Displaced bicondylar fracture of left tibia, initial encounter for closed fracture: Secondary | ICD-10-CM | POA: Diagnosis not present

## 2015-05-23 LAB — CBC
HCT: 33 % — ABNORMAL LOW (ref 36.0–46.0)
Hemoglobin: 10.3 g/dL — ABNORMAL LOW (ref 12.0–15.0)
MCH: 27.5 pg (ref 26.0–34.0)
MCHC: 31.2 g/dL (ref 30.0–36.0)
MCV: 88.2 fL (ref 78.0–100.0)
Platelets: 151 10*3/uL (ref 150–400)
RBC: 3.74 MIL/uL — ABNORMAL LOW (ref 3.87–5.11)
RDW: 14.9 % (ref 11.5–15.5)
WBC: 6.2 10*3/uL (ref 4.0–10.5)

## 2015-05-23 LAB — DIGOXIN LEVEL
Digoxin Level: 1.2 ng/mL (ref 0.8–2.0)
Digoxin Level: 1.3 ng/mL (ref 0.8–2.0)

## 2015-05-23 LAB — BASIC METABOLIC PANEL
Anion gap: 10 (ref 5–15)
BUN: 18 mg/dL (ref 6–20)
CO2: 22 mmol/L (ref 22–32)
Calcium: 8.9 mg/dL (ref 8.9–10.3)
Chloride: 108 mmol/L (ref 101–111)
Creatinine, Ser: 1.42 mg/dL — ABNORMAL HIGH (ref 0.44–1.00)
GFR calc Af Amer: 40 mL/min — ABNORMAL LOW (ref >60)
GFR calc non Af Amer: 35 mL/min — ABNORMAL LOW (ref >60)
Glucose, Bld: 112 mg/dL — ABNORMAL HIGH (ref 65–99)
Potassium: 3.8 mmol/L (ref 3.5–5.1)
Sodium: 140 mmol/L (ref 135–145)

## 2015-05-23 LAB — GLUCOSE, CAPILLARY: Glucose-Capillary: 97 mg/dL (ref 65–99)

## 2015-05-23 MED ORDER — BRINZOLAMIDE-BRIMONIDINE 1-0.2 % OP SUSP
1.0000 [drp] | Freq: Three times a day (TID) | OPHTHALMIC | Status: DC
  Administered 2015-05-23 – 2015-05-25 (×7): 1 [drp] via OPHTHALMIC

## 2015-05-23 MED ORDER — WARFARIN - PHARMACIST DOSING INPATIENT: Freq: Every day | Status: DC

## 2015-05-23 MED ORDER — POLYETHYLENE GLYCOL 3350 17 G PO PACK
17.0000 g | PACK | Freq: Every day | ORAL | Status: DC
  Administered 2015-05-24: 17 g via ORAL
  Filled 2015-05-23: qty 1

## 2015-05-23 MED ORDER — OXYCODONE HCL 5 MG PO TABS
5.0000 mg | ORAL_TABLET | ORAL | Status: DC
  Administered 2015-05-23 – 2015-05-24 (×3): 5 mg via ORAL
  Filled 2015-05-23 (×4): qty 1

## 2015-05-23 MED ORDER — PROMETHAZINE HCL 25 MG PO TABS
25.0000 mg | ORAL_TABLET | Freq: Four times a day (QID) | ORAL | Status: DC | PRN
  Filled 2015-05-23: qty 1

## 2015-05-23 MED ORDER — HYDROMORPHONE HCL 1 MG/ML IJ SOLN
1.0000 mg | INTRAMUSCULAR | Status: DC | PRN
  Administered 2015-05-23: 1 mg via INTRAVENOUS
  Filled 2015-05-23 (×2): qty 1

## 2015-05-23 MED ORDER — WARFARIN SODIUM 7.5 MG PO TABS
7.5000 mg | ORAL_TABLET | Freq: Once | ORAL | Status: AC
  Administered 2015-05-23: 7.5 mg via ORAL
  Filled 2015-05-23: qty 1

## 2015-05-23 MED ORDER — NON FORMULARY
1.0000 [drp] | Freq: Three times a day (TID) | Status: DC
  Administered 2015-05-23: 1 [drp] via OPHTHALMIC

## 2015-05-23 MED ORDER — MORPHINE SULFATE (PF) 2 MG/ML IV SOLN
2.0000 mg | INTRAVENOUS | Status: DC | PRN
  Administered 2015-05-23 – 2015-05-24 (×2): 2 mg via INTRAVENOUS
  Filled 2015-05-23 (×2): qty 1

## 2015-05-23 NOTE — Progress Notes (Addendum)
ANTICOAGULATION CONSULT NOTE  Pharmacy Consult for Warfarin Indication: atrial fibrillation/mechanical MVR  Allergies  Allergen Reactions  . Esomeprazole Magnesium     REACTION: stomach upset, nausea    Patient Measurements: Height: 5\' 7"  (170.2 cm) Weight: 176 lb 4.8 oz (79.969 kg) IBW/kg (Calculated) : 61.6  Vital Signs: Temp: 98.2 F (36.8 C) (05/03 0540) BP: 131/65 mmHg (05/03 0540) Pulse Rate: 61 (05/03 0540)  Labs:  Recent Labs  05/22/15 1507 05/22/15 1714 05/23/15 0552  HGB 11.5*  --  10.3*  HCT 36.5  --  33.0*  PLT 189  --  151  LABPROT  --  29.6*  --   INR  --  2.87*  --   CREATININE 1.78*  --  1.42*    Estimated Creatinine Clearance: 36.1 mL/min (by C-G formula based on Cr of 1.42).   Medical History: Past Medical History  Diagnosis Date  . Hypertension   . GI bleed   . Chronic atrial fibrillation (HCC)   . Dilated cardiomyopathy (HCC)     history of this, now resolved  . GERD (gastroesophageal reflux disease)   . Dysphagia     Hx  . Nonsustained ventricular tachycardia (HCC)   . Osteoporosis   . Microcytic anemia   . Arrhythmia     chronic afib NSVT  . RBBB (right bundle branch block)   . Heart murmur   . Asthma     years ago  . History of blood transfusion   . Protein in urine   . Hemorrhoid 04/2014    bleeding  . Fracture of tibial plateau 05/21/2015    Medications:  Prescriptions prior to admission  Medication Sig Dispense Refill Last Dose  . amoxicillin (AMOXIL) 500 MG capsule TAKE 4 CAPSULES BY MOUTH 1 HOUR PRIOR TO DENTAL WORK 4 capsule 0 PRN  . Brinzolamide-Brimonidine 1-0.2 % SUSP Place 1 drop into the left eye 3 (three) times daily.    05/21/2015 at Unknown time  . CARTIA XT 180 MG 24 hr capsule TAKE 1 CAPSULE BY MOUTH DAILY 90 capsule 2 05/21/2015 at Unknown time  . Cholecalciferol (VITAMIN D) 2000 UNITS tablet Take 2,000 Units by mouth daily.   05/21/2015 at Unknown time  . DIGOX 125 MCG tablet TAKE 1 TABLET BY MOUTH EVERY DAY  90 tablet 3 05/21/2015 at Unknown time  . lipase/protease/amylase (CREON) 12000 units CPEP capsule Take 48,000 Units by mouth 3 (three) times daily before meals.   Past Month at Unknown time  . metoprolol (LOPRESSOR) 50 MG tablet TAKE 1 1/2 TABLETS BY MOUTH TWICE DAILY (Patient taking differently: TAKE 1.5 TABLETS (75mg ) BY MOUTH TWICE DAILY) 90 tablet 10 05/21/2015 at pm  . mirabegron ER (MYRBETRIQ) 25 MG TB24 tablet Take 25 mg by mouth daily.   05/21/2015 at Unknown time  . pantoprazole (PROTONIX) 40 MG tablet Take 40 mg by mouth daily.    05/21/2015 at Unknown time  . TRAVATAN Z 0.004 % SOLN ophthalmic solution Place 1 drop into both eyes at bedtime.   12 05/21/2015 at Unknown time  . warfarin (COUMADIN) 5 MG tablet Take 5-7.5 mg by mouth daily. Take 5 mg on Sun / Tues / Wed / Fri / Sat Take 7.5 mg on Mon / Thurs   05/21/2015 at Unknown time    Assessment: 80 y.o. female admitted with L tibial fracture, Coumadin for afib/mechanical MVR on hold while awaiting surgery. Pharmacy consulted to dose heparin when INR<2. INR therapeutic 2.87 this AM.  ADDENDUM: Pharmacy consulted to  transition back to warfarin on 5/3 since no surgery planned. Hg down 10.3, plt wnl, no bleed documented. Goal INR 2.5-3.5  PTA warfarin dose:  daily except 7.5mg  on MF per last outpatient [facility] note from 4/10 (last dose 5/1 pta)  Goal of Therapy:  INR 2.5-3.5  Monitor platelets by anticoagulation protocol: Yes   Plan:  D/c heparin protocol as INR is therapeutic Warfarin 7.5mg  x1 dose tonight Daily INR Monitor s/sx bleeding   Babs Bertin, PharmD, BCPS Clinical Pharmacist Pager [phone] 05/23/2015 8:49 AM

## 2015-05-23 NOTE — Care Management Note (Addendum)
Case Management Note  Patient Details  Name: Bing MatterSally M Vizzini MRN: [id number] Date of Birth: 21-Nov-1935  Subjective/Objective:                 Patient lives at home alone, independent PTA. Patient states she drove and performed all ADLs independently PTA, but had accidental fall. She fractured tibial plateau. Coumadin on hold with hep gtt running, ortho to eval for sx, awaiting rec. Patient does not have DME at home.    Action/Plan:  Watch for DME and HH needs. Awaiting to hear from ortho if pt will have surgery.   Addendum 05/24/15 Patient to have WC and tub bench [facility]. [facility] as she stated she had them in the past and would like them again.Patient stated that she will stay with her daughter after discharge.   *Insurance will cover WC or walker. Pt is NWB LLE, will go home with WC and family at bedside stated they will obtain walker as she progresses. HHA cannot be provided w/o RN, no need for RN at this time.   Expected Discharge Date:                  Expected Discharge Plan:  Home w Home Health Services  In-House Referral:     Discharge planning Services  CM Consult  Post Acute Care Choice:    Choice offered to:     DME Arranged:    DME Agency:     HH Arranged:    HH Agency:     Status of Service:  In process, will continue to follow  Medicare Important Message Given:    Date Medicare IM Given:    Medicare IM give by:    Date Additional Medicare IM Given:    Additional Medicare Important Message give by:     If discussed at Long Length of Stay Meetings, dates discussed:    Additional Comments:  Lawerance SabalDebbie Teagen Mcleary, RN 05/23/2015, 11:55 AM

## 2015-05-23 NOTE — Progress Notes (Signed)
FPTS Interim Progress Note  S: [name]' pain was uncontrolled with 8-9/10 pain this afternoon. She had PRN dilaudid but did not request pain medication. Additionally, she reports nausea and vomiting following her dilaudid dose today. She does report only dizziness but no nausea after her morphine dose yesterday.    O: BP 149/77 mmHg  Pulse 73  Temp(Src) 98.2 F (36.8 C) (Oral)  Resp 20  Ht 5\' 7"  (1.702 m)  Wt 79.969 kg (176 lb 4.8 oz)  BMI 27.61 kg/m2  SpO2 94%    A/P: We have calculated her opiod equivalent thus far and switched her PRN dilaudid to scheduled oxycodone IR 5 mg Q4 hours while awake.  Since optimal pain control has not yet been achieved, we have also added morphine PRN for breakthrough pain.  Night team will reassess her pain.  We will recalculate her pain medication requirement in the morning, and re-dose as appropriate.   Tami ReeseMonique [name], Med Student pager: [phone] 05/23/2015, 3:20 PM MS4, [facility] pager [phone]  I have separately seen and examined the patient. I have discussed the findings and exam with Student Dr Tami FullingAraujo and agree with the above note.  My changes/additions are outlined in BLUE.   [name] is a 80 y.o. female that is hospitalized for tibial plateau fracture.  Patient apparently has not been asking for pain medications, though she is in uncontrolled pain.  Dilaudid causing nausea/ vomiting.  Discontinued this medication.  She asks that we schedule medication for her.  Think this is reasonable.  Total opioid need converts to roughly 37mg  Oxycodone daily.  A patient's pain uncontrolled with this amount, reduced total need to only 30mg  total daily to be divided q4 hours WHILE AWAKE.  Morphine 2mg  added PRN breakthrough pain until we can get a better idea of patient's actual pain medication needs.  Dr Alanda SlimGonfa reassess this afternoon, if controlled will leave at current dosing.  If uncontrolled, could consider increasing to  10mg .  Given her age, would err on the side of caution with sedating medications.  Promethazine also added for nausea/ vomiting.  Additionally per ortho's note, will order PT.  Tami Looman M. Nadine CountsGottschalk, DO PGY-2, [facility]

## 2015-05-23 NOTE — Progress Notes (Signed)
Family Medicine Teaching Service Daily Progress Note Intern Pager: [phone]  [name] Medical record number: [id number] Date of birth: [birth year] Age: 80 y.o. Gender: female  Primary Care Provider: Denny Levy, MD Consultants: Ortho Code Status: Full  Pt Overview and Major Events to Date:  Left tibial plateau fracture   Assessment and Plan: [name] is a 80 y.o. female admitted on 5/2 with Left tibial plateau fracture. PMH is significant for chronic atrial fibrillation, GERD, Osteoporosis, S/P Mitral Replacement, Chronic Anticoagulation.   # Left Tibial Plateau Fracture - Displacement 2.71mm on CT scan. Immediately unable to bear weight.  - Pain was not well controlled overnight with pain of 7/10 in the morning. Will switch from Morphine to Dilaudid 1 mg Q 3 hrs PRN.  - Pain plan will be to eventually transition her to tramadol for outpatient pain control  - Given level of pain and patient description of tightness, monitor for compartment syndrome by assessing pain and checking LLE pulses routinely.  - Ortho saw her this morning (5/3) and will proceed with non-op tx and outpatient f/u in 2 wks.  - Appreciate ortho recommendations and follow-up. - Given non operative tx will restart coumadin and hold heparin GTT - Knee immobilizer in place.  - Elevation as able. - D/c NPO. Start heart healthy diet - Miralax scheduled  # AKI on CKD - Cr 1.78 above bl of 1.44. She may be somewhat dry.  - Cr today 1.42 back to bl - If not tolerating fluids, start mIVF - Continue to trend BMET  # Atrial Fibrillation S/P Mitral Replacement - chronically in afib. Rate controlled. Anticoagulated with coumadin. Follows with cards.  - Continue Digox daily - Obtain Dig level today (goal of <2) - Lopressor  BID  # GERD  - Protonix  daily.   FEN/GI: MIVF. Advance diet as tolerated. Prophylaxis: Anticoagulated.   Disposition: Pending further evaluation.    Review Of Systems: Per HPI with the following additions: None.  Otherwise the remainder of the systems were negative.  Subjective:  She slept through the night. No acute overnight events. Her pain is not well controlled, being at a 8-9/10 this morning and going down to 6-7/10 with IV morphine. She endorses nausea following her IV morphine last night.  She describes a feeling of tightness in her leg immediately below the knee.  She also endorses sweats and having to have sheets and pillow changed from sweating.  Denies SOB, CP or pressure, HA, heart flutter.   Objective: Temp:  [97.9 F (36.6 C)-98.5 F (36.9 C)] 98.2 F (36.8 C) (05/03 0540) Pulse Rate:  [50-118] 61 (05/03 0540) Resp:  [16-20] 16 (05/03 0540) BP: (131-183)/(65-117) 131/65 mmHg (05/03 0540) SpO2:  [96 %-100 %] 100 % (05/03 0540) Weight:  [79.969 kg (176 lb 4.8 oz)] 79.969 kg (176 lb 4.8 oz) (05/02 1719)   Physical Exam: General: Hard of hearing- wear hearing aid on R ear.  Cardiovascular: Clicking sound heard peristernal bilaterally, but best at lower left sternal border. No other murmurs or gallops appreciated on exam. No S3 or S4 appreciated. PMI non-displaced.  Respiratory: CTA, non-labored breath with symmetric BS bilaterally  MSK: Swelling above and below the knee on left leg. Swelling below knee>above. Warmth and redness appreciated below the knee. Also a contained soft lump on lateral aspect of lower leg is noted and painful. Area of fracture was very tender to palpation, but sensation was intact throughout including lower leg and foot distally  to the injury. +2 dorsalis pedis and posterior tibialis pulses on L leg distally from injury .  Extremities: No pitting edema in either leg. +2 dorsalis pedis pulses bilaterally.   Laboratory:  Recent Labs Lab 05/22/15 1507 05/23/15 0552  WBC 8.8 6.2  HGB 11.5* 10.3*  HCT 36.5 33.0*  PLT 189 151    Recent Labs Lab 05/22/15 1507 05/23/15 0552  NA 141 140  K  3.6 3.8  CL 108 108  CO2 23 22  BUN 26* 18  CREATININE 1.78* 1.42*  CALCIUM 9.7 8.9  GLUCOSE 109* 112*   INR 2.8, PT 29.6   Imaging/Diagnostic Tests: Ct Knee Left Wo Contrast  05/22/2015  CLINICAL DATA:  Left knee tibial plateau fracture. Fall in the shower. EXAM: CT OF THE left KNEE WITHOUT CONTRAST TECHNIQUE: Multidetector CT imaging of the left knee was performed according to the standard protocol. Multiplanar CT image reconstructions were also generated. COMPARISON:  05/22/2015 FINDINGS: Schatzker II fracture of the medial tibial plateau, involving the anterior half of the plateau, with 2.5 mm depression of the involved fragment. The fracture exits anteromedially at about the level of the fused growth plate. Medial compartment old degenerative articular space narrowing with mild subcortical sclerosis due to osteoarthritis. Mild lateral compartmental articular space narrowing. Lipohemarthrosis.  No other fracture observed. No additional significant findings. IMPRESSION: 1. Schatzker II fracture head involving the anterior half of the medial tibial plateau with about 2.5 mm of depression of the involved fragment. 2. Underlying mild to moderate osteoarthritis especially in the medial compartment. 3. Lipohemarthrosis. Electronically Signed   By: Gaylyn RongWalter  Liebkemann M.D.   On: 05/22/2015 15:06    Crissie ReeseMonique Araujo, Med Student- Pager [phone] 05/23/2015, 8:08 AM MS4, [facility] FPTS Intern pager: [phone], text pages welcome   I have separately seen and examined the patient. I have discussed the findings and exam with Student Dr Erin FullingAraujo and agree with the above note.  My changes/additions are outlined in BLUE.   # Left Tibial Plateau Fracture - Displacement 2.715mm on CT scan. Immediately unable to bear weight.  - Will switch from Morphine to Dilaudid 1 mg Q 3 hrs PRN. F/u pain.  Goal to transition to Tramadol.  This may have to be done outpatient. - Miralax scheduled  # AKI on CKD  - Cr 1.78 above bl of 1.44. She may be somewhat dry.  - at baseline this am - am BMET  # Atrial Fibrillation S/P Mitral Replacement - chronically in afib. Rate controlled. Anticoagulated with coumadin. Follows with cards.  - Continue Digox 125mcg daily, Lopressor 50mg  BID - Obtain Dig level today (goal of <2) - stop heparin gtt, Restart coumadin since not going to [facility]  Ashly M. Nadine CountsGottschalk, DO PGY-2, [facility]

## 2015-05-23 NOTE — Care Management Obs Status (Signed)
MEDICARE OBSERVATION STATUS NOTIFICATION   Patient Details  Name: Tami MatterSally M Caesar MRN: [id number] Date of Birth: October 03, 1935   Medicare Observation Status Notification Given:  Yes    Lawerance Sabalebbie Aeneas [place]sworth, RN 05/23/2015, 1:10 PM

## 2015-05-23 NOTE — Progress Notes (Signed)
Patient wanted security to hold wallet while in hospital. [name] everything was counted  noted and walked down to security. Key and paper to retrieve items located in chart.

## 2015-05-23 NOTE — Discharge Instructions (Signed)

## 2015-05-23 NOTE — Consult Note (Signed)
ORTHOPAEDIC CONSULTATION  REQUESTING PHYSICIAN: Dickie La, MD  Chief Complaint: left knee pain  HPI: [name] is a 80 y.o. female who complains of a mechanical fall monday with left knee pain. No other pain.   Past Medical History  Diagnosis Date  . Hypertension   . GI bleed   . Chronic atrial fibrillation ([place])   . Dilated cardiomyopathy ([place])     history of this, now resolved  . GERD (gastroesophageal reflux disease)   . Dysphagia     Hx  . Nonsustained ventricular tachycardia ([place])   . Osteoporosis   . Microcytic anemia   . Arrhythmia     chronic afib NSVT  . RBBB (right bundle branch block)   . Heart murmur   . Asthma     years ago  . History of blood transfusion   . Protein in urine   . Hemorrhoid 04/2014    bleeding  . Fracture of tibial plateau 05/21/2015   Past Surgical History  Procedure Laterality Date  . Cholecystectomy    . Mitral valve prosthesis with angioplasty  1987  . Tubal ligation    . Transthoracic echocardiogram  02/2008, 03/2006, 06/2004  . Polyp removal    . Colonoscopy    . Trabeculectomy Right 05/17/2014    Procedure: TRABECULECTOMY WITH MITOMYCIN RIGHT EYE;  Surgeon: Marylynn Pearson, MD;  Location: [facility];  Service: Ophthalmology;  Laterality: Right;  . Mitomycin c application Right 05/23/5463    Procedure: MITOMYCIN C APPLICATION;  Surgeon: Marylynn Pearson, MD;  Location: [facility];  Service: Ophthalmology;  Laterality: Right;   Social History   Social History  . Marital Status: Divorced    Spouse Name: N/A  . Number of Children: N/A  . Years of Education: N/A   Social History Main Topics  . Smoking status: Former Research scientist (life sciences)  . Smokeless tobacco: Never Used     Comment: quiit 1987  . Alcohol Use: No  . Drug Use: No  . Sexual Activity: Not Asked   Other Topics Concern  . None   Social History Narrative   Family History  Problem Relation Age of Onset  . Hypertension Mother   . Colon cancer Neg Hx   . Cancer Sister     in the  Bladder   Allergies  Allergen Reactions  . Esomeprazole Magnesium     REACTION: stomach upset, nausea   Prior to Admission medications   Medication Sig Start Date End Date Taking? Authorizing Provider  amoxicillin (AMOXIL) 500 MG capsule TAKE 4 CAPSULES BY MOUTH 1 HOUR PRIOR TO DENTAL WORK 04/27/14  Yes Evans Lance, MD  Brinzolamide-Brimonidine 1-0.2 % SUSP Place 1 drop into the left eye 3 (three) times daily.    Yes Historical Provider, MD  CARTIA XT 180 MG 24 hr capsule TAKE 1 CAPSULE BY MOUTH DAILY 05/15/15  Yes Evans Lance, MD  Cholecalciferol (VITAMIN D) 2000 UNITS tablet Take 2,000 Units by mouth daily.   Yes Historical Provider, MD  [place] 125 MCG tablet TAKE 1 TABLET BY MOUTH EVERY DAY 03/07/15  Yes Evans Lance, MD  lipase/protease/amylase (CREON) 12000 units CPEP capsule Take 48,000 Units by mouth 3 (three) times daily before meals.   Yes Historical Provider, MD  metoprolol (LOPRESSOR) 50 MG tablet TAKE 1 1/2 TABLETS BY MOUTH TWICE DAILY Patient taking differently: TAKE 1.5 TABLETS (21m) BY MOUTH TWICE DAILY 03/14/15  Yes GEvans Lance MD  mirabegron ER (MYRBETRIQ) 25 MG TB24  tablet Take 25 mg by mouth daily.   Yes Historical Provider, MD  pantoprazole (PROTONIX) 40 MG tablet Take 40 mg by mouth daily.  08/10/12  Yes Historical Provider, MD  TRAVATAN Z 0.004 % SOLN ophthalmic solution Place 1 drop into both eyes at bedtime.  04/18/14  Yes Historical Provider, MD  warfarin (COUMADIN) 5 MG tablet Take 5-7.5 mg by mouth daily. Take 5 mg on Sun / Tues / Wed / Fri / Sat Take 7.5 mg on Mon / Thurs   Yes Historical Provider, MD   Ct Knee Left Wo Contrast  05/22/2015  CLINICAL DATA:  Left knee tibial plateau fracture. Fall in the shower. EXAM: CT OF THE left KNEE WITHOUT CONTRAST TECHNIQUE: Multidetector CT imaging of the left knee was performed according to the standard protocol. Multiplanar CT image reconstructions were also generated. COMPARISON:  05/22/2015 FINDINGS: Schatzker II  fracture of the medial tibial plateau, involving the anterior half of the plateau, with 2.5 mm depression of the involved fragment. The fracture exits anteromedially at about the level of the fused growth plate. Medial compartment old degenerative articular space narrowing with mild subcortical sclerosis due to osteoarthritis. Mild lateral compartmental articular space narrowing. Lipohemarthrosis.  No other fracture observed. No additional significant findings. IMPRESSION: 1. Schatzker II fracture head involving the anterior half of the medial tibial plateau with about 2.5 mm of depression of the involved fragment. 2. Underlying mild to moderate osteoarthritis especially in the medial compartment. 3. Lipohemarthrosis. Electronically Signed   By: Van Clines M.D.   On: 05/22/2015 15:06   Dg Knee Complete 4 Views Left  05/22/2015  CLINICAL DATA:  Pain following fall EXAM: LEFT KNEE - COMPLETE 4+ VIEW COMPARISON:  None. FINDINGS: Frontal, lateral, and bilateral oblique views were obtained. There is a fracture of the medial tibial plateau with slight displacement of fracture fragments. Along the anterior aspect of the medial tibial plateau, there is 2 mm of depression. There is a sizable joint effusion. No other fractures are evident. No dislocation. There is mild narrowing in the patellofemoral joint region in moderate narrowing medially. No erosive change. IMPRESSION: Medial tibial plateau fracture with 2 mm of depression anteriorly and slight separation of fracture fragments in the medial tibial plateau region. Sizable joint effusion. Moderate osteoarthritic change. No dislocation. Electronically Signed   By: Lowella Grip III M.D.   On: 05/22/2015 12:55    Positive ROS: All other systems have been reviewed and were otherwise negative with the exception of those mentioned in the HPI and as above.  Labs cbc  Recent Labs  05/22/15 1507 05/23/15 0552  WBC 8.8 6.2  HGB 11.5* 10.3*  HCT 36.5  33.0*  PLT 189 151    Labs inflam No results for input(s): CRP in the last 72 hours.  Invalid input(s): ESR  Labs coag  Recent Labs  05/22/15 1714  INR 2.87*     Recent Labs  05/22/15 1507 05/23/15 0552  NA 141 140  K 3.6 3.8  CL 108 108  CO2 23 22  GLUCOSE 109* 112*  BUN 26* 18  CREATININE 1.78* 1.42*  CALCIUM 9.7 8.9    Physical Exam: Filed Vitals:   05/23/15 0118 05/23/15 0540  BP: 133/74 131/65  Pulse: 50 61  Temp:  98.2 F (36.8 C)  Resp:  16   General: Alert, no acute distress Cardiovascular: No pedal edema Respiratory: No cyanosis, no use of accessory musculature GI: No organomegaly, abdomen is soft and non-tender Skin: No lesions  in the area of chief complaint other than those listed below in MSK exam.  Neurologic: Sensation intact distally save for the below mentioned MSK exam Psychiatric: Patient is competent for consent with normal mood and affect Lymphatic: No axillary or cervical lymphadenopathy  MUSCULOSKELETAL:  LLE: compartments are soft, she has a moderate effusion and lateral hematoma. Distally NVI. Pain with knee ROM Other extremities are atraumatic with painless ROM and NVI.  Assessment: Left medial plateau Left leg hematoma  Plan: Non-op treatment of plateau fx  NWB LLE Knee immobilizer full time  We will follow hematoma, ice, ace wrap as needed  Mobilize with PT  F/u in office in 2wks.    Renette Butters, MD Cell [phone]   05/23/2015 12:11 PM

## 2015-05-24 DIAGNOSIS — S82142A Displaced bicondylar fracture of left tibia, initial encounter for closed fracture: Secondary | ICD-10-CM | POA: Diagnosis not present

## 2015-05-24 LAB — BASIC METABOLIC PANEL
Anion gap: 10 (ref 5–15)
BUN: 13 mg/dL (ref 6–20)
CO2: 22 mmol/L (ref 22–32)
Calcium: 8.6 mg/dL — ABNORMAL LOW (ref 8.9–10.3)
Chloride: 109 mmol/L (ref 101–111)
Creatinine, Ser: 1.2 mg/dL — ABNORMAL HIGH (ref 0.44–1.00)
GFR calc Af Amer: 49 mL/min — ABNORMAL LOW (ref >60)
GFR calc non Af Amer: 42 mL/min — ABNORMAL LOW (ref >60)
Glucose, Bld: 110 mg/dL — ABNORMAL HIGH (ref 65–99)
Potassium: 3.8 mmol/L (ref 3.5–5.1)
Sodium: 141 mmol/L (ref 135–145)

## 2015-05-24 LAB — PROTIME-INR
INR: 3.31 — ABNORMAL HIGH (ref 0.00–1.49)
Prothrombin Time: 32.9 seconds — ABNORMAL HIGH (ref 11.6–15.2)

## 2015-05-24 LAB — GLUCOSE, CAPILLARY
Glucose-Capillary: 116 mg/dL — ABNORMAL HIGH (ref 65–99)
Glucose-Capillary: 96 mg/dL (ref 65–99)

## 2015-05-24 LAB — HEMOGLOBIN AND HEMATOCRIT, BLOOD
HCT: 36 % (ref 36.0–46.0)
Hemoglobin: 10.8 g/dL — ABNORMAL LOW (ref 12.0–15.0)

## 2015-05-24 MED ORDER — OXYCODONE HCL 5 MG PO TABS
10.0000 mg | ORAL_TABLET | ORAL | Status: DC
  Administered 2015-05-24 – 2015-05-25 (×7): 10 mg via ORAL
  Filled 2015-05-24 (×7): qty 2

## 2015-05-24 MED ORDER — WARFARIN SODIUM 2.5 MG PO TABS
2.5000 mg | ORAL_TABLET | Freq: Once | ORAL | Status: AC
  Administered 2015-05-24: 2.5 mg via ORAL
  Filled 2015-05-24: qty 1

## 2015-05-24 NOTE — Progress Notes (Signed)
She is comfortable  LLE: compartments are soft, hematoma has developed a blister, distally she is NVI  Plan: ACE wrap and ice for hematoma Elevate leg Knee immobilizer full time NWB LLE   Wound care c/s for hematoma blistering   [name] D

## 2015-05-24 NOTE — Progress Notes (Signed)
FPTS Interim Progress Note  S: Notified by RN that patient has blister on left leg under knee immobilizer. Went to bedside. Patient tearful stating she is having throbbing pain in her left leg. Patient asks "what are the doctor's going to do something about my leg?" and "Is my leg going to be amputated?". Denies any shortness of breath, chest pain, nausea, or fever.   O: BP 182/106 mmHg  Pulse 148  Temp(Src) 97.9 F (36.6 C) (Oral)  Resp 18  Ht 5\' 7"  (1.702 m)  Wt 176 lb 4.8 oz (79.969 kg)  BMI 27.61 kg/m2  SpO2 96%   General: Patient appears tearful and somewhat anxious, in no acute distress Cardiac: Irregularly irregular rhythm, increased rate. 2+ dorsalis pedis pulses bilaterally. MSK: ~2-3 cm serous filled blister on left lateral shin with surrounding swelling and ecchymosis (see picture). Pain upon passive dorsiflexion of left foot    A/P: Will continue to monitor blister on left leg. Patient not requesting pain medication PRN, encouraged patient to request Morphine q 4 if needed. She also appeared very anxious and worried her leg is going to be amputated, assured patient that is highly unlikely. After receiving 1 dose of PRN Morphine, patient appeared less tearful and relaxed. Will also need to give PRN Labetolol if she continues to be hypertensive (Sys BP >180 and Diastolic BP >100).   Beaulah Dinninghristina M Chynah Orihuela, MD 05/24/2015, 6:00 AM PGY-1, [facility] pager [phone]

## 2015-05-24 NOTE — Consult Note (Signed)
WOC consult placed however WOC just spoke with bedside nurse to advise topical care, cover and protect with silicone foam.  Monitor for change.  Ortho has instructed bedside nurse to report any acute changes in site to them.  Change foam every 3 days.    Discussed POC with  bedside nurse.  Re consult if needed, will not follow at this time. Thanks  Kamani Magnussen [employer] RN, CWOCN [phone])

## 2015-05-24 NOTE — Progress Notes (Signed)
New serous blister noted on lateral left leg. MD notified. Will continue to monitor.

## 2015-05-24 NOTE — Progress Notes (Signed)
Assessment/Plan: Tibial plateau fracture Left Leg Hematoma increased today with small superficial blister over area.  INR 3.31.  Plan: Non-op treatment of plateau fx NWB LLE Knee immobilizer full time We will follow hematoma, ice, ace wrap, elevate. Mobilize with PT F/u in office in 2wks.   Subjective: Patient reports pain as moderate.  Nauseated yesterday.  No CP, SOB.  Objective:   VITALS:   Filed Vitals:   05/23/15 2158 05/23/15 2322 05/24/15 0527 05/24/15 0640  BP: 177/99 157/86 182/106 139/52  Pulse: 80 81 148 75  Temp: 99.3 F (37.4 C) 98.5 F (36.9 C) 97.9 F (36.6 C)   TempSrc:      Resp: 19 18 18    Height:      Weight:    80.7 kg (177 lb 14.6 oz)  SpO2: 100% 97% 96%     Lab Results  Component Value Date   WBC 6.2 05/23/2015   HGB 10.3* 05/23/2015   HCT 33.0* 05/23/2015   MCV 88.2 05/23/2015   PLT 151 05/23/2015   BMET    Component Value Date/Time   NA 141 05/24/2015 0600   K 3.8 05/24/2015 0600   CL 109 05/24/2015 0600   CO2 22 05/24/2015 0600   GLUCOSE 110* 05/24/2015 0600   BUN 13 05/24/2015 0600   CREATININE 1.20* 05/24/2015 0600   CREATININE 1.44* 04/30/2015 1101   CALCIUM 8.6* 05/24/2015 0600   GFRNONAA 42* 05/24/2015 0600   GFRAA 49* 05/24/2015 0600    Physical Exam Gen: Uncomfortable with movement of leg, NAD.  Ice pack directly over hematoma under immobilizer on arrival. Neuro: Conversant, Alert ad oriented. MSK: Neurovascular intact distally Pain w/ knee ROM Sensation intact distally Intact DP pulses distally Left lateral hematoma w/ mild ecchymosis.  Superficial 2 cm blister. (see picture in resident note).  LABS  Results for orders placed or performed during the hospital encounter of 05/22/15 (from the past 24 hour(s))  Glucose, capillary     Status: None   Collection Time: 05/23/15  8:23 AM  Result Value Ref Range   Glucose-Capillary 97 65 - 99 mg/dL  Digoxin level     Status: None   Collection Time: 05/23/15 11:43  AM  Result Value Ref Range   Digoxin Level 1.2 0.8 - 2.0 ng/mL  Digoxin level     Status: None   Collection Time: 05/23/15  5:02 PM  Result Value Ref Range   Digoxin Level 1.3 0.8 - 2.0 ng/mL  Glucose, capillary     Status: Abnormal   Collection Time: 05/24/15  5:36 AM  Result Value Ref Range   Glucose-Capillary 116 (H) 65 - 99 mg/dL  Protime-INR     Status: Abnormal   Collection Time: 05/24/15  6:00 AM  Result Value Ref Range   Prothrombin Time 32.9 (H) 11.6 - 15.2 seconds   INR 3.31 (H) 0.00 - 1.49  Basic metabolic panel (BMP)     Status: Abnormal   Collection Time: 05/24/15  6:00 AM  Result Value Ref Range   Sodium 141 135 - 145 mmol/L   Potassium 3.8 3.5 - 5.1 mmol/L   Chloride 109 101 - 111 mmol/L   CO2 22 22 - 32 mmol/L   Glucose, Bld 110 (H) 65 - 99 mg/dL   BUN 13 6 - 20 mg/dL   Creatinine, Ser 1.611.20 (H) 0.44 - 1.00 mg/dL   Calcium 8.6 (L) 8.9 - 10.3 mg/dL   GFR calc non Af Amer 42 (L) >60 mL/min  GFR calc Af Amer 49 (L) >60 mL/min   Anion gap 10 5 - [id number] [place] III, PA-C 05/24/2015, 8:13 AM

## 2015-05-24 NOTE — Progress Notes (Signed)
Family Medicine Teaching Service Daily Progress Note Intern Pager: [phone]  [name] Medical record number: [id number] Date of birth: 05/16/1935 Age: 80 y.o. Gender: female  Primary Care Provider: Denny Levy, MD Consultants: Ortho Code Status: Full  Pt Overview and Major Events to Date:  Left tibial plateau fracture   Assessment and Plan: [name] is a 80 y.o. female admitted on 5/2 with Left tibial plateau fracture. PMH is significant for chronic atrial fibrillation, GERD, Osteoporosis, S/P Mitral Replacement, Chronic Anticoagulation.   # Left Tibial Plateau Fracture / L Leg Hematoma - Displacement 2.33mm on CT scan. Immediately unable to bear weight. - D/C IV pain meds. Increased PO oxycodone dose to 10 mg Q4 hours. F/U this afternoon. Neurovascularly in tact in LLE.  - Hbg 10.8 down from 11.5 on admission. Will continue to monitor, especially in the setting of leg hematoma.  - Anemia stable.   - Hematoma stable > apply ace wrap.  - Knee immobilizer in place.  - Elevation as able - Miralax scheduled  #Blister on surface of left leg hematoma - Ace bandage.  - Wound care consulted for dressing over blister under knee immobilizer.   # AKI on CKD - resolved. Cr today 1.20 (5/4) back to bl - Follow as O/P.   # Atrial Fibrillation S/P Mitral Replacement - chronically in afib. Rate controlled. Anticoagulated with coumadin. Follows with cards. Dig level appropriate.  - Continue Digox daily - Lopressor  BID -INR supratherapeutic. Coumadin dosing per pharm.   # GERD  - Protonix  daily.   FEN/GI: Heart heathy diet Prophylaxis: Anticoagulated.   Disposition: Pending further evaluation.   Review Of Systems: Per HPI with the following additions: None.  Otherwise the remainder of the systems were negative.  Subjective:  She slept through the night. No acute overnight events. Her pain is not well controlled, being at a 8-9/10 this morning  and going down to 6-7/10 with IV morphine.    Requested Travaten eye drops, which she takes qhs. Today she denies sweats and having to have sheets and pillow changed.  Denies SOB, CP or pressure, HA, heart flutter.   Objective: Temp:  [97.9 F (36.6 C)-99.3 F (37.4 C)] 97.9 F (36.6 C) (05/04 0527) Pulse Rate:  [73-148] 75 (05/04 0640) Resp:  [18-20] 18 (05/04 0527) BP: (139-182)/(52-106) 139/52 mmHg (05/04 0640) SpO2:  [94 %-100 %] 96 % (05/04 0527) Weight:  [80.7 kg (177 lb 14.6 oz)] 80.7 kg (177 lb 14.6 oz) (05/04 0640)   Physical Exam: General: Hard of hearing- wears hearing aid on R ear.  Cardiovascular: Clicking sound heard peristernal bilaterally, but best at lower left sternal border. No other murmurs or gallops appreciated on exam. No S3 or S4 appreciated. PMI non-displaced.  Respiratory: CTA, non-labored breath with symmetric BS bilaterally  MSK: Swelling above and below the knee on left leg. Swelling below knee>above. Warmth and redness appreciated below the knee. Also a contained soft lump on lateral aspect of lower leg is noted and painful.  +2 dorsalis pedis and posterior tibialis pulses on L leg distally from injury . Blister on top of hematoma.  Extremities: No pitting edema in either leg. +2 dorsalis pedis pulses bilaterally.   Laboratory:  Recent Labs Lab 05/22/15 1507 05/23/15 0552  WBC 8.8 6.2  HGB 11.5* 10.3*  HCT 36.5 33.0*  PLT 189 151    Recent Labs Lab 05/22/15 1507 05/23/15 0552 05/24/15 0600  NA 141 140 141  K 3.6  3.8 3.8  CL 108 108 109  CO2 23 22 22   BUN 26* 18 13  CREATININE 1.78* 1.42* 1.20*  CALCIUM 9.7 8.9 8.6*  GLUCOSE 109* 112* 110*   INR 2.8, PT 29.6   Imaging/Diagnostic Tests: No results found.  Crissie ReeseMonique Araujo, Med Student- Pager [phone] 05/24/2015, 9:28 AM MS4, [facility] FPTS Intern pager: [phone], text pages welcome  I agree with the above evaluation, assessment, and plan. Any correctional changes can  be noted in [facility].   Yolande Jollyaleb G Ishi Danser, MD Family Medicine Resident - PGY 2

## 2015-05-24 NOTE — Progress Notes (Signed)
ANTICOAGULATION CONSULT NOTE  Pharmacy Consult for Warfarin Indication: atrial fibrillation/mechanical MVR  Allergies  Allergen Reactions  . Esomeprazole Magnesium     REACTION: stomach upset, nausea    Patient Measurements: Height:  (170.2 cm) Weight: 177 lb 14.6 oz (80.7 kg) IBW/kg (Calculated) : 61.6  Vital Signs: Temp: 97.9 F (36.6 C) (05/04 0527) BP: 139/52 mmHg (05/04 0640) Pulse Rate: 75 (05/04 0640)  Labs:  Recent Labs  05/22/15 1507 05/22/15 1714 05/23/15 0552 05/24/15 0600 05/24/15 0856  HGB 11.5*  --  10.3*  --  10.8*  HCT 36.5  --  33.0*  --  36.0  PLT 189  --  151  --   --   LABPROT  --  29.6*  --  32.9*  --   INR  --  2.87*  --  3.31*  --   CREATININE 1.78*  --  1.42* 1.20*  --     Estimated Creatinine Clearance: 42.9 mL/min (by C-G formula based on Cr of 1.2).   Medical History: Past Medical History  Diagnosis Date  . Hypertension   . GI bleed   . Chronic atrial fibrillation (HCC)   . Dilated cardiomyopathy (HCC)     history of this, now resolved  . GERD (gastroesophageal reflux disease)   . Dysphagia     Hx  . Nonsustained ventricular tachycardia (HCC)   . Osteoporosis   . Microcytic anemia   . Arrhythmia     chronic afib NSVT  . RBBB (right bundle branch block)   . Heart murmur   . Asthma     years ago  . History of blood transfusion   . Protein in urine   . Hemorrhoid 04/2014    bleeding  . Fracture of tibial plateau 05/21/2015    Medications:  Prescriptions prior to admission  Medication Sig Dispense Refill Last Dose  . amoxicillin (AMOXIL) 500 MG capsule TAKE 4 CAPSULES BY MOUTH 1 HOUR PRIOR TO DENTAL WORK 4 capsule 0 PRN  . Brinzolamide-Brimonidine 1-0.2 % SUSP Place 1 drop into the left eye 3 (three) times daily.    05/21/2015 at Unknown time  . CARTIA XT 180 MG 24 hr capsule TAKE 1 CAPSULE BY MOUTH DAILY 90 capsule 2 05/21/2015 at Unknown time  . Cholecalciferol (VITAMIN D) 2000 UNITS tablet Take 2,000 Units by mouth  daily.   05/21/2015 at Unknown time  . DIGOX 125 MCG tablet TAKE 1 TABLET BY MOUTH EVERY DAY 90 tablet 3 05/21/2015 at Unknown time  . lipase/protease/amylase (CREON) 12000 units CPEP capsule Take 48,000 Units by mouth 3 (three) times daily before meals.   Past Month at Unknown time  . metoprolol (LOPRESSOR) 50 MG tablet TAKE 1 1/2 TABLETS BY MOUTH TWICE DAILY (Patient taking differently: TAKE 1.5 TABLETS ( ) BY MOUTH TWICE DAILY) 90 tablet 10 05/21/2015 at pm  . mirabegron ER (MYRBETRIQ) 25 MG TB24 tablet Take 25 mg by mouth daily.   05/21/2015 at Unknown time  . pantoprazole (PROTONIX) 40 MG tablet Take 40 mg by mouth daily.    05/21/2015 at Unknown time  . TRAVATAN Z 0.004 % SOLN ophthalmic solution Place 1 drop into both eyes at bedtime.   12 05/21/2015 at Unknown time  . warfarin (COUMADIN) 5 MG tablet Take 5-7.5 mg by mouth daily. Take 5 mg on Sun / Tues / Wed / Fri / Sat Take 7.5 mg on Mon / Thurs   05/21/2015 at Unknown time    Assessment: 80 y.o. female admitted  with L tibial fracture, PTA Coumadin for afib/mechanical MVR previously on hold while awaiting surgery. Heparin never initiated as INR therapeutic, and warfarin resumed with no surgery needed. INR therapeutic 2.87>3.31 (goal 2.5-3.5) after warfarin resumed 5/3 PM. Hg 10.8 stable, plt wnl. No bleed per RN, but hematoma developing on L leg.  PTA warfarin dose: 5mg  daily except 7.5mg  on MF [facility] (last dose 5/1 pta)  Goal of Therapy:  INR 2.5-3.5  Monitor platelets by anticoagulation protocol: Yes   Plan:  Warfarin 2.5mg  x1 dose tonight Daily INR Monitor s/sx bleeding   Babs BertinHaley Sagan Wurzel, PharmD, BCPS Clinical Pharmacist Pager [phone] 05/24/2015 10:41 AM

## 2015-05-24 NOTE — Evaluation (Signed)
Physical Therapy Evaluation Patient Details Name: [name] MRN: [id number] DOB: [birth year] Today's Date: 05/24/2015   History of Present Illness  [name] is a 80 y.o. female admitted on 5/2 with Left tibial plateau fracture. PMH is significant for chronic atrial fibrillation, GERD, Osteoporosis, S/P Mitral Replacement, Chronic Anticoagulation.  Clinical Impression  Patient presents with decreased independence with mobility due to deficits listed in PT problem list.  She will benefit from follow up HHPT and 24 hour assist from family at d/c (which she reports is available).      Follow Up Recommendations Home health PT;Supervision/Assistance - 24 hour [facility](HH aide)    Equipment Recommendations  Rolling walker with 5" wheels;Wheelchair (measurements PT);Wheelchair cushion (measurements PT);Other (comment);3in1 (PT) (tub bench)    Recommendations for Other Services       Precautions / Restrictions Precautions Precautions: Fall Required Braces or Orthoses: Knee Immobilizer - Left Knee Immobilizer - Left: On at all times Restrictions Weight Bearing Restrictions: Yes LLE Weight Bearing: Non weight bearing      Mobility  Bed Mobility Overal bed mobility: Needs Assistance Bed Mobility: Supine to Sit     Supine to sit: Mod assist     General bed mobility comments: assist for legs off bed and to lift trunk, pt able to scoot to EOB with assist for L LE  Transfers Overall transfer level: Needs assistance Equipment used: Rolling walker (2 wheeled) Transfers: Sit to/from [employer] CorporationStand;Stand Pivot Transfers Sit to Stand: Mod assist Stand pivot transfers: Mod assist;Min assist       General transfer comment: cues for technique, NWB L and hand placement; hop steps around with RW and chair brought up to pt with assist to safely lower to chair   Ambulation/Gait                Stairs            Wheelchair Mobility    Modified Rankin (Stroke Patients Only)        Balance Overall balance assessment: Needs assistance   Sitting balance-Leahy Scale: Good     Standing balance support: Bilateral upper extremity supported Standing balance-Leahy Scale: Poor Standing balance comment: UE support needed for NWB L                             Pertinent Vitals/Pain Pain Assessment: Faces Faces Pain Scale: Hurts little more Pain Location: L LE with movement Pain Descriptors / Indicators: Aching;Discomfort;Guarding Pain Intervention(s): Monitored during session;Repositioned;Limited activity within patient's tolerance;Ice applied    Home Living Family/patient expects to be discharged to:: Private residence Living Arrangements: Alone Available Help at Discharge: Family;Available 24 hours/day Type of Home: Independent living facility Home Access: Level entry     Home Layout: One level Home Equipment: Grab bars - tub/shower      Prior Function Level of Independence: Independent               Hand Dominance        Extremity/Trunk Assessment   Upper Extremity Assessment: Overall WFL for tasks assessed           Lower Extremity Assessment: LLE deficits/detail   LLE Deficits / Details: edematous in immobilizer, liftes from hip with assist and ankle AROM WFL     Communication   Communication: No difficulties  Cognition Arousal/Alertness: Lethargic Behavior During Therapy: WFL for tasks assessed/performed Overall Cognitive Status: No family/caregiver present to determine baseline cognitive functioning Area of Impairment:  Problem solving;Following commands     Memory: Decreased short-term memory Following Commands: Follows multi-step commands with increased time     Problem Solving: Slow processing;Requires verbal cues      General Comments      Exercises General Exercises - Lower Extremity Ankle Circles/Pumps: AROM;Both;10 reps;Seated      Assessment/Plan    PT Assessment Patient needs continued PT  services  PT Diagnosis Generalized weakness;Acute pain;Difficulty walking   PT Problem List Decreased strength;Decreased activity tolerance;Decreased balance;Decreased mobility;Pain;Decreased knowledge of precautions;Decreased knowledge of use of DME  PT Treatment Interventions DME instruction;Gait training;Balance training;Functional mobility training;Patient/family education;Therapeutic activities;Therapeutic exercise   PT Goals (Current goals can be found in the Care Plan section) Acute Rehab PT Goals Patient Stated Goal: To go home with daughter's assist PT Goal Formulation: With patient Time For Goal Achievement: 05/31/15 Potential to Achieve Goals: Good    Frequency Min 5X/week   Barriers to discharge        Co-evaluation               End of Session Equipment Utilized During Treatment: Gait belt;Left knee immobilizer Activity Tolerance: Patient limited by fatigue;Patient limited by pain Patient left: in chair;with call bell/phone within reach;with chair alarm set           Time: 4098-1191 PT Time Calculation (min) (ACUTE ONLY): 34 min   Charges:     PT Treatments $Therapeutic Activity: 23-37 mins   PT G CodesElray Mcgregor 06-10-15, 11:26 AM  Sheran Lawless, PT [phone] 06/10/2015

## 2015-05-25 DIAGNOSIS — R262 Difficulty in walking, not elsewhere classified: Secondary | ICD-10-CM | POA: Diagnosis not present

## 2015-05-25 DIAGNOSIS — R531 Weakness: Secondary | ICD-10-CM | POA: Diagnosis not present

## 2015-05-25 DIAGNOSIS — K219 Gastro-esophageal reflux disease without esophagitis: Secondary | ICD-10-CM | POA: Diagnosis not present

## 2015-05-25 DIAGNOSIS — I472 Ventricular tachycardia: Secondary | ICD-10-CM | POA: Diagnosis not present

## 2015-05-25 DIAGNOSIS — I482 Chronic atrial fibrillation: Secondary | ICD-10-CM | POA: Diagnosis not present

## 2015-05-25 DIAGNOSIS — R079 Chest pain, unspecified: Secondary | ICD-10-CM | POA: Diagnosis not present

## 2015-05-25 DIAGNOSIS — Z87891 Personal history of nicotine dependence: Secondary | ICD-10-CM | POA: Diagnosis not present

## 2015-05-25 DIAGNOSIS — M6281 Muscle weakness (generalized): Secondary | ICD-10-CM | POA: Diagnosis not present

## 2015-05-25 DIAGNOSIS — S82142A Displaced bicondylar fracture of left tibia, initial encounter for closed fracture: Secondary | ICD-10-CM | POA: Diagnosis not present

## 2015-05-25 DIAGNOSIS — D631 Anemia in chronic kidney disease: Secondary | ICD-10-CM | POA: Diagnosis not present

## 2015-05-25 DIAGNOSIS — K5900 Constipation, unspecified: Secondary | ICD-10-CM | POA: Diagnosis not present

## 2015-05-25 DIAGNOSIS — S82001A Unspecified fracture of right patella, initial encounter for closed fracture: Secondary | ICD-10-CM | POA: Diagnosis not present

## 2015-05-25 DIAGNOSIS — R488 Other symbolic dysfunctions: Secondary | ICD-10-CM | POA: Diagnosis not present

## 2015-05-25 DIAGNOSIS — R278 Other lack of coordination: Secondary | ICD-10-CM | POA: Diagnosis not present

## 2015-05-25 DIAGNOSIS — Z7901 Long term (current) use of anticoagulants: Secondary | ICD-10-CM | POA: Diagnosis not present

## 2015-05-25 DIAGNOSIS — S8012XA Contusion of left lower leg, initial encounter: Secondary | ICD-10-CM | POA: Diagnosis not present

## 2015-05-25 DIAGNOSIS — S82142D Displaced bicondylar fracture of left tibia, subsequent encounter for closed fracture with routine healing: Secondary | ICD-10-CM | POA: Diagnosis not present

## 2015-05-25 DIAGNOSIS — N179 Acute kidney failure, unspecified: Secondary | ICD-10-CM | POA: Diagnosis not present

## 2015-05-25 DIAGNOSIS — S82111A Displaced fracture of right tibial spine, initial encounter for closed fracture: Secondary | ICD-10-CM | POA: Diagnosis not present

## 2015-05-25 DIAGNOSIS — D509 Iron deficiency anemia, unspecified: Secondary | ICD-10-CM | POA: Diagnosis not present

## 2015-05-25 DIAGNOSIS — R072 Precordial pain: Secondary | ICD-10-CM | POA: Diagnosis not present

## 2015-05-25 DIAGNOSIS — I4510 Unspecified right bundle-branch block: Secondary | ICD-10-CM | POA: Diagnosis not present

## 2015-05-25 DIAGNOSIS — Z9181 History of falling: Secondary | ICD-10-CM | POA: Diagnosis not present

## 2015-05-25 DIAGNOSIS — Z5181 Encounter for therapeutic drug level monitoring: Secondary | ICD-10-CM | POA: Diagnosis not present

## 2015-05-25 DIAGNOSIS — D62 Acute posthemorrhagic anemia: Secondary | ICD-10-CM | POA: Diagnosis not present

## 2015-05-25 DIAGNOSIS — S80822A Blister (nonthermal), left lower leg, initial encounter: Secondary | ICD-10-CM | POA: Diagnosis not present

## 2015-05-25 DIAGNOSIS — Z79899 Other long term (current) drug therapy: Secondary | ICD-10-CM | POA: Diagnosis not present

## 2015-05-25 DIAGNOSIS — I129 Hypertensive chronic kidney disease with stage 1 through stage 4 chronic kidney disease, or unspecified chronic kidney disease: Secondary | ICD-10-CM | POA: Diagnosis not present

## 2015-05-25 DIAGNOSIS — N183 Chronic kidney disease, stage 3 (moderate): Secondary | ICD-10-CM | POA: Diagnosis not present

## 2015-05-25 DIAGNOSIS — I10 Essential (primary) hypertension: Secondary | ICD-10-CM | POA: Diagnosis not present

## 2015-05-25 DIAGNOSIS — W19XXXD Unspecified fall, subsequent encounter: Secondary | ICD-10-CM | POA: Diagnosis not present

## 2015-05-25 DIAGNOSIS — Z952 Presence of prosthetic heart valve: Secondary | ICD-10-CM | POA: Diagnosis not present

## 2015-05-25 DIAGNOSIS — Z954 Presence of other heart-valve replacement: Secondary | ICD-10-CM | POA: Diagnosis not present

## 2015-05-25 DIAGNOSIS — D649 Anemia, unspecified: Secondary | ICD-10-CM | POA: Diagnosis not present

## 2015-05-25 DIAGNOSIS — R0789 Other chest pain: Secondary | ICD-10-CM | POA: Diagnosis not present

## 2015-05-25 DIAGNOSIS — N189 Chronic kidney disease, unspecified: Secondary | ICD-10-CM | POA: Diagnosis not present

## 2015-05-25 DIAGNOSIS — M1990 Unspecified osteoarthritis, unspecified site: Secondary | ICD-10-CM | POA: Diagnosis not present

## 2015-05-25 LAB — PROTIME-INR
INR: 3.04 — ABNORMAL HIGH (ref 0.00–1.49)
Prothrombin Time: 30.9 seconds — ABNORMAL HIGH (ref 11.6–15.2)

## 2015-05-25 LAB — GLUCOSE, CAPILLARY: Glucose-Capillary: 83 mg/dL (ref 65–99)

## 2015-05-25 MED ORDER — POLYETHYLENE GLYCOL 3350 17 G PO PACK: 17.0000 g | PACK | Freq: Two times a day (BID) | ORAL | Status: DC

## 2015-05-25 MED ORDER — OXYCODONE HCL 10 MG PO TABS: 10.0000 mg | ORAL_TABLET | ORAL | Status: DC

## 2015-05-25 MED ORDER — SENNA 8.6 MG PO TABS
1.0000 | ORAL_TABLET | Freq: Every day | ORAL | Status: DC
  Administered 2015-05-25: 8.6 mg via ORAL
  Filled 2015-05-25: qty 1

## 2015-05-25 NOTE — Progress Notes (Signed)
Attempted report X2. Left on hold for 16 minutes. Called 3rd time, left name and number with secretary for nurse to call for report.

## 2015-05-25 NOTE — NC FL2 (Signed)
[place] MEDICAID FL2 LEVEL OF CARE SCREENING TOOL     IDENTIFICATION  [name] Birthdate: 1935/03/21 Sex: female Admission Date (Current Location): 05/22/2015  [facility] and [place] Number:  Producer, television/film/video and Address:  The . [facility], [address] [place], [place] [address]      Provider Number: [id number]  Attending Physician Name and Address:  Nestor Ramp, MD  Relative Name and Phone Number:  Okey Dupre daughter, [phone]    Current Level of Care: Hospital Recommended Level of Care: Skilled Nursing Facility Prior Approval Number:    Date Approved/Denied:   PASRR Number: [id number]  Discharge Plan: SNF    Current Diagnoses: Patient Active Problem List   Diagnosis Date Noted  . Tibial plateau fracture 05/22/2015  . Healthcare maintenance 04/30/2015  . Encounter for therapeutic drug monitoring 02/15/2013  . Long term (current) use of anticoagulants 05/03/2010  . URINARY FREQUENCY 05/24/2008  . RBBB 02/16/2008  . VENTRICULAR TACHYCARDIA 02/16/2008  . PROSTHETIC VALVE-MECHANICAL 02/16/2008  . ANEMIA, IRON DEFICIENCY, UNSPEC. 03/19/2006  . HYPERTENSION, BENIGN SYSTEMIC 03/19/2006  . ATRIAL FIBRILLATION 03/19/2006  . RHINITIS, ALLERGIC 03/19/2006  . REFLUX ESOPHAGITIS 03/19/2006  . OSTEOARTHRITIS, LOWER LEG 03/19/2006  . OSTEOPOROSIS, UNSPECIFIED 03/19/2006    Orientation RESPIRATION BLADDER Height & Weight     Self, Time, Situation, Place  Normal Continent Weight: 181 lb 10.5 oz (82.4 kg) Height:   (170.2 cm)  BEHAVIORAL SYMPTOMS/MOOD NEUROLOGICAL BOWEL NUTRITION STATUS      Continent Diet (Please see DC summary)  AMBULATORY STATUS COMMUNICATION OF NEEDS Skin   Limited Assist Verbally Normal                       Personal Care Assistance Level of Assistance  Bathing, Feeding, Dressing Bathing Assistance: Limited assistance Feeding assistance: Independent Dressing Assistance: Limited assistance      Functional Limitations Info             SPECIAL CARE FACTORS FREQUENCY  PT (By licensed PT)     PT Frequency: min 3x/week              Contractures      Additional Factors Info  Code Status, Allergies Code Status Info: Full Allergies Info: Esomeprazole Magnesium           Current Medications (05/25/2015):  This is the current hospital active medication list Current Facility-Administered Medications  Medication Dose Route Frequency Provider Last Rate Last Dose  . 0.9 %  sodium chloride infusion   Intravenous Continuous Yolande Jolly, MD 125 mL/hr at 05/25/15 0457    . Brinzolamide-Brimonidine 1-0.2 % SUSP 1 drop  1 drop Ophthalmic TID Almon Hercules, RPH   1 drop at 05/25/15 1159  . digoxin (LANOXIN) tablet 125 mcg  125 mcg Oral Daily Yolande Jolly, MD   125 mcg at 05/25/15 1156  . labetalol (NORMODYNE,TRANDATE) injection 10 mg  10 mg Intravenous Q2H PRN Yolande Jolly, MD   10 mg at 05/22/15 2335  . metoprolol tartrate (LOPRESSOR) tablet 75 mg  75 mg Oral BID Yolande Jolly, MD   75 mg at 05/25/15 1156  . mirabegron ER (MYRBETRIQ) tablet 25 mg  25 mg Oral Daily Yolande Jolly, MD   25 mg at 05/25/15 1155  . oxyCODONE (Oxy IR/ROXICODONE) immediate release tablet 10 mg  10 mg Oral Q4H while awake Yolande Jolly, MD   10 mg at 05/25/15 1157  .  pantoprazole (PROTONIX) EC tablet 40 mg  40 mg Oral Daily Yolande Jollyaleb G Melancon, MD   40 mg at 05/25/15 1156  . polyethylene glycol (MIRALAX / GLYCOLAX) packet 17 g  17 g Oral BID Yolande Jollyaleb G Melancon, MD      . promethazine (PHENERGAN) tablet 25 mg  25 mg Oral Q6H PRN Raliegh IpAshly M Gottschalk, DO      . senna (SENOKOT) tablet 8.6 mg  1 tablet Oral Daily Ashly M Gottschalk, DO   8.6 mg at 05/25/15 1156  . Warfarin - Pharmacist Dosing Inpatient   Does not apply q1800 Almon HerculesHaley P Baird, [facility] Orthopaedic SurgeryRPH       Facility-Administered Medications Ordered in Other Encounters  Medication Dose Route Frequency Provider Last Rate Last Dose  . mitoMYcin  (MUTAMYCIN) Injection Use in OR only (0.4 mg/ml)  0.5 mL Right Eye Once Chalmers Guestoy Whitaker, MD         Discharge Medications: Please see discharge summary for a list of discharge medications.  Relevant Imaging Results:  Relevant Lab Results:   Additional Information SSN: [id number]3552  Finis Hendricksen S VenturaRayyan, [place]CSWA

## 2015-05-25 NOTE — Discharge Summary (Signed)
[facility]  [name] Medical record number: [id number] Date of birth: 02-Sep-1935 Age: 80 y.o. Gender: female Date of Admission: 05/22/2015  Date of Discharge: 5/52017 Admitting Physician: Nestor RampSara L Neal, MD  Primary Care Provider: Denny LevySara Neal, MD Consultants: Orthopedics.   Indication for Hospitalization: Tibial Plateau Fracture, Inability to Bear Weight.   Discharge Diagnoses/Problem List:  Chronic Atrial Fibrillation S/P Mitral Valve Replacement Chronic Anticoagulation on Coumadin Left Tibial Plateau Fracture Left Lower Extremity Hematoma  Disposition: Rehab / SNF  Discharge Condition: Stable.   Discharge Exam:  General: Hard of hearing- wears hearing aid on R ear.  Cardiovascular: Clicking sound heard peristernal bilaterally, but best at lower left sternal border. No other murmurs or gallops appreciated on exam. No S3 or S4 appreciated. PMI non-displaced.  MSK: Swelling above knee is decreased as is tenderness. Swelling below knee slightly improved. Ace wrapped. +2 dorsalis pedis and posterior tibialis pulses on L leg distally from injury . Blister on top of hematoma. Hematoma is tender, but stable in size. Knee immobilizer replaced.  Extremities: No pitting edema in either leg. +2 dorsalis pedis pulses bilaterally. Sensation intact distally to injury   Brief Hospital Course:  In Brief, pt. Is a 80 y/o AAF with hx of Chronic Atrial Fibrillation, Mitral Valve Replacement, Chronic Anticoagulation with coumadin who fell in her shower on 5/2 and immediately had pain and swelling in her left lower extremity with inability to flex her knee. She was evaluated in the ED and found to have a left tibial plateau fracture of the anteromedial segment with 2.335mm depression. She could not ambulate with crutches for non-weight bearing status, and her pain was unable to be controlled. There was some concern initially that her fracture  would need to be surgically reduced. Ortho subsequently evaluated and felt she should be managed nonoperatively. Her knee was immobilized on admission, ice applied as able, and elevated as able. Her pain was controlled and transitioned to oral pain medicine for control at discharge. Additionally, she had an AKI and required IVF hydration on admission. Her kidney function improved. She did developed a hematoma on her left leg likely as a result of her chronic anticoagulation and injury to the area. This remained stable and her hemoglobin was stable during her admission. She did develop some skin blistering over this hematoma, and Wound Care was consulted for this. They recommended simple foam dressing under her knee immobilizer to prevent further ulceration. With her pain well controlled, kidney function improved, and plan for discharge to SNF for rehab, she was found to be safe for discharge with close follow up.     Issues for Follow Up:  1. F/U knee pain and swelling improvement.  2. F/U Hematoma size and ensure no further blistering / wound development.  3. F/U Anticoagulation and INR.   Significant Procedures: None.   Significant Labs and Imaging:   Recent Labs Lab 05/22/15 1507 05/23/15 0552 05/24/15 0856  WBC 8.8 6.2  --   HGB 11.5* 10.3* 10.8*  HCT 36.5 33.0* 36.0  PLT 189 151  --     Recent Labs Lab 05/22/15 1507 05/23/15 0552 05/24/15 0600  NA 141 140 141  K 3.6 3.8 3.8  CL 108 108 109  CO2 23 22 22   GLUCOSE 109* 112* 110*  BUN 26* 18 13  CREATININE 1.78* 1.42* 1.20*  CALCIUM 9.7 8.9 8.6*    IMPRESSION: 1. Schatzker II fracture head involving the anterior half of  the medial tibial plateau with about 2.5 mm of depression of the involved fragment. 2. Underlying mild to moderate osteoarthritis especially in the medial compartment. 3. Lipohemarthrosis.  Results/Tests Pending at Time of Discharge: None  Discharge Medications:    Medication List    STOP taking  these medications        amoxicillin 500 MG capsule  Commonly known as:  AMOXIL      TAKE these medications        Brinzolamide-Brimonidine 1-0.2 % Susp  Place 1 drop into the left eye 3 (three) times daily.     CARTIA XT 180 MG 24 hr capsule  Generic drug:  diltiazem  TAKE 1 CAPSULE BY MOUTH DAILY     DIGOX 0.125 MG tablet  Generic drug:  digoxin  TAKE 1 TABLET BY MOUTH EVERY DAY     lipase/protease/amylase [address] units Cpep capsule  Commonly known as:  CREON  Take 48,000 Units by mouth 3 (three) times daily before meals.     metoprolol 50 MG tablet  Commonly known as:  LOPRESSOR  TAKE 1 1/2 TABLETS BY MOUTH TWICE DAILY     mirabegron ER 25 MG Tb24 tablet  Commonly known as:  MYRBETRIQ  Take 25 mg by mouth daily.     Oxycodone HCl 10 MG Tabs  Take 1 tablet (10 mg total) by mouth every 4 (four) hours while awake.     pantoprazole 40 MG tablet  Commonly known as:  PROTONIX  Take 40 mg by mouth daily.     polyethylene glycol packet  Commonly known as:  MIRALAX / GLYCOLAX  Take 17 g by mouth 2 (two) times daily.     TRAVATAN Z 0.004 % Soln ophthalmic solution  Generic drug:  Travoprost (BAK Free)  Place 1 drop into both eyes at bedtime.     Vitamin D 2000 units tablet  Take 2,000 Units by mouth daily.     warfarin 5 MG tablet  Commonly known as:  COUMADIN  Take 5-7.5 mg by mouth daily. Take 5 mg on Sun / Tues / Wed / Fri / Sat Take 7.5 mg on Mon / Thurs        Discharge Instructions: Please refer to Patient Instructions section of EMR for full details.  Patient was counseled important signs and symptoms that should prompt return to medical care, changes in medications, dietary instructions, activity restrictions, and follow up appointments.   Follow-Up Appointments: Follow-up Information    Follow up with [facility].   Why:  Wheelchair and tub bench given to grandson who took it  home. BSC to be delivered to room    Contact information:    [address] [place] [address] [phone]       Follow up with [facility].   Why:  Home Health PT and OT will call you in the next 1-2 days to set up your home health visits.   Contact information:   [address] [place] [address] [phone]       Follow up with MURPHY, TIMOTHY D, MD In 2 weeks.   Specialty:  Orthopedic Surgery   Contact information:   [address] [place] [address] [phone]       Follow up with Saralyn Pilar, DO. Go on 05/31/2015.   Specialty:  Osteopathic Medicine   Why:  Follow up at 11:45 ON 5/11.    Contact information:   [address] [place] [address] [phone]       Yolande Jolly, MD 05/25/2015, 3:28 PM PGY-2, [facility]

## 2015-05-25 NOTE — Progress Notes (Signed)
Family Medicine Teaching Service Daily Progress Note Intern Pager: [phone]  [name] Medical record number: [id number] Date of birth: 1935-07-01 Age: 80 y.o. Gender: female  Primary Care Provider: Denny LevySara Neal, MD Consultants: Ortho Code Status: Full  Pt Overview and Major Events to Date:  Left tibial plateau fracture   Assessment and Plan: [name] is a 80 y.o. female admitted on 5/2 with Left tibial plateau fracture. PMH is significant for chronic atrial fibrillation, GERD, Osteoporosis, S/P Mitral Replacement, Chronic Anticoagulation.   # Left Tibial Plateau Fracture / L Leg Hematoma - Displacement 2.665mm on CT scan.  -Potential d/c today awaiting placement vs. d/c home w/ home health aid recommendations  -Given pt's concerns about going home (specifically overnight concerns) and PT's evaluation of home health aid and 24 hours supervision, which patient states may not be feasible, social work consult ordered for d/c.  - Pain controlled. NWB.  - On PO oxycodone dose to 10 mg Q4 hours - Anemia stable.   - Hematoma stable w/ ace wrap.  - Knee immobilizer in place  - Elevation as able  #Constipation - Miralax scheduled - Senokot added  #Blister on surface of left leg hematoma - Ace bandage.  - Wound care consulted > recs foam dressing. Placed upon d/c.   # AKI on CKD - resolved.   - Follow as O/P.   # Atrial Fibrillation S/P Mitral Replacement - chronically in afib. Rate controlled. Anticoagulated with coumadin. Follows with cards.  - Continue Digox 125mcg daily. Dig level appropriate.  - Lopressor 50mg  BID - Coumadin dosing per pharm.   # GERD  - Protonix 40mg  daily.   FEN/GI: Heart heathy diet Prophylaxis: Anticoagulated.   Disposition: Pending further evaluation.   Review Of Systems: Per HPI with the following additions: None.  Otherwise the remainder of the systems were negative.  Subjective:  She slept well. No acute overnight  events. Her pain is better controlled, being at a 8/10 at its worst when she moves and 2/10 when she keeps leg still.   Today she denies tingling or numbness, specifically in her legs; denies nausea, CP, SOB, or difficulty breathing.  Her last BM was prior to admission. Of note, patient voiced concerns about going home, even with home health aid.  She states her daugther and herself discussed going to a rehab place/ transitional facility before going home.   Objective: Temp:  [98.5 F (36.9 C)-98.8 F (37.1 C)] 98.5 F (36.9 C) (05/05 0553) Pulse Rate:  [71-110] 85 (05/05 0553) Resp:  [17-18] 18 (05/05 0553) BP: (127-155)/(57-81) 144/57 mmHg (05/05 0553) SpO2:  [97 %-98 %] 98 % (05/05 0553) Weight:  [82.4 kg (181 lb 10.5 oz)] 82.4 kg (181 lb 10.5 oz) (05/05 0500)   Physical Exam: General: Hard of hearing- wears hearing aid on R ear.  Cardiovascular: Clicking sound heard peristernal bilaterally, but best at lower left sternal border. No other murmurs or gallops appreciated on exam. No S3 or S4 appreciated. PMI non-displaced.  MSK: Swelling above knee is decreased as it tenderness. Swelling below knee slightly improved. Ace wrapped. +2 dorsalis pedis and posterior tibialis pulses on L leg distally from injury . Blister on top of hematoma.  Extremities: No pitting edema in either leg. +2 dorsalis pedis pulses bilaterally. Sensation intact distally to injury   Laboratory:  Recent Labs Lab 05/22/15 1507 05/23/15 0552 05/24/15 0856  WBC 8.8 6.2  --   HGB 11.5* 10.3* 10.8*  HCT 36.5 33.0* 36.0  PLT 189 151  --     Recent Labs Lab 05/22/15 1507 05/23/15 0552 05/24/15 0600  NA 141 140 141  K 3.6 3.8 3.8  CL 108 108 109  CO2 BUN 26* 18 13  CREATININE 1.78* 1.42* 1.20*  CALCIUM 9.7 8.9 8.6*  GLUCOSE 109* 112* 110*   INR 3.04, PT 30.9   Imaging/Diagnostic Tests: No results found.  Crissie Reese, Med Student- Pager [phone] 05/25/2015, 7:30 AM MS4, [facility]  Family Medicine FPTS Intern pager: [phone], text pages welcome  I agree with the above evaluation, assessment, and plan. Any correctional changes can be noted in [facility].   Yolande Jolly, MD Family Medicine Resident - PGY 2

## 2015-05-25 NOTE — Progress Notes (Signed)
    Assessment/Plan: Patient Active Problem List   Diagnosis Date Noted  . Tibial plateau fracture 05/22/2015  . Healthcare maintenance 04/30/2015  . Encounter for therapeutic drug monitoring 02/15/2013  . Long term (current) use of anticoagulants 05/03/2010  . URINARY FREQUENCY 05/24/2008  . RBBB 02/16/2008  . VENTRICULAR TACHYCARDIA 02/16/2008  . PROSTHETIC VALVE-MECHANICAL 02/16/2008  . ANEMIA, IRON DEFICIENCY, UNSPEC. 03/19/2006  . HYPERTENSION, BENIGN SYSTEMIC 03/19/2006  . ATRIAL FIBRILLATION 03/19/2006  . RHINITIS, ALLERGIC 03/19/2006  . REFLUX ESOPHAGITIS 03/19/2006  . OSTEOARTHRITIS, LOWER LEG 03/19/2006  . OSTEOPOROSIS, UNSPECIFIED 03/19/2006   Tibial plateau fracture Left Leg Hematoma stable.  Compartments soft, distally NVI. AFVSN.  H/H stable.  Pain better controlled.   WOC recommends silicone dressing for small superficial blister over area.   Plan: Non-op treatment of plateau fx NWB LLE Knee immobilizer full time Ice, ace wrap, elevate. Mobilize with PT - PT recommends HHPT F/u in office in 2wks.   Subjective: Patient reports pain as moderate / decreased since yesterday - better control.  No CP, SOB.  Objective:   VITALS:   Filed Vitals:   05/24/15 1356 05/24/15 2127 05/25/15 0500 05/25/15 0553  BP: 127/62 155/81  144/57  Pulse: 110 71  85  Temp: 98.8 F (37.1 C) 98.5 F (36.9 C)  98.5 F (36.9 C)  TempSrc:    Oral  Resp: 17 18  18   Height:      Weight:   82.4 kg (181 lb 10.5 oz)   SpO2: 97% 97%  98%    Lab Results  Component Value Date   WBC 6.2 05/23/2015   HGB 10.8* 05/24/2015   HCT 36.0 05/24/2015   MCV 88.2 05/23/2015   PLT 151 05/23/2015   BMET    Component Value Date/Time   NA 141 05/24/2015 0600   K 3.8 05/24/2015 0600   CL 109 05/24/2015 0600   CO2 22 05/24/2015 0600   GLUCOSE 110* 05/24/2015 0600   BUN 13 05/24/2015 0600   CREATININE 1.20* 05/24/2015 0600   CREATININE 1.44* 04/30/2015 1101   CALCIUM 8.6* 05/24/2015  0600   GFRNONAA 42* 05/24/2015 0600   GFRAA 49* 05/24/2015 0600    Physical Exam Gen: Uncomfortable with movement of leg, NAD.  Ace with Ice pack in place.  Neuro: Pleasant, Conversant, Alert ad oriented. MSK: Neurovascular intact distally Pain w/ knee ROM Sensation intact distally Intact DP pulses  Left lateral hematoma w/ mild ecchymosis stable.  Silicone dressing in place over small superficial blister.   Tami KernHenry Calvin Martensen III, PA-C 05/25/2015, 7:42 AM

## 2015-05-25 NOTE — Progress Notes (Signed)
Last minute SNF discharge: Patient was recommended for Home Health, but now feels as though SNF would be more beneficial since patient's daughter works all day. CSW spoke with patient and patient's daughter. SNF choice is [facility].   [name] LCSWA [phone]

## 2015-05-25 NOTE — Progress Notes (Signed)
Physical Therapy Treatment Patient Details Name: [name] MRN: [id number] DOB: [birth year] Today's Date: 05/25/2015    History of Present Illness [name] is a 80 y.o. female admitted on 5/2 with Left tibial plateau fracture. PMH is significant for chronic atrial fibrillation, GERD, Osteoporosis, S/P Mitral Replacement, Chronic Anticoagulation.    PT Comments    Patient is making gradual progress toward mobility goals. Currently min/mod A for OOB mobility. Pt will need 24 hour assist at home which pt reports is available from daughters and grandson. Current plan remains appropriate.   Follow Up Recommendations  Home health PT;Supervision/Assistance - 24 hour ((HH aide))     Equipment Recommendations  Rolling walker with 5" wheels;Wheelchair (measurements PT);Wheelchair cushion (measurements PT);Other (comment);3in1 (PT)    Recommendations for Other Services       Precautions / Restrictions Precautions Precautions: Fall Required Braces or Orthoses: Knee Immobilizer - Left Knee Immobilizer - Left: On at all times Restrictions Weight Bearing Restrictions: Yes LLE Weight Bearing: Non weight bearing    Mobility  Bed Mobility Overal bed mobility: Needs Assistance Bed Mobility: Supine to Sit     Supine to sit: Min guard     General bed mobility comments: cues for sequencing and use of bedrails; pt using UE to bring L LE to EOB  Transfers Overall transfer level: Needs assistance Equipment used: Rolling walker (2 wheeled) Transfers: Sit to/from Stand Sit to Stand: Mod assist;Min assist         General transfer comment: mod A from EOB and min A from [facility] with cues for technique and hand placement; pt able to maintain NWB status; increased time to gain balance upon standing and for safe use of DME  Ambulation/Gait Ambulation/Gait assistance: Min assist Ambulation Distance (Feet): 20 Feet Assistive device: Rolling walker (2 wheeled) Gait Pattern/deviations:  Step-to pattern     General Gait Details: cues for safe use of DME, posture, and NWB status; one seated rest break needed; unsteady at times with one posterior LOB with min A required   Stairs            Wheelchair Mobility    Modified Rankin (Stroke Patients Only)       Balance Overall balance assessment: Needs assistance   Sitting balance-Leahy Scale: Good     Standing balance support: Bilateral upper extremity supported Standing balance-Leahy Scale: Poor                      Cognition Arousal/Alertness: Awake/alert Behavior During Therapy: WFL for tasks assessed/performed Overall Cognitive Status: No family/caregiver present to determine baseline cognitive functioning Area of Impairment: Problem solving       Following Commands: Follows multi-step commands with increased time     Problem Solving: Slow processing;Requires verbal cues      Exercises General Exercises - Lower Extremity Ankle Circles/Pumps: AROM;Both;10 reps;Seated Hip ABduction/ADduction: AROM;Left;10 reps;Seated Straight Leg Raises: AAROM;Left;5 reps;Seated    General Comments        Pertinent Vitals/Pain Pain Assessment: Faces Faces Pain Scale: No hurt Pain Intervention(s): Monitored during session    Home Living                      Prior Function            PT Goals (current goals can now be found in the care plan section) Acute Rehab PT Goals Patient Stated Goal: To go home with daughter's assist Progress towards PT goals: Progressing toward goals  Frequency  Min 5X/week    PT Plan Current plan remains appropriate    Co-evaluation             End of Session Equipment Utilized During Treatment: Gait belt;Left knee immobilizer Activity Tolerance: Patient tolerated treatment well Patient left: in chair;with call bell/phone within reach;with chair alarm set     Time: 1325-1402 PT Time Calculation (min) (ACUTE ONLY): 37 min  Charges:   $Gait Training: 23-37 mins                    G Codes:      Derek MoundKellyn R Airi Copado Maxmilian Trostel, PTA Pager: [phone]   05/25/2015, 2:49 PM

## 2015-05-25 NOTE — Progress Notes (Signed)
Patient will DC to: [facility] Anticipated DC date: 05/25/15 Family notified: Daughter Transport by: [facility]   Per MD patient ready for DC to [facility]. RN, patient, patient's family, and facility notified of DC. RN given number for report. DC packet on chart. Ambulance transport requested for patient.   CSW signing off.  Orchid Glassberg, LCSWA Clinical Social Worker [phone]  

## 2015-05-25 NOTE — Clinical Social Work Placement (Signed)
   CLINICAL SOCIAL WORK PLACEMENT  NOTE  Date:  05/25/2015  Patient Details  Name: [name] MRN: [id number] Date of Birth: 1935-04-29  Clinical Social Work is seeking post-discharge placement for this patient at the Skilled  Nursing Facility level of care (*CSW will initial, date and re-position this form in  chart as items are completed):  Yes   Patient/family provided with [facility] Clinical Social Work Department's list of facilities offering this level of care within the geographic area requested by the patient (or if unable, by the patient's family).  Yes   Patient/family informed of their freedom to choose among providers that offer the needed level of care, that participate in Medicare, Medicaid or managed care program needed by the patient, have an available bed and are willing to accept the patient.  Yes   Patient/family informed of [facility]'s ownership interest [facility] and [facility], as well as of the fact that they are under no obligation to receive care at these facilities.  PASRR submitted to EDS on 05/25/15     PASRR number received on 05/25/15     Existing PASRR number confirmed on       FL2 transmitted to all facilities in geographic area requested by pt/family on 05/25/15     FL2 transmitted to all facilities within larger geographic area on       Patient informed that his/her managed care company has contracts with or will negotiate with certain facilities, including the following:        Yes   Patient/family informed of bed offers received.  [facility]     Physician recommends and patient chooses bed at      [facility].  Patient to be transferred to facility by [facility]     Patient family notified on 05/25/15 of transfer.  Name of family member notified:  Daughter     PHYSICIAN       Additional Comment:     _______________________________________________ [name], LCSWA 05/25/2015, 4:24 PM

## 2015-05-28 ENCOUNTER — Encounter: Payer: Self-pay | Admitting: Internal Medicine

## 2015-05-28 ENCOUNTER — Non-Acute Institutional Stay (SKILLED_NURSING_FACILITY): Payer: Medicare Other | Admitting: Internal Medicine

## 2015-05-28 DIAGNOSIS — I10 Essential (primary) hypertension: Secondary | ICD-10-CM | POA: Diagnosis not present

## 2015-05-28 DIAGNOSIS — Y92009 Unspecified place in unspecified non-institutional (private) residence as the place of occurrence of the external cause: Secondary | ICD-10-CM

## 2015-05-28 DIAGNOSIS — S82142D Displaced bicondylar fracture of left tibia, subsequent encounter for closed fracture with routine healing: Secondary | ICD-10-CM

## 2015-05-28 DIAGNOSIS — I482 Chronic atrial fibrillation, unspecified: Secondary | ICD-10-CM

## 2015-05-28 DIAGNOSIS — Z7901 Long term (current) use of anticoagulants: Secondary | ICD-10-CM

## 2015-05-28 DIAGNOSIS — D509 Iron deficiency anemia, unspecified: Secondary | ICD-10-CM | POA: Diagnosis not present

## 2015-05-28 DIAGNOSIS — Y92099 Unspecified place in other non-institutional residence as the place of occurrence of the external cause: Secondary | ICD-10-CM | POA: Diagnosis not present

## 2015-05-28 DIAGNOSIS — W19XXXA Unspecified fall, initial encounter: Secondary | ICD-10-CM

## 2015-05-28 DIAGNOSIS — Z954 Presence of other heart-valve replacement: Secondary | ICD-10-CM | POA: Diagnosis not present

## 2015-05-28 DIAGNOSIS — Z952 Presence of prosthetic heart valve: Secondary | ICD-10-CM

## 2015-05-28 NOTE — Progress Notes (Signed)
HISTORY AND PHYSICAL   DATE: 05/28/15  Location:  [facility] Room Number: 004 Place of Service: SNF (31)   Extended Emergency Contact Information Primary Emergency Contact: [place] Mobile Phone: [phone] Relation: Daughter Secondary Emergency Contact: Gertie Exon Address: [address], [place] [address] [place] Mobile Phone: [phone] Relation: Daughter  Advanced Directive information Does patient have an advance directive?: No, Would patient like information on creating an advanced directive?: No - patient declined information FULL CODE Chief Complaint  Patient presents with  . New Admit To SNF    HPI:  80 yo female seen today as a new admission into SNF following hospital stay for left tibial plateau fx, LLE hematoma, s/p fall,AKI, afib, hx MVR. She presented to the ED after fall in her shower on 05/22/15. In ED, xrays revealed left tibial plateaufx with 2.53m depression. AKI tx with IVF. She developed LLE hematoma and blisters which was followed by wound care. Ortho recommended nonoperative mx of fx. Coumadin cont due to MVR and hx afib. H/H 10.8/36; Cr 1.78-->1.2 at d/c. She presents to SNF for short term rehab.  She reports pain in her LLE but it is controlled with oxycodone. No nursing issues. No falls since admission. No bleeding. Appetite ok.   Afib/hx MVR/HTN/hx DCM/hxNSVT - rate controlled on cartia xt, lopressor and digoxin. She takes coumadin for anticoagulation. Goal INR due to MVR is 2.5-3.5. Followed by cardio  GERD - stable on protonix. Takes creon  Urinary frequency - stable on mybetriq  Glaucoma - stable on travatan gtts and brinzolamide-brimonidine gtts. Followed by eye specialist  Anemia - Hgb stable   Past Medical History  Diagnosis Date  . Hypertension   . GI bleed   . Chronic atrial fibrillation ([facility]   . Dilated cardiomyopathy ([facility]     history  of this, now resolved  . GERD (gastroesophageal reflux disease)   . Dysphagia     Hx  . Nonsustained ventricular tachycardia ([facility]   . Osteoporosis   . Microcytic anemia   . Arrhythmia     chronic afib NSVT  . RBBB (right bundle branch block)   . Heart murmur   . Asthma     years ago  . History of blood transfusion   . Protein in urine   . Hemorrhoid 04/2014    bleeding  . Fracture of tibial plateau 05/21/2015    Past Surgical History  Procedure Laterality Date  . Cholecystectomy    . Mitral valve prosthesis with angioplasty  1987  . Tubal ligation    . Transthoracic echocardiogram  02/2008, 03/2006, 06/2004  . Polyp removal    . Colonoscopy    . Trabeculectomy Right 05/17/2014    Procedure: TRABECULECTOMY WITH MITOMYCIN RIGHT EYE;  Surgeon: RMarylynn Pearson MD;  [place] [facility]  Service: Ophthalmology;  Laterality: Right;  . Mitomycin c application Right 46/83/7290   Procedure: MITOMYCIN C APPLICATION;  Surgeon: RMarylynn Pearson MD;  [place] [facility]  Service: Ophthalmology;  Laterality: Right;    Patient Care Team: SDickie La MD as PCP - General (Family Medicine)  Social History   Social History  . Marital Status: Divorced    Spouse Name: N/A  . Number of Children: N/A  . Years of Education: N/A   Occupational History  . Not on file.   Social History Main Topics  . Smoking  status: Former Research scientist (life sciences)  . Smokeless tobacco: Never Used     Comment: quiit 1987  . Alcohol Use: No  . Drug Use: No  . Sexual Activity: Not on file   Other Topics Concern  . Not on file   Social History Narrative     reports that she has quit smoking. She has never used smokeless tobacco. She reports that she does not drink alcohol or use illicit drugs.  Family History  Problem Relation Age of Onset  . Hypertension Mother   . Colon cancer Neg Hx   . Cancer Sister     in the Bladder   Family Status  Relation Status Death Age  . Father Deceased     prostate cancer    Immunization  History  Administered Date(s) Administered  . Influenza,inj,Quad PF,36+ Mos 10/25/2013  . Pneumococcal Polysaccharide-23 09/24/2002  . Td 07/20/2001    Allergies  Allergen Reactions  . Esomeprazole Magnesium     REACTION: stomach upset, nausea    Medications: Patient's Medications  New Prescriptions   No medications on file  Previous Medications   BRINZOLAMIDE-BRIMONIDINE 1-0.2 % SUSP    Place 1 drop into the left eye 3 (three) times daily.    CARTIA XT 180 MG 24 HR CAPSULE    TAKE 1 CAPSULE BY MOUTH DAILY   CHOLECALCIFEROL (VITAMIN D) 2000 UNITS TABLET    Take 2,000 Units by mouth daily.   DIGOX 125 MCG TABLET    TAKE 1 TABLET BY MOUTH EVERY DAY   LIPASE/PROTEASE/AMYLASE (CREON) 12000 UNITS CPEP CAPSULE    Take 48,000 Units by mouth 3 (three) times daily before meals.   METOPROLOL (LOPRESSOR) 50 MG TABLET    TAKE 1 1/2 TABLETS BY MOUTH TWICE DAILY   MIRABEGRON ER (MYRBETRIQ) 25 MG TB24 TABLET    Take 25 mg by mouth daily.   OXYCODONE 10 MG TABS    Take 1 tablet (10 mg total) by mouth every 4 (four) hours while awake.   PANTOPRAZOLE (PROTONIX) 40 MG TABLET    Take 40 mg by mouth daily.    POLYETHYLENE GLYCOL (MIRALAX / GLYCOLAX) PACKET    Take 17 g by mouth 2 (two) times daily.   TRAVATAN Z 0.004 % SOLN OPHTHALMIC SOLUTION    Place 1 drop into both eyes at bedtime.    WARFARIN (COUMADIN) 6 MG TABLET    Take 6 mg by mouth daily.  Modified Medications   No medications on file  Discontinued Medications   WARFARIN (COUMADIN) 5 MG TABLET    Take 5-7.5 mg by mouth daily. Take 5 mg on Sun / Tues / Wed / Fri / Sat Take 7.5 mg on Mon / Thurs    Review of Systems  Constitutional: Positive for fatigue.  Cardiovascular: Positive for leg swelling.  Musculoskeletal: Positive for joint swelling and gait problem.  Neurological: Positive for weakness.  All other systems reviewed and are negative.   Filed Vitals:   05/28/15 0945  BP: 145/68  Pulse: 96  Temp: 98 F (36.7 C)  TempSrc:  Oral  Resp: 18  Height: '5\' 7"'  (1.702 m)  Weight: 181 lb 9.6 oz (82.373 kg)   Body mass index is 28.44 kg/(m^2).  Physical Exam  Constitutional: She is oriented to person, place, and time. She appears well-developed and well-nourished.  Sitting in chair in NAD. Looks uncomfortable  HENT:  Mouth/Throat: Oropharynx is clear and moist. No oropharyngeal exudate.  Eyes: Pupils are equal, round, and reactive to light.  No scleral icterus.  Neck: Neck supple. Carotid bruit is not present. No tracheal deviation present. No thyromegaly present.  Cardiovascular: Normal rate, regular rhythm and intact distal pulses.  Exam reveals no gallop and no friction rub.   Murmur (1/6 SEM) heard. Mitral click noted. Trace RLE edema. LLE immobilizer intact. No right calf TTP  Pulmonary/Chest: Effort normal and breath sounds normal. No stridor. No respiratory distress. She has no wheezes. She has no rales.  Abdominal: Soft. Bowel sounds are normal. She exhibits mass. She exhibits no distension. There is no hepatomegaly. There is no tenderness. There is no rebound and no guarding.  Musculoskeletal: She exhibits edema and tenderness.  FROM toes in LLE. Left leg immobilizer intact  Lymphadenopathy:    She has no cervical adenopathy.  Neurological: She is alert and oriented to person, place, and time.  Skin: Skin is warm and dry. No rash noted.  LLE hematoma per wound care. No secondary signs of infection  Psychiatric: She has a normal mood and affect. Her behavior is normal. Judgment and thought content normal.     Labs reviewed: Admission on 05/22/2015, Discharged on 05/25/2015  Component Date Value Ref Range Status  . WBC 05/22/2015 8.8  4.0 - 10.5 K/uL Final  . RBC 05/22/2015 4.25  3.87 - 5.11 MIL/uL Final  . Hemoglobin 05/22/2015 11.5* 12.0 - 15.0 g/dL Final  . HCT 05/22/2015 36.5  36.0 - 46.0 % Final  . MCV 05/22/2015 85.9  78.0 - 100.0 fL Final  . MCH 05/22/2015 27.1  26.0 - 34.0 pg Final  . MCHC  05/22/2015 31.5  30.0 - 36.0 g/dL Final  . RDW 05/22/2015 14.8  11.5 - 15.5 % Final  . Platelets 05/22/2015 189  150 - 400 K/uL Final  . Sodium 05/22/2015 141  135 - 145 mmol/L Final  . Potassium 05/22/2015 3.6  3.5 - 5.1 mmol/L Final  . Chloride 05/22/2015 108  101 - 111 mmol/L Final  . CO2 05/22/2015 23  22 - 32 mmol/L Final  . Glucose, Bld 05/22/2015 109* 65 - 99 mg/dL Final  . BUN 05/22/2015 26* 6 - 20 mg/dL Final  . Creatinine, Ser 05/22/2015 1.78* 0.44 - 1.00 mg/dL Final  . Calcium 05/22/2015 9.7  8.9 - 10.3 mg/dL Final  . GFR calc non Af Amer 05/22/2015 26* >60 mL/min Final  . GFR calc Af Amer 05/22/2015 31* >60 mL/min Final   Comment: (NOTE) The eGFR has been calculated using the CKD EPI equation. This calculation has not been validated in all clinical situations. eGFR's persistently <60 mL/min signify possible Chronic Kidney Disease.   . Anion gap 05/22/2015 10  5 - 15 Final  . Prothrombin Time 05/22/2015 29.6* 11.6 - 15.2 seconds Final  . INR 05/22/2015 2.87* 0.00 - 1.49 Final  . Sodium 05/23/2015 140  135 - 145 mmol/L Final  . Potassium 05/23/2015 3.8  3.5 - 5.1 mmol/L Final  . Chloride 05/23/2015 108  101 - 111 mmol/L Final  . CO2 05/23/2015 22  22 - 32 mmol/L Final  . Glucose, Bld 05/23/2015 112* 65 - 99 mg/dL Final  . BUN 05/23/2015 18  6 - 20 mg/dL Final  . Creatinine, Ser 05/23/2015 1.42* 0.44 - 1.00 mg/dL Final  . Calcium 05/23/2015 8.9  8.9 - 10.3 mg/dL Final  . GFR calc non Af Amer 05/23/2015 35* >60 mL/min Final  . GFR calc Af Amer 05/23/2015 40* >60 mL/min Final   Comment: (NOTE) The eGFR has been calculated using the CKD  EPI equation. This calculation has not been validated in all clinical situations. eGFR's persistently <60 mL/min signify possible Chronic Kidney Disease.   . Anion gap 05/23/2015 10  5 - 15 Final  . WBC 05/23/2015 6.2  4.0 - 10.5 K/uL Final  . RBC 05/23/2015 3.74* 3.87 - 5.11 MIL/uL Final  . Hemoglobin 05/23/2015 10.3* 12.0 - 15.0 g/dL  Final  . HCT 05/23/2015 33.0* 36.0 - 46.0 % Final  . MCV 05/23/2015 88.2  78.0 - 100.0 fL Final  . MCH 05/23/2015 27.5  26.0 - 34.0 pg Final  . MCHC 05/23/2015 31.2  30.0 - 36.0 g/dL Final  . RDW 05/23/2015 14.9  11.5 - 15.5 % Final  . Platelets 05/23/2015 151  150 - 400 K/uL Final  . Glucose-Capillary 05/23/2015 97  65 - 99 mg/dL Final  . Digoxin Level 05/23/2015 1.2  0.8 - 2.0 ng/mL Final  . Digoxin Level 05/23/2015 1.3  0.8 - 2.0 ng/mL Final  . Prothrombin Time 05/24/2015 32.9* 11.6 - 15.2 seconds Final  . INR 05/24/2015 3.31* 0.00 - 1.49 Final  . Sodium 05/24/2015 141  135 - 145 mmol/L Final  . Potassium 05/24/2015 3.8  3.5 - 5.1 mmol/L Final  . Chloride 05/24/2015 109  101 - 111 mmol/L Final  . CO2 05/24/2015 22  22 - 32 mmol/L Final  . Glucose, Bld 05/24/2015 110* 65 - 99 mg/dL Final  . BUN 05/24/2015 13  6 - 20 mg/dL Final  . Creatinine, Ser 05/24/2015 1.20* 0.44 - 1.00 mg/dL Final  . Calcium 05/24/2015 8.6* 8.9 - 10.3 mg/dL Final  . GFR calc non Af Amer 05/24/2015 42* >60 mL/min Final  . GFR calc Af Amer 05/24/2015 49* >60 mL/min Final   Comment: (NOTE) The eGFR has been calculated using the CKD EPI equation. This calculation has not been validated in all clinical situations. eGFR's persistently <60 mL/min signify possible Chronic Kidney Disease.   . Anion gap 05/24/2015 10  5 - 15 Final  . Glucose-Capillary 05/24/2015 116* 65 - 99 mg/dL Final  . Glucose-Capillary 05/24/2015 96  65 - 99 mg/dL Final  . Hemoglobin 05/24/2015 10.8* 12.0 - 15.0 g/dL Final  . HCT 05/24/2015 36.0  36.0 - 46.0 % Final  . Prothrombin Time 05/25/2015 30.9* 11.6 - 15.2 seconds Final  . INR 05/25/2015 3.04* 0.00 - 1.49 Final  . Glucose-Capillary 05/25/2015 83  65 - 99 mg/dL Final  Anti-coag visit on 04/30/2015  Component Date Value Ref Range Status  . INR 04/30/2015 3.5   Final  Office Visit on 04/30/2015  Component Date Value Ref Range Status  . Sodium 04/30/2015 140  135 - 146 mmol/L Final  .  Potassium 04/30/2015 3.7  3.5 - 5.3 mmol/L Final  . Chloride 04/30/2015 104  98 - 110 mmol/L Final  . CO2 04/30/2015 25  20 - 31 mmol/L Final  . Glucose, Bld 04/30/2015 96  65 - 99 mg/dL Final  . BUN 04/30/2015 18  7 - 25 mg/dL Final  . Creat 04/30/2015 1.44* 0.60 - 0.93 mg/dL Final  . Total Bilirubin 04/30/2015 0.6  0.2 - 1.2 mg/dL Final  . Alkaline Phosphatase 04/30/2015 51  33 - 130 U/L Final  . AST 04/30/2015 18  10 - 35 U/L Final  . ALT 04/30/2015 15  6 - 29 U/L Final  . Total Protein 04/30/2015 8.3* 6.1 - 8.1 g/dL Final  . Albumin 04/30/2015 4.3  3.6 - 5.1 g/dL Final  . Calcium 04/30/2015 10.0  8.6 - 10.4  mg/dL Final  . WBC 04/30/2015 6.6  3.8 - 10.8 K/uL Final  . RBC 04/30/2015 4.37  3.80 - 5.10 MIL/uL Final  . Hemoglobin 04/30/2015 11.7  11.7 - 15.5 g/dL Final  . HCT 04/30/2015 36.9  35.0 - 45.0 % Final  . MCV 04/30/2015 84.4  80.0 - 100.0 fL Final  . MCH 04/30/2015 26.8* 27.0 - 33.0 pg Final  . MCHC 04/30/2015 31.7* 32.0 - 36.0 g/dL Final  . RDW 04/30/2015 15.3* 11.0 - 15.0 % Final  . Platelets 04/30/2015 237  140 - 400 K/uL Final  . MPV 04/30/2015 10.0  7.5 - 12.5 fL Final   ** Please note change in unit of measure and reference range(s). **  Admission on 04/18/2015, Discharged on 04/18/2015  Component Date Value Ref Range Status  . Sodium 04/18/2015 138  135 - 145 mmol/L Final  . Potassium 04/18/2015 3.9  3.5 - 5.1 mmol/L Final  . Chloride 04/18/2015 107  101 - 111 mmol/L Final  . CO2 04/18/2015 22  22 - 32 mmol/L Final  . Glucose, Bld 04/18/2015 99  65 - 99 mg/dL Final  . BUN 04/18/2015 25* 6 - 20 mg/dL Final  . Creatinine, Ser 04/18/2015 1.69* 0.44 - 1.00 mg/dL Final  . Calcium 04/18/2015 9.3  8.9 - 10.3 mg/dL Final  . GFR calc non Af Amer 04/18/2015 28* >60 mL/min Final  . GFR calc Af Amer 04/18/2015 33* >60 mL/min Final   Comment: (NOTE) The eGFR has been calculated using the CKD EPI equation. This calculation has not been validated in all clinical  situations. eGFR's persistently <60 mL/min signify possible Chronic Kidney Disease.   . Anion gap 04/18/2015 9  5 - 15 Final  . WBC 04/18/2015 5.8  4.0 - 10.5 K/uL Final  . RBC 04/18/2015 4.15  3.87 - 5.11 MIL/uL Final  . Hemoglobin 04/18/2015 11.5* 12.0 - 15.0 g/dL Final  . HCT 04/18/2015 35.4* 36.0 - 46.0 % Final  . MCV 04/18/2015 85.3  78.0 - 100.0 fL Final  . MCH 04/18/2015 27.7  26.0 - 34.0 pg Final  . MCHC 04/18/2015 32.5  30.0 - 36.0 g/dL Final  . RDW 04/18/2015 14.4  11.5 - 15.5 % Final  . Platelets 04/18/2015 195  150 - 400 K/uL Final  . Troponin i, poc 04/18/2015 0.01  0.00 - 0.08 ng/mL Final  . Comment 3 04/18/2015          Final   Comment: Due to the release kinetics of cTnI, a negative result within the first hours of the onset of symptoms does not rule out myocardial infarction with certainty. If myocardial infarction is still suspected, repeat the test at appropriate intervals.   Anti-coag visit on 03/15/2015  Component Date Value Ref Range Status  . INR 03/15/2015 3.2   Final    Ct Knee Left Wo Contrast  05/22/2015  CLINICAL DATA:  Left knee tibial plateau fracture. Fall in the shower. EXAM: CT OF THE left KNEE WITHOUT CONTRAST TECHNIQUE: Multidetector CT imaging of the left knee was performed according to the standard protocol. Multiplanar CT image reconstructions were also generated. COMPARISON:  05/22/2015 FINDINGS: Schatzker II fracture of the medial tibial plateau, involving the anterior half of the plateau, with 2.5 mm depression of the involved fragment. The fracture exits anteromedially at about the level of the fused growth plate. Medial compartment old degenerative articular space narrowing with mild subcortical sclerosis due to osteoarthritis. Mild lateral compartmental articular space narrowing. Lipohemarthrosis.  No other fracture  observed. No additional significant findings. IMPRESSION: 1. Schatzker II fracture head involving the anterior half of the medial  tibial plateau with about 2.5 mm of depression of the involved fragment. 2. Underlying mild to moderate osteoarthritis especially in the medial compartment. 3. Lipohemarthrosis. Electronically Signed   By: Van Clines M.D.   On: 05/22/2015 15:06   Dg Knee Complete 4 Views Left  05/22/2015  CLINICAL DATA:  Pain following fall EXAM: LEFT KNEE - COMPLETE 4+ VIEW COMPARISON:  None. FINDINGS: Frontal, lateral, and bilateral oblique views were obtained. There is a fracture of the medial tibial plateau with slight displacement of fracture fragments. Along the anterior aspect of the medial tibial plateau, there is 2 mm of depression. There is a sizable joint effusion. No other fractures are evident. No dislocation. There is mild narrowing in the patellofemoral joint region in moderate narrowing medially. No erosive change. IMPRESSION: Medial tibial plateau fracture with 2 mm of depression anteriorly and slight separation of fracture fragments in the medial tibial plateau region. Sizable joint effusion. Moderate osteoarthritic change. No dislocation. Electronically Signed   By: Lowella Grip III M.D.   On: 05/22/2015 12:55     Assessment/Plan   ICD-9-CM ICD-10-CM   1. Tibial plateau fracture, left, closed, with routine healing, subsequent encounter V54.16 S82.142D   2. History of mitral valve replacement V43.3 Z95.4   3. ANEMIA,Follow INR with GOAL 2.5 -3.5 due to MVR. IRON DEFICIENCY, UNSPEC. 280.9 D50.9   4. HYPERTENSION, BENIGN SYSTEMIC 401.1 I10   5. Chronic atrial fibrillation (HCC) 427.31 I48.2   6. Fall at home, initial encounter [phone] W19.XXXA    E849.0 Y92.099   7. Long term (current) use of anticoagulants V58.61 Z79.01    GOAL INR 2.5-3.5    Follow INR with GOAL 2.5 -3.5 due to MVR. Next due on 5/9th  Check CBC, BMP  NWB on LLE until cleared by Ortho  F/u with Ortho as scheduled  Cont current meds as ordered  GOAL: short term rehab and d/c home when medically appropriate.  Communicated with pt and nursing.  Will follow  Elliana Bal S. Perlie Gold  [facility] [address] [place], [place] [address] [phone] Cell (Monday-Friday 8 AM - 5 PM) [phone] After 5 PM and follow prompts

## 2015-05-29 ENCOUNTER — Encounter: Payer: Self-pay | Admitting: Family Medicine

## 2015-05-29 LAB — PROTIME-INR
INR: 1.6 — AB (ref 0.9–1.1)
PT: 19.3

## 2015-05-30 ENCOUNTER — Encounter: Payer: Self-pay | Admitting: Family Medicine

## 2015-05-30 ENCOUNTER — Non-Acute Institutional Stay: Payer: Medicare Other | Admitting: Family Medicine

## 2015-05-30 DIAGNOSIS — N183 Chronic kidney disease, stage 3 unspecified: Secondary | ICD-10-CM

## 2015-05-30 DIAGNOSIS — I482 Chronic atrial fibrillation, unspecified: Secondary | ICD-10-CM

## 2015-05-30 DIAGNOSIS — K2100 Gastro-esophageal reflux disease with esophagitis, without bleeding: Secondary | ICD-10-CM

## 2015-05-30 DIAGNOSIS — S82142D Displaced bicondylar fracture of left tibia, subsequent encounter for closed fracture with routine healing: Secondary | ICD-10-CM

## 2015-05-30 DIAGNOSIS — Z954 Presence of other heart-valve replacement: Secondary | ICD-10-CM | POA: Diagnosis not present

## 2015-05-30 DIAGNOSIS — Z952 Presence of prosthetic heart valve: Secondary | ICD-10-CM

## 2015-05-30 DIAGNOSIS — M810 Age-related osteoporosis without current pathological fracture: Secondary | ICD-10-CM

## 2015-05-30 DIAGNOSIS — R350 Frequency of micturition: Secondary | ICD-10-CM

## 2015-05-30 DIAGNOSIS — D509 Iron deficiency anemia, unspecified: Secondary | ICD-10-CM | POA: Diagnosis not present

## 2015-05-30 DIAGNOSIS — Z593 Problems related to living in residential institution: Secondary | ICD-10-CM | POA: Diagnosis not present

## 2015-05-30 DIAGNOSIS — K529 Noninfective gastroenteritis and colitis, unspecified: Secondary | ICD-10-CM

## 2015-05-30 DIAGNOSIS — Z7901 Long term (current) use of anticoagulants: Secondary | ICD-10-CM

## 2015-05-30 DIAGNOSIS — I10 Essential (primary) hypertension: Secondary | ICD-10-CM

## 2015-05-30 NOTE — Progress Notes (Signed)
[facility]  H&P Admission Note  *Note patient initially admitted by Dr Kirt Boys with [facility] - now patient has been transferred to primary service of [facility] - Geriatrics Team (Dr McDiarmid to be attending physician*  Primary Care Provider: Denny Levy, MD Location of Care: [facility] Visit Information: New admission H&P Patient accompanied by daughter Janan Ridge) Source(s) of information for visit: patient and relative (daughter)  Chief Complaint:  Chief Complaint  Patient presents with  . Leg Injury    Left tibia fracture, with hematoma  . Anticoagulation    chronic atrial fibrillation and mechanical heart valve    Nursing Concerns: none  Behavioral Concerns: none  Nutrition Concerns: none  Wound Care Nurse Concerns: Left lower ext anterior below knee hematoma with blister formation (2/2 fracture and anticoagulation) - currently treated with foam dressing changes qod  PT Concerns and Goals: Acute Rehab PT - Non-weight bearing (NWB) Left LE (per Ortho) - concerns Left Tibial Plateau fracture with Acute pain and difficulty walking, generalized weakness from hospitalization. improved activity tolerance, improved balance and functional use of  LEFT lower extremity  OT Concerns and Goals: See above PT, no additional goals   If SNF admission, patient's goal for the rehabilitation admission:  Goals identified by the patient:;  increase overall strength and endurance and ambulate at home level with appropriate assistive device ; Independence    Family Goals: be able to care for self again and be able to walk again   If SNF admission, discharge disposition goals:  remain at home   HISTORY OF PRESENT ILLNESS: Outpatient Encounter Prescriptions as of 05/30/2015  Medication Sig  . Brinzolamide-Brimonidine 1-0.2 % SUSP Place 1 drop into the left eye 3 (three) times daily.   Marland Kitchen CARTIA XT 180 MG 24 hr capsule TAKE 1 CAPSULE BY  MOUTH DAILY  . Cholecalciferol (VITAMIN D) 2000 UNITS tablet Take 2,000 Units by mouth daily.  Marland Kitchen DIGOX 125 MCG tablet TAKE 1 TABLET BY MOUTH EVERY DAY  . lipase/protease/amylase (CREON) 12000 units CPEP capsule Take 48,000 Units by mouth 3 (three) times daily before meals.  . metoprolol (LOPRESSOR) 50 MG tablet TAKE 1 1/2 TABLETS BY MOUTH TWICE DAILY (Patient taking differently: TAKE 1.5 TABLETS ( ) BY MOUTH TWICE DAILY)  . mirabegron ER (MYRBETRIQ) 25 MG TB24 tablet Take 25 mg by mouth daily.  Marland Kitchen oxyCODONE 10 MG TABS Take 1 tablet (10 mg total) by mouth every 4 (four) hours while awake.  . pantoprazole (PROTONIX) 40 MG tablet Take 40 mg by mouth daily.   . polyethylene glycol (MIRALAX / GLYCOLAX) packet Take 17 g by mouth 2 (two) times daily.  . TRAVATAN Z 0.004 % SOLN ophthalmic solution Place 1 drop into both eyes at bedtime.   Marland Kitchen warfarin (COUMADIN) 6 MG tablet Take 6 mg by mouth daily.   Facility-Administered Encounter Medications as of 05/30/2015  Medication  . mitoMYcin (MUTAMYCIN) Injection Use in OR only (0.4 mg/ml)   Allergies  Allergen Reactions  . Esomeprazole Magnesium     REACTION: stomach upset, nausea   History Patient Active Problem List   Diagnosis Date Noted  . CKD (chronic kidney disease), stage III 05/31/2015  . Chronic diarrhea 05/31/2015  . Person living in residential institution 05/30/2015  . Tibial plateau fracture 05/22/2015  . Healthcare maintenance 04/30/2015  . Encounter for therapeutic drug monitoring 02/15/2013  . Long term (current) use of anticoagulants 05/03/2010  . URINARY FREQUENCY 05/24/2008  . RBBB 02/16/2008  .  VENTRICULAR TACHYCARDIA 02/16/2008  . History of mitral valve replacement 02/16/2008  . ANEMIA, IRON DEFICIENCY, UNSPEC. 03/19/2006  . HYPERTENSION, BENIGN SYSTEMIC 03/19/2006  . ATRIAL FIBRILLATION 03/19/2006  . RHINITIS, ALLERGIC 03/19/2006  . REFLUX ESOPHAGITIS 03/19/2006  . OSTEOARTHRITIS, LOWER LEG 03/19/2006  . Osteoporosis  03/19/2006   Past Medical History  Diagnosis Date  . Hypertension   . GI bleed   . Chronic atrial fibrillation (HCC)   . Dilated cardiomyopathy (HCC)     history of this, now resolved  . GERD (gastroesophageal reflux disease)   . Dysphagia     Hx  . Nonsustained ventricular tachycardia (HCC)   . Osteoporosis   . Microcytic anemia   . Arrhythmia     chronic afib NSVT  . RBBB (right bundle branch block)   . Heart murmur   . Asthma     years ago  . History of blood transfusion   . Protein in urine   . Hemorrhoid 04/2014    bleeding  . Fracture of tibial plateau 05/21/2015   Past Surgical History  Procedure Laterality Date  . Cholecystectomy    . Mitral valve prosthesis with angioplasty  1987  . Tubal ligation    . Transthoracic echocardiogram  02/2008, 03/2006, 06/2004  . Polyp removal    . Colonoscopy    . Trabeculectomy Right 05/17/2014    Procedure: TRABECULECTOMY WITH MITOMYCIN RIGHT EYE;  Surgeon: Chalmers Guest, MD;  Location: [facility] OR;  Service: Ophthalmology;  Laterality: Right;  . Mitomycin c application Right 05/17/2014    Procedure: MITOMYCIN C APPLICATION;  Surgeon: Chalmers Guest, MD;  Location: [facility] OR;  Service: Ophthalmology;  Laterality: Right;   Family History  Problem Relation Age of Onset  . Hypertension Mother   . Colon cancer Neg Hx   . Cancer Sister     in the Bladder    reports that she has quit smoking. She has never used smokeless tobacco. She reports that she does not drink alcohol or use illicit drugs.  Basic Activities of Daily Living   ADLs Independent Needs Assistance Dependent  Bathing X    Dressing X    Ambulation  X (L-tibial fracture, rehab)   Toileting X    Eating X       Instrumental Activities of Daily Living  IADL Independent Needs Assistance Dependent  Cooking X    Housework X    Manage Medications X    Manage the telephone X    Shopping for food, clothes, Meds, etc X    Use transportation X    Manage Finances X      Falls  in the past six months:   Yes x 1  Diet:  low sodium, heart healthy Feeding Tube: No Nourishment:  Good  Hydration Status: well hydrated  Nutritional Supplements:  Medpass: no Magic Cup:no  Prostat:no  Juven:no   Communication Barriers: Difficulty Hearing (R-ear, hearing aid)  Review of Systems  Patient has ability to communicate answers to ROS: yes See HPI General: Denies fevers, chills, progressive fatigue, weight gain.  Eyes: Denies pain, blurred vision  Ears/Nose/Throat: Denies ear pain, throat pain, rhinorrhea, nasal congestion.  Cardiovascular: Denies chest pains, palpitations, dyspnea on exertion, orthopnea, peripheral edema.  Respiratory: Denies cough, sputum, dyspnea  Gastrointestinal: Denies abdominal pain, bloating, constipation, diarrhea.  Genitourinary: Denies dysuria, urinary frequency, discharge Musculoskeletal: Admits pain Left lower extremity, swelling, and bruising.  Skin: Admits blister/hematoma Left lower ext. Denies skin rash or ulcers. Neurologic: Denies transient paralysis, weakness, paresthesias,  headache.  Psychiatric: Denies depression, anxiety, psychosis. Endocrine: Denies weight loss    Geriatric Syndromes: Constipation no  Incontinence no  Dizziness no   Syncope no   Skin problems yes  - hematoma/blister Visual Impairment no   Hearing impairment yes - R-ear hearing aid Eating impairment no  Impaired Memory or Cognition no   Behavioral problems no   Sleep problems no   Weight loss no    Pain:  Pain Location: left leg Pain Rating: She is currently in no pain at rest. Activity pain up to 7/10 Pain Duration: days Pain Therapies: Oxycodone 10 q 4 PRN Bowel Movement Difficulty: no  Dyspnea: Dyspnea Rating: 0  Mood: normal   Psychotropic Medication Use:  Sedative-hypnotics / Anxiolytics: No  Antipsychotics: No Antidepressants: No    PHYSICAL EXAM:. Wt Readings from Last 3 Encounters:  05/28/15 181 lb 9.6 oz (82.373 kg)   05/25/15 181 lb 10.5 oz (82.4 kg)  04/30/15 180 lb 3.2 oz (81.738 kg)   Temp Readings from Last 3 Encounters:  05/31/15 97.7 F (36.5 C) Oral  05/28/15 98 F (36.7 C) Oral  05/25/15 99 F (37.2 C) Oral   BP Readings from Last 3 Encounters:  05/31/15 120/74  05/28/15 145/68  05/25/15 113/91   Pulse Readings from Last 3 Encounters:  05/31/15 64  05/28/15 96  05/25/15 84    BP 120/74 mmHg  Pulse 64  Temp(Src) 97.7 F (36.5 C) (Oral)  Resp 20  Wt 182 lb 12.8 oz (82.918 kg)   General: alert, cooperative, well nourished, pleasant, clean, groomed HEENT:  NCAT, sclera clear, nares patent, R-hearing aid in place, oropharynx clear and moist mucus mems Neck:  Supple, No JVD, no LAD CV:  Irregularly irregular, 2/6 systolic murmur at left sternal border, mechanical S1 closure with MVR RESP: Clear to auscultation bilaterally. Normal effort. ABD:  Soft, Non-tender, non-distended, +bowel sounds, no masses MSK:  Left Lower Extremity edema and tenderness localized to tibial fracture site below knee, gradually improving, with knee immobilizer on, removed and replaced ACE wrap. Back without obvious deformity or tenderness. EXT: Warm and well perfused. L > R mild non-pitting edema secondary to above LLE fracture. No calf tenderness. Gait:  Not tested. NWB LLE. Skin: Left Lower Ext hematoma about < 5x5cm area of superficial thick blister over area, improving and reducing in size. No extending erythema, warmth, or drainage. Has foam dressing in place, removed and replaced. Neurologic:Cranial nerves normal;  Muscle Tone within normal limits; Motor Strength: weakness of Left Lower Ext due to pain, but intact distal muscle strength 5/5 Sensation: Intact to light touch; Cerebellar: no tremors noted Psych:  Orientation oriented to person, place, time, and general circumstances; Judgment Good, Insight Intact Memory recent and remote memory intact; Attention Normal;  Mood appropriate; Speech normal;  Language normal and hearing disability ; Thought Coherent    No flowsheet data found. No flowsheet data found.  MiniCog: Not tested MMSE or MoCa:  Not tested Years of Education: 12 +  Assessment and Plan:    Problem List Items Addressed This Visit    URINARY FREQUENCY    Controlled on Myrbetriq ER 25mg  daily      Tibial plateau fracture - Primary    Healing, non-operatively. Fracture 2/2 traumatic fall Followed by Ortho (Dr Eulah PontMurphy) Complicated by hematoma formation, 2/2 anticoagulation  Plan: 1. Pain control - Oxycodone 10mg  q 4 hr PRN 2. Continue knee immobilizer, NWB LLE 3. Continue acute rehab PT, using walker, ROM 4. Follow-up outpatient w/  Ortho within 2 weeks, pending clearance to weight bear      REFLUX ESOPHAGITIS    Continue Pantoprazole 40mg  daily      Person living in residential institution   Osteoporosis    Currently with L-tibial plateau fracture from fall Prior diagnosis, unable to locate DEXA for BMD on chart, no other known fractures or hip fracture FRAX Score inc risk fractures (>7% hip frx, >23% major osteoporotic) Recommend Calcium citrate 1200mg  daily and consider bisphosphonate therapy (would need to get outside chart to see if prior trials on one of these meds) - Continue Vitamin D3 2,000iu daily      HYPERTENSION, BENIGN SYSTEMIC    Controlled BP Complication CKD-III. Also with Chronic AFib requiring rate control.  Plan: 1. Continue Metoprolol 75mg  BID (1.5 tabs), Diltiazem ER 180mg  daily 2. Monitor      History of mitral valve replacement    Stable. MVR (mechanical) on chronic anticoagulation coumadin, goal INR 2.5 to 3.5 Followed by Cardiology      Long term (current) use of anticoagulants    Chronic anticoagulation coumadin, goal INR 2.5 to 3.5 for mechanical MV Recent lower INR with held coumadin doses, held on 5/2 x 1 day for acute fracture (switched to heparin), Coumadin resumed 5/3 through 5/5, then per orders appears to be  held from 5/5 through 5/7 (x 3 days upon admission to SNF, per chart reasoning with elevated INR to 3 and fracture/hematoma, but unclear as to why this was done), then restarted 5/8 at Coumadin 6mg  daily.  Plan: 1. Monitor INR - last check INR 1.6 on 5/9 2. Continue coumadin 6mg  daily (total 42 mg weekly, prior dose 7.5 Mon-Th, and 5mg  all other days = 40mg  weekly) 3. Order to re-check PT/INR on Friday 5/12      CKD (chronic kidney disease), stage III    Resolved AKI in hospital. Cr from 1.7 down to 1.2. Chronic problem, likely 2/2 HTN With chronic anemia Hgb 10-11 b/l Check BMET 5/10      Chronic diarrhea    Chronic problem, followed [facility] (Dr Dulce Sellar), has had several colonoscopy, last 2014 for diarrhea, unremarkable, including biopsies. Significant history with partial colectomy and poor water absorption, thought component of bacterial overgrowth. - Prior treatments Florastor, Zenpep (panc enzyme) - without improvement - Last seen 03/2015 - switched to Creon (48,000u TID w/ meals and up to 24,000 w/ snacks) notable improvement  Plan: 1. Continue home 48,000 units Creon TID with meals - (note patient's home rx is 24,000u capsules, and formulary only has 12,000u capsules, so takes 4 TID WC)      ATRIAL FIBRILLATION    Stable, rate controlled chronic AFib On chronic anticoagulation for MVR (mechanical) goal 2.5 to 3.5 Followed by Cardiology  Plan: 1. Continue Metoprolol 75mg  BID, Diltiazem ER 180mg  daily 2. Continue Digoxin 0.125mg  daily - last dig lvl 5/3 at 1.3 3. Continue Coumadin / INR      ANEMIA, IRON DEFICIENCY, UNSPEC.    Acute on chronic with recent fracture, hematoma formation. Likely component of CKD, no prior iron studies. Check CBC on 5/10, for Hgb trend. Hematoma is resolving.         Family communications: Discussed admission to SNF for Acute Rehab with daughter Janan Ridge, also daughter Milon Dikes will be primary contact  Patient is primary  responsible party - full capacity for medical decision making.  Advanced Directives (MOST form, Living Will, HCPOA): no Code Status:    FULL Intubation Status: yes  Intravenous Fluids:  yes Feeding Tubes: yes Antibiotics: yes Hospitalization: yes Emergency contact:  Milon Dikes [phone])   Follow Up: Within 1 week.  Saralyn Pilar, DO [facility], PGY-3

## 2015-05-31 ENCOUNTER — Encounter: Payer: Self-pay | Admitting: Family Medicine

## 2015-05-31 ENCOUNTER — Inpatient Hospital Stay: Payer: Medicare Other | Admitting: Family Medicine

## 2015-05-31 DIAGNOSIS — K529 Noninfective gastroenteritis and colitis, unspecified: Secondary | ICD-10-CM | POA: Insufficient documentation

## 2015-05-31 DIAGNOSIS — N183 Chronic kidney disease, stage 3 unspecified: Secondary | ICD-10-CM | POA: Insufficient documentation

## 2015-05-31 DIAGNOSIS — N189 Chronic kidney disease, unspecified: Secondary | ICD-10-CM | POA: Insufficient documentation

## 2015-05-31 NOTE — Assessment & Plan Note (Addendum)
Stable, rate controlled chronic AFib On chronic anticoagulation for MVR (mechanical) goal 2.5 to 3.5 Followed by Cardiology  Plan: 1. Continue Metoprolol 75mg  BID, Diltiazem ER 180mg  daily 2. Continue Digoxin 0.125mg  daily - last dig lvl 5/3 at 1.3 3. Continue Coumadin / INR

## 2015-05-31 NOTE — Assessment & Plan Note (Signed)
Chronic problem, [facility] (Dr Dulce [facility]), has had several colonoscopy, last 2014 for diarrhea, unremarkable, including biopsies. Significant history with partial colectomy and poor water absorption, thought component of bacterial overgrowth. - Prior treatments Florastor, Zenpep (panc enzyme) - without improvement - Last seen 03/2015 - switched to Creon (48,000u TID w/ meals and up to 24,000 w/ snacks) notable improvement  Plan: 1. Continue home 48,000 units Creon TID with meals - (note patient's home rx is 24,000u capsules, and formulary only has 12,000u capsules, so takes 4 TID WC)

## 2015-05-31 NOTE — Assessment & Plan Note (Addendum)
Currently with L-tibial plateau fracture from fall Prior diagnosis, unable to locate DEXA for BMD on chart, no other known fractures or hip fracture FRAX Score inc risk fractures (>7% hip frx, >23% major osteoporotic) Recommend Calcium citrate 1200mg  daily and consider bisphosphonate therapy (would need to get outside chart to see if prior trials on one of these meds) - Continue Vitamin D3 2,000iu daily

## 2015-05-31 NOTE — Assessment & Plan Note (Signed)
Chronic anticoagulation coumadin, goal INR 2.5 to 3.5 for mechanical MV Recent lower INR with held coumadin doses, held on 5/2 x 1 day for acute fracture (switched to heparin), Coumadin resumed 5/3 through 5/5, then per orders appears to be held from 5/5 through 5/7 (x 3 days upon admission to SNF, per chart reasoning with elevated INR to 3 and fracture/hematoma, but unclear as to why this was done), then restarted 5/8 at Coumadin 6mg  daily.  Plan: 1. Monitor INR - last check INR 1.6 on 5/9 2. Continue coumadin 6mg  daily (total 42 mg weekly, prior dose 7.5 Mon-Th, and 5mg  all other days = 40mg  weekly) 3. Order to re-check PT/INR on Friday 5/12

## 2015-05-31 NOTE — Assessment & Plan Note (Addendum)
Stable. MVR (mechanical) on chronic anticoagulation coumadin, goal INR 2.5 to 3.5 Followed by Cardiology

## 2015-05-31 NOTE — Assessment & Plan Note (Addendum)
Controlled BP Complication CKD-III. Also with Chronic AFib requiring rate control.  Plan: 1. Continue Metoprolol 75mg  BID (1.5 tabs), Diltiazem ER 180mg  daily 2. Monitor

## 2015-05-31 NOTE — Assessment & Plan Note (Signed)
Controlled on Myrbetriq ER 25mg  daily

## 2015-05-31 NOTE — Assessment & Plan Note (Signed)
Continue Pantoprazole 40 mg daily

## 2015-05-31 NOTE — Assessment & Plan Note (Addendum)
Resolved AKI in hospital. Cr from 1.7 down to 1.2. Chronic problem, likely 2/2 HTN With chronic anemia Hgb 10-11 b/l Check BMET 5/10

## 2015-05-31 NOTE — Assessment & Plan Note (Signed)
Acute on chronic with recent fracture, hematoma formation. Likely component of CKD, no prior iron studies. Check CBC on 5/10, for Hgb trend. Hematoma is resolving.

## 2015-05-31 NOTE — Assessment & Plan Note (Addendum)
Healing, non-operatively. Fracture 2/2 traumatic fall Followed by Ortho (Dr Eulah PontMurphy) Complicated by hematoma formation, 2/2 anticoagulation  Plan: 1. Pain control - Oxycodone 10mg  q 4 hr PRN 2. Continue knee immobilizer, NWB LLE 3. Continue acute rehab PT, using walker, ROM 4. Follow-up outpatient w/ Ortho within 2 weeks, pending clearance to weight bear

## 2015-06-01 ENCOUNTER — Non-Acute Institutional Stay (INDEPENDENT_AMBULATORY_CARE_PROVIDER_SITE_OTHER): Payer: Medicare Other | Admitting: Family Medicine

## 2015-06-01 ENCOUNTER — Observation Stay (HOSPITAL_COMMUNITY)
Admission: EM | Admit: 2015-06-01 | Discharge: 2015-06-04 | Disposition: A | Discharge status: Home Health | Payer: Medicare Other | Attending: Family Medicine | Admitting: Family Medicine

## 2015-06-01 ENCOUNTER — Emergency Department (HOSPITAL_COMMUNITY): Payer: Medicare Other

## 2015-06-01 ENCOUNTER — Encounter (HOSPITAL_COMMUNITY): Payer: Self-pay | Admitting: *Deleted

## 2015-06-01 DIAGNOSIS — R072 Precordial pain: Principal | ICD-10-CM | POA: Diagnosis present

## 2015-06-01 DIAGNOSIS — Z87891 Personal history of nicotine dependence: Secondary | ICD-10-CM | POA: Insufficient documentation

## 2015-06-01 DIAGNOSIS — I4510 Unspecified right bundle-branch block: Secondary | ICD-10-CM | POA: Insufficient documentation

## 2015-06-01 DIAGNOSIS — R079 Chest pain, unspecified: Secondary | ICD-10-CM | POA: Diagnosis not present

## 2015-06-01 DIAGNOSIS — K219 Gastro-esophageal reflux disease without esophagitis: Secondary | ICD-10-CM | POA: Diagnosis not present

## 2015-06-01 DIAGNOSIS — D509 Iron deficiency anemia, unspecified: Secondary | ICD-10-CM | POA: Insufficient documentation

## 2015-06-01 DIAGNOSIS — I129 Hypertensive chronic kidney disease with stage 1 through stage 4 chronic kidney disease, or unspecified chronic kidney disease: Secondary | ICD-10-CM | POA: Diagnosis not present

## 2015-06-01 DIAGNOSIS — Z952 Presence of prosthetic heart valve: Secondary | ICD-10-CM

## 2015-06-01 DIAGNOSIS — K5900 Constipation, unspecified: Secondary | ICD-10-CM | POA: Diagnosis not present

## 2015-06-01 DIAGNOSIS — Z954 Presence of other heart-valve replacement: Secondary | ICD-10-CM | POA: Diagnosis not present

## 2015-06-01 DIAGNOSIS — I482 Chronic atrial fibrillation, unspecified: Secondary | ICD-10-CM | POA: Insufficient documentation

## 2015-06-01 DIAGNOSIS — Z7901 Long term (current) use of anticoagulants: Secondary | ICD-10-CM

## 2015-06-01 DIAGNOSIS — S82142D Displaced bicondylar fracture of left tibia, subsequent encounter for closed fracture with routine healing: Secondary | ICD-10-CM | POA: Insufficient documentation

## 2015-06-01 DIAGNOSIS — D62 Acute posthemorrhagic anemia: Secondary | ICD-10-CM | POA: Diagnosis not present

## 2015-06-01 DIAGNOSIS — N183 Chronic kidney disease, stage 3 unspecified: Secondary | ICD-10-CM | POA: Diagnosis present

## 2015-06-01 DIAGNOSIS — R0789 Other chest pain: Secondary | ICD-10-CM

## 2015-06-01 DIAGNOSIS — I200 Unstable angina: Secondary | ICD-10-CM | POA: Diagnosis present

## 2015-06-01 DIAGNOSIS — Z79899 Other long term (current) drug therapy: Secondary | ICD-10-CM | POA: Insufficient documentation

## 2015-06-01 DIAGNOSIS — W19XXXD Unspecified fall, subsequent encounter: Secondary | ICD-10-CM | POA: Insufficient documentation

## 2015-06-01 DIAGNOSIS — D631 Anemia in chronic kidney disease: Secondary | ICD-10-CM | POA: Diagnosis not present

## 2015-06-01 DIAGNOSIS — I472 Ventricular tachycardia: Secondary | ICD-10-CM | POA: Insufficient documentation

## 2015-06-01 DIAGNOSIS — S82143A Displaced bicondylar fracture of unspecified tibia, initial encounter for closed fracture: Secondary | ICD-10-CM | POA: Diagnosis present

## 2015-06-01 DIAGNOSIS — N189 Chronic kidney disease, unspecified: Secondary | ICD-10-CM | POA: Diagnosis present

## 2015-06-01 HISTORY — DX: Rheumatic mitral valve disease, unspecified: I05.9

## 2015-06-01 LAB — FERRITIN: Ferritin: 76 ng/mL (ref 11–307)

## 2015-06-01 LAB — CBC WITH DIFFERENTIAL/PLATELET
Basophils Absolute: 0 10*3/uL (ref 0.0–0.1)
Basophils Relative: 0 %
Eosinophils Absolute: 0.1 10*3/uL (ref 0.0–0.7)
Eosinophils Relative: 1 %
HCT: 27 % — ABNORMAL LOW (ref 36.0–46.0)
Hemoglobin: 8.5 g/dL — ABNORMAL LOW (ref 12.0–15.0)
Lymphocytes Relative: 20 %
Lymphs Abs: 1.5 10*3/uL (ref 0.7–4.0)
MCH: 27.9 pg (ref 26.0–34.0)
MCHC: 31.5 g/dL (ref 30.0–36.0)
MCV: 88.5 fL (ref 78.0–100.0)
Monocytes Absolute: 0.7 10*3/uL (ref 0.1–1.0)
Monocytes Relative: 9 %
Neutro Abs: 5.1 10*3/uL (ref 1.7–7.7)
Neutrophils Relative %: 70 %
Platelets: 294 10*3/uL (ref 150–400)
RBC: 3.05 MIL/uL — ABNORMAL LOW (ref 3.87–5.11)
RDW: 15.3 % (ref 11.5–15.5)
WBC: 7.3 10*3/uL (ref 4.0–10.5)

## 2015-06-01 LAB — IRON AND TIBC
Iron: 37 ug/dL (ref 28–170)
Saturation Ratios: 12 % (ref 10.4–31.8)
TIBC: 319 ug/dL (ref 250–450)
UIBC: 282 ug/dL

## 2015-06-01 LAB — HEMOGLOBIN AND HEMATOCRIT, BLOOD
HCT: 25.5 % — ABNORMAL LOW (ref 36.0–46.0)
Hemoglobin: 7.8 g/dL — ABNORMAL LOW (ref 12.0–15.0)

## 2015-06-01 LAB — COMPREHENSIVE METABOLIC PANEL
ALT: 19 U/L (ref 14–54)
AST: 24 U/L (ref 15–41)
Albumin: 2.9 g/dL — ABNORMAL LOW (ref 3.5–5.0)
Alkaline Phosphatase: 46 U/L (ref 38–126)
Anion gap: 9 (ref 5–15)
BUN: 18 mg/dL (ref 6–20)
CO2: 23 mmol/L (ref 22–32)
Calcium: 9.3 mg/dL (ref 8.9–10.3)
Chloride: 108 mmol/L (ref 101–111)
Creatinine, Ser: 1.32 mg/dL — ABNORMAL HIGH (ref 0.44–1.00)
GFR calc Af Amer: 44 mL/min — ABNORMAL LOW (ref >60)
GFR calc non Af Amer: 38 mL/min — ABNORMAL LOW (ref >60)
Glucose, Bld: 109 mg/dL — ABNORMAL HIGH (ref 65–99)
Potassium: 3.7 mmol/L (ref 3.5–5.1)
Sodium: 140 mmol/L (ref 135–145)
Total Bilirubin: 1.7 mg/dL — ABNORMAL HIGH (ref 0.3–1.2)
Total Protein: 7 g/dL (ref 6.5–8.1)

## 2015-06-01 LAB — RETICULOCYTES
RBC.: 3.05 MIL/uL — ABNORMAL LOW (ref 3.87–5.11)
Retic Count, Absolute: 137.3 10*3/uL (ref 19.0–186.0)
Retic Ct Pct: 4.5 % — ABNORMAL HIGH (ref 0.4–3.1)

## 2015-06-01 LAB — D-DIMER, QUANTITATIVE: D-Dimer, Quant: 4.9 ug/mL-FEU — ABNORMAL HIGH (ref 0.00–0.50)

## 2015-06-01 LAB — I-STAT TROPONIN, ED: Troponin i, poc: 0.01 ng/mL (ref 0.00–0.08)

## 2015-06-01 LAB — FOLATE: Folate: 15.8 ng/mL (ref >5.9)

## 2015-06-01 LAB — PROTIME-INR
INR: 1.86 — ABNORMAL HIGH (ref 0.00–1.49)
Prothrombin Time: 21.3 seconds — ABNORMAL HIGH (ref 11.6–15.2)

## 2015-06-01 LAB — DIGOXIN LEVEL: Digoxin Level: 0.7 ng/mL — ABNORMAL LOW (ref 0.8–2.0)

## 2015-06-01 LAB — TROPONIN I: Troponin I: 0.03 ng/mL (ref <0.031)

## 2015-06-01 LAB — VITAMIN B12: Vitamin B-12: 374 pg/mL (ref 180–914)

## 2015-06-01 MED ORDER — ONDANSETRON HCL 4 MG/2ML IJ SOLN: 4.0000 mg | Freq: Four times a day (QID) | INTRAMUSCULAR | Status: DC | PRN

## 2015-06-01 MED ORDER — ASPIRIN EC 325 MG PO TBEC
325.0000 mg | DELAYED_RELEASE_TABLET | Freq: Every day | ORAL | Status: DC
  Administered 2015-06-02 – 2015-06-04 (×3): 325 mg via ORAL
  Filled 2015-06-01 (×5): qty 1

## 2015-06-01 MED ORDER — POLYETHYLENE GLYCOL 3350 17 G PO PACK
17.0000 g | PACK | Freq: Two times a day (BID) | ORAL | Status: DC
  Administered 2015-06-01 – 2015-06-02 (×2): 17 g via ORAL
  Filled 2015-06-01 (×3): qty 1

## 2015-06-01 MED ORDER — PANTOPRAZOLE SODIUM 40 MG PO TBEC
40.0000 mg | DELAYED_RELEASE_TABLET | Freq: Every day | ORAL | Status: DC
  Administered 2015-06-01 – 2015-06-04 (×4): 40 mg via ORAL
  Filled 2015-06-01 (×5): qty 1

## 2015-06-01 MED ORDER — DILTIAZEM HCL ER COATED BEADS 180 MG PO CP24
180.0000 mg | ORAL_CAPSULE | Freq: Every day | ORAL | Status: DC
Start: 2015-06-02 — End: 2015-06-01

## 2015-06-01 MED ORDER — NITROGLYCERIN 0.4 MG SL SUBL: 0.4000 mg | SUBLINGUAL_TABLET | SUBLINGUAL | Status: DC | PRN

## 2015-06-01 MED ORDER — METOPROLOL TARTRATE 50 MG PO TABS
75.0000 mg | ORAL_TABLET | Freq: Two times a day (BID) | ORAL | Status: DC
  Administered 2015-06-01 – 2015-06-04 (×6): 75 mg via ORAL
  Filled 2015-06-01 (×6): qty 1

## 2015-06-01 MED ORDER — DILTIAZEM HCL ER COATED BEADS 180 MG PO CP24
180.0000 mg | ORAL_CAPSULE | Freq: Every day | ORAL | Status: DC
  Administered 2015-06-01 – 2015-06-04 (×4): 180 mg via ORAL
  Filled 2015-06-01 (×5): qty 1

## 2015-06-01 MED ORDER — ACETAMINOPHEN 325 MG PO TABS: 650.0000 mg | ORAL_TABLET | ORAL | Status: DC | PRN

## 2015-06-01 MED ORDER — VITAMIN D 1000 UNITS PO TABS
2000.0000 [IU] | ORAL_TABLET | Freq: Every day | ORAL | Status: DC
  Administered 2015-06-02 – 2015-06-04 (×3): 2000 [IU] via ORAL
  Filled 2015-06-01 (×4): qty 2

## 2015-06-01 MED ORDER — DIGOXIN 125 MCG PO TABS
125.0000 ug | ORAL_TABLET | Freq: Every day | ORAL | Status: DC
  Administered 2015-06-01 – 2015-06-04 (×4): 125 ug via ORAL
  Filled 2015-06-01 (×5): qty 1

## 2015-06-01 MED ORDER — LATANOPROST 0.005 % OP SOLN
1.0000 [drp] | Freq: Every day | OPHTHALMIC | Status: DC
  Administered 2015-06-01 – 2015-06-03 (×3): 1 [drp] via OPHTHALMIC
  Filled 2015-06-01: qty 2.5

## 2015-06-01 MED ORDER — MIRABEGRON ER 25 MG PO TB24
25.0000 mg | ORAL_TABLET | Freq: Every day | ORAL | Status: DC
  Administered 2015-06-02 – 2015-06-04 (×3): 25 mg via ORAL
  Filled 2015-06-01 (×3): qty 1

## 2015-06-01 MED ORDER — MORPHINE SULFATE (PF) 2 MG/ML IV SOLN: 2.0000 mg | INTRAVENOUS | Status: DC | PRN

## 2015-06-01 MED ORDER — CLONIDINE HCL 0.2 MG PO TABS
0.2000 mg | ORAL_TABLET | Freq: Once | ORAL | Status: AC
  Administered 2015-06-02: 0.2 mg via ORAL
  Filled 2015-06-01: qty 1

## 2015-06-01 MED ORDER — GI COCKTAIL ~~LOC~~: 30.0000 mL | Freq: Four times a day (QID) | ORAL | Status: DC | PRN

## 2015-06-01 MED ORDER — HEPARIN (PORCINE) IN NACL 100-0.45 UNIT/ML-% IJ SOLN
1200.0000 [IU]/h | INTRAMUSCULAR | Status: DC
  Administered 2015-06-01: 1200 [IU]/h via INTRAVENOUS
  Filled 2015-06-01 (×2): qty 250

## 2015-06-01 MED ORDER — ALUM & MAG HYDROXIDE-SIMETH 200-200-20 MG/5ML PO SUSP
15.0000 mL | Freq: Once | ORAL | Status: AC
  Administered 2015-06-01: 15 mL via ORAL
  Filled 2015-06-01: qty 30

## 2015-06-01 MED ORDER — PANCRELIPASE (LIP-PROT-AMYL) 12000-38000 UNITS PO CPEP
48000.0000 [IU] | ORAL_CAPSULE | Freq: Three times a day (TID) | ORAL | Status: DC
  Administered 2015-06-02 – 2015-06-04 (×6): 48000 [IU] via ORAL
  Filled 2015-06-01 (×5): qty 4

## 2015-06-01 NOTE — ED Provider Notes (Signed)
CSN: [id number]     Arrival date & time 06/01/15  1512 History   None    Chief Complaint  Patient presents with  . Chest Pain     (Consider location/radiation/quality/duration/timing/severity/associated sxs/prior Treatment) Patient is a 80 y.o. female presenting with general illness. The history is provided by the patient and medical records.  Illness Location:  Chest pain Severity:  Moderate Onset quality:  Sudden Duration:  1 hour Timing:  Constant Progression:  Improving Chronicity:  New Context:  Patient with a history of A. fib, hypertension, hyperlipidemia. Onset of substernal chest pain after eating, squeezing, nonradiating, nonexertional. Endorses nausea, no emesis. Endorses diaphoresis. No fevers and chills. No infectious symptoms. Associated symptoms: chest pain and nausea   Associated symptoms: no abdominal pain, no cough, no diarrhea, no fever, no headaches, no shortness of breath and no vomiting     Past Medical History  Diagnosis Date  . Hypertension   . GI bleed   . Chronic atrial fibrillation (HCC)   . Dilated cardiomyopathy (HCC)     history of this, now resolved  . GERD (gastroesophageal reflux disease)   . Dysphagia     Hx  . Nonsustained ventricular tachycardia (HCC)   . Osteoporosis   . Microcytic anemia   . Arrhythmia     chronic afib NSVT  . RBBB (right bundle branch block)   . Heart murmur   . Asthma     years ago  . History of blood transfusion   . Protein in urine   . Hemorrhoid 04/2014    bleeding  . Fracture of tibial plateau 05/21/2015   Past Surgical History  Procedure Laterality Date  . Cholecystectomy    . Mitral valve prosthesis with angioplasty  1987  . Tubal ligation    . Transthoracic echocardiogram  02/2008, 03/2006, 06/2004  . Polyp removal    . Colonoscopy    . Trabeculectomy Right 05/17/2014    Procedure: TRABECULECTOMY WITH MITOMYCIN RIGHT EYE;  Surgeon: Chalmers Guest, MD;  Location: [facility] OR;  Service: Ophthalmology;   Laterality: Right;  . Mitomycin c application Right 05/17/2014    Procedure: MITOMYCIN C APPLICATION;  Surgeon: Chalmers Guest, MD;  Location: [facility] OR;  Service: Ophthalmology;  Laterality: Right;   Family History  Problem Relation Age of Onset  . Hypertension Mother   . Colon cancer Neg Hx   . Cancer Sister     in the Bladder   Social History  Substance Use Topics  . Smoking status: Former Games developer  . Smokeless tobacco: Never Used     Comment: quiit 1987  . Alcohol Use: No   OB History    No data available     Review of Systems  Constitutional: Positive for diaphoresis. Negative for fever and chills.  Respiratory: Negative for cough and shortness of breath.   Cardiovascular: Positive for chest pain. Negative for leg swelling.  Gastrointestinal: Positive for nausea. Negative for vomiting, abdominal pain and diarrhea.  Musculoskeletal: Negative for back pain.  Neurological: Negative for light-headedness and headaches.  All other systems reviewed and are negative.     Allergies  Esomeprazole magnesium  Home Medications   Prior to Admission medications   Medication Sig Start Date End Date Taking? Authorizing Provider  Brinzolamide-Brimonidine 1-0.2 % SUSP Place 1 drop into the left eye 3 (three) times daily.    Yes Historical Provider, MD  CARTIA XT 180 MG 24 hr capsule TAKE 1 CAPSULE BY MOUTH DAILY 05/15/15  Yes Marinus Maw,  MD  Cholecalciferol (VITAMIN D) 2000 UNITS tablet Take 2,000 Units by mouth daily.   Yes Historical Provider, MD  DIGOX 125 MCG tablet TAKE 1 TABLET BY MOUTH EVERY DAY 03/07/15  Yes Marinus Maw, MD  lipase/protease/amylase (CREON) 12000 units CPEP capsule Take 48,000 Units by mouth 3 (three) times daily before meals.   Yes Historical Provider, MD  metoprolol (LOPRESSOR) 50 MG tablet TAKE 1 1/2 TABLETS BY MOUTH TWICE DAILY Patient taking differently: TAKE 1.5 TABLETS (75mg ) BY MOUTH TWICE DAILY 03/14/15  Yes Marinus Maw, MD  mirabegron ER (MYRBETRIQ)  25 MG TB24 tablet Take 25 mg by mouth daily.   Yes Historical Provider, MD  oxyCODONE 10 MG TABS Take 1 tablet (10 mg total) by mouth every 4 (four) hours while awake. 05/25/15  Yes Hillery Hunter Melancon, MD  pantoprazole (PROTONIX) 40 MG tablet Take 40 mg by mouth daily.  08/10/12  Yes Historical Provider, MD  polyethylene glycol (MIRALAX / GLYCOLAX) packet Take 17 g by mouth 2 (two) times daily. 05/25/15  Yes Caleb G Melancon, MD  TRAVATAN Z 0.004 % SOLN ophthalmic solution Place 1 drop into both eyes at bedtime.  04/18/14  Yes Historical Provider, MD  warfarin (COUMADIN) 6 MG tablet Take 6 mg by mouth daily.   Yes Historical Provider, MD   Temp(Src) 98.4 F (36.9 C)  SpO2 100% Physical Exam  Constitutional: She is oriented to person, place, and time. She appears well-developed and well-nourished. No distress.  HENT:  Head: Normocephalic and atraumatic.  Eyes: EOM are normal. Pupils are equal, round, and reactive to light.  Neck: Normal range of motion.  Cardiovascular: Normal rate and regular rhythm.   Pulmonary/Chest: No tachypnea. No respiratory distress. She has no wheezes. She has no rhonchi.  Abdominal: Soft. Normal appearance. There is no tenderness. There is no rebound, no guarding, no CVA tenderness, no tenderness at McBurney's point and negative Murphy's sign.  Musculoskeletal:       Right lower leg: She exhibits no swelling and no edema.  Left lower extremity with immobilizer in place.  Neurological: She is alert and oriented to person, place, and time. GCS eye subscore is 4. GCS verbal subscore is 5. GCS motor subscore is 6.  Skin: Skin is warm and dry.    ED Course  Procedures (including critical care time) Labs Review Labs Reviewed  CBC WITH DIFFERENTIAL/PLATELET - Abnormal; Notable for the following:    RBC 3.05 (*)    Hemoglobin 8.5 (*)    HCT 27.0 (*)    All other components within normal limits  COMPREHENSIVE METABOLIC PANEL - Abnormal; Notable for the following:     Glucose, Bld 109 (*)    Creatinine, Ser 1.32 (*)    Albumin 2.9 (*)    Total Bilirubin 1.7 (*)    GFR calc non Af Amer 38 (*)    GFR calc Af Amer 44 (*)    All other components within normal limits  PROTIME-INR - Abnormal; Notable for the following:    Prothrombin Time 21.3 (*)    INR 1.86 (*)    All other components within normal limits  DIGOXIN LEVEL  I-STAT TROPOININ, ED    Imaging Review Dg Chest 2 View  06/01/2015  CLINICAL DATA:  Centralized chest pain for 2 hours. EXAM: CHEST  2 VIEW COMPARISON:  04/18/2015 FINDINGS: Stable enlargement of the cardiac silhouette. There are median sternotomy wires. No evidence for pulmonary edema or focal airspace disease. The trachea is midline. Negative  for pneumothorax. Multilevel degenerative changes in the thoracic spine without large pleural effusions. IMPRESSION: Stable cardiomegaly without pulmonary edema. No acute chest findings. Electronically Signed   By: Richarda OverlieAdam  Henn M.D.   On: 06/01/2015 16:00   I have personally reviewed and evaluated these images and lab results as part of my medical decision-making.   EKG Interpretation   Date/Time:  Friday Jun 01 2015 15:22:03 EDT Ventricular Rate:  70 PR Interval:    QRS Duration: 193 QT Interval:  442 QTC Calculation: 477 R Axis:   -57 Text Interpretation:  Atrial fibrillation Ventricular premature complex  RBBB and LAFB ST depr, consider ischemia, inferior leads st depression  seen on prior ecgs.  Otherwise no significant change Confirmed by FLOYD  MD, DANIEL [phone]([id number]) on 06/01/2015 3:40:42 PM      MDM   Final diagnoses:  Chronic atrial fibrillation (HCC)  Precordial chest pain    80 year old female with a HEAR score of 6 presenting with chest pain. EKG shows obvious right bundle-branch block with concern for worsening ST depressions in her anterior leads compared to her prior EKG. Patient received full dose aspirin with EMS. Getting labs now. Chest x-ray pending. Hear score of 6.  We'll need to talk to cardiology regarding this patient.  First troponin negative. No leukocytosis. Patient does have anemia to 8.5; 2 g drop from prior lab work. Patient did recently have surgery. Likely it's from this. Denies any hematochezia or melena. Electrolytes within normal limits. Creatinine consistent with baseline. Digoxin level low. Chest x-ray with out evidence of pneumothorax or pneumonia. Contacted cardiology. Pending the recommendations.  Per cardiology, do not feel that this is ACS. Given the patient's hear score of 6, will admit the patient for stress test. Will likely require pharmacological stress test given recent mobility issues from leg injury.  Patient admitted in stable condition.  Lindalou HoseSean O'Rourke, MD 06/01/15 2359  Melene Planan Floyd, DO 06/02/15 0004

## 2015-06-01 NOTE — Progress Notes (Signed)
FMTS Attending Note  I personally saw and evaluated the patient. The plan of care was discussed with the resident team. I agree with the assessment and plan as documented by the resident.   During my evaluation the patient reported substernal chest pain. She was transferred to the ED for further evaluation. (See separate progress/NH note dated today 5/12).  Additionally: 1. Tibial Plateau Fracture - pain controlled, nursing staff reports occasional confusion (also noted on my exam today). Will decrease Oxycodone dose to 10 mg Q 8 hours (currently every 4 hours).  2. Subtherapeutic INR (goal 2.5-3.5 given MV replacement) - INR pending today, continue 6 mg Warfarin daily, adjust based on INR results.   Donnella ShamKyle Madell Heino MD

## 2015-06-01 NOTE — ED Notes (Signed)
Attempted report 

## 2015-06-01 NOTE — ED Notes (Signed)
Took pt off bedpan. About output.

## 2015-06-01 NOTE — Progress Notes (Signed)
ANTICOAGULATION CONSULT NOTE - Initial Consult  Pharmacy Consult for Heparin  Indication: Mitral valve replacement   Allergies  Allergen Reactions  . Esomeprazole Magnesium     REACTION: stomach upset, nausea    Patient Measurements:   Heparin Dosing Weight: 78 kg   Vital Signs: Temp: 98.4 F (36.9 C) (05/12 1529) BP: 170/86 mmHg (05/12 1845) Pulse Rate: 95 (05/12 1845)  Labs:  Recent Labs  06/01/15 1540  HGB 8.5*  HCT 27.0*  PLT 294  LABPROT 21.3*  INR 1.86*  CREATININE 1.32*    Estimated Creatinine Clearance: 39.5 mL/min (by C-G formula based on Cr of 1.32).   Medical History: Past Medical History  Diagnosis Date  . Hypertension   . GI bleed   . Chronic atrial fibrillation (HCC)   . Dilated cardiomyopathy (HCC)     history of this, now resolved  . GERD (gastroesophageal reflux disease)   . Dysphagia     Hx  . Nonsustained ventricular tachycardia (HCC)   . Osteoporosis   . Microcytic anemia   . RBBB (right bundle branch block)   . Asthma     years ago  . History of blood transfusion   . Protein in urine   . Hemorrhoid 04/2014    bleeding  . Fracture of tibial plateau 05/21/2015  . Rheumatic mitral valve disease     Medications:   (Not in a hospital admission)  Assessment: [age] YOF with a history of St Jude mechanical MVR in 1987 on chronic Coumadin at home. INR on admission is sub-therapeutic at 1.86. Cardiology is concerned for possible PE. H/H down to 8.5/27. This is an acute change since earlier in the month. Plt wnl.   Goal of Therapy:  Heparin level 0.3-0.7 units/ml Monitor platelets by anticoagulation protocol: Yes   Plan:  -Heparin 1200 units/hr IV. No bolus -F/u 8 hr HL -Monitor daily HL, CBC and s/s of bleeding   Vinnie LevelBenjamin Jebadiah Imperato, PharmD., BCPS Clinical Pharmacist Pager [phone]

## 2015-06-01 NOTE — Progress Notes (Signed)
80 y/o female with PMH Chronic Afib, MV Replacement, Chronic Anticoagulation, Recent admission for Tibial Plateau fracture of left knee, GERD, HTN, and CKD 3 admitted [facility].   Patient was being seen for initial admission evaluation (see attending addendum to resident note) when she noted 2 hours of substernal squeezing chest pressure. No radiation, no cough, no sob. She also admits to some anxiety related to multiple medical issues. Currently in left knee immobilizer for tibial plateau fracture.  Cardiac: irregular irregular rhythm, S1 and S2 present, 3/6 systolic murmur Resp: CTAB, normal work of breathing.  Abd: soft, mild periumbilical tenderness, no epigastric tenderness, normal bowel sounds Left LE: in immobilizer, removed immobilizer - patient has large anterior upper leg hematoma, 1+ edema present  Unable to get stat EKG or labs in nursing home.  80 y/o female recently admitted to NH now with acute onset of chest pain. -immediate transfer to ED for ACS/PE rule out.  Donnella ShamKyle Savien Mamula MD Assistant Professor  [facility]

## 2015-06-01 NOTE — Consult Note (Signed)
CARDIOLOGY CONSULT NOTE   Patient ID: [name] MRN: [id number] DOB/AGE: 24-Jun-1935 80 y.o.  Admit date: 06/01/2015  Primary Physician   Denny Levy, MD Primary Cardiologist   Dr [name] Reason for Consultation   CP, abnl ECG  NWG:[facility] is a 80 y.o. year old female with a history of St Jude mech MVR 1987, chronic coumadin, chronic afib, HTN, GERD, NSVT, anemia, RBBB  D/c to [facility] for rehab on 05/05 after tibial plateau fx. Pt had been participating in PT since in there, denies CP or SOB with activity. She is frustrated with the care at times because staff is slow to respond to bells and has trouble drawing her blood.   Today, states she was eating lunch and had onset of chest pain squeezing, tightness. Mid-chest, 8-9/10. Pt denies SOB, N&V or diaphoresis. She was given ASA 81 mg x 4, chewed them and felt like they helped. Symptoms resolved in about 2 hours, no other meds given. Pt had been belching and passing gas, feels like these symptoms stopped after the ASA. They resolved about the same time the chest pain did.    She had pain similar to this in the past and was put on Protonix, which helped. She thought that pain was from a heart attack, but it was her stomach. She is still on the Protonix. When she needs to belch, she drinks Ginger ale. She cannot lie down soon after eating, her meal will not digest.   She has not had pain exactly like this before. She has not had chest pain with any of the PT activities. She denies any DOE. She does not remember any problem with LE edema. She has not been weighing daily.   Past Medical History  Diagnosis Date  . Hypertension   . GI bleed   . Chronic atrial fibrillation (HCC)   . Dilated cardiomyopathy (HCC)     history of this, now resolved  . GERD (gastroesophageal reflux disease)   . Dysphagia     Hx  . Nonsustained ventricular tachycardia (HCC)   . Osteoporosis   . Microcytic anemia   . RBBB (right bundle branch  block)   . Asthma     years ago  . History of blood transfusion   . Protein in urine   . Hemorrhoid 04/2014    bleeding  . Fracture of tibial plateau 05/21/2015  . Rheumatic mitral valve disease      Past Surgical History  Procedure Laterality Date  . Cholecystectomy    . Mitral valve replacement  1987    St Jude mechanical valve  . Tubal ligation    . US echocardiography  02/2008, 03/2006, 06/2004  . Colonoscopy w/ polypectomy    . Colonoscopy    . Trabeculectomy Right 05/17/2014    Procedure: TRABECULECTOMY WITH MITOMYCIN RIGHT EYE;  Surgeon: Chalmers Guest, MD;  Location: [facility] OR;  Service: Ophthalmology;  Laterality: Right;  . Mitomycin c application Right 05/17/2014    Procedure: MITOMYCIN C APPLICATION;  Surgeon: Chalmers Guest, MD;  Location: [facility] OR;  Service: Ophthalmology;  Laterality: Right;    Allergies  Allergen Reactions  . Esomeprazole Magnesium     REACTION: stomach upset, nausea    I have reviewed the patient's current medications     nitroGLYCERIN  Prior to Admission medications   Medication Sig Start Date End Date Taking? Authorizing Provider  Brinzolamide-Brimonidine 1-0.2 % SUSP Place 1 drop into the left eye 3 (three)  times daily.    Yes Historical Provider, MD  CARTIA XT 180 MG 24 hr capsule TAKE 1 CAPSULE BY MOUTH DAILY 05/15/15  Yes Marinus MawGregg W Taylor, MD  Cholecalciferol (VITAMIN D) 2000 UNITS tablet Take 2,000 Units by mouth daily.   Yes Historical Provider, MD  DIGOX 125 MCG tablet TAKE 1 TABLET BY MOUTH EVERY DAY 03/07/15  Yes Marinus MawGregg W Taylor, MD  lipase/protease/amylase (CREON) 12000 units CPEP capsule Take 48,000 Units by mouth 3 (three) times daily before meals.   Yes Historical Provider, MD  metoprolol (LOPRESSOR) 50 MG tablet TAKE 1 1/2 TABLETS BY MOUTH TWICE DAILY Patient taking differently: TAKE 1.5 TABLETS (75mg ) BY MOUTH TWICE DAILY 03/14/15  Yes Marinus MawGregg W Taylor, MD  mirabegron ER (MYRBETRIQ) 25 MG TB24 tablet Take 25 mg by mouth daily.   Yes Historical  Provider, MD  oxyCODONE 10 MG TABS Take 1 tablet (10 mg total) by mouth every 4 (four) hours while awake. 05/25/15  Yes Hillery Hunteraleb G Melancon, MD  pantoprazole (PROTONIX) 40 MG tablet Take 40 mg by mouth daily.  08/10/12  Yes Historical Provider, MD  polyethylene glycol (MIRALAX / GLYCOLAX) packet Take 17 g by mouth 2 (two) times daily. 05/25/15  Yes Caleb G Melancon, MD  TRAVATAN Z 0.004 % SOLN ophthalmic solution Place 1 drop into both eyes at bedtime.  04/18/14  Yes Historical Provider, MD  warfarin (COUMADIN) 6 MG tablet Take 6 mg by mouth daily.   Yes Historical Provider, MD     Social History   Social History  . Marital Status: Divorced    Spouse Name: N/A  . Number of Children: N/A  . Years of Education: N/A   Occupational History  . Retired    Social History Main Topics  . Smoking status: Former Games developermoker  . Smokeless tobacco: Never Used     Comment: quiit 1987  . Alcohol Use: No  . Drug Use: No  . Sexual Activity: Not on file   Other Topics Concern  . Not on file   Social History Narrative   Was living alone until she fell and broke her leg.    Family Status  Relation Status Death Age  . Father Deceased     prostate cancer  . Mother Deceased    Family History  Problem Relation Age of Onset  . Hypertension Mother   . Colon cancer Neg Hx   . Cancer Sister     in the Bladder     ROS:  Full 14 point review of systems complete and found to be negative unless listed above.  Physical Exam: Blood pressure 135/62, pulse 82, temperature 98.4 F (36.9 C), resp. rate 22, SpO2 99 %.  General: Well developed, well nourished, female in no acute distress Head: Eyes PERRLA, No xanthomas.   Normocephalic and atraumatic, oropharynx without edema or exudate. Dentition: poor Lungs: decreased BS bases  Heart: Heart irregular rate and rhythm with S1, S2, crisp valve click, 2/6 murmur. pulses are 2+ both upper and R lower extrem.  LLE is wrapped and not disturbed. Cap refill is mildly  delayed Neck: No carotid bruits. No lymphadenopathy.  JVD not elevated. Abdomen: Bowel sounds present, abdomen soft and non-tender without masses or hernias noted. Msk:  No spine or cva tenderness. No weakness, no joint deformities or effusions. Extremities: No clubbing or cyanosis. 1+ bilat LE edema.  Neuro: Alert and oriented X 3. No focal deficits noted. Psych:  Good affect, responds appropriately Skin: No rashes or lesions noted.  Labs:   Lab Results  Component Value Date   WBC 7.3 06/01/2015   HGB 8.5* 06/01/2015   HCT 27.0* 06/01/2015   MCV 88.5 06/01/2015   PLT 294 06/01/2015    Recent Labs  06/01/15 1540  INR 1.86*     Recent Labs Lab 06/01/15 1540  NA 140  K 3.7  CL 108  CO2 23  BUN 18  CREATININE 1.32*  CALCIUM 9.3  PROT 7.0  BILITOT 1.7*  ALKPHOS 46  ALT 19  AST 24  GLUCOSE 109*  ALBUMIN 2.9*    Recent Labs  06/01/15 1544  TROPIPOC 0.01   Echo: 03/01/2008 SUMMARY - Overall left ventricular systolic function was normal. Left    ventricular ejection fraction was estimated to be 55 %.    Although no diagnostic left ventricular regional wall motion    abnormality was identified, this possibility cannot be    completely excluded on the basis of this study. - The aortic valve was mildly calcified. - There was mild fibrocalcific change of the aortic root. - There was a Librarian, academic prosthesis. There was    mild to moderate mitral annular calcification. There was mild    mitral valvular regurgitation. Mean transmitral gradient was    6 mmHg. Mitral valve area by pressure half-time was 2.56 cm^2. - The left atrium was moderately dilated. The interatrial septum    bows from left to right, consistent with increased left    atrial pressure. - Right ventricular size was normal. Apparent calcified    trabeculation/moderator band noted. Right ventricular    systolic function was  mildly reduced. - The estimated peak pulmonary artery systolic pressure was mildly    increased. - The right atrium was moderately dilated. - The inferior vena cava was dilated. Respirophasic inferior vena    cava changes were blunted (less than 50% variation).  ECG:  06/01/2015 Atrial fib, rate 70 RBBB is old, no sig morphology changes from 04/18/2015   Radiology:  Dg Chest 2 View 06/01/2015  CLINICAL DATA:  Centralized chest pain for 2 hours. EXAM: CHEST  2 VIEW COMPARISON:  04/18/2015 FINDINGS: Stable enlargement of the cardiac silhouette. There are median sternotomy wires. No evidence for pulmonary edema or focal airspace disease. The trachea is midline. Negative for pneumothorax. Multilevel degenerative changes in the thoracic spine without large pleural effusions. IMPRESSION: Stable cardiomegaly without pulmonary edema. No acute chest findings. Electronically Signed   By: Richarda Overlie M.D.   On: 06/01/2015 16:00    ASSESSMENT AND PLAN:   The patient was seen today by Dr Elease Hashimoto, the patient evaluated and the data reviewed.  Principal Problem:   Chest pain with moderate risk for cardiac etiology - recheck troponin now and continue to cycle - ck echo as well - if ez remain negative, MV in am - if ez are elevated, will need cath  Active Problems:   History of mitral valve replacement - St Jude mechanical valve - crisp valve click, ck echo    Long term (current) use of anticoagulants - INR range 2.5-3.5 - subtherapeutic - would add heparin if okay with her IM (new anemia)    Tibial plateau fracture - per IM    CKD (chronic kidney disease), stage III - per IM    Anemia due to blood loss, acute - feel anemia is likely 2nd blood loss as H&H trending down steadily - per IM   SignedTheodore Demark, PA-C 06/01/2015 6:03 PM Beeper [phone]  Co-Sign MD  Attending Note:   The patient was seen and examined.  Agree with assessment and plan as noted above.   Changes made to the above note as needed.  Pt is a difficult historian Has some CP - very vague Will cycle troponins and schedule a myoview  Her recent hx is concerning for possible pulmonary embolus - leg injury, subtheraputic INR. If cardiac work up is negative, I would suggest further work up for PE.  Her anemia is also very concerning - will need further eval.   Alvia Grove., MD, [facility] 06/01/2015, 6:57 PM [address],  [address] Office [phone] Pager [phone]

## 2015-06-01 NOTE — H&P (Signed)
Family Medicine Teaching [facility] Admission History and Physical Service Pager: [phone]  [name] Medical record number: [id number] Date of birth: 03-08-1935 Age: 80 y.o. Gender: female  Primary Care Provider: Denny Levy, MD Consultants: Cardiology Code Status: full  Chief Complaint: Chest pain  Assessment and Plan: KATHIA COVINGTON is a 80 y.o. female presenting with Chest Pain . PMH is significant for chronic afib, HTN,GERD, Anemia, Mitral Valve Replaced 1987, Chronic Anticoagulation on Coumadin, Recent Tibial Plateau Fracture, RBBB, CKD IIIA.  # Chest Pain Rule Out ACS : Chronic Afib, s/p Mitral Valve replacement, Typical / Atypical symptoms. HEART score 5-6, EKG with ST depression precordial leads though mostly unchanged from previous. Initial Trop negative. Chest pain improved with ASA. No active chest pain at this time. VSS.  - Admit to Tele Hensel Attending - Lipid panel, TSH, A1C - Trend troponins - Cards consulted in the ED recommend Echo / Myoview in the am.  - Per cards cath if trops trending up.  - Morphine prn for chest pain - Nitro - on Metoprolol - Started on Heparin. Rechk H/H at MN.  - NPO after MN.  - Agree with Cards D-Dimer for initial PE workup. - get dig level.   # Afib - Chronic and rate controlled, subtherapeutic INR - she says there has been some confusion about her dose at the SNF.  - Continue Metoprolol, Dilt - Cont Dig - Check Dig level.  - heparin per pharmacy tonight. Back to coumadin when able.   # HTN - BP elevated here.  - Continue dilt, metoprolol,  - Consider prn labetalol if HR tolerates.   # Anemia - Unclear cause. Normocytic, normochromic. Hematoma on Left leg is stable and smaller than at D/C last hospitalization. May be bleeding, but hx negative.  - Anemia panel.  - FOBT - Further workup as indicated.  - Txf if <8  # Tibial Plateau Fracture - pain is better, not requiring narcotics. Less Tender to palpation,  continues in knee immobilizer.  - Cont knee immobilizer.  - Non weight bearing.  - Ice as needed.   # GERD  - protonix BID    FEN/GI: NPO after MN Prophylaxis: Anticoagulated on Coumadin  Disposition: Observation.   History of Present Illness:  [name] is a 80 y.o. female presenting with chest pain that started after she had finished eating lunch today around 1-130 pm. She was given two ASA and this made her symptoms better. She admits to diaphoresis, No SOB, Arm pain, Neck pain, or jaw pain. Chest pain was in the center of her chest, was a "squeezing" pain, she also says it was sharp, nonradiating, did not feel any abnormal heart rhythm / palpitations. Not worse with palpation, she says it was worse with deep inspiration. No nausea. Former smoker stopped at 50, No ETOH. No family history of early heart disease. She says her leg feels better, She denies LE swelling, pain or redness. No current SOB. Having current symptoms of "tiredness". Additionally, she does not like [facility] and does not want to go back there.   In the ED, EKG with inferior lead depression. Trop was negative. INR subtherapeutic, CXR with cardiomegaly,   Review Of Systems: Per HPI with the following additions: none Otherwise the remainder of the systems were negative.  Patient Active Problem List   Diagnosis Date Noted  . Chest pain with moderate risk for cardiac etiology 06/01/2015  . Anemia due to blood loss, acute 06/01/2015  .  Substernal chest pain 06/01/2015  . Chronic atrial fibrillation (HCC)   . CKD (chronic kidney disease), stage III 05/31/2015  . Chronic diarrhea 05/31/2015  . Person living in residential institution 05/30/2015  . Tibial plateau fracture 05/22/2015  . Healthcare maintenance 04/30/2015  . Encounter for therapeutic drug monitoring 02/15/2013  . Long term (current) use of anticoagulants 05/03/2010  . URINARY FREQUENCY 05/24/2008  . RBBB 02/16/2008  . VENTRICULAR TACHYCARDIA  02/16/2008  . History of mitral valve replacement - St Jude mechanical valve 02/16/2008  . ANEMIA, IRON DEFICIENCY, UNSPEC. 03/19/2006  . HYPERTENSION, BENIGN SYSTEMIC 03/19/2006  . ATRIAL FIBRILLATION 03/19/2006  . RHINITIS, ALLERGIC 03/19/2006  . REFLUX ESOPHAGITIS 03/19/2006  . OSTEOARTHRITIS, LOWER LEG 03/19/2006  . Osteoporosis 03/19/2006    Past Medical History: Past Medical History  Diagnosis Date  . Hypertension   . GI bleed   . Chronic atrial fibrillation (HCC)   . Dilated cardiomyopathy (HCC)     history of this, now resolved  . GERD (gastroesophageal reflux disease)   . Dysphagia     Hx  . Nonsustained ventricular tachycardia (HCC)   . Osteoporosis   . Microcytic anemia   . RBBB (right bundle branch block)   . Asthma     years ago  . History of blood transfusion   . Protein in urine   . Hemorrhoid 04/2014    bleeding  . Fracture of tibial plateau 05/21/2015  . Rheumatic mitral valve disease     Past Surgical History: Past Surgical History  Procedure Laterality Date  . Cholecystectomy    . Mitral valve replacement  1987    St Jude mechanical valve  . Tubal ligation    . US echocardiography  02/2008, 03/2006, 06/2004  . Colonoscopy w/ polypectomy    . Colonoscopy    . Trabeculectomy Right 05/17/2014    Procedure: TRABECULECTOMY WITH MITOMYCIN RIGHT EYE;  Surgeon: Chalmers Guest, MD;  Location: [facility] OR;  Service: Ophthalmology;  Laterality: Right;  . Mitomycin c application Right 05/17/2014    Procedure: MITOMYCIN C APPLICATION;  Surgeon: Chalmers Guest, MD;  Location: [facility] OR;  Service: Ophthalmology;  Laterality: Right;    Social History: Social History  Substance Use Topics  . Smoking status: Former Games developer  . Smokeless tobacco: Never Used     Comment: quiit 1987  . Alcohol Use: No   Additional social history: none.   Please also refer to relevant sections of EMR.  Family History: Family History  Problem Relation Age of Onset  . Hypertension Mother   .  Colon cancer Neg Hx   . Cancer Sister     in the Bladder   (If not completed, MUST add something in)  Allergies and Medications: Allergies  Allergen Reactions  . Esomeprazole Magnesium     REACTION: stomach upset, nausea   Current Facility-Administered Medications on File Prior to Encounter  Medication Dose Route Frequency Provider Last Rate Last Dose  . mitoMYcin (MUTAMYCIN) Injection Use in OR only (0.4 mg/ml)  0.5 mL Right Eye Once Chalmers Guest, MD       Current Outpatient Prescriptions on File Prior to Encounter  Medication Sig Dispense Refill  . Brinzolamide-Brimonidine 1-0.2 % SUSP Place 1 drop into the left eye 3 (three) times daily.     Marland Kitchen CARTIA XT 180 MG 24 hr capsule TAKE 1 CAPSULE BY MOUTH DAILY 90 capsule 2  . Cholecalciferol (VITAMIN D) 2000 UNITS tablet Take 2,000 Units by mouth daily.    Marland Kitchen DIGOX 125  MCG tablet TAKE 1 TABLET BY MOUTH EVERY DAY 90 tablet 3  . lipase/protease/amylase (CREON) 12000 units CPEP capsule Take 48,000 Units by mouth 3 (three) times daily before meals.    . metoprolol (LOPRESSOR) 50 MG tablet TAKE 1 1/2 TABLETS BY MOUTH TWICE DAILY (Patient taking differently: TAKE 1.5 TABLETS (75mg ) BY MOUTH TWICE DAILY) 90 tablet 10  . mirabegron ER (MYRBETRIQ) 25 MG TB24 tablet Take 25 mg by mouth daily.    Marland Kitchen. oxyCODONE 10 MG TABS Take 1 tablet (10 mg total) by mouth every 4 (four) hours while awake. 120 tablet 0  . pantoprazole (PROTONIX) 40 MG tablet Take 40 mg by mouth daily.     . polyethylene glycol (MIRALAX / GLYCOLAX) packet Take 17 g by mouth 2 (two) times daily. 14 each 0  . TRAVATAN Z 0.004 % SOLN ophthalmic solution Place 1 drop into both eyes at bedtime.   12  . warfarin (COUMADIN) 6 MG tablet Take 6 mg by mouth daily.      Objective: BP 164/94 mmHg  Pulse 69  Temp(Src) 98.4 F (36.9 C)  Resp 14  SpO2 98% Exam: General: NAD, AAOx3 Eyes: EOMI, PERRLA ENTM: Nares patent, O/P clear Neck: FROM, Supple Cardiovascular: IRRR, Mitral click, 2+  distal pulses.  Respiratory: CTA BL, Appropriate rate, / unlabored.  Abdomen: S, NT,ND, +BS MSK: MAEW, hematoma looks better from when I saw it last. SMall amount of blood under foam dressing. Slight TTP over anterior tibia, no swelling of LLE or RLE, no calf TTP and negative homan's sign. 2+ distal pulses in BLLE. Trace - 1+ edema of R/L LE.  Skin: In tact, lesion over hematoma as described above, otherwise in tact and without rashes or lesions.  Neuro: No gross deficits.  Psych: Appropriate mood / affect.   Labs and Imaging: CBC BMET   Recent Labs Lab 06/01/15 1540  WBC 7.3  HGB 8.5*  HCT 27.0*  PLT 294    Recent Labs Lab 06/01/15 1540  NA 140  K 3.7  CL 108  CO2 23  BUN 18  CREATININE 1.32*  GLUCOSE 109*  CALCIUM 9.3       Yolande Jollyaleb G Dalayza Zambrana, MD 06/01/2015, 6:43 PM PGY-2, [facility] FPTS Intern pager: [phone], text pages welcome

## 2015-06-01 NOTE — ED Notes (Signed)
[facility]. Pt is currently in rehab there for a tib/fib break, and her rehab MD called EMS rt pt c/o centralized CP x2 hours. Pt states she has been belching, but denies any other associated factors. Pt EKG shows significant depression.

## 2015-06-01 NOTE — ED Notes (Signed)
Pt received 324mg  of ASA PTA.

## 2015-06-02 ENCOUNTER — Observation Stay (HOSPITAL_COMMUNITY): Payer: Medicare Other

## 2015-06-02 ENCOUNTER — Observation Stay (HOSPITAL_BASED_OUTPATIENT_CLINIC_OR_DEPARTMENT_OTHER): Payer: Medicare Other

## 2015-06-02 DIAGNOSIS — I482 Chronic atrial fibrillation: Secondary | ICD-10-CM

## 2015-06-02 DIAGNOSIS — D62 Acute posthemorrhagic anemia: Secondary | ICD-10-CM | POA: Diagnosis not present

## 2015-06-02 DIAGNOSIS — R079 Chest pain, unspecified: Secondary | ICD-10-CM | POA: Diagnosis not present

## 2015-06-02 DIAGNOSIS — N183 Chronic kidney disease, stage 3 (moderate): Secondary | ICD-10-CM | POA: Diagnosis not present

## 2015-06-02 DIAGNOSIS — R072 Precordial pain: Secondary | ICD-10-CM | POA: Diagnosis not present

## 2015-06-02 DIAGNOSIS — I129 Hypertensive chronic kidney disease with stage 1 through stage 4 chronic kidney disease, or unspecified chronic kidney disease: Secondary | ICD-10-CM | POA: Diagnosis not present

## 2015-06-02 DIAGNOSIS — Z954 Presence of other heart-valve replacement: Secondary | ICD-10-CM

## 2015-06-02 LAB — TROPONIN I
Troponin I: 0.03 ng/mL (ref <0.031)
Troponin I: 0.03 ng/mL (ref <0.031)

## 2015-06-02 LAB — ECHOCARDIOGRAM COMPLETE
Height: 67.5 in
Weight: 2960 oz

## 2015-06-02 LAB — LIPID PANEL
Cholesterol: 124 mg/dL (ref 0–200)
HDL: 40 mg/dL — ABNORMAL LOW (ref >40)
LDL Cholesterol: 66 mg/dL (ref 0–99)
Total CHOL/HDL Ratio: 3.1 RATIO
Triglycerides: 91 mg/dL (ref <150)
VLDL: 18 mg/dL (ref 0–40)

## 2015-06-02 LAB — CBC
HCT: 26.9 % — ABNORMAL LOW (ref 36.0–46.0)
Hemoglobin: 8.5 g/dL — ABNORMAL LOW (ref 12.0–15.0)
MCH: 27.8 pg (ref 26.0–34.0)
MCHC: 31.6 g/dL (ref 30.0–36.0)
MCV: 87.9 fL (ref 78.0–100.0)
Platelets: 299 10*3/uL (ref 150–400)
RBC: 3.06 MIL/uL — ABNORMAL LOW (ref 3.87–5.11)
RDW: 15.3 % (ref 11.5–15.5)
WBC: 6.1 10*3/uL (ref 4.0–10.5)

## 2015-06-02 LAB — TYPE AND SCREEN
ABO/RH(D): O POS
Antibody Screen: NEGATIVE

## 2015-06-02 LAB — BASIC METABOLIC PANEL
Anion gap: 11 (ref 5–15)
BUN: 15 mg/dL (ref 6–20)
CO2: 23 mmol/L (ref 22–32)
Calcium: 9.1 mg/dL (ref 8.9–10.3)
Chloride: 106 mmol/L (ref 101–111)
Creatinine, Ser: 1.24 mg/dL — ABNORMAL HIGH (ref 0.44–1.00)
GFR calc Af Amer: 47 mL/min — ABNORMAL LOW (ref >60)
GFR calc non Af Amer: 41 mL/min — ABNORMAL LOW (ref >60)
Glucose, Bld: 107 mg/dL — ABNORMAL HIGH (ref 65–99)
Potassium: 3.5 mmol/L (ref 3.5–5.1)
Sodium: 140 mmol/L (ref 135–145)

## 2015-06-02 LAB — HEPARIN LEVEL (UNFRACTIONATED)
Heparin Unfractionated: 0.32 IU/mL (ref 0.30–0.70)
Heparin Unfractionated: 0.41 IU/mL (ref 0.30–0.70)

## 2015-06-02 LAB — PROTIME-INR
INR: 1.74 — ABNORMAL HIGH (ref 0.00–1.49)
Prothrombin Time: 20.3 seconds — ABNORMAL HIGH (ref 11.6–15.2)

## 2015-06-02 LAB — OCCULT BLOOD X 1 CARD TO LAB, STOOL: Fecal Occult Bld: NEGATIVE

## 2015-06-02 LAB — TSH: TSH: 1.102 u[IU]/mL (ref 0.350–4.500)

## 2015-06-02 MED ORDER — TECHNETIUM TC 99M SESTAMIBI GENERIC - CARDIOLITE
10.0000 | Freq: Once | INTRAVENOUS | Status: AC | PRN
  Administered 2015-06-02: 10 via INTRAVENOUS

## 2015-06-02 MED ORDER — WARFARIN SODIUM 10 MG PO TABS
10.0000 mg | ORAL_TABLET | Freq: Once | ORAL | Status: AC
  Administered 2015-06-02: 10 mg via ORAL
  Filled 2015-06-02: qty 1

## 2015-06-02 MED ORDER — WARFARIN - PHARMACIST DOSING INPATIENT
Freq: Every day | Status: DC
  Administered 2015-06-02 – 2015-06-03 (×2)

## 2015-06-02 MED ORDER — TECHNETIUM TC 99M SESTAMIBI - CARDIOLITE
30.0000 | Freq: Once | INTRAVENOUS | Status: AC | PRN
  Administered 2015-06-02: 30 via INTRAVENOUS

## 2015-06-02 MED ORDER — REGADENOSON 0.4 MG/5ML IV SOLN
INTRAVENOUS | Status: AC
  Filled 2015-06-02: qty 5

## 2015-06-02 MED ORDER — HEPARIN (PORCINE) IN NACL 100-0.45 UNIT/ML-% IJ SOLN
1150.0000 [IU]/h | INTRAMUSCULAR | Status: DC
  Administered 2015-06-02 – 2015-06-04 (×3): 1200 [IU]/h via INTRAVENOUS
  Filled 2015-06-02 (×2): qty 250

## 2015-06-02 MED ORDER — HYDROCHLOROTHIAZIDE 12.5 MG PO CAPS
12.5000 mg | ORAL_CAPSULE | Freq: Every day | ORAL | Status: DC
  Administered 2015-06-02 – 2015-06-03 (×2): 12.5 mg via ORAL
  Filled 2015-06-02 (×3): qty 1

## 2015-06-02 MED ORDER — REGADENOSON 0.4 MG/5ML IV SOLN
0.4000 mg | Freq: Once | INTRAVENOUS | Status: AC
  Administered 2015-06-02: 0.4 mg via INTRAVENOUS
  Filled 2015-06-02: qty 5

## 2015-06-02 NOTE — Progress Notes (Deleted)
ANTICOAGULATION CONSULT NOTE - FOLLOW UP    HL = 0.32 (goal 0.3 - 0.7 units/mL) Heparin dosing weight = 78 kg   Assessment: 79 YOF with possible PE, Afib and MVR to continue on IV heparin while INR is sub-therapeutic on Coumadin.  Heparin level is therapeutic; no bleeding per RN.  Noted heparin level was obtained 4 hours after heparin was resumed and it is actually infusing at 1200 units/hr instead of 1300 units/hr as ordered.   Plan: - Continue heparin gtt at 1200 units/hr - Repeat HL at 2100    Amon Costilla D. Laney Potashang, PharmD, BCPS 06/02/2015, 5:28 PM

## 2015-06-02 NOTE — Progress Notes (Signed)
  Echocardiogram 2D Echocardiogram has been performed.  Tami SavoyCasey N Dearra [name] 06/02/2015, 3:46 PM

## 2015-06-02 NOTE — Progress Notes (Addendum)
Noted drop in hgb this pm. Heparin started an hour ago. Hgb drawn around that time. Went to see patient. Woke her from sleep. She is asymptomatic at this time. No chest pain, no shortness of breath. She has not had a BM since she has been here but feels like she needs to have one. No abdominal pain / discomfort. Leg is stable and without pain.   Vitals:  Hypertensive 160's HR up to 140 at 10pm, but back to 80's on tele ?Hypoxia to 87% in the ED. Floor nurse not aware.   CV: IRRR Abd: S, NT, ND, +BS, DRE performed with occult stool sent to lab, soft brown stool in the vault. No BRBPR. Ext: Hematoma of LLE remains stable in size.   Hgb 7.8 from 8.5  Retic 4.5 D-Dimer 4.9 Trop 0.03 Digoxin Level 0.7  A/P: 80 y/o F ACS r/o, Concern for possible PE, Blood Loss Anemia. D-Dimer up - not surprised given recent fracture and hematoma.  - Unclear where she is losing blood. Hemoccult obtained and sent down to lab.  - The heparin was only started around the time hgb drawn. D/c'd this given that trops have been negative 10 hours out from event, and she is asymptomatic from a chest pain perspective. We would have been treating any potential PE as well, but given the risk of bleeding and the fact that she is otherwise stable hemodynamically, even if she has a PE, I would favor holding the heparin until we are able to find where she may be losing blood.  - My concern is GI bleeding. No evidence for this yet. - - Instructed RN to check vitals. Will order Q hour in the event that she becomes more symptomatic from possible PE.  - Recheck h/h in 4 hours.  - She is asymptomatic at this point, so we are holding transfusion, but if hgb continues to fall will transfuse.  - Type and screen.

## 2015-06-02 NOTE — Discharge Summary (Signed)
Family Medicine Teaching [facility] Discharge Summary  [name] Medical record number: [id number] Date of birth: July 19, 1935 Age: 80 y.o. Gender: female Date of Admission: 06/01/2015  Date of Discharge: 06/04/15 Admitting Physician: Tami MannersWilliam A Hensel, MD  Primary Care Provider: Denny LevySara Neal, MD Consultants: Cardiology  Indication for Hospitalization: Chest Pain (Rule out MI),   Discharge Diagnoses/Problem List:  Chest Pain - Ruled out MI, Resolved S/p Mitral Valve Replacement (mechanical valve, '87), chronic anticoagulation (coumadin) INR 2.5-3.5 Chronic Atrial Fibrillation, on chronic anticoag Tibial Plateau Fracture, Left, closed, healing CKD-III HTN Anemia, in setting of CKD, iron deficiency GERD Osteoporosis Constipation / H/o prior chronic diarrhea  [facility]  Discharge Condition: Stable  Discharge Exam: see previous progress note  Brief Hospital Course:   [name] is a [age] yr female who was transferred to ED from SNF for evaluation of acute onset chest pain x 2 hours on 5/12 associated with some recent stress and anxiety, was during lunch and non-exertional. Patient was currently residing in SNF for short term rehab following recent hospitalization 5/2 to 5/5 for fall with Left tibial plateau fracture. Cardiac history as above significant for s/p MVR, chronic afib, RBBB, non-ischemic cardiomyopathy previously.  In ED, EKG with stable RBBB but some question regarding worsening ST depression in anterior leads, troponin negative, persistent anemia Hgb 8s, Cardiology consulted thought less likely ACS, due to risk indicated to admit for CP observation and likely myoview stress. Found to be sub-therapeutic INR 1.8 (recent coumadin changes), started on Heparin, but concern with anemia recent fracture and hematoma. Additionally, there was some concern for potential PE but without evidence to support, d-dimer elevated but determined likely in  setting post-op, regardless anticoagulation was needed chronically.  During hospital stay, remaining initial work-up for chest pain was negative and ruled out acute MI. [facility] given negative troponins, which was low risk scan without inducible/reversible ischemia.   Upon discharge, patient's INR still remained subtherapeutic. Patient sent home with Lovenox bridge and Coumadin 10 mg for 2 days, will need INR checked on 06/06/15.   Issues for Follow Up:  1. Anticoagulation: Discharged on Coumadin 10 mg daily and bridged with Lovenox 80 mg BID due to sub therapeutic INR. Will need INR check on 5/17. May DC Lovenox at that time if INR is therapeutic. 2. Left tibial plateau fracture: Discharged with continue Knee Immobilizer, non wt bearing Left LE, follow-up with ortho outpatient (Dr [name]) within 1 week. Also sent with HHPT 3.  Consider repeat CBC at follow up. Pt's hgb was 8.5 on discharge (no clear source or reason for bleeding found. FOBT negative)  Significant Procedures:   5/13 Myoview Stress Test IMPRESSION: 1. No definite inducible or reversible ischemia with pharmacologic stress. Fixed inferior wall defect versus attenuation artifact. 2. Normal left ventricular wall motion. 3. Left ventricular ejection fraction 62% 4. Low-risk stress test findings   Significant Labs and Imaging:   Recent Labs Lab 06/02/15 0250 06/03/15 0253 06/04/15 0216  WBC 6.1 6.9 6.2  HGB 8.5* 8.4* 8.5*  HCT 26.9* 27.1* 27.0*  PLT 299 268 338    Recent Labs Lab 05/30/15  06/01/15 1540 06/02/15 0250 06/03/15 0253 06/04/15 1148  NA 138  --  140 140 142 139  K 3.9  < > 3.7 3.5 3.7 3.6  CL 105  --  108 106 106 100*  CO2 20  --  23 23 22 27   GLUCOSE  --   --  109* 107* 96 98  BUN 27*  --  CREATININE 1.61*  --  1.32* 1.24* 1.18* 1.36*  CALCIUM 8.8  --  9.3 9.1 9.2 9.9  ALKPHOS  --   --  46  --   --   --   AST  --   --  24  --   --   --   ALT   --   --  19  --   --   --   ALBUMIN  --   --  2.9*  --   --   --   < > = values in this interval not displayed. Troponin-I x 3 negative  5/12 - D-dimer 4.90  TSH 1.102  FOBT - Negative  Iron 37 / TIBC 319 / Sat ratio 12 / Ferritin 76 / Folate 15.8 / B12 374  Results/Tests Pending at Time of Discharge: none  Discharge Medications:    Medication List    TAKE these medications        Brinzolamide-Brimonidine 1-0.2 % Susp  Place 1 drop into the left eye 3 (three) times daily.     CARTIA XT 180 MG 24 hr capsule  Generic drug:  diltiazem  TAKE 1 CAPSULE BY MOUTH DAILY     DIGOX 0.125 MG tablet  Generic drug:  digoxin  TAKE 1 TABLET BY MOUTH EVERY DAY     enoxaparin 80 MG/0.8ML injection  Commonly known as:  LOVENOX  Inject 0.8 mLs (80 mg total) into the skin every 12 (twelve) hours.     hydrochlorothiazide 12.5 MG capsule  Commonly known as:  MICROZIDE  Take 1 capsule (12.5 mg total) by mouth daily.  Start taking on:  06/05/2015     lipase/protease/amylase [address] units Cpep capsule  Commonly known as:  CREON  Take 48,000 Units by mouth 3 (three) times daily before meals.     metoprolol 50 MG tablet  Commonly known as:  LOPRESSOR  TAKE 1 1/2 TABLETS BY MOUTH TWICE DAILY     mirabegron ER 25 MG Tb24 tablet  Commonly known as:  MYRBETRIQ  Take 25 mg by mouth daily.     Oxycodone HCl 10 MG Tabs  Take 1 tablet (10 mg total) by mouth every 4 (four) hours while awake.     pantoprazole 40 MG tablet  Commonly known as:  PROTONIX  Take 40 mg by mouth daily.     polyethylene glycol packet  Commonly known as:  MIRALAX / GLYCOLAX  Take 17 g by mouth 2 (two) times daily.     TRAVATAN Z 0.004 % Soln ophthalmic solution  Generic drug:  Travoprost (BAK Free)  Place 1 drop into both eyes at bedtime.     Vitamin D 2000 units tablet  Take 2,000 Units by mouth daily.     warfarin 10 MG tablet  Commonly known as:  COUMADIN  Take 1 tablet (10 mg total) by mouth daily.         Discharge Instructions: Please refer to Patient Instructions section of EMR for full details.  Patient was counseled important signs and symptoms that should prompt return to medical care, changes in medications, dietary instructions, activity restrictions, and follow up appointments.   Follow-Up Appointments:   Follow-up Information    Follow up with MURPHY, TIMOTHY D, MD. Schedule an appointment as soon as possible for a visit in 1 week.   Specialty:  Orthopedic Surgery   Why:  hospital follow-up Left  tibia fracture (requested 2 weeks from last hospital stay, 05/25/15)   Contact information:   [address] [place] [address] [phone]       Follow up with Saralyn Pilar, DO. Go on 06/11/2015.   Specialty:  Osteopathic Medicine   Why:  Hospital Follow Up at 2 pm   Contact information:   [address] [place] [address] [phone]       Follow up with Coumadin INR Check . Go on 06/11/2015.   Why:  10:45 am - can go to get INR check before Clinic appointment at 2 pm if desired.       Follow up with [facility].   Why:  HH-RN/PT/OT/aide arranged- they will call to arrange visits   Contact information:   [address] [place] [address] [phone]        Beaulah Dinning, MD 06/02/2015, 11:44 PM PGY-1, [facility]

## 2015-06-02 NOTE — Progress Notes (Signed)
PROGRESS NOTE  Subjective:   80 y.o. female presenting with Chest Pain . PMH is significant for chronic afib, HTN,GERD, Anemia, Mitral Valve Replaced 1987, Chronic Anticoagulation on Coumadin, Recent Tibial Plateau Fracture, RBBB, CKD IIIA  Troponins are negative.  myoview has been done .  Results pending   Objective:    Vital Signs:   Temp:  [98.2 F (36.8 C)-98.5 F (36.9 C)] 98.2 F (36.8 C) (05/13 0039) Pulse Rate:  [50-95] 80 (05/13 1206) Resp:  [12-22] 17 (05/13 0039) BP: (135-177)/(62-133) 156/84 mmHg (05/13 1206) SpO2:  [87 %-100 %] 100 % (05/13 0039) Weight:  [185 lb (83.915 kg)] 185 lb (83.915 kg) (05/12 2122)  Last BM Date:  (No BM in about a week)   24-hour weight change: Weight change:   Weight trends: Filed Weights   06/01/15 2122  Weight: 185 lb (83.915 kg)    Intake/Output:  05/12 0701 - 05/13 0700 In: 200 [P.O.:200] Out: 350 [Urine:350]     Physical Exam: BP 156/84 mmHg  Pulse 80  Temp(Src) 98.2 F (36.8 C) (Oral)  Resp 17  Ht 5' 7.5" (1.715 m)  Wt 185 lb (83.915 kg)  BMI 28.53 kg/m2  SpO2 100%  Wt Readings from Last 3 Encounters:  06/01/15 185 lb (83.915 kg)  05/31/15 182 lb 12.8 oz (82.918 kg)  05/28/15 181 lb 9.6 oz (82.373 kg)    General: Vital signs reviewed and noted. Elderly   Head: Normocephalic, atraumatic.  Eyes: conjunctivae/corneas clear.  EOM's intact.   Throat: normal  Neck:  normal   Lungs:    clear   Heart:  Irreg Irreg.  Mechanical S1   Abdomen:  Soft, non-tender, non-distended    Extremities: No edema    Neurologic: A&O X3, CN II - XII are grossly intact. , speech is somewhat slow  Psych: Normal     Labs: BMET:  Recent Labs  06/01/15 1540 06/02/15 0250  NA 140 140  K 3.7 3.5  CL 108 106  CO2 23 23  GLUCOSE 109* 107*  BUN 18 15  CREATININE 1.32* 1.24*  CALCIUM 9.3 9.1    Liver function tests:  Recent Labs  06/01/15 1540  AST 24  ALT 19  ALKPHOS 46  BILITOT 1.7*  PROT 7.0    ALBUMIN 2.9*   No results for input(s): LIPASE, AMYLASE in the last 72 hours.  CBC:  Recent Labs  06/01/15 1540 06/01/15 2332 06/02/15 0250  WBC 7.3  --  6.1  NEUTROABS 5.1  --   --   HGB 8.5* 7.8* 8.5*  HCT 27.0* 25.5* 26.9*  MCV 88.5  --  87.9  PLT 294  --  299    Cardiac Enzymes:  Recent Labs  06/01/15 2132 06/01/15 2332 06/02/15 0250  TROPONINI 0.03 0.03 0.03    Coagulation Studies:  Recent Labs  06/01/15 1540  LABPROT 21.3*  INR 1.86*    Other: Invalid input(s): POCBNP  Recent Labs  06/01/15 2132  DDIMER 4.90*   No results for input(s): HGBA1C in the last 72 hours. No results for input(s): CHOL, HDL, LDLCALC, TRIG, CHOLHDL in the last 72 hours. No results for input(s): TSH, T4TOTAL, T3FREE, THYROIDAB in the last 72 hours.  Invalid input(s): FREET3  Recent Labs  06/01/15 2130  VITAMINB12 374  FOLATE 15.8  FERRITIN 76  TIBC 319  IRON 37  RETICCTPCT 4.5*     Other results:  EKG  ( personally reviewed )  Jun 02, 2015  Atrial fib,  RBBB ,HR 60   Medications:    Infusions: . heparin 1,200 Units/hr (06/02/15 1208)    Scheduled Medications: . aspirin EC  325 mg Oral Daily  . cholecalciferol  2,000 Units Oral Daily  . digoxin  125 mcg Oral Daily  . diltiazem  180 mg Oral Daily  . hydrochlorothiazide  12.5 mg Oral Daily  . latanoprost  1 drop Both Eyes QHS  . lipase/protease/amylase  48,000 Units Oral TID AC  . metoprolol  75 mg Oral BID  . mirabegron ER  25 mg Oral Daily  . pantoprazole  40 mg Oral Daily  . polyethylene glycol  17 g Oral BID  . regadenoson        Assessment/ Plan:   Principal Problem:   Chest pain with moderate risk for cardiac etiology Active Problems:   History of mitral valve replacement - St Jude mechanical valve   Long term (current) use of anticoagulants   Tibial plateau fracture   CKD (chronic kidney disease), stage III   Anemia due to blood loss, acute   Substernal chest pain   Unstable angina  (HCC)  1. Chest pain :   Very difficult historian. Celine Ahrmyoview is pending.  2. Atrial fib:   Stable  3. Mechanical mitral valve - continue coumadin    If myoview is negative/ low risk , will sign off.  Disposition:  Length of Stay:   Alvia GrovePhilip J. Zhamir Pirro, Jr., MD, [facility]FACC 06/02/2015, 12:16 PM Office [phone] Pager [phone]

## 2015-06-02 NOTE — Progress Notes (Signed)
Initial Nutrition Assessment  INTERVENTION:  Diet advancement: Heart Healthy  Offer snack between meals as desired from nourishment room.  NUTRITION DIAGNOSIS:  Decreased protein needs r/t chronic CKD stage 3 AEB current practice guidelines  GOAL:  Pt to meet >/= 90% of their estimated nutrition needs     MONITOR:  Po intake, labs and wt trends     REASON FOR ASSESSMENT:   Malnutrition Screening Tool    ASSESSMENT:   [name] is a 80 y.o. female presenting with Chest Pain . PMH is significant for chronic afib, HTN,GERD, Anemia, Mitral Valve Replaced 1987, Chronic Anticoagulation on Coumadin, Recent Tibial Plateau Fracture, RBBB, CKD IIIA.  Pt has returned now from stress test and diet is being advanced for lunch. She is c/o of being hungry. Pt is able to communicate food choices and feed herself. Denies chewing problems or swallow difficulty. Her weight hx is stable between 82-83 kg past several years back to( 2013).   Recent Labs Lab 06/01/15 1540 06/02/15 0250  NA 140 140  K 3.7 3.5  CL 108 106  CO2 23 23  BUN 18 15  CREATININE 1.32* 1.24*  CALCIUM 9.3 9.1  GLUCOSE 109* 107*     Meds: Vitamin D (2,000 units qd), Creon (48,000 units before meals),Protonix 40 mg qd, miralax 17 gr BID,  Diet Order:  Diet Heart Room service appropriate?: Yes; Fluid consistency:: Thin  Skin:    dry, intact  Last BM:   PTA  Height:   Ht Readings from Last 1 Encounters:  06/01/15 5' 7.5" (1.715 m)    Weight:   Wt Readings from Last 1 Encounters:  06/01/15 185 lb (83.915 kg)    Ideal Body Weight:  64 kg  BMI:  Body mass index is 28.53 kg/(m^2).  Estimated Nutritional Needs:   Kcal:  2000-2200  Protein:  50-60 gr  Fluid:  2.0-2.2 liters daily (normal needs)  EDUCATION NEEDS: none at this time    Royann ShiversLynn Channie Bostick MS,RD,CSG,LDN Office: #[phone] Pager: [phone]

## 2015-06-02 NOTE — Progress Notes (Signed)
Family Medicine Teaching Service Daily Progress Note Intern Pager: [phone]  [name] Medical record number: [id number] Date of birth: 05/21/1935 Age: 80 y.o. Gender: female  Primary Care Provider: Denny Levy, MD Consultants: Cardiology Code Status: Full  Pt Overview and Major Events to Date:    Assessment and Plan: [name] is a 80 y.o. female presenting with Chest Pain . PMH is significant for chronic afib, HTN,GERD, Anemia, Mitral Valve Replaced 1987, Chronic Anticoagulation on Coumadin, Recent Tibial Plateau Fracture, RBBB, CKD IIIA.  # Chest Pain Rule Out ACS :  VSS, no further chest pain overnight. Heparin stopped over concern for bleeding and decision not to restart. Myoview today. - Admit to [facility] Attending - Lipid panel, TSH, A1C - Cards consulted in the ED recommend Echo / Myoview in the am.  - on Metoprolol - Dig level ok.  - Agree with Cards D-Dimer for initial PE workup.  # Concern for PE - Well's score with mod risk. INR has been around 1.8 which, while not therapeutic for her anticoagulation indication would provide some treatment of a possible DVT / PE. Vitals have been stable. Heparin d/c'd. Hemoglobin now stable.  - Needs coumadin regardless.  - Restart Coumadin today.  - Restart heparin for bridge.  - Follow H/H.   # Afib - Chronic and rate controlled, subtherapeutic INR - she says there has been some confusion about her dose at the SNF.  - Continue Metoprolol, Dilt - Cont Dig - Check Dig level.  - Coumadin.   # HTN - BP elevated here. Goal BP at least < 150/90.  - Continue dilt, metoprolol,  - one time clonidine overnight. No vitals this am yet.  - HCTZ added today.   # Anemia - Unclear cause. Normocytic, normochromic. Hematoma on Left leg is stable and smaller than at D/C last hospitalization. May be bleeding, but hx negative. FOBT neg, needs to have BM today.  - Monitor with BM. Has been constipated recently.  -  Trend H/H.  - Further workup as indicated.  - Txf if <8  # Tibial Plateau Fracture - pain is better, not requiring narcotics. Less Tender to palpation, continues in knee immobilizer.  - Cont knee immobilizer.  - Non weight bearing.  - Ice as needed.   # GERD - protonix BID  # Constipation - enema, miralax not helping. Pt. Wants enema.    Disposition: pending further cardiac workup.   Subjective:  Feels better this am, no chest pain or SOB since she was seen last night. She has slept "ok". She is feeling like she needs to have BM and says she has been constipated and had only 2 BM's since going to [place].   Objective: Temp:  [98.2 F (36.8 C)-98.5 F (36.9 C)] 98.2 F (36.8 C) (05/13 0039) Pulse Rate:  [50-95] 64 (05/13 0039) Resp:  [12-22] 17 (05/13 0039) BP: (135-177)/(62-133) 162/68 mmHg (05/13 0039) SpO2:  [87 %-100 %] 100 % (05/13 0039) Weight:  [185 lb (83.915 kg)] 185 lb (83.915 kg) (05/12 2122) Physical Exam: General: NAD, REsting comfortably  Cardiovascular: IRRR Respiratory: CTA Bilaterally, appropirate rate, unlabored.  Abdomen: S, NT, Mild distension, no masses palpable.  Extremities: WWP, LLE with knee immobilizer in place. Hematoma stable, foam dressing in place over ulceration no saturation.   Laboratory:  Recent Labs Lab 06/01/15 1540 06/01/15 2332 06/02/15 0250  WBC 7.3  --  6.1  HGB 8.5* 7.8* 8.5*  HCT 27.0* 25.5* 26.9*  PLT  294  --  299    Recent Labs Lab 06/01/15 1540 06/02/15 0250  NA 140 140  K 3.7 3.5  CL 108 106  CO2 23 23  BUN 18 15  CREATININE 1.32* 1.24*  CALCIUM 9.3 9.1  PROT 7.0  --   BILITOT 1.7*  --   ALKPHOS 46  --   ALT 19  --   AST 24  --   GLUCOSE 109* 107*    Trops NEg x 3 EKG - ST depression precordial leads.  FOBT - negative.    Imaging/Diagnostic Tests: Echo pending NM study pending  Yolande Jollyaleb G Quame Spratlin, MD 06/02/2015, 8:27 AM PGY-2, [facility] FPTS Intern pager: [phone], text  pages welcome

## 2015-06-02 NOTE — Progress Notes (Addendum)
ANTICOAGULATION CONSULT NOTE  Pharmacy Consult for Heparin  Indication: Mitral valve replacement, possible PE   Allergies  Allergen Reactions  . Esomeprazole Magnesium     REACTION: stomach upset, nausea    Patient Measurements: Height: 5' 7.5" (171.5 cm) Weight: 185 lb (83.915 kg) IBW/kg (Calculated) : 62.75 Heparin Dosing Weight: 78 kg   Vital Signs: BP: 156/84 mmHg (05/13 1206) Pulse Rate: 80 (05/13 1206)  Labs:  Recent Labs  06/01/15 1540 06/01/15 2132 06/01/15 2332 06/02/15 0250 06/02/15 1154 06/02/15 1651  HGB 8.5*  --  7.8* 8.5*  --   --   HCT 27.0*  --  25.5* 26.9*  --   --   PLT 294  --   --  299  --   --   LABPROT 21.3*  --   --   --  20.3*  --   INR 1.86*  --   --   --  1.74*  --   HEPARINUNFRC  --   --   --   --   --  0.32  CREATININE 1.32*  --   --  1.24*  --   --   TROPONINI  --  0.03 0.03 0.03  --   --     Estimated Creatinine Clearance: 42.7 mL/min (by C-G formula based on Cr of 1.24).   Assessment: 80 yo woman admitted 06/01/2015 for CP. D dimer 4.9. Pharmacy consulted to dose heparin and warfarin. PMH St Jude mech MVR [phone](1987), chronic afib, HTN,GERD, anemia, recent tibial plateau fx, CKD III.  INR on admit subtherapeutic at 1.86, down to 1.74 this morning. Initial heparin level therapeutic but on the low end at 0.32 units/mL- however this level was drawn ~4h too early.  Hgb 8.5, stable past two days however down from 10.8 last admission earlier this month. Plt wnl.   Goal of Therapy:  Heparin level 0.3-0.7 units/ml INR 2.5-3.5 Monitor platelets by anticoagulation protocol: Yes   Plan:  -Continue heparin at 1200 units/hr -2100 HL -warfarin 10mg  po x1 tonight  -Daily HL, INR, CBC -Monitor s/sx bleeding   Venessa Wickham D. Allyce Bochicchio, PharmD, BCPS Clinical Pharmacist Pager: [phone] 06/02/2015 5:30 PM   ADDENDUM Repeat level drawn at correct time in range at 0.41 units/mL.  Plan: Continue heparin 1200 units/hr Daily HL and CBC  Kasidee Voisin D.  Merikay Lesniewski, PharmD, BCPS Clinical Pharmacist Pager: [phone] 06/02/2015 9:25 PM

## 2015-06-02 NOTE — Progress Notes (Addendum)
ANTICOAGULATION CONSULT NOTE - Initial Consult  Pharmacy Consult for Heparin  Indication: Mitral valve replacement, possible PE   Allergies  Allergen Reactions  . Esomeprazole Magnesium     REACTION: stomach upset, nausea    Patient Measurements: Height: 5' 7.5" (171.5 cm) Weight: 185 lb (83.915 kg) IBW/kg (Calculated) : 62.75 Heparin Dosing Weight: 78 kg   Vital Signs: Temp: 98.2 F (36.8 C) (05/13 0039) Temp Source: Oral (05/13 0039) BP: 162/68 mmHg (05/13 0039) Pulse Rate: 64 (05/13 0039)  Labs:  Recent Labs  06/01/15 1540 06/01/15 2132 06/01/15 2332 06/02/15 0250  HGB 8.5*  --  7.8* 8.5*  HCT 27.0*  --  25.5* 26.9*  PLT 294  --   --  299  LABPROT 21.3*  --   --   --   INR 1.86*  --   --   --   CREATININE 1.32*  --   --  1.24*  TROPONINI  --  0.03 0.03 0.03    Estimated Creatinine Clearance: 42.7 mL/min (by C-G formula based on Cr of 1.24).   Assessment: 80 yo woman admitted 06/01/2015 for CP. D dimer 4.9. Pharmacy consulted to dose heparin and warfarin. PMH St Jude mech MVR [phone](1987), chronic afib, HTN,GERD, anemia, recent tibial plateau fx, CKD III.  INR on admit subtherapeutic at 1.86, down to 1.74 this morning.  Hgb 8.5, stable past two days however down from 10.8 last admission earlier this month. Plt wnl. Was started on heparin gtt at 1200 units/h which was d/c overnight d/t concern for bleeding, now resumed.  Goal of Therapy:  Heparin level 0.3-0.7 units/ml INR 2.5-3.5 Monitor platelets by anticoagulation protocol: Yes   Plan:  -Resume heparin 1200 units/hr -1700 HL -warfarin 10mg  po x1 tonight  -Daily HL, INR, CBC -Monitor s/sx bleeding   Chinmayi Rumer D. Aleli Navedo, PharmD, BCPS Clinical Pharmacist Pager: [phone] 06/02/2015 12:48 PM

## 2015-06-02 NOTE — Progress Notes (Signed)
Stress test results are reviewed: IMPRESSION: 1. No definite inducible or reversible ischemia with pharmacologic stress.  Fixed inferior wall defect versus attenuation artifact.  2. Normal left ventricular wall motion.  3. Left ventricular ejection fraction 62%  4. Low-risk stress test findings

## 2015-06-03 DIAGNOSIS — R079 Chest pain, unspecified: Secondary | ICD-10-CM | POA: Diagnosis not present

## 2015-06-03 DIAGNOSIS — N183 Chronic kidney disease, stage 3 (moderate): Secondary | ICD-10-CM | POA: Diagnosis not present

## 2015-06-03 DIAGNOSIS — I482 Chronic atrial fibrillation: Secondary | ICD-10-CM | POA: Diagnosis not present

## 2015-06-03 DIAGNOSIS — D62 Acute posthemorrhagic anemia: Secondary | ICD-10-CM | POA: Diagnosis not present

## 2015-06-03 DIAGNOSIS — Z954 Presence of other heart-valve replacement: Secondary | ICD-10-CM | POA: Diagnosis not present

## 2015-06-03 LAB — BASIC METABOLIC PANEL
Anion gap: 14 (ref 5–15)
BUN: 13 mg/dL (ref 6–20)
CO2: 22 mmol/L (ref 22–32)
Calcium: 9.2 mg/dL (ref 8.9–10.3)
Chloride: 106 mmol/L (ref 101–111)
Creatinine, Ser: 1.18 mg/dL — ABNORMAL HIGH (ref 0.44–1.00)
GFR calc Af Amer: 50 mL/min — ABNORMAL LOW (ref >60)
GFR calc non Af Amer: 43 mL/min — ABNORMAL LOW (ref >60)
Glucose, Bld: 96 mg/dL (ref 65–99)
Potassium: 3.7 mmol/L (ref 3.5–5.1)
Sodium: 142 mmol/L (ref 135–145)

## 2015-06-03 LAB — CBC
HCT: 27.1 % — ABNORMAL LOW (ref 36.0–46.0)
Hemoglobin: 8.4 g/dL — ABNORMAL LOW (ref 12.0–15.0)
MCH: 27.5 pg (ref 26.0–34.0)
MCHC: 31 g/dL (ref 30.0–36.0)
MCV: 88.6 fL (ref 78.0–100.0)
Platelets: 268 10*3/uL (ref 150–400)
RBC: 3.06 MIL/uL — ABNORMAL LOW (ref 3.87–5.11)
RDW: 15.5 % (ref 11.5–15.5)
WBC: 6.9 10*3/uL (ref 4.0–10.5)

## 2015-06-03 LAB — PROTIME-INR
INR: 1.82 — ABNORMAL HIGH (ref 0.00–1.49)
Prothrombin Time: 21 seconds — ABNORMAL HIGH (ref 11.6–15.2)

## 2015-06-03 LAB — HEPARIN LEVEL (UNFRACTIONATED): Heparin Unfractionated: 0.4 IU/mL (ref 0.30–0.70)

## 2015-06-03 MED ORDER — WARFARIN SODIUM 10 MG PO TABS
10.0000 mg | ORAL_TABLET | Freq: Once | ORAL | Status: AC
  Administered 2015-06-03: 10 mg via ORAL
  Filled 2015-06-03: qty 1

## 2015-06-03 NOTE — Progress Notes (Signed)
ANTICOAGULATION CONSULT NOTE  Pharmacy Consult for Heparin + warfarin Indication: Mitral valve replacement, possible PE   Allergies  Allergen Reactions  . Esomeprazole Magnesium     REACTION: stomach upset, nausea    Patient Measurements: Height: 5' 7.5" (171.5 cm) Weight: 185 lb (83.915 kg) IBW/kg (Calculated) : 62.75 Heparin Dosing Weight: 78 kg   Vital Signs: Temp: 98.3 F (36.8 C) (05/14 0446) Temp Source: Oral (05/14 0446) BP: 160/73 mmHg (05/14 0833) Pulse Rate: 113 (05/14 0833)  Labs:  Recent Labs  06/01/15 1540 06/01/15 2132 06/01/15 2332 06/02/15 0250 06/02/15 1154 06/02/15 1651 06/02/15 2043 06/03/15 0253  HGB 8.5*  --  7.8* 8.5*  --   --   --  8.4*  HCT 27.0*  --  25.5* 26.9*  --   --   --  27.1*  PLT 294  --   --  299  --   --   --  268  LABPROT 21.3*  --   --   --  20.3*  --   --   --   INR 1.86*  --   --   --  1.74*  --   --   --   HEPARINUNFRC  --   --   --   --   --  0.32 0.41 0.40  CREATININE 1.32*  --   --  1.24*  --   --   --  1.18*  TROPONINI  --  0.03 0.03 0.03  --   --   --   --     Estimated Creatinine Clearance: 44.9 mL/min (by C-G formula based on Cr of 1.18).   Assessment: 80 yo woman admitted 06/01/2015 for CP. D dimer 4.9. Pharmacy consulted to dose heparin and warfarin for concerns of PE. PMH St Jude mech MVR (1987), chronic afib, HTN,GERD, anemia, recent tibial plateau fx, CKD III.  Heparin level therapeutic x2 on 1200 units/hr. INR on admit subtherapeutic at 1.86, currently 1.82  Hgb 8.4, stable over this admission, but down from 10.8 last admission earlier this month. Plt wnl- no bleeding noted, hemoccult negative.  Goal of Therapy:  Heparin level 0.3-0.7 units/ml INR 2.5-3.5 Monitor platelets by anticoagulation protocol: Yes   Plan:  -Continue heparin at 1200 units/hr -warfarin 10mg  po x1 tonight  -Daily HL, INR, CBC -Monitor s/sx bleeding   Alontae Chaloux D. Jahrel Borthwick, PharmD, BCPS Clinical Pharmacist Pager:  [phone] 06/03/2015 11:40 AM

## 2015-06-03 NOTE — Progress Notes (Signed)
Family Medicine Teaching Service Daily Progress Note Intern Pager: [phone]  [name] Medical record number: [id number] Date of birth: 12/13/1935 Age: 80 y.o. Gender: female  Primary Care Provider: Denny Levy, MD Consultants: Cardiology Code Status: Full  Pt Overview and Major Events to Date:  5/12: Admitted CP from SNF, placed on heparin, coumadin sub therapeutic 5/13: CP resolved. Cardiology > myoview low risk 5/14: Cont Heparin and Coumadin per pharm, do not plan to bridge on DC. PT >> consider DC home with HH instead of SNF  Assessment and Plan: [name] is a 80 y.o. female presenting with Chest Pain . PMH is significant for chronic afib, HTN,GERD, Anemia, Mitral Valve Replaced 1987, Chronic Anticoagulation on Coumadin, Recent Tibial Plateau Fracture, RBBB, CKD IIIA.  # Chest Pain - Ruled out MI / Low risk myoview stress - Resolved Clinically resolved. Hemodynamically stable. H/o RBBB with chronic ST depressions, s/p MVR - Cardiology followed in hospital, clearance after negative low risk myoview on 5/13 - Continue home cardiac meds  # S/p Mitral Valve Replacement ('87), mechanical valve / INR 2.5 - 3.5 - Sub therapeutic - Coumadin per Pharmacy consult, currently on Heparin IV to bridge while in hospital, but will not discharge with Lovenox bridge.  # Chronic Atrial fibrillation - Chronic and rate controlled, subtherapeutic INR. Recent week of persistent sub therapeutic given prior dose changes in transition to SNF. - Continue Metoprolol, Dilt - Cont Dig - Coumadin per Pharmacy consult, currently on Heparin IV to bridge while in hospital, but will not discharge with Lovenox bridge.  # HTN - BP elevated here. Goal BP at least < 150/90.  - Continue dilt, metoprolol,  - HCTZ added 5/13  # Anemia - Unclear cause. Normocytic, normochromic. Hematoma on Left leg is stable, likely etiology of recent decline. No other clinical evidence of bleeding. FOBT  negative - Monitor with BM. Has been constipated recently. - Trend H/H.  - Further workup as indicated.  - Txf if <8  # Tibial Plateau Fracture, Left - Stable (initial injury 5/2) Stable. Per Ortho (Dr Eulah Pont) continue non-weight bearing Left lower ext until follow-up within 1 week. - Pain improved - Knee immobilizer in place - Non weight bearing - Ordered PT eval, consider improvement may qualify for DC to home with [facility] and inc family supervision, likely Monday 5/15   # GERD Resume protonix 40 daily  # Constipation On home creon Miralax and s/p enema yesterday  Disposition: Cleared from cardiac perspective by chest pain with low risk stress test, now working with PT and determining disposition regarding Left tibial fracture fracture. Anticipate may progress to discharge to home with [facility] possibly by 5/15.  Subjective:  Reports doing well this morning. Good appetite, eating breakfast. Family at bedside in good spirits. Patient denies any further chest pain or SOB. Family and patient express wishes to hopefully be discharged to home instead of SNF. Remains non weight bearing Left lower ext, awaiting PT for evaluation determine status. Family acknowledges increased supervision available now.  Objective: Temp:  [98.3 F (36.8 C)-98.4 F (36.9 C)] 98.3 F (36.8 C) (05/14 0446) Pulse Rate:  [60-113] 113 (05/14 0833) Resp:  [18] 18 (05/14 0446) BP: (125-160)/(58-84) 160/73 mmHg (05/14 0833) SpO2:  [99 %] 99 % (05/14 0446) Physical Exam: General: NAD, sitting up on side of bed eating breakfast, pleasant, comfortable, family at bedside  Cardiovascular: irregularly irregular Respiratory: Clear to auscultation, good air movement Abdomen: Soft, NT, minimal distension, no masses palpable.  Extremities: warm, distal pulses intact +2, LLE with knee immobilizer in place, Left lower ext knee improved ROM, remains non-wt bearing per orders, gait not tested  Laboratory:  Recent Labs Lab  06/01/15 1540 06/01/15 2332 06/02/15 0250 06/03/15 0253  WBC 7.3  --  6.1 6.9  HGB 8.5* 7.8* 8.5* 8.4*  HCT 27.0* 25.5* 26.9* 27.1*  PLT 294  --  299 268    Recent Labs Lab 06/01/15 1540 06/02/15 0250 06/03/15 0253  NA 140 140 142  K 3.7 3.5 3.7  CL 108 106 106  CO2 23 23 22   BUN 18 15 13   CREATININE 1.32* 1.24* 1.18*  CALCIUM 9.3 9.1 9.2  PROT 7.0  --   --   BILITOT 1.7*  --   --   ALKPHOS 46  --   --   ALT 19  --   --   AST 24  --   --   GLUCOSE 109* 107* 96    Trops Neg x 3 EKG - ST depression precordial leads.  FOBT - negative.   Imaging/Diagnostic Tests:  5/13 ECHO Study Conclusions  - Left ventricle: The cavity size was normal. Wall thickness was  increased in a pattern of mild LVH. Systolic function was normal.  The estimated ejection fraction was in the range of 50% to 55%.  Possible mild hypokinesis of the inferior myocardium. - Aortic valve: There was moderate stenosis. There was trivial  regurgitation. Valve area (VTI): 1.46 cm^2. Valve area (Vmax):  1.36 cm^2. Valve area (Vmean): 1.4 cm^2. - Mitral valve: A mechanical prosthesis was present and functioning  normally. Valve area by continuity equation (using LVOT flow):  1.39 cm^2. - Left atrium: The atrium was severely dilated. - Right ventricle: The cavity size was mildly dilated. Systolic  function was reduced. - Right atrium: The atrium was severely dilated. - Pulmonary arteries: Systolic pressure was moderately increased.  PA peak pressure: 59 mm Hg (S).   5/13 Myoview Stress Test IMPRESSION: 1. No definite inducible or reversible ischemia with pharmacologic stress. Fixed inferior wall defect versus attenuation artifact. 2. Normal left ventricular wall motion. 3. Left ventricular ejection fraction 62% 4. Low-risk stress test findings  Smitty CordsAlexander J Kaziyah Parkison, DO 06/03/2015, 11:13 AM PGY-3, [place] [facility] FPTS Intern pager: [phone], text pages welcome

## 2015-06-03 NOTE — Evaluation (Signed)
Physical Therapy Evaluation Patient Details Name: [name] MRN: [id number] DOB: May 10, 1935 Today's Date: 06/03/2015   History of Present Illness  Patient is a 80 y/o female with hx of chronic A-fib, GERD, osteoporosis, s/p MVR, chronic anticoagulation, RBBB, CKD III, recent left tibial plateau fx presents with chest pain. EKG with inferior lead depression. [facility].  Clinical Impression  Patient presents with generalized weakness, impaired memory, decreased safety awareness, balance deficits and impaired mobility secondary to difficulty maintaining NWB through LLE. Despite cues to adhere to WB status, pt placing weight through LLE. Pt reports she can go home with daughter but then says she will be home alone during the day. Requires Min A for ambulation at this time. NOT safe to be home alone. Requires hands on assist and cues to maintain NWB. Would benefit from return to ST SNF to improve mobility, strength and overall safety awareness. If pt refuses SNF, pt will need to maximize home health services- HHPT, HHPT, aide and 24/7 hands on assist/supervision from family. Will follow.    Follow Up Recommendations SNF;Supervision for mobility/OOB    Equipment Recommendations  None recommended by PT    Recommendations for Other Services OT consult     Precautions / Restrictions Precautions Precautions: Fall Required Braces or Orthoses: Knee Immobilizer - Left Knee Immobilizer - Left: On at all times Restrictions Weight Bearing Restrictions: Yes LLE Weight Bearing: Non weight bearing Other Position/Activity Restrictions: Precautions taken from prior admission ~1 week ago.      Mobility  Bed Mobility Overal bed mobility: Needs Assistance Bed Mobility: Supine to Sit     Supine to sit: Supervision     General bed mobility comments: SUpervision for safety. Use of rail for support.   Transfers Overall transfer level: Needs assistance Equipment used: Rolling  walker (2 wheeled) Transfers: Sit to/from Stand Sit to Stand: Min guard         General transfer comment: Min guard to boost from EOB with cues for hand placement as pt pulling up on RW. Cues to adhere to NWB LLE, despite cues on second attempt pt placing weight through LLE. Stood from AllstateEOB x1, from chair x1. Transferred to chair post ambulation bour.  Ambulation/Gait Ambulation/Gait assistance: Min assist Ambulation Distance (Feet): 14 Feet (+ 30') Assistive device: Rolling walker (2 wheeled) Gait Pattern/deviations: Step-to pattern;Trunk flexed ("hop to") Gait velocity: decreased   General Gait Details: cues for safe use of DME, safety and NWB status; one seated rest break needed; unsteady throughout with one LOB with min A required to prevent fall. Fatigues.   Stairs            Wheelchair Mobility    Modified Rankin (Stroke Patients Only)       Balance Overall balance assessment: Needs assistance Sitting-balance support: Feet supported;No upper extremity supported Sitting balance-Leahy Scale: Good     Standing balance support: During functional activity Standing balance-Leahy Scale: Poor Standing balance comment: Reliant on BUes for support; not able to maintain NWB LLE despite cues. Forgets?                             Pertinent Vitals/Pain Pain Assessment: No/denies pain    Home Living Family/patient expects to be discharged to:: Private residence Living Arrangements: Alone Available Help at Discharge: Family;Available PRN/intermittently Type of Home: House Home Access: Level entry     Home Layout: One level Home Equipment: Walker - 2 wheels;Bedside commode;Wheelchair -  manual;Shower seat      Prior Function Level of Independence: Needs assistance   Gait / Transfers Assistance Needed: Uses RW for ambulation due to NWB LLE.  ADL's / Homemaking Assistance Needed: Has required assist [facility] ADLs at [facility].  Comments: Pt reports either  staying at her apt in a Retirement community or staying with her daughter but either place she will be alone.      Hand Dominance        Extremity/Trunk Assessment   Upper Extremity Assessment: Defer to OT evaluation           Lower Extremity Assessment: Generalized weakness         Communication   Communication: No difficulties  Cognition Arousal/Alertness: Awake/alert Behavior During Therapy: WFL for tasks assessed/performed Overall Cognitive Status: Impaired/Different from baseline Area of Impairment: Safety/judgement;Memory;Awareness;Problem solving     Memory: Decreased short-term memory;Decreased recall of precautions   Safety/Judgement: Decreased awareness of safety Awareness: Intellectual Problem Solving: Slow processing;Requires verbal cues General Comments: not able to state WB status of LLE when asked in beginning of session. Despite being able to state NWB in middle of session, pt getting up and placing weight through it- decreased short term memory?    General Comments General comments (skin integrity, edema, etc.): Discussed concern about pt going home. Pt NOT safe to be home alone during the day.    Exercises        Assessment/Plan    PT Assessment Patient needs continued PT services  PT Diagnosis Generalized weakness;Difficulty walking   PT Problem List Decreased strength;Decreased cognition;Decreased activity tolerance;Decreased balance;Decreased mobility;Decreased safety awareness;Decreased knowledge of use of DME;Decreased knowledge of precautions  PT Treatment Interventions Balance training;Gait training;Functional mobility training;Therapeutic activities;Therapeutic exercise;Wheelchair mobility training;Patient/family education;DME instruction   PT Goals (Current goals can be found in the Care Plan section) Acute Rehab PT Goals Patient Stated Goal: to go home with family assist PT Goal Formulation: With patient Time For Goal Achievement:  06/17/15 Potential to Achieve Goals: Good    Frequency Min 3X/week   Barriers to discharge Decreased caregiver support Pt will be home alone during the day    Co-evaluation               End of Session Equipment Utilized During Treatment: Gait belt;Left knee immobilizer Activity Tolerance: Patient tolerated treatment well Patient left: in chair;with call bell/phone within reach Nurse Communication: Mobility status    Functional Assessment Tool Used: clinical judgment Functional Limitation: Mobility: Walking and moving around Mobility: Walking and Moving Around Current Status (Z6109): At least 20 percent but less than 40 percent impaired, limited or restricted Mobility: Walking and Moving Around Goal Status [phone]): At least 1 percent but less than 20 percent impaired, limited or restricted    Time: 0981-1914 PT Time Calculation (min) (ACUTE ONLY): 26 min   Charges:   PT Evaluation $PT Eval Moderate Complexity: 1 Procedure PT Treatments $Gait Training: 8-22 mins   PT G Codes:   PT G-Codes **NOT FOR INPATIENT CLASS** Functional Assessment Tool Used: clinical judgment Functional Limitation: Mobility: Walking and moving around Mobility: Walking and Moving Around Current Status (N8295): At least 20 percent but less than 40 percent impaired, limited or restricted Mobility: Walking and Moving Around Goal Status [phone]): At least 1 percent but less than 20 percent impaired, limited or restricted    Kemara Quigley A Malcom Selmer 06/03/2015, 2:16 PM Mylo Red, PT, DPT [phone]

## 2015-06-03 NOTE — Progress Notes (Signed)
    myoview is low risk The fixed defect with normal LV function and no segmantal wall motion abn. Is c/w artifact.   Will sign off.     Kristeen MissPhilip Russell Quinney, MD  06/03/2015 11:30 AM    [facility] [address],  [address] [place], [place]  [address] Pager [phone] Phone: [phone]; Fax: [phone]

## 2015-06-04 DIAGNOSIS — R079 Chest pain, unspecified: Secondary | ICD-10-CM | POA: Diagnosis not present

## 2015-06-04 DIAGNOSIS — Z7901 Long term (current) use of anticoagulants: Secondary | ICD-10-CM

## 2015-06-04 DIAGNOSIS — I482 Chronic atrial fibrillation: Secondary | ICD-10-CM | POA: Diagnosis not present

## 2015-06-04 DIAGNOSIS — N183 Chronic kidney disease, stage 3 (moderate): Secondary | ICD-10-CM | POA: Diagnosis not present

## 2015-06-04 DIAGNOSIS — S82142D Displaced bicondylar fracture of left tibia, subsequent encounter for closed fracture with routine healing: Secondary | ICD-10-CM

## 2015-06-04 DIAGNOSIS — D62 Acute posthemorrhagic anemia: Secondary | ICD-10-CM | POA: Diagnosis not present

## 2015-06-04 LAB — HEMOGLOBIN A1C
Hgb A1c MFr Bld: 5.8 % — ABNORMAL HIGH (ref 4.8–5.6)
Mean Plasma Glucose: 120 mg/dL

## 2015-06-04 LAB — BASIC METABOLIC PANEL
Anion gap: 12 (ref 5–15)
BUN: 27 — AB (ref 7–25)
BUN: 14 mg/dL (ref 6–20)
CO2: 20 mmol/L (ref 20–31)
CO2: 27 mmol/L (ref 22–32)
Calcium: 8.8 mg/dL (ref 8.6–10.4)
Calcium: 9.9 mg/dL (ref 8.9–10.3)
Chloride: 105 mmol/L (ref 98–110)
Chloride: 100 mmol/L — ABNORMAL LOW (ref 101–111)
Creat: 1.61 — AB (ref 0.60–0.93)
Creatinine, Ser: 1.36 mg/dL — ABNORMAL HIGH (ref 0.44–1.00)
GFR calc Af Amer: 42 mL/min — ABNORMAL LOW (ref >60)
GFR calc non Af Amer: 36 mL/min — ABNORMAL LOW (ref >60)
Glucose, Bld: 98 mg/dL (ref 65–99)
Glucose: 102 — AB (ref 65–99)
Potassium: 3.9 mmol/L (ref 3.5–5.3)
Potassium: 3.6 mmol/L (ref 3.5–5.1)
Sodium: 138 (ref 135–146)
Sodium: 139 mmol/L (ref 135–145)

## 2015-06-04 LAB — CBC WITH DIFFERENTIAL
Basophil %: 0
Basophils Absolute: 0 /uL (ref 0–200)
Eosinophil %: 1
Eosinophils Absolute: 80 /uL (ref 15–500)
HCT: 26 % — AB (ref 35–45)
Hemoglobin: 8.4 — AB (ref 11.7–15.5)
Lymphocyte %: 24
Lymphocytes Absolute: 1920 /uL (ref 850–3900)
MCH: 27.1 (ref 27–33)
MCHC: 32.4 (ref 32–36)
MCV: 83.5 (ref 80–100)
MPV: 9.7 fL (ref 7.5–11.5)
Monocyte %: 8
Monocytes Absolute: 640 /uL (ref 200–950)
Neutrophils Absolute: 5360 /uL (ref 1500–7800)
Neutrophils, %: 67
Platelet: 295 (ref 140–400)
RBC: 3.1 — AB (ref 3.80–5.10)
RDW: 15.2 — AB (ref 11–15)
WBC: 8 (ref 3.8–10.8)

## 2015-06-04 LAB — CBC
HCT: 27 % — ABNORMAL LOW (ref 36.0–46.0)
Hemoglobin: 8.5 g/dL — ABNORMAL LOW (ref 12.0–15.0)
MCH: 27.4 pg (ref 26.0–34.0)
MCHC: 31.5 g/dL (ref 30.0–36.0)
MCV: 87.1 fL (ref 78.0–100.0)
Platelets: 338 10*3/uL (ref 150–400)
RBC: 3.1 MIL/uL — ABNORMAL LOW (ref 3.87–5.11)
RDW: 15.5 % (ref 11.5–15.5)
WBC: 6.2 10*3/uL (ref 4.0–10.5)

## 2015-06-04 LAB — PROTIME-INR
INR: 1.94 — ABNORMAL HIGH (ref 0.00–1.49)
INR: 1.6 — AB (ref 0.9–1.1)
INR: 1.82 — ABNORMAL HIGH (ref 0.00–1.49)
Prothrombin Time: 22.1 seconds — ABNORMAL HIGH (ref 11.6–15.2)
Prothrombin Time: 19.3
Prothrombin Time: 21 seconds — ABNORMAL HIGH (ref 11.6–15.2)

## 2015-06-04 LAB — HEPARIN LEVEL (UNFRACTIONATED): Heparin Unfractionated: 0.69 IU/mL (ref 0.30–0.70)

## 2015-06-04 MED ORDER — HYDROCHLOROTHIAZIDE 12.5 MG PO CAPS: 12.5000 mg | ORAL_CAPSULE | Freq: Every day | ORAL | Status: DC

## 2015-06-04 MED ORDER — ENOXAPARIN SODIUM 80 MG/0.8ML ~~LOC~~ SOLN
80.0000 mg | Freq: Two times a day (BID) | SUBCUTANEOUS | Status: DC
  Administered 2015-06-04: 80 mg via SUBCUTANEOUS
  Filled 2015-06-04: qty 0.8

## 2015-06-04 MED ORDER — HYDROCHLOROTHIAZIDE 25 MG PO TABS
25.0000 mg | ORAL_TABLET | Freq: Every day | ORAL | Status: DC
  Administered 2015-06-04: 25 mg via ORAL
  Filled 2015-06-04: qty 1

## 2015-06-04 MED ORDER — WARFARIN SODIUM 10 MG PO TABS: 10.0000 mg | ORAL_TABLET | Freq: Once | ORAL | Status: DC

## 2015-06-04 MED ORDER — ENOXAPARIN SODIUM 80 MG/0.8ML ~~LOC~~ SOLN: 80.0000 mg | Freq: Two times a day (BID) | SUBCUTANEOUS | Status: DC

## 2015-06-04 MED ORDER — WARFARIN SODIUM 10 MG PO TABS: 10.0000 mg | ORAL_TABLET | Freq: Every day | ORAL | Status: DC

## 2015-06-04 NOTE — Care Management Obs Status (Signed)
MEDICARE OBSERVATION STATUS NOTIFICATION   Patient Details  Name: Tami MatterSally M Dellarocco MRN: [id number] Date of Birth: October 18, 1935   Medicare Observation Status Notification Given:  Yes    Darrold SpanWebster, Salome Hautala Hall, RN 06/04/2015, 2:52 PM

## 2015-06-04 NOTE — Progress Notes (Signed)
Family Medicine Teaching Service Daily Progress Note Intern Pager: [phone]  [name] Medical record number: [id number] Date of birth: [birth year] Age: 80 y.o. Gender: female  Primary Care Provider: Denny LevySara Neal, MD Consultants: Cardiology Code Status: Full  Pt Overview and Major Events to Date:  5/12: Admitted CP from SNF, placed on heparin, coumadin sub therapeutic 5/13: CP resolved. Cardiology > myoview low risk 5/14: Cont Heparin and Coumadin per pharm, do not plan to bridge on DC. PT >> consider DC home with HH instead of SNF  Assessment and Plan: [name] is a 80 y.o. female presenting with Chest Pain . PMH is significant for chronic afib, HTN,GERD, Anemia, Mitral Valve Replaced 1987, Chronic Anticoagulation on Coumadin, Recent Tibial Plateau Fracture, RBBB, CKD IIIA.  # Chest Pain - Ruled out MI / Low risk myoview stress - Resolved Clinically resolved. Hemodynamically stable. H/o RBBB with chronic ST depressions, s/p MVR - Cardiology followed in hospital, clearance after negative low risk myoview on 5/13 - Continue home cardiac meds  # S/p Mitral Valve Replacement ('87), mechanical valve / INR 2.5 - 3.5 - Sub therapeutic - Coumadin per Pharmacy consult, currently on Heparin IV to bridge while in hospital. Due to mechanical valve, patient WILL need Lovenox bridge.  - INR 1.93 early this AM, 1.82 - DC Heparin today, Lovenox now for bridging anticoagulation  # Chronic Atrial fibrillation - Chronic and rate controlled, subtherapeutic INR. Recent week of persistent sub therapeutic given prior dose changes in transition to SNF. - Continue Metoprolol, Dilt - Cont Dig - Coumadin per Pharmacy consult, currently on Heparin IV to bridge while in hospital, but will not discharge with Lovenox bridge.  # HTN - BP elevated here, up to 160s systolic at times. Goal BP at least < 150/90.  - Continue dilt, metoprolol,  - HCTZ added 5/13 and will increase to 25 mg daily  today (will need to watch for hypotension) and resume home dose on discharge  # Anemia - Unclear cause. Normocytic, normochromic. Hematoma on Left leg is stable, likely etiology of recent decline. No other clinical evidence of bleeding. FOBT negative. Hgb 8.5 today - Monitor with BM. Has been constipated recently. - Trend H/H.  - Further workup as indicated.  - Txf if <8   # Tibial Plateau Fracture, Left - Stable (initial injury 5/2) Stable. Per Ortho (Dr Eulah PontMurphy) continue non-weight bearing Left lower ext until follow-up within 1 week. - Pain improved - Knee immobilizer in place - Non weight bearing - Ordered PT eval, recommended SNF. [facility] family supervision, hopefully today.  # GERD Resume protonix 40 daily  # Constipation On home creon Miralax and s/p enema yesterday  Disposition: Awaiting DC to Home with HH PT  Subjective:  No complaints this morning. Patient would like to go home. Has a good appetite and is still in good spirits.  Patient denies any further chest pain or SOB. Remains non weight bearing Left lower ext.   Objective: Temp:  [98 F (36.7 C)-98.2 F (36.8 C)] 98 F (36.7 C) (05/15 0346) Pulse Rate:  [56-113] 56 (05/15 0346) Resp:  [18] 18 (05/15 0346) BP: (159-169)/(73-80) 169/76 mmHg (05/15 0346) SpO2:  [98 %] 98 % (05/15 0346) Physical Exam: General: NAD, sitting up on side of bed eating breakfast, pleasant, comfortable, family at bedside  Cardiovascular: irregularly irregular Respiratory: Clear to auscultation, good air movement Abdomen: Soft, NT, minimal distension, no masses palpable.  Extremities: warm, distal  pulses intact +2, LLE with knee immobilizer in place, Left lower ext knee improved ROM, remains non-wt bearing per orders, gait not tested  Laboratory:  Recent Labs Lab 06/02/15 0250 06/03/15 0253 06/04/15 0216  WBC 6.1 6.9 6.2  HGB 8.5* 8.4* 8.5*  HCT 26.9* 27.1* 27.0*  PLT 299 268 338    Recent  Labs Lab 06/01/15 1540 06/02/15 0250 06/03/15 0253  NA 140 140 142  K 3.7 3.5 3.7  CL 108 106 106  CO2 BUN CREATININE 1.32* 1.24* 1.18*  CALCIUM 9.3 9.1 9.2  PROT 7.0  --   --   BILITOT 1.7*  --   --   ALKPHOS 46  --   --   ALT 19  --   --   AST 24  --   --   GLUCOSE 109* 107* 96    Trops Neg x 3 EKG - ST depression precordial leads.  FOBT - negative.   Imaging/Diagnostic Tests:  5/13 ECHO Study Conclusions  - Left ventricle: The cavity size was normal. Wall thickness was  increased in a pattern of mild LVH. Systolic function was normal.  The estimated ejection fraction was in the range of 50% to 55%.  Possible mild hypokinesis of the inferior myocardium. - Aortic valve: There was moderate stenosis. There was trivial  regurgitation. Valve area (VTI): 1.46 cm^2. Valve area (Vmax):  1.36 cm^2. Valve area (Vmean): 1.4 cm^2. - Mitral valve: A mechanical prosthesis was present and functioning  normally. Valve area by continuity equation (using LVOT flow):  1.39 cm^2. - Left atrium: The atrium was severely dilated. - Right ventricle: The cavity size was mildly dilated. Systolic  function was reduced. - Right atrium: The atrium was severely dilated. - Pulmonary arteries: Systolic pressure was moderately increased.  PA peak pressure: 59 mm Hg (S).   5/13 Myoview Stress Test IMPRESSION: 1. No definite inducible or reversible ischemia with pharmacologic stress. Fixed inferior wall defect versus attenuation artifact. 2. Normal left ventricular wall motion. 3. Left ventricular ejection fraction 62% 4. Low-risk stress test findings  Beaulah Dinning, MD 06/04/2015, 7:05 AM PGY-1, [facility] Family Medicine FPTS Intern pager: [phone], text pages welcome

## 2015-06-04 NOTE — Progress Notes (Signed)
ANTICOAGULATION CONSULT NOTE  Pharmacy Consult for Heparin + warfarin Indication: Mitral valve replacement, possible PE   Allergies  Allergen Reactions  . Esomeprazole Magnesium     REACTION: stomach upset, nausea    Patient Measurements: Height: 5' 7.5" (171.5 cm) Weight: 185 lb (83.915 kg) IBW/kg (Calculated) : 62.75 Heparin Dosing Weight: 78 kg   Vital Signs: Temp: 98 F (36.7 C) (05/15 0346) Temp Source: Oral (05/15 0346) BP: 142/75 mmHg (05/15 1008) Pulse Rate: 67 (05/15 1008)  Labs:  Recent Labs  06/01/15 1540 06/01/15 2132 06/01/15 2332 06/02/15 0250 06/02/15 1154  06/02/15 2043 06/03/15 0253 06/03/15 1513 06/04/15 0216  HGB 8.5*  --  7.8* 8.5*  --   --   --  8.4*  --  8.5*  HCT 27.0*  --  25.5* 26.9*  --   --   --  27.1*  --  27.0*  PLT 294  --   --  299  --   --   --  268  --  338  LABPROT 21.3*  --   --   --  20.3*  --   --   --  21.0* 22.1*  INR 1.86*  --   --   --  1.74*  --   --   --  1.82* 1.94*  HEPARINUNFRC  --   --   --   --   --   < > 0.41 0.40  --  0.69  CREATININE 1.32*  --   --  1.24*  --   --   --  1.18*  --   --   TROPONINI  --  0.03 0.03 0.03  --   --   --   --   --   --   < > = values in this interval not displayed.  Estimated Creatinine Clearance: 44.9 mL/min (by C-G formula based on Cr of 1.18).   Assessment: 80 yo woman admitted 06/01/2015 for CP. D dimer 4.9. Pharmacy consulted to dose heparin and warfarin for concerns of PE. PMH St Jude mech MVR (1987), chronic afib, HTN,GERD, anemia, recent tibial plateau fx, CKD III.  Heparin level remains therapeutic but is at the upper end of the goal range. INR is trending up but remains subtherapeutic.  INR on admit subtherapeutic at 1.86, currently 1.82  Hgb 8.4, stable over this admission, but down from 10.8 last admission earlier this month. Plt wnl- no bleeding noted, hemoccult negative.  Goal of Therapy:  Heparin level 0.3-0.7 units/ml INR 2.5-3.5 Monitor platelets by  anticoagulation protocol: Yes   Plan:  - Reduce heparin gtt to 1150 units/hr - Repeat warfarin 10mg  PO x 1 tonight - Daily heparin level, INR and CBC  Lysle Pearlachel Azriella Mattia, PharmD, BCPS Pager # [phone] 06/04/2015 11:37 AM

## 2015-06-04 NOTE — Evaluation (Signed)
Occupational Therapy Evaluation Patient Details Name: [name] MRN: [id number] DOB: [birth year] Today's Date: 06/04/2015    History of Present Illness Patient is a 80 y/o female with hx of chronic A-fib, GERD, osteoporosis, s/p MVR, chronic anticoagulation, RBBB, CKD III, recent left tibial plateau fx (conservative management, NWB and KI) presents with chest pain. EKG with inferior lead depression. Admitted from [facility].   Clinical Impression   Pt presents with generalized weakness, impaired memory and safety awareness and poor standing balance. She experienced one LOB upon return to bed with RW requiring min assist to regain balance. Pt educated and provided AE for LB ADL. Pt is eager to go home, needs 24 hour care of her family which she says will be provided.    Follow Up Recommendations  Home health OT;Supervision/Assistance - 24 hour (home health aide)    Equipment Recommendations  None recommended by OT    Recommendations for Other Services       Precautions / Restrictions Precautions Precautions: Fall Required Braces or Orthoses: Knee Immobilizer - Left Knee Immobilizer - Left: On at all times Restrictions Weight Bearing Restrictions: Yes LLE Weight Bearing: Non weight bearing Other Position/Activity Restrictions: Precautions taken from prior admission ~1 week ago.      Mobility Bed Mobility Overal bed mobility: Modified Independent Bed Mobility: Supine to Sit           General bed mobility comments: . Use of rail for support.   Transfers Overall transfer level: Needs assistance Equipment used: Rolling walker (2 wheeled) Transfers: Sit to/from Stand Sit to Stand: Min guard         General transfer comment: Min guard to boost from EOB with cues for hand placement. Good adherence to NWB status.    Balance     Sitting balance-Leahy Scale: Good       Standing balance-Leahy Scale: Poor                              ADL Overall  ADL's : Needs assistance/impaired Eating/Feeding: Independent;Sitting   Grooming: Oral care;Standing;Minimal assistance   Upper Body Bathing: Set up;Sitting   Lower Body Bathing: Sitting/lateral leans;With adaptive equipment;Minimal assistance   Upper Body Dressing : Set up;Sitting   Lower Body Dressing: Moderate assistance;Sit to/from stand;With adaptive equipment   Toilet Transfer: Minimal assistance;RW;Ambulation;BSC   Toileting- Clothing Manipulation and Hygiene: Minimal assistance;Sit to/from stand;Sitting/lateral lean       Functional mobility during ADLs: Minimal assistance;Rolling walker General ADL Comments: Issued adaptive equipment bag. MD present at end of session to answer questions about care of hematoma on L LE.     Vision     Perception     Praxis      Pertinent Vitals/Pain Pain Assessment: No/denies pain     Hand Dominance Right   Extremity/Trunk Assessment Upper Extremity Assessment Upper Extremity Assessment: Overall WFL for tasks assessed   Lower Extremity Assessment Lower Extremity Assessment: Defer to PT evaluation       Communication Communication Communication: No difficulties   Cognition Arousal/Alertness: Awake/alert Behavior During Therapy: WFL for tasks assessed/performed Overall Cognitive Status: Impaired/Different from baseline Area of Impairment: Safety/judgement;Memory     Memory: Decreased short-term memory;Decreased recall of precautions   Safety/Judgement: Decreased awareness of safety         General Comments       Exercises       Shoulder Instructions      Home Living Family/patient  expects to be discharged to:: Private residence Living Arrangements: Alone Available Help at Discharge: Family;Friend(s);Available PRN/intermittently Type of Home: Apartment Home Access: Level entry     Home Layout: One level     Bathroom Shower/Tub: Chief Strategy OfficerTub/shower unit   Bathroom Toilet: Standard     Home Equipment:  Environmental consultantWalker - 2 wheels;Bedside commode;Wheelchair - manual;Tub bench;Grab bars - tub/shower          Prior Functioning/Environment Level of Independence: Needs assistance  Gait / Transfers Assistance Needed: Uses RW for ambulation due to NWB LLE. ADL's / Homemaking Assistance Needed: Has required assist [facility].   Comments: daughter and neighbors will assist    OT Diagnosis: Generalized weakness;Cognitive deficits   OT Problem List: Decreased strength;Decreased activity tolerance;Impaired balance (sitting and/or standing);Decreased knowledge of use of DME or AE;Decreased safety awareness;Decreased cognition;Pain   OT Treatment/Interventions:      OT Goals(Current goals can be found in the care plan section) Acute Rehab OT Goals Patient Stated Goal: to go home with family assist  OT Frequency:     Barriers to D/C:            Co-evaluation              End of Session Equipment Utilized During Treatment: Gait belt;Rolling walker;Left knee immobilizer  Activity Tolerance: Patient tolerated treatment well Patient left: in bed;with call bell/phone within reach (with MD)   Time: 1315-1410 OT Time Calculation (min): 55 min Charges:  OT General Charges $OT Visit: 1 Procedure OT Evaluation $OT Eval Moderate Complexity: 1 Procedure OT Treatments $Self Care/Home Management : 38-52 mins G-Codes:    Evern BioMayberry, Obbie Lewallen Lynn 06/04/2015, 2:16 PM  [phone]

## 2015-06-04 NOTE — Care Management Note (Addendum)
Case Management Note Donn PieriniKristi Bengie Kaucher RN, BSN Unit 2W-Case Manager [phone]  Patient Details  Name: Tami MatterSally M Cahoon MRN: [id number] Date of Birth: [birth year]  Subjective/Objective:   Pt admitted with Chest pain                 Action/Plan: [facility]- per pt she does not [facility] to [facility] and would prefer instead to return [facility] with [facility] and [facility]- spoke with pt at bedside- discussed discharge needs- pt states that she has RW, BSC, tub bench and w/c at home- choice offered for [facility] [place]- per pt [facility] in the past and states that she would like to use them again. Referral [facility] for HH-RN/PT/OT/aide-  Pt request daily nursing visits for lovenox shots- explained to pt that [facility] will educate her and family on this that they do not usually do daily visits for this.   Addendum:1515-  [facility]-that they would not be able to take referral due to staffing - went back to pt room and informed [facility] that [facility]  and offered choice again -pt [facility] that [facility]- [facility]Marie [facility] -referral accepted for HH-RN/PT/OT/aide-   Expected Discharge Date:     06/04/15             Expected Discharge Plan:  Home w Home Health Services  In-House Referral:  Clinical Social Work  Discharge planning Services  CM Consult  Post Acute Care Choice:  Home Health Choice offered to:  Patient  DME Arranged:    DME Agency:     HH Arranged:  RN, PT, OT, Nurse's Aide HH Agency:  [facility]  Status of Service:  Completed, signed off  Medicare Important Message Given:    Date Medicare IM Given:    Medicare IM give by:    Date Additional Medicare IM Given:    Additional Medicare Important Message give by:     If discussed at Long Length of Stay Meetings, dates discussed:     Additional Comments:  Darrold SpanWebster, [place] Hazan Hall, RN 06/04/2015, 2:57 PM

## 2015-06-04 NOTE — Discharge Instructions (Signed)
We are glad that you are feeling better.   It is important that you continue to take your Lovenox shots twice a day until you follow up to get your INR checked.   If you develop any return of your chest pain or shortness of breath, then return to see [place].   If you have any worsening pain in your leg or note any bleeding, then please let us know.   Thanks for letting us take care of you.   Sincerely, Devota Pace, MD Family Medicine - PGY 2  How and Where to Give Subcutaneous Enoxaparin Injections Enoxaparin is an injectable medicine. It is used to help prevent blood clots from developing in your veins. Health care providers often use anticoagulants like enoxaparin to prevent clots following surgery. Enoxaparin is also used in combination with other medicines to treat blood clots and heart attacks. If blood clots are left untreated, they can be life threatening.  Enoxaparin comes in single-use syringes. You inject enoxaparin through a syringe into your belly (abdomen). You should change the injection site each time you give yourself a shot. Continue the enoxaparin injections as directed by your health care provider. Your health care provider will use blood clotting test results to decide when you can safely stop using enoxaparin injections. If your health care provider prescribes any additional medicines, use the medicines exactly as directed. HOW DO I INJECT ENOXAPARIN?  1. Wash your hands with soap and water. 2. Clean the selected injection site as directed by your health care provider. 3. Remove the needle cap by pulling it straight off the syringe. 4. Hold the syringe like a pencil using your writing hand. 5. Use your other hand to pinch and hold an inch of the cleansed skin. 6. Insert the entire needle straight down into the fold of skin. 7. Push the plunger with your thumb until the syringe is empty. 8. Pull the needle straight out of your skin. 9. Enoxaparin injection prefilled  syringes and graduated prefilled syringes are available with a system that shields the needle after injection. After you have completed your injection and removed the needle from your skin, firmly push down on the plunger. The protective sleeve will automatically cover the needle and you will hear a click. The click means the needle is safely covered. 10. Place the syringe in the nearest needle box, also called a sharps container. If you do not have a sharps container, you can use a hard-sided plastic container with a secure lid, such as an empty laundry detergent bottle. WHAT ELSE DO I NEED TO KNOW?  Do not use enoxaparin if:  You have allergies to heparin or pork products.  You have been diagnosed with a condition called thrombocytopenia.  Do not use the syringe or needle more than one time.  Use medicines only as directed by your health care provider.  Changes in medicines, supplements, diet, and illness can affect your anticoagulation therapy. Be sure to inform your health care provider of any of these changes.  It is important that you tell all of your health care providers and your dentist that you are taking an anticoagulant, especially if you are injured or plan to have any type of procedure.  While on anticoagulants, you will need to have blood tests done routinely as directed by your health care provider.  While using this medicine, avoid physical activities or sports that could result in a fall or cause injury.  Follow up with your laboratory test and health  care provider appointments as directed. It is very important to keep your appointments. Not keeping appointments could result in a chronic or permanent injury, pain, or disability.  Before giving your medicine, you should make sure the injection is a clear and colorless or pale yellow solution. If your medicine becomes discolored or if there are particles in the syringe, do not use it and notify your health care  provider.  Keep your medicine safely stored at room temperature. SEEK MEDICAL CARE IF:  You develop any rashes on your skin.  You have large areas of bruising on your skin.  You have any worsening of the condition for which you take Enoxaparin.  You develop a fever. SEEK IMMEDIATE MEDICAL CARE IF:  You develop bleeding problems such as:  Bleeding from the gums or nose that does not stop quickly.  Vomiting blood or coughing up blood.  Blood in your urine.  Blood in your stool, or stool that has a dark, tarry, or coffee grounds appearance.  A cut that does not stop bleeding within 10 minutes. These symptoms may represent a serious problem that is an emergency. Do not wait to see if the symptoms will go away. Get medical help right away. Call your local emergency services (911 in the [place] Do not drive yourself to the hospital.    This information is not intended to replace advice given to you by your health care provider. Make sure you discuss any questions you have with your health care provider.   Document Released: 11/08/2003 Document Revised: 01/27/2014 Document Reviewed: 06/23/2013 Elsevier Interactive Patient Education [employer]2016 Elsevier Inc.

## 2015-06-04 NOTE — Progress Notes (Signed)
Physical Therapy Treatment Patient Details Name: Tami MatterSally M Siegrist MRN: [id number] DOB: October 21, 1935 Today's Date: 06/04/2015    History of Present Illness Patient is a 80 y/o female with hx of chronic A-fib, GERD, osteoporosis, s/p MVR, chronic anticoagulation, RBBB, CKD III, recent left tibial plateau fx (conservative management, NWB and KI) presents with chest pain. EKG with inferior lead depression. [facility].    PT Comments    Improved activity tolerance and safety with mobility.  Pt ambulated 35' x 2 with RW, no loss of balance, but with close supervision for safety. She still would benefit from 24* supervision for safety. She reports she's worked out having more help at home from her family and wants to have a home health aide. PT now recommending HHPT rather than SNF.    Follow Up Recommendations  Supervision for mobility/OOB;Home health PT     Equipment Recommendations  None recommended by PT    Recommendations for Other Services OT consult     Precautions / Restrictions Precautions Precautions: Fall Required Braces or Orthoses: Knee Immobilizer - Left Knee Immobilizer - Left: On at all times Restrictions Weight Bearing Restrictions: Yes LLE Weight Bearing: Non weight bearing Other Position/Activity Restrictions: Precautions taken from prior admission ~1 week ago.    Mobility  Bed Mobility Overal bed mobility: Needs Assistance Bed Mobility: Supine to Sit     Supine to sit: Modified independent (Device/Increase time);HOB elevated     General bed mobility comments: . Use of rail for support.   Transfers Overall transfer level: Needs assistance Equipment used: Rolling walker (2 wheeled) Transfers: Sit to/from Stand Sit to Stand: Min guard         General transfer comment: Min guard to boost from EOB with cues for hand placement. Good adherence to NWB status.  Ambulation/Gait Ambulation/Gait assistance: Min guard Ambulation Distance (Feet):  70 Feet Assistive device: Rolling walker (2 wheeled) Gait Pattern/deviations: Step-through pattern Gait velocity: decreased Gait velocity interpretation: Below normal speed for age/gender General Gait Details: good adherence to NWB status, no LOB, 35' x 2 with seated rest break, pt denied pain; SaO2 98% on RA, HR 88 walking   Stairs            Wheelchair Mobility    Modified Rankin (Stroke Patients Only)       Balance     Sitting balance-Leahy Scale: Good       Standing balance-Leahy Scale: Poor                      Cognition Arousal/Alertness: Awake/alert Behavior During Therapy: WFL for tasks assessed/performed Overall Cognitive Status: Within Functional Limits for tasks assessed                      Exercises      General Comments        Pertinent Vitals/Pain Pain Assessment: No/denies pain    Home Living                      Prior Function            PT Goals (current goals can now be found in the care plan section) Acute Rehab PT Goals Patient Stated Goal: to go home with family assist PT Goal Formulation: With patient Time For Goal Achievement: 06/17/15 Potential to Achieve Goals: Good Progress towards PT goals: Progressing toward goals    Frequency  Min 3X/week    PT Plan Current  plan remains appropriate    Co-evaluation             End of Session Equipment Utilized During Treatment: Gait belt;Left knee immobilizer Activity Tolerance: Patient tolerated treatment well Patient left: in chair;with call bell/phone within reach     Time: 0940-1004 PT Time Calculation (min) (ACUTE ONLY): 24 min  Charges:  $Gait Training: 8-22 mins $Therapeutic Activity: 8-22 mins                    G Codes:      Tamala Ser 06/04/2015, 10:12 AM [phone]

## 2015-06-04 NOTE — Clinical Social Work Note (Signed)
CSW met with patient. Patient refused short term rehab. CSW made  RNCM aware. CSW signing off.   [employer], LCSW [phone]

## 2015-06-05 ENCOUNTER — Telehealth: Payer: Self-pay | Admitting: Family Medicine

## 2015-06-05 DIAGNOSIS — S82142D Displaced bicondylar fracture of left tibia, subsequent encounter for closed fracture with routine healing: Secondary | ICD-10-CM

## 2015-06-05 NOTE — Telephone Encounter (Signed)
Need referral to Dr. Eulah PontMurphy orthopedic office.

## 2015-06-05 NOTE — Telephone Encounter (Signed)
Called patient and reminded her we would need to check her INR tomorrow and the [facility]. Patient aware that she can come in anytime. She stated her grandson would be able to take her.

## 2015-06-06 ENCOUNTER — Ambulatory Visit (INDEPENDENT_AMBULATORY_CARE_PROVIDER_SITE_OTHER): Payer: Medicare Other | Admitting: *Deleted

## 2015-06-06 DIAGNOSIS — I482 Chronic atrial fibrillation, unspecified: Secondary | ICD-10-CM

## 2015-06-06 DIAGNOSIS — Z952 Presence of prosthetic heart valve: Secondary | ICD-10-CM

## 2015-06-06 DIAGNOSIS — Z954 Presence of other heart-valve replacement: Secondary | ICD-10-CM | POA: Diagnosis not present

## 2015-06-06 DIAGNOSIS — Z7901 Long term (current) use of anticoagulants: Secondary | ICD-10-CM | POA: Diagnosis not present

## 2015-06-06 LAB — POCT INR: INR: 2.4

## 2015-06-07 DIAGNOSIS — Z9181 History of falling: Secondary | ICD-10-CM | POA: Diagnosis not present

## 2015-06-07 DIAGNOSIS — I129 Hypertensive chronic kidney disease with stage 1 through stage 4 chronic kidney disease, or unspecified chronic kidney disease: Secondary | ICD-10-CM | POA: Diagnosis not present

## 2015-06-07 DIAGNOSIS — S82142D Displaced bicondylar fracture of left tibia, subsequent encounter for closed fracture with routine healing: Secondary | ICD-10-CM | POA: Diagnosis not present

## 2015-06-07 DIAGNOSIS — D62 Acute posthemorrhagic anemia: Secondary | ICD-10-CM | POA: Diagnosis not present

## 2015-06-07 DIAGNOSIS — K219 Gastro-esophageal reflux disease without esophagitis: Secondary | ICD-10-CM | POA: Diagnosis not present

## 2015-06-07 DIAGNOSIS — Z7901 Long term (current) use of anticoagulants: Secondary | ICD-10-CM | POA: Diagnosis not present

## 2015-06-07 DIAGNOSIS — N183 Chronic kidney disease, stage 3 (moderate): Secondary | ICD-10-CM | POA: Diagnosis not present

## 2015-06-07 DIAGNOSIS — I482 Chronic atrial fibrillation: Secondary | ICD-10-CM | POA: Diagnosis not present

## 2015-06-08 ENCOUNTER — Other Ambulatory Visit (INDEPENDENT_AMBULATORY_CARE_PROVIDER_SITE_OTHER): Payer: Medicare Other

## 2015-06-08 DIAGNOSIS — Z954 Presence of other heart-valve replacement: Secondary | ICD-10-CM

## 2015-06-08 DIAGNOSIS — D62 Acute posthemorrhagic anemia: Secondary | ICD-10-CM | POA: Diagnosis not present

## 2015-06-08 DIAGNOSIS — K219 Gastro-esophageal reflux disease without esophagitis: Secondary | ICD-10-CM | POA: Diagnosis not present

## 2015-06-08 DIAGNOSIS — Z9181 History of falling: Secondary | ICD-10-CM | POA: Diagnosis not present

## 2015-06-08 DIAGNOSIS — S82142D Displaced bicondylar fracture of left tibia, subsequent encounter for closed fracture with routine healing: Secondary | ICD-10-CM | POA: Diagnosis not present

## 2015-06-08 DIAGNOSIS — Z7901 Long term (current) use of anticoagulants: Secondary | ICD-10-CM | POA: Diagnosis not present

## 2015-06-08 DIAGNOSIS — I482 Chronic atrial fibrillation, unspecified: Secondary | ICD-10-CM

## 2015-06-08 DIAGNOSIS — N183 Chronic kidney disease, stage 3 (moderate): Secondary | ICD-10-CM | POA: Diagnosis not present

## 2015-06-08 DIAGNOSIS — I129 Hypertensive chronic kidney disease with stage 1 through stage 4 chronic kidney disease, or unspecified chronic kidney disease: Secondary | ICD-10-CM | POA: Diagnosis not present

## 2015-06-08 DIAGNOSIS — Z952 Presence of prosthetic heart valve: Secondary | ICD-10-CM

## 2015-06-08 LAB — POCT INR: INR: 1.7

## 2015-06-11 ENCOUNTER — Encounter: Payer: Self-pay | Admitting: Family Medicine

## 2015-06-11 ENCOUNTER — Ambulatory Visit (INDEPENDENT_AMBULATORY_CARE_PROVIDER_SITE_OTHER): Payer: Medicare Other | Admitting: Family Medicine

## 2015-06-11 VITALS — BP 158/92 | HR 85 | Temp 98.1°F | Ht 67.5 in | Wt 171.5 lb

## 2015-06-11 DIAGNOSIS — D509 Iron deficiency anemia, unspecified: Secondary | ICD-10-CM

## 2015-06-11 DIAGNOSIS — I482 Chronic atrial fibrillation: Secondary | ICD-10-CM | POA: Diagnosis not present

## 2015-06-11 DIAGNOSIS — S82142D Displaced bicondylar fracture of left tibia, subsequent encounter for closed fracture with routine healing: Secondary | ICD-10-CM | POA: Diagnosis not present

## 2015-06-11 DIAGNOSIS — I10 Essential (primary) hypertension: Secondary | ICD-10-CM | POA: Diagnosis not present

## 2015-06-11 DIAGNOSIS — Z7901 Long term (current) use of anticoagulants: Secondary | ICD-10-CM | POA: Diagnosis not present

## 2015-06-11 DIAGNOSIS — D62 Acute posthemorrhagic anemia: Secondary | ICD-10-CM | POA: Diagnosis not present

## 2015-06-11 DIAGNOSIS — R072 Precordial pain: Secondary | ICD-10-CM

## 2015-06-11 DIAGNOSIS — Z9181 History of falling: Secondary | ICD-10-CM | POA: Diagnosis not present

## 2015-06-11 DIAGNOSIS — I129 Hypertensive chronic kidney disease with stage 1 through stage 4 chronic kidney disease, or unspecified chronic kidney disease: Secondary | ICD-10-CM | POA: Diagnosis not present

## 2015-06-11 DIAGNOSIS — N183 Chronic kidney disease, stage 3 (moderate): Secondary | ICD-10-CM | POA: Diagnosis not present

## 2015-06-11 DIAGNOSIS — K219 Gastro-esophageal reflux disease without esophagitis: Secondary | ICD-10-CM | POA: Diagnosis not present

## 2015-06-11 LAB — POCT HEMOGLOBIN: Hemoglobin: 8.8 g/dL — AB (ref 12.2–16.2)

## 2015-06-11 LAB — POCT INR: INR: 1.9

## 2015-06-11 NOTE — Patient Instructions (Addendum)
Thank you for coming in to clinic today.  1. I am glad that you are doing better since leaving the hospital - We will check your Coumadin level INR today and adjust your Coumadin dose - INR level 1.9 - Today increase Coumadin to 15mg  (ONE AND A HALF TABLET) then resume ONE tablet 10mg  every day until you see Cardiologist  - Also check blood count or Hemoglobin since yours has been low recently - After today please resume your normal Coumadin INR checks with your Cardiologist office  2. Follow-up with Dr Eulah PontMurphy on Wednesday for your Left leg fracture, seems to be healing well, he will determine if you can bear weight and stand on it yet, continue Home Health therapy as you are  Please schedule a follow-up appointment with Dr Jennette KettleNeal as needed within 4 weeks to follow-up Chronic Anticoagulation / Coumadin / Left Leg fracture  If you have any other questions or concerns, please feel free to call the clinic to contact me. You may also schedule an earlier appointment if necessary.  However, if your symptoms get significantly worse, please go to the Emergency Department to seek immediate medical attention.  Saralyn PilarAlexander Maleko Greulich, DO [facility]

## 2015-06-11 NOTE — Progress Notes (Signed)
Subjective:    Patient ID: [name], female    DOB: 21-Jan-1935, 80 y.o.   MRN: [id number]  [name] is a 80 y.o. female presenting on 06/11/2015 for Hospitalization Follow-up   History per patient and son.   HPI  Hospitalized from 5/12 to 06/04/15 for Chest Pain (rule out MI) previously [facility] at time of admission for short-term acute rehab for left tibial plateau fracture.  Chest Pain, Ruled out MI - Resolved - Patient presented through ED initially with atypical non exertional chest pain but given significant cardiac history sent to ED from SNF, thought to be related to some anxiety as well. Concern on initial EKG with ST depression in anterior leads, troponin negative, CP had resolved and rest of work-up unremarkable. Cardiology consulted and had a Myoview stress test (Low Risk)  Chronic Anticoagulation (chronic Afib / Mechanical Mitral Valve) / INR FOLLOW-UP - On admission was sub therapeutic INR, had recently been through several coumadin dose adjustments at SNF, started on Heparin in hospital with CP, then bridged with Lovenox back to Coumadin dosing. On Discharge given Lovenox 80mg  BID for still sup therapeutic INR. - Had INR check 5/17, there was confusion on coumadin dosing and patient did not take PO coumadin for about 3 days then resumed on Thursday through Sunday, had been taking 10mg  daily except Sunday 5mg . - Today INR re-check. Goal INR 2.5 to 3.5 - No problems with bleeding.   Left Tibial Plateau Fracture, closed, healing (non-operative) - See prior admit and nursing home note for further details on initial injury - Currently healing and doing well. Remains in knee immobilizer. [facility], seems to be doing well with this, follow-up apt with Dr Eulah PontMurphy orthopedics on Wednesday to follow-up, determine if can start weight bearing or not. - Improved pain, ROM - Reports hematoma is improved  CHRONIC HTN: Reports did not  take anti-HTN meds today Reports good compliance normally. Tolerating well, w/o complaints. Lifestyle - now limited exercise with leg fracture Denies CP, dyspnea, HA, edema, dizziness / lightheadedness   Social History  Substance Use Topics  . Smoking status: Former Games developermoker  . Smokeless tobacco: Never Used     Comment: quiit 1987  . Alcohol Use: No    Review of Systems Per HPI unless specifically indicated above     Objective:    BP 158/92 mmHg  Pulse 85  Temp(Src) 98.1 F (36.7 C) (Oral)  Ht 5' 7.5" (1.715 m)  Wt 171 lb 8 oz (77.792 kg)  BMI 26.45 kg/m2  Wt Readings from Last 3 Encounters:  06/11/15 171 lb 8 oz (77.792 kg)  06/01/15 185 lb (83.915 kg)  05/31/15 182 lb 12.8 oz (82.918 kg)    Physical Exam  Constitutional: She is oriented to person, place, and time. She appears well-developed and well-nourished. No distress.  Well-appearing, comfortable, some confusion but cooperative  HENT:  Head: Normocephalic and atraumatic.  Mouth/Throat: Oropharynx is clear and moist.  Cardiovascular: Normal rate and intact distal pulses.   Murmur (2/6 sternal border, stable) heard. Irregularly irregular, mechanical S1 closure  Pulmonary/Chest: Effort normal and breath sounds normal. No respiratory distress. She has no wheezes. She has no rales.  Musculoskeletal: Normal range of motion. She exhibits edema (trace lower ext edema L>R).  Left lower ext in Knee Immobilizer - patient declined to have this removed today. Non-tender through palpation of knee and below in region of fracture. Unable to evaluate hematoma progress,  per hospital discharge had improved significantly, ROM limited by knee immobilizer.  Unable to test gait or weight bearing due to Orthopedic restrictions, remains in wheelchair  Neurological: She is alert and oriented to person, place, and time.  Skin: Skin is warm and dry. No rash noted. She is not diaphoretic.  Psychiatric: Thought content normal.  Nursing note  and vitals reviewed.    Results for orders placed or performed in visit on 06/11/15  POCT hemoglobin  Result Value Ref Range   Hemoglobin 8.8 (A) 12.2 - 16.2 g/dL  POCT INR  Result Value Ref Range   INR 1.9       Assessment & Plan:   Problem List Items Addressed This Visit    Tibial plateau fracture    Stable, seems to be healing appropriately, non-operative. Continue Knee immobilizer Continue HH PT as scheduled Follow-up 2 days with Dr Arlys John - to determine if can start weight bearing Left lower ext      RESOLVED: Substernal chest pain    Resolved. Ruled out MI. Myoview low risk      Long term (current) use of anticoagulants - Primary    Recent subtherapeutic INR (likely with several missed coumadin doses due to confusion with hospital discharge), chronic anticoag goal INR 2.5 to 3.5 with chronic afib and mechanical valve (MVR) - INR normally followed by Cardiology office  Plan: 1. INR check today 1.9 (prior 1.6) - difficult to adjust given not even a full week of dosing to predict trend. Will increase dose up to  x 1 today, then resume  daily until Thursday when next INR check scheduled with Cardiology 2. Stop Lovenox      Relevant Orders   POCT INR (Completed)   HYPERTENSION, BENIGN SYSTEMIC    Initially elevated BP, today with some pain with leg, and did not take anti-HTN meds Complication known CKD-III, chronic AFib  Plan: 1. No change to anti-HTN meds today, re-check BP improved manually 2. Continue Metoprolol, Diltiazem      ANEMIA, IRON DEFICIENCY, UNSPEC.    Stable to improved Check POC Hgb today, results 8.8, did not feel CBC was indicated < 1 week from hospital stay, only following Hgb at this time, No further bleeding and hematoma improved Follow-up      Relevant Orders   POCT hemoglobin (Completed)      No orders of the defined types were placed in this encounter.      Follow up plan: Return in about 4 weeks (around 07/09/2015)  for chronic anticoag, Left tib fracture.  Saralyn Pilar, DO [facility], PGY-3

## 2015-06-12 DIAGNOSIS — Z9181 History of falling: Secondary | ICD-10-CM | POA: Diagnosis not present

## 2015-06-12 DIAGNOSIS — N183 Chronic kidney disease, stage 3 (moderate): Secondary | ICD-10-CM | POA: Diagnosis not present

## 2015-06-12 DIAGNOSIS — K219 Gastro-esophageal reflux disease without esophagitis: Secondary | ICD-10-CM | POA: Diagnosis not present

## 2015-06-12 DIAGNOSIS — I482 Chronic atrial fibrillation: Secondary | ICD-10-CM | POA: Diagnosis not present

## 2015-06-12 DIAGNOSIS — S82142D Displaced bicondylar fracture of left tibia, subsequent encounter for closed fracture with routine healing: Secondary | ICD-10-CM | POA: Diagnosis not present

## 2015-06-12 DIAGNOSIS — D62 Acute posthemorrhagic anemia: Secondary | ICD-10-CM | POA: Diagnosis not present

## 2015-06-12 DIAGNOSIS — Z7901 Long term (current) use of anticoagulants: Secondary | ICD-10-CM | POA: Diagnosis not present

## 2015-06-12 DIAGNOSIS — I129 Hypertensive chronic kidney disease with stage 1 through stage 4 chronic kidney disease, or unspecified chronic kidney disease: Secondary | ICD-10-CM | POA: Diagnosis not present

## 2015-06-12 NOTE — Assessment & Plan Note (Signed)
Stable, seems to be healing appropriately, non-operative. Continue Knee immobilizer Continue HH PT as scheduled Follow-up 2 days with Dr Arlys JohnMurphy Ortho - to determine if can start weight bearing Left lower ext

## 2015-06-12 NOTE — Assessment & Plan Note (Signed)
Initially elevated BP, today with some pain with leg, and did not take anti-HTN meds Complication known CKD-III, chronic AFib  Plan: 1. No change to anti-HTN meds today, re-check BP improved manually 2. Continue Metoprolol, Diltiazem

## 2015-06-12 NOTE — Assessment & Plan Note (Signed)
Resolved. Ruled out MI. Myoview low risk

## 2015-06-12 NOTE — Assessment & Plan Note (Signed)
Recent subtherapeutic INR (likely with several missed coumadin doses due to confusion with hospital discharge), chronic anticoag goal INR 2.5 to 3.5 with chronic afib and mechanical valve (MVR) - INR normally followed by Cardiology office  Plan: 1. INR check today 1.9 (prior 1.6) - difficult to adjust given not even a full week of dosing to predict trend. Will increase dose up to 15mg  x 1 today, then resume 10mg  daily until Thursday when next INR check scheduled with Cardiology 2. Stop Lovenox

## 2015-06-12 NOTE — Assessment & Plan Note (Signed)
Stable to improved Check POC Hgb today, results 8.8, did not feel CBC was indicated < 1 week from hospital stay, only following Hgb at this time, No further bleeding and hematoma improved Follow-up

## 2015-06-13 ENCOUNTER — Ambulatory Visit (INDEPENDENT_AMBULATORY_CARE_PROVIDER_SITE_OTHER): Payer: Medicare Other | Admitting: *Deleted

## 2015-06-13 DIAGNOSIS — S82145D Nondisplaced bicondylar fracture of left tibia, subsequent encounter for closed fracture with routine healing: Secondary | ICD-10-CM | POA: Diagnosis not present

## 2015-06-13 DIAGNOSIS — Z952 Presence of prosthetic heart valve: Secondary | ICD-10-CM

## 2015-06-13 DIAGNOSIS — Z5181 Encounter for therapeutic drug level monitoring: Secondary | ICD-10-CM | POA: Diagnosis not present

## 2015-06-13 DIAGNOSIS — I482 Chronic atrial fibrillation, unspecified: Secondary | ICD-10-CM

## 2015-06-13 DIAGNOSIS — Z7901 Long term (current) use of anticoagulants: Secondary | ICD-10-CM | POA: Diagnosis not present

## 2015-06-13 DIAGNOSIS — Z954 Presence of other heart-valve replacement: Secondary | ICD-10-CM | POA: Diagnosis not present

## 2015-06-13 DIAGNOSIS — I4891 Unspecified atrial fibrillation: Secondary | ICD-10-CM

## 2015-06-13 LAB — POCT INR: INR: 2.9

## 2015-06-14 DIAGNOSIS — I129 Hypertensive chronic kidney disease with stage 1 through stage 4 chronic kidney disease, or unspecified chronic kidney disease: Secondary | ICD-10-CM | POA: Diagnosis not present

## 2015-06-14 DIAGNOSIS — Z7901 Long term (current) use of anticoagulants: Secondary | ICD-10-CM | POA: Diagnosis not present

## 2015-06-14 DIAGNOSIS — I482 Chronic atrial fibrillation: Secondary | ICD-10-CM | POA: Diagnosis not present

## 2015-06-14 DIAGNOSIS — N183 Chronic kidney disease, stage 3 (moderate): Secondary | ICD-10-CM | POA: Diagnosis not present

## 2015-06-14 DIAGNOSIS — Z9181 History of falling: Secondary | ICD-10-CM | POA: Diagnosis not present

## 2015-06-14 DIAGNOSIS — D62 Acute posthemorrhagic anemia: Secondary | ICD-10-CM | POA: Diagnosis not present

## 2015-06-14 DIAGNOSIS — K219 Gastro-esophageal reflux disease without esophagitis: Secondary | ICD-10-CM | POA: Diagnosis not present

## 2015-06-14 DIAGNOSIS — S82142D Displaced bicondylar fracture of left tibia, subsequent encounter for closed fracture with routine healing: Secondary | ICD-10-CM | POA: Diagnosis not present

## 2015-06-15 DIAGNOSIS — Z7901 Long term (current) use of anticoagulants: Secondary | ICD-10-CM | POA: Diagnosis not present

## 2015-06-15 DIAGNOSIS — I129 Hypertensive chronic kidney disease with stage 1 through stage 4 chronic kidney disease, or unspecified chronic kidney disease: Secondary | ICD-10-CM | POA: Diagnosis not present

## 2015-06-15 DIAGNOSIS — I482 Chronic atrial fibrillation: Secondary | ICD-10-CM | POA: Diagnosis not present

## 2015-06-15 DIAGNOSIS — K219 Gastro-esophageal reflux disease without esophagitis: Secondary | ICD-10-CM | POA: Diagnosis not present

## 2015-06-15 DIAGNOSIS — N183 Chronic kidney disease, stage 3 (moderate): Secondary | ICD-10-CM | POA: Diagnosis not present

## 2015-06-15 DIAGNOSIS — Z9181 History of falling: Secondary | ICD-10-CM | POA: Diagnosis not present

## 2015-06-15 DIAGNOSIS — D62 Acute posthemorrhagic anemia: Secondary | ICD-10-CM | POA: Diagnosis not present

## 2015-06-15 DIAGNOSIS — S82142D Displaced bicondylar fracture of left tibia, subsequent encounter for closed fracture with routine healing: Secondary | ICD-10-CM | POA: Diagnosis not present

## 2015-06-18 DIAGNOSIS — I482 Chronic atrial fibrillation: Secondary | ICD-10-CM | POA: Diagnosis not present

## 2015-06-18 DIAGNOSIS — S82142D Displaced bicondylar fracture of left tibia, subsequent encounter for closed fracture with routine healing: Secondary | ICD-10-CM | POA: Diagnosis not present

## 2015-06-18 DIAGNOSIS — K219 Gastro-esophageal reflux disease without esophagitis: Secondary | ICD-10-CM | POA: Diagnosis not present

## 2015-06-18 DIAGNOSIS — I129 Hypertensive chronic kidney disease with stage 1 through stage 4 chronic kidney disease, or unspecified chronic kidney disease: Secondary | ICD-10-CM | POA: Diagnosis not present

## 2015-06-18 DIAGNOSIS — Z7901 Long term (current) use of anticoagulants: Secondary | ICD-10-CM | POA: Diagnosis not present

## 2015-06-18 DIAGNOSIS — Z9181 History of falling: Secondary | ICD-10-CM | POA: Diagnosis not present

## 2015-06-18 DIAGNOSIS — D62 Acute posthemorrhagic anemia: Secondary | ICD-10-CM | POA: Diagnosis not present

## 2015-06-18 DIAGNOSIS — N183 Chronic kidney disease, stage 3 (moderate): Secondary | ICD-10-CM | POA: Diagnosis not present

## 2015-06-19 ENCOUNTER — Telehealth: Payer: Self-pay | Admitting: *Deleted

## 2015-06-19 DIAGNOSIS — K219 Gastro-esophageal reflux disease without esophagitis: Secondary | ICD-10-CM | POA: Diagnosis not present

## 2015-06-19 DIAGNOSIS — Z9181 History of falling: Secondary | ICD-10-CM | POA: Diagnosis not present

## 2015-06-19 DIAGNOSIS — D62 Acute posthemorrhagic anemia: Secondary | ICD-10-CM | POA: Diagnosis not present

## 2015-06-19 DIAGNOSIS — Z7901 Long term (current) use of anticoagulants: Secondary | ICD-10-CM | POA: Diagnosis not present

## 2015-06-19 DIAGNOSIS — N183 Chronic kidney disease, stage 3 (moderate): Secondary | ICD-10-CM | POA: Diagnosis not present

## 2015-06-19 DIAGNOSIS — I482 Chronic atrial fibrillation: Secondary | ICD-10-CM | POA: Diagnosis not present

## 2015-06-19 DIAGNOSIS — I129 Hypertensive chronic kidney disease with stage 1 through stage 4 chronic kidney disease, or unspecified chronic kidney disease: Secondary | ICD-10-CM | POA: Diagnosis not present

## 2015-06-19 DIAGNOSIS — S82142D Displaced bicondylar fracture of left tibia, subsequent encounter for closed fracture with routine healing: Secondary | ICD-10-CM | POA: Diagnosis not present

## 2015-06-19 NOTE — Telephone Encounter (Signed)
Called and discussed. Knee is a bit more swollen and more painful than last week.  No redness or fever.  Still waiting on Dr. Greig RightMurphy's office to arrange wound center visit.  I was not clear on the reason for the wound center.   Asked for nurse to ask patient to arrange an appointment at our office this week.  One month post fracture, she should be getting better.  He will relay the message.

## 2015-06-19 NOTE — Telephone Encounter (Signed)
[facility] stating that patient was recently discharged with a left tibial fracture and today patient is having increased pain and swelling in that area. Patient has called ortho office (Dr. Eulah PontMurphy) and hasnt heard anything back, also hasnt heard from wound care. Nusrse calling to find out how patient should proceed. He can be reached at [phone]

## 2015-06-20 ENCOUNTER — Ambulatory Visit (INDEPENDENT_AMBULATORY_CARE_PROVIDER_SITE_OTHER): Payer: Medicare Other | Admitting: *Deleted

## 2015-06-20 DIAGNOSIS — Z7901 Long term (current) use of anticoagulants: Secondary | ICD-10-CM

## 2015-06-20 DIAGNOSIS — I482 Chronic atrial fibrillation, unspecified: Secondary | ICD-10-CM

## 2015-06-20 DIAGNOSIS — Z952 Presence of prosthetic heart valve: Secondary | ICD-10-CM

## 2015-06-20 DIAGNOSIS — Z5181 Encounter for therapeutic drug level monitoring: Secondary | ICD-10-CM

## 2015-06-20 DIAGNOSIS — Z954 Presence of other heart-valve replacement: Secondary | ICD-10-CM | POA: Diagnosis not present

## 2015-06-20 DIAGNOSIS — I4891 Unspecified atrial fibrillation: Secondary | ICD-10-CM

## 2015-06-20 LAB — POCT INR: INR: 2.8

## 2015-06-20 MED ORDER — WARFARIN SODIUM 10 MG PO TABS: ORAL_TABLET | ORAL | Status: DC

## 2015-06-21 DIAGNOSIS — Z7901 Long term (current) use of anticoagulants: Secondary | ICD-10-CM | POA: Diagnosis not present

## 2015-06-21 DIAGNOSIS — S82142D Displaced bicondylar fracture of left tibia, subsequent encounter for closed fracture with routine healing: Secondary | ICD-10-CM | POA: Diagnosis not present

## 2015-06-21 DIAGNOSIS — N183 Chronic kidney disease, stage 3 (moderate): Secondary | ICD-10-CM | POA: Diagnosis not present

## 2015-06-21 DIAGNOSIS — Z9181 History of falling: Secondary | ICD-10-CM | POA: Diagnosis not present

## 2015-06-21 DIAGNOSIS — I129 Hypertensive chronic kidney disease with stage 1 through stage 4 chronic kidney disease, or unspecified chronic kidney disease: Secondary | ICD-10-CM | POA: Diagnosis not present

## 2015-06-21 DIAGNOSIS — D62 Acute posthemorrhagic anemia: Secondary | ICD-10-CM | POA: Diagnosis not present

## 2015-06-21 DIAGNOSIS — K219 Gastro-esophageal reflux disease without esophagitis: Secondary | ICD-10-CM | POA: Diagnosis not present

## 2015-06-21 DIAGNOSIS — I482 Chronic atrial fibrillation: Secondary | ICD-10-CM | POA: Diagnosis not present

## 2015-06-22 DIAGNOSIS — Z9181 History of falling: Secondary | ICD-10-CM | POA: Diagnosis not present

## 2015-06-22 DIAGNOSIS — S82142D Displaced bicondylar fracture of left tibia, subsequent encounter for closed fracture with routine healing: Secondary | ICD-10-CM | POA: Diagnosis not present

## 2015-06-22 DIAGNOSIS — I482 Chronic atrial fibrillation: Secondary | ICD-10-CM | POA: Diagnosis not present

## 2015-06-22 DIAGNOSIS — Z7901 Long term (current) use of anticoagulants: Secondary | ICD-10-CM | POA: Diagnosis not present

## 2015-06-22 DIAGNOSIS — I129 Hypertensive chronic kidney disease with stage 1 through stage 4 chronic kidney disease, or unspecified chronic kidney disease: Secondary | ICD-10-CM | POA: Diagnosis not present

## 2015-06-22 DIAGNOSIS — N183 Chronic kidney disease, stage 3 (moderate): Secondary | ICD-10-CM | POA: Diagnosis not present

## 2015-06-22 DIAGNOSIS — K219 Gastro-esophageal reflux disease without esophagitis: Secondary | ICD-10-CM | POA: Diagnosis not present

## 2015-06-22 DIAGNOSIS — D62 Acute posthemorrhagic anemia: Secondary | ICD-10-CM | POA: Diagnosis not present

## 2015-06-25 DIAGNOSIS — I129 Hypertensive chronic kidney disease with stage 1 through stage 4 chronic kidney disease, or unspecified chronic kidney disease: Secondary | ICD-10-CM | POA: Diagnosis not present

## 2015-06-25 DIAGNOSIS — S82111A Displaced fracture of right tibial spine, initial encounter for closed fracture: Secondary | ICD-10-CM | POA: Diagnosis not present

## 2015-06-25 DIAGNOSIS — K219 Gastro-esophageal reflux disease without esophagitis: Secondary | ICD-10-CM | POA: Diagnosis not present

## 2015-06-25 DIAGNOSIS — D62 Acute posthemorrhagic anemia: Secondary | ICD-10-CM | POA: Diagnosis not present

## 2015-06-25 DIAGNOSIS — N183 Chronic kidney disease, stage 3 (moderate): Secondary | ICD-10-CM | POA: Diagnosis not present

## 2015-06-25 DIAGNOSIS — I482 Chronic atrial fibrillation: Secondary | ICD-10-CM | POA: Diagnosis not present

## 2015-06-25 DIAGNOSIS — S82142D Displaced bicondylar fracture of left tibia, subsequent encounter for closed fracture with routine healing: Secondary | ICD-10-CM | POA: Diagnosis not present

## 2015-06-25 DIAGNOSIS — Z9181 History of falling: Secondary | ICD-10-CM | POA: Diagnosis not present

## 2015-06-25 DIAGNOSIS — Z7901 Long term (current) use of anticoagulants: Secondary | ICD-10-CM | POA: Diagnosis not present

## 2015-06-27 ENCOUNTER — Encounter: Payer: Self-pay | Admitting: Family Medicine

## 2015-06-27 ENCOUNTER — Ambulatory Visit (INDEPENDENT_AMBULATORY_CARE_PROVIDER_SITE_OTHER): Payer: Medicare Other | Admitting: Family Medicine

## 2015-06-27 VITALS — BP 184/92 | HR 89 | Temp 97.6°F | Ht 67.5 in | Wt 167.0 lb

## 2015-06-27 DIAGNOSIS — I482 Chronic atrial fibrillation: Secondary | ICD-10-CM | POA: Diagnosis not present

## 2015-06-27 DIAGNOSIS — S82142D Displaced bicondylar fracture of left tibia, subsequent encounter for closed fracture with routine healing: Secondary | ICD-10-CM | POA: Diagnosis not present

## 2015-06-27 DIAGNOSIS — Z7289 Other problems related to lifestyle: Secondary | ICD-10-CM | POA: Diagnosis not present

## 2015-06-27 DIAGNOSIS — D62 Acute posthemorrhagic anemia: Secondary | ICD-10-CM | POA: Diagnosis not present

## 2015-06-27 DIAGNOSIS — S82142E Displaced bicondylar fracture of left tibia, subsequent encounter for open fracture type I or II with routine healing: Secondary | ICD-10-CM

## 2015-06-27 DIAGNOSIS — Z609 Problem related to social environment, unspecified: Secondary | ICD-10-CM | POA: Insufficient documentation

## 2015-06-27 DIAGNOSIS — L97929 Non-pressure chronic ulcer of unspecified part of left lower leg with unspecified severity: Secondary | ICD-10-CM | POA: Diagnosis not present

## 2015-06-27 DIAGNOSIS — I10 Essential (primary) hypertension: Secondary | ICD-10-CM | POA: Diagnosis not present

## 2015-06-27 DIAGNOSIS — I129 Hypertensive chronic kidney disease with stage 1 through stage 4 chronic kidney disease, or unspecified chronic kidney disease: Secondary | ICD-10-CM | POA: Diagnosis not present

## 2015-06-27 NOTE — Assessment & Plan Note (Signed)
Traumatic from initial injury.  Poor healing.  Refer to wound care.

## 2015-06-27 NOTE — Assessment & Plan Note (Signed)
Lives alone.  Limited mobility.  Recent fall.  Will need PT once out of knee immobilizer.

## 2015-06-27 NOTE — Assessment & Plan Note (Signed)
Progressive wt bearing per ortho.  Will need PT again when out of knee immobilizer.

## 2015-06-27 NOTE — Assessment & Plan Note (Signed)
Unclear control.  Return 2-3 weeks for recheck on meds.

## 2015-06-27 NOTE — Patient Instructions (Signed)
OK to take tylenol 650 for pain.  Please check your medications and make sure you are not still taking oxycodone, which is a potent pain med. Talk to the orthopedist about how to do progressive weight bearing and how you can work with physical therapy to get your flexibility back. You will need more physical therapy once the knee immobilizer comes off. My nurse should set you up with the wound center.

## 2015-06-27 NOTE — Progress Notes (Signed)
   Subjective:    Patient ID: [name], female    DOB: 11-16-1935, 80 y.o.   MRN: [id number]  HPI [name] comes in for several issues: 1. S/P tibial plateau fx Left on 5/5.  No surg.  Followed by ortho.  Ortho will see again 6/14.  Mostly using wheelchair and some walker.  Continues in knee immobilizer.  She has had home PT and is now exercising on her own.  Wt bearing is now limited to touchdown.  Pain control OK. 2. Large skin ulcer/breakdown/old abrasion on left knee dating from her injury.  Ortho indicated that they would refer to wound care.  For whatever reason, that referral has not happened.  She is doing daily dressing changes.  No recent worsening, redness or fever. 3. Hypertension.  Did not take BP meds today.   4. Lives alone.  Daughter checks in on her.    Review of Systems     Objective:   Physical Exam Elevated BP noted Lungs clear Cardiac normal rate, no murmur Ext Left leg has diffuse swelling. (note, on anticoag for a fib and valve.  Therapeutic INR on 5/31.) 5 cm ulcer over anterolateral leg just distal to knee.  Eschar.  No redness or drainage.         Assessment & Plan:

## 2015-07-02 DIAGNOSIS — H25813 Combined forms of age-related cataract, bilateral: Secondary | ICD-10-CM | POA: Diagnosis not present

## 2015-07-04 ENCOUNTER — Ambulatory Visit (INDEPENDENT_AMBULATORY_CARE_PROVIDER_SITE_OTHER): Payer: Medicare Other | Admitting: *Deleted

## 2015-07-04 DIAGNOSIS — I482 Chronic atrial fibrillation, unspecified: Secondary | ICD-10-CM

## 2015-07-04 DIAGNOSIS — Z7901 Long term (current) use of anticoagulants: Secondary | ICD-10-CM | POA: Diagnosis not present

## 2015-07-04 DIAGNOSIS — Z5181 Encounter for therapeutic drug level monitoring: Secondary | ICD-10-CM | POA: Diagnosis not present

## 2015-07-04 DIAGNOSIS — I4891 Unspecified atrial fibrillation: Secondary | ICD-10-CM

## 2015-07-04 DIAGNOSIS — Z954 Presence of other heart-valve replacement: Secondary | ICD-10-CM | POA: Diagnosis not present

## 2015-07-04 DIAGNOSIS — S82145D Nondisplaced bicondylar fracture of left tibia, subsequent encounter for closed fracture with routine healing: Secondary | ICD-10-CM | POA: Diagnosis not present

## 2015-07-04 DIAGNOSIS — Z952 Presence of prosthetic heart valve: Secondary | ICD-10-CM

## 2015-07-04 LAB — POCT INR: INR: 3.9

## 2015-07-05 DIAGNOSIS — Z7901 Long term (current) use of anticoagulants: Secondary | ICD-10-CM | POA: Diagnosis not present

## 2015-07-05 DIAGNOSIS — N183 Chronic kidney disease, stage 3 (moderate): Secondary | ICD-10-CM | POA: Diagnosis not present

## 2015-07-05 DIAGNOSIS — I482 Chronic atrial fibrillation: Secondary | ICD-10-CM | POA: Diagnosis not present

## 2015-07-05 DIAGNOSIS — Z9181 History of falling: Secondary | ICD-10-CM | POA: Diagnosis not present

## 2015-07-05 DIAGNOSIS — D62 Acute posthemorrhagic anemia: Secondary | ICD-10-CM | POA: Diagnosis not present

## 2015-07-05 DIAGNOSIS — I129 Hypertensive chronic kidney disease with stage 1 through stage 4 chronic kidney disease, or unspecified chronic kidney disease: Secondary | ICD-10-CM | POA: Diagnosis not present

## 2015-07-05 DIAGNOSIS — S82142D Displaced bicondylar fracture of left tibia, subsequent encounter for closed fracture with routine healing: Secondary | ICD-10-CM | POA: Diagnosis not present

## 2015-07-05 DIAGNOSIS — K219 Gastro-esophageal reflux disease without esophagitis: Secondary | ICD-10-CM | POA: Diagnosis not present

## 2015-07-09 ENCOUNTER — Other Ambulatory Visit: Payer: Self-pay | Admitting: Family Medicine

## 2015-07-09 DIAGNOSIS — Z13820 Encounter for screening for osteoporosis: Secondary | ICD-10-CM | POA: Diagnosis not present

## 2015-07-09 DIAGNOSIS — M899 Disorder of bone, unspecified: Secondary | ICD-10-CM | POA: Diagnosis not present

## 2015-07-09 MED ORDER — HYDROCHLOROTHIAZIDE 12.5 MG PO CAPS: 12.5000 mg | ORAL_CAPSULE | Freq: Every day | ORAL | Status: DC

## 2015-07-09 NOTE — Telephone Encounter (Signed)
Needs refill on HCTZ.  [facility] on [address]

## 2015-07-10 DIAGNOSIS — R262 Difficulty in walking, not elsewhere classified: Secondary | ICD-10-CM | POA: Diagnosis not present

## 2015-07-10 DIAGNOSIS — M25662 Stiffness of left knee, not elsewhere classified: Secondary | ICD-10-CM | POA: Diagnosis not present

## 2015-07-10 DIAGNOSIS — M25562 Pain in left knee: Secondary | ICD-10-CM | POA: Diagnosis not present

## 2015-07-10 DIAGNOSIS — M6281 Muscle weakness (generalized): Secondary | ICD-10-CM | POA: Diagnosis not present

## 2015-07-11 ENCOUNTER — Encounter (HOSPITAL_BASED_OUTPATIENT_CLINIC_OR_DEPARTMENT_OTHER): Payer: Medicare Other | Attending: Surgery

## 2015-07-11 ENCOUNTER — Ambulatory Visit (INDEPENDENT_AMBULATORY_CARE_PROVIDER_SITE_OTHER): Payer: Medicare Other | Admitting: Family Medicine

## 2015-07-11 VITALS — BP 200/126 | HR 76 | Temp 97.5°F | Ht 68.0 in | Wt 169.6 lb

## 2015-07-11 DIAGNOSIS — W182XXA Fall in (into) shower or empty bathtub, initial encounter: Secondary | ICD-10-CM | POA: Diagnosis not present

## 2015-07-11 DIAGNOSIS — Z79899 Other long term (current) drug therapy: Secondary | ICD-10-CM | POA: Insufficient documentation

## 2015-07-11 DIAGNOSIS — I10 Essential (primary) hypertension: Secondary | ICD-10-CM | POA: Insufficient documentation

## 2015-07-11 DIAGNOSIS — I129 Hypertensive chronic kidney disease with stage 1 through stage 4 chronic kidney disease, or unspecified chronic kidney disease: Secondary | ICD-10-CM | POA: Diagnosis not present

## 2015-07-11 DIAGNOSIS — K210 Gastro-esophageal reflux disease with esophagitis: Secondary | ICD-10-CM | POA: Diagnosis not present

## 2015-07-11 DIAGNOSIS — Z7901 Long term (current) use of anticoagulants: Secondary | ICD-10-CM | POA: Diagnosis not present

## 2015-07-11 DIAGNOSIS — I472 Ventricular tachycardia: Secondary | ICD-10-CM | POA: Insufficient documentation

## 2015-07-11 DIAGNOSIS — M1990 Unspecified osteoarthritis, unspecified site: Secondary | ICD-10-CM | POA: Diagnosis not present

## 2015-07-11 DIAGNOSIS — I4891 Unspecified atrial fibrillation: Secondary | ICD-10-CM | POA: Insufficient documentation

## 2015-07-11 DIAGNOSIS — S81802A Unspecified open wound, left lower leg, initial encounter: Secondary | ICD-10-CM | POA: Insufficient documentation

## 2015-07-11 DIAGNOSIS — Z87891 Personal history of nicotine dependence: Secondary | ICD-10-CM | POA: Insufficient documentation

## 2015-07-11 DIAGNOSIS — N183 Chronic kidney disease, stage 3 (moderate): Secondary | ICD-10-CM | POA: Insufficient documentation

## 2015-07-11 NOTE — Patient Instructions (Addendum)
I am pleased that you are out of the knee imobilizer and moving. Your blood pressure is still up. Make sure you take the Cartia, the metoprolol and the HCTZ.  You need them all Go over your medicine list at home to make sure that our tow lists agree. Take your blood pressure at home every day for the next week.   See us in one week.  Bring in your pill bottles and your list of blood pressure readings.

## 2015-07-12 ENCOUNTER — Encounter: Payer: Self-pay | Admitting: Family Medicine

## 2015-07-12 NOTE — Progress Notes (Signed)
   Subjective:    Patient ID: [name], female    DOB: May 22, 1935, 80 y.o.   MRN: [id number]  HPI  My main issue is hypertension.  She was off her HCTZ for a few days.  I believe there was a communication problems about refills.  Patient did not bring in meds.  Per patient she is not taking her [place].  [facility]  She sees wound care today about knee would  She has seen ortho, is out of the knee imobilizer, doing limited wt bearing, being seen by PT for strenghtening and Rom exercises.    Review of Systems     Objective:   Physical Exam BP is up. Lungs clear Cardiac RRR without m or g        Assessment & Plan:

## 2015-07-12 NOTE — Assessment & Plan Note (Signed)
Start cartia.  Home BP   FU one week.  Bring in all meds.

## 2015-07-13 DIAGNOSIS — M25562 Pain in left knee: Secondary | ICD-10-CM | POA: Diagnosis not present

## 2015-07-13 DIAGNOSIS — L97222 Non-pressure chronic ulcer of left calf with fat layer exposed: Secondary | ICD-10-CM | POA: Diagnosis not present

## 2015-07-13 DIAGNOSIS — R262 Difficulty in walking, not elsewhere classified: Secondary | ICD-10-CM | POA: Diagnosis not present

## 2015-07-13 DIAGNOSIS — M6281 Muscle weakness (generalized): Secondary | ICD-10-CM | POA: Diagnosis not present

## 2015-07-13 DIAGNOSIS — M25662 Stiffness of left knee, not elsewhere classified: Secondary | ICD-10-CM | POA: Diagnosis not present

## 2015-07-13 DIAGNOSIS — S82111A Displaced fracture of right tibial spine, initial encounter for closed fracture: Secondary | ICD-10-CM | POA: Diagnosis not present

## 2015-07-16 DIAGNOSIS — Z7901 Long term (current) use of anticoagulants: Secondary | ICD-10-CM | POA: Diagnosis not present

## 2015-07-16 DIAGNOSIS — Z9181 History of falling: Secondary | ICD-10-CM | POA: Diagnosis not present

## 2015-07-16 DIAGNOSIS — S82142D Displaced bicondylar fracture of left tibia, subsequent encounter for closed fracture with routine healing: Secondary | ICD-10-CM | POA: Diagnosis not present

## 2015-07-16 DIAGNOSIS — N183 Chronic kidney disease, stage 3 (moderate): Secondary | ICD-10-CM | POA: Diagnosis not present

## 2015-07-16 DIAGNOSIS — I482 Chronic atrial fibrillation: Secondary | ICD-10-CM | POA: Diagnosis not present

## 2015-07-16 DIAGNOSIS — I129 Hypertensive chronic kidney disease with stage 1 through stage 4 chronic kidney disease, or unspecified chronic kidney disease: Secondary | ICD-10-CM | POA: Diagnosis not present

## 2015-07-16 DIAGNOSIS — K219 Gastro-esophageal reflux disease without esophagitis: Secondary | ICD-10-CM | POA: Diagnosis not present

## 2015-07-16 DIAGNOSIS — D62 Acute posthemorrhagic anemia: Secondary | ICD-10-CM | POA: Diagnosis not present

## 2015-07-17 DIAGNOSIS — M25562 Pain in left knee: Secondary | ICD-10-CM | POA: Diagnosis not present

## 2015-07-17 DIAGNOSIS — R262 Difficulty in walking, not elsewhere classified: Secondary | ICD-10-CM | POA: Diagnosis not present

## 2015-07-17 DIAGNOSIS — M25662 Stiffness of left knee, not elsewhere classified: Secondary | ICD-10-CM | POA: Diagnosis not present

## 2015-07-17 DIAGNOSIS — M6281 Muscle weakness (generalized): Secondary | ICD-10-CM | POA: Diagnosis not present

## 2015-07-18 ENCOUNTER — Ambulatory Visit (INDEPENDENT_AMBULATORY_CARE_PROVIDER_SITE_OTHER): Payer: Medicare Other | Admitting: Family Medicine

## 2015-07-18 ENCOUNTER — Other Ambulatory Visit: Payer: Self-pay | Admitting: *Deleted

## 2015-07-18 ENCOUNTER — Encounter: Payer: Self-pay | Admitting: Family Medicine

## 2015-07-18 VITALS — BP 161/78 | HR 64 | Temp 98.2°F | Ht 68.0 in | Wt 168.0 lb

## 2015-07-18 DIAGNOSIS — Z87891 Personal history of nicotine dependence: Secondary | ICD-10-CM | POA: Diagnosis not present

## 2015-07-18 DIAGNOSIS — K210 Gastro-esophageal reflux disease with esophagitis: Secondary | ICD-10-CM | POA: Diagnosis not present

## 2015-07-18 DIAGNOSIS — D62 Acute posthemorrhagic anemia: Secondary | ICD-10-CM | POA: Diagnosis not present

## 2015-07-18 DIAGNOSIS — I10 Essential (primary) hypertension: Secondary | ICD-10-CM

## 2015-07-18 DIAGNOSIS — D509 Iron deficiency anemia, unspecified: Secondary | ICD-10-CM | POA: Diagnosis not present

## 2015-07-18 DIAGNOSIS — I4891 Unspecified atrial fibrillation: Secondary | ICD-10-CM | POA: Diagnosis not present

## 2015-07-18 DIAGNOSIS — N183 Chronic kidney disease, stage 3 unspecified: Secondary | ICD-10-CM

## 2015-07-18 DIAGNOSIS — I129 Hypertensive chronic kidney disease with stage 1 through stage 4 chronic kidney disease, or unspecified chronic kidney disease: Secondary | ICD-10-CM | POA: Diagnosis not present

## 2015-07-18 DIAGNOSIS — Z79899 Other long term (current) drug therapy: Secondary | ICD-10-CM | POA: Diagnosis not present

## 2015-07-18 DIAGNOSIS — Z7901 Long term (current) use of anticoagulants: Secondary | ICD-10-CM | POA: Diagnosis not present

## 2015-07-18 DIAGNOSIS — S81802A Unspecified open wound, left lower leg, initial encounter: Secondary | ICD-10-CM | POA: Diagnosis not present

## 2015-07-18 DIAGNOSIS — I472 Ventricular tachycardia: Secondary | ICD-10-CM | POA: Diagnosis not present

## 2015-07-18 DIAGNOSIS — M1990 Unspecified osteoarthritis, unspecified site: Secondary | ICD-10-CM | POA: Diagnosis not present

## 2015-07-18 LAB — BASIC METABOLIC PANEL
BUN: 14 mg/dL (ref 7–25)
CO2: 26 mmol/L (ref 20–31)
Calcium: 9.3 mg/dL (ref 8.6–10.4)
Chloride: 105 mmol/L (ref 98–110)
Creat: 1.19 mg/dL — ABNORMAL HIGH (ref 0.60–0.93)
Glucose, Bld: 86 mg/dL (ref 65–99)
Potassium: 3.5 mmol/L (ref 3.5–5.3)
Sodium: 140 mmol/L (ref 135–146)

## 2015-07-18 LAB — CBC
HCT: 34.5 % — ABNORMAL LOW (ref 35.0–45.0)
Hemoglobin: 10.8 g/dL — ABNORMAL LOW (ref 11.7–15.5)
MCH: 26.4 pg — ABNORMAL LOW (ref 27.0–33.0)
MCHC: 31.3 g/dL — ABNORMAL LOW (ref 32.0–36.0)
MCV: 84.4 fL (ref 80.0–100.0)
MPV: 9.9 fL (ref 7.5–12.5)
Platelets: 256 10*3/uL (ref 140–400)
RBC: 4.09 MIL/uL (ref 3.80–5.10)
RDW: 15.3 % — ABNORMAL HIGH (ref 11.0–15.0)
WBC: 7.3 10*3/uL (ref 3.8–10.8)

## 2015-07-18 MED ORDER — WARFARIN SODIUM 10 MG PO TABS: ORAL_TABLET | ORAL | Status: DC

## 2015-07-18 NOTE — Telephone Encounter (Signed)
Refill done Coumadin 10 mg

## 2015-07-18 NOTE — Patient Instructions (Signed)
I am pleased with your blood pressure. I hope you keep your weight down.  168 is much healthier than 180.  150 would be fabulous Keep up the work with the wound center Stay on all the same medications. Continue to avoid salt/sodium - that is what will run up your blood pressure. Please keep checking your blood pressure at home a couple times per week.

## 2015-07-19 ENCOUNTER — Other Ambulatory Visit: Payer: Self-pay | Admitting: Internal Medicine

## 2015-07-19 NOTE — Assessment & Plan Note (Signed)
Recheck Hgb, Results show improving nicely.

## 2015-07-19 NOTE — Assessment & Plan Note (Signed)
Markedly improved.  Given good home BPs, I am reluctant to add further meds.

## 2015-07-19 NOTE — Assessment & Plan Note (Signed)
Check BMP.  Nicely stable with lowered BP

## 2015-07-19 NOTE — Progress Notes (Signed)
   Subjective:    Patient ID: [name], female    DOB: [birth year], 80 y.o.   MRN: [id number]  HPI Recheck hypertension.  She is now taking all meds as prescribed.  Feels better.  Home BPs are running good: 139/94, 132/82, 147/82, 148/78, 146/74, 139/78, 114/73.  Also following low salt diet.  Has not had recheck of hemoglobin.  Had acute blood loss anemia with knee fracture  Doing well in rehab.  Pleased with return of function of knee.  Seeing wound care for left knee wound.    Review of Systems     Objective:   Physical Exam Cardiac RRR without m or g Lungs clear Ext no edema       Assessment & Plan:

## 2015-07-20 DIAGNOSIS — N183 Chronic kidney disease, stage 3 (moderate): Secondary | ICD-10-CM | POA: Diagnosis not present

## 2015-07-20 DIAGNOSIS — S82142D Displaced bicondylar fracture of left tibia, subsequent encounter for closed fracture with routine healing: Secondary | ICD-10-CM | POA: Diagnosis not present

## 2015-07-20 DIAGNOSIS — Z7901 Long term (current) use of anticoagulants: Secondary | ICD-10-CM | POA: Diagnosis not present

## 2015-07-20 DIAGNOSIS — K219 Gastro-esophageal reflux disease without esophagitis: Secondary | ICD-10-CM | POA: Diagnosis not present

## 2015-07-20 DIAGNOSIS — I129 Hypertensive chronic kidney disease with stage 1 through stage 4 chronic kidney disease, or unspecified chronic kidney disease: Secondary | ICD-10-CM | POA: Diagnosis not present

## 2015-07-20 DIAGNOSIS — D62 Acute posthemorrhagic anemia: Secondary | ICD-10-CM | POA: Diagnosis not present

## 2015-07-20 DIAGNOSIS — Z9181 History of falling: Secondary | ICD-10-CM | POA: Diagnosis not present

## 2015-07-20 DIAGNOSIS — I482 Chronic atrial fibrillation: Secondary | ICD-10-CM | POA: Diagnosis not present

## 2015-07-23 ENCOUNTER — Ambulatory Visit (INDEPENDENT_AMBULATORY_CARE_PROVIDER_SITE_OTHER): Payer: Medicare Other | Admitting: Pharmacist

## 2015-07-23 DIAGNOSIS — Z954 Presence of other heart-valve replacement: Secondary | ICD-10-CM | POA: Diagnosis not present

## 2015-07-23 DIAGNOSIS — M6281 Muscle weakness (generalized): Secondary | ICD-10-CM | POA: Diagnosis not present

## 2015-07-23 DIAGNOSIS — S82142D Displaced bicondylar fracture of left tibia, subsequent encounter for closed fracture with routine healing: Secondary | ICD-10-CM | POA: Diagnosis not present

## 2015-07-23 DIAGNOSIS — R262 Difficulty in walking, not elsewhere classified: Secondary | ICD-10-CM | POA: Diagnosis not present

## 2015-07-23 DIAGNOSIS — I482 Chronic atrial fibrillation, unspecified: Secondary | ICD-10-CM

## 2015-07-23 DIAGNOSIS — M25562 Pain in left knee: Secondary | ICD-10-CM | POA: Diagnosis not present

## 2015-07-23 DIAGNOSIS — M25662 Stiffness of left knee, not elsewhere classified: Secondary | ICD-10-CM | POA: Diagnosis not present

## 2015-07-23 DIAGNOSIS — Z9181 History of falling: Secondary | ICD-10-CM | POA: Diagnosis not present

## 2015-07-23 DIAGNOSIS — Z5181 Encounter for therapeutic drug level monitoring: Secondary | ICD-10-CM

## 2015-07-23 DIAGNOSIS — Z7901 Long term (current) use of anticoagulants: Secondary | ICD-10-CM | POA: Diagnosis not present

## 2015-07-23 DIAGNOSIS — K219 Gastro-esophageal reflux disease without esophagitis: Secondary | ICD-10-CM | POA: Diagnosis not present

## 2015-07-23 DIAGNOSIS — I129 Hypertensive chronic kidney disease with stage 1 through stage 4 chronic kidney disease, or unspecified chronic kidney disease: Secondary | ICD-10-CM | POA: Diagnosis not present

## 2015-07-23 DIAGNOSIS — Z952 Presence of prosthetic heart valve: Secondary | ICD-10-CM

## 2015-07-23 DIAGNOSIS — D62 Acute posthemorrhagic anemia: Secondary | ICD-10-CM | POA: Diagnosis not present

## 2015-07-23 DIAGNOSIS — N183 Chronic kidney disease, stage 3 (moderate): Secondary | ICD-10-CM | POA: Diagnosis not present

## 2015-07-23 LAB — POCT INR: INR: 3.4

## 2015-07-25 ENCOUNTER — Encounter (HOSPITAL_BASED_OUTPATIENT_CLINIC_OR_DEPARTMENT_OTHER): Payer: Medicare Other | Attending: Surgery

## 2015-07-25 DIAGNOSIS — M70862 Other soft tissue disorders related to use, overuse and pressure, left lower leg: Secondary | ICD-10-CM | POA: Insufficient documentation

## 2015-07-25 DIAGNOSIS — N183 Chronic kidney disease, stage 3 (moderate): Secondary | ICD-10-CM | POA: Diagnosis not present

## 2015-07-25 DIAGNOSIS — I129 Hypertensive chronic kidney disease with stage 1 through stage 4 chronic kidney disease, or unspecified chronic kidney disease: Secondary | ICD-10-CM | POA: Diagnosis not present

## 2015-07-25 DIAGNOSIS — Z87891 Personal history of nicotine dependence: Secondary | ICD-10-CM | POA: Diagnosis not present

## 2015-07-25 DIAGNOSIS — Z7901 Long term (current) use of anticoagulants: Secondary | ICD-10-CM | POA: Diagnosis not present

## 2015-07-25 DIAGNOSIS — S82111A Displaced fracture of right tibial spine, initial encounter for closed fracture: Secondary | ICD-10-CM | POA: Diagnosis not present

## 2015-07-25 DIAGNOSIS — L97822 Non-pressure chronic ulcer of other part of left lower leg with fat layer exposed: Secondary | ICD-10-CM | POA: Insufficient documentation

## 2015-07-25 DIAGNOSIS — I4891 Unspecified atrial fibrillation: Secondary | ICD-10-CM | POA: Diagnosis not present

## 2015-07-25 DIAGNOSIS — S81802A Unspecified open wound, left lower leg, initial encounter: Secondary | ICD-10-CM | POA: Diagnosis not present

## 2015-07-26 DIAGNOSIS — M25562 Pain in left knee: Secondary | ICD-10-CM | POA: Diagnosis not present

## 2015-07-26 DIAGNOSIS — M6281 Muscle weakness (generalized): Secondary | ICD-10-CM | POA: Diagnosis not present

## 2015-07-26 DIAGNOSIS — M25662 Stiffness of left knee, not elsewhere classified: Secondary | ICD-10-CM | POA: Diagnosis not present

## 2015-07-26 DIAGNOSIS — R262 Difficulty in walking, not elsewhere classified: Secondary | ICD-10-CM | POA: Diagnosis not present

## 2015-07-27 DIAGNOSIS — K219 Gastro-esophageal reflux disease without esophagitis: Secondary | ICD-10-CM | POA: Diagnosis not present

## 2015-07-27 DIAGNOSIS — Z7901 Long term (current) use of anticoagulants: Secondary | ICD-10-CM | POA: Diagnosis not present

## 2015-07-27 DIAGNOSIS — D62 Acute posthemorrhagic anemia: Secondary | ICD-10-CM | POA: Diagnosis not present

## 2015-07-27 DIAGNOSIS — S82142D Displaced bicondylar fracture of left tibia, subsequent encounter for closed fracture with routine healing: Secondary | ICD-10-CM | POA: Diagnosis not present

## 2015-07-27 DIAGNOSIS — I482 Chronic atrial fibrillation: Secondary | ICD-10-CM | POA: Diagnosis not present

## 2015-07-27 DIAGNOSIS — N183 Chronic kidney disease, stage 3 (moderate): Secondary | ICD-10-CM | POA: Diagnosis not present

## 2015-07-27 DIAGNOSIS — Z9181 History of falling: Secondary | ICD-10-CM | POA: Diagnosis not present

## 2015-07-27 DIAGNOSIS — I129 Hypertensive chronic kidney disease with stage 1 through stage 4 chronic kidney disease, or unspecified chronic kidney disease: Secondary | ICD-10-CM | POA: Diagnosis not present

## 2015-07-30 DIAGNOSIS — S82142D Displaced bicondylar fracture of left tibia, subsequent encounter for closed fracture with routine healing: Secondary | ICD-10-CM | POA: Diagnosis not present

## 2015-07-30 DIAGNOSIS — Z7901 Long term (current) use of anticoagulants: Secondary | ICD-10-CM | POA: Diagnosis not present

## 2015-07-30 DIAGNOSIS — N183 Chronic kidney disease, stage 3 (moderate): Secondary | ICD-10-CM | POA: Diagnosis not present

## 2015-07-30 DIAGNOSIS — D62 Acute posthemorrhagic anemia: Secondary | ICD-10-CM | POA: Diagnosis not present

## 2015-07-30 DIAGNOSIS — Z9181 History of falling: Secondary | ICD-10-CM | POA: Diagnosis not present

## 2015-07-30 DIAGNOSIS — I482 Chronic atrial fibrillation: Secondary | ICD-10-CM | POA: Diagnosis not present

## 2015-07-30 DIAGNOSIS — K219 Gastro-esophageal reflux disease without esophagitis: Secondary | ICD-10-CM | POA: Diagnosis not present

## 2015-07-30 DIAGNOSIS — I129 Hypertensive chronic kidney disease with stage 1 through stage 4 chronic kidney disease, or unspecified chronic kidney disease: Secondary | ICD-10-CM | POA: Diagnosis not present

## 2015-07-31 DIAGNOSIS — R262 Difficulty in walking, not elsewhere classified: Secondary | ICD-10-CM | POA: Diagnosis not present

## 2015-07-31 DIAGNOSIS — M6281 Muscle weakness (generalized): Secondary | ICD-10-CM | POA: Diagnosis not present

## 2015-07-31 DIAGNOSIS — M25662 Stiffness of left knee, not elsewhere classified: Secondary | ICD-10-CM | POA: Diagnosis not present

## 2015-07-31 DIAGNOSIS — M25562 Pain in left knee: Secondary | ICD-10-CM | POA: Diagnosis not present

## 2015-08-01 DIAGNOSIS — Z7901 Long term (current) use of anticoagulants: Secondary | ICD-10-CM | POA: Diagnosis not present

## 2015-08-01 DIAGNOSIS — Z87891 Personal history of nicotine dependence: Secondary | ICD-10-CM | POA: Diagnosis not present

## 2015-08-01 DIAGNOSIS — M70862 Other soft tissue disorders related to use, overuse and pressure, left lower leg: Secondary | ICD-10-CM | POA: Diagnosis not present

## 2015-08-01 DIAGNOSIS — S81802A Unspecified open wound, left lower leg, initial encounter: Secondary | ICD-10-CM | POA: Diagnosis not present

## 2015-08-01 DIAGNOSIS — L97822 Non-pressure chronic ulcer of other part of left lower leg with fat layer exposed: Secondary | ICD-10-CM | POA: Diagnosis not present

## 2015-08-01 DIAGNOSIS — N183 Chronic kidney disease, stage 3 (moderate): Secondary | ICD-10-CM | POA: Diagnosis not present

## 2015-08-01 DIAGNOSIS — I4891 Unspecified atrial fibrillation: Secondary | ICD-10-CM | POA: Diagnosis not present

## 2015-08-01 DIAGNOSIS — I129 Hypertensive chronic kidney disease with stage 1 through stage 4 chronic kidney disease, or unspecified chronic kidney disease: Secondary | ICD-10-CM | POA: Diagnosis not present

## 2015-08-01 DIAGNOSIS — Y29XXXA Contact with blunt object, undetermined intent, initial encounter: Secondary | ICD-10-CM | POA: Diagnosis not present

## 2015-08-02 ENCOUNTER — Other Ambulatory Visit: Payer: Self-pay | Admitting: Family Medicine

## 2015-08-02 DIAGNOSIS — R262 Difficulty in walking, not elsewhere classified: Secondary | ICD-10-CM | POA: Diagnosis not present

## 2015-08-02 DIAGNOSIS — M6281 Muscle weakness (generalized): Secondary | ICD-10-CM | POA: Diagnosis not present

## 2015-08-02 DIAGNOSIS — M25662 Stiffness of left knee, not elsewhere classified: Secondary | ICD-10-CM | POA: Diagnosis not present

## 2015-08-02 DIAGNOSIS — M25562 Pain in left knee: Secondary | ICD-10-CM | POA: Diagnosis not present

## 2015-08-03 DIAGNOSIS — N183 Chronic kidney disease, stage 3 (moderate): Secondary | ICD-10-CM | POA: Diagnosis not present

## 2015-08-03 DIAGNOSIS — Z7901 Long term (current) use of anticoagulants: Secondary | ICD-10-CM | POA: Diagnosis not present

## 2015-08-03 DIAGNOSIS — I129 Hypertensive chronic kidney disease with stage 1 through stage 4 chronic kidney disease, or unspecified chronic kidney disease: Secondary | ICD-10-CM | POA: Diagnosis not present

## 2015-08-03 DIAGNOSIS — D62 Acute posthemorrhagic anemia: Secondary | ICD-10-CM | POA: Diagnosis not present

## 2015-08-03 DIAGNOSIS — Z9181 History of falling: Secondary | ICD-10-CM | POA: Diagnosis not present

## 2015-08-03 DIAGNOSIS — I482 Chronic atrial fibrillation: Secondary | ICD-10-CM | POA: Diagnosis not present

## 2015-08-03 DIAGNOSIS — S82142D Displaced bicondylar fracture of left tibia, subsequent encounter for closed fracture with routine healing: Secondary | ICD-10-CM | POA: Diagnosis not present

## 2015-08-03 DIAGNOSIS — K219 Gastro-esophageal reflux disease without esophagitis: Secondary | ICD-10-CM | POA: Diagnosis not present

## 2015-08-04 DIAGNOSIS — L97222 Non-pressure chronic ulcer of left calf with fat layer exposed: Secondary | ICD-10-CM | POA: Diagnosis not present

## 2015-08-04 DIAGNOSIS — S82111A Displaced fracture of right tibial spine, initial encounter for closed fracture: Secondary | ICD-10-CM | POA: Diagnosis not present

## 2015-08-06 DIAGNOSIS — N183 Chronic kidney disease, stage 3 (moderate): Secondary | ICD-10-CM | POA: Diagnosis not present

## 2015-08-06 DIAGNOSIS — I482 Chronic atrial fibrillation: Secondary | ICD-10-CM | POA: Diagnosis not present

## 2015-08-06 DIAGNOSIS — I129 Hypertensive chronic kidney disease with stage 1 through stage 4 chronic kidney disease, or unspecified chronic kidney disease: Secondary | ICD-10-CM | POA: Diagnosis not present

## 2015-08-06 DIAGNOSIS — D62 Acute posthemorrhagic anemia: Secondary | ICD-10-CM | POA: Diagnosis not present

## 2015-08-06 DIAGNOSIS — K219 Gastro-esophageal reflux disease without esophagitis: Secondary | ICD-10-CM | POA: Diagnosis not present

## 2015-08-06 DIAGNOSIS — Z7901 Long term (current) use of anticoagulants: Secondary | ICD-10-CM | POA: Diagnosis not present

## 2015-08-06 DIAGNOSIS — Z9181 History of falling: Secondary | ICD-10-CM | POA: Diagnosis not present

## 2015-08-06 DIAGNOSIS — S82142D Displaced bicondylar fracture of left tibia, subsequent encounter for closed fracture with routine healing: Secondary | ICD-10-CM | POA: Diagnosis not present

## 2015-08-08 DIAGNOSIS — I129 Hypertensive chronic kidney disease with stage 1 through stage 4 chronic kidney disease, or unspecified chronic kidney disease: Secondary | ICD-10-CM | POA: Diagnosis not present

## 2015-08-08 DIAGNOSIS — M70862 Other soft tissue disorders related to use, overuse and pressure, left lower leg: Secondary | ICD-10-CM | POA: Diagnosis not present

## 2015-08-08 DIAGNOSIS — L97822 Non-pressure chronic ulcer of other part of left lower leg with fat layer exposed: Secondary | ICD-10-CM | POA: Diagnosis not present

## 2015-08-08 DIAGNOSIS — Z87891 Personal history of nicotine dependence: Secondary | ICD-10-CM | POA: Diagnosis not present

## 2015-08-08 DIAGNOSIS — I4891 Unspecified atrial fibrillation: Secondary | ICD-10-CM | POA: Diagnosis not present

## 2015-08-08 DIAGNOSIS — N183 Chronic kidney disease, stage 3 (moderate): Secondary | ICD-10-CM | POA: Diagnosis not present

## 2015-08-08 DIAGNOSIS — S82111A Displaced fracture of right tibial spine, initial encounter for closed fracture: Secondary | ICD-10-CM | POA: Diagnosis not present

## 2015-08-08 DIAGNOSIS — L97222 Non-pressure chronic ulcer of left calf with fat layer exposed: Secondary | ICD-10-CM | POA: Diagnosis not present

## 2015-08-08 DIAGNOSIS — S81802A Unspecified open wound, left lower leg, initial encounter: Secondary | ICD-10-CM | POA: Diagnosis not present

## 2015-08-08 DIAGNOSIS — Z7901 Long term (current) use of anticoagulants: Secondary | ICD-10-CM | POA: Diagnosis not present

## 2015-08-10 DIAGNOSIS — Z7901 Long term (current) use of anticoagulants: Secondary | ICD-10-CM | POA: Diagnosis not present

## 2015-08-10 DIAGNOSIS — I482 Chronic atrial fibrillation: Secondary | ICD-10-CM | POA: Diagnosis not present

## 2015-08-10 DIAGNOSIS — S82142D Displaced bicondylar fracture of left tibia, subsequent encounter for closed fracture with routine healing: Secondary | ICD-10-CM | POA: Diagnosis not present

## 2015-08-10 DIAGNOSIS — I129 Hypertensive chronic kidney disease with stage 1 through stage 4 chronic kidney disease, or unspecified chronic kidney disease: Secondary | ICD-10-CM | POA: Diagnosis not present

## 2015-08-10 DIAGNOSIS — K219 Gastro-esophageal reflux disease without esophagitis: Secondary | ICD-10-CM | POA: Diagnosis not present

## 2015-08-10 DIAGNOSIS — N183 Chronic kidney disease, stage 3 (moderate): Secondary | ICD-10-CM | POA: Diagnosis not present

## 2015-08-10 DIAGNOSIS — Z9181 History of falling: Secondary | ICD-10-CM | POA: Diagnosis not present

## 2015-08-10 DIAGNOSIS — D62 Acute posthemorrhagic anemia: Secondary | ICD-10-CM | POA: Diagnosis not present

## 2015-08-12 DIAGNOSIS — L97222 Non-pressure chronic ulcer of left calf with fat layer exposed: Secondary | ICD-10-CM | POA: Diagnosis not present

## 2015-08-13 ENCOUNTER — Emergency Department (HOSPITAL_COMMUNITY): Payer: Medicare Other

## 2015-08-13 ENCOUNTER — Encounter (HOSPITAL_COMMUNITY): Payer: Self-pay | Admitting: Emergency Medicine

## 2015-08-13 ENCOUNTER — Emergency Department (HOSPITAL_COMMUNITY)
Admission: EM | Admit: 2015-08-13 | Discharge: 2015-08-13 | Disposition: A | Discharge status: Home / Self Care | Payer: Medicare Other | Attending: Emergency Medicine | Admitting: Emergency Medicine

## 2015-08-13 DIAGNOSIS — Z7901 Long term (current) use of anticoagulants: Secondary | ICD-10-CM | POA: Insufficient documentation

## 2015-08-13 DIAGNOSIS — N183 Chronic kidney disease, stage 3 (moderate): Secondary | ICD-10-CM | POA: Diagnosis not present

## 2015-08-13 DIAGNOSIS — S52612A Displaced fracture of left ulna styloid process, initial encounter for closed fracture: Secondary | ICD-10-CM | POA: Diagnosis not present

## 2015-08-13 DIAGNOSIS — S59912A Unspecified injury of left forearm, initial encounter: Secondary | ICD-10-CM | POA: Diagnosis not present

## 2015-08-13 DIAGNOSIS — S52502A Unspecified fracture of the lower end of left radius, initial encounter for closed fracture: Secondary | ICD-10-CM

## 2015-08-13 DIAGNOSIS — S52592A Other fractures of lower end of left radius, initial encounter for closed fracture: Secondary | ICD-10-CM | POA: Diagnosis not present

## 2015-08-13 DIAGNOSIS — S52302A Unspecified fracture of shaft of left radius, initial encounter for closed fracture: Secondary | ICD-10-CM | POA: Insufficient documentation

## 2015-08-13 DIAGNOSIS — W010XXA Fall on same level from slipping, tripping and stumbling without subsequent striking against object, initial encounter: Secondary | ICD-10-CM | POA: Diagnosis not present

## 2015-08-13 DIAGNOSIS — M7989 Other specified soft tissue disorders: Secondary | ICD-10-CM | POA: Diagnosis not present

## 2015-08-13 DIAGNOSIS — J45909 Unspecified asthma, uncomplicated: Secondary | ICD-10-CM | POA: Insufficient documentation

## 2015-08-13 DIAGNOSIS — S59292A Other physeal fracture of lower end of radius, left arm, initial encounter for closed fracture: Secondary | ICD-10-CM | POA: Diagnosis not present

## 2015-08-13 DIAGNOSIS — Y939 Activity, unspecified: Secondary | ICD-10-CM | POA: Insufficient documentation

## 2015-08-13 DIAGNOSIS — Y929 Unspecified place or not applicable: Secondary | ICD-10-CM | POA: Insufficient documentation

## 2015-08-13 DIAGNOSIS — Z87891 Personal history of nicotine dependence: Secondary | ICD-10-CM | POA: Diagnosis not present

## 2015-08-13 DIAGNOSIS — Y999 Unspecified external cause status: Secondary | ICD-10-CM | POA: Insufficient documentation

## 2015-08-13 DIAGNOSIS — I129 Hypertensive chronic kidney disease with stage 1 through stage 4 chronic kidney disease, or unspecified chronic kidney disease: Secondary | ICD-10-CM | POA: Diagnosis not present

## 2015-08-13 LAB — PROTIME-INR
INR: 3.28 — ABNORMAL HIGH (ref 0.00–1.49)
Prothrombin Time: 32.7 seconds — ABNORMAL HIGH (ref 11.6–15.2)

## 2015-08-13 MED ORDER — HYDROCODONE-ACETAMINOPHEN 5-325 MG PO TABS: 1.0000 | ORAL_TABLET | Freq: Four times a day (QID) | ORAL | 0 refills | Status: DC | PRN

## 2015-08-13 MED ORDER — MORPHINE SULFATE (PF) 4 MG/ML IV SOLN
6.0000 mg | Freq: Once | INTRAVENOUS | Status: AC
  Administered 2015-08-13: 6 mg via INTRAMUSCULAR
  Filled 2015-08-13: qty 2

## 2015-08-13 MED ORDER — ONDANSETRON 4 MG PO TBDP
4.0000 mg | ORAL_TABLET | Freq: Once | ORAL | Status: AC
  Administered 2015-08-13: 4 mg via ORAL
  Filled 2015-08-13: qty 1

## 2015-08-13 NOTE — ED Triage Notes (Signed)
P[t. Stated, I tripped over a cord and try to break my fall with my arm. Left arm swollen and painful.

## 2015-08-13 NOTE — ED Notes (Signed)
Phlebotomy at the bedside  

## 2015-08-13 NOTE — ED Notes (Signed)
Pt returned from X-ray.  

## 2015-08-13 NOTE — ED Notes (Signed)
Paged Ortho  

## 2015-08-13 NOTE — ED Notes (Signed)
Family at bedside. Daughter 

## 2015-08-13 NOTE — Progress Notes (Signed)
ED CM sent pcp Denny Levy an in basket message updating her on 08/13/15 [facility] ED visit for pt and recommended tx plan  CM discussed pt concern of possibly needing a referral from PCP to see Dr Izora Ribas Specialist in the message IN case pcp would be needed to assist this pt for f/u care process

## 2015-08-13 NOTE — ED Notes (Signed)
Spoke with Ortho about pt's splint and sling

## 2015-08-13 NOTE — Progress Notes (Signed)
Orthopedic Tech Progress Note Patient Details:  [name] 12/23/1935 [id number]  Ortho Devices Type of Ortho Device: Ace wrap, Post (short) splint, Arm sling Splint Material: Fiberglass Ortho Device/Splint Interventions: Application   Saul Fordyce 08/13/2015, 12:37 PM

## 2015-08-13 NOTE — ED Provider Notes (Signed)
[facility] Provider Note   CSN: [id number] Arrival date & time: 08/13/15  1000  First Provider Contact:  First MD Initiated Contact with Patient 08/13/15 1033        History   Chief Complaint Chief Complaint  Patient presents with  . Arm Injury    HPI [name] is a 80 y.o. female.  Patient is a 80 year old female with history of chronic A. fib and status post mechanical heart valve on Coumadin, hypertension and chronic wound on the left lower leg currently with wound VAC presenting today after mechanical fall. She tripped over a cord on the floor and fell on her left side. Most of the impact was on her left hand. She was able to get off the floor get dressed and drive herself to her doctor's appointment. She continues to have pain in her left wrist which is worse with movement.  She denies any hip pain or difficulty ambulating. She did not hit her head or lose consciousness. No chest or abdominal tenderness. Last INR was approximately one month ago and is due to be checked on Thursday.   The history is provided by the patient.  Arm Injury   This is a new problem. The current episode started 1 to 2 hours ago. The problem occurs constantly. The problem has been gradually worsening. The pain is present in the left wrist. The quality of the pain is described as pounding and constant. The pain is at a severity of 9/10. The pain is severe. Associated symptoms include limited range of motion. Exacerbated by: movement. She has tried rest for the symptoms. The treatment provided no relief. There has been a history of trauma.    Past Medical History:  Diagnosis Date  . Asthma    years ago  . Chronic atrial fibrillation (HCC)   . Dilated cardiomyopathy (HCC)    history of this, now resolved  . Dysphagia    Hx  . Fracture of tibial plateau 05/21/2015  . GERD (gastroesophageal reflux disease)   . GI bleed   . Hemorrhoid 04/2014   bleeding  . History of blood transfusion     . Hypertension   . Microcytic anemia   . Nonsustained ventricular tachycardia (HCC)   . Osteoporosis   . Protein in urine   . RBBB (right bundle branch block)   . Rheumatic mitral valve disease     Patient Active Problem List   Diagnosis Date Noted  . Leg ulcer, left (HCC) 06/27/2015  . High risk social situation 06/27/2015  . Anemia due to blood loss, acute 06/01/2015  . Unstable angina (HCC) 06/01/2015  . Chronic atrial fibrillation (HCC)   . CKD (chronic kidney disease), stage III 05/31/2015  . Tibial plateau fracture 05/22/2015  . Healthcare maintenance 04/30/2015  . Encounter for therapeutic drug monitoring 02/15/2013  . Long term (current) use of anticoagulants 05/03/2010  . RBBB 02/16/2008  . VENTRICULAR TACHYCARDIA 02/16/2008  . History of mitral valve replacement - St Jude mechanical valve 02/16/2008  . HYPERTENSION, BENIGN SYSTEMIC 03/19/2006  . ATRIAL FIBRILLATION 03/19/2006  . RHINITIS, ALLERGIC 03/19/2006  . REFLUX ESOPHAGITIS 03/19/2006  . OSTEOARTHRITIS, LOWER LEG 03/19/2006  . Osteoporosis 03/19/2006    Past Surgical History:  Procedure Laterality Date  . CHOLECYSTECTOMY    . COLONOSCOPY    . COLONOSCOPY W/ POLYPECTOMY    . MITOMYCIN C APPLICATION Right 05/17/2014   Procedure: MITOMYCIN C APPLICATION;  Surgeon: Chalmers Guest, MD;  Location: [facility] OR;  Service: Ophthalmology;  Laterality: Right;  . MITRAL VALVE REPLACEMENT  1987   St Jude mechanical valve  . TRABECULECTOMY Right 05/17/2014   Procedure: TRABECULECTOMY WITH MITOMYCIN RIGHT EYE;  Surgeon: Chalmers Guest, MD;  Location: [facility] OR;  Service: Ophthalmology;  Laterality: Right;  . TUBAL LIGATION    . US ECHOCARDIOGRAPHY  02/2008, 03/2006, 06/2004    OB History    No data available       Home Medications    Prior to Admission medications   Medication Sig Start Date End Date Taking? Authorizing Provider  Brinzolamide-Brimonidine 1-0.2 % SUSP Place 1 drop into the left eye 3 (three) times daily.      Historical Provider, MD  CARTIA XT 180 MG 24 hr capsule TAKE 1 CAPSULE BY MOUTH DAILY Patient not taking: Reported on 07/11/2015 05/15/15   Marinus Maw, MD  Cholecalciferol (VITAMIN D) 2000 UNITS tablet Take 2,000 Units by mouth daily.    Historical Provider, MD  DIGOX 125 MCG tablet TAKE 1 TABLET BY MOUTH EVERY DAY 03/07/15   Marinus Maw, MD  hydrochlorothiazide (MICROZIDE) 12.5 MG capsule TAKE 1 CAPSULE(12.5 MG) BY MOUTH DAILY 08/02/15   Nestor Ramp, MD  lipase/protease/amylase (CREON) 12000 units CPEP capsule Take 48,000 Units by mouth 3 (three) times daily before meals.    Historical Provider, MD  metoprolol (LOPRESSOR) 50 MG tablet TAKE 1 1/2 TABLETS BY MOUTH TWICE DAILY Patient taking differently: TAKE 1.5 TABLETS (75mg ) BY MOUTH TWICE DAILY 03/14/15   Marinus Maw, MD  mirabegron ER (MYRBETRIQ) 25 MG TB24 tablet Take 25 mg by mouth daily.    Historical Provider, MD  pantoprazole (PROTONIX) 40 MG tablet Take 40 mg by mouth daily.  08/10/12   Historical Provider, MD  polyethylene glycol (MIRALAX / GLYCOLAX) packet Take 17 g by mouth 2 (two) times daily. 05/25/15   Hillery Hunter Melancon, MD  TRAVATAN Z 0.004 % SOLN ophthalmic solution Place 1 drop into both eyes at bedtime.  04/18/14   Historical Provider, MD  warfarin (COUMADIN) 10 MG tablet Take as directed by coumadin clinic 07/18/15   Marinus Maw, MD    Family History Family History  Problem Relation Age of Onset  . Hypertension Mother   . Cancer Sister     in the Bladder  . Colon cancer Neg Hx     Social History Social History  Substance Use Topics  . Smoking status: Former Games developer  . Smokeless tobacco: Never Used     Comment: quiit 1987  . Alcohol use No     Allergies   Esomeprazole magnesium   Review of Systems Review of Systems  All other systems reviewed and are negative.    Physical Exam Updated Vital Signs BP (!) 208/125 (BP Location: Right Arm)   Pulse 117   Temp 97.9 F (36.6 C) (Oral)   Resp 12   Ht 5'  7" (1.702 m)   Wt 168 lb (76.2 kg)   SpO2 99%   BMI 26.31 kg/m   Physical Exam  Constitutional: She is oriented to person, place, and time. She appears well-developed and well-nourished. She appears distressed.  Is tearful and appears to be in pain  HENT:  Head: Normocephalic and atraumatic.  Eyes: EOM are normal. Pupils are equal, round, and reactive to light. Right conjunctiva is injected. Left conjunctiva is injected.  Cardiovascular: Normal heart sounds and intact distal pulses.  An irregularly irregular rhythm present. Tachycardia present.  Exam reveals no friction rub.   No murmur  heard. Pulmonary/Chest: Effort normal and breath sounds normal. She has no wheezes. She has no rales.  Abdominal: Soft. Bowel sounds are normal. She exhibits no distension. There is no tenderness. There is no rebound and no guarding.  Musculoskeletal: She exhibits tenderness and deformity.       Left shoulder: Normal.       Left elbow: Normal.       Left wrist: She exhibits decreased range of motion, tenderness, bony tenderness, swelling and deformity.       Arms: No edema  Neurological: She is alert and oriented to person, place, and time. No cranial nerve deficit.  Skin: Skin is warm and dry. Capillary refill takes less than 2 seconds. No rash noted.  Psychiatric: She has a normal mood and affect. Her behavior is normal.  Nursing note and vitals reviewed.    ED Treatments / Results  Labs (all labs ordered are listed, but only abnormal results are displayed) Labs Reviewed  PROTIME-INR - Abnormal; Notable for the following:       Result Value   Prothrombin Time 32.7 (*)    INR 3.28 (*)    All other components within normal limits    EKG  EKG Interpretation None       Radiology Dg Forearm Left  Result Date: 08/13/2015 CLINICAL DATA:  Fall.  Swelling. EXAM: LEFT FOREARM - 2 VIEW COMPARISON:  No recent prior. FINDINGS: Distal left radial fracture cannot be excluded. Diffuse degenerative  change present about the elbow on wrist. No other focal bony abnormality. Peripheral vascular calcification IMPRESSION: 1.  Distal left radial fracture cannot be excluded. 2. Peripheral vascular disease. Electronically Signed   By: Maisie Fus  Register   On: 08/13/2015 11:25  Dg Wrist Complete Left  Result Date: 08/13/2015 CLINICAL DATA:  Fall.  Swelling. EXAM: LEFT WRIST - COMPLETE 3+ VIEW COMPARISON:  No prior. FINDINGS: Distal left radial nondisplaced fracture is noted. This is best demonstrated on left wrist series is post forearm series. The fracture is minimally displaced. Slight displaced ulnar styloid fracture noted. Diffuse degenerative change. Peripheral vascular calcification . IMPRESSION: 1. Slightly displaced distal left radial metaphysis fracture. Slight displaced ulnar styloid fracture. 2. Peripheral vascular disease. Electronically Signed   By: Maisie Fus  Register   On: 08/13/2015 11:26   Procedures Procedures (including critical care time)  Medications Ordered in ED Medications  morphine 4 MG/ML injection 6 mg (6 mg Intramuscular Given 08/13/15 1046)  ondansetron (ZOFRAN-ODT) disintegrating tablet 4 mg (4 mg Oral Given 08/13/15 1047)     Initial Impression / Assessment and Plan / ED Course  I have reviewed the triage vital signs and the nursing notes.  Pertinent labs & imaging results that were available during my care of the patient were reviewed by me and considered in my medical decision making (see chart for details).  Clinical Course   Patient is a 80 year old female with multiple medical problems who is currently on Coumadin with a mechanical fall today. As any dizziness, loss of consciousness or head injury. She tripped over a cord on the floor. She was able to get up and ambulate after that without any pain in her hips.  Patient has significant pain in the left wrist with mild deformity. Neurovascularly intact. No elbow or shoulder pain. Patient was going to have her INR  checked on Thursday anyway so we'll check it today. Plain film of the left wrist and forearm pending. Patient given pain control.  12:46 PM Imaging consistent with a distal radius  fracture and ulnar styloid fracture. Patient was immobilized in a splint and given follow-up with Dr. Izora Ribas.  Also INR today is 3.25  Final Clinical Impressions(s) / ED Diagnoses   Final diagnoses:  Distal radius fracture, left, closed, initial encounter  Fracture of ulnar styloid, left, closed, initial encounter    New Prescriptions New Prescriptions   No medications on file     Gwyneth Sprout, MD 08/13/15 1247

## 2015-08-13 NOTE — Progress Notes (Signed)
ED CM noted CM consult: Physician wants CM to talk to patient about referral to specialist. With her insurance does the ER referral work or will she need to get referral from PCP??? In order for insurance to cover  ED CM reviewed pt EPIC united health care response hx and copy of pt insurance card scanned in EPIC- Pt card did not indicate she needs a referral to specialists CM called and spoke to staff at Dr Izora Ribas office who confirms generally pt's insurance card will indicate if a referral is needed to see specialist CM spoke with EDP, Plunkett who states she inquired about this after the pt informed her she went to a specialist office today and was informed she needed a referral This is the reason the pt arrived at [facility] ED per EDP

## 2015-08-14 DIAGNOSIS — S82142D Displaced bicondylar fracture of left tibia, subsequent encounter for closed fracture with routine healing: Secondary | ICD-10-CM | POA: Diagnosis not present

## 2015-08-14 DIAGNOSIS — I482 Chronic atrial fibrillation: Secondary | ICD-10-CM | POA: Diagnosis not present

## 2015-08-14 DIAGNOSIS — I129 Hypertensive chronic kidney disease with stage 1 through stage 4 chronic kidney disease, or unspecified chronic kidney disease: Secondary | ICD-10-CM | POA: Diagnosis not present

## 2015-08-14 DIAGNOSIS — N183 Chronic kidney disease, stage 3 (moderate): Secondary | ICD-10-CM | POA: Diagnosis not present

## 2015-08-14 DIAGNOSIS — Z7901 Long term (current) use of anticoagulants: Secondary | ICD-10-CM | POA: Diagnosis not present

## 2015-08-14 DIAGNOSIS — D62 Acute posthemorrhagic anemia: Secondary | ICD-10-CM | POA: Diagnosis not present

## 2015-08-14 DIAGNOSIS — K219 Gastro-esophageal reflux disease without esophagitis: Secondary | ICD-10-CM | POA: Diagnosis not present

## 2015-08-14 DIAGNOSIS — Z9181 History of falling: Secondary | ICD-10-CM | POA: Diagnosis not present

## 2015-08-15 DIAGNOSIS — I129 Hypertensive chronic kidney disease with stage 1 through stage 4 chronic kidney disease, or unspecified chronic kidney disease: Secondary | ICD-10-CM | POA: Diagnosis not present

## 2015-08-15 DIAGNOSIS — N183 Chronic kidney disease, stage 3 (moderate): Secondary | ICD-10-CM | POA: Diagnosis not present

## 2015-08-15 DIAGNOSIS — S81812A Laceration without foreign body, left lower leg, initial encounter: Secondary | ICD-10-CM | POA: Diagnosis not present

## 2015-08-15 DIAGNOSIS — I4891 Unspecified atrial fibrillation: Secondary | ICD-10-CM | POA: Diagnosis not present

## 2015-08-15 DIAGNOSIS — L97822 Non-pressure chronic ulcer of other part of left lower leg with fat layer exposed: Secondary | ICD-10-CM | POA: Diagnosis not present

## 2015-08-15 DIAGNOSIS — Z87891 Personal history of nicotine dependence: Secondary | ICD-10-CM | POA: Diagnosis not present

## 2015-08-15 DIAGNOSIS — M70862 Other soft tissue disorders related to use, overuse and pressure, left lower leg: Secondary | ICD-10-CM | POA: Diagnosis not present

## 2015-08-15 DIAGNOSIS — L97222 Non-pressure chronic ulcer of left calf with fat layer exposed: Secondary | ICD-10-CM | POA: Diagnosis not present

## 2015-08-15 DIAGNOSIS — Z7901 Long term (current) use of anticoagulants: Secondary | ICD-10-CM | POA: Diagnosis not present

## 2015-08-16 DIAGNOSIS — I482 Chronic atrial fibrillation: Secondary | ICD-10-CM | POA: Diagnosis not present

## 2015-08-16 DIAGNOSIS — I129 Hypertensive chronic kidney disease with stage 1 through stage 4 chronic kidney disease, or unspecified chronic kidney disease: Secondary | ICD-10-CM | POA: Diagnosis not present

## 2015-08-16 DIAGNOSIS — N183 Chronic kidney disease, stage 3 (moderate): Secondary | ICD-10-CM | POA: Diagnosis not present

## 2015-08-16 DIAGNOSIS — D62 Acute posthemorrhagic anemia: Secondary | ICD-10-CM | POA: Diagnosis not present

## 2015-08-16 DIAGNOSIS — Z7901 Long term (current) use of anticoagulants: Secondary | ICD-10-CM | POA: Diagnosis not present

## 2015-08-16 DIAGNOSIS — Z9181 History of falling: Secondary | ICD-10-CM | POA: Diagnosis not present

## 2015-08-16 DIAGNOSIS — K219 Gastro-esophageal reflux disease without esophagitis: Secondary | ICD-10-CM | POA: Diagnosis not present

## 2015-08-16 DIAGNOSIS — S82142D Displaced bicondylar fracture of left tibia, subsequent encounter for closed fracture with routine healing: Secondary | ICD-10-CM | POA: Diagnosis not present

## 2015-08-18 DIAGNOSIS — N183 Chronic kidney disease, stage 3 (moderate): Secondary | ICD-10-CM | POA: Diagnosis not present

## 2015-08-18 DIAGNOSIS — Z7901 Long term (current) use of anticoagulants: Secondary | ICD-10-CM | POA: Diagnosis not present

## 2015-08-18 DIAGNOSIS — D62 Acute posthemorrhagic anemia: Secondary | ICD-10-CM | POA: Diagnosis not present

## 2015-08-18 DIAGNOSIS — I129 Hypertensive chronic kidney disease with stage 1 through stage 4 chronic kidney disease, or unspecified chronic kidney disease: Secondary | ICD-10-CM | POA: Diagnosis not present

## 2015-08-18 DIAGNOSIS — S82142D Displaced bicondylar fracture of left tibia, subsequent encounter for closed fracture with routine healing: Secondary | ICD-10-CM | POA: Diagnosis not present

## 2015-08-18 DIAGNOSIS — K219 Gastro-esophageal reflux disease without esophagitis: Secondary | ICD-10-CM | POA: Diagnosis not present

## 2015-08-18 DIAGNOSIS — I482 Chronic atrial fibrillation: Secondary | ICD-10-CM | POA: Diagnosis not present

## 2015-08-18 DIAGNOSIS — Z9181 History of falling: Secondary | ICD-10-CM | POA: Diagnosis not present

## 2015-08-19 ENCOUNTER — Ambulatory Visit (HOSPITAL_COMMUNITY)
Admission: EM | Admit: 2015-08-19 | Discharge: 2015-08-19 | Disposition: A | Discharge status: Home / Self Care | Payer: Medicare Other | Attending: Emergency Medicine | Admitting: Emergency Medicine

## 2015-08-19 ENCOUNTER — Encounter (HOSPITAL_COMMUNITY): Payer: Self-pay | Admitting: Emergency Medicine

## 2015-08-19 DIAGNOSIS — N390 Urinary tract infection, site not specified: Secondary | ICD-10-CM | POA: Diagnosis not present

## 2015-08-19 LAB — POCT URINALYSIS DIP (DEVICE)
Bilirubin Urine: NEGATIVE
Glucose, UA: NEGATIVE mg/dL
Ketones, ur: NEGATIVE mg/dL
Nitrite: NEGATIVE
Protein, ur: 100 mg/dL — AB
Specific Gravity, Urine: 1.015 (ref 1.005–1.030)
Urobilinogen, UA: 2 mg/dL — ABNORMAL HIGH (ref 0.0–1.0)
pH: 8.5 — ABNORMAL HIGH (ref 5.0–8.0)

## 2015-08-19 MED ORDER — CEPHALEXIN 500 MG PO CAPS: 500.0000 mg | ORAL_CAPSULE | Freq: Four times a day (QID) | ORAL | 0 refills | Status: DC

## 2015-08-19 NOTE — ED Notes (Signed)
Patient in bathroom obtaining urine specimen

## 2015-08-20 DIAGNOSIS — K219 Gastro-esophageal reflux disease without esophagitis: Secondary | ICD-10-CM | POA: Diagnosis not present

## 2015-08-20 DIAGNOSIS — S82142D Displaced bicondylar fracture of left tibia, subsequent encounter for closed fracture with routine healing: Secondary | ICD-10-CM | POA: Diagnosis not present

## 2015-08-20 DIAGNOSIS — Z9181 History of falling: Secondary | ICD-10-CM | POA: Diagnosis not present

## 2015-08-20 DIAGNOSIS — Z7901 Long term (current) use of anticoagulants: Secondary | ICD-10-CM | POA: Diagnosis not present

## 2015-08-20 DIAGNOSIS — I482 Chronic atrial fibrillation: Secondary | ICD-10-CM | POA: Diagnosis not present

## 2015-08-20 DIAGNOSIS — N183 Chronic kidney disease, stage 3 (moderate): Secondary | ICD-10-CM | POA: Diagnosis not present

## 2015-08-20 DIAGNOSIS — S52532A Colles' fracture of left radius, initial encounter for closed fracture: Secondary | ICD-10-CM | POA: Diagnosis not present

## 2015-08-20 DIAGNOSIS — I129 Hypertensive chronic kidney disease with stage 1 through stage 4 chronic kidney disease, or unspecified chronic kidney disease: Secondary | ICD-10-CM | POA: Diagnosis not present

## 2015-08-20 DIAGNOSIS — D62 Acute posthemorrhagic anemia: Secondary | ICD-10-CM | POA: Diagnosis not present

## 2015-08-21 ENCOUNTER — Ambulatory Visit (INDEPENDENT_AMBULATORY_CARE_PROVIDER_SITE_OTHER): Payer: Medicare Other | Admitting: *Deleted

## 2015-08-21 DIAGNOSIS — I482 Chronic atrial fibrillation, unspecified: Secondary | ICD-10-CM

## 2015-08-21 DIAGNOSIS — Z954 Presence of other heart-valve replacement: Secondary | ICD-10-CM

## 2015-08-21 DIAGNOSIS — Z5181 Encounter for therapeutic drug level monitoring: Secondary | ICD-10-CM | POA: Diagnosis not present

## 2015-08-21 DIAGNOSIS — Z952 Presence of prosthetic heart valve: Secondary | ICD-10-CM

## 2015-08-21 LAB — POCT INR: INR: 4.9

## 2015-08-21 NOTE — ED Provider Notes (Signed)
[facility] Provider Note   CSN: [id number] Arrival date & time: 08/19/15  1317  First Provider Contact:  None       History   Chief Complaint Chief Complaint  Patient presents with  . Urinary Tract Infection    HPI [name] is a 80 y.o. female.  The history is provided by the patient. No language interpreter was used.  Urinary Tract Infection   This is a new problem. The problem occurs every urination. The problem has not changed since onset.The quality of the pain is described as aching. The pain is at a severity of 5/10. The pain is moderate. There has been no fever. She is not sexually active. Associated symptoms include urgency. Pertinent negatives include no vomiting. She has tried sitz baths for the symptoms.    Past Medical History:  Diagnosis Date  . Asthma    years ago  . Chronic atrial fibrillation (HCC)   . Dilated cardiomyopathy (HCC)    history of this, now resolved  . Dysphagia    Hx  . Fracture of tibial plateau 05/21/2015  . GERD (gastroesophageal reflux disease)   . GI bleed   . Hemorrhoid 04/2014   bleeding  . History of blood transfusion   . Hypertension   . Microcytic anemia   . Nonsustained ventricular tachycardia (HCC)   . Osteoporosis   . Protein in urine   . RBBB (right bundle branch block)   . Rheumatic mitral valve disease     Patient Active Problem List   Diagnosis Date Noted  . Leg ulcer, left (HCC) 06/27/2015  . High risk social situation 06/27/2015  . Anemia due to blood loss, acute 06/01/2015  . Unstable angina (HCC) 06/01/2015  . Chronic atrial fibrillation (HCC)   . CKD (chronic kidney disease), stage III 05/31/2015  . Tibial plateau fracture 05/22/2015  . Healthcare maintenance 04/30/2015  . Encounter for therapeutic drug monitoring 02/15/2013  . Long term (current) use of anticoagulants 05/03/2010  . RBBB 02/16/2008  . VENTRICULAR TACHYCARDIA 02/16/2008  . History of mitral valve replacement - St Jude  mechanical valve 02/16/2008  . HYPERTENSION, BENIGN SYSTEMIC 03/19/2006  . ATRIAL FIBRILLATION 03/19/2006  . RHINITIS, ALLERGIC 03/19/2006  . REFLUX ESOPHAGITIS 03/19/2006  . OSTEOARTHRITIS, LOWER LEG 03/19/2006  . Osteoporosis 03/19/2006    Past Surgical History:  Procedure Laterality Date  . CHOLECYSTECTOMY    . COLONOSCOPY    . COLONOSCOPY W/ POLYPECTOMY    . MITOMYCIN C APPLICATION Right 05/17/2014   Procedure: MITOMYCIN C APPLICATION;  Surgeon: Chalmers Guest, MD;  Location: [facility] OR;  Service: Ophthalmology;  Laterality: Right;  . MITRAL VALVE REPLACEMENT  1987   St Jude mechanical valve  . TRABECULECTOMY Right 05/17/2014   Procedure: TRABECULECTOMY WITH MITOMYCIN RIGHT EYE;  Surgeon: Chalmers Guest, MD;  Location: [facility] OR;  Service: Ophthalmology;  Laterality: Right;  . TUBAL LIGATION    . US ECHOCARDIOGRAPHY  02/2008, 03/2006, 06/2004    OB History    No data available       Home Medications    Prior to Admission medications   Medication Sig Start Date End Date Taking? Authorizing Provider  Brinzolamide-Brimonidine 1-0.2 % SUSP Place 1 drop into the left eye 3 (three) times daily.     Historical Provider, MD  CARTIA XT 180 MG 24 hr capsule TAKE 1 CAPSULE BY MOUTH DAILY Patient not taking: Reported on 07/11/2015 05/15/15   Marinus Maw, MD  cephALEXin (KEFLEX) 500 MG capsule Take 1  capsule (500 mg total) by mouth 4 (four) times daily. 08/19/15   Elson Areas, PA-C  Cholecalciferol (VITAMIN D) 2000 UNITS tablet Take 2,000 Units by mouth daily.    Historical Provider, MD  DIGOX 125 MCG tablet TAKE 1 TABLET BY MOUTH EVERY DAY 03/07/15   Marinus Maw, MD  hydrochlorothiazide (MICROZIDE) 12.5 MG capsule TAKE 1 CAPSULE(12.5 MG) BY MOUTH DAILY 08/02/15   Nestor Ramp, MD  HYDROcodone-acetaminophen (NORCO/VICODIN) 5-325 MG tablet Take 1-2 tablets by mouth every 6 (six) hours as needed for severe pain. 08/13/15   Gwyneth Sprout, MD  lipase/protease/amylase (CREON) 12000 units CPEP capsule  Take 48,000 Units by mouth 3 (three) times daily before meals.    Historical Provider, MD  metoprolol (LOPRESSOR) 50 MG tablet TAKE 1 1/2 TABLETS BY MOUTH TWICE DAILY Patient taking differently: TAKE 1.5 TABLETS (75mg ) BY MOUTH TWICE DAILY 03/14/15   Marinus Maw, MD  mirabegron ER (MYRBETRIQ) 25 MG TB24 tablet Take 25 mg by mouth daily.    Historical Provider, MD  pantoprazole (PROTONIX) 40 MG tablet Take 40 mg by mouth daily.  08/10/12   Historical Provider, MD  polyethylene glycol (MIRALAX / GLYCOLAX) packet Take 17 g by mouth 2 (two) times daily. 05/25/15   Hillery Hunter Melancon, MD  TRAVATAN Z 0.004 % SOLN ophthalmic solution Place 1 drop into both eyes at bedtime.  04/18/14   Historical Provider, MD  warfarin (COUMADIN) 10 MG tablet Take as directed by coumadin clinic 07/18/15   Marinus Maw, MD    Family History Family History  Problem Relation Age of Onset  . Hypertension Mother   . Cancer Sister     in the Bladder  . Colon cancer Neg Hx     Social History Social History  Substance Use Topics  . Smoking status: Former Games developer  . Smokeless tobacco: Never Used     Comment: quiit 1987  . Alcohol use No     Allergies   Esomeprazole magnesium   Review of Systems Review of Systems  Gastrointestinal: Negative for vomiting.  Genitourinary: Positive for urgency.  All other systems reviewed and are negative.    Physical Exam Updated Vital Signs BP 164/88 (BP Location: Right Arm)   Pulse 85   Temp 98.1 F (36.7 C) (Oral)   Resp 19   SpO2 100%   Physical Exam  Constitutional: She appears well-developed and well-nourished.  HENT:  Head: Normocephalic.  Eyes: EOM are normal. Pupils are equal, round, and reactive to light.  Neck: Normal range of motion. Neck supple.  Cardiovascular: Normal rate.   Pulmonary/Chest: Effort normal.  Abdominal: Soft.  Musculoskeletal: Normal range of motion.  Neurological: She is alert.  Skin: Skin is warm.  Psychiatric: She has a normal  mood and affect.     ED Treatments / Results  Labs (all labs ordered are listed, but only abnormal results are displayed) Labs Reviewed  POCT URINALYSIS DIP (DEVICE) - Abnormal; Notable for the following:       Result Value   Hgb urine dipstick MODERATE (*)    pH 8.5 (*)    Protein, ur 100 (*)    Urobilinogen, UA 2.0 (*)    Leukocytes, UA LARGE (*)    All other components within normal limits    EKG  EKG Interpretation None       Radiology No results found.  Procedures Procedures (including critical care time)  Medications Ordered in ED Medications - No data to display  Initial Impression / Assessment and Plan / ED Course  I have reviewed the triage vital signs and the nursing notes.  Pertinent labs & imaging results that were available during my care of the patient were reviewed by me and considered in my medical decision making (see chart for details).  Clinical Course   Pt given rx for keflex.  Urine culture ordered   Final Clinical Impressions(s) / ED Diagnoses   Final diagnoses:  UTI (lower urinary tract infection)    New Prescriptions Discharge Medication List as of 08/19/2015  3:45 PM    START taking these medications   Details  cephALEXin (KEFLEX) 500 MG capsule Take 1 capsule (500 mg total) by mouth 4 (four) times daily., Starting Sun 08/19/2015, Normal         Lonia Skinner Meadow, PA-C 08/21/15 1623

## 2015-08-22 ENCOUNTER — Encounter (HOSPITAL_BASED_OUTPATIENT_CLINIC_OR_DEPARTMENT_OTHER): Payer: Medicare Other | Attending: Surgery

## 2015-08-22 DIAGNOSIS — S81802A Unspecified open wound, left lower leg, initial encounter: Secondary | ICD-10-CM | POA: Diagnosis not present

## 2015-08-22 DIAGNOSIS — T8189XA Other complications of procedures, not elsewhere classified, initial encounter: Secondary | ICD-10-CM | POA: Insufficient documentation

## 2015-08-22 DIAGNOSIS — I890 Lymphedema, not elsewhere classified: Secondary | ICD-10-CM | POA: Insufficient documentation

## 2015-08-22 DIAGNOSIS — I10 Essential (primary) hypertension: Secondary | ICD-10-CM | POA: Diagnosis not present

## 2015-08-22 DIAGNOSIS — Y838 Other surgical procedures as the cause of abnormal reaction of the patient, or of later complication, without mention of misadventure at the time of the procedure: Secondary | ICD-10-CM | POA: Diagnosis not present

## 2015-08-22 DIAGNOSIS — M70862 Other soft tissue disorders related to use, overuse and pressure, left lower leg: Secondary | ICD-10-CM | POA: Diagnosis not present

## 2015-08-24 DIAGNOSIS — I482 Chronic atrial fibrillation: Secondary | ICD-10-CM | POA: Diagnosis not present

## 2015-08-24 DIAGNOSIS — K219 Gastro-esophageal reflux disease without esophagitis: Secondary | ICD-10-CM | POA: Diagnosis not present

## 2015-08-24 DIAGNOSIS — Z9181 History of falling: Secondary | ICD-10-CM | POA: Diagnosis not present

## 2015-08-24 DIAGNOSIS — D62 Acute posthemorrhagic anemia: Secondary | ICD-10-CM | POA: Diagnosis not present

## 2015-08-24 DIAGNOSIS — N183 Chronic kidney disease, stage 3 (moderate): Secondary | ICD-10-CM | POA: Diagnosis not present

## 2015-08-24 DIAGNOSIS — S82142D Displaced bicondylar fracture of left tibia, subsequent encounter for closed fracture with routine healing: Secondary | ICD-10-CM | POA: Diagnosis not present

## 2015-08-24 DIAGNOSIS — S82145D Nondisplaced bicondylar fracture of left tibia, subsequent encounter for closed fracture with routine healing: Secondary | ICD-10-CM | POA: Diagnosis not present

## 2015-08-24 DIAGNOSIS — I129 Hypertensive chronic kidney disease with stage 1 through stage 4 chronic kidney disease, or unspecified chronic kidney disease: Secondary | ICD-10-CM | POA: Diagnosis not present

## 2015-08-24 DIAGNOSIS — Z7901 Long term (current) use of anticoagulants: Secondary | ICD-10-CM | POA: Diagnosis not present

## 2015-08-25 DIAGNOSIS — S82111A Displaced fracture of right tibial spine, initial encounter for closed fracture: Secondary | ICD-10-CM | POA: Diagnosis not present

## 2015-08-29 DIAGNOSIS — I10 Essential (primary) hypertension: Secondary | ICD-10-CM | POA: Diagnosis not present

## 2015-08-29 DIAGNOSIS — I890 Lymphedema, not elsewhere classified: Secondary | ICD-10-CM | POA: Diagnosis not present

## 2015-08-29 DIAGNOSIS — T8189XA Other complications of procedures, not elsewhere classified, initial encounter: Secondary | ICD-10-CM | POA: Diagnosis not present

## 2015-08-29 DIAGNOSIS — S81802A Unspecified open wound, left lower leg, initial encounter: Secondary | ICD-10-CM | POA: Diagnosis not present

## 2015-08-31 DIAGNOSIS — I482 Chronic atrial fibrillation: Secondary | ICD-10-CM | POA: Diagnosis not present

## 2015-08-31 DIAGNOSIS — S82142D Displaced bicondylar fracture of left tibia, subsequent encounter for closed fracture with routine healing: Secondary | ICD-10-CM | POA: Diagnosis not present

## 2015-08-31 DIAGNOSIS — Z9181 History of falling: Secondary | ICD-10-CM | POA: Diagnosis not present

## 2015-08-31 DIAGNOSIS — N183 Chronic kidney disease, stage 3 (moderate): Secondary | ICD-10-CM | POA: Diagnosis not present

## 2015-08-31 DIAGNOSIS — K219 Gastro-esophageal reflux disease without esophagitis: Secondary | ICD-10-CM | POA: Diagnosis not present

## 2015-08-31 DIAGNOSIS — Z7901 Long term (current) use of anticoagulants: Secondary | ICD-10-CM | POA: Diagnosis not present

## 2015-08-31 DIAGNOSIS — D62 Acute posthemorrhagic anemia: Secondary | ICD-10-CM | POA: Diagnosis not present

## 2015-08-31 DIAGNOSIS — I129 Hypertensive chronic kidney disease with stage 1 through stage 4 chronic kidney disease, or unspecified chronic kidney disease: Secondary | ICD-10-CM | POA: Diagnosis not present

## 2015-09-03 ENCOUNTER — Other Ambulatory Visit: Payer: Self-pay | Admitting: Family Medicine

## 2015-09-03 ENCOUNTER — Ambulatory Visit (INDEPENDENT_AMBULATORY_CARE_PROVIDER_SITE_OTHER): Payer: Medicare Other | Admitting: Pharmacist

## 2015-09-03 DIAGNOSIS — Z5181 Encounter for therapeutic drug level monitoring: Secondary | ICD-10-CM

## 2015-09-03 DIAGNOSIS — I482 Chronic atrial fibrillation, unspecified: Secondary | ICD-10-CM

## 2015-09-03 DIAGNOSIS — Z954 Presence of other heart-valve replacement: Secondary | ICD-10-CM

## 2015-09-03 DIAGNOSIS — Z952 Presence of prosthetic heart valve: Secondary | ICD-10-CM

## 2015-09-03 LAB — POCT INR: INR: 3.7

## 2015-09-04 ENCOUNTER — Non-Acute Institutional Stay (INDEPENDENT_AMBULATORY_CARE_PROVIDER_SITE_OTHER): Payer: Medicare Other | Admitting: Family Medicine

## 2015-09-04 DIAGNOSIS — S82142E Displaced bicondylar fracture of left tibia, subsequent encounter for open fracture type I or II with routine healing: Secondary | ICD-10-CM

## 2015-09-04 NOTE — Progress Notes (Signed)
Note generated in order to register [facility] in order to remove [facility] from [facility] list

## 2015-09-05 DIAGNOSIS — S81802A Unspecified open wound, left lower leg, initial encounter: Secondary | ICD-10-CM | POA: Diagnosis not present

## 2015-09-05 DIAGNOSIS — T8189XA Other complications of procedures, not elsewhere classified, initial encounter: Secondary | ICD-10-CM | POA: Diagnosis not present

## 2015-09-05 DIAGNOSIS — I890 Lymphedema, not elsewhere classified: Secondary | ICD-10-CM | POA: Diagnosis not present

## 2015-09-05 DIAGNOSIS — I10 Essential (primary) hypertension: Secondary | ICD-10-CM | POA: Diagnosis not present

## 2015-09-07 DIAGNOSIS — I482 Chronic atrial fibrillation: Secondary | ICD-10-CM | POA: Diagnosis not present

## 2015-09-07 DIAGNOSIS — I129 Hypertensive chronic kidney disease with stage 1 through stage 4 chronic kidney disease, or unspecified chronic kidney disease: Secondary | ICD-10-CM | POA: Diagnosis not present

## 2015-09-07 DIAGNOSIS — K219 Gastro-esophageal reflux disease without esophagitis: Secondary | ICD-10-CM | POA: Diagnosis not present

## 2015-09-07 DIAGNOSIS — D62 Acute posthemorrhagic anemia: Secondary | ICD-10-CM | POA: Diagnosis not present

## 2015-09-07 DIAGNOSIS — N183 Chronic kidney disease, stage 3 (moderate): Secondary | ICD-10-CM | POA: Diagnosis not present

## 2015-09-07 DIAGNOSIS — S82142D Displaced bicondylar fracture of left tibia, subsequent encounter for closed fracture with routine healing: Secondary | ICD-10-CM | POA: Diagnosis not present

## 2015-09-07 DIAGNOSIS — Z9181 History of falling: Secondary | ICD-10-CM | POA: Diagnosis not present

## 2015-09-07 DIAGNOSIS — Z7901 Long term (current) use of anticoagulants: Secondary | ICD-10-CM | POA: Diagnosis not present

## 2015-09-10 DIAGNOSIS — S52532D Colles' fracture of left radius, subsequent encounter for closed fracture with routine healing: Secondary | ICD-10-CM | POA: Diagnosis not present

## 2015-09-12 DIAGNOSIS — I10 Essential (primary) hypertension: Secondary | ICD-10-CM | POA: Diagnosis not present

## 2015-09-12 DIAGNOSIS — I890 Lymphedema, not elsewhere classified: Secondary | ICD-10-CM | POA: Diagnosis not present

## 2015-09-12 DIAGNOSIS — T8189XA Other complications of procedures, not elsewhere classified, initial encounter: Secondary | ICD-10-CM | POA: Diagnosis not present

## 2015-09-12 DIAGNOSIS — L97822 Non-pressure chronic ulcer of other part of left lower leg with fat layer exposed: Secondary | ICD-10-CM | POA: Diagnosis not present

## 2015-09-14 DIAGNOSIS — Z9181 History of falling: Secondary | ICD-10-CM | POA: Diagnosis not present

## 2015-09-14 DIAGNOSIS — N183 Chronic kidney disease, stage 3 (moderate): Secondary | ICD-10-CM | POA: Diagnosis not present

## 2015-09-14 DIAGNOSIS — I129 Hypertensive chronic kidney disease with stage 1 through stage 4 chronic kidney disease, or unspecified chronic kidney disease: Secondary | ICD-10-CM | POA: Diagnosis not present

## 2015-09-14 DIAGNOSIS — K219 Gastro-esophageal reflux disease without esophagitis: Secondary | ICD-10-CM | POA: Diagnosis not present

## 2015-09-14 DIAGNOSIS — D62 Acute posthemorrhagic anemia: Secondary | ICD-10-CM | POA: Diagnosis not present

## 2015-09-14 DIAGNOSIS — S82142D Displaced bicondylar fracture of left tibia, subsequent encounter for closed fracture with routine healing: Secondary | ICD-10-CM | POA: Diagnosis not present

## 2015-09-14 DIAGNOSIS — I482 Chronic atrial fibrillation: Secondary | ICD-10-CM | POA: Diagnosis not present

## 2015-09-14 DIAGNOSIS — Z7901 Long term (current) use of anticoagulants: Secondary | ICD-10-CM | POA: Diagnosis not present

## 2015-09-17 ENCOUNTER — Ambulatory Visit (INDEPENDENT_AMBULATORY_CARE_PROVIDER_SITE_OTHER): Payer: Medicare Other | Admitting: *Deleted

## 2015-09-17 DIAGNOSIS — Z5181 Encounter for therapeutic drug level monitoring: Secondary | ICD-10-CM | POA: Diagnosis not present

## 2015-09-17 DIAGNOSIS — Z954 Presence of other heart-valve replacement: Secondary | ICD-10-CM

## 2015-09-17 DIAGNOSIS — I482 Chronic atrial fibrillation, unspecified: Secondary | ICD-10-CM

## 2015-09-17 DIAGNOSIS — Z952 Presence of prosthetic heart valve: Secondary | ICD-10-CM

## 2015-09-17 LAB — POCT INR: INR: 2.7

## 2015-09-19 DIAGNOSIS — T8189XA Other complications of procedures, not elsewhere classified, initial encounter: Secondary | ICD-10-CM | POA: Diagnosis not present

## 2015-09-19 DIAGNOSIS — I499 Cardiac arrhythmia, unspecified: Secondary | ICD-10-CM | POA: Diagnosis not present

## 2015-09-19 DIAGNOSIS — I890 Lymphedema, not elsewhere classified: Secondary | ICD-10-CM | POA: Diagnosis not present

## 2015-09-19 DIAGNOSIS — I10 Essential (primary) hypertension: Secondary | ICD-10-CM | POA: Diagnosis not present

## 2015-09-19 DIAGNOSIS — S81802A Unspecified open wound, left lower leg, initial encounter: Secondary | ICD-10-CM | POA: Diagnosis not present

## 2015-09-25 DIAGNOSIS — S82111A Displaced fracture of right tibial spine, initial encounter for closed fracture: Secondary | ICD-10-CM | POA: Diagnosis not present

## 2015-09-26 ENCOUNTER — Encounter (HOSPITAL_BASED_OUTPATIENT_CLINIC_OR_DEPARTMENT_OTHER): Payer: Medicare Other | Attending: Surgery

## 2015-09-26 DIAGNOSIS — I129 Hypertensive chronic kidney disease with stage 1 through stage 4 chronic kidney disease, or unspecified chronic kidney disease: Secondary | ICD-10-CM | POA: Diagnosis not present

## 2015-09-26 DIAGNOSIS — Z7901 Long term (current) use of anticoagulants: Secondary | ICD-10-CM | POA: Diagnosis not present

## 2015-09-26 DIAGNOSIS — L97821 Non-pressure chronic ulcer of other part of left lower leg limited to breakdown of skin: Secondary | ICD-10-CM | POA: Diagnosis not present

## 2015-09-26 DIAGNOSIS — S91302A Unspecified open wound, left foot, initial encounter: Secondary | ICD-10-CM | POA: Diagnosis not present

## 2015-09-26 DIAGNOSIS — N183 Chronic kidney disease, stage 3 (moderate): Secondary | ICD-10-CM | POA: Diagnosis not present

## 2015-09-26 DIAGNOSIS — Z87891 Personal history of nicotine dependence: Secondary | ICD-10-CM | POA: Insufficient documentation

## 2015-09-26 DIAGNOSIS — L97521 Non-pressure chronic ulcer of other part of left foot limited to breakdown of skin: Secondary | ICD-10-CM | POA: Insufficient documentation

## 2015-09-26 DIAGNOSIS — I4891 Unspecified atrial fibrillation: Secondary | ICD-10-CM | POA: Insufficient documentation

## 2015-09-26 DIAGNOSIS — S81802A Unspecified open wound, left lower leg, initial encounter: Secondary | ICD-10-CM | POA: Diagnosis not present

## 2015-09-26 DIAGNOSIS — I499 Cardiac arrhythmia, unspecified: Secondary | ICD-10-CM | POA: Diagnosis not present

## 2015-09-26 DIAGNOSIS — I10 Essential (primary) hypertension: Secondary | ICD-10-CM | POA: Diagnosis not present

## 2015-09-30 ENCOUNTER — Other Ambulatory Visit: Payer: Self-pay | Admitting: Family Medicine

## 2015-10-02 DIAGNOSIS — I129 Hypertensive chronic kidney disease with stage 1 through stage 4 chronic kidney disease, or unspecified chronic kidney disease: Secondary | ICD-10-CM | POA: Diagnosis not present

## 2015-10-02 DIAGNOSIS — I4891 Unspecified atrial fibrillation: Secondary | ICD-10-CM | POA: Diagnosis not present

## 2015-10-02 DIAGNOSIS — L97521 Non-pressure chronic ulcer of other part of left foot limited to breakdown of skin: Secondary | ICD-10-CM | POA: Diagnosis not present

## 2015-10-02 DIAGNOSIS — Z7901 Long term (current) use of anticoagulants: Secondary | ICD-10-CM | POA: Diagnosis not present

## 2015-10-02 DIAGNOSIS — Z87891 Personal history of nicotine dependence: Secondary | ICD-10-CM | POA: Diagnosis not present

## 2015-10-02 DIAGNOSIS — N183 Chronic kidney disease, stage 3 (moderate): Secondary | ICD-10-CM | POA: Diagnosis not present

## 2015-10-02 DIAGNOSIS — L97821 Non-pressure chronic ulcer of other part of left lower leg limited to breakdown of skin: Secondary | ICD-10-CM | POA: Diagnosis not present

## 2015-10-09 DIAGNOSIS — N183 Chronic kidney disease, stage 3 (moderate): Secondary | ICD-10-CM | POA: Diagnosis not present

## 2015-10-09 DIAGNOSIS — Z7901 Long term (current) use of anticoagulants: Secondary | ICD-10-CM | POA: Diagnosis not present

## 2015-10-09 DIAGNOSIS — I129 Hypertensive chronic kidney disease with stage 1 through stage 4 chronic kidney disease, or unspecified chronic kidney disease: Secondary | ICD-10-CM | POA: Diagnosis not present

## 2015-10-09 DIAGNOSIS — I4891 Unspecified atrial fibrillation: Secondary | ICD-10-CM | POA: Diagnosis not present

## 2015-10-09 DIAGNOSIS — Z87891 Personal history of nicotine dependence: Secondary | ICD-10-CM | POA: Diagnosis not present

## 2015-10-09 DIAGNOSIS — L97821 Non-pressure chronic ulcer of other part of left lower leg limited to breakdown of skin: Secondary | ICD-10-CM | POA: Diagnosis not present

## 2015-10-09 DIAGNOSIS — L97521 Non-pressure chronic ulcer of other part of left foot limited to breakdown of skin: Secondary | ICD-10-CM | POA: Diagnosis not present

## 2015-10-12 ENCOUNTER — Ambulatory Visit (INDEPENDENT_AMBULATORY_CARE_PROVIDER_SITE_OTHER): Payer: Medicare Other | Admitting: *Deleted

## 2015-10-12 DIAGNOSIS — I482 Chronic atrial fibrillation, unspecified: Secondary | ICD-10-CM

## 2015-10-12 DIAGNOSIS — Z5181 Encounter for therapeutic drug level monitoring: Secondary | ICD-10-CM | POA: Diagnosis not present

## 2015-10-12 DIAGNOSIS — Z952 Presence of prosthetic heart valve: Secondary | ICD-10-CM | POA: Diagnosis not present

## 2015-10-12 LAB — POCT INR: INR: 2

## 2015-10-16 DIAGNOSIS — Z87891 Personal history of nicotine dependence: Secondary | ICD-10-CM | POA: Diagnosis not present

## 2015-10-16 DIAGNOSIS — I129 Hypertensive chronic kidney disease with stage 1 through stage 4 chronic kidney disease, or unspecified chronic kidney disease: Secondary | ICD-10-CM | POA: Diagnosis not present

## 2015-10-16 DIAGNOSIS — L97521 Non-pressure chronic ulcer of other part of left foot limited to breakdown of skin: Secondary | ICD-10-CM | POA: Diagnosis not present

## 2015-10-16 DIAGNOSIS — S91302A Unspecified open wound, left foot, initial encounter: Secondary | ICD-10-CM | POA: Diagnosis not present

## 2015-10-16 DIAGNOSIS — S81802A Unspecified open wound, left lower leg, initial encounter: Secondary | ICD-10-CM | POA: Diagnosis not present

## 2015-10-16 DIAGNOSIS — M70862 Other soft tissue disorders related to use, overuse and pressure, left lower leg: Secondary | ICD-10-CM | POA: Diagnosis not present

## 2015-10-16 DIAGNOSIS — N183 Chronic kidney disease, stage 3 (moderate): Secondary | ICD-10-CM | POA: Diagnosis not present

## 2015-10-16 DIAGNOSIS — I4891 Unspecified atrial fibrillation: Secondary | ICD-10-CM | POA: Diagnosis not present

## 2015-10-16 DIAGNOSIS — L97821 Non-pressure chronic ulcer of other part of left lower leg limited to breakdown of skin: Secondary | ICD-10-CM | POA: Diagnosis not present

## 2015-10-16 DIAGNOSIS — Z7901 Long term (current) use of anticoagulants: Secondary | ICD-10-CM | POA: Diagnosis not present

## 2015-10-23 ENCOUNTER — Encounter (HOSPITAL_BASED_OUTPATIENT_CLINIC_OR_DEPARTMENT_OTHER): Payer: Medicare Other | Attending: Surgery

## 2015-10-23 DIAGNOSIS — I890 Lymphedema, not elsewhere classified: Secondary | ICD-10-CM | POA: Insufficient documentation

## 2015-10-23 DIAGNOSIS — N183 Chronic kidney disease, stage 3 (moderate): Secondary | ICD-10-CM | POA: Insufficient documentation

## 2015-10-23 DIAGNOSIS — X58XXXA Exposure to other specified factors, initial encounter: Secondary | ICD-10-CM | POA: Insufficient documentation

## 2015-10-23 DIAGNOSIS — Z7901 Long term (current) use of anticoagulants: Secondary | ICD-10-CM | POA: Insufficient documentation

## 2015-10-23 DIAGNOSIS — Z87891 Personal history of nicotine dependence: Secondary | ICD-10-CM | POA: Insufficient documentation

## 2015-10-23 DIAGNOSIS — L97221 Non-pressure chronic ulcer of left calf limited to breakdown of skin: Secondary | ICD-10-CM | POA: Insufficient documentation

## 2015-10-23 DIAGNOSIS — I129 Hypertensive chronic kidney disease with stage 1 through stage 4 chronic kidney disease, or unspecified chronic kidney disease: Secondary | ICD-10-CM | POA: Insufficient documentation

## 2015-10-23 DIAGNOSIS — S91302A Unspecified open wound, left foot, initial encounter: Secondary | ICD-10-CM | POA: Insufficient documentation

## 2015-10-23 DIAGNOSIS — I4891 Unspecified atrial fibrillation: Secondary | ICD-10-CM | POA: Insufficient documentation

## 2015-10-24 ENCOUNTER — Encounter: Payer: Self-pay | Admitting: Family Medicine

## 2015-10-24 ENCOUNTER — Ambulatory Visit (INDEPENDENT_AMBULATORY_CARE_PROVIDER_SITE_OTHER): Payer: Medicare Other | Admitting: Family Medicine

## 2015-10-24 ENCOUNTER — Ambulatory Visit: Payer: Medicare Other | Admitting: Family Medicine

## 2015-10-24 ENCOUNTER — Ambulatory Visit (INDEPENDENT_AMBULATORY_CARE_PROVIDER_SITE_OTHER): Payer: Medicare Other | Admitting: *Deleted

## 2015-10-24 VITALS — BP 162/88 | HR 80 | Temp 98.4°F | Wt 164.8 lb

## 2015-10-24 DIAGNOSIS — I10 Essential (primary) hypertension: Secondary | ICD-10-CM

## 2015-10-24 DIAGNOSIS — Z23 Encounter for immunization: Secondary | ICD-10-CM

## 2015-10-24 DIAGNOSIS — Z5181 Encounter for therapeutic drug level monitoring: Secondary | ICD-10-CM | POA: Diagnosis not present

## 2015-10-24 DIAGNOSIS — I482 Chronic atrial fibrillation, unspecified: Secondary | ICD-10-CM

## 2015-10-24 DIAGNOSIS — Z952 Presence of prosthetic heart valve: Secondary | ICD-10-CM

## 2015-10-24 DIAGNOSIS — R69 Illness, unspecified: Secondary | ICD-10-CM

## 2015-10-24 LAB — POCT INR: INR: 3

## 2015-10-24 LAB — COMPLETE METABOLIC PANEL WITH GFR
ALT: 9 U/L (ref 6–29)
AST: 16 U/L (ref 10–35)
Albumin: 4.2 g/dL (ref 3.6–5.1)
Alkaline Phosphatase: 47 U/L (ref 33–130)
BUN: 25 mg/dL (ref 7–25)
CO2: 25 mmol/L (ref 20–31)
Calcium: 9.7 mg/dL (ref 8.6–10.4)
Chloride: 102 mmol/L (ref 98–110)
Creat: 1.26 mg/dL — ABNORMAL HIGH (ref 0.60–0.93)
GFR, Est African American: 47 mL/min — ABNORMAL LOW (ref >=60)
GFR, Est Non African American: 41 mL/min — ABNORMAL LOW (ref >=60)
Glucose, Bld: 88 mg/dL (ref 65–99)
Potassium: 3.4 mmol/L — ABNORMAL LOW (ref 3.5–5.3)
Sodium: 140 mmol/L (ref 135–146)
Total Bilirubin: 0.9 mg/dL (ref 0.2–1.2)
Total Protein: 8.1 g/dL (ref 6.1–8.1)

## 2015-10-24 MED ORDER — HYDROCHLOROTHIAZIDE 25 MG PO TABS: 25.0000 mg | ORAL_TABLET | Freq: Every day | ORAL | 3 refills | Status: DC

## 2015-10-24 NOTE — Patient Instructions (Signed)
Thank you so much for coming to visit today! We will increase her hydrochlorothiazide from 12.5 mg daily to 25 mg daily. You may take 2 tablets of your current bottle (two 12.5mg  tablets) until you pick up your new bottle, at which time you will just take 1 tablet daily. We also checked some labs today and we will mail you a letter with the results. Please check your blood pressure once a day and write down. Please follow-up in 2 weeks so we can recheck your blood pressure. Please bring your blood pressures to your next visit.  Dr. Caroleen Hammanumley

## 2015-10-25 DIAGNOSIS — S82111A Displaced fracture of right tibial spine, initial encounter for closed fracture: Secondary | ICD-10-CM | POA: Diagnosis not present

## 2015-10-26 DIAGNOSIS — H25813 Combined forms of age-related cataract, bilateral: Secondary | ICD-10-CM | POA: Diagnosis not present

## 2015-10-26 DIAGNOSIS — H4089 Other specified glaucoma: Secondary | ICD-10-CM | POA: Diagnosis not present

## 2015-10-28 NOTE — Progress Notes (Signed)
Subjective:     Patient ID: Bing MatterSally M Jahnke, female   DOB: [birth year], 80 y.o.   MRN: [id number]  HPI Mrs. Maisie Fushomas is a 80yo female presenting today for follow up of blood pressure. - Currently taking Cartia, Digox, HCTZ, and Metoprolol. Reports taking as prescribed. - Checks blood pressure at home, but unable to remember what it was. Does not write down her pressures. - Denies headache, chest pain, shortness of breath, weakness, dizziness - Has follow up with Cardiology scheduled. Going to see Ophthalmology on Friday (10/6) - Former Smoker   Review of Systems Per HPI    Objective:   Physical Exam  Constitutional: She appears well-developed and well-nourished. No distress.  HENT:  Head: Normocephalic and atraumatic.  Cardiovascular: Normal rate and regular rhythm.   Murmur heard. Pulmonary/Chest: Effort normal. No respiratory distress. She has no wheezes.  Abdominal: She exhibits no distension. There is no tenderness.  Psychiatric: She has a normal mood and affect. Her behavior is normal.      Assessment and Plan:     HYPERTENSION, BENIGN SYSTEMIC - Elevated to 161/80. Denies symptoms. - Increase HCTZ to 25mg  dosing.  - Check CMP today - Check blood pressure once daily and write down results. To bring log to Cardiologist and to her next visit. - Follow up with Cardiology as scheduled - Follow up in 2 weeks to recheck blood pressure and adjust medications.

## 2015-10-28 NOTE — Assessment & Plan Note (Addendum)
-   Elevated to 161/80. Denies symptoms. - Increase HCTZ to 25mg  dosing.  - Check CMP today - Check blood pressure once daily and write down results. To bring log to Cardiologist and to her next visit. - Follow up with Cardiology as scheduled - Follow up in 2 weeks to recheck blood pressure and adjust medications.

## 2015-10-29 ENCOUNTER — Encounter: Payer: Self-pay | Admitting: Family Medicine

## 2015-10-30 DIAGNOSIS — L97222 Non-pressure chronic ulcer of left calf with fat layer exposed: Secondary | ICD-10-CM | POA: Diagnosis not present

## 2015-10-30 DIAGNOSIS — L97221 Non-pressure chronic ulcer of left calf limited to breakdown of skin: Secondary | ICD-10-CM | POA: Diagnosis not present

## 2015-10-30 DIAGNOSIS — I4891 Unspecified atrial fibrillation: Secondary | ICD-10-CM | POA: Diagnosis not present

## 2015-10-30 DIAGNOSIS — N183 Chronic kidney disease, stage 3 (moderate): Secondary | ICD-10-CM | POA: Diagnosis not present

## 2015-10-30 DIAGNOSIS — Z87891 Personal history of nicotine dependence: Secondary | ICD-10-CM | POA: Diagnosis not present

## 2015-10-30 DIAGNOSIS — I890 Lymphedema, not elsewhere classified: Secondary | ICD-10-CM | POA: Diagnosis not present

## 2015-10-30 DIAGNOSIS — I129 Hypertensive chronic kidney disease with stage 1 through stage 4 chronic kidney disease, or unspecified chronic kidney disease: Secondary | ICD-10-CM | POA: Diagnosis not present

## 2015-10-30 DIAGNOSIS — Z7901 Long term (current) use of anticoagulants: Secondary | ICD-10-CM | POA: Diagnosis not present

## 2015-10-30 DIAGNOSIS — S91302A Unspecified open wound, left foot, initial encounter: Secondary | ICD-10-CM | POA: Diagnosis not present

## 2015-10-30 DIAGNOSIS — X58XXXA Exposure to other specified factors, initial encounter: Secondary | ICD-10-CM | POA: Diagnosis not present

## 2015-11-06 DIAGNOSIS — I890 Lymphedema, not elsewhere classified: Secondary | ICD-10-CM | POA: Diagnosis not present

## 2015-11-06 DIAGNOSIS — Z7901 Long term (current) use of anticoagulants: Secondary | ICD-10-CM | POA: Diagnosis not present

## 2015-11-06 DIAGNOSIS — S81802A Unspecified open wound, left lower leg, initial encounter: Secondary | ICD-10-CM | POA: Diagnosis not present

## 2015-11-06 DIAGNOSIS — S91302A Unspecified open wound, left foot, initial encounter: Secondary | ICD-10-CM | POA: Diagnosis not present

## 2015-11-06 DIAGNOSIS — N183 Chronic kidney disease, stage 3 (moderate): Secondary | ICD-10-CM | POA: Diagnosis not present

## 2015-11-06 DIAGNOSIS — L97221 Non-pressure chronic ulcer of left calf limited to breakdown of skin: Secondary | ICD-10-CM | POA: Diagnosis not present

## 2015-11-06 DIAGNOSIS — I4891 Unspecified atrial fibrillation: Secondary | ICD-10-CM | POA: Diagnosis not present

## 2015-11-06 DIAGNOSIS — Z87891 Personal history of nicotine dependence: Secondary | ICD-10-CM | POA: Diagnosis not present

## 2015-11-06 DIAGNOSIS — I129 Hypertensive chronic kidney disease with stage 1 through stage 4 chronic kidney disease, or unspecified chronic kidney disease: Secondary | ICD-10-CM | POA: Diagnosis not present

## 2015-11-07 DIAGNOSIS — S81802A Unspecified open wound, left lower leg, initial encounter: Secondary | ICD-10-CM | POA: Diagnosis not present

## 2015-11-13 DIAGNOSIS — L97222 Non-pressure chronic ulcer of left calf with fat layer exposed: Secondary | ICD-10-CM | POA: Diagnosis not present

## 2015-11-13 DIAGNOSIS — N183 Chronic kidney disease, stage 3 (moderate): Secondary | ICD-10-CM | POA: Diagnosis not present

## 2015-11-13 DIAGNOSIS — Z87891 Personal history of nicotine dependence: Secondary | ICD-10-CM | POA: Diagnosis not present

## 2015-11-13 DIAGNOSIS — I890 Lymphedema, not elsewhere classified: Secondary | ICD-10-CM | POA: Diagnosis not present

## 2015-11-13 DIAGNOSIS — I129 Hypertensive chronic kidney disease with stage 1 through stage 4 chronic kidney disease, or unspecified chronic kidney disease: Secondary | ICD-10-CM | POA: Diagnosis not present

## 2015-11-13 DIAGNOSIS — I4891 Unspecified atrial fibrillation: Secondary | ICD-10-CM | POA: Diagnosis not present

## 2015-11-13 DIAGNOSIS — Z7901 Long term (current) use of anticoagulants: Secondary | ICD-10-CM | POA: Diagnosis not present

## 2015-11-13 DIAGNOSIS — S81802A Unspecified open wound, left lower leg, initial encounter: Secondary | ICD-10-CM | POA: Diagnosis not present

## 2015-11-13 DIAGNOSIS — L97221 Non-pressure chronic ulcer of left calf limited to breakdown of skin: Secondary | ICD-10-CM | POA: Diagnosis not present

## 2015-11-13 DIAGNOSIS — S91302A Unspecified open wound, left foot, initial encounter: Secondary | ICD-10-CM | POA: Diagnosis not present

## 2015-11-15 ENCOUNTER — Ambulatory Visit (INDEPENDENT_AMBULATORY_CARE_PROVIDER_SITE_OTHER): Payer: Medicare Other | Admitting: *Deleted

## 2015-11-15 DIAGNOSIS — Z952 Presence of prosthetic heart valve: Secondary | ICD-10-CM | POA: Diagnosis not present

## 2015-11-15 DIAGNOSIS — Z5181 Encounter for therapeutic drug level monitoring: Secondary | ICD-10-CM | POA: Diagnosis not present

## 2015-11-15 DIAGNOSIS — I482 Chronic atrial fibrillation, unspecified: Secondary | ICD-10-CM

## 2015-11-15 LAB — POCT INR: INR: 2.9

## 2015-11-16 DIAGNOSIS — N3080 Other cystitis without hematuria: Secondary | ICD-10-CM | POA: Diagnosis not present

## 2015-11-16 DIAGNOSIS — R350 Frequency of micturition: Secondary | ICD-10-CM | POA: Diagnosis not present

## 2015-11-16 DIAGNOSIS — R3915 Urgency of urination: Secondary | ICD-10-CM | POA: Diagnosis not present

## 2015-11-20 DIAGNOSIS — I129 Hypertensive chronic kidney disease with stage 1 through stage 4 chronic kidney disease, or unspecified chronic kidney disease: Secondary | ICD-10-CM | POA: Diagnosis not present

## 2015-11-20 DIAGNOSIS — N183 Chronic kidney disease, stage 3 (moderate): Secondary | ICD-10-CM | POA: Diagnosis not present

## 2015-11-20 DIAGNOSIS — I890 Lymphedema, not elsewhere classified: Secondary | ICD-10-CM | POA: Diagnosis not present

## 2015-11-20 DIAGNOSIS — S81802A Unspecified open wound, left lower leg, initial encounter: Secondary | ICD-10-CM | POA: Diagnosis not present

## 2015-11-20 DIAGNOSIS — S91302A Unspecified open wound, left foot, initial encounter: Secondary | ICD-10-CM | POA: Diagnosis not present

## 2015-11-20 DIAGNOSIS — Z7901 Long term (current) use of anticoagulants: Secondary | ICD-10-CM | POA: Diagnosis not present

## 2015-11-20 DIAGNOSIS — I4891 Unspecified atrial fibrillation: Secondary | ICD-10-CM | POA: Diagnosis not present

## 2015-11-20 DIAGNOSIS — L97221 Non-pressure chronic ulcer of left calf limited to breakdown of skin: Secondary | ICD-10-CM | POA: Diagnosis not present

## 2015-11-20 DIAGNOSIS — Z87891 Personal history of nicotine dependence: Secondary | ICD-10-CM | POA: Diagnosis not present

## 2015-11-21 DIAGNOSIS — S81802A Unspecified open wound, left lower leg, initial encounter: Secondary | ICD-10-CM | POA: Diagnosis not present

## 2015-11-23 DIAGNOSIS — H04123 Dry eye syndrome of bilateral lacrimal glands: Secondary | ICD-10-CM | POA: Diagnosis not present

## 2015-11-25 DIAGNOSIS — S82111A Displaced fracture of right tibial spine, initial encounter for closed fracture: Secondary | ICD-10-CM | POA: Diagnosis not present

## 2015-11-27 ENCOUNTER — Encounter (HOSPITAL_BASED_OUTPATIENT_CLINIC_OR_DEPARTMENT_OTHER): Payer: Medicare Other | Attending: Surgery

## 2015-11-27 DIAGNOSIS — L97222 Non-pressure chronic ulcer of left calf with fat layer exposed: Secondary | ICD-10-CM | POA: Insufficient documentation

## 2015-11-27 DIAGNOSIS — Z7901 Long term (current) use of anticoagulants: Secondary | ICD-10-CM | POA: Diagnosis not present

## 2015-11-27 DIAGNOSIS — Z87891 Personal history of nicotine dependence: Secondary | ICD-10-CM | POA: Insufficient documentation

## 2015-11-27 DIAGNOSIS — I129 Hypertensive chronic kidney disease with stage 1 through stage 4 chronic kidney disease, or unspecified chronic kidney disease: Secondary | ICD-10-CM | POA: Insufficient documentation

## 2015-11-27 DIAGNOSIS — S81802D Unspecified open wound, left lower leg, subsequent encounter: Secondary | ICD-10-CM | POA: Diagnosis not present

## 2015-11-27 DIAGNOSIS — I890 Lymphedema, not elsewhere classified: Secondary | ICD-10-CM | POA: Diagnosis not present

## 2015-11-27 DIAGNOSIS — I10 Essential (primary) hypertension: Secondary | ICD-10-CM | POA: Diagnosis not present

## 2015-11-27 DIAGNOSIS — N183 Chronic kidney disease, stage 3 (moderate): Secondary | ICD-10-CM | POA: Diagnosis not present

## 2015-11-28 DIAGNOSIS — S81802A Unspecified open wound, left lower leg, initial encounter: Secondary | ICD-10-CM | POA: Diagnosis not present

## 2015-12-04 DIAGNOSIS — I10 Essential (primary) hypertension: Secondary | ICD-10-CM | POA: Diagnosis not present

## 2015-12-04 DIAGNOSIS — N183 Chronic kidney disease, stage 3 (moderate): Secondary | ICD-10-CM | POA: Diagnosis not present

## 2015-12-04 DIAGNOSIS — Y29XXXA Contact with blunt object, undetermined intent, initial encounter: Secondary | ICD-10-CM | POA: Diagnosis not present

## 2015-12-04 DIAGNOSIS — I890 Lymphedema, not elsewhere classified: Secondary | ICD-10-CM | POA: Diagnosis not present

## 2015-12-04 DIAGNOSIS — S81802A Unspecified open wound, left lower leg, initial encounter: Secondary | ICD-10-CM | POA: Diagnosis not present

## 2015-12-04 DIAGNOSIS — I129 Hypertensive chronic kidney disease with stage 1 through stage 4 chronic kidney disease, or unspecified chronic kidney disease: Secondary | ICD-10-CM | POA: Diagnosis not present

## 2015-12-04 DIAGNOSIS — Z87891 Personal history of nicotine dependence: Secondary | ICD-10-CM | POA: Diagnosis not present

## 2015-12-04 DIAGNOSIS — L97222 Non-pressure chronic ulcer of left calf with fat layer exposed: Secondary | ICD-10-CM | POA: Diagnosis not present

## 2015-12-04 DIAGNOSIS — Z7901 Long term (current) use of anticoagulants: Secondary | ICD-10-CM | POA: Diagnosis not present

## 2015-12-04 DIAGNOSIS — M70862 Other soft tissue disorders related to use, overuse and pressure, left lower leg: Secondary | ICD-10-CM | POA: Diagnosis not present

## 2015-12-05 DIAGNOSIS — S81802A Unspecified open wound, left lower leg, initial encounter: Secondary | ICD-10-CM | POA: Diagnosis not present

## 2015-12-11 DIAGNOSIS — Y29XXXA Contact with blunt object, undetermined intent, initial encounter: Secondary | ICD-10-CM | POA: Diagnosis not present

## 2015-12-11 DIAGNOSIS — I129 Hypertensive chronic kidney disease with stage 1 through stage 4 chronic kidney disease, or unspecified chronic kidney disease: Secondary | ICD-10-CM | POA: Diagnosis not present

## 2015-12-11 DIAGNOSIS — Z87891 Personal history of nicotine dependence: Secondary | ICD-10-CM | POA: Diagnosis not present

## 2015-12-11 DIAGNOSIS — N183 Chronic kidney disease, stage 3 (moderate): Secondary | ICD-10-CM | POA: Diagnosis not present

## 2015-12-11 DIAGNOSIS — I10 Essential (primary) hypertension: Secondary | ICD-10-CM | POA: Diagnosis not present

## 2015-12-11 DIAGNOSIS — I890 Lymphedema, not elsewhere classified: Secondary | ICD-10-CM | POA: Diagnosis not present

## 2015-12-11 DIAGNOSIS — Z7901 Long term (current) use of anticoagulants: Secondary | ICD-10-CM | POA: Diagnosis not present

## 2015-12-11 DIAGNOSIS — L97222 Non-pressure chronic ulcer of left calf with fat layer exposed: Secondary | ICD-10-CM | POA: Diagnosis not present

## 2015-12-20 ENCOUNTER — Ambulatory Visit (INDEPENDENT_AMBULATORY_CARE_PROVIDER_SITE_OTHER): Payer: Medicare Other | Admitting: *Deleted

## 2015-12-20 DIAGNOSIS — Z952 Presence of prosthetic heart valve: Secondary | ICD-10-CM | POA: Diagnosis not present

## 2015-12-20 DIAGNOSIS — I482 Chronic atrial fibrillation, unspecified: Secondary | ICD-10-CM

## 2015-12-20 DIAGNOSIS — Z5181 Encounter for therapeutic drug level monitoring: Secondary | ICD-10-CM

## 2015-12-20 LAB — POCT INR: INR: 4.8

## 2015-12-25 DIAGNOSIS — S82111A Displaced fracture of right tibial spine, initial encounter for closed fracture: Secondary | ICD-10-CM | POA: Diagnosis not present

## 2015-12-27 ENCOUNTER — Other Ambulatory Visit: Payer: Self-pay | Admitting: Internal Medicine

## 2015-12-27 DIAGNOSIS — H4089 Other specified glaucoma: Secondary | ICD-10-CM | POA: Diagnosis not present

## 2016-01-02 ENCOUNTER — Ambulatory Visit: Payer: Medicare Other | Admitting: Family Medicine

## 2016-01-07 ENCOUNTER — Ambulatory Visit (INDEPENDENT_AMBULATORY_CARE_PROVIDER_SITE_OTHER): Payer: Medicare Other | Admitting: *Deleted

## 2016-01-07 DIAGNOSIS — I482 Chronic atrial fibrillation, unspecified: Secondary | ICD-10-CM

## 2016-01-07 DIAGNOSIS — Z5181 Encounter for therapeutic drug level monitoring: Secondary | ICD-10-CM

## 2016-01-07 DIAGNOSIS — Z952 Presence of prosthetic heart valve: Secondary | ICD-10-CM | POA: Diagnosis not present

## 2016-01-07 LAB — POCT INR: INR: 3

## 2016-01-10 DIAGNOSIS — N3281 Overactive bladder: Secondary | ICD-10-CM | POA: Diagnosis not present

## 2016-01-10 DIAGNOSIS — N183 Chronic kidney disease, stage 3 (moderate): Secondary | ICD-10-CM | POA: Diagnosis not present

## 2016-01-10 DIAGNOSIS — I10 Essential (primary) hypertension: Secondary | ICD-10-CM | POA: Diagnosis not present

## 2016-01-10 DIAGNOSIS — Z954 Presence of other heart-valve replacement: Secondary | ICD-10-CM | POA: Diagnosis not present

## 2016-01-13 ENCOUNTER — Other Ambulatory Visit: Payer: Self-pay | Admitting: Internal Medicine

## 2016-01-17 ENCOUNTER — Encounter: Payer: Self-pay | Admitting: Family Medicine

## 2016-01-25 DIAGNOSIS — S82111A Displaced fracture of right tibial spine, initial encounter for closed fracture: Secondary | ICD-10-CM | POA: Diagnosis not present

## 2016-01-30 ENCOUNTER — Ambulatory Visit: Payer: Medicare Other | Admitting: Family Medicine

## 2016-01-31 DIAGNOSIS — H4089 Other specified glaucoma: Secondary | ICD-10-CM | POA: Diagnosis not present

## 2016-01-31 DIAGNOSIS — H25813 Combined forms of age-related cataract, bilateral: Secondary | ICD-10-CM | POA: Diagnosis not present

## 2016-02-01 ENCOUNTER — Ambulatory Visit: Payer: Medicare Other | Admitting: Internal Medicine

## 2016-02-04 ENCOUNTER — Other Ambulatory Visit: Payer: Self-pay | Admitting: Internal Medicine

## 2016-02-04 ENCOUNTER — Ambulatory Visit (INDEPENDENT_AMBULATORY_CARE_PROVIDER_SITE_OTHER): Payer: Medicare Other | Admitting: Pharmacist

## 2016-02-04 DIAGNOSIS — I482 Chronic atrial fibrillation, unspecified: Secondary | ICD-10-CM

## 2016-02-04 DIAGNOSIS — Z5181 Encounter for therapeutic drug level monitoring: Secondary | ICD-10-CM | POA: Diagnosis not present

## 2016-02-04 DIAGNOSIS — Z952 Presence of prosthetic heart valve: Secondary | ICD-10-CM

## 2016-02-04 LAB — POCT INR: INR: 2.5

## 2016-02-11 DIAGNOSIS — H2512 Age-related nuclear cataract, left eye: Secondary | ICD-10-CM | POA: Diagnosis not present

## 2016-02-11 DIAGNOSIS — H25812 Combined forms of age-related cataract, left eye: Secondary | ICD-10-CM | POA: Diagnosis not present

## 2016-02-11 DIAGNOSIS — H4089 Other specified glaucoma: Secondary | ICD-10-CM | POA: Diagnosis not present

## 2016-02-12 ENCOUNTER — Other Ambulatory Visit: Payer: Self-pay | Admitting: Internal Medicine

## 2016-02-20 ENCOUNTER — Ambulatory Visit: Payer: Medicare Other | Admitting: Family Medicine

## 2016-02-25 ENCOUNTER — Other Ambulatory Visit: Payer: Self-pay | Admitting: Internal Medicine

## 2016-02-25 DIAGNOSIS — S82111A Displaced fracture of right tibial spine, initial encounter for closed fracture: Secondary | ICD-10-CM | POA: Diagnosis not present

## 2016-02-27 ENCOUNTER — Other Ambulatory Visit: Payer: Self-pay | Admitting: Internal Medicine

## 2016-03-03 ENCOUNTER — Other Ambulatory Visit: Payer: Self-pay | Admitting: Family Medicine

## 2016-03-03 DIAGNOSIS — Z1231 Encounter for screening mammogram for malignant neoplasm of breast: Secondary | ICD-10-CM

## 2016-03-10 ENCOUNTER — Ambulatory Visit: Payer: Medicare Other | Admitting: Internal Medicine

## 2016-03-10 ENCOUNTER — Other Ambulatory Visit: Payer: Self-pay

## 2016-03-12 ENCOUNTER — Ambulatory Visit (INDEPENDENT_AMBULATORY_CARE_PROVIDER_SITE_OTHER): Payer: Medicare Other | Admitting: *Deleted

## 2016-03-12 DIAGNOSIS — Z5181 Encounter for therapeutic drug level monitoring: Secondary | ICD-10-CM

## 2016-03-12 DIAGNOSIS — Z952 Presence of prosthetic heart valve: Secondary | ICD-10-CM | POA: Diagnosis not present

## 2016-03-12 DIAGNOSIS — I482 Chronic atrial fibrillation, unspecified: Secondary | ICD-10-CM

## 2016-03-12 LAB — POCT INR: INR: 2.3

## 2016-03-19 ENCOUNTER — Other Ambulatory Visit: Payer: Self-pay | Admitting: Internal Medicine

## 2016-03-27 ENCOUNTER — Ambulatory Visit: Payer: Medicare Other

## 2016-04-01 ENCOUNTER — Other Ambulatory Visit: Payer: Self-pay | Admitting: Internal Medicine

## 2016-04-14 ENCOUNTER — Ambulatory Visit (INDEPENDENT_AMBULATORY_CARE_PROVIDER_SITE_OTHER): Payer: Medicare Other | Admitting: *Deleted

## 2016-04-14 DIAGNOSIS — Z5181 Encounter for therapeutic drug level monitoring: Secondary | ICD-10-CM | POA: Diagnosis not present

## 2016-04-14 DIAGNOSIS — I482 Chronic atrial fibrillation, unspecified: Secondary | ICD-10-CM

## 2016-04-14 DIAGNOSIS — Z952 Presence of prosthetic heart valve: Secondary | ICD-10-CM | POA: Diagnosis not present

## 2016-04-14 LAB — POCT INR: INR: 2.5

## 2016-04-21 ENCOUNTER — Ambulatory Visit: Payer: Medicare Other

## 2016-05-01 IMAGING — DX DG CHEST 2V
2 series · 2 of 2 positions shown · non-contrast
Comparison: 02/12/2006

CLINICAL DATA: Chest pain, shortness of breath, cough for 5 days

EXAM:
CHEST  2 VIEW

[chest lat]
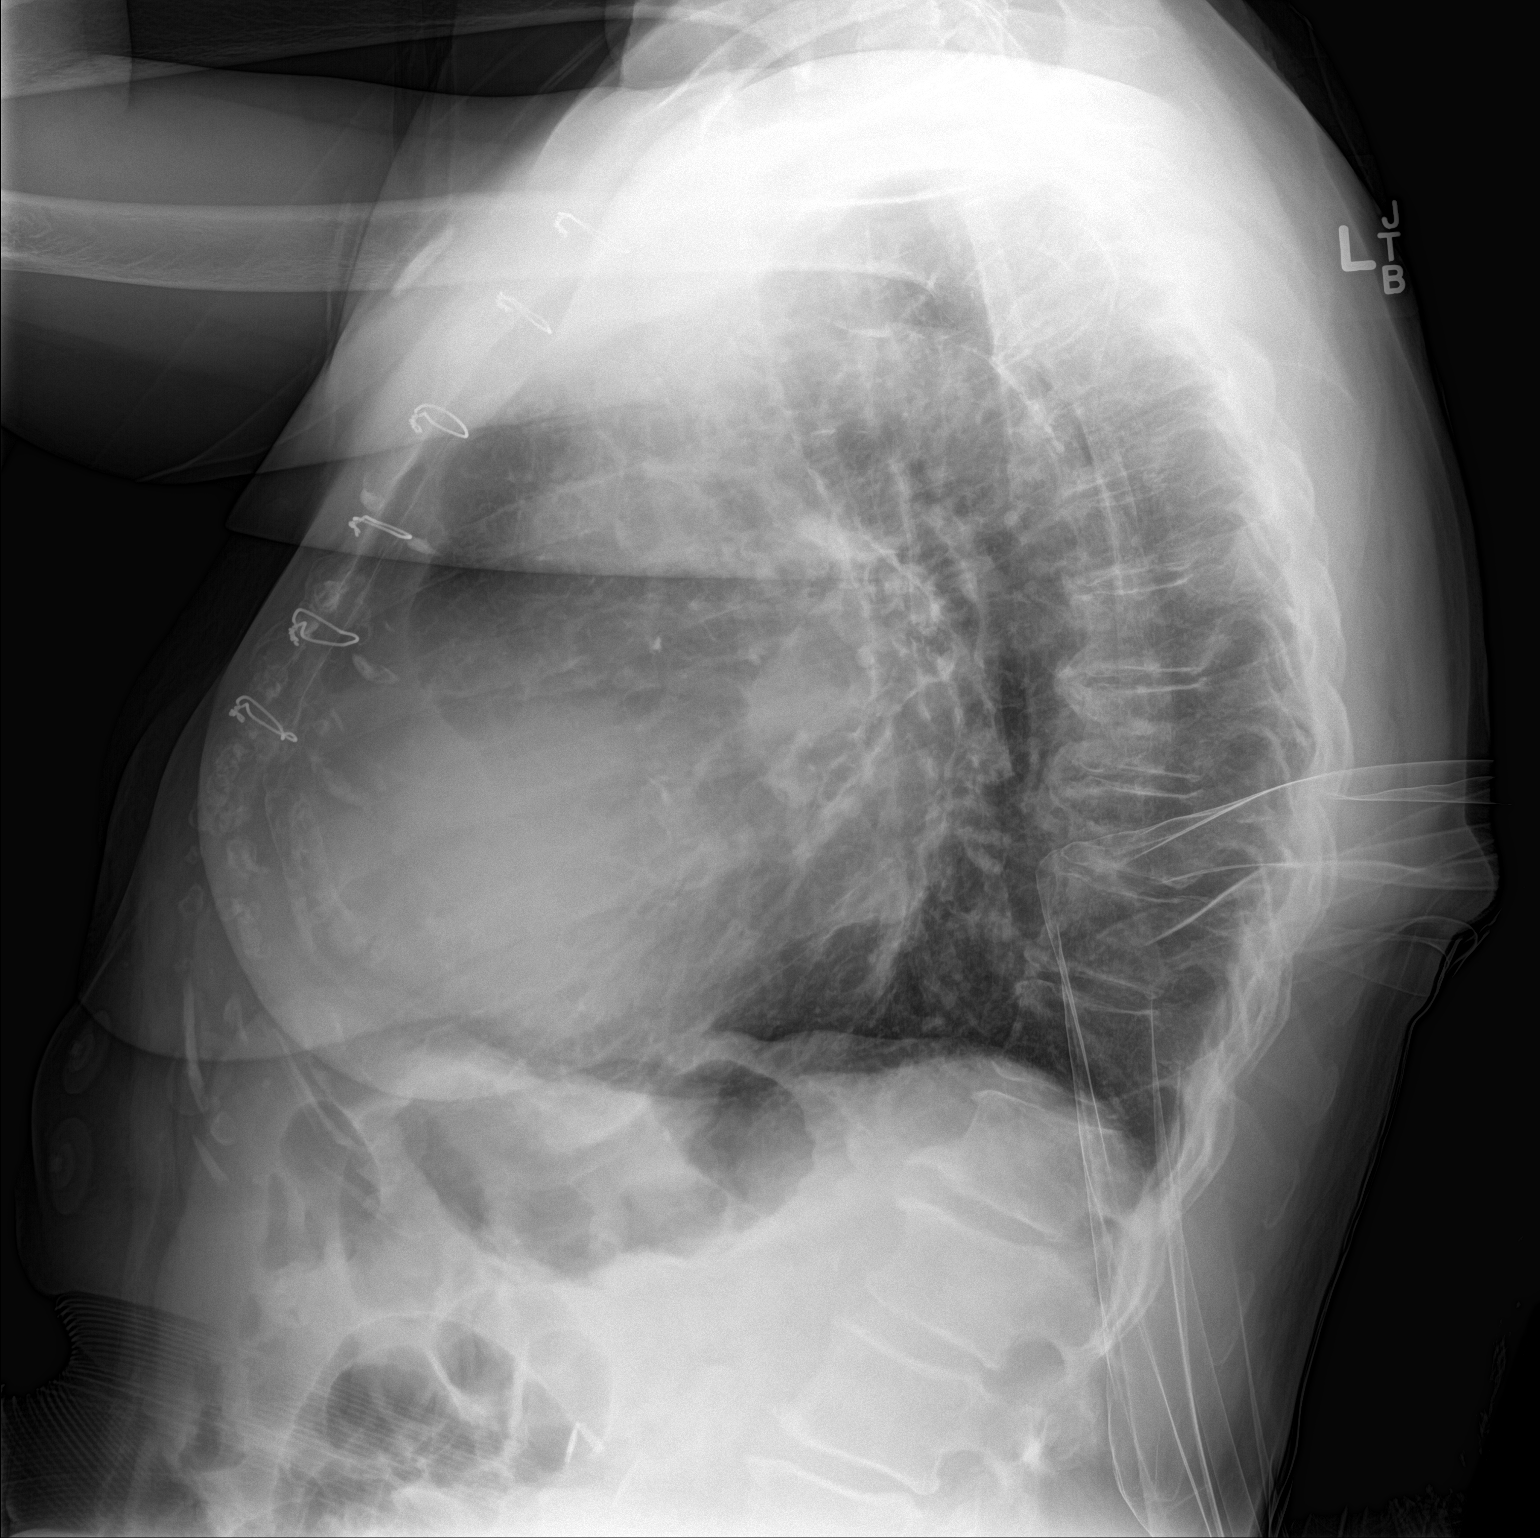

[chest ap]
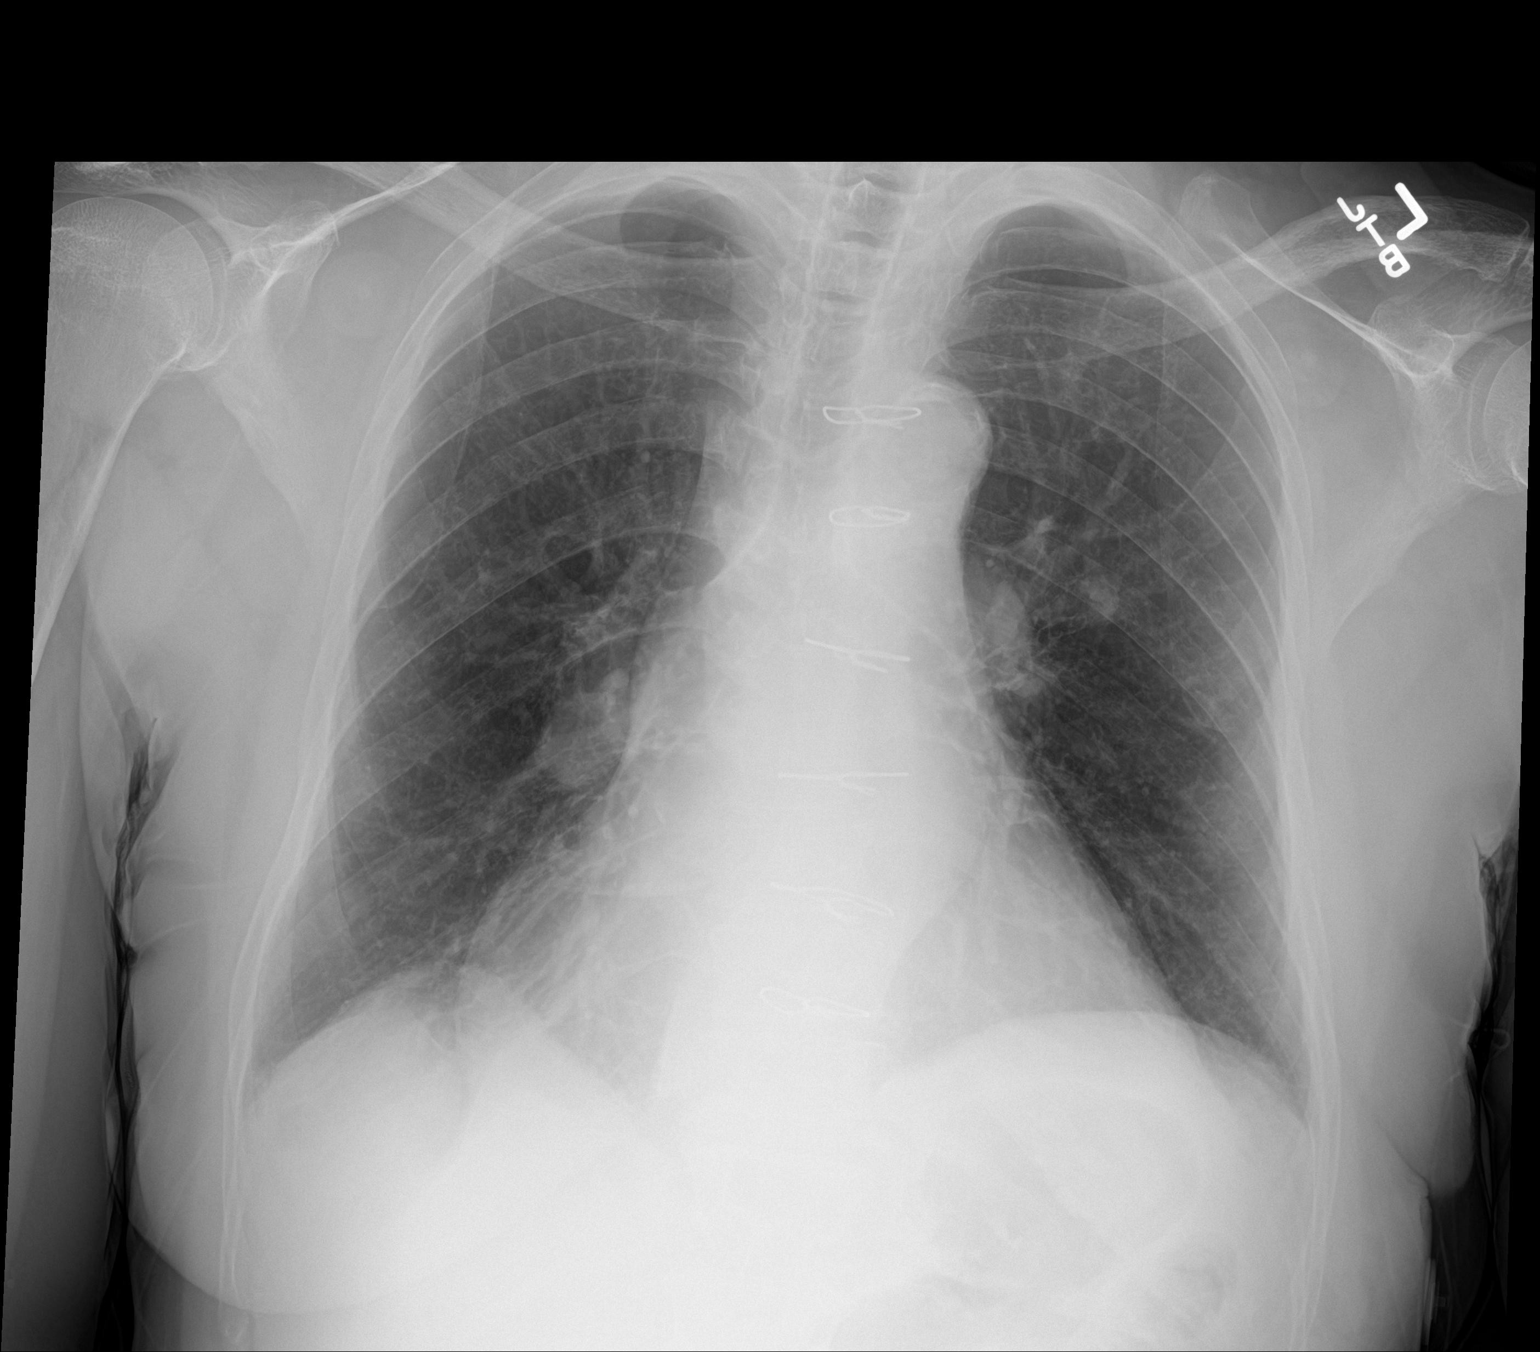

[2 of 2 positions shown; findings below may reference images not displayed]

FINDINGS: Cardiomediastinal silhouette is stable. The patient is status post
median sternotomy. Mild hyperinflation. No acute infiltrate or
pulmonary edema. Degenerative changes mid and lower thoracic spine.
IMPRESSION: No active disease. Mild hyperinflation. Degenerative changes
thoracic spine.

## 2016-05-06 ENCOUNTER — Ambulatory Visit (INDEPENDENT_AMBULATORY_CARE_PROVIDER_SITE_OTHER): Payer: Medicare Other | Admitting: *Deleted

## 2016-05-06 ENCOUNTER — Encounter: Payer: Self-pay | Admitting: *Deleted

## 2016-05-06 VITALS — BP 154/68 | HR 50 | Temp 97.9°F | Ht 67.5 in | Wt 170.6 lb

## 2016-05-06 DIAGNOSIS — Z23 Encounter for immunization: Secondary | ICD-10-CM

## 2016-05-06 DIAGNOSIS — Z0000 Encounter for general adult medical examination without abnormal findings: Secondary | ICD-10-CM

## 2016-05-06 NOTE — Patient Instructions (Signed)
Fall Prevention in the Home Falls can cause injuries. They can happen to people of all ages. There are many things you can do to make your home safe and to help prevent falls. What can I do on the outside of my home?  Regularly fix the edges of walkways and driveways and fix any cracks.  Remove anything that might make you trip as you walk through a door, such as a raised step or threshold.  Trim any bushes or trees on the path to your home.  Use bright outdoor lighting.  Clear any walking paths of anything that might make someone trip, such as rocks or tools.  Regularly check to see if handrails are loose or broken. Make sure that both sides of any steps have handrails.  Any raised decks and porches should have guardrails on the edges.  Have any leaves, snow, or ice cleared regularly.  Use sand or salt on walking paths during winter.  Clean up any spills in your garage right away. This includes oil or grease spills. What can I do in the bathroom?  Use night lights.  Install grab bars by the toilet and in the tub and shower. Do not use towel bars as grab bars.  Use non-skid mats or decals in the tub or shower.  If you need to sit down in the shower, use a plastic, non-slip stool.  Keep the floor dry. Clean up any water that spills on the floor as soon as it happens.  Remove soap buildup in the tub or shower regularly.  Attach bath mats securely with double-sided non-slip rug tape.  Do not have throw rugs and other things on the floor that can make you trip. What can I do in the bedroom?  Use night lights.  Make sure that you have a light by your bed that is easy to reach.  Do not use any sheets or blankets that are too big for your bed. They should not hang down onto the floor.  Have a firm chair that has side arms. You can use this for support while you get dressed.  Do not have throw rugs and other things on the floor that can make you trip. What can I do in  the kitchen?  Clean up any spills right away.  Avoid walking on wet floors.  Keep items that you use a lot in easy-to-reach places.  If you need to reach something above you, use a strong step stool that has a grab bar.  Keep electrical cords out of the way.  Do not use floor polish or wax that makes floors slippery. If you must use wax, use non-skid floor wax.  Do not have throw rugs and other things on the floor that can make you trip. What can I do with my stairs?  Do not leave any items on the stairs.  Make sure that there are handrails on both sides of the stairs and use them. Fix handrails that are broken or loose. Make sure that handrails are as long as the stairways.  Check any carpeting to make sure that it is firmly attached to the stairs. Fix any carpet that is loose or worn.  Avoid having throw rugs at the top or bottom of the stairs. If you do have throw rugs, attach them to the floor with carpet tape.  Make sure that you have a light switch at the top of the stairs and the bottom of the stairs. If you  do not have them, ask someone to add them for you. What else can I do to help prevent falls?  Wear shoes that:  Do not have high heels.  Have rubber bottoms.  Are comfortable and fit you well.  Are closed at the toe. Do not wear sandals.  If you use a stepladder:  Make sure that it is fully opened. Do not climb a closed stepladder.  Make sure that both sides of the stepladder are locked into place.  Ask someone to hold it for you, if possible.  Clearly mark and make sure that you can see:  Any grab bars or handrails.  First and last steps.  Where the edge of each step is.  Use tools that help you move around (mobility aids) if they are needed. These include:  Canes.  Walkers.  Scooters.  Crutches.  Turn on the lights when you go into a dark area. Replace any light bulbs as soon as they burn out.  Set up your furniture so you have a clear  path. Avoid moving your furniture around.  If any of your floors are uneven, fix them.  If there are any pets around you, be aware of where they are.  Review your medicines with your doctor. Some medicines can make you feel dizzy. This can increase your chance of falling. Ask your doctor what other things that you can do to help prevent falls. This information is not intended to replace advice given to you by your health care provider. Make sure you discuss any questions you have with your health care provider. Document Released: 11/02/2008 Document Revised: 06/14/2015 Document Reviewed: 02/10/2014 Elsevier Interactive Patient Education  2017 [place] Maintenance, Female Adopting a healthy lifestyle and getting preventive care can go a long way to promote health and wellness. Talk with your health care provider about what schedule of regular examinations is right for you. This is a good chance for you to check in with your provider about disease prevention and staying healthy. In between checkups, there are plenty of things you can do on your own. Experts have done a lot of research about which lifestyle changes and preventive measures are most likely to keep you healthy. Ask your health care provider for more information. Weight and diet Eat a healthy diet  Be sure to include plenty of vegetables, fruits, low-fat dairy products, and lean protein.  Do not eat a lot of foods high in solid fats, added sugars, or salt.  Get regular exercise. This is one of the most important things you can do for your health.  Most adults should exercise for at least 150 minutes each week. The exercise should increase your heart rate and make you sweat (moderate-intensity exercise).  Most adults should also do strengthening exercises at least twice a week. This is in addition to the moderate-intensity exercise. Maintain a healthy weight  Body mass index (BMI) is a measurement that can be used  to identify possible weight problems. It estimates body fat based on height and weight. Your health care provider can help determine your BMI and help you achieve or maintain a healthy weight.  For females 45 years of age and older:  A BMI below 18.5 is considered underweight.  A BMI of 18.5 to 24.9 is normal.  A BMI of 25 to 29.9 is considered overweight.  A BMI of 30 and above is considered obese. Watch levels of cholesterol and blood lipids  You should start having  your blood tested for lipids and cholesterol at 79 years of age, then have this test every 5 years.  You may need to have your cholesterol levels checked more often if:  Your lipid or cholesterol levels are high.  You are older than 79 years of age.  You are at high risk for heart disease. Cancer screening Lung Cancer  Lung cancer screening is recommended for adults 55-80 years old who are at high risk for lung cancer because of a history of smoking.  A yearly low-dose CT scan of the lungs is recommended for people who:  Currently smoke.  Have quit within the past 15 years.  Have at least a 30-pack-year history of smoking. A pack year is smoking an average of one pack of cigarettes a day for 1 year.  Yearly screening should continue until it has been 15 years since you quit.  Yearly screening should stop if you develop a health problem that would prevent you from having lung cancer treatment. Breast Cancer  Practice breast self-awareness. This means understanding how your breasts normally appear and feel.  It also means doing regular breast self-exams. Let your health care provider know about any changes, no matter how small.  If you are in your 20s or 30s, you should have a clinical breast exam (CBE) by a health care provider every 1-3 years as part of a regular health exam.  If you are 40 or older, have a CBE every year. Also consider having a breast X-ray (mammogram) every year.  If you have a family  history of breast cancer, talk to your health care provider about genetic screening.  If you are at high risk for breast cancer, talk to your health care provider about having an MRI and a mammogram every year.  Breast cancer gene (BRCA) assessment is recommended for women who have family members with BRCA-related cancers. BRCA-related cancers include:  Breast.  Ovarian.  Tubal.  Peritoneal cancers.  Results of the assessment will determine the need for genetic counseling and BRCA1 and BRCA2 testing. Cervical Cancer  Your health care provider may recommend that you be screened regularly for cancer of the pelvic organs (ovaries, uterus, and vagina). This screening involves a pelvic examination, including checking for microscopic changes to the surface of your cervix (Pap test). You may be encouraged to have this screening done every 3 years, beginning at age 21.  For women ages 30-65, health care providers may recommend pelvic exams and Pap testing every 3 years, or they may recommend the Pap and pelvic exam, combined with testing for human papilloma virus (HPV), every 5 years. Some types of HPV increase your risk of cervical cancer. Testing for HPV may also be done on women of any age with unclear Pap test results.  Other health care providers may not recommend any screening for nonpregnant women who are considered low risk for pelvic cancer and who do not have symptoms. Ask your health care provider if a screening pelvic exam is right for you.  If you have had past treatment for cervical cancer or a condition that could lead to cancer, you need Pap tests and screening for cancer for at least 20 years after your treatment. If Pap tests have been discontinued, your risk factors (such as having a new sexual partner) need to be reassessed to determine if screening should resume. Some women have medical problems that increase the chance of getting cervical cancer. In these cases, your health care  provider may   recommend more frequent screening and Pap tests. Colorectal Cancer  This type of cancer can be detected and often prevented.  Routine colorectal cancer screening usually begins at 79 years of age and continues through 79 years of age.  Your health care provider may recommend screening at an earlier age if you have risk factors for colon cancer.  Your health care provider may also recommend using home test kits to check for hidden blood in the stool.  A small camera at the end of a tube can be used to examine your colon directly (sigmoidoscopy or colonoscopy). This is done to check for the earliest forms of colorectal cancer.  Routine screening usually begins at age 50.  Direct examination of the colon should be repeated every 5-10 years through 79 years of age. However, you may need to be screened more often if early forms of precancerous polyps or small growths are found. Skin Cancer  Check your skin from head to toe regularly.  Tell your health care provider about any new moles or changes in moles, especially if there is a change in a mole's shape or color.  Also tell your health care provider if you have a mole that is larger than the size of a pencil eraser.  Always use sunscreen. Apply sunscreen liberally and repeatedly throughout the day.  Protect yourself by wearing long sleeves, pants, a wide-brimmed hat, and sunglasses whenever you are outside. Heart disease, diabetes, and high blood pressure  High blood pressure causes heart disease and increases the risk of stroke. High blood pressure is more likely to develop in:  People who have blood pressure in the high end of the normal range (130-139/85-89 mm Hg).  People who are overweight or obese.  People who are African American.  If you are 18-39 years of age, have your blood pressure checked every 3-5 years. If you are 40 years of age or older, have your blood pressure checked every year. You should have your  blood pressure measured twice-once when you are at a hospital or clinic, and once when you are not at a hospital or clinic. Record the average of the two measurements. To check your blood pressure when you are not at a hospital or clinic, you can use:  An automated blood pressure machine at a pharmacy.  A home blood pressure monitor.  If you are between 55 years and 79 years old, ask your health care provider if you should take aspirin to prevent strokes.  Have regular diabetes screenings. This involves taking a blood sample to check your fasting blood sugar level.  If you are at a normal weight and have a low risk for diabetes, have this test once every three years after 79 years of age.  If you are overweight and have a high risk for diabetes, consider being tested at a younger age or more often. Preventing infection Hepatitis B  If you have a higher risk for hepatitis B, you should be screened for this virus. You are considered at high risk for hepatitis B if:  You were born in a country where hepatitis B is common. Ask your health care provider which countries are considered high risk.  Your parents were born in a high-risk country, and you have not been immunized against hepatitis B (hepatitis B vaccine).  You have HIV or AIDS.  You use needles to inject street drugs.  You live with someone who has hepatitis B.  You have had sex with someone who   has hepatitis B.  You get hemodialysis treatment.  You take certain medicines for conditions, including cancer, organ transplantation, and autoimmune conditions. Hepatitis C  Blood testing is recommended for:  Everyone born from [birth year] through 1963.  Anyone with known risk factors for hepatitis C. Sexually transmitted infections (STIs)  You should be screened for sexually transmitted infections (STIs) including gonorrhea and chlamydia if:  You are sexually active and are younger than 79 years of age.  You are older than 79  years of age and your health care provider tells you that you are at risk for this type of infection.  Your sexual activity has changed since you were last screened and you are at an increased risk for chlamydia or gonorrhea. Ask your health care provider if you are at risk.  If you do not have HIV, but are at risk, it may be recommended that you take a prescription medicine daily to prevent HIV infection. This is called pre-exposure prophylaxis (PrEP). You are considered at risk if:  You are sexually active and do not regularly use condoms or know the HIV status of your partner(s).  You take drugs by injection.  You are sexually active with a partner who has HIV. Talk with your health care provider about whether you are at high risk of being infected with HIV. If you choose to begin PrEP, you should first be tested for HIV. You should then be tested every 3 months for as long as you are taking PrEP. Pregnancy  If you are premenopausal and you may become pregnant, ask your health care provider about preconception counseling.  If you may become pregnant, take 400 to 800 micrograms (mcg) of folic acid every day.  If you want to prevent pregnancy, talk to your health care provider about birth control (contraception). Osteoporosis and menopause  Osteoporosis is a disease in which the bones lose minerals and strength with aging. This can result in serious bone fractures. Your risk for osteoporosis can be identified using a bone density scan.  If you are 64 years of age or older, or if you are at risk for osteoporosis and fractures, ask your health care provider if you should be screened.  Ask your health care provider whether you should take a calcium or vitamin D supplement to lower your risk for osteoporosis.  Menopause may have certain physical symptoms and risks.  Hormone replacement therapy may reduce some of these symptoms and risks. Talk to your health care provider about whether  hormone replacement therapy is right for you. Follow these instructions at home:  Schedule regular health, dental, and eye exams.  Stay current with your immunizations.  Do not use any tobacco products including cigarettes, chewing tobacco, or electronic cigarettes.  If you are pregnant, do not drink alcohol.  If you are breastfeeding, limit how much and how often you drink alcohol.  Limit alcohol intake to no more than 1 drink per day for nonpregnant women. One drink equals 12 ounces of beer, 5 ounces of wine, or 1 ounces of hard liquor.  Do not use street drugs.  Do not share needles.  Ask your health care provider for help if you need support or information about quitting drugs.  Tell your health care provider if you often feel depressed.  Tell your health care provider if you have ever been abused or do not feel safe at home. This information is not intended to replace advice given to you by your health care provider. Make  sure you discuss any questions you have with your health care provider. Document Released: 07/22/2010 Document Revised: 06/14/2015 Document Reviewed: 10/10/2014 [employer] Interactive Patient Education  2017 [employer]  

## 2016-05-06 NOTE — Progress Notes (Signed)
Subjective:   [name] is a 81 y.o. female who presents with daughter for an Initial Medicare Annual Wellness Visit.  Cardiac Risk Factors include: advanced age (>59men, >22 women);dyslipidemia;hypertension;smoking/ tobacco exposure     Objective:    Today's Vitals   05/06/16 1000  BP: (!) 154/68  Pulse: (!) 50  Temp: 97.9 F (36.6 C)  SpO2: 99%  Weight: 170 lb 9.6 oz (77.4 kg)  Height: 5' 7.5" (1.715 m)   Body mass index is 26.33 kg/m.  Patient took all BP meds this AM at 0600 except HCTZ. Due to need for void will take when she returns home. BP re check 160/76 left arm manually with adult cuff. P 46 manually; denies dizziness/lightheadedness. Patient has appt with cardiology tomorrow.  Patient has home BP monitoring device. Patient instructed on most accurate way to take BP, to keep BP/HR log with date and time and how she is feeling and bring log to all future visits.   Current Medications (verified) Outpatient Encounter Prescriptions as of 05/06/2016  Medication Sig  . Acetaminophen 500 MG coapsule Take 650 mg by mouth every 6 (six) hours as needed for fever.  . Brinzolamide-Brimonidine 1-0.2 % SUSP Place 1 drop into the left eye 3 (three) times daily.   Marland Kitchen CARTIA XT 180 MG 24 hr capsule TAKE 1 CAPSULE BY MOUTH DAILY  . Cholecalciferol (VITAMIN D) 2000 UNITS tablet Take 2,000 Units by mouth daily.  Marland Kitchen DIGOX 125 MCG tablet TAKE 1 TABLET BY MOUTH EVERY DAY. PLEASE KEEP UPCOMING APPOINTMENT FOR FURTHER REFILLS  . hydrochlorothiazide (HYDRODIURIL) 25 MG tablet Take 1 tablet (25 mg total) by mouth daily.  . lipase/protease/amylase (CREON) 12000 units CPEP capsule Take 48,000 Units by mouth 3 (three) times daily before meals.  . metoprolol (LOPRESSOR) 50 MG tablet TAKE 1 1/2 TABLETS BY MOUTH TWICE DAILY  . mirabegron ER (MYRBETRIQ) 25 MG TB24 tablet Take 25 mg by mouth daily.  . pantoprazole (PROTONIX) 40 MG tablet Take 40 mg by mouth daily.   . TRAVATAN Z 0.004 % SOLN  ophthalmic solution Place 1 drop into both eyes at bedtime.   Marland Kitchen warfarin (COUMADIN) 10 MG tablet Take as directed by [facility]  . HYDROcodone-acetaminophen (NORCO/VICODIN) 5-325 MG tablet Take 1-2 tablets by mouth every 6 (six) hours as needed for severe pain. (Patient not taking: Reported on 05/06/2016)  . polyethylene glycol (MIRALAX / GLYCOLAX) packet Take 17 g by mouth 2 (two) times daily. (Patient not taking: Reported on 05/06/2016)   Facility-Administered Encounter Medications as of 05/06/2016  Medication  . mitoMYcin (MUTAMYCIN) Injection Use in OR only (0.4 mg/ml)    Allergies (verified) Esomeprazole magnesium   History: Past Medical History:  Diagnosis Date  . Asthma    years ago  . Chronic atrial fibrillation (HCC)   . Dilated cardiomyopathy (HCC)    history of this, now resolved  . Dysphagia    Hx  . Fracture of tibial plateau 05/21/2015  . GERD (gastroesophageal reflux disease)   . GI bleed   . Hemorrhoid 04/2014   bleeding  . History of blood transfusion   . Hypertension   . Microcytic anemia   . Nonsustained ventricular tachycardia (HCC)   . Osteoporosis   . Protein in urine   . RBBB (right bundle branch block)   . Rheumatic mitral valve disease    Past Surgical History:  Procedure Laterality Date  . CHOLECYSTECTOMY    . COLONOSCOPY    . COLONOSCOPY W/ POLYPECTOMY    .  MITOMYCIN C APPLICATION Right 05/17/2014   Procedure: MITOMYCIN C APPLICATION;  Surgeon: Chalmers Guest, MD;  Location: [facility] OR;  Service: Ophthalmology;  Laterality: Right;  . MITRAL VALVE REPLACEMENT  1987   St Jude mechanical valve  . TRABECULECTOMY Right 05/17/2014   Procedure: TRABECULECTOMY WITH MITOMYCIN RIGHT EYE;  Surgeon: Chalmers Guest, MD;  Location: [facility] OR;  Service: Ophthalmology;  Laterality: Right;  . TUBAL LIGATION    . US ECHOCARDIOGRAPHY  02/2008, 03/2006, 06/2004   Family History  Problem Relation Age of Onset  . Cancer Father   . Hypertension Mother   . Stroke Mother   .  Cancer Sister     in the Bladder  . Endometriosis Daughter   . Stroke Sister   . Blindness Sister   . Hypertension Sister   . Thyroid disease Daughter   . Hypertension Daughter   . Hypertension Daughter   . Hypertension Daughter   . Liver disease Daughter   . Colon cancer Neg Hx    Social History   Occupational History  . Retired    Social History Main Topics  . Smoking status: Former Smoker    Packs/day: 1.50    Years: 15.00  . Smokeless tobacco: Never Used     Comment: quiit 1987  . Alcohol use No  . Drug use: No  . Sexual activity: Not on file    Tobacco Counseling Counseling given: Yes Patient is former smoker with no plans to restart   Activities of Daily Living In your present state of health, do you have any difficulty performing the following activities: 05/06/2016 06/01/2015  Hearing? Y N  Vision? Y N  Difficulty concentrating or making decisions? N N  Walking or climbing stairs? N Y  Dressing or bathing? N Y  Doing errands, shopping? 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 N -  Using the Toilet? N -  In the past six months, have you accidently leaked urine? N -  Do you have problems with loss of bowel control? N -  Managing your Medications? N -  Managing your Finances? N -  Housekeeping or managing your Housekeeping? N -  Some recent data might be hidden   Home Safety:  My home has a working smoke alarm:  Yes X 2           My home throw rugs have been fastened down to the floor or removed:  Removed I have non-slip mats in the bathtub and shower:  Yes and grab bars         All my home's stairs have railings or bannisters: One level home with no outside steps.          My home's floors, stairs and hallways are free from clutter, wires and cords:  Yes     I wear seatbelts consistently:  Yes    Immunizations and Health Maintenance Immunization History  Administered Date(s) Administered  . Influenza,inj,Quad PF,36+ Mos 10/25/2013, 10/24/2015  .  Pneumococcal Polysaccharide-23 09/24/2002  . Td 07/20/2001   Prevnar administered today Will obtain TDaP at local pharmacy  Health Maintenance Due  Topic Date Due  . DEXA SCAN  09/11/2002  Patient had dexa scan as patient of Dr. Nehemiah Settle 1.5-2 years ago. Will send for records. Colonoscopy done 2014 at Dr. Hulen Shouts office. Will send for records. Patient has eye exam scheduled for 05/12/2016 for f/u cataract surgery  Patient Care Team: Nestor Ramp, MD as PCP - General (Family Medicine) Marinus Maw,  MD as Consulting Physician (Cardiology) Willis Modena, MD as Consulting Physician (Gastroenterology) Dr[facility] Edrick Oh at [facility] for eye surgery  Indicate any recent Medical Services you may have received from other than [facility] providers in the past year (date may be approximate).     Assessment:   This is a routine wellness examination for Lachrista.   Hearing/Vision screen  Hearing Screening   Method: Audiometry             Right ear:   Fail Fail Fail  Fail    Left ear:   Fail Fail Fail  Fail    Comments: Hearing aid right ear   Dietary issues and exercise activities discussed: Current Exercise Habits: Structured exercise class ([employer] with Silver sneakers), Type of exercise: strength training/weights;treadmill;walking (bike), Time (Minutes): 60, Frequency (Times/Week): 3, Weekly Exercise (Minutes/Week): 180, Intensity: Mild  Goals    . Blood Pressure < 140/90    . Exercise 2x per week (120 min per time) (pt-stated)      Depression Screen PHQ 2/9 Scores 05/06/2016 10/24/2015 07/18/2015 07/11/2015 06/27/2015 06/11/2015  PHQ - 2 Score 0 0 0 0 0 0  PHQ- 9 Score - 0 - - - -    Fall Risk Fall Risk  05/06/2016 07/11/2015 06/27/2015  Falls in the past year? Yes No Yes  Number falls in past yr: 2 or more - 1  Injury with Fall? Yes - Yes  Risk Factor Category  High Fall Risk - -  Risk for fall due to : History of fall(s) - -  Risk for  fall due to (comments): Tripped over lamp cord/fell in shower - -  Follow up Falls evaluation completed;Education provided;Falls prevention discussed - -   TUG Test:  Done in 19 seconds. Patient used both hands to push out of chair and to sit back down.  Cognitive Function: Mini-Cog  Passed with score 4/5   Screening Tests Health Maintenance  Topic Date Due  . DEXA SCAN  09/11/2002  . TETANUS/TDAP  06/19/2016 (Originally 07/21/2011)  . PNA vac Low Risk Adult (2 of 2 - PCV13) 06/19/2016 (Originally 09/24/2003)  . INFLUENZA VACCINE  08/20/2016      Plan:    Patient had dexa scan as patient of Dr. Nehemiah Settle 1.5-2 years ago. Will send for records. Colonoscopy done 2014 at Dr. Hulen Shouts office. Will send for records. Patient has eye exam scheduled for 05/12/2016 for f/u cataract surgery Cardiology appt tomorrow Encouraged to schedule appt with PCP  During the course of the visit, Velta was educated and counseled about the following appropriate screening and preventive services:   Vaccines to include Pneumoccal, Influenza, Td, Zostavax/Shingrix  Cardiovascular disease screening  Colorectal cancer screening  Bone density screening  Diabetes screening  Mammography/PAP  Nutrition counseling  Patient Instructions (the written plan) were given to the patient.    Fredderick Severance, RN   05/06/2016

## 2016-05-07 ENCOUNTER — Ambulatory Visit (INDEPENDENT_AMBULATORY_CARE_PROVIDER_SITE_OTHER): Payer: Medicare Other | Admitting: Internal Medicine

## 2016-05-07 ENCOUNTER — Encounter: Payer: Self-pay | Admitting: Internal Medicine

## 2016-05-07 ENCOUNTER — Ambulatory Visit (INDEPENDENT_AMBULATORY_CARE_PROVIDER_SITE_OTHER): Payer: Medicare Other | Admitting: Pharmacist

## 2016-05-07 VITALS — BP 142/90 | HR 96 | Ht 68.0 in | Wt 167.8 lb

## 2016-05-07 DIAGNOSIS — I10 Essential (primary) hypertension: Secondary | ICD-10-CM

## 2016-05-07 DIAGNOSIS — I482 Chronic atrial fibrillation, unspecified: Secondary | ICD-10-CM

## 2016-05-07 DIAGNOSIS — Z7901 Long term (current) use of anticoagulants: Secondary | ICD-10-CM

## 2016-05-07 DIAGNOSIS — Z5181 Encounter for therapeutic drug level monitoring: Secondary | ICD-10-CM

## 2016-05-07 DIAGNOSIS — Z952 Presence of prosthetic heart valve: Secondary | ICD-10-CM | POA: Diagnosis not present

## 2016-05-07 LAB — POCT INR: INR: 2.7

## 2016-05-07 MED ORDER — DIGOXIN 125 MCG PO TABS: 0.1250 mg | ORAL_TABLET | ORAL | 3 refills | Status: DC

## 2016-05-07 NOTE — Progress Notes (Signed)
HPI  [name] returns today for followup. She is a very pleasant 81 year old woman with a history of mitral valve disease status post mitral valve replacement, chronic atrial fibrillation, hypertension, nonsustained ventricular tachycardia, also with a history of epistaxis. In the interim, she has done well. She is walking regularly, and exercising. She admits to sodium indiscretion however. No syncope. No peripheral edema. She has not had any residual nosebleeds.  Her blood pressure is a bit better.\ Her HR is improved.    Allergies  Allergen Reactions  . Esomeprazole Magnesium     REACTION: stomach upset, nausea     Current Outpatient Prescriptions  Medication Sig Dispense Refill  . Acetaminophen 500 MG coapsule Take 650 mg by mouth every 6 (six) hours as needed for fever.    . Brinzolamide-Brimonidine 1-0.2 % SUSP Place 1 drop into the left eye 3 (three) times daily.     Marland Kitchen CARTIA XT 180 MG 24 hr capsule TAKE 1 CAPSULE BY MOUTH DAILY 90 capsule 0  . Cholecalciferol (VITAMIN D) 2000 UNITS tablet Take 2,000 Units by mouth daily.    Marland Kitchen DIGOX 125 MCG tablet TAKE 1 TABLET BY MOUTH EVERY DAY. PLEASE KEEP UPCOMING APPOINTMENT FOR FURTHER REFILLS 45 tablet 0  . hydrochlorothiazide (HYDRODIURIL) 25 MG tablet Take 1 tablet (25 mg total) by mouth daily. 90 tablet 3  . HYDROcodone-acetaminophen (NORCO/VICODIN) 5-325 MG tablet Take 1-2 tablets by mouth every 6 (six) hours as needed for severe pain. (Patient not taking: Reported on 05/06/2016) 10 tablet 0  . lipase/protease/amylase (CREON) 12000 units CPEP capsule Take 48,000 Units by mouth 3 (three) times daily before meals.    . metoprolol (LOPRESSOR) 50 MG tablet TAKE 1 1/2 TABLETS BY MOUTH TWICE DAILY 90 tablet 1  . mirabegron ER (MYRBETRIQ) 25 MG TB24 tablet Take 25 mg by mouth daily.    . pantoprazole (PROTONIX) 40 MG tablet Take 40 mg by mouth daily.     . polyethylene glycol (MIRALAX / GLYCOLAX) packet Take 17 g by mouth 2 (two) times daily.  (Patient not taking: Reported on 05/06/2016) 14 each 0  . timolol (BETIMOL) 0.25 % ophthalmic solution Place 1-2 drops into both eyes 2 (two) times daily.    . TRAVATAN Z 0.004 % SOLN ophthalmic solution Place 1 drop into both eyes at bedtime.   12  . warfarin (COUMADIN) 10 MG tablet Take as directed by [facility] 30 tablet 2   No current facility-administered medications for this visit.    Facility-Administered Medications Ordered in Other Visits  Medication Dose Route Frequency Provider Last Rate Last Dose  . mitoMYcin (MUTAMYCIN) Injection Use in OR only (0.4 mg/ml)  0.5 mL Right Eye Once Chalmers Guest, MD         Past Medical History:  Diagnosis Date  . Asthma    years ago  . Chronic atrial fibrillation (HCC)   . Dilated cardiomyopathy (HCC)    history of this, now resolved  . Dysphagia    Hx  . Fracture of tibial plateau 05/21/2015  . GERD (gastroesophageal reflux disease)   . GI bleed   . Hemorrhoid 04/2014   bleeding  . History of blood transfusion   . Hypertension   . Microcytic anemia   . Nonsustained ventricular tachycardia (HCC)   . Osteoporosis   . Protein in urine   . RBBB (right bundle branch block)   . Rheumatic mitral valve disease     ROS:   All systems reviewed and negative except as  noted in the HPI.   Past Surgical History:  Procedure Laterality Date  . CHOLECYSTECTOMY    . COLONOSCOPY    . COLONOSCOPY W/ POLYPECTOMY    . MITOMYCIN C APPLICATION Right 05/17/2014   Procedure: MITOMYCIN C APPLICATION;  Surgeon: Chalmers Guest, MD;  Location: [facility] OR;  Service: Ophthalmology;  Laterality: Right;  . MITRAL VALVE REPLACEMENT  1987   St Jude mechanical valve  . TRABECULECTOMY Right 05/17/2014   Procedure: TRABECULECTOMY WITH MITOMYCIN RIGHT EYE;  Surgeon: Chalmers Guest, MD;  Location: [facility] OR;  Service: Ophthalmology;  Laterality: Right;  . TUBAL LIGATION    . US ECHOCARDIOGRAPHY  02/2008, 03/2006, 06/2004     Family History  Problem Relation Age of  Onset  . Cancer Father   . Hypertension Mother   . Stroke Mother   . Cancer Sister     in the Bladder  . Endometriosis Daughter   . Stroke Sister   . Blindness Sister   . Hypertension Sister   . Thyroid disease Daughter   . Hypertension Daughter   . Hypertension Daughter   . Hypertension Daughter   . Liver disease Daughter   . Colon cancer Neg Hx      Social History   Social History  . Marital status: Divorced    Spouse name: N/A  . Number of children: N/A  . Years of education: N/A   Occupational History  . Retired    Social History Main Topics  . Smoking status: Former Smoker    Packs/day: 1.50    Years: 15.00  . Smokeless tobacco: Never Used     Comment: quiit 1987  . Alcohol use No  . Drug use: No  . Sexual activity: Not on file   Other Topics Concern  . Not on file   Social History Narrative   Was living alone until she fell and broke her leg.   duaghter is [employer]     BP (!) 142/90   Pulse 96   Ht  (1.727 m)   Wt 167 lb 12.8 oz (76.1 kg)   SpO2 97%   BMI 25.51 kg/m   Physical Exam:  Well appearing 81 year old woman, NAD HEENT: Unremarkable Neck:  6 cm JVD, no thyromegally Lungs:  Clear with no wheezes, rales, or rhonchi. HEART:  IRegular rate rhythm, 2/6 systolic murmur, no rubs, no clicks. Mechanical S1 closure. Abd:  soft, positive bowel sounds, no organomegally, no rebound, no guarding Ext:  2 plus pulses, trace edema, no cyanosis, no clubbing Skin:  No rashes no nodules Neuro:  CN II through XII intact, motor grossly intact  EKG Atrial fibrillation with a controlled ventricular response and right bundle branch block.  Assess/Plan:  1. Atrial fibrillation - she has a well controlled VR. I have asked her to reduce her dose of digoxin to a table every other day. 2. Mechanical Mitral valve - her valve is working normally. Will recheck in several months. 3. Coags - she is tolerating her systemic anti-coagulation with warfarin and  had no bleeding problems. 4. HTN heart disease - her blood pressure is better. Will back off on her digoxin.  Leonia Reeves.D.

## 2016-05-07 NOTE — Patient Instructions (Addendum)
Medication Instructions:  DECREASE Digoxin to every other day.   Labwork: None ordered  Testing/Procedures: None ordered  Follow-Up: Your physician wants you to follow-up in: 1 year with Dr. [name]. You will receive a reminder letter in the mail two months in advance. If you don't receive a letter, please call our office to schedule the follow-up appointment.   Any Other Special Instructions Will Be Listed Below (If Applicable).     If you need a refill on your cardiac medications before your next appointment, please call your pharmacy.

## 2016-05-08 ENCOUNTER — Other Ambulatory Visit: Payer: Self-pay | Admitting: Internal Medicine

## 2016-05-14 NOTE — Progress Notes (Signed)
I have reviewed this visit and discussed with Alecia Lemming, RN, BSN, and agree with her documentation.  Hopefully she WILL set up an appointment to see me for ongoing continuity care soon.

## 2016-05-16 ENCOUNTER — Other Ambulatory Visit: Payer: Self-pay | Admitting: Internal Medicine

## 2016-05-19 DIAGNOSIS — H401133 Primary open-angle glaucoma, bilateral, severe stage: Secondary | ICD-10-CM | POA: Diagnosis not present

## 2016-06-13 ENCOUNTER — Other Ambulatory Visit: Payer: Self-pay | Admitting: Internal Medicine

## 2016-06-13 ENCOUNTER — Ambulatory Visit (INDEPENDENT_AMBULATORY_CARE_PROVIDER_SITE_OTHER): Payer: Medicare Other | Admitting: Pharmacist

## 2016-06-13 DIAGNOSIS — Z5181 Encounter for therapeutic drug level monitoring: Secondary | ICD-10-CM

## 2016-06-13 DIAGNOSIS — I482 Chronic atrial fibrillation, unspecified: Secondary | ICD-10-CM

## 2016-06-13 DIAGNOSIS — Z952 Presence of prosthetic heart valve: Secondary | ICD-10-CM | POA: Diagnosis not present

## 2016-06-13 LAB — POCT INR: INR: 2.1

## 2016-06-23 ENCOUNTER — Ambulatory Visit: Payer: Medicare Other

## 2016-06-23 DIAGNOSIS — H10413 Chronic giant papillary conjunctivitis, bilateral: Secondary | ICD-10-CM | POA: Diagnosis not present

## 2016-06-25 ENCOUNTER — Encounter: Payer: Self-pay | Admitting: Family Medicine

## 2016-06-25 ENCOUNTER — Ambulatory Visit (INDEPENDENT_AMBULATORY_CARE_PROVIDER_SITE_OTHER): Payer: Medicare Other | Admitting: Family Medicine

## 2016-06-25 VITALS — BP 160/96 | HR 77 | Temp 97.8°F | Ht 68.0 in | Wt 168.0 lb

## 2016-06-25 DIAGNOSIS — I482 Chronic atrial fibrillation, unspecified: Secondary | ICD-10-CM

## 2016-06-25 DIAGNOSIS — R5383 Other fatigue: Secondary | ICD-10-CM

## 2016-06-25 DIAGNOSIS — I472 Ventricular tachycardia, unspecified: Secondary | ICD-10-CM

## 2016-06-25 DIAGNOSIS — S82142E Displaced bicondylar fracture of left tibia, subsequent encounter for open fracture type I or II with routine healing: Secondary | ICD-10-CM

## 2016-06-25 DIAGNOSIS — I10 Essential (primary) hypertension: Secondary | ICD-10-CM | POA: Diagnosis not present

## 2016-06-25 DIAGNOSIS — N183 Chronic kidney disease, stage 3 unspecified: Secondary | ICD-10-CM

## 2016-06-25 DIAGNOSIS — I4729 Other ventricular tachycardia: Secondary | ICD-10-CM

## 2016-06-25 MED ORDER — LORATADINE 10 MG PO TABS
10.0000 mg | ORAL_TABLET | Freq: Every day | ORAL | 11 refills | Status: DC
Start: 2016-06-25 — End: 2017-05-18

## 2016-06-25 MED ORDER — DILTIAZEM HCL ER COATED BEADS 240 MG PO CP24: 240.0000 mg | ORAL_CAPSULE | Freq: Every day | ORAL | 3 refills | Status: DC

## 2016-06-25 NOTE — Assessment & Plan Note (Signed)
I reviewed her cardiology notes. Does not seem she's having any problems with rate control.

## 2016-06-25 NOTE — Assessment & Plan Note (Signed)
not having any problems at all from her prior leg fracture.

## 2016-06-25 NOTE — Progress Notes (Signed)
    CHIEF COMPLAINT / HPI:   She here after long absence for general checkup. Issues: #1. Hypertension: Her cardiologist decreased her digoxin dose recently. She's having occasional headaches that are frontal, usually lasts less than a day. She thinks it may be related to allergies or maybe related to her elevated blood pressure. #2. She's having some decreased vision and is being followed by ophthalmology. She had some type of surgery last year and it didn't seem to have improved her vision at all. She's also had injection of the conjunctiva with some itchy eyes. Her ophthalmologist gave her some eyedrops and told her to get some over-the-counter allergy medicine. #3. [facility] to [facility]. She realizes that her ophthalmologist we'll have to fill out the eye portion of that. #4. She is on long-term anticoagulation is being followed by cardiology clinic for that.  REVIEW OF SYSTEMS: Denies unusual weight change. She had been feeling a little more washed out but has had some improvement in this since changing her digitoxin dose. Denies dizziness. Has occasional headache. No diarrhea or constipation. No heartburn. Mood is stable. No chest pain, no lower extremity edema. Mild rhinitis. No cough.  OBJECTIVE:     Vital signs reviewed. GENERAL: Well-developed, well-nourished, no acute distress. CARDIOVASCULAR: Irreg irreg, mechaniocal valve click. LUNGS: Clear to auscultation bilaterally, no rales or wheeze. ABDOMEN: Soft positive bowel sounds NEURO: No gross focal neurological deficits. MSK: Movement of extremity x 4. HEENT: Conjunctiva injected.   ASSESSMENT / PLAN: #1. Feelings of fatigue and being washed out. This was mildly improved by recent decrease in her digoxin level. Since I have not seen her in a long time and I'll have any recent lab work on her I will check TSH and CBC, CMP. Hopefully get her back into some regular follow-up.   Please see  problem oriented charting for details of additional problems

## 2016-06-25 NOTE — Assessment & Plan Note (Signed)
I don't see where they've checked any lab work at all for a while so I will get a creatinine. She is followed by [facility] but is not clear to me when she was last seen there.

## 2016-06-25 NOTE — Assessment & Plan Note (Signed)
Pressure was much better controlled when she was seen at cardiologist's office. In the interim they have decreased her digoxin dose by half. I will increase her Cartia from 180-240 and see her back in one month. I will get lab work today.

## 2016-06-25 NOTE — Patient Instructions (Signed)
I am increasing your Cartia to a new dose--240 mg a day. I have sent the prescription in./ STOP the 180 mg dose and start the 240 mg dose.  I have also sent in a Rx for claritin--I hope your insurance will cover that for your allergies.  I will send you a note about your blood work.  You have a lot of chroni issues and I want to stay on top of them so I would like to see you back in about 4 weeks and probably plan to see you 3-4 times a year at least.

## 2016-06-26 ENCOUNTER — Encounter: Payer: Self-pay | Admitting: Family Medicine

## 2016-06-26 LAB — COMPREHENSIVE METABOLIC PANEL
ALT: 14 IU/L (ref 0–32)
AST: 20 IU/L (ref 0–40)
Albumin/Globulin Ratio: 1.1 — ABNORMAL LOW (ref 1.2–2.2)
Albumin: 4.5 g/dL (ref 3.5–4.8)
Alkaline Phosphatase: 52 IU/L (ref 39–117)
BUN/Creatinine Ratio: 14 (ref 12–28)
BUN: 20 mg/dL (ref 8–27)
Bilirubin Total: 0.8 mg/dL (ref 0.0–1.2)
CO2: 24 mmol/L (ref 18–29)
Calcium: 10.1 mg/dL (ref 8.7–10.3)
Chloride: 102 mmol/L (ref 96–106)
Creatinine, Ser: 1.38 mg/dL — ABNORMAL HIGH (ref 0.57–1.00)
GFR calc Af Amer: 42 mL/min/{1.73_m2} — ABNORMAL LOW (ref >59)
GFR calc non Af Amer: 37 mL/min/{1.73_m2} — ABNORMAL LOW (ref >59)
Globulin, Total: 4.1 g/dL (ref 1.5–4.5)
Glucose: 89 mg/dL (ref 65–99)
Potassium: 3.8 mmol/L (ref 3.5–5.2)
Sodium: 143 mmol/L (ref 134–144)
Total Protein: 8.6 g/dL — ABNORMAL HIGH (ref 6.0–8.5)

## 2016-06-26 LAB — CBC
Hematocrit: 41.5 % (ref 34.0–46.6)
Hemoglobin: 13.2 g/dL (ref 11.1–15.9)
MCH: 27 pg (ref 26.6–33.0)
MCHC: 31.8 g/dL (ref 31.5–35.7)
MCV: 85 fL (ref 79–97)
Platelets: 210 10*3/uL (ref 150–379)
RBC: 4.88 x10E6/uL (ref 3.77–5.28)
RDW: 14.9 % (ref 12.3–15.4)
WBC: 6.9 10*3/uL (ref 3.4–10.8)

## 2016-06-26 LAB — TSH: TSH: 2.11 u[IU]/mL (ref 0.450–4.500)

## 2016-06-26 LAB — LDL CHOLESTEROL, DIRECT: LDL Direct: 119 mg/dL — ABNORMAL HIGH (ref 0–99)

## 2016-06-30 ENCOUNTER — Ambulatory Visit
Admission: RE | Admit: 2016-06-30 | Discharge: 2016-06-30 | Disposition: A | Discharge status: Home / Self Care | Payer: Medicare Other | Source: Ambulatory Visit | Attending: Family Medicine | Admitting: Family Medicine

## 2016-06-30 DIAGNOSIS — Z1231 Encounter for screening mammogram for malignant neoplasm of breast: Secondary | ICD-10-CM | POA: Diagnosis not present

## 2016-07-04 ENCOUNTER — Other Ambulatory Visit: Payer: Self-pay | Admitting: Internal Medicine

## 2016-07-04 ENCOUNTER — Ambulatory Visit (INDEPENDENT_AMBULATORY_CARE_PROVIDER_SITE_OTHER): Payer: Medicare Other | Admitting: Pharmacist

## 2016-07-04 DIAGNOSIS — I482 Chronic atrial fibrillation, unspecified: Secondary | ICD-10-CM

## 2016-07-04 DIAGNOSIS — Z952 Presence of prosthetic heart valve: Secondary | ICD-10-CM | POA: Diagnosis not present

## 2016-07-04 DIAGNOSIS — Z5181 Encounter for therapeutic drug level monitoring: Secondary | ICD-10-CM | POA: Diagnosis not present

## 2016-07-04 LAB — POCT INR: INR: 2.5

## 2016-07-07 DIAGNOSIS — H401123 Primary open-angle glaucoma, left eye, severe stage: Secondary | ICD-10-CM | POA: Diagnosis not present

## 2016-07-22 DIAGNOSIS — H2000 Unspecified acute and subacute iridocyclitis: Secondary | ICD-10-CM | POA: Diagnosis not present

## 2016-07-22 DIAGNOSIS — H1033 Unspecified acute conjunctivitis, bilateral: Secondary | ICD-10-CM | POA: Diagnosis not present

## 2016-07-29 DIAGNOSIS — H2000 Unspecified acute and subacute iridocyclitis: Secondary | ICD-10-CM | POA: Diagnosis not present

## 2016-07-30 ENCOUNTER — Ambulatory Visit: Payer: Medicare Other | Admitting: Family Medicine

## 2016-08-01 DIAGNOSIS — H2000 Unspecified acute and subacute iridocyclitis: Secondary | ICD-10-CM | POA: Diagnosis not present

## 2016-08-04 ENCOUNTER — Ambulatory Visit (INDEPENDENT_AMBULATORY_CARE_PROVIDER_SITE_OTHER): Payer: Medicare Other | Admitting: *Deleted

## 2016-08-04 DIAGNOSIS — Z952 Presence of prosthetic heart valve: Secondary | ICD-10-CM | POA: Diagnosis not present

## 2016-08-04 DIAGNOSIS — I482 Chronic atrial fibrillation, unspecified: Secondary | ICD-10-CM

## 2016-08-04 DIAGNOSIS — Z5181 Encounter for therapeutic drug level monitoring: Secondary | ICD-10-CM | POA: Diagnosis not present

## 2016-08-04 LAB — POCT INR: INR: 1.9

## 2016-08-08 DIAGNOSIS — H10413 Chronic giant papillary conjunctivitis, bilateral: Secondary | ICD-10-CM | POA: Diagnosis not present

## 2016-08-15 DIAGNOSIS — H10413 Chronic giant papillary conjunctivitis, bilateral: Secondary | ICD-10-CM | POA: Diagnosis not present

## 2016-08-21 ENCOUNTER — Ambulatory Visit (INDEPENDENT_AMBULATORY_CARE_PROVIDER_SITE_OTHER): Payer: Medicare Other | Admitting: *Deleted

## 2016-08-21 DIAGNOSIS — I482 Chronic atrial fibrillation, unspecified: Secondary | ICD-10-CM

## 2016-08-21 DIAGNOSIS — Z952 Presence of prosthetic heart valve: Secondary | ICD-10-CM

## 2016-08-21 DIAGNOSIS — Z5181 Encounter for therapeutic drug level monitoring: Secondary | ICD-10-CM

## 2016-08-21 LAB — POCT INR: INR: 2.8

## 2016-08-26 DIAGNOSIS — H401133 Primary open-angle glaucoma, bilateral, severe stage: Secondary | ICD-10-CM | POA: Diagnosis not present

## 2016-08-26 IMAGING — CR DG WRIST COMPLETE 3+V*L*
4 series · 4 of 4 positions shown · non-contrast
Comparison: No prior.

CLINICAL DATA: Fall.  Swelling.

EXAM:
LEFT WRIST - COMPLETE 3+ VIEW

[wrist pa]
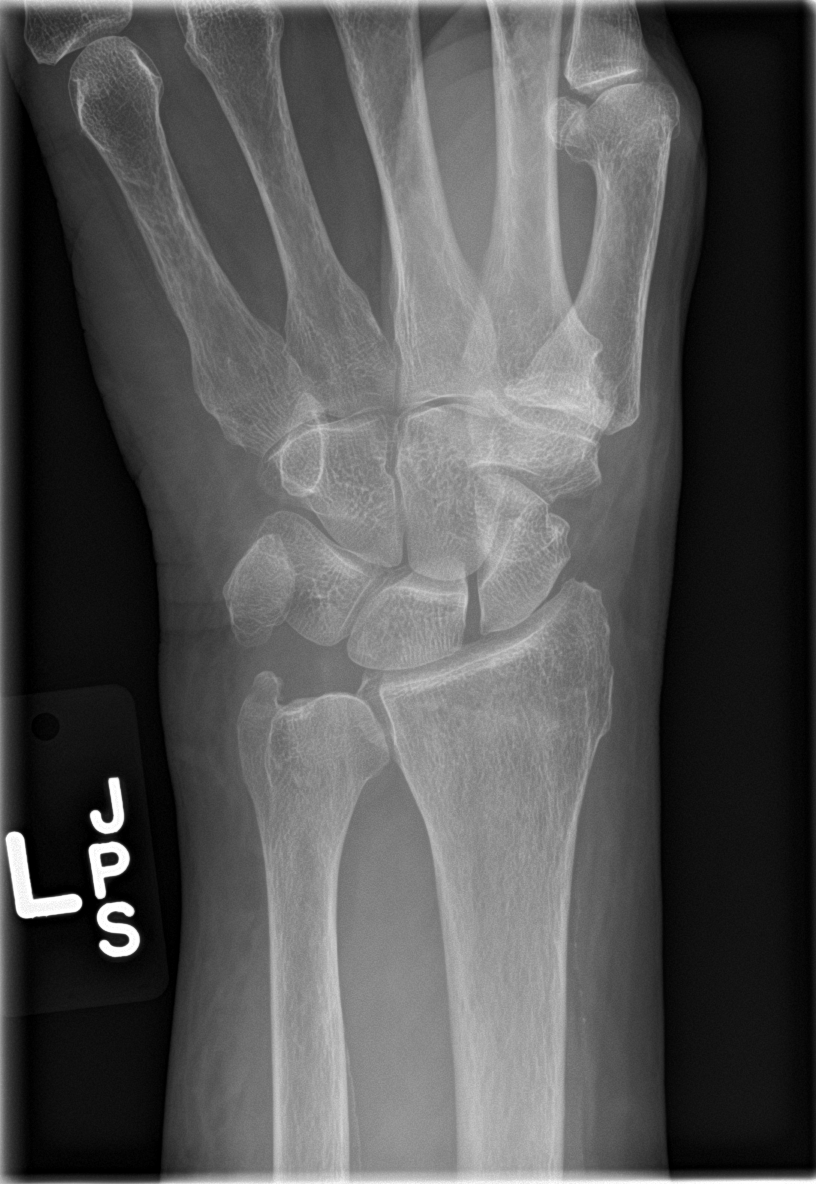

[wrist obl]
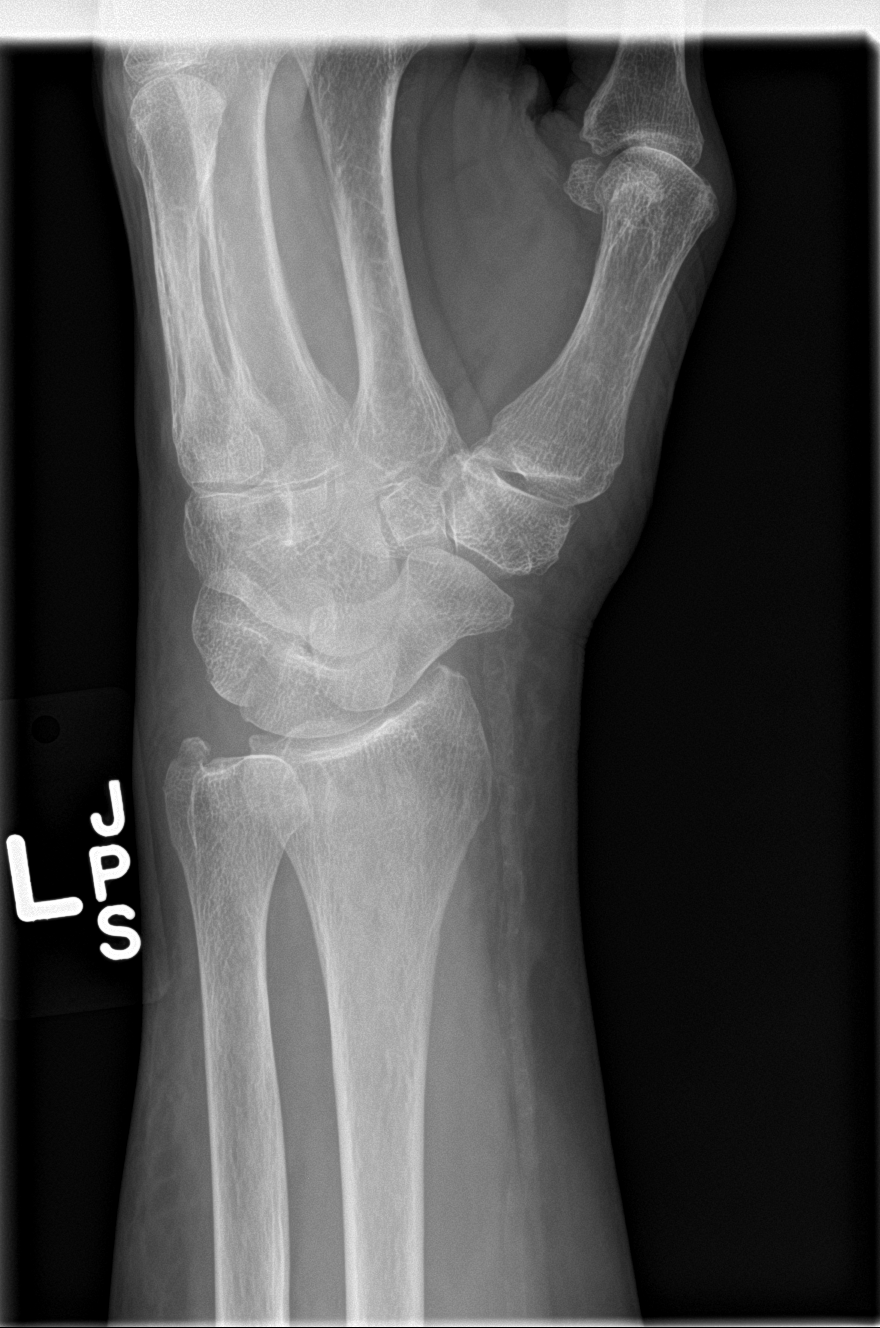

[wrist lat]
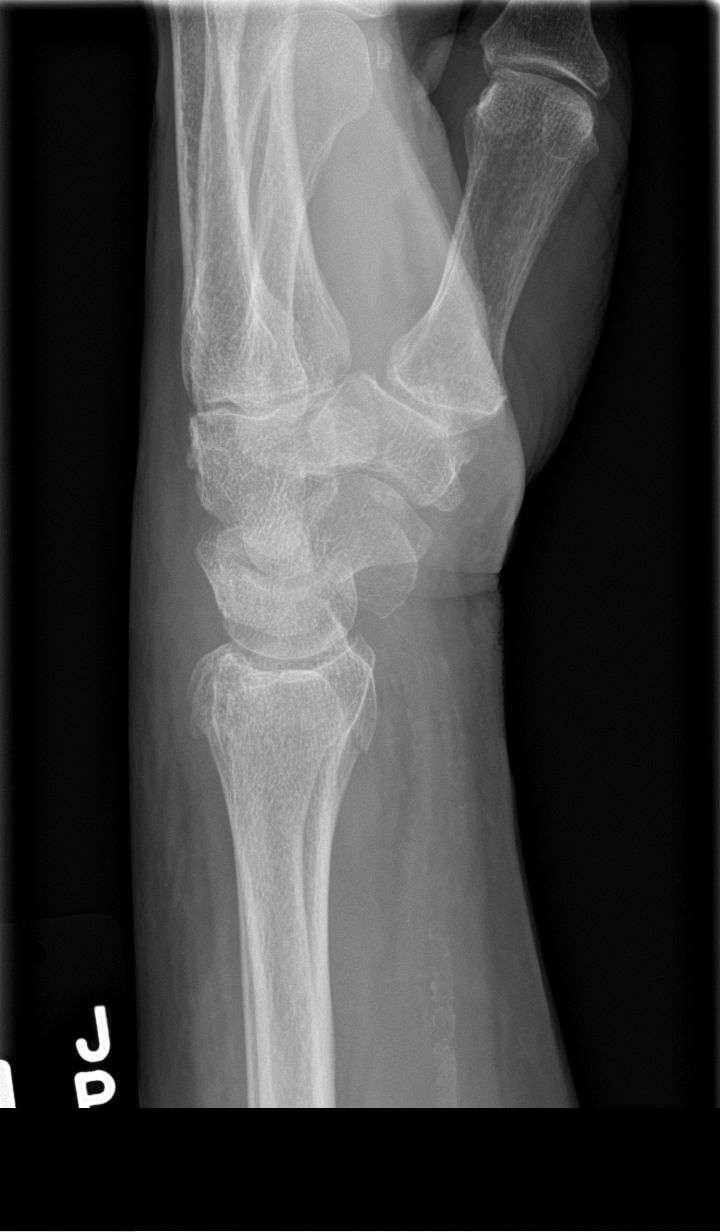

[wrist navicular]
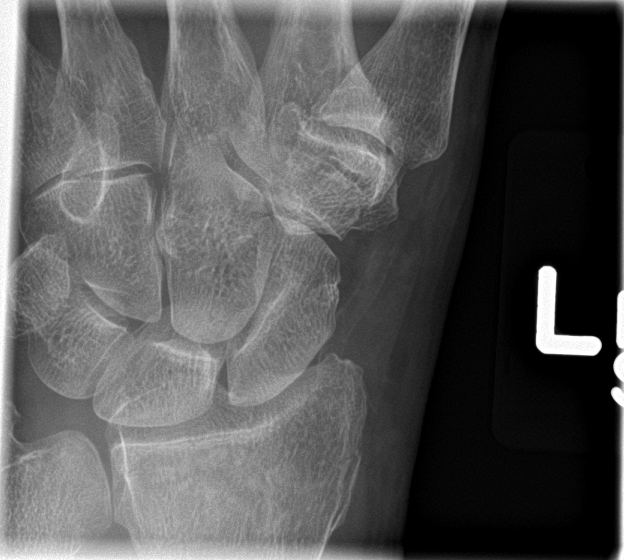

[4 of 4 positions shown; findings below may reference images not displayed]

FINDINGS: Distal left radial nondisplaced fracture is noted. This is best
demonstrated on left wrist series is post forearm series. The
fracture is minimally displaced. Slight displaced ulnar styloid
fracture noted. Diffuse degenerative change. Peripheral vascular
calcification .
IMPRESSION: 1. Slightly displaced distal left radial metaphysis fracture. Slight
displaced ulnar styloid fracture.

2. Peripheral vascular disease.

## 2016-08-26 IMAGING — CR DG FOREARM 2V*L*
3 series · 3 of 3 positions shown · non-contrast
Comparison: No recent prior.

CLINICAL DATA: Fall.  Swelling.

EXAM:
LEFT FOREARM - 2 VIEW

[forearm ap]
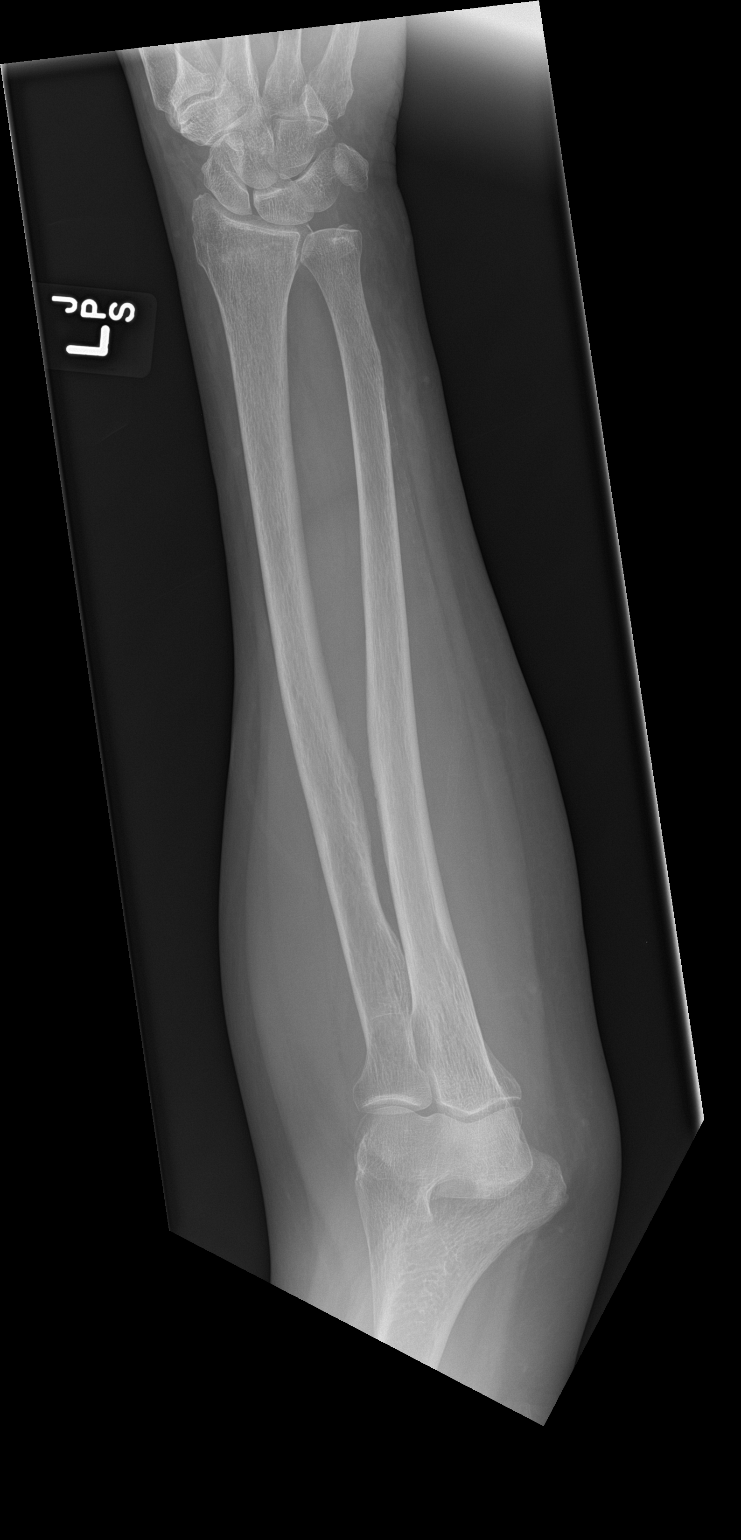

[forearm lat (1 of 2)]
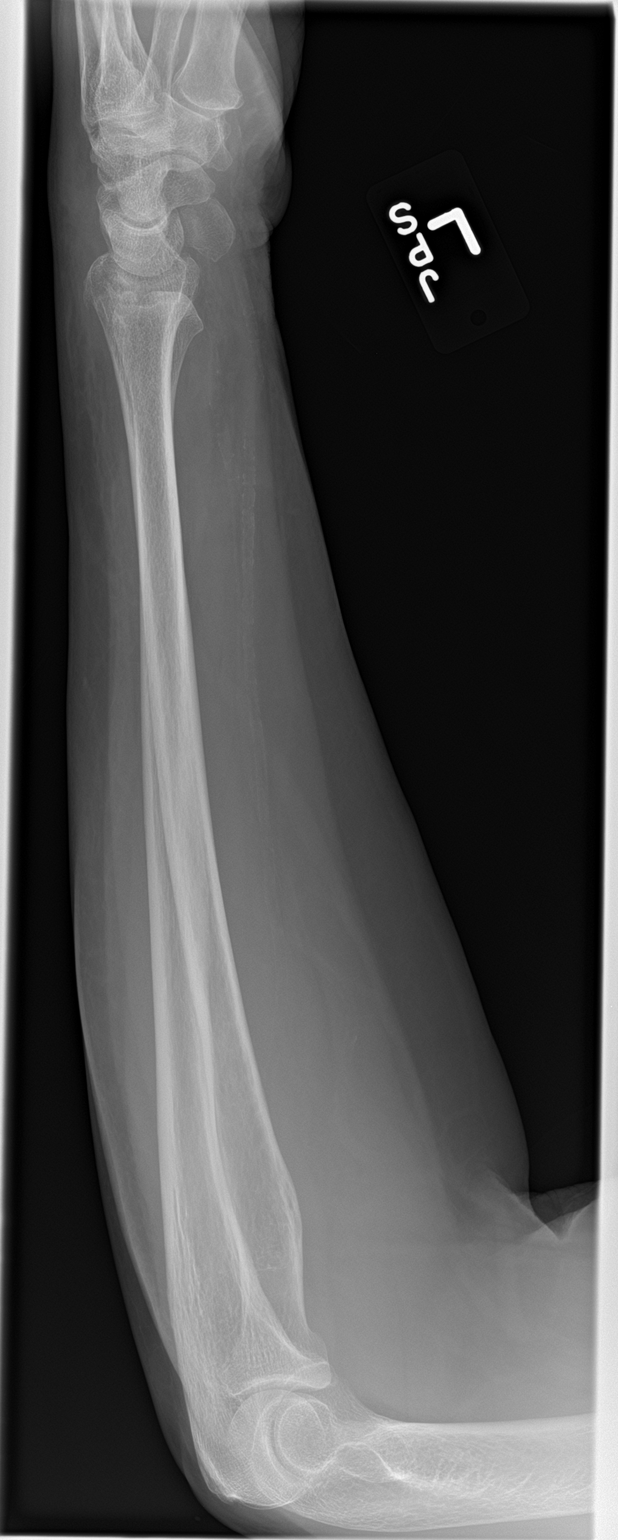

[forearm lat (2 of 2)]
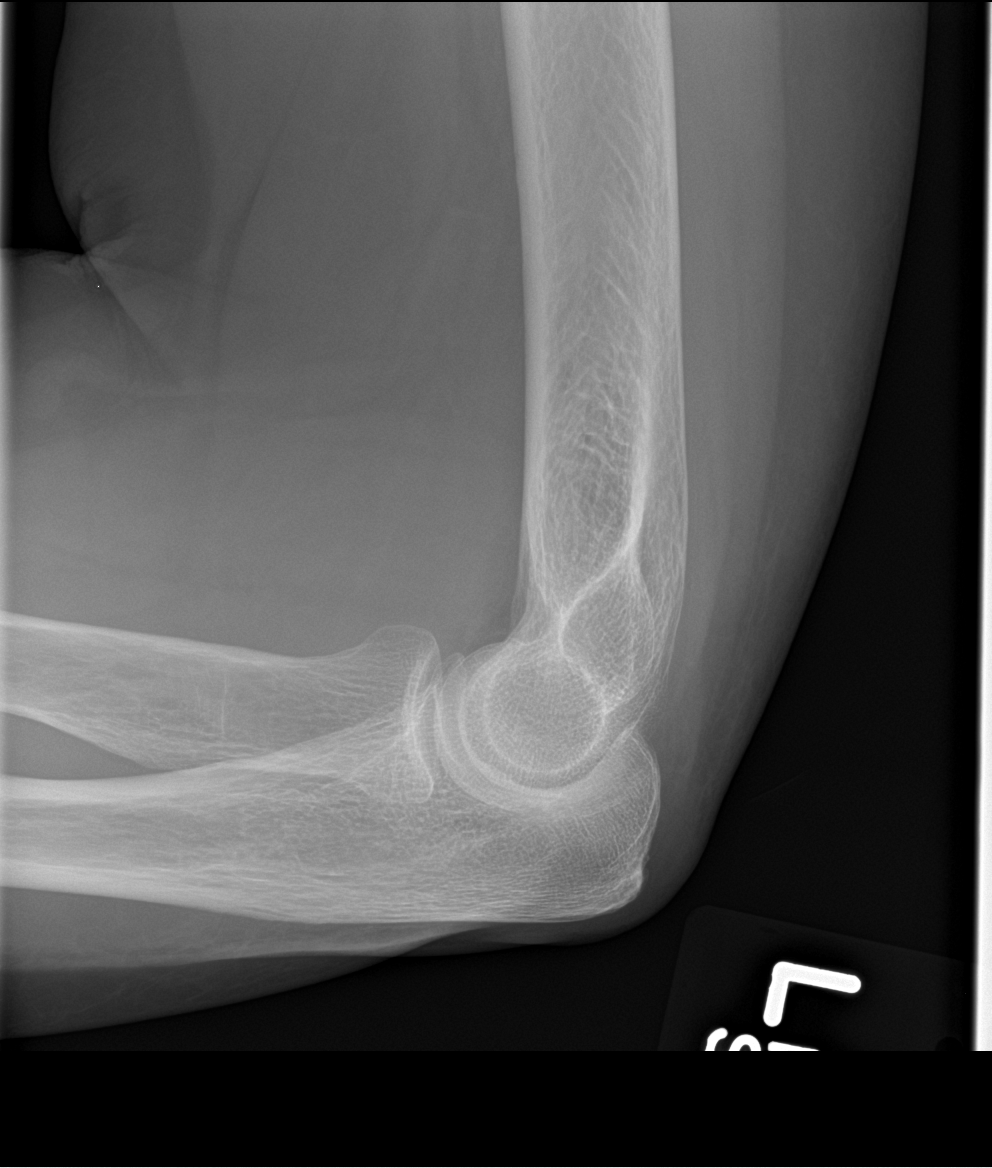

[3 of 3 positions shown; findings below may reference images not displayed]

FINDINGS: Distal left radial fracture cannot be excluded. Diffuse degenerative
change present about the elbow on wrist. No other focal bony
abnormality. Peripheral vascular calcification
IMPRESSION: 1.  Distal left radial fracture cannot be excluded.

2. Peripheral vascular disease.

## 2016-09-01 ENCOUNTER — Other Ambulatory Visit: Payer: Self-pay | Admitting: Internal Medicine

## 2016-09-02 DIAGNOSIS — H10413 Chronic giant papillary conjunctivitis, bilateral: Secondary | ICD-10-CM | POA: Diagnosis not present

## 2016-09-11 ENCOUNTER — Ambulatory Visit (INDEPENDENT_AMBULATORY_CARE_PROVIDER_SITE_OTHER): Payer: Medicare Other

## 2016-09-11 DIAGNOSIS — Z952 Presence of prosthetic heart valve: Secondary | ICD-10-CM

## 2016-09-11 DIAGNOSIS — Z5181 Encounter for therapeutic drug level monitoring: Secondary | ICD-10-CM

## 2016-09-11 DIAGNOSIS — I482 Chronic atrial fibrillation, unspecified: Secondary | ICD-10-CM

## 2016-09-11 LAB — POCT INR: INR: 3

## 2016-09-17 ENCOUNTER — Ambulatory Visit (INDEPENDENT_AMBULATORY_CARE_PROVIDER_SITE_OTHER): Payer: Medicare Other | Admitting: Family Medicine

## 2016-09-17 ENCOUNTER — Encounter: Payer: Self-pay | Admitting: Family Medicine

## 2016-09-17 VITALS — BP 168/98 | HR 66 | Temp 98.1°F | Ht 68.0 in | Wt 165.4 lb

## 2016-09-17 DIAGNOSIS — I482 Chronic atrial fibrillation, unspecified: Secondary | ICD-10-CM

## 2016-09-17 DIAGNOSIS — S82142E Displaced bicondylar fracture of left tibia, subsequent encounter for open fracture type I or II with routine healing: Secondary | ICD-10-CM

## 2016-09-17 DIAGNOSIS — I10 Essential (primary) hypertension: Secondary | ICD-10-CM | POA: Diagnosis not present

## 2016-09-18 NOTE — Assessment & Plan Note (Signed)
Did not take her BP meds this morning but reports taking them most days. Will not make med changes. She will send me some BP readings.

## 2016-09-18 NOTE — Assessment & Plan Note (Signed)
Still some knee stiffness but overall doing well

## 2016-09-18 NOTE — Assessment & Plan Note (Signed)
Continues on her anticoagulation. No chest pain. Seems rate controlled and is fairly active.No med changes.

## 2016-09-18 NOTE — Progress Notes (Signed)
    CHIEF COMPLAINT / HPI: 1. HTN: taking meds regularly except this AM when her [employer] bus showed up 3 hours early and she had to rush to catch it for our appt. No chest pain. 2. A FIB: no symptoms. Has been a little more active over last few months. Feel slike her knee/leg is better---still occasionally stiff but less limiting to activity. 3. Continues on anticoagulation with no bleeding or bruising. Occasionally she feels like she can hear her heart valve click and worries others can hear it butt no other problems.  REVIEW OF SYSTEMS: See HPI. Additionally weight is stable, appetite is good. Feels like mood and outlook stable and positive. No confusion. Eyesight still probelmatic but improved with new gtt from opthalmologist.  OBJECTIVE:  Vital signs are reviewed.  Vital signs reviewed. GENERAL: Well-developed, well-nourished, no acute distress. CARDIOVASCULAR: Regular rate and rhythm no murmur gallop or rub. Faint valvular click . LUNGS: Clear to auscultation bilaterally, no rales or wheeze. ABDOMEN: Soft positive bowel sounds NEURO: No gross focal neurological deficits.Vision not tested. MSK: Movement of extremity x 4.Some stiffness LLE at knee bit tibial plateau fracture site is nontender. Well healed skin scar.Lacks full extension of that knee by about 10 degrees.    ASSESSMENT / PLAN: Please see problem oriented charting for details

## 2016-09-23 DIAGNOSIS — H401133 Primary open-angle glaucoma, bilateral, severe stage: Secondary | ICD-10-CM | POA: Diagnosis not present

## 2016-10-01 ENCOUNTER — Other Ambulatory Visit: Payer: Self-pay | Admitting: *Deleted

## 2016-10-01 MED ORDER — HYDROCHLOROTHIAZIDE 25 MG PO TABS: 25.0000 mg | ORAL_TABLET | Freq: Every day | ORAL | 3 refills | Status: DC

## 2016-10-15 ENCOUNTER — Ambulatory Visit (INDEPENDENT_AMBULATORY_CARE_PROVIDER_SITE_OTHER): Payer: Medicare Other | Admitting: *Deleted

## 2016-10-15 DIAGNOSIS — I482 Chronic atrial fibrillation, unspecified: Secondary | ICD-10-CM

## 2016-10-15 DIAGNOSIS — Z5181 Encounter for therapeutic drug level monitoring: Secondary | ICD-10-CM | POA: Diagnosis not present

## 2016-10-15 DIAGNOSIS — Z952 Presence of prosthetic heart valve: Secondary | ICD-10-CM

## 2016-10-15 LAB — POCT INR: INR: 3.8

## 2016-10-17 ENCOUNTER — Telehealth: Payer: Self-pay | Admitting: Family Medicine

## 2016-10-17 NOTE — Telephone Encounter (Signed)
Clinical info completed on DMV form.  Place form in Dr. Donnetta Hail box for completion.  Lamonte Sakai, Jacklynn Dehaas D, [place]

## 2016-10-17 NOTE — Telephone Encounter (Signed)
DMV form dropped off for at front desk for completion.  Verified that patient section of form has been completed.  Last DOS/WCC with PCP was.09/17/16  Placed form in team folder to be completed by clinical staff.   Patient said it wasn't completely filled out first time (? Not sure)  [name]

## 2016-10-21 DIAGNOSIS — H401133 Primary open-angle glaucoma, bilateral, severe stage: Secondary | ICD-10-CM | POA: Diagnosis not present

## 2016-10-24 NOTE — Telephone Encounter (Signed)
Pt is calling to check the status of the DMV forms she dropped of last week. She wants to make sure that all the Yes or no questions are checked and that the doctor signs off on this, She would also like [facility] [place] [facility] and leave the original up front for her to pick up. Please call patient and give her an update. jw

## 2016-10-27 NOTE — Telephone Encounter (Signed)
[name] This is done and coming to your desk Please fax and either send her a ciopy or leave a copy up front THANKS! Denny Levy

## 2016-10-27 NOTE — Telephone Encounter (Signed)
Patient advised that DMV form is complete.  Faxed per request and copied to scan in records. Original copy placed up front for pickup.  Clovis Pu, RN

## 2016-10-31 ENCOUNTER — Ambulatory Visit: Payer: Medicare Other | Admitting: Podiatry

## 2016-11-05 ENCOUNTER — Ambulatory Visit (INDEPENDENT_AMBULATORY_CARE_PROVIDER_SITE_OTHER): Payer: Medicare Other | Admitting: *Deleted

## 2016-11-05 DIAGNOSIS — Z952 Presence of prosthetic heart valve: Secondary | ICD-10-CM

## 2016-11-05 DIAGNOSIS — I482 Chronic atrial fibrillation, unspecified: Secondary | ICD-10-CM

## 2016-11-05 DIAGNOSIS — I472 Ventricular tachycardia: Secondary | ICD-10-CM

## 2016-11-05 DIAGNOSIS — Z5181 Encounter for therapeutic drug level monitoring: Secondary | ICD-10-CM | POA: Diagnosis not present

## 2016-11-05 DIAGNOSIS — I4729 Other ventricular tachycardia: Secondary | ICD-10-CM

## 2016-11-05 LAB — POCT INR: INR: 3.6

## 2016-11-10 ENCOUNTER — Telehealth: Payer: Self-pay | Admitting: Internal Medicine

## 2016-11-10 DIAGNOSIS — Z952 Presence of prosthetic heart valve: Secondary | ICD-10-CM

## 2016-11-10 MED ORDER — AMOXICILLIN 500 MG PO TABS: ORAL_TABLET | ORAL | 0 refills | Status: DC

## 2016-11-10 NOTE — Telephone Encounter (Signed)
Pt calling requesting a Rx for a Antibiotic for a dental appt the pt is having. Pt would like a call back. Please address

## 2016-11-10 NOTE — Telephone Encounter (Signed)
Pt with mechanical valve.  Per pharmacist-Pt to take 4 tabs (500 mg) amoxicillin by mouth once one hour prior to procedure.  Order sent to Pt pharmacist.  Pt indicates understanding.

## 2016-11-21 ENCOUNTER — Ambulatory Visit (INDEPENDENT_AMBULATORY_CARE_PROVIDER_SITE_OTHER): Payer: Medicare Other | Admitting: *Deleted

## 2016-11-21 DIAGNOSIS — I482 Chronic atrial fibrillation, unspecified: Secondary | ICD-10-CM

## 2016-11-21 DIAGNOSIS — Z952 Presence of prosthetic heart valve: Secondary | ICD-10-CM | POA: Diagnosis not present

## 2016-11-21 DIAGNOSIS — Z5181 Encounter for therapeutic drug level monitoring: Secondary | ICD-10-CM

## 2016-11-21 LAB — POCT INR: INR: 2.1

## 2016-11-28 ENCOUNTER — Other Ambulatory Visit: Payer: Self-pay | Admitting: Internal Medicine

## 2016-12-03 DIAGNOSIS — R350 Frequency of micturition: Secondary | ICD-10-CM | POA: Diagnosis not present

## 2016-12-03 DIAGNOSIS — N3000 Acute cystitis without hematuria: Secondary | ICD-10-CM | POA: Diagnosis not present

## 2016-12-05 ENCOUNTER — Ambulatory Visit (INDEPENDENT_AMBULATORY_CARE_PROVIDER_SITE_OTHER): Payer: Medicare Other | Admitting: *Deleted

## 2016-12-05 DIAGNOSIS — Z5181 Encounter for therapeutic drug level monitoring: Secondary | ICD-10-CM | POA: Diagnosis not present

## 2016-12-05 DIAGNOSIS — Z952 Presence of prosthetic heart valve: Secondary | ICD-10-CM

## 2016-12-05 DIAGNOSIS — I482 Chronic atrial fibrillation, unspecified: Secondary | ICD-10-CM

## 2016-12-05 LAB — POCT INR: INR: 3.1

## 2016-12-05 NOTE — Patient Instructions (Signed)
Continue taking same dose of Coumadin 1/2 tablet every day except 1 tablet on Mondays and Fridays. Repeat INR in 3 weeks. Call if placed on any new medications or if scheduled for any procedures [phone]

## 2016-12-10 ENCOUNTER — Ambulatory Visit: Payer: Medicare Other | Admitting: Family Medicine

## 2016-12-18 DIAGNOSIS — H401133 Primary open-angle glaucoma, bilateral, severe stage: Secondary | ICD-10-CM | POA: Diagnosis not present

## 2017-01-07 ENCOUNTER — Ambulatory Visit (INDEPENDENT_AMBULATORY_CARE_PROVIDER_SITE_OTHER): Payer: Medicare Other | Admitting: Pharmacist

## 2017-01-07 DIAGNOSIS — Z5181 Encounter for therapeutic drug level monitoring: Secondary | ICD-10-CM | POA: Diagnosis not present

## 2017-01-07 DIAGNOSIS — Z952 Presence of prosthetic heart valve: Secondary | ICD-10-CM | POA: Diagnosis not present

## 2017-01-07 DIAGNOSIS — I482 Chronic atrial fibrillation, unspecified: Secondary | ICD-10-CM

## 2017-01-07 LAB — POCT INR: INR: 3.6

## 2017-01-07 NOTE — Patient Instructions (Signed)
Description   Skip Coumadin dose today then continue taking same dose of Coumadin 1/2 tablet every day except 1 tablet on Mondays and Fridays. Repeat INR in 3 weeks. Call if placed on any new medications or if scheduled for any procedures [phone]

## 2017-01-28 ENCOUNTER — Ambulatory Visit (INDEPENDENT_AMBULATORY_CARE_PROVIDER_SITE_OTHER): Payer: Medicare Other | Admitting: *Deleted

## 2017-01-28 DIAGNOSIS — I4729 Other ventricular tachycardia: Secondary | ICD-10-CM

## 2017-01-28 DIAGNOSIS — I482 Chronic atrial fibrillation, unspecified: Secondary | ICD-10-CM

## 2017-01-28 DIAGNOSIS — I472 Ventricular tachycardia: Secondary | ICD-10-CM | POA: Diagnosis not present

## 2017-01-28 DIAGNOSIS — Z952 Presence of prosthetic heart valve: Secondary | ICD-10-CM | POA: Diagnosis not present

## 2017-01-28 DIAGNOSIS — Z5181 Encounter for therapeutic drug level monitoring: Secondary | ICD-10-CM | POA: Diagnosis not present

## 2017-01-28 LAB — POCT INR: INR: 3

## 2017-01-28 NOTE — Patient Instructions (Signed)
Description   Continue taking same dose of Coumadin 1/2 tablet every day except 1 tablet on Mondays and Fridays. Repeat INR in 4 weeks. Call if placed on any new medications or if scheduled for any procedures [phone]

## 2017-02-13 DIAGNOSIS — H401133 Primary open-angle glaucoma, bilateral, severe stage: Secondary | ICD-10-CM | POA: Diagnosis not present

## 2017-02-23 ENCOUNTER — Ambulatory Visit (INDEPENDENT_AMBULATORY_CARE_PROVIDER_SITE_OTHER): Payer: Medicare Other | Admitting: *Deleted

## 2017-02-23 DIAGNOSIS — Z5181 Encounter for therapeutic drug level monitoring: Secondary | ICD-10-CM

## 2017-02-23 DIAGNOSIS — I482 Chronic atrial fibrillation, unspecified: Secondary | ICD-10-CM

## 2017-02-23 DIAGNOSIS — Z952 Presence of prosthetic heart valve: Secondary | ICD-10-CM

## 2017-02-23 LAB — POCT INR: INR: 2.8

## 2017-02-23 NOTE — Patient Instructions (Signed)
Description   Continue taking same dose of Coumadin 1/2 tablet every day except 1 tablet on Mondays and Fridays. Repeat INR in 4 weeks. Call if placed on any new medications or if scheduled for any procedures [phone]     

## 2017-03-09 ENCOUNTER — Ambulatory Visit: Payer: Medicare Other | Admitting: Podiatry

## 2017-03-20 ENCOUNTER — Ambulatory Visit: Payer: Medicare Other | Admitting: Podiatry

## 2017-03-26 ENCOUNTER — Ambulatory Visit (INDEPENDENT_AMBULATORY_CARE_PROVIDER_SITE_OTHER): Payer: Medicare Other | Admitting: *Deleted

## 2017-03-26 ENCOUNTER — Encounter (INDEPENDENT_AMBULATORY_CARE_PROVIDER_SITE_OTHER): Payer: Self-pay

## 2017-03-26 DIAGNOSIS — Z5181 Encounter for therapeutic drug level monitoring: Secondary | ICD-10-CM | POA: Diagnosis not present

## 2017-03-26 DIAGNOSIS — Z952 Presence of prosthetic heart valve: Secondary | ICD-10-CM | POA: Diagnosis not present

## 2017-03-26 DIAGNOSIS — I482 Chronic atrial fibrillation, unspecified: Secondary | ICD-10-CM

## 2017-03-26 LAB — POCT INR: INR: 3

## 2017-03-26 NOTE — Patient Instructions (Signed)
Description   Continue taking same dose of Coumadin 1/2 tablet every day except 1 tablet on Mondays and Fridays. Repeat INR in 6 weeks. Call if placed on any new medications or if scheduled for any procedures [phone]

## 2017-03-30 ENCOUNTER — Ambulatory Visit: Payer: Medicare Other | Admitting: Student

## 2017-03-31 ENCOUNTER — Ambulatory Visit: Payer: Medicare Other | Admitting: Student

## 2017-04-13 ENCOUNTER — Ambulatory Visit (INDEPENDENT_AMBULATORY_CARE_PROVIDER_SITE_OTHER): Payer: Medicare Other

## 2017-04-13 ENCOUNTER — Ambulatory Visit (INDEPENDENT_AMBULATORY_CARE_PROVIDER_SITE_OTHER): Payer: Medicare Other | Admitting: Podiatry

## 2017-04-13 ENCOUNTER — Encounter: Payer: Self-pay | Admitting: Podiatry

## 2017-04-13 VITALS — BP 193/116 | HR 80 | Resp 16

## 2017-04-13 DIAGNOSIS — M2010 Hallux valgus (acquired), unspecified foot: Secondary | ICD-10-CM

## 2017-04-13 DIAGNOSIS — M2041 Other hammer toe(s) (acquired), right foot: Secondary | ICD-10-CM

## 2017-04-13 DIAGNOSIS — D689 Coagulation defect, unspecified: Secondary | ICD-10-CM | POA: Diagnosis not present

## 2017-04-13 DIAGNOSIS — M2042 Other hammer toe(s) (acquired), left foot: Secondary | ICD-10-CM | POA: Diagnosis not present

## 2017-04-13 DIAGNOSIS — L84 Corns and callosities: Secondary | ICD-10-CM | POA: Diagnosis not present

## 2017-04-13 NOTE — Progress Notes (Signed)
Subjective:   Patient ID: Tami MatterSally M [name], female   DOB: 82 y.o.   MRN: [id number]   HPI Patient presents with calluses underneath the first metatarsal of both feet in the big toes and the second metatarsal that are sore.  She is on blood thinner and was concerned about that and she is in relatively poor health   Review of Systems  All other systems reviewed and are negative.       Objective:  Physical Exam  Constitutional: She appears well-developed and well-nourished.  Cardiovascular: Intact distal pulses.  Pulmonary/Chest: Effort normal.  Musculoskeletal: Normal range of motion.  Neurological: She is alert.  Skin: Skin is warm.  Nursing note and vitals reviewed.   Neurovascular status found to be intact muscle strength was adequate range of motion was moderately reduced subtalar midtarsal joint.  Patient has significant keratotic lesions underneath the second metatarsal bilateral slightly on the first metatarsal and big toe and they are painful when pressed and thick.  Patient also is on blood thinner     Assessment:  At risk patient with severe keratotic lesion formation that are painful     Plan:  H&P and deep debridement accomplished of lesions with no iatrogenic bleeding and reappoint for routine care.  X-rays indicated mild osteoporosis arthritis but no signs of other pathology

## 2017-04-14 ENCOUNTER — Encounter: Payer: Self-pay | Admitting: Family Medicine

## 2017-04-14 DIAGNOSIS — Z0000 Encounter for general adult medical examination without abnormal findings: Secondary | ICD-10-CM | POA: Insufficient documentation

## 2017-04-22 ENCOUNTER — Encounter: Payer: Self-pay | Admitting: Family Medicine

## 2017-04-28 ENCOUNTER — Other Ambulatory Visit: Payer: Self-pay | Admitting: Internal Medicine

## 2017-04-29 ENCOUNTER — Ambulatory Visit: Payer: Medicare Other | Admitting: Family Medicine

## 2017-05-07 ENCOUNTER — Ambulatory Visit (INDEPENDENT_AMBULATORY_CARE_PROVIDER_SITE_OTHER): Payer: Medicare Other | Admitting: *Deleted

## 2017-05-07 DIAGNOSIS — I482 Chronic atrial fibrillation, unspecified: Secondary | ICD-10-CM

## 2017-05-07 DIAGNOSIS — Z5181 Encounter for therapeutic drug level monitoring: Secondary | ICD-10-CM | POA: Diagnosis not present

## 2017-05-07 DIAGNOSIS — Z952 Presence of prosthetic heart valve: Secondary | ICD-10-CM

## 2017-05-07 LAB — POCT INR: INR: 1.6

## 2017-05-07 NOTE — Patient Instructions (Signed)
Description   Today take 1 tablet and tomorrow take 1.5 tablets then continue taking same dose of Coumadin 1/2 tablet every day except 1 tablet on Mondays and Fridays. Repeat INR in 11 days with MD appt. Call if placed on any new medications or if scheduled for any procedures [phone]

## 2017-05-18 ENCOUNTER — Ambulatory Visit (INDEPENDENT_AMBULATORY_CARE_PROVIDER_SITE_OTHER): Payer: Medicare Other | Admitting: *Deleted

## 2017-05-18 ENCOUNTER — Ambulatory Visit (INDEPENDENT_AMBULATORY_CARE_PROVIDER_SITE_OTHER): Payer: Medicare Other | Admitting: Internal Medicine

## 2017-05-18 ENCOUNTER — Other Ambulatory Visit: Payer: Self-pay | Admitting: Family Medicine

## 2017-05-18 ENCOUNTER — Encounter: Payer: Self-pay | Admitting: Internal Medicine

## 2017-05-18 VITALS — BP 164/100 | HR 72 | Ht 67.5 in | Wt 178.8 lb

## 2017-05-18 DIAGNOSIS — I472 Ventricular tachycardia: Secondary | ICD-10-CM | POA: Diagnosis not present

## 2017-05-18 DIAGNOSIS — Z5181 Encounter for therapeutic drug level monitoring: Secondary | ICD-10-CM | POA: Diagnosis not present

## 2017-05-18 DIAGNOSIS — Z952 Presence of prosthetic heart valve: Secondary | ICD-10-CM

## 2017-05-18 DIAGNOSIS — I482 Chronic atrial fibrillation, unspecified: Secondary | ICD-10-CM

## 2017-05-18 DIAGNOSIS — I4729 Other ventricular tachycardia: Secondary | ICD-10-CM

## 2017-05-18 DIAGNOSIS — R011 Cardiac murmur, unspecified: Secondary | ICD-10-CM

## 2017-05-18 LAB — POCT INR: INR: 2.6

## 2017-05-18 NOTE — Progress Notes (Signed)
HPI [name] returns today for followup. She is a very pleasant 82 year old woman with a history of mitral valve disease status post mitral valve replacement, chronic atrial fibrillation, hypertension, nonsustained ventricular tachycardia, also with a history of epistaxis. In the interim, she has done well. She is walking regularly, and exercising. She admits to sodium indiscretion however. No syncope. She is pending colonoscopy.   Allergies  Allergen Reactions  . Esomeprazole Magnesium     REACTION: stomach upset, nausea     Current Outpatient Medications  Medication Sig Dispense Refill  . Acetaminophen 500 MG coapsule Take 650 mg by mouth every 6 (six) hours as needed for fever.    Marland Kitchen amoxicillin (AMOXIL) 500 MG tablet Take 4 tablets (500 mg) by mouth once one hour prior to dental work. 4 tablet 0  . Cholecalciferol (VITAMIN D) 2000 UNITS tablet Take 2,000 Units by mouth daily.    . digoxin (DIGOX) 0.125 MG tablet Take 1 tablet (0.125 mg total) by mouth every other day. Please keep 4/29 upcoming appointment for additional refills thanks. 45 tablet 0  . diltiazem (CARDIZEM CD) 240 MG 24 hr capsule Take 1 capsule (240 mg total) by mouth daily. 90 capsule 3  . dorzolamidel-timolol (COSOPT) 22.3-6.8 MG/ML SOLN ophthalmic solution INT 1 GTT IN OU BID  4  . fluorometholone (FML) 0.1 % ophthalmic suspension Place 1 drop into both eyes daily.    . hydrochlorothiazide (HYDRODIURIL) 25 MG tablet Take 1 tablet (25 mg total) by mouth daily. 90 tablet 3  . HYDROcodone-acetaminophen (NORCO/VICODIN) 5-325 MG tablet Take 1-2 tablets by mouth every 6 (six) hours as needed for severe pain. 10 tablet 0  . LUMIGAN 0.01 % SOLN INT 1 GTT INTO OU QHS  0  . metoprolol tartrate (LOPRESSOR) 50 MG tablet TAKE 1 1/2 TABLETS BY MOUTH TWICE DAILY 90 tablet 11  . mirabegron ER (MYRBETRIQ) 25 MG TB24 tablet Take 25 mg by mouth daily.    . pantoprazole (PROTONIX) 40 MG tablet Take 40 mg by mouth daily.     .  polyethylene glycol (MIRALAX / GLYCOLAX) packet Take 17 g by mouth 2 (two) times daily. 14 each 0  . TRAVATAN Z 0.004 % SOLN ophthalmic solution Place 1 drop into both eyes at bedtime.   12  . warfarin (COUMADIN) 10 MG tablet TAKE AS DIRECTED BY COUMADIN CLINIC 90 tablet 1   No current facility-administered medications for this visit.    Facility-Administered Medications Ordered in Other Visits  Medication Dose Route Frequency Provider Last Rate Last Dose  . mitoMYcin (MUTAMYCIN) Injection Use in OR only (0.4 mg/ml)  0.5 mL Right Eye Once Chalmers Guest, MD         Past Medical History:  Diagnosis Date  . Asthma    years ago  . Chronic atrial fibrillation (HCC)   . Dilated cardiomyopathy (HCC)    history of this, now resolved  . Dysphagia    Hx  . Fracture of tibial plateau 05/21/2015  . GERD (gastroesophageal reflux disease)   . GI bleed   . Hemorrhoid 04/2014   bleeding  . History of blood transfusion   . Hypertension   . Microcytic anemia   . Nonsustained ventricular tachycardia (HCC)   . Osteoporosis   . Protein in urine   . RBBB (right bundle branch block)   . Rheumatic mitral valve disease     ROS:   All systems reviewed and negative except as noted in the HPI.  Past Surgical History:  Procedure Laterality Date  . CHOLECYSTECTOMY    . COLONOSCOPY    . COLONOSCOPY W/ POLYPECTOMY    . MITOMYCIN C APPLICATION Right 05/17/2014   Procedure: MITOMYCIN C APPLICATION;  Surgeon: Chalmers Guest, MD;  Location: [facility] OR;  Service: Ophthalmology;  Laterality: Right;  . MITRAL VALVE REPLACEMENT  1987   St Jude mechanical valve  . TRABECULECTOMY Right 05/17/2014   Procedure: TRABECULECTOMY WITH MITOMYCIN RIGHT EYE;  Surgeon: Chalmers Guest, MD;  Location: [facility] OR;  Service: Ophthalmology;  Laterality: Right;  . TUBAL LIGATION    . US ECHOCARDIOGRAPHY  02/2008, 03/2006, 06/2004     Family History  Problem Relation Age of Onset  . Cancer Father   . Hypertension Mother   . Stroke  Mother   . Cancer Sister        in the Bladder  . Endometriosis Daughter   . Stroke Sister   . Blindness Sister   . Hypertension Sister   . Thyroid disease Daughter   . Hypertension Daughter   . Hypertension Daughter   . Hypertension Daughter   . Liver disease Daughter   . Colon cancer Neg Hx   . Breast cancer Neg Hx      Social History   Socioeconomic History  . Marital status: Divorced    Spouse name: Not on file  . Number of children: Not on file  . Years of education: Not on file  . Highest education level: Not on file  Occupational History  . Occupation: Retired  Engineer, production  . Financial resource strain: Not on file  . Food insecurity:    Worry: Not on file    Inability: Not on file  . Transportation needs:    Medical: Not on file    Non-medical: Not on file  Tobacco Use  . Smoking status: Former Smoker    Packs/day: 1.50    Years: 15.00    Pack years: 22.50  . Smokeless tobacco: Never Used  . Tobacco comment: quiit 1987  Substance and Sexual Activity  . Alcohol use: No  . Drug use: No  . Sexual activity: Not on file  Lifestyle  . Physical activity:    Days per week: Not on file    Minutes per session: Not on file  . Stress: Not on file  Relationships  . Social connections:    Talks on phone: Not on file    Gets together: Not on file    Attends religious service: Not on file    Active member of club or organization: Not on file    Attends meetings of clubs or organizations: Not on file    Relationship status: Not on file  . Intimate partner violence:    Fear of current or ex partner: Not on file    Emotionally abused: Not on file    Physically abused: Not on file    Forced sexual activity: Not on file  Other Topics Concern  . Not on file  Social History Narrative   Was living alone until she fell and broke her leg.   duaghter is [employer]     BP (!) 164/100   Pulse 72   Ht 5' 7.5" (1.715 m)   Wt 178 lb 12.8 oz (81.1 kg)   SpO2 98%    BMI 27.59 kg/m   Physical Exam:  Well appearing NAD HEENT: Unremarkable Neck:  No JVD, no thyromegally Lymphatics:  No adenopathy Back:  No CVA tenderness Lungs:  Clear with no wheezes HEART:  Regular rate rhythm, 1/6 systolic murmur, no rubs, no clicks Abd:  soft, positive bowel sounds, no organomegally, no rebound, no guarding Ext:  2 plus pulses, no edema, no cyanosis, no clubbing Skin:  No rashes no nodules Neuro:  CN II through XII intact, motor grossly intact  EKG - atrial fib with RBBB   Assess/Plan: 1. Atrial fib - her rate is well controlled. She will continue her current meds. 2. VT - she has not had any more symptomatic VT since her last visit. No change.  3. Coags - she is pending colonoscopy and will need a lovenox overlap. 4. HTN - her blood pressure is elevated. She admits to some dietary indiscretion and she did not take her meds this morning. She will need to moitor her pressures and we will consider uptitration when I see her back.  5. Murmur with prior MVR - she is pending a 2D echo.   Leonia Reeves.D.

## 2017-05-18 NOTE — Patient Instructions (Signed)
Description   Continue taking same dose of Coumadin 1/2 tablet every day except 1 tablet on Mondays and Fridays. Repeat INR in 2 weeks  Call if placed on any new medications or if scheduled for any procedures [phone] Call when colonoscopy is scheduled

## 2017-05-18 NOTE — Patient Instructions (Addendum)
Medication Instructions:  Your physician recommends that you continue on your current medications as directed. Please refer to the Current Medication list given to you today.  Labwork: None ordered.  Testing/Procedures: Your physician has requested that you have an echocardiogram. Echocardiography is a painless test that uses sound waves to create images of your heart. It provides your doctor with information about the size and shape of your heart and how well your heart's chambers and valves are working. This procedure takes approximately one hour. There are no restrictions for this procedure.  Please schedule for an ECHO on 06/01/2017  Follow-Up: Your physician wants you to follow-up in: one year with Dr. [name].   You will receive a reminder letter in the mail two months in advance. If you don't receive a letter, please call our office to schedule the follow-up appointment.  Any Other Special Instructions Will Be Listed Below (If Applicable).  If you need a refill on your cardiac medications before your next appointment, please call your pharmacy.

## 2017-05-21 ENCOUNTER — Telehealth: Payer: Self-pay

## 2017-05-21 NOTE — Telephone Encounter (Signed)
Patient called nurse line requesting PCP to fill Pantopraozle 40 mg once daily. She no longer sees the doctor that prescribed it. Uses [facility] on [address]. Ples Specter, RN [facility] RN)

## 2017-05-22 MED ORDER — PANTOPRAZOLE SODIUM 40 MG PO TBEC: 40.0000 mg | DELAYED_RELEASE_TABLET | Freq: Every day | ORAL | 0 refills | Status: DC

## 2017-05-26 ENCOUNTER — Other Ambulatory Visit: Payer: Self-pay | Admitting: Family Medicine

## 2017-05-26 DIAGNOSIS — Z1231 Encounter for screening mammogram for malignant neoplasm of breast: Secondary | ICD-10-CM

## 2017-05-27 ENCOUNTER — Ambulatory Visit: Payer: Medicare Other | Admitting: Family Medicine

## 2017-05-31 ENCOUNTER — Other Ambulatory Visit: Payer: Self-pay | Admitting: Internal Medicine

## 2017-06-01 ENCOUNTER — Other Ambulatory Visit: Payer: Self-pay

## 2017-06-01 ENCOUNTER — Ambulatory Visit (HOSPITAL_COMMUNITY): Payer: Medicare Other | Attending: Cardiology

## 2017-06-01 ENCOUNTER — Ambulatory Visit (INDEPENDENT_AMBULATORY_CARE_PROVIDER_SITE_OTHER): Payer: Medicare Other | Admitting: Pharmacist

## 2017-06-01 DIAGNOSIS — Z952 Presence of prosthetic heart valve: Secondary | ICD-10-CM | POA: Diagnosis not present

## 2017-06-01 DIAGNOSIS — I517 Cardiomegaly: Secondary | ICD-10-CM | POA: Diagnosis not present

## 2017-06-01 DIAGNOSIS — I482 Chronic atrial fibrillation, unspecified: Secondary | ICD-10-CM

## 2017-06-01 DIAGNOSIS — I350 Nonrheumatic aortic (valve) stenosis: Secondary | ICD-10-CM | POA: Insufficient documentation

## 2017-06-01 DIAGNOSIS — Z5181 Encounter for therapeutic drug level monitoring: Secondary | ICD-10-CM

## 2017-06-01 DIAGNOSIS — R011 Cardiac murmur, unspecified: Secondary | ICD-10-CM

## 2017-06-01 DIAGNOSIS — I4891 Unspecified atrial fibrillation: Secondary | ICD-10-CM | POA: Insufficient documentation

## 2017-06-01 DIAGNOSIS — I4729 Other ventricular tachycardia: Secondary | ICD-10-CM

## 2017-06-01 DIAGNOSIS — I472 Ventricular tachycardia: Secondary | ICD-10-CM | POA: Insufficient documentation

## 2017-06-01 LAB — POCT INR: INR: 3.1

## 2017-06-01 NOTE — Patient Instructions (Signed)
Description   Continue taking same dose of Coumadin 1/2 tablet every day except 1 tablet on Mondays and Fridays. Repeat INR in 4 weeks.  Call if placed on any new medications or if scheduled for any procedures [phone] Call when colonoscopy is scheduled

## 2017-06-05 DIAGNOSIS — H401133 Primary open-angle glaucoma, bilateral, severe stage: Secondary | ICD-10-CM | POA: Diagnosis not present

## 2017-06-10 ENCOUNTER — Other Ambulatory Visit: Payer: Self-pay

## 2017-06-10 ENCOUNTER — Encounter: Payer: Self-pay | Admitting: Family Medicine

## 2017-06-10 ENCOUNTER — Ambulatory Visit (INDEPENDENT_AMBULATORY_CARE_PROVIDER_SITE_OTHER): Payer: Medicare Other | Admitting: Family Medicine

## 2017-06-10 VITALS — BP 152/84 | HR 65 | Temp 98.2°F | Ht 68.0 in | Wt 177.0 lb

## 2017-06-10 DIAGNOSIS — M810 Age-related osteoporosis without current pathological fracture: Secondary | ICD-10-CM

## 2017-06-10 DIAGNOSIS — N183 Chronic kidney disease, stage 3 unspecified: Secondary | ICD-10-CM

## 2017-06-10 DIAGNOSIS — Z952 Presence of prosthetic heart valve: Secondary | ICD-10-CM | POA: Diagnosis not present

## 2017-06-10 DIAGNOSIS — I10 Essential (primary) hypertension: Secondary | ICD-10-CM | POA: Diagnosis not present

## 2017-06-10 DIAGNOSIS — R350 Frequency of micturition: Secondary | ICD-10-CM | POA: Diagnosis not present

## 2017-06-10 LAB — POCT URINALYSIS DIP (MANUAL ENTRY)
Bilirubin, UA: NEGATIVE
Blood, UA: NEGATIVE
Glucose, UA: NEGATIVE mg/dL
Ketones, POC UA: NEGATIVE mg/dL
Nitrite, UA: NEGATIVE
Protein Ur, POC: 100 mg/dL — AB
Spec Grav, UA: 1.015 (ref 1.010–1.025)
Urobilinogen, UA: 0.2 E.U./dL
pH, UA: 6.5 (ref 5.0–8.0)

## 2017-06-10 LAB — POCT UA - MICROSCOPIC ONLY

## 2017-06-10 MED ORDER — VITAMIN D 50 MCG (2000 UT) PO TABS: 2000.0000 [IU] | ORAL_TABLET | Freq: Every day | ORAL | 3 refills | Status: AC

## 2017-06-10 NOTE — Patient Instructions (Signed)
Seborrheic Dermatitis, Adult Seborrheic dermatitis is a skin disease that causes red, scaly patches. It usually occurs on the scalp, and it is often called dandruff. The patches may appear on other parts of the body. Skin patches tend to appear where there are many oil glands in the skin. Areas of the body that are commonly affected include:  Scalp.  Skin folds of the body.  Ears.  Eyebrows.  Neck.  Face.  Armpits.  The bearded area of men's faces.  The condition may come and go for no known reason, and it is often long-lasting (chronic).   What are the signs or symptoms? Symptoms of this condition include:  Thick scales on the scalp.  Redness on the face or in the armpits.  Skin that is flaky. The flakes may be white or yellow.  Skin that seems oily or dry but is not helped with moisturizers.  Itching or burning in the affected areas.  How is this diagnosed? This condition is diagnosed with a medical history and physical exam. A sample of your skin may be tested (skin biopsy). You may need to see a skin specialist (dermatologist). How is this treated? There is no cure for this condition, but treatment can help to manage the symptoms. You may get treatment to remove scales, lower the risk of skin infection, and reduce swelling or itching. Treatment may include:  Medicated shampoo, soaps, moisturizing creams, or ointments.  Medicated moisturizing creams or ointments.  TRY SELSUN BLUE OVER THE COUNTER SHAMPOO ONCE OR TWICE A WEEK  Let me see you in 4-6 months I will send you a note about your labs

## 2017-06-10 NOTE — Progress Notes (Signed)
    CHIEF COMPLAINT / HPI: Here with her daughter F/u chronic problems--thinks she needs some labs 2. Having some flakiness and itchiness of scalp. Sone gradual thinning of hair over last year. 3. Needs her 'bone pills" refilled 4. Intermttent right posterior shoulder and neck pain--stiff and achy. No specific time it occurs. Does not take anything for it. Does not keep her from doing anything  REVIEW OF SYSTEMS: Denies chest pain, no SOB, no lower extremity edema. Has a strange smell to her urine and may have slight increase in frequency of urination, no burning. No fever. No  Unusual weight loss  PERTINENT  PMH / PSH: I have reviewed the patient's medications, allergies, past medical and surgical history, smoking status and updated in the EMR as appropriate.   OBJECTIVE:  Vital signs reviewed. GENERAL: Well-developed, well-nourished, no acute distress. CARDIOVASCULAR: Regular rate and rhythm. Mechanical valve sound heard.  LUNGS: Clear to auscultation bilaterally, no rales or wheeze. ABDOMEN: Soft positive bowel sounds NEURO: No gross focal neurological deficits. MSK: Movement of extremity x 4. Bilateral shoulders FROm. Mild pain wirth supraspinatus active resistance on the right but full strength. NECK: FROM. Mild ttp right neck muscle and  Right proximal mid trapezius. No LAD. No thyromegally. No carotid bruits SCALP: flaky dandruff. No lesions. Thinning on top and frontal area.   ASSESSMENT / PLAN: 1. Seborrheic dermatitis of scalp. Handout and instructions given. 2. Labs and refills

## 2017-06-11 ENCOUNTER — Encounter: Payer: Self-pay | Admitting: Family Medicine

## 2017-06-11 LAB — LDL CHOLESTEROL, DIRECT: LDL Direct: 100 mg/dL — ABNORMAL HIGH (ref 0–99)

## 2017-06-11 LAB — CMP14+EGFR
ALT: 13 IU/L (ref 0–32)
AST: 22 IU/L (ref 0–40)
Albumin/Globulin Ratio: 1.2 (ref 1.2–2.2)
Albumin: 4.4 g/dL (ref 3.5–4.8)
Alkaline Phosphatase: 53 IU/L (ref 39–117)
BUN/Creatinine Ratio: 12 (ref 12–28)
BUN: 17 mg/dL (ref 8–27)
Bilirubin Total: 0.6 mg/dL (ref 0.0–1.2)
CO2: 25 mmol/L (ref 20–29)
Calcium: 9.9 mg/dL (ref 8.7–10.3)
Chloride: 102 mmol/L (ref 96–106)
Creatinine, Ser: 1.38 mg/dL — ABNORMAL HIGH (ref 0.57–1.00)
GFR calc Af Amer: 42 mL/min/{1.73_m2} — ABNORMAL LOW (ref >59)
GFR calc non Af Amer: 36 mL/min/{1.73_m2} — ABNORMAL LOW (ref >59)
Globulin, Total: 3.8 g/dL (ref 1.5–4.5)
Glucose: 87 mg/dL (ref 65–99)
Potassium: 3.5 mmol/L (ref 3.5–5.2)
Sodium: 142 mmol/L (ref 134–144)
Total Protein: 8.2 g/dL (ref 6.0–8.5)

## 2017-06-12 NOTE — Assessment & Plan Note (Signed)
Refilled her vitamin D (her "bone pills')

## 2017-06-12 NOTE — Assessment & Plan Note (Signed)
Continue anticoagulation and regular INR checks

## 2017-06-12 NOTE — Assessment & Plan Note (Signed)
Continue current meds labs

## 2017-06-25 ENCOUNTER — Other Ambulatory Visit: Payer: Self-pay | Admitting: Internal Medicine

## 2017-07-03 ENCOUNTER — Ambulatory Visit: Payer: Medicare Other

## 2017-07-03 ENCOUNTER — Ambulatory Visit (INDEPENDENT_AMBULATORY_CARE_PROVIDER_SITE_OTHER): Payer: Medicare Other | Admitting: *Deleted

## 2017-07-03 DIAGNOSIS — I482 Chronic atrial fibrillation, unspecified: Secondary | ICD-10-CM

## 2017-07-03 DIAGNOSIS — Z5181 Encounter for therapeutic drug level monitoring: Secondary | ICD-10-CM

## 2017-07-03 DIAGNOSIS — Z952 Presence of prosthetic heart valve: Secondary | ICD-10-CM | POA: Diagnosis not present

## 2017-07-03 LAB — POCT INR: INR: 2.5 (ref 2.0–3.0)

## 2017-07-03 NOTE — Patient Instructions (Signed)
Description   Continue taking same dose of Coumadin 1/2 tablet every day except 1 tablet on Mondays and Fridays. Repeat INR in 6 weeks.  Call if placed on any new medications or if scheduled for any procedures [phone] Call when colonoscopy is scheduled

## 2017-07-17 ENCOUNTER — Telehealth: Payer: Self-pay | Admitting: *Deleted

## 2017-07-17 NOTE — Telephone Encounter (Signed)
Patient calling to request an FL-2 form be completed for special assistance in-home program.  Will route request to Dr. Jennette KettleNeal and Sammuel Hineseborah Moore, LCSW.  Altamese Dilling~Suheyla Mortellaro, BSN, RN-BC

## 2017-07-20 NOTE — Telephone Encounter (Signed)
Pt called nurse line requesting FL2 form. I informed patient it has been routed to appropriate providers at this time. Pt stated she would like to pick this up no later than 7/5.

## 2017-07-21 ENCOUNTER — Encounter: Payer: Self-pay | Admitting: Family Medicine

## 2017-07-21 NOTE — Progress Notes (Signed)
Type of Service: Clinical Social Work Consult  LCSW met with PCP to see how to support patient's request for an FL2.   LCSW called patient to obtain additional information about her requst.  Left voice message to call LCSW.  LCSW received return call from patient.  She is currently applying for the Special Assistance In-Home program at [place] and needs the [facility] in order to complete the application process. Patient would like to pick of form July 5th or 6th. Informed patient she would receive a call when the [facility] is ready.   Update provided to PCP  [name], LCSW Licensed Clinical Social Worker [facility]   [phone] 12:09 PM

## 2017-07-21 NOTE — Telephone Encounter (Signed)
Dear Cliffton AstersWhite Team Please let her know [facility] to be [facility] up [facility]! Denny LevySara Neda Willenbring

## 2017-07-22 NOTE — Telephone Encounter (Signed)
Pt informed. Sharon T Saunders, CMA  

## 2017-08-14 ENCOUNTER — Ambulatory Visit (INDEPENDENT_AMBULATORY_CARE_PROVIDER_SITE_OTHER): Payer: Medicare Other | Admitting: Pharmacist

## 2017-08-14 DIAGNOSIS — I482 Chronic atrial fibrillation, unspecified: Secondary | ICD-10-CM

## 2017-08-14 DIAGNOSIS — Z5181 Encounter for therapeutic drug level monitoring: Secondary | ICD-10-CM | POA: Diagnosis not present

## 2017-08-14 DIAGNOSIS — Z952 Presence of prosthetic heart valve: Secondary | ICD-10-CM

## 2017-08-14 LAB — POCT INR: INR: 2.4 (ref 2.0–3.0)

## 2017-08-14 NOTE — Patient Instructions (Signed)
Description   Take 1 tablet tomorrow, then continue taking same dose of Coumadin 1/2 tablet every day except 1 tablet on Mondays and Fridays. Repeat INR in 5 weeks.  Call if placed on any new medications or if scheduled for any procedures [phone] Call when colonoscopy is scheduled

## 2017-08-16 ENCOUNTER — Other Ambulatory Visit: Payer: Self-pay | Admitting: Family Medicine

## 2017-08-17 ENCOUNTER — Ambulatory Visit
Admission: RE | Admit: 2017-08-17 | Discharge: 2017-08-17 | Disposition: A | Discharge status: Home / Self Care | Payer: Medicare Other | Source: Ambulatory Visit | Attending: Family Medicine | Admitting: Family Medicine

## 2017-08-17 DIAGNOSIS — Z1231 Encounter for screening mammogram for malignant neoplasm of breast: Secondary | ICD-10-CM | POA: Diagnosis not present

## 2017-09-15 DIAGNOSIS — H401133 Primary open-angle glaucoma, bilateral, severe stage: Secondary | ICD-10-CM | POA: Diagnosis not present

## 2017-09-15 DIAGNOSIS — H0015 Chalazion left lower eyelid: Secondary | ICD-10-CM | POA: Diagnosis not present

## 2017-09-23 ENCOUNTER — Other Ambulatory Visit: Payer: Self-pay | Admitting: Family Medicine

## 2017-09-23 ENCOUNTER — Other Ambulatory Visit: Payer: Self-pay | Admitting: Internal Medicine

## 2017-09-25 ENCOUNTER — Ambulatory Visit (INDEPENDENT_AMBULATORY_CARE_PROVIDER_SITE_OTHER): Payer: Medicare Other | Admitting: *Deleted

## 2017-09-25 DIAGNOSIS — I482 Chronic atrial fibrillation, unspecified: Secondary | ICD-10-CM

## 2017-09-25 DIAGNOSIS — Z5181 Encounter for therapeutic drug level monitoring: Secondary | ICD-10-CM | POA: Diagnosis not present

## 2017-09-25 DIAGNOSIS — Z952 Presence of prosthetic heart valve: Secondary | ICD-10-CM

## 2017-09-25 LAB — POCT INR: INR: 1.8 — AB (ref 2.0–3.0)

## 2017-09-25 NOTE — Patient Instructions (Signed)
Description   Today take 1.5 tablets, tomorrow take 1 tablet , then change your dose to 1/2 tablet every day except 1 tablet on Mondays, Wednesdays and Fridays. Repeat INR in 2 weeks.  Call if placed on any new medications or if scheduled for any procedures [phone] Call when colonoscopy is scheduled

## 2017-10-08 ENCOUNTER — Other Ambulatory Visit: Payer: Self-pay | Admitting: Family Medicine

## 2017-10-09 ENCOUNTER — Ambulatory Visit (INDEPENDENT_AMBULATORY_CARE_PROVIDER_SITE_OTHER): Payer: Medicare Other | Admitting: *Deleted

## 2017-10-09 DIAGNOSIS — I482 Chronic atrial fibrillation, unspecified: Secondary | ICD-10-CM

## 2017-10-09 DIAGNOSIS — Z952 Presence of prosthetic heart valve: Secondary | ICD-10-CM | POA: Diagnosis not present

## 2017-10-09 DIAGNOSIS — Z5181 Encounter for therapeutic drug level monitoring: Secondary | ICD-10-CM

## 2017-10-09 LAB — POCT INR: INR: 2.7 (ref 2.0–3.0)

## 2017-10-09 NOTE — Patient Instructions (Signed)
Description   Continue taking  1/2 tablet every day except 1 tablet on Mondays, Wednesdays and Fridays. Repeat INR in 2 weeks.  Call if placed on any new medications or if scheduled for any procedures [phone] Call when colonoscopy is scheduled

## 2017-10-23 ENCOUNTER — Ambulatory Visit (INDEPENDENT_AMBULATORY_CARE_PROVIDER_SITE_OTHER): Payer: Medicare Other | Admitting: *Deleted

## 2017-10-23 DIAGNOSIS — Z952 Presence of prosthetic heart valve: Secondary | ICD-10-CM

## 2017-10-23 DIAGNOSIS — I482 Chronic atrial fibrillation, unspecified: Secondary | ICD-10-CM | POA: Diagnosis not present

## 2017-10-23 DIAGNOSIS — Z5181 Encounter for therapeutic drug level monitoring: Secondary | ICD-10-CM

## 2017-10-23 LAB — POCT INR: INR: 3.7 — AB (ref 2.0–3.0)

## 2017-10-23 NOTE — Patient Instructions (Signed)
Description   Today take 1/2 tablet then continue taking 1/2 tablet every day except 1 tablet on Mondays, Wednesdays and Fridays. Repeat INR in 2 weeks.  Call if placed on any new medications or if scheduled for any procedures [phone] Call when colonoscopy is scheduled

## 2017-10-29 ENCOUNTER — Other Ambulatory Visit: Payer: Self-pay | Admitting: Pharmacist

## 2017-10-29 DIAGNOSIS — Z952 Presence of prosthetic heart valve: Secondary | ICD-10-CM

## 2017-10-29 NOTE — Telephone Encounter (Signed)
Pt's pharmacy is requesting a refill on amoxicillin for a dental procedure. Would Dr. Taylor like to refill this medication? Please address 

## 2017-11-07 ENCOUNTER — Other Ambulatory Visit: Payer: Self-pay | Admitting: Family Medicine

## 2017-11-11 ENCOUNTER — Ambulatory Visit (INDEPENDENT_AMBULATORY_CARE_PROVIDER_SITE_OTHER): Payer: Medicare Other | Admitting: *Deleted

## 2017-11-11 DIAGNOSIS — Z952 Presence of prosthetic heart valve: Secondary | ICD-10-CM | POA: Diagnosis not present

## 2017-11-11 DIAGNOSIS — Z5181 Encounter for therapeutic drug level monitoring: Secondary | ICD-10-CM

## 2017-11-11 DIAGNOSIS — I482 Chronic atrial fibrillation, unspecified: Secondary | ICD-10-CM | POA: Diagnosis not present

## 2017-11-11 LAB — POCT INR: INR: 3 (ref 2.0–3.0)

## 2017-11-11 NOTE — Patient Instructions (Signed)
Description   Continue taking 1/2 tablet every day except 1 tablet on Mondays, Wednesdays and Fridays. Repeat INR in 3 weeks.  Call if placed on any new medications or if scheduled for any procedures [phone] Call when colonoscopy is scheduled.

## 2017-11-12 ENCOUNTER — Other Ambulatory Visit: Payer: Self-pay | Admitting: *Deleted

## 2017-11-16 MED ORDER — PANTOPRAZOLE SODIUM 40 MG PO TBEC: 40.0000 mg | DELAYED_RELEASE_TABLET | Freq: Every day | ORAL | 0 refills | Status: DC

## 2017-11-25 ENCOUNTER — Ambulatory Visit: Payer: Medicare Other | Admitting: Family Medicine

## 2017-12-07 ENCOUNTER — Ambulatory Visit (INDEPENDENT_AMBULATORY_CARE_PROVIDER_SITE_OTHER): Payer: Medicare Other | Admitting: Pharmacist

## 2017-12-07 DIAGNOSIS — Z5181 Encounter for therapeutic drug level monitoring: Secondary | ICD-10-CM | POA: Diagnosis not present

## 2017-12-07 DIAGNOSIS — Z952 Presence of prosthetic heart valve: Secondary | ICD-10-CM

## 2017-12-07 DIAGNOSIS — I482 Chronic atrial fibrillation, unspecified: Secondary | ICD-10-CM

## 2017-12-07 LAB — POCT INR: INR: 3.8 — AB (ref 2.0–3.0)

## 2017-12-07 NOTE — Patient Instructions (Signed)
Description   Hold dose tonight. Continue taking 1/2 tablet every day except 1 tablet on Mondays, Wednesdays and Fridays. Repeat INR in 3 weeks.  Call if placed on any new medications or if scheduled for any procedures [phone] Call when colonoscopy is scheduled.

## 2017-12-16 ENCOUNTER — Ambulatory Visit: Payer: Medicare Other | Admitting: Family Medicine

## 2017-12-31 ENCOUNTER — Ambulatory Visit (INDEPENDENT_AMBULATORY_CARE_PROVIDER_SITE_OTHER): Payer: Medicare Other | Admitting: *Deleted

## 2017-12-31 DIAGNOSIS — I482 Chronic atrial fibrillation, unspecified: Secondary | ICD-10-CM

## 2017-12-31 DIAGNOSIS — Z5181 Encounter for therapeutic drug level monitoring: Secondary | ICD-10-CM | POA: Diagnosis not present

## 2017-12-31 DIAGNOSIS — Z952 Presence of prosthetic heart valve: Secondary | ICD-10-CM | POA: Diagnosis not present

## 2017-12-31 LAB — POCT INR: INR: 3.1 — AB (ref 2.0–3.0)

## 2017-12-31 NOTE — Patient Instructions (Addendum)
Description   Continue taking 1/2 tablet every day except 1 tablet on Mondays, Wednesdays and Fridays. Repeat INR in 3-4 weeks.  Call if placed on any new medications or if scheduled for any procedures [phone] Call when colonoscopy is scheduled.

## 2018-01-02 ENCOUNTER — Other Ambulatory Visit: Payer: Self-pay | Admitting: Family Medicine

## 2018-01-06 ENCOUNTER — Other Ambulatory Visit: Payer: Self-pay

## 2018-01-06 ENCOUNTER — Encounter: Payer: Self-pay | Admitting: Family Medicine

## 2018-01-06 ENCOUNTER — Ambulatory Visit (INDEPENDENT_AMBULATORY_CARE_PROVIDER_SITE_OTHER): Payer: Medicare Other | Admitting: Family Medicine

## 2018-01-06 VITALS — BP 158/68 | HR 69 | Temp 97.9°F | Ht 68.0 in | Wt 165.0 lb

## 2018-01-06 DIAGNOSIS — I10 Essential (primary) hypertension: Secondary | ICD-10-CM | POA: Diagnosis not present

## 2018-01-06 DIAGNOSIS — I482 Chronic atrial fibrillation, unspecified: Secondary | ICD-10-CM

## 2018-01-06 DIAGNOSIS — N3941 Urge incontinence: Secondary | ICD-10-CM | POA: Diagnosis not present

## 2018-01-06 DIAGNOSIS — M1710 Unilateral primary osteoarthritis, unspecified knee: Secondary | ICD-10-CM | POA: Diagnosis not present

## 2018-01-06 DIAGNOSIS — IMO0002 Reserved for concepts with insufficient information to code with codable children: Secondary | ICD-10-CM

## 2018-01-06 DIAGNOSIS — Z952 Presence of prosthetic heart valve: Secondary | ICD-10-CM

## 2018-01-06 MED ORDER — TRAMADOL HCL 50 MG PO TABS: 50.0000 mg | ORAL_TABLET | Freq: Every evening | ORAL | 3 refills | Status: DC | PRN

## 2018-01-06 MED ORDER — HYDROCHLOROTHIAZIDE 25 MG PO TABS: ORAL_TABLET | ORAL | 3 refills | Status: DC

## 2018-01-06 NOTE — Patient Instructions (Addendum)
I have called in your refill on your blood pressure medicine.  Be sure to take that every day.  For your knee pain, I have called in some medicine( tramadol) to take 1 at nighttime before you go to bed.  It may make you sleepy.  I want to see if this will help your night knee pain.  Let me see you back for regular checkup in 2 or 3 months.  Have a happy holiday!   We at [facility], located in [place] and [place], [place], follow a "patient first" motto. Do you need help getting to the root of your foot or ankle problem? We're glad to help!Our podiatrist Ezequiel KayserMartha J. Ajlouny, DPM treats a variety of foot and ankle conditions including bunions, hammertoes, and diabetic foot, as well as offering custom molded orthotics.Call [phone] today! Same day appointments available!  Foot Conditions

## 2018-01-07 ENCOUNTER — Other Ambulatory Visit: Payer: Self-pay | Admitting: Internal Medicine

## 2018-01-07 NOTE — Progress Notes (Signed)
    CHIEF COMPLAINT / HPI: Planes of nighttime knee pain.  Keeping her awake.  She has daytime knee pain intermittently 4-5 out of 10.  Worse with long periods of sitting, climbing stairs.  At night, despite what she is done during the day, she will have aching knee pain and cannot find a comfortable position.  Has used OTC medications and creams without any benefit. 2.  Hypertension: She admits to occasionally missing her medication.  She has not yet taken it today.  She denies chest pain, no shortness of breath with exertion, no lower extremity edema.  No headaches. 3.  Valve replacement: She continues to do well.  She is faithful with her anticoagulation.  No episodes of bleeding.  No unusual bruising.  She gets her blood checked via the Coumadin clinic and is regular about that.  No blood in her urine or bleeding with toothbrushing. #4.  Urge incontinence: Continues on her medication.  It really helps.  No side effects.  No problems.  No burning on urination.  REVIEW OF SYSTEMS: See HPI.  Additional pertinent review of systems negative for fever, unusual weight change.  Mood is stable.  PERTINENT  PMH / PSH: I have reviewed the patient's medications, allergies, past medical and surgical history, smoking status and updated in the EMR as appropriate. History of Saint Jude mechanical valve replacement for mitral valve on chronic anticoagulation with Coumadin. History of tibial plateau fracture Hypertension with intermittent noncompliance with medication.  OBJECTIVE:  Vital signs reviewed. GENERAL: Well-developed, well-nourished, no acute distress. CARDIOVASCULAR: Regular rate and rhythm no murmur gallop or rub LUNGS: Clear to auscultation bilaterally, no rales or wheeze. ABDOMEN: Soft positive bowel sounds NEURO: No gross focal neurological deficits. MSK: Movement of extremity x 4.  Normal muscle bulk and tone.  Left knee has some external changes of osteoarthritis in her previous tibial  plateau fracture.  Neither knee has any effusion.  She has full flexion extension.  She has crepitus bilaterally left greater than right.  Calf is soft.  Popliteal  space is soft PSYCH: AxOx4. Good eye contact.. No psychomotor retardation or agitation. Appropriate speech fluency and content. Asks and answers questions appropriately. Mood is congruent.     ASSESSMENT / PLAN:  No problem-specific Assessment & Plan notes found for this encounter.

## 2018-01-08 DIAGNOSIS — N3941 Urge incontinence: Secondary | ICD-10-CM | POA: Insufficient documentation

## 2018-01-08 NOTE — Assessment & Plan Note (Signed)
Continues to follow with Coumadin clinic.  No problems.

## 2018-01-08 NOTE — Assessment & Plan Note (Signed)
Improved and stable on current medication 

## 2018-01-08 NOTE — Assessment & Plan Note (Signed)
A little hard to know how well she is controlled since she is not taken her blood pressure today.  She does have a wide pulse pressure so I do not think I would attempt to manage her medication dose at all today.  Given her age, I suspect goal blood pressure for her would be less than 160 systolic.

## 2018-01-08 NOTE — Assessment & Plan Note (Signed)
No episodes of RVR, no shortness of breath or chest pains.  Seems to be doing well.  Continues on anticoagulation.

## 2018-01-08 NOTE — Assessment & Plan Note (Signed)
She is having significant sleep disturbance from her knee pain.  Given her history of tibial plateau fracture, she is going to be at risk for increased arthritis on the left side.  We will start her at very low-dose tramadol and see her back in 4 to 6 weeks.

## 2018-01-25 ENCOUNTER — Ambulatory Visit (INDEPENDENT_AMBULATORY_CARE_PROVIDER_SITE_OTHER): Payer: Medicare Other | Admitting: *Deleted

## 2018-01-25 DIAGNOSIS — Z952 Presence of prosthetic heart valve: Secondary | ICD-10-CM

## 2018-01-25 DIAGNOSIS — I482 Chronic atrial fibrillation, unspecified: Secondary | ICD-10-CM | POA: Diagnosis not present

## 2018-01-25 DIAGNOSIS — Z5181 Encounter for therapeutic drug level monitoring: Secondary | ICD-10-CM | POA: Diagnosis not present

## 2018-01-25 LAB — POCT INR: INR: 2.7 (ref 2.0–3.0)

## 2018-01-25 NOTE — Patient Instructions (Signed)
Description   Continue taking 1/2 tablet every day except 1 tablet on Mondays, Wednesdays and Fridays. Repeat INR in 4 weeks.  Call if placed on any new medications or if scheduled for any procedures [phone] Call when colonoscopy is scheduled.

## 2018-02-01 DIAGNOSIS — H401133 Primary open-angle glaucoma, bilateral, severe stage: Secondary | ICD-10-CM | POA: Diagnosis not present

## 2018-02-22 ENCOUNTER — Ambulatory Visit (INDEPENDENT_AMBULATORY_CARE_PROVIDER_SITE_OTHER): Payer: Medicare Other

## 2018-02-22 DIAGNOSIS — Z952 Presence of prosthetic heart valve: Secondary | ICD-10-CM | POA: Diagnosis not present

## 2018-02-22 DIAGNOSIS — I482 Chronic atrial fibrillation, unspecified: Secondary | ICD-10-CM | POA: Diagnosis not present

## 2018-02-22 DIAGNOSIS — Z5181 Encounter for therapeutic drug level monitoring: Secondary | ICD-10-CM

## 2018-02-22 LAB — POCT INR: INR: 2.9 (ref 2.0–3.0)

## 2018-02-22 NOTE — Patient Instructions (Signed)
Description   Continue taking 1/2 tablet every day except 1 tablet on Mondays, Wednesdays and Fridays. Recheck in 4 weeks.  Call if placed on any new medications or if scheduled for any procedures [phone] Call when colonoscopy is scheduled.

## 2018-03-09 ENCOUNTER — Other Ambulatory Visit: Payer: Self-pay | Admitting: Internal Medicine

## 2018-03-09 DIAGNOSIS — Z952 Presence of prosthetic heart valve: Secondary | ICD-10-CM

## 2018-03-10 ENCOUNTER — Ambulatory Visit: Payer: Medicare Other | Admitting: Family Medicine

## 2018-03-17 DIAGNOSIS — R197 Diarrhea, unspecified: Secondary | ICD-10-CM | POA: Diagnosis not present

## 2018-03-23 ENCOUNTER — Other Ambulatory Visit: Payer: Self-pay | Admitting: Gastroenterology

## 2018-03-23 DIAGNOSIS — Z86010 Personal history of colonic polyps: Secondary | ICD-10-CM

## 2018-03-29 ENCOUNTER — Other Ambulatory Visit: Payer: Self-pay | Admitting: Internal Medicine

## 2018-03-30 ENCOUNTER — Other Ambulatory Visit: Payer: Self-pay | Admitting: Family Medicine

## 2018-03-31 ENCOUNTER — Ambulatory Visit (INDEPENDENT_AMBULATORY_CARE_PROVIDER_SITE_OTHER): Payer: Medicare Other | Admitting: *Deleted

## 2018-03-31 ENCOUNTER — Other Ambulatory Visit: Payer: Self-pay

## 2018-03-31 DIAGNOSIS — Z952 Presence of prosthetic heart valve: Secondary | ICD-10-CM

## 2018-03-31 DIAGNOSIS — Z5181 Encounter for therapeutic drug level monitoring: Secondary | ICD-10-CM

## 2018-03-31 DIAGNOSIS — I482 Chronic atrial fibrillation, unspecified: Secondary | ICD-10-CM | POA: Diagnosis not present

## 2018-03-31 LAB — POCT INR: INR: 2 (ref 2.0–3.0)

## 2018-03-31 NOTE — Patient Instructions (Signed)
Description   Today take 1.5 tablets then continue taking 1/2 tablet every day except 1 tablet on Mondays, Wednesdays and Fridays. Recheck in 3 weeks.  Call if placed on any new medications or if scheduled for any procedures [phone] Call when colonoscopy is scheduled.

## 2018-04-02 ENCOUNTER — Other Ambulatory Visit: Payer: Medicare Other

## 2018-04-09 ENCOUNTER — Ambulatory Visit
Admission: RE | Admit: 2018-04-09 | Discharge: 2018-04-09 | Disposition: A | Discharge status: Home / Self Care | Payer: Medicare Other | Source: Ambulatory Visit | Attending: Gastroenterology | Admitting: Gastroenterology

## 2018-04-09 ENCOUNTER — Other Ambulatory Visit: Payer: Self-pay

## 2018-04-09 DIAGNOSIS — Z86010 Personal history of colonic polyps: Secondary | ICD-10-CM | POA: Diagnosis not present

## 2018-04-20 ENCOUNTER — Telehealth: Payer: Self-pay | Admitting: Pharmacist

## 2018-04-20 NOTE — Telephone Encounter (Signed)
1. Do you currently have a fever? no (yes = cancel and refer to pcp for e-visit) 2. Have you recently travelled on a cruise, internationally, or to [place], [place], [place], [place], [place], or [place], [place] [employer]) ? no (yes = cancel, stay home, monitor symptoms, and contact pcp or initiate e-visit if symptoms develop) 3. Have you been in contact with someone that is currently pending confirmation of Covid19 testing or has been confirmed to have the Covid19 virus?  no (yes = cancel, stay home, away from tested individual, monitor symptoms, and contact pcp or initiate e-visit if symptoms develop) 4. Are you currently experiencing fatigue or cough? no (yes = pt should be prepared to have a mask placed at the time of their visit).  Pt. Advised that we are restricting visitors at this time and anyone present in the vehicle should meet the above criteria as well. Advised that visit will be at curbside for finger stick ONLY and will receive call with instructions. Pt also advised to please bring own pen for signature of arrival document.   Patient daughter will bring her. She has answered no to all questions

## 2018-04-20 NOTE — Telephone Encounter (Signed)
LMOM to COVID prescreen pt for INR check tomorrow and advise pt of drive up service.

## 2018-04-21 ENCOUNTER — Ambulatory Visit (INDEPENDENT_AMBULATORY_CARE_PROVIDER_SITE_OTHER): Payer: Medicare Other | Admitting: *Deleted

## 2018-04-21 ENCOUNTER — Other Ambulatory Visit: Payer: Self-pay

## 2018-04-21 DIAGNOSIS — I482 Chronic atrial fibrillation, unspecified: Secondary | ICD-10-CM

## 2018-04-21 DIAGNOSIS — Z5181 Encounter for therapeutic drug level monitoring: Secondary | ICD-10-CM

## 2018-04-21 DIAGNOSIS — Z952 Presence of prosthetic heart valve: Secondary | ICD-10-CM

## 2018-04-21 LAB — POCT INR: INR: 2.9 (ref 2.0–3.0)

## 2018-04-26 DIAGNOSIS — H2511 Age-related nuclear cataract, right eye: Secondary | ICD-10-CM | POA: Diagnosis not present

## 2018-04-26 DIAGNOSIS — H401133 Primary open-angle glaucoma, bilateral, severe stage: Secondary | ICD-10-CM | POA: Diagnosis not present

## 2018-05-20 ENCOUNTER — Telehealth: Payer: Self-pay | Admitting: Internal Medicine

## 2018-05-20 NOTE — Telephone Encounter (Signed)
New message   [facility] for pt to call back and schedule doxemity visit with Dr. Josie Dixon due recall. Will offer May 5th, need to speak to pt when returns call.

## 2018-05-21 NOTE — Telephone Encounter (Signed)
Follow up ° °Patient is returning your call. Please call the patient. °

## 2018-05-24 ENCOUNTER — Telehealth: Payer: Self-pay

## 2018-05-24 NOTE — Telephone Encounter (Signed)
lmom for prescreen  

## 2018-05-25 ENCOUNTER — Telehealth: Payer: Self-pay | Admitting: *Deleted

## 2018-05-25 ENCOUNTER — Other Ambulatory Visit: Payer: Self-pay | Admitting: Family Medicine

## 2018-05-25 NOTE — Telephone Encounter (Signed)
New message   Spoke w/pt and scheduled doxemity phone visit with Dr. Ladona Ridgelaylor on 05.12.20. Pt preferred number is listed in appt notes.      Virtual Visit Pre-Appointment Phone Call  "(Name), I am calling you today to discuss your upcoming appointment. We are currently trying to limit exposure to the virus that causes COVID-19 by seeing patients at home rather than in the office."  1. "What is the BEST phone number to call the day of the visit?" - include this in appointment notes  2. Do you have or have access to (through a family member/friend) a smartphone with video capability that we can use for your visit?" a. If yes - list this number in appt notes as cell (if different from BEST phone #) and list the appointment type as a VIDEO visit in appointment notes b. If no - list the appointment type as a PHONE visit in appointment notes  3. Confirm consent - "In the setting of the current Covid19 crisis, you are scheduled for a (phone or video) visit with your provider on (date) at (time).  Just as we do with many in-office visits, in order for you to participate in this visit, we must obtain consent.  If you'd like, I can send this to your mychart (if signed up) or email for you to review.  Otherwise, I can obtain your verbal consent now.  All virtual visits are billed to your insurance company just like a normal visit would be.  By agreeing to a virtual visit, we'd like you to understand that the technology does not allow for your provider to perform an examination, and thus may limit your provider's ability to fully assess your condition. If your provider identifies any concerns that need to be evaluated in person, we will make arrangements to do so.  Finally, though the technology is pretty good, we cannot assure that it will always work on either your or our end, and in the setting of a video visit, we may have to convert it to a phone-only visit.  In either situation, we cannot ensure that we  have a secure connection.  Are you willing to proceed?" STAFF: Did the patient verbally acknowledge consent to telehealth visit? Document YES/NO here: YES  4. Advise patient to be prepared - "Two hours prior to your appointment, go ahead and check your blood pressure, pulse, oxygen saturation, and your weight (if you have the equipment to check those) and write them all down. When your visit starts, your provider will ask you for this information. If you have an Apple Watch or Kardia device, please plan to have heart rate information ready on the day of your appointment. Please have a pen and paper handy nearby the day of the visit as well."  5. Give patient instructions for MyChart download to smartphone OR Doximity/Doxy.me as below if video visit (depending on what platform provider is using)  6. Inform patient they will receive a phone call 15 minutes prior to their appointment time (may be from unknown caller ID) so they should be prepared to answer    TELEPHONE CALL NOTE  Tami MatterSally M Ordway has been deemed a candidate for a follow-up tele-health visit to limit community exposure during the Covid-19 pandemic. I spoke with the patient via phone to ensure availability of phone/video source, confirm preferred email & phone number, and discuss instructions and expectations.  I reminded [name] to be prepared with any vital sign and/or heart rhythm  information that could potentially be obtained via home monitoring, at the time of her visit. I reminded MONZERATH PENDELL to expect a phone call prior to her visit.  [place] Toniann Fail 05/25/2018 10:03 AM   INSTRUCTIONS FOR DOWNLOADING THE MYCHART APP TO SMARTPHONE  - The patient must first make sure to have activated MyChart and know their login information - If Apple, go to [employer] and type in MyChart in the search bar and download the app. If Android, ask patient to go to [employer] and type in [place] in the search bar and download the app.  The app is free but as with any other app downloads, their phone may require them to verify saved payment information or Apple/Android password.  - The patient will need to then log into the app with their MyChart username and password, and select [place] as their healthcare provider to link the account. When it is time for your visit, go to the MyChart app, find appointments, and click Begin Video Visit. Be sure to Select Allow for your device to access the Microphone and Camera for your visit. You will then be connected, and your provider will be with you shortly.  **If they have any issues connecting, or need assistance please contact MyChart service desk [phone])**  **If using a computer, in order to ensure the best quality for their visit they will need to use either of the following Internet Browsers: [employer] or Google Chrome**  IF USING DOXIMITY or DOXY.ME - The patient will receive a link just prior to their visit by text.     FULL LENGTH CONSENT FOR TELE-HEALTH VISIT   I hereby voluntarily request, consent and authorize [facility] and its employed or contracted physicians, physician assistants, nurse practitioners or other licensed health care professionals (the Practitioner), to provide me with telemedicine health care services (the Services") as deemed necessary by the treating Practitioner. I acknowledge and consent to receive the Services by the Practitioner via telemedicine. I understand that the telemedicine visit will involve communicating with the Practitioner through live audiovisual communication technology and the disclosure of certain medical information by electronic transmission. I acknowledge that I have been given the opportunity to request an in-person assessment or other available alternative prior to the telemedicine visit and am voluntarily participating in the telemedicine visit.  I understand that I have the right to withhold or  withdraw my consent to the use of telemedicine in the course of my care at any time, without affecting my right to future care or treatment, and that the Practitioner or I may terminate the telemedicine visit at any time. I understand that I have the right to inspect all information obtained and/or recorded in the course of the telemedicine visit and may receive copies of available information for a reasonable fee.  I understand that some of the potential risks of receiving the Services via telemedicine include:   Delay or interruption in medical evaluation due to technological equipment failure or disruption;  Information transmitted may not be sufficient (e.g. poor resolution of images) to allow for appropriate medical decision making by the Practitioner; and/or   In rare instances, security protocols could fail, causing a breach of personal health information.  Furthermore, I acknowledge that it is my responsibility to provide information about my medical history, conditions and care that is complete and accurate to the best of my ability. I acknowledge that Practitioner's advice, recommendations, and/or decision may be based on factors  not within their control, such as incomplete or inaccurate data provided by me or distortions of diagnostic images or specimens that may result from electronic transmissions. I understand that the practice of medicine is not an exact science and that Practitioner makes no warranties or guarantees regarding treatment outcomes. I acknowledge that I will receive a copy of this consent concurrently upon execution via email to the email address I last provided but may also request a printed copy by calling the office of [place].    I understand that my insurance will be billed for this visit.   I have read or had this consent read to me.  I understand the contents of this consent, which adequately explains the benefits and risks of the Services being provided via  telemedicine.   I have been provided ample opportunity to ask questions regarding this consent and the Services and have had my questions answered to my satisfaction.  I give my informed consent for the services to be provided through the use of telemedicine in my medical care  By participating in this telemedicine visit I agree to the above.

## 2018-05-25 NOTE — Telephone Encounter (Signed)

## 2018-05-26 ENCOUNTER — Other Ambulatory Visit: Payer: Self-pay

## 2018-05-26 ENCOUNTER — Ambulatory Visit (INDEPENDENT_AMBULATORY_CARE_PROVIDER_SITE_OTHER): Payer: Medicare Other | Admitting: Pharmacist

## 2018-05-26 DIAGNOSIS — I482 Chronic atrial fibrillation, unspecified: Secondary | ICD-10-CM | POA: Diagnosis not present

## 2018-05-26 DIAGNOSIS — Z5181 Encounter for therapeutic drug level monitoring: Secondary | ICD-10-CM

## 2018-05-26 DIAGNOSIS — Z952 Presence of prosthetic heart valve: Secondary | ICD-10-CM

## 2018-05-26 LAB — POCT INR: INR: 3.7 — AB (ref 2.0–3.0)

## 2018-05-26 NOTE — Patient Instructions (Signed)
Description   Spoke with pt and instructed pt to take 1/2 tablet today then continue taking 1/2 tablet every day except 1 tablet on Mondays, Wednesdays and Fridays. Recheck in 4 weeks.  Call if placed on any new medications or if scheduled for any procedures [phone].

## 2018-05-28 ENCOUNTER — Other Ambulatory Visit: Payer: Self-pay | Admitting: Internal Medicine

## 2018-06-01 ENCOUNTER — Telehealth (INDEPENDENT_AMBULATORY_CARE_PROVIDER_SITE_OTHER): Payer: Medicare Other | Admitting: Internal Medicine

## 2018-06-01 ENCOUNTER — Other Ambulatory Visit: Payer: Self-pay

## 2018-06-01 DIAGNOSIS — I4729 Other ventricular tachycardia: Secondary | ICD-10-CM

## 2018-06-01 DIAGNOSIS — I482 Chronic atrial fibrillation, unspecified: Secondary | ICD-10-CM | POA: Diagnosis not present

## 2018-06-01 DIAGNOSIS — I472 Ventricular tachycardia: Secondary | ICD-10-CM | POA: Diagnosis not present

## 2018-06-01 MED ORDER — DIGOXIN 125 MCG PO TABS: 125.0000 ug | ORAL_TABLET | ORAL | 3 refills | Status: DC

## 2018-06-01 MED ORDER — DILTIAZEM HCL ER COATED BEADS 240 MG PO CP24: 240.0000 mg | ORAL_CAPSULE | Freq: Every day | ORAL | 3 refills | Status: DC

## 2018-06-01 MED ORDER — METOPROLOL TARTRATE 50 MG PO TABS: 50.0000 mg | ORAL_TABLET | Freq: Two times a day (BID) | ORAL | 3 refills | Status: DC

## 2018-06-01 MED ORDER — HYDROCHLOROTHIAZIDE 25 MG PO TABS: 25.0000 mg | ORAL_TABLET | Freq: Every day | ORAL | 3 refills | Status: DC

## 2018-06-01 NOTE — Progress Notes (Signed)
Electrophysiology TeleHealth Note   Due to national recommendations of social distancing due to COVID 19, an audio/video telehealth visit is felt to be most appropriate for this patient at this time.  See MyChart message from today for the patient's consent to telehealth for [facility].   Date:  06/01/2018   ID:  [name], DOB [birth year], MRN [id number]  Location: patient's home  Provider location: [address], [place]  Evaluation Performed: Follow-up visit  PCP:  Nestor RampNeal, Sara L, MD  Cardiologist:  No primary care provider on file. Ladona Ridgelaylor Electrophysiologist:  Dr Ladona Ridgelaylor  Chief Complaint:  "I been doing alright"  History of Present Illness:    [name] is a 83 y.o. female who presents via audio/video conferencing for a telehealth visit today. She is a pleasant 83 yo woman with mitral valve replacement, HTN and atrial fib. She has done well since last being seen in our clinic.  Today, she denies symptoms of palpitations, chest pain, shortness of breath,  lower extremity edema, dizziness, presyncope, or syncope.  The patient is otherwise without complaint today.  The patient denies symptoms of fevers, chills, cough, or new SOB worrisome for COVID 19.  She notes she was found to have a spot on her liver on CT scanning.   Past Medical History:  Diagnosis Date  . Asthma    years ago  . Chronic atrial fibrillation   . Dilated cardiomyopathy (HCC)    history of this, now resolved  . Dysphagia    Hx  . Fracture of tibial plateau 05/21/2015  . GERD (gastroesophageal reflux disease)   . GI bleed   . Hemorrhoid 04/2014   bleeding  . History of blood transfusion   . Hypertension   . Microcytic anemia   . Nonsustained ventricular tachycardia (HCC)   . Osteoporosis   . Protein in urine   . RBBB (right bundle branch block)   . Rheumatic mitral valve disease     Past Surgical History:  Procedure Laterality Date  . CHOLECYSTECTOMY    . COLONOSCOPY     . COLONOSCOPY W/ POLYPECTOMY    . MITOMYCIN C APPLICATION Right 05/17/2014   Procedure: MITOMYCIN C APPLICATION;  Surgeon: Chalmers Guestoy Whitaker, MD;  [facility];  Service: Ophthalmology;  Laterality: Right;  . MITRAL VALVE REPLACEMENT  1987   St Jude mechanical valve  . TRABECULECTOMY Right 05/17/2014   Procedure: TRABECULECTOMY WITH MITOMYCIN RIGHT EYE;  Surgeon: Chalmers Guestoy Whitaker, MD;  [facility];  Service: Ophthalmology;  Laterality: Right;  . TUBAL LIGATION    . US ECHOCARDIOGRAPHY  02/2008, 03/2006, 06/2004    Current Outpatient Medications  Medication Sig Dispense Refill  . Acetaminophen 500 MG coapsule Take 650 mg by mouth every 6 (six) hours as needed for fever.    Marland Kitchen. amoxicillin (AMOXIL) 500 MG tablet TAKE 4 TABLETS BY MOUTH AT ONCE 1 HOUR PRIOR TO DENTAL WORK 4 tablet 1  . Cholecalciferol (VITAMIN D) 2000 units tablet Take 1 tablet (2,000 Units total) by mouth daily. 100 tablet 3  . digoxin (LANOXIN) 0.125 MG tablet TAKE 1 TABLET BY MOUTH EVERY OTHER DAY 45 tablet 1  . diltiazem (CARDIZEM CD) 240 MG 24 hr capsule TAKE ONE CAPSULE BY MOUTH DAILY 90 capsule 3  . dorzolamidel-timolol (COSOPT) 22.3-6.8 MG/ML SOLN ophthalmic solution INT 1 GTT IN OU BID  4  . fluorometholone (FML) 0.1 % ophthalmic suspension Place 1 drop into both eyes daily.    .Marland Kitchen  hydrochlorothiazide (HYDRODIURIL) 25 MG tablet TAKE 1 TABLET(25 MG) BY MOUTH DAILY 90 tablet 3  . LUMIGAN 0.01 % SOLN INT 1 GTT INTO OU QHS  0  . metoprolol tartrate (LOPRESSOR) 50 MG tablet Take 1 tablet (50 mg total) by mouth 2 (two) times daily. 180 tablet 0  . mirabegron ER (MYRBETRIQ) 25 MG TB24 tablet Take 25 mg by mouth daily.    . pantoprazole (PROTONIX) 40 MG tablet TAKE 1 TABLET BY MOUTH DAILY 120 tablet 0  . polyethylene glycol (MIRALAX / GLYCOLAX) packet Take 17 g by mouth 2 (two) times daily. 14 each 0  . traMADol (ULTRAM) 50 MG tablet Take 1 tablet (50 mg total) by mouth at bedtime as needed. 30 tablet 3  . TRAVATAN Z 0.004 % SOLN  ophthalmic solution Place 1 drop into both eyes at bedtime.   12  . warfarin (COUMADIN) 10 MG tablet TAKE AS DIRECTED BY COUMADIN CLINIC 180 tablet 0   No current facility-administered medications for this visit.     Allergies:   Esomeprazole magnesium   Social History:  The patient  reports that she has quit smoking. She has a 22.50 pack-year smoking history. She has never used smokeless tobacco. She reports that she does not drink alcohol or use drugs.   Family History:  The patient's family history includes Blindness in her sister; Cancer in her father and sister; Endometriosis in her daughter; Hypertension in her daughter, daughter, daughter, mother, and sister; Liver disease in her daughter; Stroke in her mother and sister; Thyroid disease in her daughter.   ROS:  Please see the history of present illness.   All other systems are personally reviewed and negative.    Exam:    Vital Signs:  There were no vitals taken for this visit.    Labs/Other Tests and Data Reviewed:    Recent Labs: 06/10/2017: ALT 13; BUN 17; Creatinine, Ser 1.38; Potassium 3.5; Sodium 142   Wt Readings from Last 3 Encounters:  01/06/18 165 lb (74.8 kg)  06/10/17 177 lb (80.3 kg)  05/18/17 178 lb 12.8 oz (81.1 kg)     Other studies personally reviewed: Additional studies/ records that were reviewed today include:   none   ASSESSMENT & PLAN:    1.  Atrial fib - she is asymptomatic and her VR appears to be controlled. She does not have palpitaitons. 2. Mechanical mitral valve - she will continue coumadin. 3. HTN - her blood pressure is stable. 4. COVID 19 screen The patient denies symptoms of COVID 19 at this time.  The importance of social distancing was discussed today.  Follow-up:  12 months Next remote: n/a  Current medicines are reviewed at length with the patient today.   The patient does not have concerns regarding her medicines.  The following changes were made today:  none  Labs/ tests  ordered today include: none No orders of the defined types were placed in this encounter.    Patient Risk:  after full review of this patients clinical status, I feel that they are at moderate risk at this time.  Today, I have spent 15 minutes with the patient with telehealth technology discussing all of above.    Signed, Lewayne Bunting, MD  06/01/2018 9:54 AM     [facility] [address] [place] [address] [phone] (office) [phone] (fax)

## 2018-06-16 ENCOUNTER — Telehealth (INDEPENDENT_AMBULATORY_CARE_PROVIDER_SITE_OTHER): Payer: Medicare Other | Admitting: Family Medicine

## 2018-06-16 ENCOUNTER — Other Ambulatory Visit: Payer: Self-pay

## 2018-06-16 DIAGNOSIS — Z9049 Acquired absence of other specified parts of digestive tract: Secondary | ICD-10-CM | POA: Insufficient documentation

## 2018-06-16 DIAGNOSIS — N183 Chronic kidney disease, stage 3 unspecified: Secondary | ICD-10-CM

## 2018-06-16 DIAGNOSIS — R932 Abnormal findings on diagnostic imaging of liver and biliary tract: Secondary | ICD-10-CM

## 2018-06-16 DIAGNOSIS — I10 Essential (primary) hypertension: Secondary | ICD-10-CM | POA: Diagnosis not present

## 2018-06-16 NOTE — Progress Notes (Addendum)
[facility] Telemedicine Visit  Patient consented to have visit conducted via telephone.  Encounter participants: Patient: [name]  Provider: Denny Levy  Others (if applicable): none  Chief Complaint / HPI: #1.  Had her colonoscopy done by virtual /CT scan done.  The nurse at the GI office told her there was some type of spot on her liver and that the doctor would call her back but she has not yet heard back from them.  She wants to get some lab work done to check on her liver. #2.  Also wants to check on her kidneys as she knows we follow that on a regular basis #3.  Taking her blood pressure medicines regularly without problem.  She has not had any headache.  She does not think she needs refills right now is the cardiologist did some refills for her.  She is a little confused about who should do her refills.  ROS: No fever No productive cough.  No chest pain.  No unusual weight change No abdominal pain  OBJECTIVE observed via telephone device: PSYCH: AxOx4.  Appropriate speech fluency and content. Asks and answers questions appropriately. Mood is congruent. RESPIRATORY: no unusual sounds of labored breathing    Pertinent PMHx / PSHx:  I have reviewed the patient's medications, allergies, past medical and surgical history, smoking status and updated in the EMR as appropriate.   Virtual colonoscopy report from April 09, 2018:Status post right hemicolectomy with appendectomy. Mild layering fluid/contrast in the colon, which shifts position with supine/prone imaging, and does not constrain evaluation. Sigmoid colon is underdistended on prone imaging but adequately evaluated on supine imaging. No significant colonic polyp, mass, apple core lesion, or stricture. Mild sigmoid diverticulosis, without evidence of diverticulitis.Lower chest: Lung bases are clear.  Cardiomegaly. Hepatobiliary: Cirrhosis. Status post cholecystectomy. No intrahepatic or  extrahepatic ductal dilatation. CT scan February 13, 2006: Report: Moderate ascites and postoperative changes but no sign of focal abscess noted in her abdominal CT.  Right hemicolectomy noted.  Few sigmoid diverticuli without evidence of diverticulitis.  Assessment/Plan:  Abnormal CT scan, liver I discussed this with her.  I do not think is clinically significant.  I will send a note to her gastroenterologist so perhaps he can call her and relate her fears.  I will also get the blood work she asked for  HYPERTENSION, BENIGN SYSTEMIC Seems to be doing well.  We will check some blood work.  Continue current medication regimen  CKD (chronic kidney disease), stage III Check labs today    Time spent on phone with patient: 21 minutes

## 2018-06-16 NOTE — Assessment & Plan Note (Signed)
Seems to be doing well.  We will check some blood work.  Continue current medication regimen

## 2018-06-16 NOTE — Assessment & Plan Note (Signed)
I discussed this with her.  I do not think is clinically significant.  I will send a note to her gastroenterologist so perhaps he can call her and relate her fears.  I will also get the blood work she asked for

## 2018-06-16 NOTE — Assessment & Plan Note (Signed)
Check labs today.

## 2018-06-17 ENCOUNTER — Telehealth: Payer: Self-pay

## 2018-06-17 NOTE — Telephone Encounter (Signed)
lmom for prescreen  

## 2018-06-17 NOTE — Telephone Encounter (Signed)

## 2018-06-18 ENCOUNTER — Ambulatory Visit (INDEPENDENT_AMBULATORY_CARE_PROVIDER_SITE_OTHER): Payer: Medicare Other | Admitting: *Deleted

## 2018-06-18 ENCOUNTER — Other Ambulatory Visit: Payer: Self-pay

## 2018-06-18 DIAGNOSIS — Z5181 Encounter for therapeutic drug level monitoring: Secondary | ICD-10-CM

## 2018-06-18 DIAGNOSIS — I482 Chronic atrial fibrillation, unspecified: Secondary | ICD-10-CM | POA: Diagnosis not present

## 2018-06-18 DIAGNOSIS — Z952 Presence of prosthetic heart valve: Secondary | ICD-10-CM

## 2018-06-18 LAB — POCT INR: INR: 3.6 — AB (ref 2.0–3.0)

## 2018-06-27 ENCOUNTER — Other Ambulatory Visit: Payer: Self-pay | Admitting: Internal Medicine

## 2018-07-05 ENCOUNTER — Telehealth: Payer: Self-pay

## 2018-07-05 NOTE — Telephone Encounter (Signed)
lmom for prescreen  

## 2018-07-06 NOTE — Telephone Encounter (Signed)

## 2018-07-12 ENCOUNTER — Ambulatory Visit (INDEPENDENT_AMBULATORY_CARE_PROVIDER_SITE_OTHER): Payer: Medicare Other

## 2018-07-12 ENCOUNTER — Other Ambulatory Visit: Payer: Self-pay

## 2018-07-12 DIAGNOSIS — Z5181 Encounter for therapeutic drug level monitoring: Secondary | ICD-10-CM | POA: Diagnosis not present

## 2018-07-12 DIAGNOSIS — I482 Chronic atrial fibrillation, unspecified: Secondary | ICD-10-CM | POA: Diagnosis not present

## 2018-07-12 DIAGNOSIS — Z952 Presence of prosthetic heart valve: Secondary | ICD-10-CM

## 2018-07-12 LAB — POCT INR: INR: 3.5 — AB (ref 2.0–3.0)

## 2018-07-12 NOTE — Patient Instructions (Signed)
Description   Continue on same dosage 1/2 tablet every day except 1 tablet on Mondays and Wednesdays. Recheck in 4 weeks.  Call if placed on any new medications or if scheduled for any procedures [phone].

## 2018-07-13 ENCOUNTER — Other Ambulatory Visit: Payer: Self-pay | Admitting: Family Medicine

## 2018-07-13 DIAGNOSIS — Z1231 Encounter for screening mammogram for malignant neoplasm of breast: Secondary | ICD-10-CM

## 2018-07-26 DIAGNOSIS — H401133 Primary open-angle glaucoma, bilateral, severe stage: Secondary | ICD-10-CM | POA: Diagnosis not present

## 2018-07-26 DIAGNOSIS — H2511 Age-related nuclear cataract, right eye: Secondary | ICD-10-CM | POA: Diagnosis not present

## 2018-08-03 ENCOUNTER — Telehealth: Payer: Self-pay

## 2018-08-03 NOTE — Telephone Encounter (Signed)

## 2018-08-09 ENCOUNTER — Ambulatory Visit (INDEPENDENT_AMBULATORY_CARE_PROVIDER_SITE_OTHER): Payer: Medicare Other | Admitting: *Deleted

## 2018-08-09 ENCOUNTER — Other Ambulatory Visit: Payer: Self-pay

## 2018-08-09 DIAGNOSIS — I482 Chronic atrial fibrillation, unspecified: Secondary | ICD-10-CM | POA: Diagnosis not present

## 2018-08-09 DIAGNOSIS — Z5181 Encounter for therapeutic drug level monitoring: Secondary | ICD-10-CM | POA: Diagnosis not present

## 2018-08-09 DIAGNOSIS — Z952 Presence of prosthetic heart valve: Secondary | ICD-10-CM

## 2018-08-09 LAB — POCT INR: INR: 2.9 (ref 2.0–3.0)

## 2018-08-09 NOTE — Patient Instructions (Addendum)
Description   Continue on same dosage 1/2 tablet every day except 1 tablet on Mondays and Wednesdays. Recheck in 5 weeks.  Call if placed on any new medications or if scheduled for any procedures [phone].

## 2018-08-10 ENCOUNTER — Ambulatory Visit (INDEPENDENT_AMBULATORY_CARE_PROVIDER_SITE_OTHER): Payer: Medicare Other | Admitting: Licensed Clinical Social Worker

## 2018-08-10 DIAGNOSIS — Z789 Other specified health status: Secondary | ICD-10-CM

## 2018-08-10 NOTE — BH Specialist Note (Signed)
    Consult Note  08/10/2018 Name: [name] MRN: [id number] DOB: Jun 10, 1935  Referred by: Dickie La, MD Reason for referral : Care Coordination Received voice mail from patient requesting assistance with getting FL2 for DSS worker for in-home services program.  [name] is a 83 y.o. year old female who sees Dickie La, MD for primary care.  LCSW returned call to assess for needs and barriers. Per patient worker has not been able to get FL2. Patient states she would like PCP to complete FL2 and fax it to her medicaid worker at [place]. [name] [phone] phone  And [phone] Fax).    LCSW call worker to verified needed information to share with provider.  Left voice message to call LCSW.    Follow Up Plan:  1. LSCw will share information with PCP to see if she has received the FL2 2. LCSW will wait for return call from [place] worker, if no return call in 2 days will call again 3. LCSW will provide patient with an update.  Dr. Nori Riis has been notified of this request and plan.  [name], [facility]   [phone] 9:19 AM

## 2018-08-12 ENCOUNTER — Telehealth: Payer: Self-pay | Admitting: Licensed Clinical Social Worker

## 2018-08-12 NOTE — Telephone Encounter (Signed)
   Social Work Note  08/12/2018 Name: DWANA GARIN MRN: [id number] DOB: 1935/10/19  SHANEECE STOCKBURGER is a 83 y.o. year old female who sees Dickie La, MD for primary care.   LCSW received voice message from DSS worker Ms Junius Finner [phone].  Patient is in a the [place] which requires a yearly Fl2 completed my medical provider.  She left instructions on what is needed on the [facility] and requesting to have provider fax it to [phone]. Patient has also requested to have the [facility] faxed to the [facility] worker so that she will not loose her benefits.  LCSW completed demographic of FL2 and placed in PCP mailbox to complete.  Plan: PCP will fax FL2 once it is completed and signed.  Dr. Nori Riis has been notified of this request and plan.  [name], [facility]   [phone] 10:22 AM

## 2018-08-24 ENCOUNTER — Ambulatory Visit: Payer: Medicare Other

## 2018-08-26 ENCOUNTER — Other Ambulatory Visit: Payer: Self-pay | Admitting: Family Medicine

## 2018-08-31 ENCOUNTER — Encounter: Payer: Self-pay | Admitting: Family Medicine

## 2018-09-02 NOTE — Telephone Encounter (Signed)
Care Coordination Social Work Note  09/02/2018 Name: [name] MRN: [id number] DOB: Jun 10, 1935  FL2 completed by PCP.  FL2 placed at front office to fax to [place] for patient to continue participating the Special Assistance Program for in-home supplemental support.  [name], LCSW Clinical Social Worker [place] [facility] [place]   [phone] 8:12 AM

## 2018-09-10 DIAGNOSIS — K7460 Unspecified cirrhosis of liver: Secondary | ICD-10-CM | POA: Diagnosis not present

## 2018-09-13 ENCOUNTER — Encounter (INDEPENDENT_AMBULATORY_CARE_PROVIDER_SITE_OTHER): Payer: Self-pay

## 2018-09-13 ENCOUNTER — Ambulatory Visit (INDEPENDENT_AMBULATORY_CARE_PROVIDER_SITE_OTHER): Payer: Medicare Other | Admitting: Pharmacist

## 2018-09-13 ENCOUNTER — Other Ambulatory Visit: Payer: Self-pay

## 2018-09-13 DIAGNOSIS — Z952 Presence of prosthetic heart valve: Secondary | ICD-10-CM | POA: Diagnosis not present

## 2018-09-13 DIAGNOSIS — I482 Chronic atrial fibrillation, unspecified: Secondary | ICD-10-CM

## 2018-09-13 DIAGNOSIS — Z5181 Encounter for therapeutic drug level monitoring: Secondary | ICD-10-CM | POA: Diagnosis not present

## 2018-09-13 LAB — POCT INR: INR: 3.9 — AB (ref 2.0–3.0)

## 2018-09-13 NOTE — Patient Instructions (Signed)
Description   Skip your Coumadin today, then continue on same dosage 1/2 tablet every day except 1 tablet on Mondays and Wednesdays. Recheck in 3 weeks.  Call if placed on any new medications or if scheduled for any procedures [phone].

## 2018-09-21 ENCOUNTER — Telehealth: Payer: Self-pay | Admitting: *Deleted

## 2018-09-21 NOTE — Telephone Encounter (Signed)
Patient with diagnosis of ATRIAL FIBRILLATION AND ST JUDE MITRAL VALVE on WARFARIN for anticoagulation.   Procedure: ENDOSCOPY Date of procedure: OCTOBER/27/2020  CrCl = 37 ML/MIN  Per office protocol, patient can hold WARFARIN for 5 days prior to procedure.   Patient WILL need bridging with Lovenox (enoxaparin) around procedure.  * Need recent CBC and BMET as well* Will alert Coumadin clinic at [facility] to coordinate bridging*

## 2018-09-21 NOTE — Telephone Encounter (Signed)
Left message on voicemail for patient to call back and discuss her current status and clearance for the procedure. Also patient will need to be arranged to have labs, CBC and basic metabolic panel.

## 2018-09-21 NOTE — Telephone Encounter (Signed)
   [facility] Pre-operative Risk Assessment    Request for surgical clearance:  1. What type of surgery is being performed? ENDOSCOPY   2. When is this surgery scheduled? 11/16/18   3. What type of clearance is required (medical clearance vs. Pharmacy clearance to hold med vs. Both)? BOTH  4. Are there any medications that need to be held prior to surgery and how long? COUMADIN X 5 DAYS PRIOR   5. Practice name and name of physician performing surgery? [facility]; DR. Paulita Fujita   6. What is your office phone number [phone]    7.   What is your office fax number [phone]  8.   Anesthesia type (None, local, MAC, general) ? NONE LISTED. PROPOFOL ?    Julaine Hua 09/21/2018, 1:10 PM  _________________________________________________________________   (provider comments below)

## 2018-09-23 NOTE — Telephone Encounter (Signed)
   Primary Cardiologist: Cristopher Peru, MD  Chart reviewed as part of pre-operative protocol coverage. Patient was contacted 09/23/2018 in reference to pre-operative risk assessment for pending surgery as outlined below.  [name] was last seen on 06/01/18 by [name].  Since that day, [name] has done well. No chest pain, shortness of breath or palpitations. She has hx of mechanical mitral valve replacement and chronic atrial fibrillation, on warfarin.  Therefore, based on ACC/AHA guidelines, the patient would be at acceptable risk for the planned procedure without further cardiovascular testing.   According to our pharmacy protocol: Patient with diagnosis of ATRIAL FIBRILLATION AND [name] on WARFARIN for anticoagulation.   Procedure: ENDOSCOPY Date of procedure: OCTOBER/27/2020  CrCl = 37 ML/MIN  Per office protocol, patient can hold WARFARIN for 5 days prior to procedure.   Patient WILL need bridging with Lovenox (enoxaparin) around procedure.  ** Need recent CBC and BMET as well* Pt wants to have this done at her PCP appt on 9/17. She has coumadin clinic appt on 9/14 and will leave this up to them. I told pt that she may as well have labs done on that day since she is there.   I will route this recommendation to the requesting party via Epic fax function and remove from pre-op pool.  Please call with questions.  Daune Perch, NP 09/23/2018, 11:22 AM

## 2018-10-04 ENCOUNTER — Other Ambulatory Visit: Payer: Self-pay

## 2018-10-04 ENCOUNTER — Ambulatory Visit (INDEPENDENT_AMBULATORY_CARE_PROVIDER_SITE_OTHER): Payer: Medicare Other | Admitting: Pharmacist

## 2018-10-04 DIAGNOSIS — Z952 Presence of prosthetic heart valve: Secondary | ICD-10-CM

## 2018-10-04 DIAGNOSIS — I482 Chronic atrial fibrillation, unspecified: Secondary | ICD-10-CM | POA: Diagnosis not present

## 2018-10-04 DIAGNOSIS — Z5181 Encounter for therapeutic drug level monitoring: Secondary | ICD-10-CM

## 2018-10-04 LAB — CBC
Hematocrit: 37.2 % (ref 34.0–46.6)
Hemoglobin: 12.1 g/dL (ref 11.1–15.9)
MCH: 27.3 pg (ref 26.6–33.0)
MCHC: 32.5 g/dL (ref 31.5–35.7)
MCV: 84 fL (ref 79–97)
Platelets: 224 10*3/uL (ref 150–450)
RBC: 4.43 x10E6/uL (ref 3.77–5.28)
RDW: 13.7 % (ref 11.7–15.4)
WBC: 5.8 10*3/uL (ref 3.4–10.8)

## 2018-10-04 LAB — BASIC METABOLIC PANEL
BUN/Creatinine Ratio: 14 (ref 12–28)
BUN: 23 mg/dL (ref 8–27)
CO2: 23 mmol/L (ref 20–29)
Calcium: 9.8 mg/dL (ref 8.7–10.3)
Chloride: 101 mmol/L (ref 96–106)
Creatinine, Ser: 1.63 mg/dL — ABNORMAL HIGH (ref 0.57–1.00)
GFR calc Af Amer: 34 mL/min/{1.73_m2} — ABNORMAL LOW (ref >59)
GFR calc non Af Amer: 29 mL/min/{1.73_m2} — ABNORMAL LOW (ref >59)
Glucose: 94 mg/dL (ref 65–99)
Potassium: 3.5 mmol/L (ref 3.5–5.2)
Sodium: 142 mmol/L (ref 134–144)

## 2018-10-04 LAB — POCT INR: INR: 3.3 — AB (ref 2.0–3.0)

## 2018-10-04 NOTE — Patient Instructions (Addendum)
Description   Continue on same dosage 1/2 tablet every day except 1 tablet on Mondays and Wednesdays. Recheck in 4-5 weeks.  Call if placed on any new medications or if scheduled for any procedures [phone].

## 2018-10-07 ENCOUNTER — Other Ambulatory Visit: Payer: Self-pay

## 2018-10-07 ENCOUNTER — Ambulatory Visit (INDEPENDENT_AMBULATORY_CARE_PROVIDER_SITE_OTHER): Payer: Medicare Other

## 2018-10-07 VITALS — BP 155/75 | HR 64 | Ht 68.0 in | Wt 164.0 lb

## 2018-10-07 DIAGNOSIS — Z0000 Encounter for general adult medical examination without abnormal findings: Secondary | ICD-10-CM | POA: Diagnosis not present

## 2018-10-07 NOTE — Progress Notes (Signed)
Subjective:   [name] is a 83 y.o. female who presents for Medicare Annual (Subsequent) preventive examination.  The patient consented to a virtual visit.  Review of Systems: Defer to PCP   Cardiac Risk Factors include: advanced age (>62men, >20 women);hypertension  Objective:   Vitals: BP (!) 155/75   Pulse 64   Ht 5\' 8"  (1.727 m)   Wt 164 lb (74.4 kg)   SpO2 97%   BMI 24.94 kg/m   Body mass index is 24.94 kg/m.  Advanced Directives 10/07/2018 09/17/2016 06/25/2016 05/06/2016 10/24/2015 08/13/2015 07/18/2015  Does Patient Have a Medical Advance Directive? No No No No No Yes No  Would patient like information on creating a medical advance directive? Yes (MAU/Ambulatory/Procedural Areas - Information given) No - Patient declined No - Patient declined Yes (MAU/Ambulatory/Procedural Areas - Information given) No - patient declined information - No - patient declined information    Tobacco Social History   Tobacco Use  Smoking Status Former Smoker  . Packs/day: 1.50  . Years: 15.00  . Pack years: 22.50  Smokeless Tobacco Never Used  Tobacco Comment   quit 1987      Clinical Intake:  Pre-visit preparation completed: Yes  Pain Score: 0-No pain  How often do you need to have someone help you when you read instructions, pamphlets, or other written materials from your doctor or pharmacy?: 2 - Rarely What is the last grade level you completed in school?: high school  Interpreter Needed?: No  Past Medical History:  Diagnosis Date  . Asthma    years ago  . Chronic atrial fibrillation   . Dilated cardiomyopathy (HCC)    history of this, now resolved  . Dysphagia    Hx  . Fracture of tibial plateau 05/21/2015  . GERD (gastroesophageal reflux disease)   . GI bleed   . Hemorrhoid 04/2014   bleeding  . History of blood transfusion   . Hypertension   . Microcytic anemia   . Nonsustained ventricular tachycardia (HCC)   . Osteoporosis   . Protein in urine   . RBBB  (right bundle branch block)   . Rheumatic mitral valve disease    Past Surgical History:  Procedure Laterality Date  . CHOLECYSTECTOMY    . COLONOSCOPY    . COLONOSCOPY W/ POLYPECTOMY    . MITOMYCIN C APPLICATION Right 05/17/2014   Procedure: MITOMYCIN C APPLICATION;  Surgeon: Chalmers Guest, MD;  [facility] OR;  Service: Ophthalmology;  Laterality: Right;  . MITRAL VALVE REPLACEMENT  1987   St Jude mechanical valve  . TRABECULECTOMY Right 05/17/2014   Procedure: TRABECULECTOMY WITH MITOMYCIN RIGHT EYE;  Surgeon: Chalmers Guest, MD;  Location: [facility] OR;  Service: Ophthalmology;  Laterality: Right;  . TUBAL LIGATION    . US ECHOCARDIOGRAPHY  02/2008, 03/2006, 06/2004   Family History  Problem Relation Age of Onset  . Cancer Father   . Hypertension Mother   . Stroke Mother   . Cancer Sister        in the Bladder  . Endometriosis Daughter   . Stroke Sister   . Blindness Sister   . Hypertension Sister   . Thyroid disease Daughter   . Hypertension Daughter   . Hypertension Daughter   . Hypertension Daughter   . Liver disease Daughter   . Colon cancer Neg Hx   . Breast cancer Neg Hx    Social History   Socioeconomic History  . Marital status: Divorced    Spouse name:  Not on file  . Number of children: 5  . Years of education: 53  . Highest education level: High school graduate  Occupational History  . Occupation: Retired  Scientific laboratory technician  . Financial resource strain: Not hard at all  . Food insecurity    Worry: Never true    Inability: Never true  . Transportation needs    Medical: No    Non-medical: No  Tobacco Use  . Smoking status: Former Smoker    Packs/day: 1.50    Years: 15.00    Pack years: 22.50  . Smokeless tobacco: Never Used  . Tobacco comment: quit 1987  Substance and Sexual Activity  . Alcohol use: No  . Drug use: No  . Sexual activity: Not Currently  Lifestyle  . Physical activity    Days per week: 3 days    Minutes per session: 30 min  . Stress: Not at  all  Relationships  . Social Herbalist on phone: Three times a week    Gets together: Three times a week    Attends religious service: 1 to 4 times per year    Active member of club or organization: No    Attends meetings of clubs or organizations: Never    Relationship status: Divorced  Other Topics Concern  . Not on file  Social History Narrative   Patient lives alone in [place].   Patient has 3 children who live near her and offer support. 2 live up [place].   Patient is active in [place] and enjoys walking for exercise.     Outpatient Encounter Medications as of 10/07/2018  Medication Sig  . amoxicillin (AMOXIL) 500 MG tablet TAKE 4 TABLETS BY MOUTH AT ONCE 1 HOUR PRIOR TO DENTAL WORK  . Cholecalciferol (VITAMIN D) 2000 units tablet Take 1 tablet (2,000 Units total) by mouth daily.  . digoxin (LANOXIN) 0.125 MG tablet Take 1 tablet (125 mcg total) by mouth every other day.  . diltiazem (CARDIZEM CD) 240 MG 24 hr capsule Take 1 capsule (240 mg total) by mouth daily.  . dorzolamidel-timolol (COSOPT) 22.3-6.8 MG/ML SOLN ophthalmic solution INT 1 GTT IN OU BID  . hydrochlorothiazide (HYDRODIURIL) 25 MG tablet Take 1 tablet (25 mg total) by mouth daily.  Marland Kitchen LUMIGAN 0.01 % SOLN INT 1 GTT INTO OU QHS  . metoprolol tartrate (LOPRESSOR) 50 MG tablet Take 1 tablet (50 mg total) by mouth 2 (two) times daily.  . mirabegron ER (MYRBETRIQ) 25 MG TB24 tablet Take 25 mg by mouth daily.  . pantoprazole (PROTONIX) 40 MG tablet TAKE 1 TABLET BY MOUTH DAILY  . polyethylene glycol (MIRALAX / GLYCOLAX) packet Take 17 g by mouth 2 (two) times daily.  . TRAVATAN Z 0.004 % SOLN ophthalmic solution Place 1 drop into both eyes at bedtime.   Marland Kitchen warfarin (COUMADIN) 10 MG tablet TAKE AS DIRECTED BY COUMADIN CLINIC  . Acetaminophen 500 MG coapsule Take 650 mg by mouth every 6 (six) hours as needed for fever.  . fluorometholone (FML) 0.1 % ophthalmic suspension Place 1 drop into both eyes daily.  .  traMADol (ULTRAM) 50 MG tablet Take 1 tablet (50 mg total) by mouth at bedtime as needed. (Patient not taking: Reported on 10/07/2018)   No facility-administered encounter medications on file as of 10/07/2018.     Activities of Daily Living In your present state of health, do you have any difficulty performing the following activities: 10/07/2018  Hearing? Y  Comment was not wearing  her hearing aides  Vision? N  Difficulty concentrating or making decisions? N  Walking or climbing stairs? N  Dressing or bathing? N  Doing errands, shopping? N  Preparing Food and eating ? N  Using the Toilet? N  In the past six months, have you accidently leaked urine? Y  Do you have problems with loss of bowel control? N  Managing your Medications? N  Managing your Finances? N  Housekeeping or managing your Housekeeping? N  Some recent data might be hidden   Patient Care Team: Nestor RampNeal, Sara L, MD as PCP - General (Family Medicine) Marinus Mawaylor, Gregg W, MD as PCP - Cardiology (Cardiology) Willis Modenautlaw, William, MD as Consulting Physician (Gastroenterology)    Assessment:   This is a routine wellness examination for Kennon RoundsSally.  Exercise Activities and Dietary recommendations Current Exercise Habits: Home exercise routine, Type of exercise: walking, Time (Minutes): 30, Frequency (Times/Week): 3, Weekly Exercise (Minutes/Week): 90, Intensity: Moderate, Exercise limited by: orthopedic condition(s)  Goals    . Blood Pressure < 140/90    . Exercise 4-5x a week (pt-stated)     Good job walking 3x per week 30 minutes a time.  Lets try to increase to 4-5x per week 30 minutes each time.        Fall Risk Fall Risk  10/07/2018 06/10/2017 09/17/2016 05/06/2016 07/11/2015  Falls in the past year? 0 No Yes Yes No  Number falls in past yr: - - 2 or more 2 or more -  Comment - - stated these were in 2017 - -  Injury with Fall? - - Yes Yes -  Comment - - broke bone in knee/wrist - -  Risk Factor Category  - - - High Fall Risk -   Risk for fall due to : - - - History of fall(s) -  Risk for fall due to: Comment - - - Tripped over lamp cord/fell in shower -  Follow up - - - Falls evaluation completed;Education provided;Falls prevention discussed -  Comment - - - PCP notified -   Is the patient's home free of loose throw rugs in walkways, pet beds, electrical cords, etc?   yes      Grab bars in the bathroom? yes      Handrails on the stairs?   no      Adequate lighting?   yes  Patient rating of health (0-10) scale: 7  Depression Screen PHQ 2/9 Scores 10/07/2018 06/10/2017 09/17/2016 06/25/2016  PHQ - 2 Score 0 0 0 0  PHQ- 9 Score - - - -     Cognitive Function   6CIT Screen 10/07/2018  What Year? 0 points  What month? 0 points  What time? 0 points  Count back from 20 0 points  Months in reverse 2 points  Repeat phrase 0 points  Total Score 2    Immunization History  Administered Date(s) Administered  . Influenza,inj,Quad PF,6+ Mos 10/25/2013, 10/24/2015  . Influenza-Unspecified 10/04/2018  . Pneumococcal Conjugate-13 05/06/2016  . Pneumococcal Polysaccharide-23 09/24/2002  . Td 07/20/2001    Screening Tests Health Maintenance  Topic Date Due  . TETANUS/TDAP  07/21/2011  . INFLUENZA VACCINE  08/21/2018  . DEXA SCAN  Completed  . PNA vac Low Risk Adult  Completed   Cancer Screenings: Lung: Low Dose CT Chest recommended if Age 44-80 years, 30 pack-year currently smoking OR have quit w/in 15years. Patient does not qualify. Breast:  Up to date on Mammogram? Yes   Up to date of Bone  Density/Dexa? Yes Colorectal: Current  Plan:  Will refer you to have your teeth cleaned and put in for Amoxicillin. Fill out the advance directive packet and bring back to our office.  Good look with your upcoming procedure!  I have personally reviewed and noted the following in the patient's chart:   . Medical and social history . Use of alcohol, tobacco or illicit drugs  . Current medications and supplements .  Functional ability and status . Nutritional status . Physical activity . Advanced directives . List of other physicians . Hospitalizations, surgeries, and ER visits in previous 12 months . Vitals . Screenings to include cognitive, depression, and falls . Referrals and appointments  In addition, I have reviewed and discussed with patient certain preventive protocols, quality metrics, and best practice recommendations. A written personalized care plan for preventive services as well as general preventive health recommendations were provided to patient.  Steva Coldermily P Shylin Keizer, CMA  10/07/2018

## 2018-10-07 NOTE — Patient Instructions (Signed)
You spoke to [name], [place] for your annual wellness visit.  We discussed goals: Goals    . Blood Pressure < 140/90    . Exercise 4-5x a week (pt-stated)     Good job walking 3x per week 30 minutes a time.  Lets try to increase to 4-5x per week 30 minutes each time.       We also discussed recommended health maintenance. You received your flu vaccine at your pharmacy, I have updated your HM. If interested in Tdap, your PCP can send prescription to your pharmacy at next visit. You are due back to see Dr. Nori Riis in December.   Health Maintenance  Topic Date Due  . TETANUS/TDAP  07/21/2011  . INFLUENZA VACCINE  08/21/2018  . DEXA SCAN  Completed  . PNA vac Low Risk Adult  Completed   Will refer you to have your teeth cleaned and put in for Amoxicillin. Fill out the advance directive packet and bring back to our office.  Good look with your upcoming procedure!   Preventive Care 108 Years and Older, Female Preventive care refers to lifestyle choices and visits with your health care provider that can promote health and wellness. This includes:  A yearly physical exam. This is also called an annual well check.  Regular dental and eye exams.  Immunizations.  Screening for certain conditions.  Healthy lifestyle choices, such as diet and exercise. What can I expect for my preventive care visit? Physical exam Your health care provider will check:  Height and weight. These may be used to calculate body mass index (BMI), which is a measurement that tells if you are at a healthy weight.  Heart rate and blood pressure.  Your skin for abnormal spots. Counseling Your health care provider may ask you questions about:  Alcohol, tobacco, and drug use.  Emotional well-being.  Home and relationship well-being.  Sexual activity.  Eating habits.  History of falls.  Memory and ability to understand (cognition).  Work and work Statistician.  Pregnancy and menstrual history.  What immunizations do I need?  Influenza (flu) vaccine  This is recommended every year. Tetanus, diphtheria, and pertussis (Tdap) vaccine  You may need a Td booster every 10 years. Varicella (chickenpox) vaccine  You may need this vaccine if you have not already been vaccinated. Zoster (shingles) vaccine  You may need this after age 87. Pneumococcal conjugate (PCV13) vaccine  One dose is recommended after age 24. Pneumococcal polysaccharide (PPSV23) vaccine  One dose is recommended after age 53. Measles, mumps, and rubella (MMR) vaccine  You may need at least one dose of MMR if you were born in 1957 or later. You may also need a second dose. Meningococcal conjugate (MenACWY) vaccine  You may need this if you have certain conditions. Hepatitis A vaccine  You may need this if you have certain conditions or if you travel or work in places where you may be exposed to hepatitis A. Hepatitis B vaccine  You may need this if you have certain conditions or if you travel or work in places where you may be exposed to hepatitis B. Haemophilus influenzae type b (Hib) vaccine  You may need this if you have certain conditions. You may receive vaccines as individual doses or as more than one vaccine together in one shot (combination vaccines). Talk with your health care provider about the risks and benefits of combination vaccines. What tests do I need? Blood tests  Lipid and cholesterol levels. These may  be checked every 5 years, or more frequently depending on your overall health.  Hepatitis C test.  Hepatitis B test. Screening  Lung cancer screening. You may have this screening every year starting at age 45 if you have a 30-pack-year history of smoking and currently smoke or have quit within the past 15 years.  Colorectal cancer screening. All adults should have this screening starting at age 54 and continuing until age 15. Your health care provider may recommend screening at age  8 if you are at increased risk. You will have tests every 1-10 years, depending on your results and the type of screening test.  Diabetes screening. This is done by checking your blood sugar (glucose) after you have not eaten for a while (fasting). You may have this done every 1-3 years.  Mammogram. This may be done every 1-2 years. Talk with your health care provider about how often you should have regular mammograms.  BRCA-related cancer screening. This may be done if you have a family history of breast, ovarian, tubal, or peritoneal cancers. Other tests  Sexually transmitted disease (STD) testing.  Bone density scan. This is done to screen for osteoporosis. You may have this done starting at age 68. Follow these instructions at home: Eating and drinking  Eat a diet that includes fresh fruits and vegetables, whole grains, lean protein, and low-fat dairy products. Limit your intake of foods with high amounts of sugar, saturated fats, and salt.  Take vitamin and mineral supplements as recommended by your health care provider.  Do not drink alcohol if your health care provider tells you not to drink.  If you drink alcohol: ? Limit how much you have to 0-1 drink a day. ? Be aware of how much alcohol is in your drink. In the [place] one drink equals one 12 oz bottle of beer (355 mL), one 5 oz glass of wine (148 mL), or one 1 oz glass of hard liquor (44 mL). Lifestyle  Take daily care of your teeth and gums.  Stay active. Exercise for at least 30 minutes on 5 or more days each week.  Do not use any products that contain nicotine or tobacco, such as cigarettes, e-cigarettes, and chewing tobacco. If you need help quitting, ask your health care provider.  If you are sexually active, practice safe sex. Use a condom or other form of protection in order to prevent STIs (sexually transmitted infections).  Talk with your health care provider about taking a low-dose aspirin or statin. What's  next?  Go to your health care provider once a year for a well check visit.  Ask your health care provider how often you should have your eyes and teeth checked.  Stay up to date on all vaccines. This information is not intended to replace advice given to you by your health care provider. Make sure you discuss any questions you have with your health care provider. Document Released: 02/02/2015 Document Revised: 12/31/2017 Document Reviewed: 12/31/2017 Elsevier Patient Education  2020 [facility]'s number is [phone]. Please call with questions or concerns about what we discussed today.

## 2018-10-13 NOTE — Progress Notes (Signed)
I have reviewed this visit and agree with the documentation.   

## 2018-11-08 ENCOUNTER — Ambulatory Visit (INDEPENDENT_AMBULATORY_CARE_PROVIDER_SITE_OTHER): Payer: Medicare Other | Admitting: *Deleted

## 2018-11-08 ENCOUNTER — Other Ambulatory Visit: Payer: Self-pay

## 2018-11-08 DIAGNOSIS — I482 Chronic atrial fibrillation, unspecified: Secondary | ICD-10-CM

## 2018-11-08 DIAGNOSIS — Z952 Presence of prosthetic heart valve: Secondary | ICD-10-CM

## 2018-11-08 DIAGNOSIS — Z5181 Encounter for therapeutic drug level monitoring: Secondary | ICD-10-CM | POA: Diagnosis not present

## 2018-11-08 LAB — BASIC METABOLIC PANEL
BUN/Creatinine Ratio: 16 (ref 12–28)
BUN: 24 mg/dL (ref 8–27)
CO2: 27 mmol/L (ref 20–29)
Calcium: 9.8 mg/dL (ref 8.7–10.3)
Chloride: 103 mmol/L (ref 96–106)
Creatinine, Ser: 1.54 mg/dL — ABNORMAL HIGH (ref 0.57–1.00)
GFR calc Af Amer: 36 mL/min/{1.73_m2} — ABNORMAL LOW (ref >59)
GFR calc non Af Amer: 31 mL/min/{1.73_m2} — ABNORMAL LOW (ref >59)
Glucose: 97 mg/dL (ref 65–99)
Potassium: 3.3 mmol/L — ABNORMAL LOW (ref 3.5–5.2)
Sodium: 138 mmol/L (ref 134–144)

## 2018-11-08 LAB — POCT INR: INR: 3.1 — AB (ref 2.0–3.0)

## 2018-11-08 MED ORDER — ENOXAPARIN SODIUM 80 MG/0.8ML ~~LOC~~ SOLN: 80.0000 mg | SUBCUTANEOUS | 1 refills | Status: DC

## 2018-11-08 NOTE — Patient Instructions (Addendum)
  Description   Continue on same dosage 1/2 tablet every day except 1 tablet on Mondays and Wednesdays. Refer to pt instructions below. Recheck INR 1 week post procedure.  Call if placed on any new medications or if scheduled for any procedures [phone].      10/21: Last dose of Coumadin.  10/22: No Coumadin or Lovenox.  10/23: Inject Lovenox 80 mg in the fatty abdominal tissue at least 2 inches from the belly button 8pm. No Coumadin.  10/24: Inject Lovenox in the fatty tissue at 8pm. No Coumadin.  10/25: Inject Lovenox in the fatty tissue at 8pm. No Coumadin.  10/26: No Lovenox. No Coumadin.  10/27: Procedure Day - No Lovenox - Resume Coumadin in the evening or as directed by doctor (take an extra half tablet with usual dose).  10/28: Resume Lovenox inject in the fatty tissue at 8am and take Coumadin (take an extra half tablet with usual dose).  10/29: Inject Lovenox in the fatty tissue at 8am take Coumadin.  10/30: Inject Lovenox in the fatty tissue at 8am take Coumadin.  10/31: Inject Lovenox in the fatty tissue at 8am take Coumadin.  11/1: Inject Lovenox in the fatty tissue at 8am take Coumadin.  11/2: Inject Lovenox in the fatty tissue at 8am and report to Coumadin appt to check INR.

## 2018-11-09 ENCOUNTER — Other Ambulatory Visit: Payer: Self-pay | Admitting: Internal Medicine

## 2018-11-09 DIAGNOSIS — Z952 Presence of prosthetic heart valve: Secondary | ICD-10-CM

## 2018-11-10 NOTE — Telephone Encounter (Signed)
Pt calling requesting a refill on amoxicillin for a dental procedure. Would Dr. Lovena Le like to refill this medication? Please address

## 2018-11-11 DIAGNOSIS — Z1159 Encounter for screening for other viral diseases: Secondary | ICD-10-CM | POA: Diagnosis not present

## 2018-11-12 ENCOUNTER — Telehealth: Payer: Self-pay

## 2018-11-12 DIAGNOSIS — E876 Hypokalemia: Secondary | ICD-10-CM

## 2018-11-12 MED ORDER — METOPROLOL TARTRATE 50 MG PO TABS: 75.0000 mg | ORAL_TABLET | Freq: Two times a day (BID) | ORAL | 3 refills | Status: DC

## 2018-11-12 NOTE — Telephone Encounter (Signed)
Notes recorded by Damian Leavell, RN on 11/10/2018 at 4:26 PM EDT  Patient called. Pt advised to increase potassium in diet. Pt has questions on current medication regimen. Will research further.  ------   Notes recorded by Evans Lance, MD on 11/08/2018 at 10:12 PM EDT  Potassium is low. She will need to increase the potassium in her diet and recheck bmp in 6 weeks.

## 2018-11-12 NOTE — Telephone Encounter (Signed)
Confirmed Pt medication change was done in error.  Correct metoprolol dosing in Pt med list and sent to pharmacy.  Attempted to notify Pt but was cut off.  Called back and left detailed message advising metoprolol tartrate 50 mg-Take 1.5 mg BID.  Advised would schedule repeat lab work.  Would be on AVS after next coumadin appt.  Advised to call back if any further questions.

## 2018-11-16 ENCOUNTER — Telehealth: Payer: Self-pay

## 2018-11-16 NOTE — Telephone Encounter (Signed)
Pt called into clinic, she was scheduled for endoscopy today, however when she got there the nurse asked when her last dosage of Coumadin was and pt did not bring paper and nurse confused her and she was unable to tell her exactly when her last dosage of Coumadin was.  Nurse discussed with MD and they decided to cancel the procedure.  Procedure has not been rescheduled.  Advised pt to go back on Lovenox this pm and continue to take the Lovenox once daily in the pm. Pt had written instructions from her visit on 11/08/18, we went over it day by day and pt wrote down dosage of Warfarin and pm Lovenox shot. Tuesday 10/27 10mg  and pm Lovenox, Wednesday 10/28 15mg  and pm Lovenox, Thursday 10/29 5mg  pm Lovenox, Friday 10/30 5mg  and pm Lovenox, Saturday 10/31 5mg  and pm Lovenox, Sunday 11/1 5mg  and pm Lovenox.  Kepp scheduled appt to check INR on 11/22/18. Will await call back for date endoscopy is going to be rescheduled to.

## 2018-11-17 ENCOUNTER — Other Ambulatory Visit: Payer: Self-pay

## 2018-11-17 ENCOUNTER — Ambulatory Visit
Admission: RE | Admit: 2018-11-17 | Discharge: 2018-11-17 | Disposition: A | Discharge status: Home / Self Care | Payer: Medicare Other | Source: Ambulatory Visit | Attending: Family Medicine | Admitting: Family Medicine

## 2018-11-17 DIAGNOSIS — Z1231 Encounter for screening mammogram for malignant neoplasm of breast: Secondary | ICD-10-CM

## 2018-11-22 ENCOUNTER — Other Ambulatory Visit: Payer: Self-pay

## 2018-11-22 ENCOUNTER — Ambulatory Visit (INDEPENDENT_AMBULATORY_CARE_PROVIDER_SITE_OTHER): Payer: Medicare Other | Admitting: *Deleted

## 2018-11-22 DIAGNOSIS — Z952 Presence of prosthetic heart valve: Secondary | ICD-10-CM | POA: Diagnosis not present

## 2018-11-22 DIAGNOSIS — Z5181 Encounter for therapeutic drug level monitoring: Secondary | ICD-10-CM

## 2018-11-22 DIAGNOSIS — I482 Chronic atrial fibrillation, unspecified: Secondary | ICD-10-CM | POA: Diagnosis not present

## 2018-11-22 LAB — POCT INR: INR: 1.9 — AB (ref 2.0–3.0)

## 2018-11-22 MED ORDER — ENOXAPARIN SODIUM 80 MG/0.8ML ~~LOC~~ SOLN: 80.0000 mg | SUBCUTANEOUS | 1 refills | Status: DC

## 2018-11-22 NOTE — Progress Notes (Signed)
Called and spoke to [employer]. Clarified Lovenox order.  Lovenox 80mg /0.8ML injection. Pt is suppose to inject 0.8MLs into the skin daily. Pt should receive 10 syringes.

## 2018-11-22 NOTE — Patient Instructions (Addendum)
Description   Continue Lovenox once a day. Continue taking coumadin. Take 1.5 tablets today and 1 tablet tomorrow. Then continue on same dosage 1/2 tablet every day except 1 tablet on Mondays and Wednesdays. Recheck INR 11/5.  Call if placed on any new medications or if scheduled for any procedures [phone].

## 2018-11-26 ENCOUNTER — Other Ambulatory Visit: Payer: Self-pay

## 2018-11-26 ENCOUNTER — Ambulatory Visit (INDEPENDENT_AMBULATORY_CARE_PROVIDER_SITE_OTHER): Payer: Medicare Other

## 2018-11-26 DIAGNOSIS — Z952 Presence of prosthetic heart valve: Secondary | ICD-10-CM

## 2018-11-26 DIAGNOSIS — Z5181 Encounter for therapeutic drug level monitoring: Secondary | ICD-10-CM

## 2018-11-26 DIAGNOSIS — I482 Chronic atrial fibrillation, unspecified: Secondary | ICD-10-CM

## 2018-11-26 LAB — POCT INR: INR: 3 (ref 2.0–3.0)

## 2018-11-26 NOTE — Patient Instructions (Signed)
Description   Continue on same dosage 1/2 tablet every day except 1 tablet on Mondays and Wednesdays. Stop the Lovenox injections.  Recheck in 2 weeks.  Call if placed on any new medications or if scheduled for any procedures [phone].

## 2018-12-01 ENCOUNTER — Other Ambulatory Visit: Payer: Self-pay | Admitting: Internal Medicine

## 2018-12-13 ENCOUNTER — Ambulatory Visit (INDEPENDENT_AMBULATORY_CARE_PROVIDER_SITE_OTHER): Payer: Medicare Other | Admitting: *Deleted

## 2018-12-13 ENCOUNTER — Other Ambulatory Visit: Payer: Self-pay

## 2018-12-13 DIAGNOSIS — Z952 Presence of prosthetic heart valve: Secondary | ICD-10-CM

## 2018-12-13 DIAGNOSIS — I482 Chronic atrial fibrillation, unspecified: Secondary | ICD-10-CM

## 2018-12-13 DIAGNOSIS — Z5181 Encounter for therapeutic drug level monitoring: Secondary | ICD-10-CM

## 2018-12-13 LAB — POCT INR: INR: 2.4 (ref 2.0–3.0)

## 2018-12-13 NOTE — Patient Instructions (Addendum)
Description    Take 1 tablet tomorrow, then continue on same dosage 1/2 tablet every day except 1 tablet on Mondays and Fridays. Call if placed on any new medications or if scheduled for any procedures [phone].

## 2018-12-20 ENCOUNTER — Telehealth: Payer: Self-pay

## 2018-12-20 NOTE — Telephone Encounter (Signed)
Pt calling nurse line to inquire if she can go to funeral home and view her sisters body. Pt sister died of COVID. Per Dr. Ardelia Mems, ok to go to funeral home. Dr. Ardelia Mems suggested not touching the body and to wear a mask the entire time pt is there. Pt was informed and had good understanding. Ottis Stain, CMA

## 2018-12-24 ENCOUNTER — Other Ambulatory Visit: Payer: Medicare Other

## 2018-12-29 ENCOUNTER — Other Ambulatory Visit: Payer: Self-pay

## 2018-12-29 ENCOUNTER — Ambulatory Visit (INDEPENDENT_AMBULATORY_CARE_PROVIDER_SITE_OTHER): Payer: Medicare Other

## 2018-12-29 DIAGNOSIS — I482 Chronic atrial fibrillation, unspecified: Secondary | ICD-10-CM | POA: Diagnosis not present

## 2018-12-29 DIAGNOSIS — Z952 Presence of prosthetic heart valve: Secondary | ICD-10-CM | POA: Diagnosis not present

## 2018-12-29 DIAGNOSIS — Z5181 Encounter for therapeutic drug level monitoring: Secondary | ICD-10-CM

## 2018-12-29 LAB — POCT INR: INR: 2.4 (ref 2.0–3.0)

## 2018-12-29 NOTE — Patient Instructions (Signed)
Description   Start taking 1/2 tablet every day except 1 tablet on Mondays, Wednesdays and Fridays. Recheck in 3 weeks. Call if placed on any new medications or if scheduled for any procedures [phone].

## 2019-01-07 DIAGNOSIS — H524 Presbyopia: Secondary | ICD-10-CM | POA: Diagnosis not present

## 2019-01-07 DIAGNOSIS — H2513 Age-related nuclear cataract, bilateral: Secondary | ICD-10-CM | POA: Diagnosis not present

## 2019-01-19 ENCOUNTER — Ambulatory Visit (INDEPENDENT_AMBULATORY_CARE_PROVIDER_SITE_OTHER): Payer: Medicare Other | Admitting: *Deleted

## 2019-01-19 ENCOUNTER — Encounter (INDEPENDENT_AMBULATORY_CARE_PROVIDER_SITE_OTHER): Payer: Self-pay

## 2019-01-19 ENCOUNTER — Other Ambulatory Visit: Payer: Self-pay

## 2019-01-19 DIAGNOSIS — I482 Chronic atrial fibrillation, unspecified: Secondary | ICD-10-CM

## 2019-01-19 DIAGNOSIS — Z952 Presence of prosthetic heart valve: Secondary | ICD-10-CM

## 2019-01-19 DIAGNOSIS — Z5181 Encounter for therapeutic drug level monitoring: Secondary | ICD-10-CM

## 2019-01-19 LAB — POCT INR: INR: 3.7 — AB (ref 2.0–3.0)

## 2019-01-19 NOTE — Patient Instructions (Signed)
Description   Today take 1/2 tablet then continue taking 1/2 tablet every day except 1 tablet on Mondays, Wednesdays and Fridays. Recheck in 3 weeks. Call if placed on any new medications or if scheduled for any procedures [phone].

## 2019-02-09 ENCOUNTER — Other Ambulatory Visit: Payer: Self-pay

## 2019-02-09 ENCOUNTER — Ambulatory Visit (INDEPENDENT_AMBULATORY_CARE_PROVIDER_SITE_OTHER): Payer: Medicare Other

## 2019-02-09 DIAGNOSIS — I482 Chronic atrial fibrillation, unspecified: Secondary | ICD-10-CM

## 2019-02-09 DIAGNOSIS — Z5181 Encounter for therapeutic drug level monitoring: Secondary | ICD-10-CM | POA: Diagnosis not present

## 2019-02-09 DIAGNOSIS — Z952 Presence of prosthetic heart valve: Secondary | ICD-10-CM | POA: Diagnosis not present

## 2019-02-09 LAB — POCT INR: INR: 3.7 — AB (ref 2.0–3.0)

## 2019-02-09 NOTE — Patient Instructions (Signed)
Description   Skip today's dosage of Coumadin, then resume same dosage 1/2 tablet every day except 1 tablet on Mondays, Wednesdays and Fridays. Recheck in 3 weeks. Call if placed on any new medications or if scheduled for any procedures [phone].

## 2019-02-28 ENCOUNTER — Other Ambulatory Visit: Payer: Self-pay

## 2019-02-28 ENCOUNTER — Ambulatory Visit (INDEPENDENT_AMBULATORY_CARE_PROVIDER_SITE_OTHER): Payer: Medicare Other | Admitting: *Deleted

## 2019-02-28 DIAGNOSIS — Z952 Presence of prosthetic heart valve: Secondary | ICD-10-CM

## 2019-02-28 DIAGNOSIS — I482 Chronic atrial fibrillation, unspecified: Secondary | ICD-10-CM | POA: Diagnosis not present

## 2019-02-28 DIAGNOSIS — Z5181 Encounter for therapeutic drug level monitoring: Secondary | ICD-10-CM | POA: Diagnosis not present

## 2019-02-28 LAB — POCT INR: INR: 2.9 (ref 2.0–3.0)

## 2019-02-28 NOTE — Patient Instructions (Signed)
Description   Continue taking 1/2 tablet every day except 1 tablet on Mondays, Wednesdays and Fridays. Recheck in 4 weeks. Call if placed on any new medications or if scheduled for any procedures [phone].

## 2019-03-02 DIAGNOSIS — H25011 Cortical age-related cataract, right eye: Secondary | ICD-10-CM | POA: Diagnosis not present

## 2019-03-02 DIAGNOSIS — H2511 Age-related nuclear cataract, right eye: Secondary | ICD-10-CM | POA: Diagnosis not present

## 2019-03-02 DIAGNOSIS — H2589 Other age-related cataract: Secondary | ICD-10-CM | POA: Diagnosis not present

## 2019-03-02 DIAGNOSIS — H25811 Combined forms of age-related cataract, right eye: Secondary | ICD-10-CM | POA: Diagnosis not present

## 2019-03-28 ENCOUNTER — Ambulatory Visit (INDEPENDENT_AMBULATORY_CARE_PROVIDER_SITE_OTHER): Payer: Medicare Other | Admitting: *Deleted

## 2019-03-28 ENCOUNTER — Other Ambulatory Visit: Payer: Self-pay

## 2019-03-28 DIAGNOSIS — Z952 Presence of prosthetic heart valve: Secondary | ICD-10-CM

## 2019-03-28 DIAGNOSIS — Z5181 Encounter for therapeutic drug level monitoring: Secondary | ICD-10-CM | POA: Diagnosis not present

## 2019-03-28 DIAGNOSIS — I482 Chronic atrial fibrillation, unspecified: Secondary | ICD-10-CM | POA: Diagnosis not present

## 2019-03-28 LAB — POCT INR: INR: 4.7 — AB (ref 2.0–3.0)

## 2019-03-28 NOTE — Patient Instructions (Signed)
Description   Hold today's dose and have something green leafy today then continue taking 1/2 tablet every day except 1 tablet on Mondays, Wednesdays and Fridays. Recheck in 3 weeks. Call if placed on any new medications or if scheduled for any procedures [phone].

## 2019-04-20 ENCOUNTER — Ambulatory Visit (INDEPENDENT_AMBULATORY_CARE_PROVIDER_SITE_OTHER): Payer: Medicare Other | Admitting: *Deleted

## 2019-04-20 ENCOUNTER — Other Ambulatory Visit: Payer: Self-pay

## 2019-04-20 DIAGNOSIS — Z5181 Encounter for therapeutic drug level monitoring: Secondary | ICD-10-CM

## 2019-04-20 DIAGNOSIS — I482 Chronic atrial fibrillation, unspecified: Secondary | ICD-10-CM

## 2019-04-20 DIAGNOSIS — Z952 Presence of prosthetic heart valve: Secondary | ICD-10-CM | POA: Diagnosis not present

## 2019-04-20 LAB — POCT INR: INR: 4.1 — AB (ref 2.0–3.0)

## 2019-04-20 NOTE — Patient Instructions (Signed)
Description   Hold today's dose  then start taking 1/2 tablet every day except 1 tablet on Mondays and Fridays. Recheck in 3 weeks. Call if placed on any new medications or if scheduled for any procedures [phone].

## 2019-04-26 ENCOUNTER — Other Ambulatory Visit: Payer: Self-pay | Admitting: Internal Medicine

## 2019-04-27 ENCOUNTER — Other Ambulatory Visit: Payer: Self-pay | Admitting: Internal Medicine

## 2019-05-03 ENCOUNTER — Other Ambulatory Visit: Payer: Self-pay | Admitting: Internal Medicine

## 2019-05-11 ENCOUNTER — Ambulatory Visit (INDEPENDENT_AMBULATORY_CARE_PROVIDER_SITE_OTHER): Payer: Medicare Other

## 2019-05-11 ENCOUNTER — Other Ambulatory Visit: Payer: Self-pay

## 2019-05-11 ENCOUNTER — Encounter: Payer: Self-pay | Admitting: Family Medicine

## 2019-05-11 DIAGNOSIS — Z952 Presence of prosthetic heart valve: Secondary | ICD-10-CM | POA: Diagnosis not present

## 2019-05-11 DIAGNOSIS — I482 Chronic atrial fibrillation, unspecified: Secondary | ICD-10-CM

## 2019-05-11 DIAGNOSIS — Z5181 Encounter for therapeutic drug level monitoring: Secondary | ICD-10-CM

## 2019-05-11 LAB — POCT INR: INR: 2 (ref 2.0–3.0)

## 2019-05-11 NOTE — Patient Instructions (Signed)
Description   Take 1 tablet today, then resume same dosage 1/2 tablet every day except 1 tablet on Mondays and Fridays. Recheck in 3 weeks. Call if placed on any new medications or if scheduled for any procedures [phone].

## 2019-05-17 ENCOUNTER — Other Ambulatory Visit: Payer: Self-pay | Admitting: Internal Medicine

## 2019-05-20 ENCOUNTER — Other Ambulatory Visit: Payer: Self-pay

## 2019-05-20 MED ORDER — HYDROCHLOROTHIAZIDE 25 MG PO TABS: 25.0000 mg | ORAL_TABLET | Freq: Every day | ORAL | 3 refills | Status: DC

## 2019-05-25 ENCOUNTER — Encounter: Payer: Self-pay | Admitting: Family Medicine

## 2019-05-27 DIAGNOSIS — H401133 Primary open-angle glaucoma, bilateral, severe stage: Secondary | ICD-10-CM | POA: Diagnosis not present

## 2019-05-30 ENCOUNTER — Other Ambulatory Visit: Payer: Self-pay | Admitting: Internal Medicine

## 2019-05-30 DIAGNOSIS — Z952 Presence of prosthetic heart valve: Secondary | ICD-10-CM

## 2019-05-31 ENCOUNTER — Telehealth: Payer: Self-pay | Admitting: Internal Medicine

## 2019-05-31 NOTE — Telephone Encounter (Signed)
Pt's medication was sent to pt's pharmacy as requested. Confirmation received.  °

## 2019-05-31 NOTE — Telephone Encounter (Signed)
*  STAT* If patient is at the pharmacy, call can be transferred to refill team.   1. Which medications need to be refilled? (please list name of each medication and dose if known) amoxicillin (AMOXIL) 500 MG tablet  2. Which pharmacy/location (including street and city if local pharmacy) is medication to be sent to? [id number] - [place], [place] - [address] AT [facility] [address]  3. Do they need a 30 day or 90 day supply? n/a

## 2019-06-01 ENCOUNTER — Encounter: Payer: Medicare Other | Admitting: Family Medicine

## 2019-06-02 ENCOUNTER — Ambulatory Visit (INDEPENDENT_AMBULATORY_CARE_PROVIDER_SITE_OTHER): Payer: Medicare Other | Admitting: *Deleted

## 2019-06-02 ENCOUNTER — Other Ambulatory Visit: Payer: Self-pay

## 2019-06-02 DIAGNOSIS — I482 Chronic atrial fibrillation, unspecified: Secondary | ICD-10-CM | POA: Diagnosis not present

## 2019-06-02 DIAGNOSIS — Z952 Presence of prosthetic heart valve: Secondary | ICD-10-CM | POA: Diagnosis not present

## 2019-06-02 DIAGNOSIS — Z5181 Encounter for therapeutic drug level monitoring: Secondary | ICD-10-CM

## 2019-06-02 LAB — POCT INR: INR: 2.6 (ref 2.0–3.0)

## 2019-06-02 NOTE — Patient Instructions (Addendum)
Description   Continue taking 1/2 tablet every day except 1 tablet on Mondays and Fridays. Recheck in 4 weeks. Call if placed on any new medications or if scheduled for any procedures [phone].     If your Gastrointestinal Doctor decides that you need a procedure please have them call us at clearance form at [phone] or fax [phone]

## 2019-06-21 DIAGNOSIS — K7460 Unspecified cirrhosis of liver: Secondary | ICD-10-CM | POA: Diagnosis not present

## 2019-06-21 DIAGNOSIS — K635 Polyp of colon: Secondary | ICD-10-CM | POA: Diagnosis not present

## 2019-06-22 ENCOUNTER — Encounter: Payer: Self-pay | Admitting: Family Medicine

## 2019-06-22 ENCOUNTER — Other Ambulatory Visit: Payer: Self-pay | Admitting: Gastroenterology

## 2019-06-22 DIAGNOSIS — K7460 Unspecified cirrhosis of liver: Secondary | ICD-10-CM

## 2019-06-27 ENCOUNTER — Other Ambulatory Visit: Payer: Self-pay | Admitting: Internal Medicine

## 2019-06-28 NOTE — Telephone Encounter (Signed)
*  STAT* If patient is at the pharmacy, call can be transferred to refill team.   1. Which medications need to be refilled? (please list name of each medication and dose if known)  Digoxin  2. Which pharmacy/location (including street and city if local pharmacy) is medication to be sent to? [facility] - [place], [place]  3. Do they need a 30 day or 90 day supply? 90 days and refills

## 2019-06-28 NOTE — Telephone Encounter (Signed)
[name] is calling back stating she only gets 45 tablets instead of 90 now and just wanted them aware since she previously said otherwise when requesting the refill. Please advise.

## 2019-06-29 ENCOUNTER — Encounter: Payer: Self-pay | Admitting: Family Medicine

## 2019-06-29 ENCOUNTER — Other Ambulatory Visit: Payer: Self-pay

## 2019-06-29 ENCOUNTER — Ambulatory Visit (INDEPENDENT_AMBULATORY_CARE_PROVIDER_SITE_OTHER): Payer: Medicare Other | Admitting: *Deleted

## 2019-06-29 DIAGNOSIS — K7460 Unspecified cirrhosis of liver: Secondary | ICD-10-CM | POA: Diagnosis not present

## 2019-06-29 DIAGNOSIS — I482 Chronic atrial fibrillation, unspecified: Secondary | ICD-10-CM | POA: Diagnosis not present

## 2019-06-29 DIAGNOSIS — Z952 Presence of prosthetic heart valve: Secondary | ICD-10-CM | POA: Diagnosis not present

## 2019-06-29 DIAGNOSIS — Z5181 Encounter for therapeutic drug level monitoring: Secondary | ICD-10-CM

## 2019-06-29 LAB — POCT INR: INR: 2.9 (ref 2.0–3.0)

## 2019-06-29 NOTE — Patient Instructions (Signed)
Description   Continue taking 1/2 tablet every day except 1 tablet on Mondays and Fridays. Recheck in 5  weeks. Call if placed on any new medications or if scheduled for any procedures [phone].

## 2019-06-30 ENCOUNTER — Other Ambulatory Visit: Payer: Medicare Other

## 2019-07-07 ENCOUNTER — Ambulatory Visit
Admission: RE | Admit: 2019-07-07 | Discharge: 2019-07-07 | Disposition: A | Discharge status: Home / Self Care | Payer: Medicare Other | Source: Ambulatory Visit | Attending: Gastroenterology | Admitting: Gastroenterology

## 2019-07-07 DIAGNOSIS — K7460 Unspecified cirrhosis of liver: Secondary | ICD-10-CM

## 2019-07-19 ENCOUNTER — Ambulatory Visit: Payer: Medicare Other | Admitting: Internal Medicine

## 2019-07-20 ENCOUNTER — Encounter: Payer: Self-pay | Admitting: Family Medicine

## 2019-07-20 ENCOUNTER — Ambulatory Visit (INDEPENDENT_AMBULATORY_CARE_PROVIDER_SITE_OTHER): Payer: Medicare Other | Admitting: Family Medicine

## 2019-07-20 ENCOUNTER — Other Ambulatory Visit: Payer: Self-pay

## 2019-07-20 VITALS — BP 132/64 | HR 75 | Ht 68.0 in | Wt 168.8 lb

## 2019-07-20 DIAGNOSIS — I10 Essential (primary) hypertension: Secondary | ICD-10-CM | POA: Diagnosis not present

## 2019-07-20 DIAGNOSIS — K7460 Unspecified cirrhosis of liver: Secondary | ICD-10-CM

## 2019-07-20 DIAGNOSIS — I482 Chronic atrial fibrillation, unspecified: Secondary | ICD-10-CM | POA: Diagnosis not present

## 2019-07-20 NOTE — Patient Instructions (Signed)
We will get some blood work today and I will send you a note in the mail.  Your last ultrasound shoed that you have no sign of cancer. The GI doctor is  Going to do an ultrasound on you in 6 months and I will see you in 6 months also. Great to see you! Marland Kitchen

## 2019-07-21 LAB — COMPREHENSIVE METABOLIC PANEL
ALT: 13 IU/L (ref 0–32)
AST: 20 IU/L (ref 0–40)
Albumin/Globulin Ratio: 1.3 (ref 1.2–2.2)
Albumin: 4.6 g/dL (ref 3.6–4.6)
Alkaline Phosphatase: 62 IU/L (ref 48–121)
BUN/Creatinine Ratio: 16 (ref 12–28)
BUN: 22 mg/dL (ref 8–27)
Bilirubin Total: 0.6 mg/dL (ref 0.0–1.2)
CO2: 26 mmol/L (ref 20–29)
Calcium: 10 mg/dL (ref 8.7–10.3)
Chloride: 104 mmol/L (ref 96–106)
Creatinine, Ser: 1.41 mg/dL — ABNORMAL HIGH (ref 0.57–1.00)
GFR calc Af Amer: 40 mL/min/{1.73_m2} — ABNORMAL LOW (ref >59)
GFR calc non Af Amer: 35 mL/min/{1.73_m2} — ABNORMAL LOW (ref >59)
Globulin, Total: 3.5 g/dL (ref 1.5–4.5)
Glucose: 96 mg/dL (ref 65–99)
Potassium: 3.4 mmol/L — ABNORMAL LOW (ref 3.5–5.2)
Sodium: 143 mmol/L (ref 134–144)
Total Protein: 8.1 g/dL (ref 6.0–8.5)

## 2019-07-21 LAB — HEPATITIS B CORE ANTIBODY, IGM: Hep B C IgM: NEGATIVE

## 2019-07-21 LAB — HEPATITIS C ANTIBODY: Hep C Virus Ab: <0.1 s/co ratio (ref 0.0–0.9)

## 2019-07-23 NOTE — Progress Notes (Signed)
    CHIEF COMPLAINT / HPI:  Has questions and concerns about recent diagnosis of cirrhosis (seen incidentally on CT scan). She saw her GI doctor. Not sure what it all means. Also he wanted [place] to order some additional labs (?) according to her. 2. BP meds doing OK, no problems. Feeling pretty good. 3. Her left knee is botherng her several days a week. Using a cane. Knee swelss at times. Does not want to conisder any surgical intervention at this time   PERTINENT  PMH / PSH: I have reviewed the patient's medications, allergies, past medical and surgical history, smoking status and updated in the EMR as appropriate.   OBJECTIVE:  BP 132/64   Pulse 75   Ht 5\' 8"  (1.727 m)   Wt 168 lb 12.8 oz (76.6 kg)   SpO2 98%   BMI 25.67 kg/m  Vital signs reviewed. GENERAL: Well-developed, well-nourished, no acute distress. CARDIOVASCULAR: irreg irreg. LUNGS: Clear to auscultation bilaterally, no rales or wheeze. ABDOMEN: Soft positive bowel sounds NEURO: No gross focal neurological deficits. MSK: Movement of extremity x 4. L knee soft tissue swelling and chronic changes c/w prior tibial plateau fracture.   ASSESSMENT / PLAN:   Chronic atrial fibrillation (HCC) Followed by cardiology including her anticoagulation  HYPERTENSION, BENIGN SYSTEMIC Good control. No med problems. cotinue current regimen.  Cirrhosis of liver (HCC) We discussed at length. She had declined EGD by GI. I agree with her decision given her age and apparently no sx associated with the cirrhosis. I did explain to her what this was for (work up and eval for varices etc) and she may be amenable in future if the situation changes. I also explained the recent ultrasound and that there was no sign of liver cancer. She seemed very relieved. She will continue with GI follow up in 6 months.   I reviewed Dr. note (copy in chart) wiht her and I am not sure he wanted Hulen Shouts to get any labs other than CMP. Looks like he wanted to  get labs for further eval of cirrhosis--not sure if she went for those.   [place] MD

## 2019-07-23 NOTE — Assessment & Plan Note (Signed)
Followed by cardiology including her anticoagulation

## 2019-07-23 NOTE — Assessment & Plan Note (Signed)
Good control. No med problems. cotinue current regimen.

## 2019-07-23 NOTE — Assessment & Plan Note (Addendum)
We discussed at length. She had declined EGD by GI. I agree with her decision given her age and apparently no sx associated with the cirrhosis. I did explain to her what this was for (work up and eval for varices etc) and she may be amenable in future if the situation changes. I also explained the recent ultrasound and that there was no sign of liver cancer. She seemed very relieved. She will continue with GI follow up in 6 months.   I reviewed Dr. Hulen Shouts note (copy in chart) wiht her and I am not sure he wanted [place] to get any labs other than CMP. Looks like he wanted to get labs for further eval of cirrhosis--not sure if she went for those.

## 2019-07-29 ENCOUNTER — Encounter: Payer: Self-pay | Admitting: Family Medicine

## 2019-07-30 ENCOUNTER — Other Ambulatory Visit: Payer: Self-pay | Admitting: Internal Medicine

## 2019-08-01 ENCOUNTER — Other Ambulatory Visit: Payer: Self-pay | Admitting: Internal Medicine

## 2019-08-03 ENCOUNTER — Ambulatory Visit (INDEPENDENT_AMBULATORY_CARE_PROVIDER_SITE_OTHER): Payer: Medicare Other | Admitting: *Deleted

## 2019-08-03 ENCOUNTER — Other Ambulatory Visit: Payer: Self-pay

## 2019-08-03 DIAGNOSIS — I482 Chronic atrial fibrillation, unspecified: Secondary | ICD-10-CM

## 2019-08-03 DIAGNOSIS — Z5181 Encounter for therapeutic drug level monitoring: Secondary | ICD-10-CM

## 2019-08-03 DIAGNOSIS — Z952 Presence of prosthetic heart valve: Secondary | ICD-10-CM

## 2019-08-03 LAB — POCT INR: INR: 2.5 (ref 2.0–3.0)

## 2019-08-03 NOTE — Patient Instructions (Signed)
Description   Continue taking Warfarin 1/2 tablet every day except 1 tablet on Mondays and Fridays. Recheck in 6 weeks. Call if placed on any new medications or if scheduled for any procedures [phone].

## 2019-08-15 ENCOUNTER — Other Ambulatory Visit: Payer: Self-pay | Admitting: Internal Medicine

## 2019-08-16 ENCOUNTER — Other Ambulatory Visit: Payer: Self-pay

## 2019-08-16 ENCOUNTER — Encounter: Payer: Self-pay | Admitting: Internal Medicine

## 2019-08-16 ENCOUNTER — Other Ambulatory Visit: Payer: Self-pay | Admitting: Internal Medicine

## 2019-08-16 ENCOUNTER — Ambulatory Visit (INDEPENDENT_AMBULATORY_CARE_PROVIDER_SITE_OTHER): Payer: Medicare Other | Admitting: Internal Medicine

## 2019-08-16 VITALS — BP 190/100 | HR 87 | Ht 67.5 in | Wt 169.6 lb

## 2019-08-16 DIAGNOSIS — I482 Chronic atrial fibrillation, unspecified: Secondary | ICD-10-CM

## 2019-08-16 DIAGNOSIS — Z952 Presence of prosthetic heart valve: Secondary | ICD-10-CM | POA: Diagnosis not present

## 2019-08-16 DIAGNOSIS — I10 Essential (primary) hypertension: Secondary | ICD-10-CM | POA: Diagnosis not present

## 2019-08-16 MED ORDER — DILTIAZEM HCL ER COATED BEADS 240 MG PO CP24: 240.0000 mg | ORAL_CAPSULE | Freq: Every day | ORAL | 3 refills | Status: DC

## 2019-08-16 MED ORDER — DIGOXIN 125 MCG PO TABS: 0.1250 mg | ORAL_TABLET | ORAL | 3 refills | Status: DC

## 2019-08-16 MED ORDER — HYDROCHLOROTHIAZIDE 25 MG PO TABS: 25.0000 mg | ORAL_TABLET | Freq: Every day | ORAL | 3 refills | Status: DC

## 2019-08-16 MED ORDER — METOPROLOL TARTRATE 50 MG PO TABS: 75.0000 mg | ORAL_TABLET | Freq: Two times a day (BID) | ORAL | 3 refills | Status: DC

## 2019-08-16 NOTE — Telephone Encounter (Signed)
Pt would like to see if her pharmacy has sent over a fax to get her medications resent. Also would like to talk with Dr. [name] or nurse to see if fax has been received so that she can get her medication after her appt with Dr. [name] this morning.

## 2019-08-16 NOTE — Telephone Encounter (Signed)
*  STAT* If patient is at the pharmacy, call can be transferred to refill team.   1. Which medications need to be refilled? (please list name of each medication and dose if known) digoxin (LANOXIN) 0.125 MG tablet And metoprolol tartrate (LOPRESSOR) 50 MG tablet   2. Which pharmacy/location (including street and city if local pharmacy) is medication to be sent to? [id number] - [place],  - [address] AT [facility] [address]  3. Do they need a 30 day or 90 day supply? 90

## 2019-08-16 NOTE — Progress Notes (Signed)
HPI [name] returns today for followup of atrial fib and NSVT and HTN. She has mild AS by echo 2 years ago. She did not take her meds this morning. She has chronic peripheral edema in her left leg after falling and breaking it 4 years ago. She denies chest pain or sob. No syncope.  Allergies  Allergen Reactions  . Esomeprazole Magnesium     REACTION: stomach upset, nausea     Current Outpatient Medications  Medication Sig Dispense Refill  . Acetaminophen 500 MG coapsule Take 650 mg by mouth every 6 (six) hours as needed for fever.    . Cholecalciferol (VITAMIN D) 2000 units tablet Take 1 tablet (2,000 Units total) by mouth daily. 100 tablet 3  . digoxin (LANOXIN) 0.125 MG tablet Take 1 tablet (0.125 mg total) by mouth every other day. Please keep upcoming appt in June with Dr. [name] before anymore refills. Thank you 45 tablet 3  . diltiazem (CARDIZEM CD) 240 MG 24 hr capsule TAKE 1 CAPSULE BY MOUTH DAILY 90 capsule 3  . dorzolamidel-timolol (COSOPT) 22.3-6.8 MG/ML SOLN ophthalmic solution INT 1 GTT IN OU BID  4  . hydrochlorothiazide (HYDRODIURIL) 25 MG tablet Take 1 tablet (25 mg total) by mouth daily. 90 tablet 3  . LUMIGAN 0.01 % SOLN INT 1 GTT INTO OU QHS  0  . pantoprazole (PROTONIX) 40 MG tablet TAKE 1 TABLET BY MOUTH DAILY 90 tablet 3  . polyethylene glycol (MIRALAX / GLYCOLAX) packet Take 17 g by mouth 2 (two) times daily. 14 each 0  . warfarin (COUMADIN) 10 MG tablet Take 1/2 to 1 tablet as directed by [facility] 80 tablet 1  . metoprolol tartrate (LOPRESSOR) 50 MG tablet Take 1.5 tablets (75 mg total) by mouth 2 (two) times daily. 270 tablet 3   No current facility-administered medications for this visit.     Past Medical History:  Diagnosis Date  . Asthma    years ago  . Chronic atrial fibrillation (HCC)   . Dilated cardiomyopathy (HCC)    history of this, now resolved  . Dysphagia    Hx  . Fracture of tibial plateau 05/21/2015  . GERD  (gastroesophageal reflux disease)   . GI bleed   . Hemorrhoid 04/2014   bleeding  . History of blood transfusion   . Hypertension   . Microcytic anemia   . Nonsustained ventricular tachycardia (HCC)   . Osteoporosis   . Protein in urine   . RBBB (right bundle branch block)   . Rheumatic mitral valve disease     ROS:   All systems reviewed and negative except as noted in the HPI.   Past Surgical History:  Procedure Laterality Date  . CHOLECYSTECTOMY  2005  . COLONOSCOPY    . COLONOSCOPY W/ POLYPECTOMY    . EYE SURGERY  04/2014  . HEMICOLECTOMY  2008   forpolyps  . MITOMYCIN C APPLICATION Right 05/17/2014   Procedure: MITOMYCIN C APPLICATION;  Surgeon: Chalmers Guest, MD;  Location[facility] OR;  Service: Ophthalmology;  Laterality: Right;  . MITRAL VALVE REPLACEMENT  1987   St Jude mechanical valve  . TRABECULECTOMY Right 05/17/2014   Procedure: TRABECULECTOMY WITH MITOMYCIN RIGHT EYE;  Surgeon: Chalmers Guest, MD;  Location: [facility] OR;  Service: Ophthalmology;  Laterality: Right;  . TUBAL LIGATION    . US ECHOCARDIOGRAPHY  02/2008, 03/2006, 06/2004     Family History  Problem Relation Age of Onset  . Cancer Father  prostate cancer  . Hypertension Mother   . Stroke Mother        CVA x 2  . Cancer Sister        in the Bladder  . Endometriosis Daughter   . Stroke Sister   . Blindness Sister   . Hypertension Sister   . Thyroid disease Daughter   . Hypertension Daughter   . Hypertension Daughter   . Hypertension Daughter   . Liver disease Daughter   . Colon cancer Neg Hx   . Breast cancer Neg Hx      Social History   Socioeconomic History  . Marital status: Divorced    Spouse name: Not on file  . Number of children: 5  . Years of education: 75  . Highest education level: High school graduate  Occupational History  . Occupation: Retired  Tobacco Use  . Smoking status: Former Smoker    Packs/day: 1.50    Years: 15.00    Pack years: 22.50    Quit date: 1987     Years since quitting: 34.5  . Smokeless tobacco: Never Used  . Tobacco comment: quit 1987  Vaping Use  . Vaping Use: Never used  Substance and Sexual Activity  . Alcohol use: No  . Drug use: No  . Sexual activity: Not Currently  Other Topics Concern  . Not on file  Social History Narrative   Patient lives alone in [place].   Patient has 3 children who live near her and offer support. 2 live up [place].   Patient is active in [place] and enjoys walking for exercise.    Social Determinants of Health   Financial Resource Strain: Low Risk   . Difficulty of Paying Living Expenses: Not hard at all  Food Insecurity: No Food Insecurity  . Worried About Programme researcher, broadcasting/film/video in the Last Year: Never true  . Ran Out of Food in the Last Year: Never true  Transportation Needs: No Transportation Needs  . Lack of Transportation (Medical): No  . Lack of Transportation (Non-Medical): No  Physical Activity: Insufficiently Active  . Days of Exercise per Week: 3 days  . Minutes of Exercise per Session: 30 min  Stress: No Stress Concern Present  . Feeling of Stress : Not at all  Social Connections: Moderately Isolated  . Frequency of Communication with Friends and Family: Three times a week  . Frequency of Social Gatherings with Friends and Family: Three times a week  . Attends Religious Services: 1 to 4 times per year  . Active Member of Clubs or Organizations: No  . Attends Banker Meetings: Never  . Marital Status: Divorced  Catering manager Violence: Not At Risk  . Fear of Current or Ex-Partner: No  . Emotionally Abused: No  . Physically Abused: No  . Sexually Abused: No     BP (!) 190/100   Pulse 87   Ht 5' 7.5" (1.715 m)   Wt 169 lb 9.6 oz (76.9 kg)   SpO2 96%   BMI 26.17 kg/m   Physical Exam:  Well appearing NAD HEENT: Unremarkable Neck:  No JVD, no thyromegally Lymphatics:  No adenopathy Back:  No CVA tenderness Lungs:  Clear with no wheezes HEART:   IRegular rate rhythm, 2/6 systolic murmurs, no rubs, no clicks Abd:  soft, positive bowel sounds, no organomegally, no rebound, no guarding Ext:  2 plus pulses, no edema, no cyanosis, no clubbing Skin:  No rashes no nodules Neuro:  CN II  through XII intact, motor grossly intact  EKG -  Atrial fib with a controlled VR, RBBB, right superior axis  DEVICE  Normal device function.  See PaceArt for details.   Assess/Plan: 1. Atrial fib - her rates are well controlled.  2. Aortic stenosis - her mean gradient was 14 two years ago. She is asymptomatic. We will consider a repeat echo in a year or two, sooner if she were to develop symptoms. A2 is easily heard. 3. HTN - she did not take her meds this morning. I encouraged her to take them when she gets back home.  4. Diastolic heart failure - her symptoms remain class 2. She will continue her diuretic.   Leonia Reeves.D.

## 2019-08-16 NOTE — Telephone Encounter (Signed)
Will refill at appt

## 2019-08-16 NOTE — Patient Instructions (Addendum)
Medication Instructions:  Your physician recommends that you continue on your current medications as directed. Please refer to the Current Medication list given to you today.  *If you need a refill on your cardiac medications before your next appointment, please call your pharmacy*  Lab Work: You will get labs today BMP, Dig level If you have labs (blood work) drawn today and your tests are completely normal, you will receive your results only by: Marland Kitchen MyChart Message (if you have MyChart) OR . A paper copy in the mail If you have any lab test that is abnormal or we need to change your treatment, we will call you to review the results.  Testing/Procedures: None ordered.  Follow-Up: At [facility], you and your health needs are our priority.  As part of our continuing mission to provide you with exceptional heart care, we have created designated Provider Care Teams.  These Care Teams include your primary Cardiologist (physician) and Advanced Practice Providers (APPs -  Physician Assistants and Nurse Practitioners) who all work together to provide you with the care you need, when you need it.  We recommend signing up for the patient portal called "MyChart".  Sign up information is provided on this After Visit Summary.  MyChart is used to connect with patients for Virtual Visits (Telemedicine).  Patients are able to view lab/test results, encounter notes, upcoming appointments, etc.  Non-urgent messages can be sent to your provider as well.   To learn more about what you can do with MyChart, go to [email or web address].    Your next appointment:   Your physician wants you to follow-up in: 1 year with Dr. [name]. You will receive a reminder letter in the mail two months in advance. If you don't receive a letter, please call our office to schedule the follow-up appointment.    Other Instructions:

## 2019-08-17 LAB — BASIC METABOLIC PANEL
BUN/Creatinine Ratio: 13 (ref 12–28)
BUN: 18 mg/dL (ref 8–27)
CO2: 23 mmol/L (ref 20–29)
Calcium: 9.6 mg/dL (ref 8.7–10.3)
Chloride: 103 mmol/L (ref 96–106)
Creatinine, Ser: 1.37 mg/dL — ABNORMAL HIGH (ref 0.57–1.00)
GFR calc Af Amer: 42 mL/min/{1.73_m2} — ABNORMAL LOW (ref >59)
GFR calc non Af Amer: 36 mL/min/{1.73_m2} — ABNORMAL LOW (ref >59)
Glucose: 94 mg/dL (ref 65–99)
Potassium: 3.3 mmol/L — ABNORMAL LOW (ref 3.5–5.2)
Sodium: 142 mmol/L (ref 134–144)

## 2019-08-17 LAB — DIGOXIN LEVEL: Digoxin, Serum: 0.7 ng/mL (ref 0.5–0.9)

## 2019-08-17 MED ORDER — PANTOPRAZOLE SODIUM 40 MG PO TBEC: 40.0000 mg | DELAYED_RELEASE_TABLET | Freq: Every day | ORAL | 3 refills | Status: DC

## 2019-08-17 NOTE — Addendum Note (Signed)
Addended by: Othelia Riederer H on: 08/17/2019 05:32 PM ° ° Modules accepted: Orders ° °

## 2019-08-18 ENCOUNTER — Telehealth: Payer: Self-pay

## 2019-08-18 MED ORDER — POTASSIUM CHLORIDE ER 10 MEQ PO TBCR
20.0000 meq | EXTENDED_RELEASE_TABLET | Freq: Every day | ORAL | 3 refills | Status: DC
Start: 2019-08-18 — End: 2020-10-15

## 2019-08-18 NOTE — Telephone Encounter (Signed)
-----   Message from Marinus Maw, MD sent at 08/17/2019 10:38 PM EDT ----- Potassium is low. Ask her to take kdur 20 meq daily.

## 2019-08-18 NOTE — Telephone Encounter (Signed)
Call placed to Pt.  Advised to take potassium.  Pt unable to swallow 20 meq pills.  Will order 10 meq tablets.  Prescription sent to pharmacy.

## 2019-09-14 ENCOUNTER — Other Ambulatory Visit: Payer: Self-pay

## 2019-09-14 ENCOUNTER — Ambulatory Visit (INDEPENDENT_AMBULATORY_CARE_PROVIDER_SITE_OTHER): Payer: Medicare Other | Admitting: *Deleted

## 2019-09-14 DIAGNOSIS — I482 Chronic atrial fibrillation, unspecified: Secondary | ICD-10-CM | POA: Diagnosis not present

## 2019-09-14 DIAGNOSIS — Z5181 Encounter for therapeutic drug level monitoring: Secondary | ICD-10-CM

## 2019-09-14 DIAGNOSIS — Z952 Presence of prosthetic heart valve: Secondary | ICD-10-CM

## 2019-09-14 LAB — POCT INR: INR: 2.7 (ref 2.0–3.0)

## 2019-09-14 NOTE — Patient Instructions (Signed)
Description   °Continue taking Warfarin 1/2 tablet every day except 1 tablet on Mondays and Fridays. Recheck in 6 weeks. Call if placed on any new medications or if scheduled for any procedures [phone].  °  ° ° °

## 2019-10-19 DIAGNOSIS — I739 Peripheral vascular disease, unspecified: Secondary | ICD-10-CM | POA: Diagnosis not present

## 2019-10-19 DIAGNOSIS — L84 Corns and callosities: Secondary | ICD-10-CM | POA: Diagnosis not present

## 2019-10-19 DIAGNOSIS — M21611 Bunion of right foot: Secondary | ICD-10-CM | POA: Diagnosis not present

## 2019-10-19 DIAGNOSIS — M2042 Other hammer toe(s) (acquired), left foot: Secondary | ICD-10-CM | POA: Diagnosis not present

## 2019-10-19 DIAGNOSIS — M2041 Other hammer toe(s) (acquired), right foot: Secondary | ICD-10-CM | POA: Diagnosis not present

## 2019-10-20 DIAGNOSIS — H401133 Primary open-angle glaucoma, bilateral, severe stage: Secondary | ICD-10-CM | POA: Diagnosis not present

## 2019-10-31 ENCOUNTER — Telehealth: Payer: Self-pay | Admitting: Internal Medicine

## 2019-10-31 ENCOUNTER — Telehealth: Payer: Self-pay

## 2019-10-31 DIAGNOSIS — I739 Peripheral vascular disease, unspecified: Secondary | ICD-10-CM

## 2019-10-31 DIAGNOSIS — R6889 Other general symptoms and signs: Secondary | ICD-10-CM

## 2019-10-31 NOTE — Telephone Encounter (Signed)
[name], Tami   LN  10/31/19 3:09 PM Note Pt c/o medication issue:  1. Name of Medication: potassium chloride (KLOR-CON) 10 MEQ tablet   2. How are you currently taking this medication (dosage and times per day)?   Stopped taking medication few days prior to Oct. 5th 2021  3. Are you having a reaction (difficulty breathing--STAT)?  Yes  4. What is your medication issue?   Patient states foot swelling up with pain associated, also skin starting to peel on right foot. Went to foot doctor, they think circulation issue.  She states when she stopped taking medication pain went away but swelling remained

## 2019-10-31 NOTE — Telephone Encounter (Signed)
Returned call to pt.  Pt saw Dr. Larey Dresser and was told there was trouble with the circulation in her legs.    Pt was letting us know.  Encouraged Pt to continue potassium, advised potassium was not causing her leg swelling.  Call placed to Dr. Jaquita Folds office and spoke with Crystal.  Per Crystal Pt had ABI's and had R leg reading of 0.291 and left leg 0.78.  She is having further scans this Thursday.  Per Crystal the physicians scanning on Thursday will offer any procedures Pt may need.    Crystal will meet with Pt on Thursday and help clarify with Pt that current circulation problems are not related to her heart pumping function.  Will touch base with Pt on Friday and see if she is having procedure with Dr. Harriet Pho or if she wants a referral at [facility] for vascular care.

## 2019-10-31 NOTE — Telephone Encounter (Signed)
Pt c/o medication issue:  1. Name of Medication: potassium chloride (KLOR-CON) 10 MEQ tablet   2. How are you currently taking this medication (dosage and times per day)?   Stopped taking medication few days prior to Oct. 5th 2021  3. Are you having a reaction (difficulty breathing--STAT)?  Yes  4. What is your medication issue?   Patient states foot swelling up with pain associated, also skin starting to peel on right foot. Went to foot doctor, they think circulation issue.  She states when she stopped taking medication pain went away but swelling remained

## 2019-11-01 ENCOUNTER — Ambulatory Visit (INDEPENDENT_AMBULATORY_CARE_PROVIDER_SITE_OTHER): Payer: Medicare Other | Admitting: *Deleted

## 2019-11-01 ENCOUNTER — Other Ambulatory Visit: Payer: Self-pay

## 2019-11-01 DIAGNOSIS — Z952 Presence of prosthetic heart valve: Secondary | ICD-10-CM

## 2019-11-01 DIAGNOSIS — I482 Chronic atrial fibrillation, unspecified: Secondary | ICD-10-CM | POA: Diagnosis not present

## 2019-11-01 DIAGNOSIS — Z5181 Encounter for therapeutic drug level monitoring: Secondary | ICD-10-CM

## 2019-11-01 LAB — POCT INR: INR: 1.9 — AB (ref 2.0–3.0)

## 2019-11-01 NOTE — Patient Instructions (Addendum)
Description    Take 1 tablet today then continue taking Warfarin 1/2 tablet every day except 1 tablet on Mondays and Fridays. Recheck in 2 weeks. Call if placed on any new medications or if scheduled for any procedures [phone].

## 2019-11-03 DIAGNOSIS — I70203 Unspecified atherosclerosis of native arteries of extremities, bilateral legs: Secondary | ICD-10-CM | POA: Diagnosis not present

## 2019-11-03 DIAGNOSIS — I739 Peripheral vascular disease, unspecified: Secondary | ICD-10-CM | POA: Diagnosis not present

## 2019-11-03 DIAGNOSIS — I70223 Atherosclerosis of native arteries of extremities with rest pain, bilateral legs: Secondary | ICD-10-CM | POA: Diagnosis not present

## 2019-11-11 NOTE — Telephone Encounter (Signed)
Per Dr. Conni Slipper prefer Pt be seen by [facility] vascular surgeons d/t Pt high risk d/t mechanical valve.  Urgent referral placed.  Will contact Dr. Harriet Pho office on Monday to request additional records to fax with referral.

## 2019-11-11 NOTE — Telephone Encounter (Signed)
Patient is following up. She states she has updates for Dr. Lubertha Basque nurse regarding the blocked arteries in her legs. She states there is a doctor in [place] who requested to treat her, however, she would like to consult with Dr. [name] prior to doing so.

## 2019-11-13 ENCOUNTER — Other Ambulatory Visit: Payer: Self-pay | Admitting: Internal Medicine

## 2019-11-14 ENCOUNTER — Telehealth: Payer: Self-pay | Admitting: Internal Medicine

## 2019-11-14 NOTE — Telephone Encounter (Signed)
Ladonna Snide is calling from [facility] with questions about an ABI that was done for the patient. She is asking for the results and would like a call. Please call   Thank you!

## 2019-11-14 NOTE — Telephone Encounter (Signed)
Left detailed message advising paperwork with ABI's documentation was faxed within the last hour or 2.  Advised to call back if they did not receive the fax.

## 2019-11-14 NOTE — Telephone Encounter (Signed)
Received addl records from Dr. Jaquita Folds office and faxed to vascular surgeons.

## 2019-11-16 ENCOUNTER — Other Ambulatory Visit: Payer: Self-pay | Admitting: Family Medicine

## 2019-11-16 DIAGNOSIS — Z1231 Encounter for screening mammogram for malignant neoplasm of breast: Secondary | ICD-10-CM

## 2019-11-18 ENCOUNTER — Ambulatory Visit (INDEPENDENT_AMBULATORY_CARE_PROVIDER_SITE_OTHER): Payer: Medicare Other | Admitting: Pharmacist

## 2019-11-18 ENCOUNTER — Other Ambulatory Visit: Payer: Self-pay

## 2019-11-18 DIAGNOSIS — Z5181 Encounter for therapeutic drug level monitoring: Secondary | ICD-10-CM | POA: Diagnosis not present

## 2019-11-18 DIAGNOSIS — Z952 Presence of prosthetic heart valve: Secondary | ICD-10-CM | POA: Diagnosis not present

## 2019-11-18 DIAGNOSIS — I482 Chronic atrial fibrillation, unspecified: Secondary | ICD-10-CM | POA: Diagnosis not present

## 2019-11-18 LAB — POCT INR: INR: 2.5 (ref 2.0–3.0)

## 2019-11-18 NOTE — Patient Instructions (Signed)
Description   Continue taking Warfarin 1/2 tablet every day except 1 tablet on Mondays and Fridays.  Recheck in 4 weeks. Call if placed on any new medications or if scheduled for any procedures [phone].      

## 2019-12-19 ENCOUNTER — Telehealth: Payer: Self-pay | Admitting: Internal Medicine

## 2019-12-19 NOTE — Telephone Encounter (Signed)
New message:     Patient calling to see if it is ok for the booster shot.

## 2019-12-19 NOTE — Telephone Encounter (Signed)
Returned call to Pt.  Advised ok to get booster shot.

## 2019-12-23 ENCOUNTER — Ambulatory Visit: Payer: Medicare Other

## 2019-12-28 ENCOUNTER — Ambulatory Visit (INDEPENDENT_AMBULATORY_CARE_PROVIDER_SITE_OTHER): Payer: Medicare Other | Admitting: *Deleted

## 2019-12-28 ENCOUNTER — Other Ambulatory Visit: Payer: Self-pay

## 2019-12-28 DIAGNOSIS — Z952 Presence of prosthetic heart valve: Secondary | ICD-10-CM

## 2019-12-28 DIAGNOSIS — I482 Chronic atrial fibrillation, unspecified: Secondary | ICD-10-CM

## 2019-12-28 DIAGNOSIS — Z5181 Encounter for therapeutic drug level monitoring: Secondary | ICD-10-CM

## 2019-12-28 LAB — POCT INR: INR: 2.3 (ref 2.0–3.0)

## 2019-12-28 NOTE — Patient Instructions (Signed)
Description   Today take 1 tablet then continue taking Warfarin 1/2 tablet every day except 1 tablet on Mondays and Fridays. Recheck in 4 weeks. Call if placed on any new medications or if scheduled for any procedures [phone].

## 2020-01-19 ENCOUNTER — Other Ambulatory Visit: Payer: Self-pay

## 2020-01-19 ENCOUNTER — Ambulatory Visit (HOSPITAL_COMMUNITY)
Admission: EM | Admit: 2020-01-19 | Discharge: 2020-01-19 | Disposition: A | Discharge status: Home / Self Care | Payer: Medicare Other

## 2020-01-19 DIAGNOSIS — H401133 Primary open-angle glaucoma, bilateral, severe stage: Secondary | ICD-10-CM | POA: Diagnosis not present

## 2020-01-24 ENCOUNTER — Other Ambulatory Visit: Payer: Self-pay

## 2020-01-24 ENCOUNTER — Encounter: Payer: Self-pay | Admitting: Vascular Surgery

## 2020-01-24 ENCOUNTER — Ambulatory Visit (INDEPENDENT_AMBULATORY_CARE_PROVIDER_SITE_OTHER): Payer: Medicare Other | Admitting: Vascular Surgery

## 2020-01-24 DIAGNOSIS — M79606 Pain in leg, unspecified: Secondary | ICD-10-CM | POA: Insufficient documentation

## 2020-01-24 DIAGNOSIS — M79605 Pain in left leg: Secondary | ICD-10-CM

## 2020-01-24 DIAGNOSIS — M79604 Pain in right leg: Secondary | ICD-10-CM

## 2020-01-24 NOTE — Progress Notes (Signed)
[name] MRN: [id number] DOB: 02/12/1935 Sex: female  REASON FOR CONSULT: PVD/Claudication/venous disease/leg pain  HPI: [name] is a 85 y.o. female, with history of atrial fibrillation, aortic stenosis, diastolic heart failure that presents for evaluation of lower extremity swelling and pain and concern for arterial and venous disease.  She describes feeling of tightness in her lower extremities and generalized discomfort.  She has noticed a lot of prominent veins in her lower extremities.  She denies any previous DVTs or other venous interventions.  She has been seen by her podiatrist who also felt her leg pain may be related to arterial and/or venous disease.  She had lower extremity Doppler studies done on 11/17/19 that showed monophasic flow from the right popliteal artery distal in the right leg and biphasic flow down the left leg.  Her ABIs were reported as 0.91 on the right and 0.78 on the left.  She does walk with a cane.  Past Medical History:  Diagnosis Date  . Asthma    years ago  . Chronic atrial fibrillation (HCC)   . Dilated cardiomyopathy (HCC)    history of this, now resolved  . Dysphagia    Hx  . Fracture of tibial plateau 05/21/2015  . GERD (gastroesophageal reflux disease)   . GI bleed   . Hemorrhoid 04/2014   bleeding  . History of blood transfusion   . Hypertension   . Microcytic anemia   . Nonsustained ventricular tachycardia (HCC)   . Osteoporosis   . Protein in urine   . RBBB (right bundle branch block)   . Rheumatic mitral valve disease     Past Surgical History:  Procedure Laterality Date  . CHOLECYSTECTOMY  2005  . COLONOSCOPY    . COLONOSCOPY W/ POLYPECTOMY    . EYE SURGERY  04/2014  . HEMICOLECTOMY  2008   forpolyps  . MITOMYCIN C APPLICATION Right 05/17/2014   Procedure: MITOMYCIN C APPLICATION;  Surgeon: Chalmers Guest, MD;  Location: [facility] OR;  Service: Ophthalmology;  Laterality: Right;  . MITRAL VALVE REPLACEMENT   1987   St Jude mechanical valve  . TRABECULECTOMY Right 05/17/2014   Procedure: TRABECULECTOMY WITH MITOMYCIN RIGHT EYE;  Surgeon: Chalmers Guest, MD;  Location[facility] OR;  Service: Ophthalmology;  Laterality: Right;  . TUBAL LIGATION    . US ECHOCARDIOGRAPHY  02/2008, 03/2006, 06/2004    Family History  Problem Relation Age of Onset  . Cancer Father        prostate cancer  . Hypertension Mother   . Stroke Mother        CVA x 2  . Cancer Sister        in the Bladder  . Endometriosis Daughter   . Stroke Sister   . Blindness Sister   . Hypertension Sister   . Thyroid disease Daughter   . Hypertension Daughter   . Hypertension Daughter   . Hypertension Daughter   . Liver disease Daughter   . Colon cancer Neg Hx   . Breast cancer Neg Hx     SOCIAL HISTORY: Social History   Socioeconomic History  . Marital status: Divorced    Spouse name: Not on file  . Number of children: 5  . Years of education: 37  . Highest education level: High school graduate  Occupational History  . Occupation: Retired  Tobacco Use  . Smoking status: Former Smoker    Packs/day: 1.50    Years: 15.00  Pack years: 22.50    Quit date: 84    Years since quitting: 35.0  . Smokeless tobacco: Never Used  . Tobacco comment: quit 1987  Vaping Use  . Vaping Use: Never used  Substance and Sexual Activity  . Alcohol use: No  . Drug use: No  . Sexual activity: Not Currently  Other Topics Concern  . Not on file  Social History Narrative   Patient lives alone in [place].   Patient has 3 children who live near her and offer support. 2 live up [place].   Patient is active in [place] and enjoys walking for exercise.    Social Determinants of Health   Financial Resource Strain: Not on file  Food Insecurity: Not on file  Transportation Needs: Not on file  Physical Activity: Not on file  Stress: Not on file  Social Connections: Not on file  Intimate Partner Violence: Not on file    Allergies   Allergen Reactions  . Esomeprazole Magnesium     REACTION: stomach upset, nausea    Current Outpatient Medications  Medication Sig Dispense Refill  . Acetaminophen 500 MG coapsule Take 650 mg by mouth every 6 (six) hours as needed for fever.    . Cholecalciferol (VITAMIN D) 2000 units tablet Take 1 tablet (2,000 Units total) by mouth daily. 100 tablet 3  . digoxin (LANOXIN) 0.125 MG tablet Take 1 tablet (0.125 mg total) by mouth every other day. Please keep upcoming appt in June with Dr. [name] before anymore refills. Thank you 45 tablet 3  . diltiazem (CARDIZEM CD) 240 MG 24 hr capsule Take 1 capsule (240 mg total) by mouth daily. 90 capsule 3  . dorzolamidel-timolol (COSOPT) 22.3-6.8 MG/ML SOLN ophthalmic solution INT 1 GTT IN OU BID  4  . hydrochlorothiazide (HYDRODIURIL) 25 MG tablet Take 1 tablet (25 mg total) by mouth daily. 90 tablet 3  . LUMIGAN 0.01 % SOLN INT 1 GTT INTO OU QHS  0  . pantoprazole (PROTONIX) 40 MG tablet Take 1 tablet (40 mg total) by mouth daily. 90 tablet 3  . warfarin (COUMADIN) 10 MG tablet TAKE 1/2 TO 1 TABLET BY MOUTH AS DIRECTED BY COUMADIN CLINIC 80 tablet 1  . metoprolol tartrate (LOPRESSOR) 50 MG tablet Take 1.5 tablets (75 mg total) by mouth 2 (two) times daily. 270 tablet 3  . polyethylene glycol (MIRALAX / GLYCOLAX) packet Take 17 g by mouth 2 (two) times daily. (Patient not taking: Reported on 01/24/2020) 14 each 0  . potassium chloride (KLOR-CON) 10 MEQ tablet Take 2 tablets (20 mEq total) by mouth daily. 180 tablet 3   No current facility-administered medications for this visit.    REVIEW OF SYSTEMS:  [X]  denotes positive finding, [ ]  denotes negative finding Cardiac  Comments:  Chest pain or chest pressure:    Shortness of breath upon exertion:    Short of breath when lying flat:    Irregular heart rhythm:        Vascular    Pain in calf, thigh, or hip brought on by ambulation:    Pain in feet at night that wakes you up from your sleep:      Blood clot in your veins:    Leg swelling:  x       Pulmonary    Oxygen at home:    Productive cough:     Wheezing:         Neurologic    Sudden weakness in arms or legs:  Sudden numbness in arms or legs:     Sudden onset of difficulty speaking or slurred speech:    Temporary loss of vision in one eye:     Problems with dizziness:  x       Gastrointestinal    Blood in stool:     Vomited blood:         Genitourinary    Burning when urinating:     Blood in urine:        Psychiatric    Major depression:         Hematologic    Bleeding problems:    Problems with blood clotting too easily:        Skin    Rashes or ulcers:        Constitutional    Fever or chills:      PHYSICAL EXAM: Vitals:   01/24/20 1219 01/24/20 1229 01/24/20 1231  BP: (!) 223/132 (!) 203/113 (!) 200/107  Pulse: 91 91 91  Resp: 14    Temp: 97.8 F (36.6 C)    TempSrc: Temporal    SpO2: 96%    Weight: 170 lb (77.1 kg)    Height: 5' 7.5" (1.715 m)      GENERAL: The patient is a well-nourished female, in no acute distress. The vital signs are documented above. CARDIAC: There is a regular rate and rhythm.  VASCULAR:  Palpable femoral pulses bilaterally Palpable dorsalis pedis pulses bilaterally PULMONARY: There is good air exchange bilaterally without wheezing or rales. ABDOMEN: Soft and non-tender. MUSCULOSKELETAL: There are no major deformities or cyanosis. NEUROLOGIC: No focal weakness or paresthesias are detected. SKIN: There are no ulcers or rashes noted. PSYCHIATRIC: The patient has a normal affect.  DATA:   No new studies today  Assessment/Plan:  85 year old female presents for evaluation of lower extremity pain,swelling etc with concern for arterial and/or venous disease.  I reviewed her noninvasive imaging from [facility] and she has an ABI of 0.91 on the right and 0.78 on the left.  On exam she has palpable dorsalis pedis pulses bilaterally.  Given palpable pulses on exam I  do not think she has any significant arterial disease to explain her symptoms and certainly no evidence of critical limb ischemia.  She does complain of a fair amount of swelling and tightness in her lower extremities and has pretty prominent varicosities and spider veins.  I have recommended bilateral lower extremity venous reflux study to rule out venous insufficiency and will have her follow-up after this is complete.  Given her history of diastolic heart failure certainly could be more centralized process but will evaluated venous etiology before recommending aggressive compression therapy, etc..   Marty Heck, MD [facility] Office: [phone]

## 2020-01-25 ENCOUNTER — Ambulatory Visit (INDEPENDENT_AMBULATORY_CARE_PROVIDER_SITE_OTHER): Payer: Medicare Other | Admitting: *Deleted

## 2020-01-25 ENCOUNTER — Other Ambulatory Visit: Payer: Self-pay

## 2020-01-25 DIAGNOSIS — M79604 Pain in right leg: Secondary | ICD-10-CM

## 2020-01-25 DIAGNOSIS — Z952 Presence of prosthetic heart valve: Secondary | ICD-10-CM

## 2020-01-25 DIAGNOSIS — I482 Chronic atrial fibrillation, unspecified: Secondary | ICD-10-CM | POA: Diagnosis not present

## 2020-01-25 DIAGNOSIS — Z5181 Encounter for therapeutic drug level monitoring: Secondary | ICD-10-CM | POA: Diagnosis not present

## 2020-01-25 LAB — POCT INR: INR: 2.9 (ref 2.0–3.0)

## 2020-01-25 NOTE — Patient Instructions (Signed)
Description   Continue taking Warfarin 1/2 tablet every day except 1 tablet on Mondays and Fridays.  Recheck in 4 weeks. Call if placed on any new medications or if scheduled for any procedures [phone].      

## 2020-02-17 ENCOUNTER — Ambulatory Visit: Payer: Medicare Other

## 2020-02-17 ENCOUNTER — Encounter (HOSPITAL_COMMUNITY): Payer: Medicare Other

## 2020-02-22 ENCOUNTER — Other Ambulatory Visit: Payer: Self-pay

## 2020-02-22 ENCOUNTER — Ambulatory Visit (INDEPENDENT_AMBULATORY_CARE_PROVIDER_SITE_OTHER): Payer: Medicare Other | Admitting: *Deleted

## 2020-02-22 ENCOUNTER — Other Ambulatory Visit: Payer: Self-pay | Admitting: Gastroenterology

## 2020-02-22 DIAGNOSIS — Z5181 Encounter for therapeutic drug level monitoring: Secondary | ICD-10-CM | POA: Diagnosis not present

## 2020-02-22 DIAGNOSIS — Z952 Presence of prosthetic heart valve: Secondary | ICD-10-CM

## 2020-02-22 DIAGNOSIS — K7469 Other cirrhosis of liver: Secondary | ICD-10-CM

## 2020-02-22 DIAGNOSIS — I482 Chronic atrial fibrillation, unspecified: Secondary | ICD-10-CM

## 2020-02-22 LAB — POCT INR: INR: 3.9 — AB (ref 2.0–3.0)

## 2020-02-22 NOTE — Patient Instructions (Signed)
Description   Do not take any Warfarin today then continue taking Warfarin 1/2 tablet every day except 1 tablet on Mondays and Fridays. Recheck in 3 weeks. Call if placed on any new medications or if scheduled for any procedures [phone].

## 2020-03-07 ENCOUNTER — Ambulatory Visit
Admission: RE | Admit: 2020-03-07 | Discharge: 2020-03-07 | Disposition: A | Discharge status: Home / Self Care | Payer: Medicare Other | Source: Ambulatory Visit | Attending: Gastroenterology | Admitting: Gastroenterology

## 2020-03-07 DIAGNOSIS — K7469 Other cirrhosis of liver: Secondary | ICD-10-CM

## 2020-03-07 DIAGNOSIS — K7460 Unspecified cirrhosis of liver: Secondary | ICD-10-CM | POA: Diagnosis not present

## 2020-03-19 ENCOUNTER — Ambulatory Visit (INDEPENDENT_AMBULATORY_CARE_PROVIDER_SITE_OTHER): Payer: Medicare Other

## 2020-03-19 ENCOUNTER — Other Ambulatory Visit: Payer: Self-pay

## 2020-03-19 DIAGNOSIS — Z5181 Encounter for therapeutic drug level monitoring: Secondary | ICD-10-CM

## 2020-03-19 DIAGNOSIS — I482 Chronic atrial fibrillation, unspecified: Secondary | ICD-10-CM

## 2020-03-19 DIAGNOSIS — Z952 Presence of prosthetic heart valve: Secondary | ICD-10-CM | POA: Diagnosis not present

## 2020-03-19 LAB — POCT INR: INR: 3.1 — AB (ref 2.0–3.0)

## 2020-03-19 NOTE — Patient Instructions (Signed)
-   continue taking Warfarin 1/2 tablet every day except 1 tablet on Mondays and Fridays.  - Recheck in 4 weeks.  Call if placed on any new medications or if scheduled for any procedures [phone].

## 2020-03-30 ENCOUNTER — Other Ambulatory Visit: Payer: Self-pay | Admitting: *Deleted

## 2020-03-30 DIAGNOSIS — M79604 Pain in right leg: Secondary | ICD-10-CM

## 2020-03-30 DIAGNOSIS — M79605 Pain in left leg: Secondary | ICD-10-CM

## 2020-04-05 ENCOUNTER — Encounter (HOSPITAL_COMMUNITY): Payer: Medicare Other

## 2020-04-05 ENCOUNTER — Ambulatory Visit: Payer: Medicare Other

## 2020-04-11 ENCOUNTER — Ambulatory Visit: Payer: Medicare Other | Admitting: Family Medicine

## 2020-04-17 ENCOUNTER — Other Ambulatory Visit: Payer: Self-pay

## 2020-04-17 ENCOUNTER — Ambulatory Visit (INDEPENDENT_AMBULATORY_CARE_PROVIDER_SITE_OTHER): Payer: Medicare Other

## 2020-04-17 DIAGNOSIS — Z5181 Encounter for therapeutic drug level monitoring: Secondary | ICD-10-CM | POA: Diagnosis not present

## 2020-04-17 DIAGNOSIS — Z952 Presence of prosthetic heart valve: Secondary | ICD-10-CM

## 2020-04-17 DIAGNOSIS — I482 Chronic atrial fibrillation, unspecified: Secondary | ICD-10-CM

## 2020-04-17 LAB — POCT INR: INR: 2.7 (ref 2.0–3.0)

## 2020-04-17 NOTE — Patient Instructions (Signed)
Description   Continue taking Warfarin 1/2 tablet every day except 1 tablet on Mondays and Fridays.  Recheck in 4 weeks. Call if placed on any new medications or if scheduled for any procedures [phone].

## 2020-04-20 DIAGNOSIS — H401133 Primary open-angle glaucoma, bilateral, severe stage: Secondary | ICD-10-CM | POA: Diagnosis not present

## 2020-04-25 DIAGNOSIS — K219 Gastro-esophageal reflux disease without esophagitis: Secondary | ICD-10-CM | POA: Insufficient documentation

## 2020-04-25 DIAGNOSIS — R197 Diarrhea, unspecified: Secondary | ICD-10-CM | POA: Insufficient documentation

## 2020-04-25 DIAGNOSIS — D649 Anemia, unspecified: Secondary | ICD-10-CM | POA: Insufficient documentation

## 2020-04-25 DIAGNOSIS — I509 Heart failure, unspecified: Secondary | ICD-10-CM | POA: Insufficient documentation

## 2020-04-25 DIAGNOSIS — N3281 Overactive bladder: Secondary | ICD-10-CM | POA: Insufficient documentation

## 2020-04-25 DIAGNOSIS — Z860100 Personal history of colon polyps, unspecified: Secondary | ICD-10-CM | POA: Insufficient documentation

## 2020-04-25 DIAGNOSIS — K635 Polyp of colon: Secondary | ICD-10-CM | POA: Insufficient documentation

## 2020-04-25 DIAGNOSIS — R198 Other specified symptoms and signs involving the digestive system and abdomen: Secondary | ICD-10-CM | POA: Insufficient documentation

## 2020-04-25 DIAGNOSIS — K629 Disease of anus and rectum, unspecified: Secondary | ICD-10-CM | POA: Insufficient documentation

## 2020-04-25 DIAGNOSIS — K921 Melena: Secondary | ICD-10-CM | POA: Insufficient documentation

## 2020-04-25 DIAGNOSIS — M542 Cervicalgia: Secondary | ICD-10-CM | POA: Insufficient documentation

## 2020-04-25 DIAGNOSIS — R531 Weakness: Secondary | ICD-10-CM | POA: Insufficient documentation

## 2020-04-25 DIAGNOSIS — R12 Heartburn: Secondary | ICD-10-CM | POA: Insufficient documentation

## 2020-04-25 DIAGNOSIS — M4800 Spinal stenosis, site unspecified: Secondary | ICD-10-CM | POA: Insufficient documentation

## 2020-04-25 DIAGNOSIS — K5730 Diverticulosis of large intestine without perforation or abscess without bleeding: Secondary | ICD-10-CM | POA: Insufficient documentation

## 2020-04-25 DIAGNOSIS — R140 Abdominal distension (gaseous): Secondary | ICD-10-CM | POA: Insufficient documentation

## 2020-04-27 ENCOUNTER — Ambulatory Visit (HOSPITAL_COMMUNITY): Payer: Medicare Other | Attending: Vascular Surgery

## 2020-04-27 ENCOUNTER — Ambulatory Visit: Payer: Medicare Other

## 2020-05-01 ENCOUNTER — Other Ambulatory Visit: Payer: Self-pay | Admitting: Internal Medicine

## 2020-05-02 ENCOUNTER — Encounter: Payer: Self-pay | Admitting: Family Medicine

## 2020-05-02 ENCOUNTER — Ambulatory Visit (INDEPENDENT_AMBULATORY_CARE_PROVIDER_SITE_OTHER): Payer: Medicare Other | Admitting: Family Medicine

## 2020-05-02 ENCOUNTER — Other Ambulatory Visit: Payer: Self-pay

## 2020-05-02 VITALS — BP 178/94 | HR 98 | Ht 68.0 in | Wt 167.0 lb

## 2020-05-02 DIAGNOSIS — I10 Essential (primary) hypertension: Secondary | ICD-10-CM | POA: Diagnosis not present

## 2020-05-02 NOTE — Progress Notes (Signed)
    CHIEF COMPLAINT / HPI: F/u HTN. Did not take all of her BP meds this AM (HCTZ). It makes her pee too much. No problems with meds other than that and taking normally.    PERTINENT  PMH / PSH: I have reviewed the patient's medications, allergies, past medical and surgical history, smoking status and updated in the EMR as appropriate.   OBJECTIVE:  BP (!) 178/94   Pulse 98   Ht 5\' 8"  (1.727 m)   Wt 167 lb (75.8 kg)   SpO2 99%   BMI 25.39 kg/m  Vital signs reviewed. GENERAL: Well-developed, well-nourished, no acute distress. CARDIOVASCULAR: Regular rate and rhythm no murmur gallop or rub LUNGS: Clear to auscultation bilaterally, no rales or wheeze. ABDOMEN: Soft positive bowel sounds NEURO: No gross focal neurological deficits. MSK: Movement of extremity x 4.    ASSESSMENT / PLAN:   HYPERTENSION, BENIGN SYSTEMIC Did not take aoo of her BP meds so I am not able to tell if she has control or bnot. RTC 3 m and take meds I asked her to check some BP readings in interim and mail to me Labs today   MD

## 2020-05-02 NOTE — Patient Instructions (Signed)
Let me see you in 3 months!  I will send you a note about your blood work.

## 2020-05-02 NOTE — Assessment & Plan Note (Addendum)
Did not take aoo of her BP meds so I am not able to tell if she has control or bnot. RTC 3 m and take meds I asked her to check some BP readings in interim and mail to me Labs today

## 2020-05-03 LAB — BASIC METABOLIC PANEL
BUN/Creatinine Ratio: 16 (ref 12–28)
BUN: 24 mg/dL (ref 8–27)
CO2: 20 mmol/L (ref 20–29)
Calcium: 10 mg/dL (ref 8.7–10.3)
Chloride: 98 mmol/L (ref 96–106)
Creatinine, Ser: 1.49 mg/dL — ABNORMAL HIGH (ref 0.57–1.00)
Glucose: 52 mg/dL — ABNORMAL LOW (ref 65–99)
Potassium: 3.9 mmol/L (ref 3.5–5.2)
Sodium: 142 mmol/L (ref 134–144)
eGFR: 35 mL/min/{1.73_m2} — ABNORMAL LOW (ref >59)

## 2020-05-07 ENCOUNTER — Encounter: Payer: Self-pay | Admitting: Family Medicine

## 2020-05-15 ENCOUNTER — Other Ambulatory Visit: Payer: Self-pay

## 2020-05-15 ENCOUNTER — Ambulatory Visit (INDEPENDENT_AMBULATORY_CARE_PROVIDER_SITE_OTHER): Payer: Medicare Other

## 2020-05-15 DIAGNOSIS — I482 Chronic atrial fibrillation, unspecified: Secondary | ICD-10-CM | POA: Diagnosis not present

## 2020-05-15 DIAGNOSIS — Z952 Presence of prosthetic heart valve: Secondary | ICD-10-CM | POA: Diagnosis not present

## 2020-05-15 DIAGNOSIS — Z5181 Encounter for therapeutic drug level monitoring: Secondary | ICD-10-CM

## 2020-05-15 LAB — POCT INR: INR: 2.2 (ref 2.0–3.0)

## 2020-05-15 NOTE — Patient Instructions (Signed)
Description   Take 1 tablet today, then resume same dosage of Warfarin 1/2 tablet every day except 1 tablet on Mondays and Fridays.  Recheck in 4 weeks. Call if placed on any new medications or if scheduled for any procedures [phone].

## 2020-05-23 ENCOUNTER — Other Ambulatory Visit: Payer: Self-pay

## 2020-05-23 ENCOUNTER — Ambulatory Visit (HOSPITAL_COMMUNITY)
Admission: RE | Admit: 2020-05-23 | Discharge: 2020-05-23 | Disposition: A | Discharge status: Home / Self Care | Payer: Medicare Other | Source: Ambulatory Visit | Attending: Vascular Surgery | Admitting: Vascular Surgery

## 2020-05-23 ENCOUNTER — Ambulatory Visit (INDEPENDENT_AMBULATORY_CARE_PROVIDER_SITE_OTHER): Payer: Medicare Other | Admitting: Physician Assistant

## 2020-05-23 VITALS — BP 172/95 | HR 68 | Temp 97.9°F | Resp 20 | Ht 68.0 in | Wt 166.9 lb

## 2020-05-23 DIAGNOSIS — M79604 Pain in right leg: Secondary | ICD-10-CM

## 2020-05-23 DIAGNOSIS — M79605 Pain in left leg: Secondary | ICD-10-CM | POA: Insufficient documentation

## 2020-05-23 DIAGNOSIS — I872 Venous insufficiency (chronic) (peripheral): Secondary | ICD-10-CM | POA: Diagnosis not present

## 2020-05-23 DIAGNOSIS — M7989 Other specified soft tissue disorders: Secondary | ICD-10-CM

## 2020-05-23 NOTE — Progress Notes (Signed)
Office Note     CC:  follow up Requesting Provider:  Nestor Ramp, MD  HPI: [name] is a 85 y.o. (Sep 14, 1935) female who presents for evaluation of bilateral lower extremity edema.  She was seen recently by Dr. Chestine Spore in January of this year for evaluation of arterial circulation.  She had palpable DP pulses bilaterally at the time.  Plan was for her to her return to clinic with venous reflux study to evaluate for possible venous insufficiency causing bilateral lower extremity discomfort.  The patient primarily complains of burning pain in her toes and feet.  She is not bothered much by her edema of bilateral lower extremities.  She does have pigmentation changes of bilateral lower extremities.  She does not wear compression or make an attempt to elevate her legs during the day.  She also has a cardiologist for management of heart failure as well as Coumadin for mechanical heart valve.  She does not take a diuretic other than hydrochlorothiazide.  Past Medical History:  Diagnosis Date  . Asthma    years ago  . Chronic atrial fibrillation (HCC)   . Dilated cardiomyopathy (HCC)    history of this, now resolved  . Dysphagia    Hx  . Fracture of tibial plateau 05/21/2015  . GERD (gastroesophageal reflux disease)   . GI bleed   . Hemorrhoid 04/2014   bleeding  . History of blood transfusion   . Hypertension   . Microcytic anemia   . Nonsustained ventricular tachycardia (HCC)   . Osteoporosis   . Protein in urine   . RBBB (right bundle branch block)   . Rheumatic mitral valve disease     Past Surgical History:  Procedure Laterality Date  . CHOLECYSTECTOMY  2005  . COLONOSCOPY    . COLONOSCOPY W/ POLYPECTOMY    . EYE SURGERY  04/2014  . HEMICOLECTOMY  2008   forpolyps  . MITOMYCIN C APPLICATION Right 05/17/2014   Procedure: MITOMYCIN C APPLICATION;  Surgeon: Chalmers Guest, MD;  Location: [facility] OR;  Service: Ophthalmology;  Laterality: Right;  . MITRAL VALVE REPLACEMENT  1987    St Jude mechanical valve  . TRABECULECTOMY Right 05/17/2014   Procedure: TRABECULECTOMY WITH MITOMYCIN RIGHT EYE;  Surgeon: Chalmers Guest, MD;  Location: [facility] OR;  Service: Ophthalmology;  Laterality: Right;  . TUBAL LIGATION    . US ECHOCARDIOGRAPHY  02/2008, 03/2006, 06/2004    Social History   Socioeconomic History  . Marital status: Divorced    Spouse name: Not on file  . Number of children: 5  . Years of education: 67  . Highest education level: High school graduate  Occupational History  . Occupation: Retired  Tobacco Use  . Smoking status: Former Smoker    Packs/day: 1.50    Years: 15.00    Pack years: 22.50    Quit date: 1987    Years since quitting: 35.3  . Smokeless tobacco: Never Used  . Tobacco comment: quit 1987  Vaping Use  . Vaping Use: Never used  Substance and Sexual Activity  . Alcohol use: No  . Drug use: No  . Sexual activity: Not Currently  Other Topics Concern  . Not on file  Social History Narrative   Patient lives alone in [place].   Patient has 3 children who live near her and offer support. 2 live up [place].   Patient is active in [place] and enjoys walking for exercise.    Social Determinants of Health  Financial Resource Strain: Not on file  Food Insecurity: Not on file  Transportation Needs: Not on file  Physical Activity: Not on file  Stress: Not on file  Social Connections: Not on file  Intimate Partner Violence: Not on file    Family History  Problem Relation Age of Onset  . Cancer Father        prostate cancer  . Hypertension Mother   . Stroke Mother        CVA x 2  . Cancer Sister        in the Bladder  . Endometriosis Daughter   . Stroke Sister   . Blindness Sister   . Hypertension Sister   . Thyroid disease Daughter   . Hypertension Daughter   . Hypertension Daughter   . Hypertension Daughter   . Liver disease Daughter   . Colon cancer Neg Hx   . Breast cancer Neg Hx     Current Outpatient Medications  Medication  Sig Dispense Refill  . Acetaminophen 500 MG coapsule Take 650 mg by mouth every 6 (six) hours as needed for fever.    . Cholecalciferol (VITAMIN D) 2000 units tablet Take 1 tablet (2,000 Units total) by mouth daily. 100 tablet 3  . digoxin (LANOXIN) 0.125 MG tablet Take 1 tablet (0.125 mg total) by mouth every other day. Please keep upcoming appt in June with Dr. [name] before anymore refills. Thank you 45 tablet 3  . diltiazem (CARDIZEM CD) 240 MG 24 hr capsule Take 1 capsule (240 mg total) by mouth daily. 90 capsule 3  . dorzolamidel-timolol (COSOPT) 22.3-6.8 MG/ML SOLN ophthalmic solution INT 1 GTT IN OU BID  4  . hydrochlorothiazide (HYDRODIURIL) 25 MG tablet Take 1 tablet (25 mg total) by mouth daily. 90 tablet 3  . LUMIGAN 0.01 % SOLN INT 1 GTT INTO OU QHS  0  . pantoprazole (PROTONIX) 40 MG tablet Take 1 tablet (40 mg total) by mouth daily. 90 tablet 3  . polyethylene glycol (MIRALAX / GLYCOLAX) packet Take 17 g by mouth 2 (two) times daily. 14 each 0  . warfarin (COUMADIN) 10 MG tablet TAKE 1/2 TO 1 TABLET BY MOUTH AS DIRECTED BY COUMADIN CLINIC 60 tablet 1  . metoprolol tartrate (LOPRESSOR) 50 MG tablet Take 1.5 tablets (75 mg total) by mouth 2 (two) times daily. 270 tablet 3  . potassium chloride (KLOR-CON) 10 MEQ tablet Take 2 tablets (20 mEq total) by mouth daily. 180 tablet 3   No current facility-administered medications for this visit.    Allergies  Allergen Reactions  . Esomeprazole Magnesium     REACTION: stomach upset, nausea     REVIEW OF SYSTEMS:   [X]  denotes positive finding, [ ]  denotes negative finding Cardiac  Comments:  Chest pain or chest pressure:    Shortness of breath upon exertion:    Short of breath when lying flat:    Irregular heart rhythm:        Vascular    Pain in calf, thigh, or hip brought on by ambulation:    Pain in feet at night that wakes you up from your sleep:     Blood clot in your veins:    Leg swelling:         Pulmonary     Oxygen at home:    Productive cough:     Wheezing:         Neurologic    Sudden weakness in arms or legs:  Sudden numbness in arms or legs:     Sudden onset of difficulty speaking or slurred speech:    Temporary loss of vision in one eye:     Problems with dizziness:         Gastrointestinal    Blood in stool:     Vomited blood:         Genitourinary    Burning when urinating:     Blood in urine:        Psychiatric    Major depression:         Hematologic    Bleeding problems:    Problems with blood clotting too easily:        Skin    Rashes or ulcers:        Constitutional    Fever or chills:      PHYSICAL EXAMINATION:  Vitals:   05/23/20 1331  BP: (!) 172/95  Pulse: 68  Resp: 20  Temp: 97.9 F (36.6 C)  TempSrc: Temporal  SpO2: 100%  Weight: 166 lb 14.4 oz (75.7 kg)  Height: 5\' 8"  (1.727 m)    General:  WDWN in NAD; vital signs documented above Gait: Not observed HENT: WNL, normocephalic Pulmonary: normal non-labored breathing Cardiac: regular HR Abdomen: soft, NT, no masses Skin: without rashes Vascular Exam/Pulses:  Right Left  Radial 2+ (normal) 2+ (normal)  DP 1+ (weak) 2+ (normal)   Extremities: Palpable DP pulses left stronger than right; scattered spider veins of bilateral lower extremities; pigmentation changes of bilateral shins left worse than right; no venous ulcerations Musculoskeletal: no muscle wasting or atrophy  Neurologic: A&O X 3;  No focal weakness or paresthesias are detected Psychiatric:  The pt has Normal affect.   Non-Invasive Vascular Imaging:   Venous reflux study negative for DVT  Right greater saphenous vein incompetent at the saphenofemoral junction and the proximal thigh however diameter is relatively small Right common femoral vein incompetent  Left greater saphenous vein incompetent at the saphenofemoral junction and proximal thigh however diameter is relatively small Negative for deep venous reflux in left  lower extremity    ASSESSMENT/PLAN:: 85 y.o. female here for follow up for work-up of bilateral lower extremity edema  -Venous reflux study does demonstrate incompetence of bilateral greater saphenous veins however only in the saphenofemoral junction and proximal thigh; study was negative for DVT or any significant deep venous reflux -Recommendations included knee-high 15 to 20 mmHg compression, periodic elevation of legs during the day, and avoiding prolonged sitting and standing -As previously discussed by Dr. 97, patient has palpable DP pulses and thus arterial insufficiency is not an etiology for her bilateral foot and toe burning sensation -Symmetrical burning sensation in the  Feet is not a classic presentation for venous insufficiency however we will manage her lower extremity edema to see if this helps her with her discomfort; patient states she also has follow-up with her cardiologist for routine management of heart failure -Nothing further to add from a vascular surgery standpoint; if managing venous stasis symptoms does not help with any discomfort she will need to follow-up with her PCP to explore other possible etiology   Chestine Spore, PA-C [facility] [phone]  Clinic MD:   [phone]

## 2020-06-02 ENCOUNTER — Other Ambulatory Visit: Payer: Self-pay | Admitting: Family Medicine

## 2020-06-12 ENCOUNTER — Ambulatory Visit (INDEPENDENT_AMBULATORY_CARE_PROVIDER_SITE_OTHER): Payer: Medicare Other | Admitting: *Deleted

## 2020-06-12 ENCOUNTER — Other Ambulatory Visit: Payer: Self-pay

## 2020-06-12 DIAGNOSIS — Z5181 Encounter for therapeutic drug level monitoring: Secondary | ICD-10-CM | POA: Diagnosis not present

## 2020-06-12 DIAGNOSIS — I482 Chronic atrial fibrillation, unspecified: Secondary | ICD-10-CM

## 2020-06-12 DIAGNOSIS — Z952 Presence of prosthetic heart valve: Secondary | ICD-10-CM | POA: Diagnosis not present

## 2020-06-12 LAB — POCT INR: INR: 2.3 (ref 2.0–3.0)

## 2020-06-12 NOTE — Patient Instructions (Signed)
Description   Start taking Warfarin 1/2 tablet everyday except 1 tablet on Mondays, Wednesdays, and Fridays.  Recheck in 3 weeks. Call if placed on any new medications or if scheduled for any procedures [phone].

## 2020-07-02 ENCOUNTER — Other Ambulatory Visit: Payer: Self-pay

## 2020-07-02 ENCOUNTER — Ambulatory Visit (INDEPENDENT_AMBULATORY_CARE_PROVIDER_SITE_OTHER): Payer: Medicare Other

## 2020-07-02 DIAGNOSIS — I482 Chronic atrial fibrillation, unspecified: Secondary | ICD-10-CM

## 2020-07-02 DIAGNOSIS — Z952 Presence of prosthetic heart valve: Secondary | ICD-10-CM

## 2020-07-02 DIAGNOSIS — Z5181 Encounter for therapeutic drug level monitoring: Secondary | ICD-10-CM | POA: Diagnosis not present

## 2020-07-02 LAB — POCT INR: INR: 4.1 — AB (ref 2.0–3.0)

## 2020-07-02 NOTE — Patient Instructions (Signed)
-   skip wafarin tonight, then - continue Warfarin 1/2 tablet everyday except 1 tablet on Mondays, Wednesdays, and Fridays.   - Recheck in 3 weeks.  Call if placed on any new medications or if scheduled for any procedures [phone].

## 2020-07-20 DIAGNOSIS — H401133 Primary open-angle glaucoma, bilateral, severe stage: Secondary | ICD-10-CM | POA: Diagnosis not present

## 2020-07-24 ENCOUNTER — Other Ambulatory Visit: Payer: Self-pay

## 2020-07-24 ENCOUNTER — Ambulatory Visit (INDEPENDENT_AMBULATORY_CARE_PROVIDER_SITE_OTHER): Payer: Medicare Other | Admitting: Pharmacist

## 2020-07-24 DIAGNOSIS — Z5181 Encounter for therapeutic drug level monitoring: Secondary | ICD-10-CM | POA: Diagnosis not present

## 2020-07-24 DIAGNOSIS — Z952 Presence of prosthetic heart valve: Secondary | ICD-10-CM | POA: Diagnosis not present

## 2020-07-24 DIAGNOSIS — I482 Chronic atrial fibrillation, unspecified: Secondary | ICD-10-CM

## 2020-07-24 LAB — POCT INR: INR: 3.2 — AB (ref 2.0–3.0)

## 2020-07-24 NOTE — Patient Instructions (Signed)
Description   - continue Warfarin 1/2 tablet every day except 1 tablet on Mondays, Wednesdays, and Fridays.   - Recheck in 4 weeks, same day as visit with Dr [name] Call if placed on any new medications or if scheduled for any procedures [phone].

## 2020-07-25 ENCOUNTER — Observation Stay (HOSPITAL_COMMUNITY)
Admission: EM | Admit: 2020-07-25 | Discharge: 2020-07-27 | Disposition: A | Discharge status: Home / Self Care | Payer: Medicare Other | Attending: Cardiovascular Disease | Admitting: Cardiovascular Disease

## 2020-07-25 ENCOUNTER — Encounter (HOSPITAL_COMMUNITY): Payer: Self-pay | Admitting: Emergency Medicine

## 2020-07-25 ENCOUNTER — Observation Stay (HOSPITAL_BASED_OUTPATIENT_CLINIC_OR_DEPARTMENT_OTHER): Payer: Medicare Other

## 2020-07-25 ENCOUNTER — Other Ambulatory Visit: Payer: Self-pay

## 2020-07-25 ENCOUNTER — Emergency Department (HOSPITAL_COMMUNITY): Payer: Medicare Other

## 2020-07-25 DIAGNOSIS — I4510 Unspecified right bundle-branch block: Secondary | ICD-10-CM | POA: Diagnosis present

## 2020-07-25 DIAGNOSIS — J9811 Atelectasis: Secondary | ICD-10-CM | POA: Diagnosis not present

## 2020-07-25 DIAGNOSIS — R0602 Shortness of breath: Secondary | ICD-10-CM

## 2020-07-25 DIAGNOSIS — Z79899 Other long term (current) drug therapy: Secondary | ICD-10-CM | POA: Insufficient documentation

## 2020-07-25 DIAGNOSIS — R6889 Other general symptoms and signs: Secondary | ICD-10-CM | POA: Diagnosis not present

## 2020-07-25 DIAGNOSIS — J45909 Unspecified asthma, uncomplicated: Secondary | ICD-10-CM | POA: Diagnosis not present

## 2020-07-25 DIAGNOSIS — I350 Nonrheumatic aortic (valve) stenosis: Secondary | ICD-10-CM

## 2020-07-25 DIAGNOSIS — R079 Chest pain, unspecified: Secondary | ICD-10-CM

## 2020-07-25 DIAGNOSIS — Z7901 Long term (current) use of anticoagulants: Secondary | ICD-10-CM

## 2020-07-25 DIAGNOSIS — I499 Cardiac arrhythmia, unspecified: Secondary | ICD-10-CM | POA: Diagnosis not present

## 2020-07-25 DIAGNOSIS — N183 Chronic kidney disease, stage 3 unspecified: Secondary | ICD-10-CM | POA: Diagnosis present

## 2020-07-25 DIAGNOSIS — Z952 Presence of prosthetic heart valve: Secondary | ICD-10-CM | POA: Diagnosis not present

## 2020-07-25 DIAGNOSIS — I130 Hypertensive heart and chronic kidney disease with heart failure and stage 1 through stage 4 chronic kidney disease, or unspecified chronic kidney disease: Secondary | ICD-10-CM | POA: Diagnosis not present

## 2020-07-25 DIAGNOSIS — I200 Unstable angina: Secondary | ICD-10-CM | POA: Diagnosis not present

## 2020-07-25 DIAGNOSIS — I361 Nonrheumatic tricuspid (valve) insufficiency: Secondary | ICD-10-CM

## 2020-07-25 DIAGNOSIS — I351 Nonrheumatic aortic (valve) insufficiency: Secondary | ICD-10-CM | POA: Diagnosis not present

## 2020-07-25 DIAGNOSIS — Z20822 Contact with and (suspected) exposure to covid-19: Secondary | ICD-10-CM | POA: Diagnosis not present

## 2020-07-25 DIAGNOSIS — I5032 Chronic diastolic (congestive) heart failure: Secondary | ICD-10-CM | POA: Diagnosis not present

## 2020-07-25 DIAGNOSIS — I4811 Longstanding persistent atrial fibrillation: Secondary | ICD-10-CM | POA: Diagnosis not present

## 2020-07-25 DIAGNOSIS — Z87891 Personal history of nicotine dependence: Secondary | ICD-10-CM | POA: Insufficient documentation

## 2020-07-25 DIAGNOSIS — N189 Chronic kidney disease, unspecified: Secondary | ICD-10-CM | POA: Diagnosis present

## 2020-07-25 DIAGNOSIS — I509 Heart failure, unspecified: Secondary | ICD-10-CM

## 2020-07-25 DIAGNOSIS — I517 Cardiomegaly: Secondary | ICD-10-CM | POA: Diagnosis not present

## 2020-07-25 DIAGNOSIS — Z743 Need for continuous supervision: Secondary | ICD-10-CM | POA: Diagnosis not present

## 2020-07-25 DIAGNOSIS — I482 Chronic atrial fibrillation, unspecified: Secondary | ICD-10-CM | POA: Diagnosis not present

## 2020-07-25 DIAGNOSIS — I10 Essential (primary) hypertension: Secondary | ICD-10-CM | POA: Diagnosis present

## 2020-07-25 LAB — RESP PANEL BY RT-PCR (FLU A&B, COVID) ARPGX2
Influenza A by PCR: NEGATIVE
Influenza B by PCR: NEGATIVE
SARS Coronavirus 2 by RT PCR: NEGATIVE

## 2020-07-25 LAB — CBC
HCT: 43.1 % (ref 36.0–46.0)
Hemoglobin: 13.1 g/dL (ref 12.0–15.0)
MCH: 27.1 pg (ref 26.0–34.0)
MCHC: 30.4 g/dL (ref 30.0–36.0)
MCV: 89 fL (ref 80.0–100.0)
Platelets: 187 10*3/uL (ref 150–400)
RBC: 4.84 MIL/uL (ref 3.87–5.11)
RDW: 14.5 % (ref 11.5–15.5)
WBC: 5.4 10*3/uL (ref 4.0–10.5)
nRBC: 0 % (ref 0.0–0.2)

## 2020-07-25 LAB — PROTIME-INR
INR: 2.7 — ABNORMAL HIGH (ref 0.8–1.2)
INR: 2.6 — ABNORMAL HIGH (ref 0.8–1.2)
Prothrombin Time: 28.3 seconds — ABNORMAL HIGH (ref 11.4–15.2)
Prothrombin Time: 28.1 seconds — ABNORMAL HIGH (ref 11.4–15.2)

## 2020-07-25 LAB — ECHOCARDIOGRAM COMPLETE
AR max vel: 0.74 cm2
AV Area VTI: 0.74 cm2
AV Area mean vel: 0.78 cm2
AV Mean grad: 12 mmHg
AV Peak grad: 25.4 mmHg
Ao pk vel: 2.52 m/s
Area-P 1/2: 3.85 cm2
Height: 67.992 in
P 1/2 time: 316 msec
S' Lateral: 2.9 cm
Weight: 2670.21 oz

## 2020-07-25 LAB — COMPREHENSIVE METABOLIC PANEL
ALT: 16 U/L (ref 0–44)
AST: 22 U/L (ref 15–41)
Albumin: 4.1 g/dL (ref 3.5–5.0)
Alkaline Phosphatase: 57 U/L (ref 38–126)
Anion gap: 9 (ref 5–15)
BUN: 22 mg/dL (ref 8–23)
CO2: 25 mmol/L (ref 22–32)
Calcium: 9.7 mg/dL (ref 8.9–10.3)
Chloride: 104 mmol/L (ref 98–111)
Creatinine, Ser: 1.53 mg/dL — ABNORMAL HIGH (ref 0.44–1.00)
GFR, Estimated: 34 mL/min — ABNORMAL LOW (ref >60)
Glucose, Bld: 105 mg/dL — ABNORMAL HIGH (ref 70–99)
Potassium: 3.3 mmol/L — ABNORMAL LOW (ref 3.5–5.1)
Sodium: 138 mmol/L (ref 135–145)
Total Bilirubin: 1 mg/dL (ref 0.3–1.2)
Total Protein: 8.3 g/dL — ABNORMAL HIGH (ref 6.5–8.1)

## 2020-07-25 LAB — TROPONIN I (HIGH SENSITIVITY)
Troponin I (High Sensitivity): 14 ng/L (ref <18)
Troponin I (High Sensitivity): 20 ng/L — ABNORMAL HIGH (ref <18)
Troponin I (High Sensitivity): 24 ng/L — ABNORMAL HIGH (ref <18)
Troponin I (High Sensitivity): 23 ng/L — ABNORMAL HIGH (ref <18)

## 2020-07-25 LAB — TSH: TSH: 1.967 u[IU]/mL (ref 0.350–4.500)

## 2020-07-25 LAB — HEMOGLOBIN A1C
Hgb A1c MFr Bld: 6.2 % — ABNORMAL HIGH (ref 4.8–5.6)
Mean Plasma Glucose: 131.24 mg/dL

## 2020-07-25 LAB — LIPASE, BLOOD: Lipase: 30 U/L (ref 11–51)

## 2020-07-25 LAB — APTT: aPTT: 37 seconds — ABNORMAL HIGH (ref 24–36)

## 2020-07-25 MED ORDER — POTASSIUM CHLORIDE CRYS ER 20 MEQ PO TBCR
20.0000 meq | EXTENDED_RELEASE_TABLET | Freq: Every day | ORAL | Status: DC
  Administered 2020-07-25 – 2020-07-27 (×3): 20 meq via ORAL
  Filled 2020-07-25 (×4): qty 1

## 2020-07-25 MED ORDER — NITROGLYCERIN 0.4 MG SL SUBL
0.4000 mg | SUBLINGUAL_TABLET | SUBLINGUAL | Status: DC | PRN
  Administered 2020-07-25 – 2020-07-26 (×2): 0.4 mg via SUBLINGUAL
  Filled 2020-07-25: qty 1

## 2020-07-25 MED ORDER — ASPIRIN EC 81 MG PO TBEC
81.0000 mg | DELAYED_RELEASE_TABLET | Freq: Every day | ORAL | Status: DC
  Administered 2020-07-25 – 2020-07-27 (×3): 81 mg via ORAL
  Filled 2020-07-25 (×4): qty 1

## 2020-07-25 MED ORDER — METOPROLOL TARTRATE 50 MG PO TABS
75.0000 mg | ORAL_TABLET | Freq: Two times a day (BID) | ORAL | Status: DC
  Administered 2020-07-25 – 2020-07-27 (×5): 75 mg via ORAL
  Filled 2020-07-25: qty 1
  Filled 2020-07-25: qty 3
  Filled 2020-07-25 (×4): qty 1

## 2020-07-25 MED ORDER — LATANOPROST 0.005 % OP SOLN
1.0000 [drp] | Freq: Every day | OPHTHALMIC | Status: DC
  Administered 2020-07-25 – 2020-07-26 (×2): 1 [drp] via OPHTHALMIC
  Filled 2020-07-25: qty 2.5

## 2020-07-25 MED ORDER — DORZOLAMIDE HCL-TIMOLOL MAL 2-0.5 % OP SOLN
1.0000 [drp] | Freq: Two times a day (BID) | OPHTHALMIC | Status: DC
  Administered 2020-07-25 – 2020-07-27 (×5): 1 [drp] via OPHTHALMIC
  Filled 2020-07-25: qty 10

## 2020-07-25 MED ORDER — VITAMIN D 25 MCG (1000 UNIT) PO TABS
2000.0000 [IU] | ORAL_TABLET | Freq: Every day | ORAL | Status: DC
  Administered 2020-07-25 – 2020-07-27 (×3): 2000 [IU] via ORAL
  Filled 2020-07-25 (×4): qty 2

## 2020-07-25 MED ORDER — PANTOPRAZOLE SODIUM 40 MG PO TBEC
40.0000 mg | DELAYED_RELEASE_TABLET | Freq: Every day | ORAL | Status: DC
  Administered 2020-07-25 – 2020-07-27 (×3): 40 mg via ORAL
  Filled 2020-07-25 (×5): qty 1

## 2020-07-25 MED ORDER — ONDANSETRON HCL 4 MG/2ML IJ SOLN: 4.0000 mg | Freq: Four times a day (QID) | INTRAMUSCULAR | Status: DC | PRN

## 2020-07-25 MED ORDER — NITROGLYCERIN 0.4 MG SL SUBL: 0.4000 mg | SUBLINGUAL_TABLET | SUBLINGUAL | Status: DC | PRN

## 2020-07-25 MED ORDER — PERFLUTREN LIPID MICROSPHERE
1.0000 mL | INTRAVENOUS | Status: AC | PRN
  Administered 2020-07-25: 3 mL via INTRAVENOUS
  Filled 2020-07-25: qty 10

## 2020-07-25 MED ORDER — DILTIAZEM HCL ER COATED BEADS 120 MG PO CP24: 240.0000 mg | ORAL_CAPSULE | Freq: Every day | ORAL | Status: DC

## 2020-07-25 MED ORDER — ACETAMINOPHEN 325 MG PO TABS: 650.0000 mg | ORAL_TABLET | ORAL | Status: DC | PRN

## 2020-07-25 MED ORDER — ISOSORBIDE MONONITRATE ER 30 MG PO TB24
30.0000 mg | ORAL_TABLET | Freq: Every day | ORAL | Status: DC
  Administered 2020-07-25 – 2020-07-27 (×3): 30 mg via ORAL
  Filled 2020-07-25 (×4): qty 1

## 2020-07-25 NOTE — ED Notes (Signed)
Called report, RN currently with patient and will call back ASAP.

## 2020-07-25 NOTE — ED Provider Notes (Signed)
[facility] EMERGENCY DEPARTMENT Provider Note   CSN: [id number] Arrival date & time: 07/25/20  3016     History No chief complaint on file.   [name] is a 85 y.o. female history includes atrial fibrillation, cardiomyopathy, GERD, hypertension, right bundle branch block, rheumatic mitral valve disease.  85 year old female presented for chest pain, central chest pain cramping squeezing sensation that she first noticed yesterday after laying down to go to bed.  Pain resolved while she slept and returned this morning when she was up walking around and getting ready for the day.  Pain is currently moderate intensity, it was temporarily relieved by nitro prior to arrival but has returned.  Patient declined aspirin as she is on Coumadin.  Patient symptoms are associated with nausea.  Patient denies fall/injury, fever/chills, cough, hemoptysis, abdominal pain, vomiting/diarrhea, extremity swelling/color change or any additional concerns.  HPI  HPI: A 85 year old patient with a history of hypertension and hypercholesterolemia presents for evaluation of chest pain. Initial onset of pain was approximately 1-3 hours ago. The patient's chest pain is described as heaviness/pressure/tightness, is not worse with exertion and is relieved by nitroglycerin. The patient's chest pain is middle- or left-sided, is not well-localized, is not sharp and does not radiate to the arms/jaw/neck. The patient does not complain of nausea and denies diaphoresis. The patient has no history of stroke, has no history of peripheral artery disease, has not smoked in the past 90 days, denies any history of treated diabetes, has no relevant family history of coronary artery disease (first degree relative at less than age 30) and does not have an elevated BMI (>=30).   Past Medical History:  Diagnosis Date   Asthma    years ago   Chronic atrial fibrillation (HCC)    Dilated cardiomyopathy (HCC)    history  of this, now resolved   Dysphagia    Hx   Fracture of tibial plateau 05/21/2015   GERD (gastroesophageal reflux disease)    GI bleed    Hemorrhoid 04/2014   bleeding   History of blood transfusion    Hypertension    Microcytic anemia    Nonsustained ventricular tachycardia (HCC)    Osteoporosis    Protein in urine    RBBB (right bundle branch block)    Rheumatic mitral valve disease     Patient Active Problem List   Diagnosis Date Noted   Unstable angina (HCC) 07/25/2020   Anemia 04/25/2020   Anorectal disorder 04/25/2020   Bloating 04/25/2020   Diarrhea 04/25/2020   Diverticular disease of colon 04/25/2020   Gastro-esophageal reflux disease without esophagitis 04/25/2020   Heart failure (HCC) 04/25/2020   Heartburn 04/25/2020   Hematochezia 04/25/2020   Neck pain 04/25/2020   Other specified symptoms and signs involving the digestive system and abdomen 04/25/2020   Overactive bladder 04/25/2020   Personal history of colonic polyps 04/25/2020   Polyp of colon 04/25/2020   Spinal stenosis 04/25/2020   Weakness 04/25/2020   Leg pain 01/24/2020   Cirrhosis of liver (HCC) 06/22/2019   H/O right hemicolectomy 06/16/2018   Abnormal CT scan, liver 06/16/2018   Urge incontinence 01/08/2018   Health care maintenance 04/14/2017   High risk social situation 06/27/2015   Chronic atrial fibrillation (HCC)    CKD (chronic kidney disease), stage III (HCC) 05/31/2015   Hx of Tibial plateau fracture 2017 05/22/2015   Healthcare maintenance 04/30/2015   Encounter for therapeutic drug monitoring 02/15/2013   Long term current use  of anticoagulant therapy 05/03/2010   RBBB 02/16/2008   VENTRICULAR TACHYCARDIA 02/16/2008   History of mitral valve replacement - St Jude mechanical valve 02/16/2008   HYPERTENSION, BENIGN SYSTEMIC 03/19/2006   RHINITIS, ALLERGIC 03/19/2006   Osteoarthrosis involving lower leg 03/19/2006   Osteoporosis 03/19/2006    Past Surgical History:  Procedure  Laterality Date   CHOLECYSTECTOMY  2005   COLONOSCOPY     COLONOSCOPY W/ POLYPECTOMY     EYE SURGERY  04/2014   HEMICOLECTOMY  2008   forpolyps   MITOMYCIN C APPLICATION Right 05/17/2014   Procedure: MITOMYCIN C APPLICATION;  Surgeon: Chalmers Guest, MD;  Location: [facility] OR;  Service: Ophthalmology;  Laterality: Right;   MITRAL VALVE REPLACEMENT  1987   St Jude mechanical valve   TRABECULECTOMY Right 05/17/2014   Procedure: TRABECULECTOMY WITH MITOMYCIN RIGHT EYE;  Surgeon: Chalmers Guest, MD;  Location: [facility] OR;  Service: Ophthalmology;  Laterality: Right;   TUBAL LIGATION     US ECHOCARDIOGRAPHY  02/2008, 03/2006, 06/2004     OB History   No obstetric history on file.     Family History  Problem Relation Age of Onset   Cancer Father        prostate cancer   Hypertension Mother    Stroke Mother        CVA x 2   Cancer Sister        in the Bladder   Endometriosis Daughter    Stroke Sister    Blindness Sister    Hypertension Sister    Thyroid disease Daughter    Hypertension Daughter    Hypertension Daughter    Hypertension Daughter    Liver disease Daughter    Colon cancer Neg Hx    Breast cancer Neg Hx     Social History   Tobacco Use   Smoking status: Former    Packs/day: 1.50    Years: 15.00    Pack years: 22.50    Types: Cigarettes    Quit date: 1987    Years since quitting: 35.5   Smokeless tobacco: Never   Tobacco comments:    quit 1987  Vaping Use   Vaping Use: Never used  Substance Use Topics   Alcohol use: No   Drug use: No    Home Medications Prior to Admission medications   Medication Sig Start Date End Date Taking? Authorizing Provider  Cholecalciferol (VITAMIN D) 2000 units tablet Take 1 tablet (2,000 Units total) by mouth daily. 06/10/17  Yes Nestor Ramp, MD  digoxin (LANOXIN) 0.125 MG tablet Take 1 tablet (0.125 mg total) by mouth every other day. Please keep upcoming appt in June with Dr. [name] before anymore refills. Thank you Patient taking  differently: Take 0.125 mg by mouth every other day. 08/16/19  Yes Marinus Maw, MD  diltiazem (CARDIZEM CD) 240 MG 24 hr capsule Take 1 capsule (240 mg total) by mouth daily. 08/16/19  Yes Marinus Maw, MD  dorzolamidel-timolol (COSOPT) 22.3-6.8 MG/ML SOLN ophthalmic solution Place 1 drop into both eyes in the morning and at bedtime. 03/02/17  Yes [provider]  hydrochlorothiazide (HYDRODIURIL) 25 MG tablet TAKE 1 TABLET(25 MG) BY MOUTH DAILY Patient taking differently: Take 25 mg by mouth daily. 06/04/20  Yes Nestor Ramp, MD  LUMIGAN 0.01 % SOLN Place 1 drop into both eyes at bedtime. 01/26/17  Yes [provider]  metoprolol tartrate (LOPRESSOR) 50 MG tablet Take 1.5 tablets (75 mg total) by mouth 2 (two) times  daily. 08/16/19 08/30/20 Yes Marinus Maw, MD  pantoprazole (PROTONIX) 40 MG tablet Take 1 tablet (40 mg total) by mouth daily. 08/17/19  Yes Nestor Ramp, MD  polyethylene glycol (MIRALAX / GLYCOLAX) packet Take 17 g by mouth 2 (two) times daily. Patient taking differently: Take 17 g by mouth daily as needed for mild constipation. 05/25/15  Yes Melancon, Hillery Hunter, MD  potassium chloride (KLOR-CON) 10 MEQ tablet Take 2 tablets (20 mEq total) by mouth daily. Patient taking differently: Take 10 mEq by mouth 2 (two) times daily. 08/18/19 09/29/20 Yes Marinus Maw, MD  warfarin (COUMADIN) 10 MG tablet TAKE 1/2 TO 1 TABLET BY MOUTH AS DIRECTED BY COUMADIN CLINIC Patient taking differently: Take 5-10 mg by mouth every evening.  Monday, Wednesday, and Friday,  on Tuesday, Thursday, Saturday and Sunday. 05/02/20  Yes Marinus Maw, MD    Allergies    Esomeprazole magnesium  Review of Systems   Review of Systems Ten systems are reviewed and are negative for acute change except as noted in the HPI  Physical Exam Updated Vital Signs BP (!) 179/100   Pulse 78   Temp 98.5 F (36.9 C)   Resp 14   Ht 5' 7.99" (1.727 m)   Wt 75.7 kg   SpO2 100%   BMI 25.38 kg/m    Physical Exam Constitutional:      General: She is not in acute distress.    Appearance: Normal appearance. She is well-developed. She is not ill-appearing or diaphoretic.  HENT:     Head: Normocephalic and atraumatic.  Eyes:     General: Vision grossly intact. Gaze aligned appropriately.     Pupils: Pupils are equal, round, and reactive to light.  Neck:     Trachea: Trachea and phonation normal.  Cardiovascular:     Rate and Rhythm: Normal rate. Rhythm irregular.  Pulmonary:     Effort: Pulmonary effort is normal. No respiratory distress.     Breath sounds: Normal breath sounds.  Abdominal:     General: There is no distension.     Palpations: Abdomen is soft.     Tenderness: There is no abdominal tenderness. There is no guarding or rebound.  Musculoskeletal:        General: Normal range of motion.     Cervical back: Normal range of motion.     Right lower leg: No edema.     Left lower leg: No edema.  Skin:    General: Skin is warm and dry.  Neurological:     Mental Status: She is alert.     GCS: GCS eye subscore is 4. GCS verbal subscore is 5. GCS motor subscore is 6.     Comments: Speech is clear and goal oriented, follows commands Major Cranial nerves without deficit, no facial droop Moves extremities without ataxia, coordination intact  Psychiatric:        Behavior: Behavior normal.    ED Results / Procedures / Treatments   Labs (all labs ordered are listed, but only abnormal results are displayed) Labs Reviewed  APTT - Abnormal; Notable for the following components:      Result Value   aPTT 37 (*)    All other components within normal limits  COMPREHENSIVE METABOLIC PANEL - Abnormal; Notable for the following components:   Potassium 3.3 (*)    Glucose, Bld 105 (*)    Creatinine, Ser 1.53 (*)    Total Protein 8.3 (*)    GFR, Estimated 34 (*)  All other components within normal limits  PROTIME-INR - Abnormal; Notable for the following components:    Prothrombin Time 28.3 (*)    INR 2.7 (*)    All other components within normal limits  TROPONIN I (HIGH SENSITIVITY) - Abnormal; Notable for the following components:   Troponin I (High Sensitivity) 20 (*)    All other components within normal limits  RESP PANEL BY RT-PCR (FLU A&B, COVID) ARPGX2  CBC  LIPASE, BLOOD  PROTIME-INR  HEMOGLOBIN A1C  TSH  TROPONIN I (HIGH SENSITIVITY)  TROPONIN I (HIGH SENSITIVITY)    EKG EKG Interpretation  Date/Time:  Wednesday July 25 2020 09:40:24 EDT Ventricular Rate:  76 PR Interval:    QRS Duration: 190 QT Interval:  460 QTC Calculation: 517 R Axis:   -74 Text Interpretation: Atrial fibrillation Left axis deviation Right bundle branch block Abnormal ECG old RBBB, no change from previous Confirmed by Arby Barrette [phone]) on 07/25/2020 9:53:28 AM  Radiology DG Chest 1 View  Result Date: 07/25/2020 CLINICAL DATA:  Chest pain.  Shortness of breath. EXAM: CHEST  1 VIEW COMPARISON:  06/01/2015. FINDINGS: Prior CABG. Stable cardiomegaly. No pulmonary venous congestion. Low lung volumes with mild right base atelectasis. Tiny left pleural effusion versus pleural scarring. No pneumothorax. IMPRESSION: 1. Prior CABG. Stable cardiomegaly. No pulmonary venous congestion. 2. Low lung volumes with mild right base atelectasis. Tiny left pleural effusion versus pleural scarring. Electronically Signed   By: Maisie Fus  Register   On: 07/25/2020 10:44    Procedures .Critical Care  Date/Time: 07/25/2020 2:25 PM Performed by: Bill Salinas, PA-C Authorized by: Bill Salinas, PA-C   Critical care provider statement:    Critical care time (minutes):  35   Critical care was time spent personally by me on the following activities:  Discussions with consultants, evaluation of patient's response to treatment, examination of patient, ordering and performing treatments and interventions, ordering and review of laboratory studies, ordering and review of radiographic  studies, pulse oximetry, re-evaluation of patient's condition, obtaining history from patient or surrogate, review of old charts and development of treatment plan with patient or surrogate   Medications Ordered in ED Medications  nitroGLYCERIN (NITROSTAT) SL tablet 0.4 mg (0.4 mg Sublingual Given 07/25/20 1242)  metoprolol tartrate (LOPRESSOR) tablet 75 mg (75 mg Oral Given 07/25/20 1303)  pantoprazole (PROTONIX) EC tablet 40 mg (has no administration in time range)  cholecalciferol (VITAMIN D3) tablet 2,000 Units (has no administration in time range)  potassium chloride SA (KLOR-CON) CR tablet 20 mEq (has no administration in time range)  aspirin EC tablet 81 mg (has no administration in time range)  acetaminophen (TYLENOL) tablet 650 mg (has no administration in time range)  ondansetron (ZOFRAN) injection 4 mg (has no administration in time range)    ED Course  I have reviewed the triage vital signs and the nursing notes.  Pertinent labs & imaging results that were available during my care of the patient were reviewed by me and considered in my medical decision making (see chart for details).  Clinical Course as of 07/25/20 1427  Wed Jul 25, 2020  1144 Trish [BM]    Clinical Course User Index [BM] Elizabeth Palau   MDM Rules/Calculators/A&P HEAR Score: 5                       Additional history obtained from: Nursing notes from this visit. Review of electronic medical records. ----- 85 year old female presented with intermittent chest  pain beginning last night, worsened today when moving around and getting ready.  She has extensive past medical history as above.  Pain is improved with nitroglycerin.  Cardiac work-up initiated, concern at this time is for angina/ACS, low suspicion at this time for PE, dissection or infectious etiologies. - I personally reviewed and interpreted the following labs:  High-sensitivity troponin initially 14 and within normal limits, delta  troponin elevated at 20. INR 2.7. Lipase 30. CBC without leukocytosis, anemia or thrombocytopenia. CMP shows baseline creatinine at 1.53, no emergent electrolyte derangement, AKI, LFT elevations or gap.  CXR:  IMPRESSION:  1. Prior CABG. Stable cardiomegaly. No pulmonary venous congestion.     2. Low lung volumes with mild right base atelectasis. Tiny left  pleural effusion versus pleural scarring.   EKG: Atrial fibrillation Left axis deviation Right bundle branch block Abnormal ECG old RBBB, no change from previous Confirmed by Arby BarrettePfeiffer, Marcy [phone] on 07/25/2020 9:53:28 AM ---------- Patient was noted to be hypertensive in the ER, she reports she did not take her daily medications.  I placed an order for metoprolol 75 mg.  She was also given additional nitro with relief of her chest pain.  Chest pain was intermittent in the ER.  On reevaluation she reports chest pain improved following nitroglycerin.  Vital signs are stable.  Consult placed to cardiology, patient was seen and evaluated and then admitted to cardiology service for concern of unstable angina.  Note: Portions of this report may have been transcribed using voice recognition software. Every effort was made to ensure accuracy; however, inadvertent computerized transcription errors may still be present. Final Clinical Impression(s) / ED Diagnoses Final diagnoses:  Unstable angina [facility](HCC)    Rx / DC Orders ED Discharge Orders     None        Elizabeth PalauMorelli, Zula Hovsepian A, PA-C 07/25/20 1428    Arby BarrettePfeiffer, Marcy, MD 07/26/20 [phone]

## 2020-07-25 NOTE — Progress Notes (Signed)
ANTICOAGULATION CONSULT NOTE - Initial Consult  Pharmacy Consult for heparin Indication:  mechanical MVR  Allergies  Allergen Reactions   Esomeprazole Magnesium     REACTION: stomach upset, nausea    Patient Measurements:   Heparin Dosing Weight: 75.7 kg  Vital Signs: Temp: 98.5 F (36.9 C) (07/06 0940) BP: 179/100 (07/06 1400) Pulse Rate: 78 (07/06 1400)  Labs: Recent Labs    07/24/20 0844 07/25/20 0954 07/25/20 1154  HGB  --  13.1  --   HCT  --  43.1  --   PLT  --  187  --   APTT  --  37*  --   LABPROT  --  28.3*  --   INR 3.2* 2.7*  --   CREATININE  --  1.53*  --   TROPONINIHS  --  14 20*    CrCl cannot be calculated (Unknown ideal weight.).  Assessment: 85 yo F with mechanical MVR, chronic coumadin (last INR 2.7 this AM), CHF, HTN, mild aortic stenosis, GERD, and CKD stage III.   Home regimen of warfarin 5mg  every day except 10mg  on MWF.  INR was 3.2 in Anticoag clinic on 7/5.  Goal of Therapy:  Heparin level 0.3-0.7 units/ml Monitor platelets by anticoagulation protocol: Yes   Plan:  Will obtain another INR at 2000 tonight as I anticipate that INR will be < 2.5 at this point since pt takes warfarin in the evenings. Start heparin gtt once INR < 2.5.  , PharmD, [facility] Emergency Medicine Clinical Pharmacist ED RPh Phone: [phone] Main RX: [phone]

## 2020-07-25 NOTE — ED Provider Notes (Signed)
I provided a substantive portion of the care of this patient.  I personally performed the entirety of the history for this encounter.  EKG Interpretation  Date/Time:  Wednesday July 25 2020 09:40:24 EDT Ventricular Rate:  76 PR Interval:    QRS Duration: 190 QT Interval:  460 QTC Calculation: 517 R Axis:   -74 Text Interpretation: Atrial fibrillation Left axis deviation Right bundle branch block Abnormal ECG old RBBB, no change from previous Confirmed by Arby Barrette [phone]) on 07/25/2020 9:53:28 AM   Patient had a good day yesterday.  She was active out shopping with no chest pain.  Chest pain started yesterday evening before bed.  Has been waxing and waning.  It comes and short episodes of a gripping pain in her central lower chest.  No recent fever or cough.  Patient was transported by EMS.  She was given nitroglycerin with some improvement in chest pain.  Patient has not taken her morning blood pressure medications yet.  Patient is alert and nontoxic.  No respiratory distress at rest.  Mental status clear.  Lungs grossly clear.  Abdomen soft without guarding.  Mild epigastric discomfort.  Lower extremity symmetric without calf tenderness to compression.  Agree with plan of management.   Arby Barrette, MD 07/25/20 1245

## 2020-07-25 NOTE — Progress Notes (Signed)
Cancel coronary CTA as patient in afib. Will let her eat.  NPO after night for stress test in AM. If significant elevated troponin >> likely cath in AM.

## 2020-07-25 NOTE — H&P (Addendum)
Cardiology H & P:   Patient ID: Tami MatterSally M Selner MRN: [id number]; DOB: [birth year]  Admit date: 07/25/2020 Date of Consult: 07/25/2020  PCP:  Nestor RampNeal, Sara L, MD   [facility] Providers Cardiologist:  Lewayne BuntingGregg Taylor, MD     Patient Profile:   [name] is a 85 y.o. female with a hx of St Jude mech MVR [id number], chronic coumadin, chronic afib, chronic diastolic CHF with venous stasis, HTN, mild aortic stenosis, GERD, NSVT, anemia, RBBB  and CKD III (baseline Scr 1.3-1.5) who is being seen 07/25/2020 for the evaluation of Chest pain at the request of Dr. Donnald GarrePfeiffer.   Stress test 05/2015: Low risk without evidence of ischemia.  Echo 05/2017: LVEF 50-55%, there is hypokinesis of the inferolateral and inferior  Myocardium, mild AS with mean gradient of 14mm Hg, normal functioning mitral valve.   She was going well on cardiac stand point when last seen by Dr. Ladona Ridgelaylor 07/2019.   Seen by Dr. Chestine Sporelark 01/24/20 for lower extremity pain,swelling etc with concern for arterial and/or venous disease. ABI of 0.91 on the right and 0.78 on the left. Venous doppler negative for DVT. Venous reflux study does demonstrate incompetence of bilateral greater saphenous veins however only in the saphenofemoral junction and proximal thigh. Recommended compression stocking.   History of Present Illness:   [name] was USOH until last night when had chest pain. She had really good day yesterday, when for shopping. She went to bed around 8:30 and noted chest discomfort when woke up to use bathroom. Waxing and weaning pain all night. She described her pain as "tight pressure" at lower sternal area. She woke up this morning with worsen pain. Had associated SOB, nausea and palpitations. Nothing make pain worse or better. Each episode lasted for about 10-15 minutes. She received 2 SL nitro with improved pain but returned. EKG with chronic afib and RBBB. Patient reports chronic LE edema and takes HCTZ. BP elevated but hasn't took multiple  antihypertensive this morning.   Hs-troponin 14>>20 INR 2.7 SCr 1.53 K 3.3 NA 138 Chest X-ray without acute abnormality   Received additional SL nitro x 1 in ER. Her lower sternal pain has resolved but some epigastric pain on exam. Patient does have hx of GERD but current symptoms are different.   Past Medical History:  Diagnosis Date   Asthma    years ago   Chronic atrial fibrillation (HCC)    Dilated cardiomyopathy (HCC)    history of this, now resolved   Dysphagia    Hx   Fracture of tibial plateau 05/21/2015   GERD (gastroesophageal reflux disease)    GI bleed    Hemorrhoid 04/2014   bleeding   History of blood transfusion    Hypertension    Microcytic anemia    Nonsustained ventricular tachycardia (HCC)    Osteoporosis    Protein in urine    RBBB (right bundle branch block)    Rheumatic mitral valve disease     Past Surgical History:  Procedure Laterality Date   CHOLECYSTECTOMY  2005   COLONOSCOPY     COLONOSCOPY W/ POLYPECTOMY     EYE SURGERY  04/2014   HEMICOLECTOMY  2008   forpolyps   MITOMYCIN C APPLICATION Right 05/17/2014   Procedure: MITOMYCIN C APPLICATION;  Surgeon: Chalmers Guestoy Whitaker, MD;  [facility];  Service: Ophthalmology;  Laterality: Right;   MITRAL VALVE REPLACEMENT  1987   St Jude mechanical valve   TRABECULECTOMY Right 05/17/2014   Procedure: TRABECULECTOMY  WITH MITOMYCIN RIGHT EYE;  Surgeon: Chalmers Guest, MD;  Location: [facility] OR;  Service: Ophthalmology;  Laterality: Right;   TUBAL LIGATION     US ECHOCARDIOGRAPHY  02/2008, 03/2006, 06/2004    Inpatient Medications: Scheduled Meds:  metoprolol tartrate  75 mg Oral BID   Continuous Infusions:  PRN Meds: nitroGLYCERIN  Allergies:    Allergies  Allergen Reactions   Esomeprazole Magnesium     REACTION: stomach upset, nausea    Social History:   Social History   Socioeconomic History   Marital status: Divorced    Spouse name: Not on file   Number of children: 5   Years of education:  12   Highest education level: High school graduate  Occupational History   Occupation: Retired  Tobacco Use   Smoking status: Former    Packs/day: 1.50    Years: 15.00    Pack years: 22.50    Types: Cigarettes    Quit date: 1987    Years since quitting: 35.5   Smokeless tobacco: Never   Tobacco comments:    quit 1987  Vaping Use   Vaping Use: Never used  Substance and Sexual Activity   Alcohol use: No   Drug use: No   Sexual activity: Not Currently  Other Topics Concern   Not on file  Social History Narrative   Patient lives alone in [place].   Patient has 3 children who live near her and offer support. 2 live up [place].   Patient is active in [place] and enjoys walking for exercise.    Social Determinants of Health   Financial Resource Strain: Not on file  Food Insecurity: Not on file  Transportation Needs: Not on file  Physical Activity: Not on file  Stress: Not on file  Social Connections: Not on file  Intimate Partner Violence: Not on file    Family History:    Family History  Problem Relation Age of Onset   Cancer Father        prostate cancer   Hypertension Mother    Stroke Mother        CVA x 2   Cancer Sister        in the Bladder   Endometriosis Daughter    Stroke Sister    Blindness Sister    Hypertension Sister    Thyroid disease Daughter    Hypertension Daughter    Hypertension Daughter    Hypertension Daughter    Liver disease Daughter    Colon cancer Neg Hx    Breast cancer Neg Hx      ROS:  Please see the history of present illness.  All other ROS reviewed and negative.     Physical Exam/Data:   Vitals:   07/25/20 1230 07/25/20 1300 07/25/20 1303 07/25/20 1330  BP: (!) 189/88 (!) 199/111 (!) 199/111 (!) 165/89  Pulse: 75 83  85  Resp: 15 (!) 23  (!) 23  Temp:      SpO2: 99% 100%  97%   No intake or output data in the 24 hours ending 07/25/20 1351 Last 3 Weights 05/23/2020 05/02/2020 01/24/2020  Weight (lbs) 166 lb 14.4 oz 167 lb  170 lb  Weight (kg) 75.705 kg 75.751 kg 77.111 kg     There is no height or weight on file to calculate BMI.  General:  Well nourished, well developed, in no acute distress HEENT: normal Lymph: no adenopathy Neck: no JVD Endocrine:  No thryomegaly Vascular: No carotid bruits; FA pulses  2+ bilaterally without bruits  Cardiac:  normal S1, S2; RRR; crisp mechanical valve sound and 2/6 murmur  Lungs:  clear to auscultation bilaterally, no wheezing, rhonchi or rales  Abd: soft, nontender, no hepatomegaly  Ext: no edema Musculoskeletal:  No deformities, BUE and BLE strength normal and equal Skin: warm and dry  Neuro:  CNs 2-12 intact, no focal abnormalities noted Psych:  Normal affect   EKG:  The EKG was personally reviewed and demonstrates:  Afib, HR 78, RBBB Telemetry:  Telemetry was personally reviewed and demonstrates:  afib and RBBB at controlled rate   Relevant CV Studies:  Echo 05/2017 Study Conclusions   - Left ventricle: Cannot visualize the apex well, especially in the    apical 4 chamber view. Suggest definity contrast study to    evaluate for wall motion abnormality. The cavity size was normal.    There was mild concentric hypertrophy. Systolic function was    normal. The estimated ejection fraction was in the range of 50%    to 55%. There is hypokinesis of the inferolateral and inferior    myocardium.  - Aortic valve: Trileaflet; normal thickness, mildly calcified    leaflets. There was mild stenosis. Mean gradient (S): 14 mm Hg.  - Mitral valve: A mechanical prosthesis was present and functioning    normally. Regurgitation could not be evaluated due to acoustic    artifact from the prosthesis. Mean gradient (D): 6 mm Hg. Valve    area by continuity equation (using LVOT flow): 1.85 cm^2.  - Left atrium: The atrium was severely dilated.  - Right ventricle: The cavity size was mildly dilated. Wall    thickness was normal. Systolic function was mildly reduced.  - Right  atrium: The atrium was severely dilated.  - Pulmonic valve: There was trivial regurgitation.  - Pulmonary arteries: PA peak pressure: 40 mm Hg (S).  Myoview 05/2015 IMPRESSION: 1. No definite inducible or reversible ischemia with pharmacologic stress.   Fixed inferior wall defect versus attenuation artifact.   2. Normal left ventricular wall motion.   3. Left ventricular ejection fraction 62%   4. Low-risk stress test findings*.  Laboratory Data:  High Sensitivity Troponin:   Recent Labs  Lab 07/25/20 0954 07/25/20 1154  TROPONINIHS 14 20*     Chemistry Recent Labs  Lab 07/25/20 0954  NA 138  K 3.3*  CL 104  CO2 25  GLUCOSE 105*  BUN 22  CREATININE 1.53*  CALCIUM 9.7  GFRNONAA 34*  ANIONGAP 9    Recent Labs  Lab 07/25/20 0954  PROT 8.3*  ALBUMIN 4.1  AST 22  ALT 16  ALKPHOS 57  BILITOT 1.0   Hematology Recent Labs  Lab 07/25/20 0954  WBC 5.4  RBC 4.84  HGB 13.1  HCT 43.1  MCV 89.0  MCH 27.1  MCHC 30.4  RDW 14.5  PLT 187    Radiology/Studies:  DG Chest 1 View  Result Date: 07/25/2020 CLINICAL DATA:  Chest pain.  Shortness of breath. EXAM: CHEST  1 VIEW COMPARISON:  06/01/2015. FINDINGS: Prior CABG. Stable cardiomegaly. No pulmonary venous congestion. Low lung volumes with mild right base atelectasis. Tiny left pleural effusion versus pleural scarring. No pneumothorax. IMPRESSION: 1. Prior CABG. Stable cardiomegaly. No pulmonary venous congestion. 2. Low lung volumes with mild right base atelectasis. Tiny left pleural effusion versus pleural scarring. Electronically Signed   By: [name]  Register   On: 07/25/2020 10:44     Assessment and Plan:   Chest pain concerning  for [place] Waxing and weaning since last night. Went for shopping yesterday without any symptoms. Worsen pain this morning with associated SOB, nausea and ? Palpitations. Pain improved after nitro x 2 but return. Received additional nitro x 1 in ER. Chest pain pain free during my  evaluation. Epigastric pain on exam. Current symptoms different than typical GERD. Hs-troponin 14>>20. EKG without acute abnormality. Normal Lipase. Stress test in 2017 was low risk.  - Symptoms could be due to [place] - Admit and cycle troponin  - Will review plan with MD  2. S/p mechanical AVR  - Will update echo - normal function valve by last echo in 2019 - on coumadin for anticoagulation>> heparin per pharmacy for mechanical AVR - INR 2.7  3. CKD IIIa -baseline Scr 1.3-1.5 -Scr of 1.53 today   4. Chronic diastolic CHF with venous stasis - Update echo - No grossly volume overloaded - Hold  HCTZ today   5. HTN - BP elevated - Continue home BB and CCB - Hold HCTZ for possible coronary CTA  6. Mild AS - Update echo   Risk Assessment/Risk Scores:   HEAR Score (for undifferentiated chest pain):  HEAR Score: 5{   New York Heart Association (NYHA) Functional Class NYHA Class II  CHA2DS2-VASc Score = 5  This indicates a 7.2% annual risk of stroke. The patient's score is based upon: CHF History: Yes HTN History: Yes Diabetes History: No Stroke History: No Vascular Disease History: No Age Score: 2 Gender Score: 1    For questions or updates, please contact [facility] Please consult [email or web address] for contact info under    Lorelei Pont, PA  07/25/2020 1:51 PM   Attending Note:   The patient was seen and examined.  Agree with assessment and plan as noted above.  Changes made to the above note as needed.  Patient seen and independently examined with Chelsea Aus, PA .   We discussed all aspects of the encounter. I agree with the assessment and plan as stated above.     Chest discomfort:   concerning for UAP.   Was relieved with SL NTG.  Pressure like sensation  Will see if we can get a Coronary CTA today. If her troponins go higher, I would recommend cath .   2. HTN:  has not received her BP meds today.   Will restart meds   3.  MVR:  mechanical  valve sounds good.    I have spent a total of 40 minutes with patient reviewing hospital  notes , telemetry, EKGs, labs and examining patient as well as establishing an assessment and plan that was discussed with the patient.  > 50% of time was spent in direct patient care.    Vesta Mixer, Montez Hageman., MD, [place] [place] 07/25/2020, 2:37 PM [address]  [address] Office [phone] Pager [phone]

## 2020-07-25 NOTE — ED Triage Notes (Signed)
Pt here from home with c/o chest pain , along with some sob and nausea , pt received 2 nitro from ems , pt refuse asa , did not want to taker due to being on coumadin for valve replacement

## 2020-07-25 NOTE — Progress Notes (Signed)
  Echocardiogram 2D Echocardiogram has been performed.  [name] 07/25/2020, 4:36 PM

## 2020-07-25 NOTE — ED Notes (Signed)
RN ambulated pt to bathroom.  

## 2020-07-25 NOTE — ED Provider Notes (Signed)
Emergency Medicine Provider Triage Evaluation Note  [name] , a 85 y.o. female  was evaluated in triage.  Pt complains of chest pain x 1 day. Constant, feels like pressure, substernal with associated nausea and SOB. On coumadin for mitral valve replacement. .   Review of Systems  Positive: Chest pain, nausea, SOB, leg swelling  Negative: Vomiting   Physical Exam  BP (!) 173/94 (BP Location: Right Arm)   Pulse 83   Temp 98.5 F (36.9 C)   Resp 16   SpO2 98%  Gen:   Awake, no distress   Resp:  Normal effort  MSK:   Moves extremities without difficulty  Other:  Rhythm irregularly irregular   Medical Decision Making  Medically screening exam initiated at 9:53 AM.  Appropriate orders placed.  [name] was informed that the remainder of the evaluation will be completed by another provider, this initial triage assessment does not replace that evaluation, and the importance of remaining in the ED until their evaluation is complete.     Theron Arista, PA-C 07/25/20 3299    Arby Barrette, MD 07/26/20 [phone]

## 2020-07-25 NOTE — ED Notes (Signed)
IV team at bedside 

## 2020-07-26 ENCOUNTER — Observation Stay (HOSPITAL_COMMUNITY): Payer: Medicare Other

## 2020-07-26 DIAGNOSIS — I200 Unstable angina: Secondary | ICD-10-CM | POA: Diagnosis not present

## 2020-07-26 DIAGNOSIS — R079 Chest pain, unspecified: Secondary | ICD-10-CM

## 2020-07-26 DIAGNOSIS — Z20822 Contact with and (suspected) exposure to covid-19: Secondary | ICD-10-CM | POA: Diagnosis not present

## 2020-07-26 DIAGNOSIS — Z87891 Personal history of nicotine dependence: Secondary | ICD-10-CM | POA: Diagnosis not present

## 2020-07-26 DIAGNOSIS — I4811 Longstanding persistent atrial fibrillation: Secondary | ICD-10-CM | POA: Diagnosis not present

## 2020-07-26 DIAGNOSIS — N183 Chronic kidney disease, stage 3 unspecified: Secondary | ICD-10-CM | POA: Diagnosis not present

## 2020-07-26 DIAGNOSIS — I5032 Chronic diastolic (congestive) heart failure: Secondary | ICD-10-CM | POA: Diagnosis not present

## 2020-07-26 DIAGNOSIS — Z7901 Long term (current) use of anticoagulants: Secondary | ICD-10-CM | POA: Diagnosis not present

## 2020-07-26 DIAGNOSIS — Z952 Presence of prosthetic heart valve: Secondary | ICD-10-CM

## 2020-07-26 DIAGNOSIS — J45909 Unspecified asthma, uncomplicated: Secondary | ICD-10-CM | POA: Diagnosis not present

## 2020-07-26 DIAGNOSIS — I130 Hypertensive heart and chronic kidney disease with heart failure and stage 1 through stage 4 chronic kidney disease, or unspecified chronic kidney disease: Secondary | ICD-10-CM | POA: Diagnosis not present

## 2020-07-26 DIAGNOSIS — I482 Chronic atrial fibrillation, unspecified: Secondary | ICD-10-CM | POA: Diagnosis not present

## 2020-07-26 DIAGNOSIS — Z79899 Other long term (current) drug therapy: Secondary | ICD-10-CM | POA: Diagnosis not present

## 2020-07-26 LAB — NM MYOCAR MULTI W/SPECT W/WALL MOTION / EF
Estimated workload: 1 METS
Exercise duration (min): 17 min
Exercise duration (sec): 17 s
MPHR: 138 {beats}/min
Peak HR: 112 {beats}/min
Percent HR: 81 %
Rest HR: 74 {beats}/min

## 2020-07-26 LAB — CBC
HCT: 38 % (ref 36.0–46.0)
Hemoglobin: 11.8 g/dL — ABNORMAL LOW (ref 12.0–15.0)
MCH: 27 pg (ref 26.0–34.0)
MCHC: 31.1 g/dL (ref 30.0–36.0)
MCV: 87 fL (ref 80.0–100.0)
Platelets: 184 10*3/uL (ref 150–400)
RBC: 4.37 MIL/uL (ref 3.87–5.11)
RDW: 14.3 % (ref 11.5–15.5)
WBC: 5.3 10*3/uL (ref 4.0–10.5)
nRBC: 0 % (ref 0.0–0.2)

## 2020-07-26 LAB — LIPID PANEL
Cholesterol: 180 mg/dL (ref 0–200)
HDL: 58 mg/dL (ref >40)
LDL Cholesterol: 98 mg/dL (ref 0–99)
Total CHOL/HDL Ratio: 3.1 RATIO
Triglycerides: 118 mg/dL (ref <150)
VLDL: 24 mg/dL (ref 0–40)

## 2020-07-26 LAB — BASIC METABOLIC PANEL
Anion gap: 7 (ref 5–15)
BUN: 21 mg/dL (ref 8–23)
CO2: 29 mmol/L (ref 22–32)
Calcium: 9.7 mg/dL (ref 8.9–10.3)
Chloride: 104 mmol/L (ref 98–111)
Creatinine, Ser: 1.49 mg/dL — ABNORMAL HIGH (ref 0.44–1.00)
GFR, Estimated: 35 mL/min — ABNORMAL LOW (ref >60)
Glucose, Bld: 106 mg/dL — ABNORMAL HIGH (ref 70–99)
Potassium: 3.6 mmol/L (ref 3.5–5.1)
Sodium: 140 mmol/L (ref 135–145)

## 2020-07-26 LAB — PROTIME-INR
INR: 2.6 — ABNORMAL HIGH (ref 0.8–1.2)
INR: 2.4 — ABNORMAL HIGH (ref 0.8–1.2)
Prothrombin Time: 27.9 seconds — ABNORMAL HIGH (ref 11.4–15.2)
Prothrombin Time: 26.1 seconds — ABNORMAL HIGH (ref 11.4–15.2)

## 2020-07-26 LAB — DIGOXIN LEVEL: Digoxin Level: <0.2 ng/mL — ABNORMAL LOW (ref 0.8–2.0)

## 2020-07-26 MED ORDER — NITROGLYCERIN 0.4 MG SL SUBL
SUBLINGUAL_TABLET | SUBLINGUAL | Status: AC
  Administered 2020-07-26: 0.4 mg via SUBLINGUAL
  Filled 2020-07-26: qty 1

## 2020-07-26 MED ORDER — WARFARIN SODIUM 5 MG PO TABS
10.0000 mg | ORAL_TABLET | Freq: Once | ORAL | Status: AC
  Administered 2020-07-26: 10 mg via ORAL
  Filled 2020-07-26: qty 2

## 2020-07-26 MED ORDER — WARFARIN - PHARMACIST DOSING INPATIENT: Freq: Every day | Status: DC

## 2020-07-26 MED ORDER — HEPARIN (PORCINE) 25000 UT/250ML-% IV SOLN
1000.0000 [IU]/h | INTRAVENOUS | Status: DC
  Administered 2020-07-26: 1000 [IU]/h via INTRAVENOUS
  Filled 2020-07-26: qty 250

## 2020-07-26 MED ORDER — TECHNETIUM TC 99M TETROFOSMIN IV KIT
32.0000 | PACK | Freq: Once | INTRAVENOUS | Status: AC | PRN
  Administered 2020-07-26: 32 via INTRAVENOUS

## 2020-07-26 MED ORDER — REGADENOSON 0.4 MG/5ML IV SOLN
0.4000 mg | Freq: Once | INTRAVENOUS | Status: AC
  Filled 2020-07-26: qty 5

## 2020-07-26 MED ORDER — TECHNETIUM TC 99M TETROFOSMIN IV KIT
10.8000 | PACK | Freq: Once | INTRAVENOUS | Status: AC | PRN
  Administered 2020-07-26: 10.8 via INTRAVENOUS

## 2020-07-26 MED ORDER — REGADENOSON 0.4 MG/5ML IV SOLN
INTRAVENOUS | Status: AC
  Administered 2020-07-26: 0.4 mg via INTRAVENOUS
  Filled 2020-07-26: qty 5

## 2020-07-26 MED ORDER — AMINOPHYLLINE 25 MG/ML IV (NUC MED)
75.0000 mg | Freq: Once | INTRAVENOUS | Status: AC
  Administered 2020-07-26: 75 mg via INTRAVENOUS

## 2020-07-26 MED ORDER — AMINOPHYLLINE 25 MG/ML IV SOLN
INTRAVENOUS | Status: AC
  Filled 2020-07-26: qty 10

## 2020-07-26 NOTE — Progress Notes (Signed)
ANTICOAGULATION CONSULT NOTE - Initial Consult  Pharmacy Consult for heparin Indication:  mechanical MVR  Allergies  Allergen Reactions   Esomeprazole Magnesium     REACTION: stomach upset, nausea    Patient Measurements: Height: 5' 7.99" (172.7 cm) Weight: 72.2 kg (159 lb 3.2 oz) IBW/kg (Calculated) : 63.88 Heparin Dosing Weight: 75.7 kg  Vital Signs: Temp: 97.7 F (36.5 C) (07/07 0507) Temp Source: Oral (07/07 0507) BP: 167/110 (07/07 0931) Pulse Rate: 66 (07/07 0507)  Labs: Recent Labs    07/25/20 0954 07/25/20 1154 07/25/20 1651 07/25/20 2129 07/26/20 0515  HGB 13.1  --   --   --  11.8*  HCT 43.1  --   --   --  38.0  PLT 187  --   --   --  184  APTT 37*  --   --   --   --   LABPROT 28.3*  --   --  28.1* 27.9*  INR 2.7*  --   --  2.6* 2.6*  CREATININE 1.53*  --   --   --  1.49*  TROPONINIHS 14 20* 24* 23*  --      Estimated Creatinine Clearance: 29.4 mL/min (A) (by C-G formula based on SCr of 1.49 mg/dL (H)).  Assessment: 85 yo F with mechanical MVR, chronic coumadin (last INR 2.6 this AM), CHF, HTN, mild aortic stenosis, GERD, and CKD stage III.   Home regimen of warfarin 5mg  every day except 10mg  on MWF.  INR was 3.2 in Anticoag clinic on 7/5. INR today down to 2.6. CBC stable. No notes indicating bleeding.   Goal of Therapy:  Heparin level 0.3-0.7 units/ml Monitor platelets by anticoagulation protocol: Yes   Plan:  Will obtain another INR this afternoon. Start heparin gtt once INR < 2.5.  , PharmD PGY1 Resident Phone: [phone] 07/26/2020 9:55 AM

## 2020-07-26 NOTE — Progress Notes (Addendum)
ANTICOAGULATION CONSULT NOTE - Follow Up Consult  Pharmacy Consult for IV Heparin >> Warfarin Indication:  Mechanical Mitral Valve, Atrial Fibrillation  Allergies  Allergen Reactions   Esomeprazole Magnesium     REACTION: stomach upset, nausea    Patient Measurements: Height: 5' 7.99" (172.7 cm) Weight: 72.2 kg (159 lb 3.2 oz) IBW/kg (Calculated) : 63.88 Heparin Dosing Weight: 75.7 kg  Vital Signs: Temp: 98 F (36.7 C) (07/07 1100) Temp Source: Oral (07/07 1100) BP: 146/97 (07/07 1323) Pulse Rate: 88 (07/07 1400)  Labs: Recent Labs    07/25/20 0954 07/25/20 1154 07/25/20 1651 07/25/20 2129 07/26/20 0515 07/26/20 1350  HGB 13.1  --   --   --  11.8*  --   HCT 43.1  --   --   --  38.0  --   PLT 187  --   --   --  184  --   APTT 37*  --   --   --   --   --   LABPROT 28.3*  --   --  28.1* 27.9* 26.1*  INR 2.7*  --   --  2.6* 2.6* 2.4*  CREATININE 1.53*  --   --   --  1.49*  --   TROPONINIHS 14 20* 24* 23*  --   --     Estimated Creatinine Clearance: 29.4 mL/min (A) (by C-G formula based on SCr of 1.49 mg/dL (H)).  Assessment: 85 yr old female with mechanical mitral valve, on chronic warfarin (INR at anticoagulation clinic on 7/5 was 3.2, INR this afternoon is down to 2.4); CHF, HTN, mild aortic stenosis, GERD, and CKD stage III.   Home regimen of warfarin is 5 mg every day except 10 mg on MWF.  Plan was to start IV heparin infusion once INR <2.5; INR now 2.4, so starting heparin infusion at 1000 units/hr this afternoon. H/H 11.8/38.0, plt 184. Per RN, no bleeding observed.  Pharmacy is also consulted to resume warfarin therapy, with plans to discharge pt once INR >2.5.  Goal of Therapy:  Heparin level 0.3-0.7 units/ml INR goal: 2.5-3.5 Monitor platelets by anticoagulation protocol: Yes   Plan:  Start heparin infusion at 1000 units/hr Check 8-hr heparin level Warfarin 10 mg PO X 1 today Monitor daily INR, heparin level, CBC Monitor for bleeding  Vicki Mallet, PharmD, BCPS, [facility] Clinical Pharmacist 07/26/2020 4:54 PM

## 2020-07-26 NOTE — Progress Notes (Signed)
   [name] presented for a nuclear stress test today.  No immediate complications.  Stress imaging is pending at this time.  Preliminary EKG findings may be listed in the chart, but the stress test result will not be finalized until perfusion imaging is complete.  1 day study, [place] to read.  Pt developed 10/10 CP w/ Lexiscan. Pt given aminophylline, SL nitro x 2, O2. Sx gradually improved. By test end, pt pain-free  Theodore Demark, PA-C 07/26/2020, 10:55 AM

## 2020-07-26 NOTE — Care Management Obs Status (Signed)
MEDICARE OBSERVATION STATUS NOTIFICATION   Patient Details  Name: [name] MRN: [id number] Date of Birth: Jun 18, 1935   Medicare Observation Status Notification Given:  Yes    Gala Lewandowsky, RN 07/26/2020, 4:06 PM

## 2020-07-26 NOTE — Progress Notes (Addendum)
Progress Note  [name] Date of Encounter: 07/26/2020  [facility] Cardiologist: Lewayne Bunting, MD   Subjective   Had a couple of minor episodes of chest pressure last pm, pain-free now. No SOB  Inpatient Medications    Scheduled Meds:  aspirin EC  81 mg Oral Daily   cholecalciferol  2,000 Units Oral Daily   dorzolamide-timolol  1 drop Both Eyes BID   isosorbide mononitrate  30 mg Oral Daily   latanoprost  1 drop Both Eyes QHS   metoprolol tartrate  75 mg Oral BID   pantoprazole  40 mg Oral Daily   potassium chloride SA  20 mEq Oral Daily   regadenoson       regadenoson  0.4 mg Intravenous Once   Continuous Infusions:  PRN Meds: acetaminophen, nitroGLYCERIN, ondansetron (ZOFRAN) IV   Vital Signs    Vitals:   07/25/20 2238 07/25/20 2241 07/26/20 0507 07/26/20 0931  BP: 132/65 132/65 (!) 151/87 (!) 167/110  Pulse: 60 74 66   Resp:   18   Temp:   97.7 F (36.5 C)   TempSrc:   Oral   SpO2: 95%  97%   Weight:   72.2 kg   Height:        Intake/Output Summary (Last 24 hours) at 07/26/2020 0945 Last data filed at 07/26/2020 0700 Gross per 24 hour  Intake --  Output 100 ml  Net -100 ml   Last 3 Weights 07/26/2020 07/25/2020 05/23/2020  Weight (lbs) 159 lb 3.2 oz 166 lb 14.2 oz 166 lb 14.4 oz  Weight (kg) 72.213 kg 75.7 kg 75.705 kg      Telemetry    Atrial fib, RBBB - Personally Reviewed  ECG    07/07 ECG is atrial fib, RBBB - Personally Reviewed  Physical Exam   GEN: No acute distress.   Neck: No JVD Cardiac: RRR, +valve click, soft murmur, no rubs, or gallops.  Respiratory: Clear to auscultation bilaterally. GI: Soft, nontender, non-distended  MS: No edema; No deformity. Neuro:  Nonfocal  Psych: Normal affect   Labs    High Sensitivity Troponin:   Recent Labs  Lab 07/25/20 0954 07/25/20 1154 07/25/20 1651 07/25/20 2129  TROPONINIHS 14 20* 24* 23*      Chemistry Recent Labs  Lab 07/25/20 0954 07/26/20 0515  NA 138 140   K 3.3* 3.6  CL 104 104  CO2 25 29  GLUCOSE 105* 106*  BUN 22 21  CREATININE 1.53* 1.49*  CALCIUM 9.7 9.7  PROT 8.3*  --   ALBUMIN 4.1  --   AST 22  --   ALT 16  --   ALKPHOS 57  --   BILITOT 1.0  --   GFRNONAA 34* 35*  ANIONGAP 9 7     Hematology Recent Labs  Lab 07/25/20 0954 07/26/20 0515  WBC 5.4 5.3  RBC 4.84 4.37  HGB 13.1 11.8*  HCT 43.1 38.0  MCV 89.0 87.0  MCH 27.1 27.0  MCHC 30.4 31.1  RDW 14.5 14.3  PLT 187 184   Lab Results  Component Value Date   TSH 1.967 07/25/2020   Lab Results  Component Value Date   HGBA1C 6.2 (H) 07/25/2020   Lab Results  Component Value Date   CHOL 180 07/26/2020   HDL 58 07/26/2020   LDLCALC 98 07/26/2020   LDLDIRECT 100 (H) 06/10/2017   TRIG 118 07/26/2020   CHOLHDL 3.1 07/26/2020   Lab Results  Component Value Date  INR 2.6 (H) 07/26/2020   INR 2.6 (H) 07/25/2020   INR 2.7 (H) 07/25/2020   PROTIME 18.5 06/21/2008   Digoxin Level  Date Value Ref Range Status  06/01/2015 0.7 (L) 0.8 - 2.0 ng/mL Final  05/23/2015 1.3 0.8 - 2.0 ng/mL Final  05/23/2015 1.2 0.8 - 2.0 ng/mL Final    BNPNo results for input(s): BNP, PROBNP in the last 168 hours.   DDimer No results for input(s): DDIMER in the last 168 hours.   Radiology    DG Chest 1 View  Result Date: 07/25/2020 CLINICAL DATA:  Chest pain.  Shortness of breath. EXAM: CHEST  1 VIEW COMPARISON:  06/01/2015. FINDINGS: Prior CABG. Stable cardiomegaly. No pulmonary venous congestion. Low lung volumes with mild right base atelectasis. Tiny left pleural effusion versus pleural scarring. No pneumothorax. IMPRESSION: 1. Prior CABG. Stable cardiomegaly. No pulmonary venous congestion. 2. Low lung volumes with mild right base atelectasis. Tiny left pleural effusion versus pleural scarring. Electronically Signed   By: Maisie Fus  Register   On: 07/25/2020 10:44   ECHOCARDIOGRAM COMPLETE  Result Date: 07/25/2020    ECHOCARDIOGRAM REPORT   Patient Name:   [name] Date of  Exam: 07/25/2020 Medical Rec #:  [id number]      Height:       68.0 in Accession #:    [id number]     Weight:       166.9 lb Date of Birth:  1935-09-05      BSA:          1.892 m Patient Age:    84 years       BP:           179/100 mmHg Patient Gender: F              HR:           87 bpm. Exam Location:  Inpatient Procedure: 2D Echo, Cardiac Doppler, Color Doppler and Intracardiac            Opacification Agent Indications:    R07.9* Chest pain, unspecified  History:        Patient has prior history of Echocardiogram examinations, most                 recent 06/01/2017. Cardiomyopathy, Arrythmias:Atrial Fibrillation                 and RBBB; Risk Factors:Hypertension. Rheumatic Mitral Valve                 Disease. GERD.                  Mitral Valve: mechanical valve valve is present in the mitral                 position. Procedure Date: 65.  Sonographer:    Tiffany Dance Referring Phys: [id number] Manson Passey IMPRESSIONS  1. Left ventricular ejection fraction, by estimation, is 50 to 55%. The left ventricle has low normal function. The left ventricle demonstrates regional wall motion abnormalities (see scoring diagram/findings for description). There is moderate concentric left ventricular hypertrophy. Left ventricular diastolic function could not be evaluated. There is moderate hypokinesis of the left ventricular, mid-apical inferolateral wall and inferior wall. There is mild hypokinesis of the left ventricular, entire anterolateral wall.  2. Right ventricular systolic function is mildly reduced. The right ventricular size is normal. There is normal pulmonary artery systolic pressure.  3. Left atrial size was severely dilated.  4. Right atrial  size was severely dilated.  5. The mitral valve has been repaired/replaced. not evaluated mitral valve regurgitation. There is a mechanical valve present in the mitral position. Procedure Date: 10. Echo findings are consistent with normal structure and function of the  mitral valve prosthesis.  6. Tricuspid valve regurgitation is mild to moderate.  7. The aortic valve is calcified. There is mild calcification of the aortic valve. Aortic valve regurgitation is mild. Mild aortic valve stenosis.  8. The inferior vena cava is normal in size with greater than 50% respiratory variability, suggesting right atrial pressure of 3 mmHg. Comparison(s): Prior images reviewed side by side. Changes from prior study are noted. Wall motion better seen on current study with echo contrast. Conclusion(s)/Recommendation(s): Low normal EF with wall motion abnormalities as noted. Wall motion changes do not appear completely new, especially in inferior/inferolateral portions, but better seen on current study. Mild anterolateral wall hypokinesis  appreciated on current study. FINDINGS  Left Ventricle: Left ventricular ejection fraction, by estimation, is 50 to 55%. The left ventricle has low normal function. The left ventricle demonstrates regional wall motion abnormalities. Moderate hypokinesis of the left ventricular, mid-apical inferolateral wall and inferior wall. Mild hypokinesis of the left ventricular, entire anterolateral wall. Definity contrast agent was given IV to delineate the left ventricular endocardial borders. The left ventricular internal cavity size was normal in  size. There is moderate concentric left ventricular hypertrophy. Abnormal (paradoxical) septal motion consistent with post-operative status. Left ventricular diastolic function could not be evaluated. Right Ventricle: The right ventricular size is normal. Right vetricular wall thickness was not well visualized. Right ventricular systolic function is mildly reduced. There is normal pulmonary artery systolic pressure. The tricuspid regurgitant velocity is 2.31 m/s, and with an assumed right atrial pressure of 3 mmHg, the estimated right ventricular systolic pressure is 24.3 mmHg. Left Atrium: Left atrial size was severely  dilated. Right Atrium: Right atrial size was severely dilated. Pericardium: There is no evidence of pericardial effusion. Mitral Valve: The mitral valve has been repaired/replaced. Not evaluated mitral valve regurgitation. There is a mechanical valve present in the mitral position. Procedure Date: 53. Echo findings are consistent with normal structure and function of the mitral valve prosthesis. Tricuspid Valve: The tricuspid valve is grossly normal. Tricuspid valve regurgitation is mild to moderate. No evidence of tricuspid stenosis. Aortic Valve: The aortic valve is calcified. There is mild calcification of the aortic valve. Aortic valve regurgitation is mild. Aortic regurgitation PHT measures 316 msec. Mild aortic stenosis is present. Aortic valve mean gradient measures 12.0 mmHg. Aortic valve peak gradient measures 25.4 mmHg. Aortic valve area, by VTI measures 0.74 cm. Pulmonic Valve: The pulmonic valve was not well visualized. Pulmonic valve regurgitation is not visualized. Aorta: The aortic root, ascending aorta and aortic arch are all structurally normal, with no evidence of dilitation or obstruction. Venous: The inferior vena cava is normal in size with greater than 50% respiratory variability, suggesting right atrial pressure of 3 mmHg. IAS/Shunts: The interatrial septum was not well visualized.  LEFT VENTRICLE PLAX 2D LVIDd:         4.08 cm LVIDs:         2.90 cm LV PW:         1.70 cm LV IVS:        1.70 cm LVOT diam:     1.70 cm LV SV:         37 LV SV Index:   19 LVOT Area:     2.27 cm  RIGHT VENTRICLE          IVC RV Basal diam:  3.50 cm  IVC diam: 2.00 cm RV Mid diam:    2.50 cm TAPSE (M-mode): 1.6 cm LEFT ATRIUM              Index        RIGHT ATRIUM           Index LA diam:        5.00 cm  2.64 cm/m   RA Area:     27.60 cm LA Vol (A2C):   199.0 ml 105.16 ml/m RA Volume:   92.50 ml  48.88 ml/m LA Vol (A4C):   115.0 ml 60.77 ml/m LA Biplane Vol: 159.0 ml 84.02 ml/m  AORTIC VALVE AV Area  (Vmax):    0.74 cm AV Area (Vmean):   0.78 cm AV Area (VTI):     0.74 cm AV Vmax:           252.00 cm/s AV Vmean:          159.333 cm/s AV VTI:            0.497 m AV Peak Grad:      25.4 mmHg AV Mean Grad:      12.0 mmHg LVOT Vmax:         81.70 cm/s LVOT Vmean:        54.650 cm/s LVOT VTI:          0.162 m LVOT/AV VTI ratio: 0.33 AI PHT:            316 msec  AORTA Ao Root diam: 3.00 cm Ao Asc diam:  2.80 cm MITRAL VALVE                TRICUSPID VALVE MV Area (PHT): 3.85 cm     TR Peak grad:   21.3 mmHg MV Decel Time: 197 msec     TR Vmax:        231.00 cm/s MV E velocity: 144.00 cm/s                             SHUNTS                             Systemic VTI:  0.16 m                             Systemic Diam: 1.70 cm Jodelle RedBridgette Christopher MD Electronically signed by Jodelle RedBridgette Christopher MD Signature Date/Time: 07/25/2020/6:30:06 PM    Final     Cardiac Studies   ECHO: 07/25/2020 1. Left ventricular ejection fraction, by estimation, is 50 to 55%. The left ventricle has low normal function. The left ventricle demonstrates regional wall motion abnormalities (see scoring diagram/findings for description). There is moderate  concentric left ventricular hypertrophy. Left ventricular diastolic  function could not be evaluated. There is moderate hypokinesis of the left ventricular, mid-apical inferolateral wall and inferior wall. There is mild hypokinesis of the left ventricular, entire anterolateral wall.   2. Right ventricular systolic function is mildly reduced. The right  ventricular size is normal. There is normal pulmonary artery systolic pressure.   3. Left atrial size was severely dilated.   4. Right atrial size was severely dilated.   5. The mitral valve has been repaired/replaced. not evaluated mitral valve regurgitation. There is a  mechanical valve present in the mitral position. Procedure Date: 71. Echo findings are consistent with normal structure and function of the mitral  valve prosthesis.    6. Tricuspid valve regurgitation is mild to moderate.   7. The aortic valve is calcified. There is mild calcification of the  aortic valve. Aortic valve regurgitation is mild. Mild aortic valve  stenosis.   8. The inferior vena cava is normal in size with greater than 50%  respiratory variability, suggesting right atrial pressure of 3 mmHg.   Comparison(s): Prior images reviewed side by side. Changes from prior study are noted. Wall motion better seen on current study with echo contrast.   Patient Profile     85 y.o. female with a hx of St Jude mech MVR [phone], chronic coumadin, chronic afib, chronic diastolic CHF with venous stasis, HTN, mild aortic stenosis, GERD, NSVT, anemia, RBBB  and CKD III (baseline Scr 1.3-1.5) who was admitted 7/6 for chest pain.  Assessment & Plan    1.  Chest pain: -Mild elevation in cardiac enzymes is not consistent with non-STEMI - currently pain-free - echo w/ +WMA but no sig change from 2017 - Dr Elease Hashimoto reviewed all data, feels ok to do MV today  2. S/p mech MV - INR is therapeutic - functioning well on echo  3. HTN - per pt, BP is generally high, up and down at home - SBP generally > 170 w/ DBP > 100  - BP very high on admission, up to 199/111 - today, 188/108 - pt on home dose of Lopressor 75 mg bid - not on home dose Cardizem 240 mg qd - discuss changing Cardizem to amlodipine w/ MD  4. Perm Afib on coumadin - Lopressor and Cardizem for rate control, doses above - no bradycardia seen - on digoxin 0.125 mg qod as well - no dig level since 2017, will order - HR controlled on Lopressor 75 mg bid here - med changes per MD   For questions or updates, please contact [facility] Please consult [email or web address] for contact info under        Signed, Theodore Demark, PA-C  07/26/2020, 9:45 AM    Attending Note:   The patient was seen and examined.  Agree with assessment and plan as noted above.  Changes made to the above note as  needed.  Patient seen and independently examined with Theodore Demark, PA .   We discussed all aspects of the encounter. I agree with the assessment and plan as stated above.     Chest pain :   myoview showed a fixed inferior scar.   No reversible ischemia ejection fraction is 50%.  No need for cath  2.  Mechanical MVR:   will cover with heparin tonight.  Restart coumadin - she has missed 1-2 doses.  Anticipate DC tomorrow if her INR is ok.     I have spent a total of 40 minutes with patient reviewing hospital  notes , telemetry, EKGs, labs and examining patient as well as establishing an assessment and plan that was discussed with the patient.  > 50% of time was spent in direct patient care.    Vesta Mixer, Montez Hageman., MD, [facility] 07/26/2020, 4:31 PM [address]  [address] Office [phone] Pager [phone]

## 2020-07-27 ENCOUNTER — Other Ambulatory Visit: Payer: Self-pay | Admitting: Physician Assistant

## 2020-07-27 DIAGNOSIS — N183 Chronic kidney disease, stage 3 unspecified: Secondary | ICD-10-CM

## 2020-07-27 DIAGNOSIS — I200 Unstable angina: Secondary | ICD-10-CM | POA: Diagnosis not present

## 2020-07-27 DIAGNOSIS — R079 Chest pain, unspecified: Secondary | ICD-10-CM | POA: Diagnosis not present

## 2020-07-27 LAB — CBC
HCT: 38.5 % (ref 36.0–46.0)
Hemoglobin: 12.5 g/dL (ref 12.0–15.0)
MCH: 28 pg (ref 26.0–34.0)
MCHC: 32.5 g/dL (ref 30.0–36.0)
MCV: 86.3 fL (ref 80.0–100.0)
Platelets: 166 10*3/uL (ref 150–400)
RBC: 4.46 MIL/uL (ref 3.87–5.11)
RDW: 14.5 % (ref 11.5–15.5)
WBC: 5.8 10*3/uL (ref 4.0–10.5)
nRBC: 0 % (ref 0.0–0.2)

## 2020-07-27 LAB — PROTIME-INR
INR: 2.7 — ABNORMAL HIGH (ref 0.8–1.2)
Prothrombin Time: 28.9 seconds — ABNORMAL HIGH (ref 11.4–15.2)

## 2020-07-27 LAB — HEPARIN LEVEL (UNFRACTIONATED): Heparin Unfractionated: 0.56 IU/mL (ref 0.30–0.70)

## 2020-07-27 MED ORDER — WARFARIN SODIUM 10 MG PO TABS: 5.0000 mg | ORAL_TABLET | Freq: Every evening | ORAL | Status: DC

## 2020-07-27 MED ORDER — ISOSORBIDE MONONITRATE ER 30 MG PO TB24: 30.0000 mg | ORAL_TABLET | Freq: Every day | ORAL | 1 refills | Status: DC

## 2020-07-27 MED ORDER — POLYETHYLENE GLYCOL 3350 17 G PO PACK: 17.0000 g | PACK | Freq: Every day | ORAL | Status: DC | PRN

## 2020-07-27 MED ORDER — WARFARIN SODIUM 5 MG PO TABS: 10.0000 mg | ORAL_TABLET | Freq: Once | ORAL | Status: DC

## 2020-07-27 MED ORDER — DILTIAZEM HCL ER COATED BEADS 240 MG PO CP24
240.0000 mg | ORAL_CAPSULE | Freq: Every day | ORAL | Status: DC
  Administered 2020-07-27: 240 mg via ORAL
  Filled 2020-07-27: qty 1

## 2020-07-27 NOTE — Progress Notes (Addendum)
I discussed / reviewed the pharmacy note by Dr. Alvino Chapel and I agree with the resident's findings and plans as documented.  Agree with plan below. Will give warfarin 10 mg tonight and then resume PTA dosing starting tomorrow. Plan for INR check later next week.   Sherron Monday, PharmD, BCCCP Clinical Pharmacist  Phone: [phone] 07/27/2020 3:15 PM  Please check AMION for all [facility] phone numbers After 10:00 PM, call Main Pharmacy [phone]    ANTICOAGULATION CONSULT NOTE - Follow Up Consult  Pharmacy Consult for IV Heparin >> Warfarin Indication:  Mechanical Mitral Valve, Atrial Fibrillation  Allergies  Allergen Reactions   Esomeprazole Magnesium     REACTION: stomach upset, nausea    Patient Measurements: Height: 5' 7.99" (172.7 cm) Weight: 71 kg (156 lb 9.6 oz) IBW/kg (Calculated) : 63.88 Heparin Dosing Weight: 75.7 kg  Vital Signs: Temp: 98.6 F (37 C) (07/08 0525) Temp Source: Oral (07/08 0525) BP: 172/98 (07/08 0804) Pulse Rate: 72 (07/08 0804)  Labs: Recent Labs    07/25/20 0954 07/25/20 1154 07/25/20 1651 07/25/20 2129 07/26/20 0515 07/26/20 1350 07/27/20 0351  HGB 13.1  --   --   --  11.8*  --  12.5  HCT 43.1  --   --   --  38.0  --  38.5  PLT 187  --   --   --  184  --  166  APTT 37*  --   --   --   --   --   --   LABPROT 28.3*  --   --  28.1* 27.9* 26.1* 28.9*  INR 2.7*  --   --  2.6* 2.6* 2.4* 2.7*  HEPARINUNFRC  --   --   --   --   --   --  0.56  CREATININE 1.53*  --   --   --  1.49*  --   --   TROPONINIHS 14 20* 24* 23*  --   --   --     Estimated Creatinine Clearance: 29.4 mL/min (A) (by C-G formula based on SCr of 1.49 mg/dL (H)).  Assessment: 85 yr old female with mechanical mitral valve, on chronic warfarin (INR at anticoagulation clinic on 7/5 was 3.2, INR this morning is 2.7); CHF, HTN, mild aortic stenosis, GERD, and CKD stage III.   Home regimen of warfarin is 5 mg every day except 10 mg on MWF.  INR therapeutic at 2.7 (heparin  discontinued). Hgb 12.5, Hct 38.5, Plt 166 - stable. No s/sx bleeding noted per RN.   Goal of Therapy:  Heparin level 0.3-0.7 units/ml INR goal: 2.5-3.5 Monitor platelets by anticoagulation protocol: Yes   Plan:  Warfarin 10 mg PO x 1 today, then resume home regimen (plan for discharge today). F/u with INR check and monitor for s/sx of bleeding.  Jerrilyn Cairo, PharmD PGY1 Resident Phone: [phone] 07/27/2020 8:43 AM

## 2020-07-27 NOTE — Progress Notes (Addendum)
Progress Note  Patient Name: TAMIRAH GEORGE Date of Encounter: 07/27/2020  [facility] Cardiologist: Lewayne Bunting, MD   Subjective   Denies any CP or SOB.   Inpatient Medications    Scheduled Meds:  aspirin EC  81 mg Oral Daily   cholecalciferol  2,000 Units Oral Daily   dorzolamide-timolol  1 drop Both Eyes BID   isosorbide mononitrate  30 mg Oral Daily   latanoprost  1 drop Both Eyes QHS   metoprolol tartrate  75 mg Oral BID   pantoprazole  40 mg Oral Daily   potassium chloride SA  20 mEq Oral Daily   Warfarin - Pharmacist Dosing Inpatient   Does not apply q1600   Continuous Infusions:  PRN Meds: acetaminophen, nitroGLYCERIN, ondansetron (ZOFRAN) IV   Vital Signs    Vitals:   07/26/20 1742 07/26/20 2044 07/27/20 0500 07/27/20 0525  BP: (!) 145/94 (!) 150/97    Pulse: 81     Resp: 17     Temp: 97.7 F (36.5 C) 98 F (36.7 C)  98.6 F (37 C)  TempSrc:  Oral  Oral  SpO2: 99%     Weight:   71 kg   Height:        Intake/Output Summary (Last 24 hours) at 07/27/2020 0805 Last data filed at 07/27/2020 0407 Gross per 24 hour  Intake 106.2 ml  Output --  Net 106.2 ml   Last 3 Weights 07/27/2020 07/26/2020 07/25/2020  Weight (lbs) 156 lb 9.6 oz 159 lb 3.2 oz 166 lb 14.2 oz  Weight (kg) 71.033 kg 72.213 kg 75.7 kg      Telemetry    Atrial fibrillation with HR 80s - Personally Reviewed  ECG    Atrial fibrillation with RBBB - Personally Reviewed  Physical Exam   GEN: No acute distress.   Neck: No JVD Cardiac: irregularly irregular, no murmurs, rubs, or gallops.  Respiratory: Clear to auscultation bilaterally. GI: Soft, nontender, non-distended  MS: No edema; No deformity. Neuro:  Nonfocal  Psych: Normal affect   Labs    High Sensitivity Troponin:   Recent Labs  Lab 07/25/20 0954 07/25/20 1154 07/25/20 1651 07/25/20 2129  TROPONINIHS 14 20* 24* 23*      Chemistry Recent Labs  Lab 07/25/20 0954 07/26/20 0515  NA 138 140  K 3.3* 3.6  CL 104  104  CO2 25 29  GLUCOSE 105* 106*  BUN 22 21  CREATININE 1.53* 1.49*  CALCIUM 9.7 9.7  PROT 8.3*  --   ALBUMIN 4.1  --   AST 22  --   ALT 16  --   ALKPHOS 57  --   BILITOT 1.0  --   GFRNONAA 34* 35*  ANIONGAP 9 7     Hematology Recent Labs  Lab 07/25/20 0954 07/26/20 0515 07/27/20 0351  WBC 5.4 5.3 5.8  RBC 4.84 4.37 4.46  HGB 13.1 11.8* 12.5  HCT 43.1 38.0 38.5  MCV 89.0 87.0 86.3  MCH 27.1 27.0 28.0  MCHC 30.4 31.1 32.5  RDW 14.5 14.3 14.5  PLT 187 184 166    BNPNo results for input(s): BNP, PROBNP in the last 168 hours.   DDimer No results for input(s): DDIMER in the last 168 hours.   Radiology    DG Chest 1 View  Result Date: 07/25/2020 CLINICAL DATA:  Chest pain.  Shortness of breath. EXAM: CHEST  1 VIEW COMPARISON:  06/01/2015. FINDINGS: Prior CABG. Stable cardiomegaly. No pulmonary venous congestion. Low lung volumes  with mild right base atelectasis. Tiny left pleural effusion versus pleural scarring. No pneumothorax. IMPRESSION: 1. Prior CABG. Stable cardiomegaly. No pulmonary venous congestion. 2. Low lung volumes with mild right base atelectasis. Tiny left pleural effusion versus pleural scarring. Electronically Signed   By: Maisie Fus  Register   On: 07/25/2020 10:44   NM Myocar Multi W/Spect W/Wall Motion / EF  Result Date: 07/26/2020 CLINICAL DATA:  Chest pain EXAM: MYOCARDIAL IMAGING WITH SPECT (REST AND PHARMACOLOGIC-STRESS) GATED LEFT VENTRICULAR WALL MOTION STUDY LEFT VENTRICULAR EJECTION FRACTION TECHNIQUE: Standard myocardial SPECT imaging was performed after resting intravenous injection of 10.8 mCi Tc-20m Myoview. Subsequently, intravenous infusion of Lexiscan was performed under the supervision of the Cardiology staff. At peak effect of the drug, 32 mCi Tc-8m Myoview was injected intravenously and standard myocardial SPECT imaging was performed. Quantitative gated imaging was also performed to evaluate left ventricular wall motion, and estimate left  ventricular ejection fraction. COMPARISON:  06/02/2015 FINDINGS: Perfusion: Persistent region of decreased activity in the inferior wall of left ventricular myocardium on both stress and resting studies, as was seen previously. No reversible defect in the left ventricle on stress imaging to suggest reversible ischemia . Wall Motion: Normal left ventricular wall motion including inferior wall. No left ventricular dilation. Left Ventricular Ejection Fraction: 50 % End diastolic volume 88 ml End systolic volume 44 ml IMPRESSION: 1. Negative for ischemia. Chronic inferior wall fixed defect favor attenuation over scar. 2. Normal left ventricular wall motion. 3. Left ventricular ejection fraction 50% 4. Non invasive risk stratification*: Low *2012 Appropriate Use Criteria for Coronary Revascularization Focused Update: J Am Coll Cardiol. 2012;59(9):857-881. Http://content.dementiazones.com.aspx?articleid=[email or web address] Electronically Signed   By: Corlis Leak M.D.   On: 07/26/2020 12:08   ECHOCARDIOGRAM COMPLETE  Result Date: 07/25/2020    ECHOCARDIOGRAM REPORT   Patient Name:   DALY WHIPKEY Date of Exam: 07/25/2020 Medical Rec #:  [id number]      Height:       68.0 in Accession #:    [id number]     Weight:       166.9 lb Date of Birth:  Jan 27, 1936      BSA:          1.892 m Patient Age:    85 years       BP:           179/100 mmHg Patient Gender: F              HR:           87 bpm. Exam Location:  Inpatient Procedure: 2D Echo, Cardiac Doppler, Color Doppler and Intracardiac            Opacification Agent Indications:    R07.9* Chest pain, unspecified  History:        Patient has prior history of Echocardiogram examinations, most                 recent 06/01/2017. Cardiomyopathy, Arrythmias:Atrial Fibrillation                 and RBBB; Risk Factors:Hypertension. Rheumatic Mitral Valve                 Disease. GERD.                  Mitral Valve: mechanical valve valve is present in the mitral                 position.  Procedure Date: 69.  Sonographer:  Tiffany Dance Referring Phys: [id number] Sharrell Ku BHAGAT IMPRESSIONS  1. Left ventricular ejection fraction, by estimation, is 50 to 55%. The left ventricle has low normal function. The left ventricle demonstrates regional wall motion abnormalities (see scoring diagram/findings for description). There is moderate concentric left ventricular hypertrophy. Left ventricular diastolic function could not be evaluated. There is moderate hypokinesis of the left ventricular, mid-apical inferolateral wall and inferior wall. There is mild hypokinesis of the left ventricular, entire anterolateral wall.  2. Right ventricular systolic function is mildly reduced. The right ventricular size is normal. There is normal pulmonary artery systolic pressure.  3. Left atrial size was severely dilated.  4. Right atrial size was severely dilated.  5. The mitral valve has been repaired/replaced. not evaluated mitral valve regurgitation. There is a mechanical valve present in the mitral position. Procedure Date: 73. Echo findings are consistent with normal structure and function of the mitral valve prosthesis.  6. Tricuspid valve regurgitation is mild to moderate.  7. The aortic valve is calcified. There is mild calcification of the aortic valve. Aortic valve regurgitation is mild. Mild aortic valve stenosis.  8. The inferior vena cava is normal in size with greater than 50% respiratory variability, suggesting right atrial pressure of 3 mmHg. Comparison(s): Prior images reviewed side by side. Changes from prior study are noted. Wall motion better seen on current study with echo contrast. Conclusion(s)/Recommendation(s): Low normal EF with wall motion abnormalities as noted. Wall motion changes do not appear completely new, especially in inferior/inferolateral portions, but better seen on current study. Mild anterolateral wall hypokinesis  appreciated on current study. FINDINGS  Left Ventricle: Left  ventricular ejection fraction, by estimation, is 50 to 55%. The left ventricle has low normal function. The left ventricle demonstrates regional wall motion abnormalities. Moderate hypokinesis of the left ventricular, mid-apical inferolateral wall and inferior wall. Mild hypokinesis of the left ventricular, entire anterolateral wall. Definity contrast agent was given IV to delineate the left ventricular endocardial borders. The left ventricular internal cavity size was normal in  size. There is moderate concentric left ventricular hypertrophy. Abnormal (paradoxical) septal motion consistent with post-operative status. Left ventricular diastolic function could not be evaluated. Right Ventricle: The right ventricular size is normal. Right vetricular wall thickness was not well visualized. Right ventricular systolic function is mildly reduced. There is normal pulmonary artery systolic pressure. The tricuspid regurgitant velocity is 2.31 m/s, and with an assumed right atrial pressure of 3 mmHg, the estimated right ventricular systolic pressure is 24.3 mmHg. Left Atrium: Left atrial size was severely dilated. Right Atrium: Right atrial size was severely dilated. Pericardium: There is no evidence of pericardial effusion. Mitral Valve: The mitral valve has been repaired/replaced. Not evaluated mitral valve regurgitation. There is a mechanical valve present in the mitral position. Procedure Date: 69. Echo findings are consistent with normal structure and function of the mitral valve prosthesis. Tricuspid Valve: The tricuspid valve is grossly normal. Tricuspid valve regurgitation is mild to moderate. No evidence of tricuspid stenosis. Aortic Valve: The aortic valve is calcified. There is mild calcification of the aortic valve. Aortic valve regurgitation is mild. Aortic regurgitation PHT measures 316 msec. Mild aortic stenosis is present. Aortic valve mean gradient measures 12.0 mmHg. Aortic valve peak gradient measures  25.4 mmHg. Aortic valve area, by VTI measures 0.74 cm. Pulmonic Valve: The pulmonic valve was not well visualized. Pulmonic valve regurgitation is not visualized. Aorta: The aortic root, ascending aorta and aortic arch are all structurally normal, with no evidence of dilitation or  obstruction. Venous: The inferior vena cava is normal in size with greater than 50% respiratory variability, suggesting right atrial pressure of 3 mmHg. IAS/Shunts: The interatrial septum was not well visualized.  LEFT VENTRICLE PLAX 2D LVIDd:         4.08 cm LVIDs:         2.90 cm LV PW:         1.70 cm LV IVS:        1.70 cm LVOT diam:     1.70 cm LV SV:         37 LV SV Index:   19 LVOT Area:     2.27 cm  RIGHT VENTRICLE          IVC RV Basal diam:  3.50 cm  IVC diam: 2.00 cm RV Mid diam:    2.50 cm TAPSE (M-mode): 1.6 cm LEFT ATRIUM              Index        RIGHT ATRIUM           Index LA diam:        5.00 cm  2.64 cm/m   RA Area:     27.60 cm LA Vol (A2C):   199.0 ml 105.16 ml/m RA Volume:   92.50 ml  48.88 ml/m LA Vol (A4C):   115.0 ml 60.77 ml/m LA Biplane Vol: 159.0 ml 84.02 ml/m  AORTIC VALVE AV Area (Vmax):    0.74 cm AV Area (Vmean):   0.78 cm AV Area (VTI):     0.74 cm AV Vmax:           252.00 cm/s AV Vmean:          159.333 cm/s AV VTI:            0.497 m AV Peak Grad:      25.4 mmHg AV Mean Grad:      12.0 mmHg LVOT Vmax:         81.70 cm/s LVOT Vmean:        54.650 cm/s LVOT VTI:          0.162 m LVOT/AV VTI ratio: 0.33 AI PHT:            316 msec  AORTA Ao Root diam: 3.00 cm Ao Asc diam:  2.80 cm MITRAL VALVE                TRICUSPID VALVE MV Area (PHT): 3.85 cm     TR Peak grad:   21.3 mmHg MV Decel Time: 197 msec     TR Vmax:        231.00 cm/s MV E velocity: 144.00 cm/s                             SHUNTS                             Systemic VTI:  0.16 m                             Systemic Diam: 1.70 cm Jodelle Red MD Electronically signed by Jodelle Red MD Signature Date/Time:  07/25/2020/6:30:06 PM    Final     Cardiac Studies   1. Left ventricular ejection fraction, by estimation, is 50 to 55%. The  left ventricle has low normal function.  The left ventricle demonstrates  regional wall motion abnormalities (see scoring diagram/findings for  description). There is moderate  concentric left ventricular hypertrophy. Left ventricular diastolic  function could not be evaluated. There is moderate hypokinesis of the left  ventricular, mid-apical inferolateral wall and inferior wall. There is  mild hypokinesis of the left  ventricular, entire anterolateral wall.   2. Right ventricular systolic function is mildly reduced. The right  ventricular size is normal. There is normal pulmonary artery systolic  pressure.   3. Left atrial size was severely dilated.   4. Right atrial size was severely dilated.   5. The mitral valve has been repaired/replaced. not evaluated mitral  valve regurgitation. There is a mechanical valve present in the mitral  position. Procedure Date: 47. Echo findings are consistent with normal  structure and function of the mitral  valve prosthesis.   6. Tricuspid valve regurgitation is mild to moderate.   7. The aortic valve is calcified. There is mild calcification of the  aortic valve. Aortic valve regurgitation is mild. Mild aortic valve  stenosis.   8. The inferior vena cava is normal in size with greater than 50%  respiratory variability, suggesting right atrial pressure of 3 mmHg.   Comparison(s): Prior images reviewed side by side. Changes from prior  study are noted. Wall motion better seen on current study with echo  contrast.   Conclusion(s)/Recommendation(s): Low normal EF with wall motion  abnormalities as noted. Wall motion changes do not appear completely new,  especially in inferior/inferolateral portions, but better seen on current  study. Mild anterolateral wall hypokinesis   appreciated on current study.    Myoview  07/26/2020 IMPRESSION: 1. Negative for ischemia. Chronic inferior wall fixed defect favor attenuation over scar.   2. Normal left ventricular wall motion.   3. Left ventricular ejection fraction 50%   4. Non invasive risk stratification*: Low   Patient Profile     85 y.o. 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   - myoview 07/26/2020 shows EF 50%, chronic inferior wall fixed defect, no ischemia.    S/p mechanical MV: INR stable at 2/7, plan for discharge today  HTN: on metoprolol 75mg  BID. Imdur 30mg  added during this admission. BP remain high, hold dose of HCTZ and Cardizem are held. Will restart home Cardizem. HR currently in the 80s.   Permanent atrial fibrillation on coumadin   For questions or updates, please contact [facility] Please consult [email or web address] for contact info under        Signed, , PA  07/27/2020, 8:05 AM     Attending Note:   The patient was seen and examined.  Agree with assessment and plan as noted above.  Changes made to the above note as needed.  Patient seen and independently examined with Azalee Course, PA .   We discussed all aspects of the encounter. I agree with the assessment and plan as stated above.     Chest pain :   myoview shows no ischemia .   Has a fixed scar.  No further cp  2.  Mechanical MVR:  in r is 2.7 Continue home dose of warfarin and follow up with our coumadin clinic  3.  Htn:   restarting home meds .  Follow up in the office    I have spent a total of 40 minutes with patient reviewing hospital  notes ,  telemetry, EKGs, labs and examining patient as well as establishing an assessment and plan that was discussed with the patient.  > 50% of time was spent in direct patient care.    Vesta MixerPhilip J. Knolan Simien, Montez HagemanJr., MD, [facility] Oaks Medical CenterFACC 07/27/2020, 10:53 AM [address],  [address] Office [phone] Pager [phone]

## 2020-07-27 NOTE — Discharge Summary (Addendum)
Discharge Summary    Patient ID: [name] MRN: [id number]; DOB: 02/11/1935  Admit date: 07/25/2020 Discharge date: 07/27/2020  PCP:  Nestor RampNeal, Sara L, MD   [facility] Providers Cardiologist:  Lewayne BuntingGregg Taylor, MD      Discharge Diagnoses    Principal Problem:   Unstable angina [facility](HCC) Active Problems:   HYPERTENSION, BENIGN SYSTEMIC   RBBB   History of mitral valve replacement - St Jude mechanical valve   Long term current use of anticoagulant therapy   CKD (chronic kidney disease), stage III (HCC)   Chronic atrial fibrillation (HCC)   Heart failure (HCC)    Diagnostic Studies/Procedures    1. Left ventricular ejection fraction, by estimation, is 50 to 55%. The  left ventricle has low normal function. The left ventricle demonstrates  regional wall motion abnormalities (see scoring diagram/findings for  description). There is moderate  concentric left ventricular hypertrophy. Left ventricular diastolic  function could not be evaluated. There is moderate hypokinesis of the left  ventricular, mid-apical inferolateral wall and inferior wall. There is  mild hypokinesis of the left  ventricular, entire anterolateral wall.   2. Right ventricular systolic function is mildly reduced. The right  ventricular size is normal. There is normal pulmonary artery systolic  pressure.   3. Left atrial size was severely dilated.   4. Right atrial size was severely dilated.   5. The mitral valve has been repaired/replaced. not evaluated mitral  valve regurgitation. There is a mechanical valve present in the mitral  position. Procedure Date: 371987. Echo findings are consistent with normal  structure and function of the mitral  valve prosthesis.   6. Tricuspid valve regurgitation is mild to moderate.   7. The aortic valve is calcified. There is mild calcification of the  aortic valve. Aortic valve regurgitation is mild. Mild aortic valve  stenosis.   8. The inferior vena cava is normal in  size with greater than 50%  respiratory variability, suggesting right atrial pressure of 3 mmHg.   Comparison(s): Prior images reviewed side by side. Changes from prior  study are noted. Wall motion better seen on current study with echo  contrast.   Conclusion(s)/Recommendation(s): Low normal EF with wall motion  abnormalities as noted. Wall motion changes do not appear completely new,  especially in inferior/inferolateral portions, but better seen on current  study. Mild anterolateral wall hypokinesis   appreciated on current study.      Myoview 07/26/2020 IMPRESSION: 1. Negative for ischemia. Chronic inferior wall fixed defect favor attenuation over scar.   2. Normal left ventricular wall motion.   3. Left ventricular ejection fraction 50%   4. Non invasive risk stratification*: Low   _____________   History of Present Illness     Tami MatterSally M Hawe is a 85 y.o. female with a hx of St Jude mech MVR [phone], chronic coumadin, chronic afib, chronic diastolic CHF with venous stasis, HTN, mild aortic stenosis, GERD, NSVT, anemia, RBBB  and CKD III (baseline Scr 1.3-1.5) who is being seen 07/25/2020 for the evaluation of Chest pain at the request of Dr. Donnald GarrePfeiffer.   Stress test 05/2015: Low risk without evidence of ischemia. Echo 05/2017: LVEF 50-55%, there is hypokinesis of the inferolateral and inferior  Myocardium, mild AS with mean gradient of 14mm Hg, normal functioning mitral valve.   She was going well on cardiac stand point when last seen by Dr. Ladona Ridgelaylor 07/2019.   Seen by Dr. Chestine Sporelark 01/24/20 for lower extremity pain,swelling etc with concern for  arterial and/or venous disease. ABI of 0.91 on the right and 0.78 on the left. Venous doppler negative for DVT. Venous reflux study does demonstrate incompetence of bilateral greater saphenous veins however only in the saphenofemoral junction and proximal thigh. Recommended compression stocking.   Ms. Schwanz was USOH until last night when had chest  pain. She had really good day yesterday, when for shopping. She went to bed around 8:30 and noted chest discomfort when woke up to use bathroom. Waxing and weaning pain all night. She described her pain as "tight pressure" at lower sternal area. She woke up this morning with worsen pain. Had associated SOB, nausea and palpitations. Nothing make pain worse or better. Each episode lasted for about 10-15 minutes. She received 2 SL nitro with improved pain but returned. EKG with chronic afib and RBBB. Patient reports chronic LE edema and takes HCTZ. BP elevated but hasn't took multiple antihypertensive this morning.   Hs-troponin 14>>20 INR 2.7 SCr 1.53 K 3.3 NA 138 Chest X-ray without acute abnormality   Received additional SL nitro x 1 in ER. Her lower sternal pain has resolved but some epigastric pain on exam. Patient does have hx of GERD but current symptoms are different.   Hospital Course     Consultants: N/A   Patient was admitted to cardiology service.  Serial troponin was borderline elevated however showed a flat pattern.  Echocardiogram obtained on 07/25/2021 showed a low normal EF 50 to 55%, wall motion abnormality present however this does not appears to be new when compared to the previous echo, mechanical mitral valve present, mild AI and aortic stenosis.  She subsequently underwent Myoview on 07/26/2020 that showed EF 50%, chronic inferior wall fixed defect, no ischemia.  She was kept in the hospital for 1 more day to wait for her INR to be therapeutic.  INR was two-point 7 in the morning of 07/27/2020.  She was seen this morning at which time she denies any significant chest discomfort.  Imdur 30 mg daily was added to her medical regimen during this admission. Digoxin was stopped. Blood pressure was high today because her home hydrochlorothiazide and Cardizem were held.  I plan to restart home Cardizem prior to discharge. Will continue to hold HCTZ. She will need BMET in 1 week. I spoke with  our clinical pharmacist who recommend an earlier coumadin clinic visit later next week. I have sent staff message to our scheduler to arrange.    Did the patient have an acute coronary syndrome (MI, NSTEMI, STEMI, etc) this admission?:  No                               Did the patient have a percutaneous coronary intervention (stent / angioplasty)?:  No.       _____________  Discharge Vitals Blood pressure (!) 172/98, pulse 72, temperature 98.6 F (37 C), temperature source Oral, resp. rate 17, height 5' 7.99" (1.727 m), weight 71 kg, SpO2 99 %.  Filed Weights   07/25/20 1400 07/26/20 0507 07/27/20 0500  Weight: 75.7 kg 72.2 kg 71 kg    Labs & Radiologic Studies    CBC Recent Labs    07/26/20 0515 07/27/20 0351  WBC 5.3 5.8  HGB 11.8* 12.5  HCT 38.0 38.5  MCV 87.0 86.3  PLT 184 166   Basic Metabolic Panel Recent Labs    11/91/47 0954 07/26/20 0515  NA 138 140  K 3.3* 3.6  CL 104 104  CO2 25 29  GLUCOSE 105* 106*  BUN 22 21  CREATININE 1.53* 1.49*  CALCIUM 9.7 9.7   Liver Function Tests Recent Labs    07/25/20 0954  AST 22  ALT 16  ALKPHOS 57  BILITOT 1.0  PROT 8.3*  ALBUMIN 4.1   Recent Labs    07/25/20 1223  LIPASE 30   High Sensitivity Troponin:   Recent Labs  Lab 07/25/20 0954 07/25/20 1154 07/25/20 1651 07/25/20 2129  TROPONINIHS 14 20* 24* 23*    BNP Invalid input(s): POCBNP D-Dimer No results for input(s): DDIMER in the last 72 hours. Hemoglobin A1C Recent Labs    07/25/20 1651  HGBA1C 6.2*   Fasting Lipid Panel Recent Labs    07/26/20 0515  CHOL 180  HDL 58  LDLCALC 98  TRIG 118  CHOLHDL 3.1   Thyroid Function Tests Recent Labs    07/25/20 1651  TSH 1.967   _____________  DG Chest 1 View  Result Date: 07/25/2020 CLINICAL DATA:  Chest pain.  Shortness of breath. EXAM: CHEST  1 VIEW COMPARISON:  06/01/2015. FINDINGS: Prior CABG. Stable cardiomegaly. No pulmonary venous congestion. Low lung volumes with mild right  base atelectasis. Tiny left pleural effusion versus pleural scarring. No pneumothorax. IMPRESSION: 1. Prior CABG. Stable cardiomegaly. No pulmonary venous congestion. 2. Low lung volumes with mild right base atelectasis. Tiny left pleural effusion versus pleural scarring. Electronically Signed   By: Maisie Fus  Register   On: 07/25/2020 10:44   NM Myocar Multi W/Spect W/Wall Motion / EF  Result Date: 07/26/2020 CLINICAL DATA:  Chest pain EXAM: MYOCARDIAL IMAGING WITH SPECT (REST AND PHARMACOLOGIC-STRESS) GATED LEFT VENTRICULAR WALL MOTION STUDY LEFT VENTRICULAR EJECTION FRACTION TECHNIQUE: Standard myocardial SPECT imaging was performed after resting intravenous injection of 10.8 mCi Tc-33m Myoview. Subsequently, intravenous infusion of Lexiscan was performed under the supervision of the Cardiology staff. At peak effect of the drug, 32 mCi Tc-66m Myoview was injected intravenously and standard myocardial SPECT imaging was performed. Quantitative gated imaging was also performed to evaluate left ventricular wall motion, and estimate left ventricular ejection fraction. COMPARISON:  06/02/2015 FINDINGS: Perfusion: Persistent region of decreased activity in the inferior wall of left ventricular myocardium on both stress and resting studies, as was seen previously. No reversible defect in the left ventricle on stress imaging to suggest reversible ischemia . Wall Motion: Normal left ventricular wall motion including inferior wall. No left ventricular dilation. Left Ventricular Ejection Fraction: 50 % End diastolic volume 88 ml End systolic volume 44 ml IMPRESSION: 1. Negative for ischemia. Chronic inferior wall fixed defect favor attenuation over scar. 2. Normal left ventricular wall motion. 3. Left ventricular ejection fraction 50% 4. Non invasive risk stratification*: Low *2012 Appropriate Use Criteria for Coronary Revascularization Focused Update: J Am Coll Cardiol. 2012;59(9):857-881.  Http://content.dementiazones.com.aspx?articleid=[email or web address] Electronically Signed   By: Corlis Leak M.D.   On: 07/26/2020 12:08   ECHOCARDIOGRAM COMPLETE  Result Date: 07/25/2020    ECHOCARDIOGRAM REPORT   Patient Name:   LENORA GOMES Date of Exam: 07/25/2020 Medical Rec #:  [id number]      Height:       68.0 in Accession #:    [id number]     Weight:       166.9 lb Date of Birth:  [birth year]      BSA:          1.892 m Patient Age:    84 years       BP:  179/100 mmHg Patient Gender: F              HR:           87 bpm. Exam Location:  Inpatient Procedure: 2D Echo, Cardiac Doppler, Color Doppler and Intracardiac            Opacification Agent Indications:    R07.9* Chest pain, unspecified  History:        Patient has prior history of Echocardiogram examinations, most                 recent 06/01/2017. Cardiomyopathy, Arrythmias:Atrial Fibrillation                 and RBBB; Risk Factors:Hypertension. Rheumatic Mitral Valve                 Disease. GERD.                  Mitral Valve: mechanical valve valve is present in the mitral                 position. Procedure Date: 88.  Sonographer:    Tiffany Dance Referring Phys: [id number] Manson Passey IMPRESSIONS  1. Left ventricular ejection fraction, by estimation, is 50 to 55%. The left ventricle has low normal function. The left ventricle demonstrates regional wall motion abnormalities (see scoring diagram/findings for description). There is moderate concentric left ventricular hypertrophy. Left ventricular diastolic function could not be evaluated. There is moderate hypokinesis of the left ventricular, mid-apical inferolateral wall and inferior wall. There is mild hypokinesis of the left ventricular, entire anterolateral wall.  2. Right ventricular systolic function is mildly reduced. The right ventricular size is normal. There is normal pulmonary artery systolic pressure.  3. Left atrial size was severely dilated.  4. Right atrial size was severely  dilated.  5. The mitral valve has been repaired/replaced. not evaluated mitral valve regurgitation. There is a mechanical valve present in the mitral position. Procedure Date: 56. Echo findings are consistent with normal structure and function of the mitral valve prosthesis.  6. Tricuspid valve regurgitation is mild to moderate.  7. The aortic valve is calcified. There is mild calcification of the aortic valve. Aortic valve regurgitation is mild. Mild aortic valve stenosis.  8. The inferior vena cava is normal in size with greater than 50% respiratory variability, suggesting right atrial pressure of 3 mmHg. Comparison(s): Prior images reviewed side by side. Changes from prior study are noted. Wall motion better seen on current study with echo contrast. Conclusion(s)/Recommendation(s): Low normal EF with wall motion abnormalities as noted. Wall motion changes do not appear completely new, especially in inferior/inferolateral portions, but better seen on current study. Mild anterolateral wall hypokinesis  appreciated on current study. FINDINGS  Left Ventricle: Left ventricular ejection fraction, by estimation, is 50 to 55%. The left ventricle has low normal function. The left ventricle demonstrates regional wall motion abnormalities. Moderate hypokinesis of the left ventricular, mid-apical inferolateral wall and inferior wall. Mild hypokinesis of the left ventricular, entire anterolateral wall. Definity contrast agent was given IV to delineate the left ventricular endocardial borders. The left ventricular internal cavity size was normal in  size. There is moderate concentric left ventricular hypertrophy. Abnormal (paradoxical) septal motion consistent with post-operative status. Left ventricular diastolic function could not be evaluated. Right Ventricle: The right ventricular size is normal. Right vetricular wall thickness was not well visualized. Right ventricular systolic function is mildly reduced. There is  normal pulmonary artery systolic pressure. The  tricuspid regurgitant velocity is 2.31 m/s, and with an assumed right atrial pressure of 3 mmHg, the estimated right ventricular systolic pressure is 24.3 mmHg. Left Atrium: Left atrial size was severely dilated. Right Atrium: Right atrial size was severely dilated. Pericardium: There is no evidence of pericardial effusion. Mitral Valve: The mitral valve has been repaired/replaced. Not evaluated mitral valve regurgitation. There is a mechanical valve present in the mitral position. Procedure Date: 25. Echo findings are consistent with normal structure and function of the mitral valve prosthesis. Tricuspid Valve: The tricuspid valve is grossly normal. Tricuspid valve regurgitation is mild to moderate. No evidence of tricuspid stenosis. Aortic Valve: The aortic valve is calcified. There is mild calcification of the aortic valve. Aortic valve regurgitation is mild. Aortic regurgitation PHT measures 316 msec. Mild aortic stenosis is present. Aortic valve mean gradient measures 12.0 mmHg. Aortic valve peak gradient measures 25.4 mmHg. Aortic valve area, by VTI measures 0.74 cm. Pulmonic Valve: The pulmonic valve was not well visualized. Pulmonic valve regurgitation is not visualized. Aorta: The aortic root, ascending aorta and aortic arch are all structurally normal, with no evidence of dilitation or obstruction. Venous: The inferior vena cava is normal in size with greater than 50% respiratory variability, suggesting right atrial pressure of 3 mmHg. IAS/Shunts: The interatrial septum was not well visualized.  LEFT VENTRICLE PLAX 2D LVIDd:         4.08 cm LVIDs:         2.90 cm LV PW:         1.70 cm LV IVS:        1.70 cm LVOT diam:     1.70 cm LV SV:         37 LV SV Index:   19 LVOT Area:     2.27 cm  RIGHT VENTRICLE          IVC RV Basal diam:  3.50 cm  IVC diam: 2.00 cm RV Mid diam:    2.50 cm TAPSE (M-mode): 1.6 cm LEFT ATRIUM              Index        RIGHT ATRIUM            Index LA diam:        5.00 cm  2.64 cm/m   RA Area:     27.60 cm LA Vol (A2C):   199.0 ml 105.16 ml/m RA Volume:   92.50 ml  48.88 ml/m LA Vol (A4C):   115.0 ml 60.77 ml/m LA Biplane Vol: 159.0 ml 84.02 ml/m  AORTIC VALVE AV Area (Vmax):    0.74 cm AV Area (Vmean):   0.78 cm AV Area (VTI):     0.74 cm AV Vmax:           252.00 cm/s AV Vmean:          159.333 cm/s AV VTI:            0.497 m AV Peak Grad:      25.4 mmHg AV Mean Grad:      12.0 mmHg LVOT Vmax:         81.70 cm/s LVOT Vmean:        54.650 cm/s LVOT VTI:          0.162 m LVOT/AV VTI ratio: 0.33 AI PHT:            316 msec  AORTA Ao Root diam: 3.00 cm Ao Asc diam:  2.80 cm MITRAL VALVE  TRICUSPID VALVE MV Area (PHT): 3.85 cm     TR Peak grad:   21.3 mmHg MV Decel Time: 197 msec     TR Vmax:        231.00 cm/s MV E velocity: 144.00 cm/s                             SHUNTS                             Systemic VTI:  0.16 m                             Systemic Diam: 1.70 cm Jodelle Red MD Electronically signed by Jodelle Red MD Signature Date/Time: 07/25/2020/6:30:06 PM    Final    Disposition   Pt is being discharged home today in good condition.  Follow-up Plans & Appointments     Follow-up Information     Marinus Maw, MD Follow up on 08/29/2020.   Specialty: Cardiology Why: 8:30AM. Cardiology follow up Contact information: [address] [place] [address] [phone]         [facility] Follow up on 08/29/2020.   Specialty: Cardiology Why: 8:15AM. Coumadin clinic visit.  Note, office scheduler will contact you to arrange an sooner coumadin clinic visit for next week. You also need 1 week BMET blood work in our office as well. Contact information: [address] [place] [address] [phone]                 Discharge Medications   Allergies as of 07/27/2020       Reactions   Esomeprazole  Magnesium    REACTION: stomach upset, nausea        Medication List     STOP taking these medications    digoxin 0.125 MG tablet Commonly known as: LANOXIN   hydrochlorothiazide 25 MG tablet Commonly known as: HYDRODIURIL       TAKE these medications    diltiazem 240 MG 24 hr capsule Commonly known as: CARDIZEM CD Take 1 capsule (240 mg total) by mouth daily.   dorzolamidel-timolol 22.3-6.8 MG/ML Soln ophthalmic solution Commonly known as: COSOPT Place 1 drop into both eyes in the morning and at bedtime.   isosorbide mononitrate 30 MG 24 hr tablet Commonly known as: IMDUR Take 1 tablet (30 mg total) by mouth daily. Start taking on: July 28, 2020   Lumigan 0.01 % Soln Generic drug: bimatoprost Place 1 drop into both eyes at bedtime.   metoprolol tartrate 50 MG tablet Commonly known as: LOPRESSOR Take 1.5 tablets (75 mg total) by mouth 2 (two) times daily.   pantoprazole 40 MG tablet Commonly known as: PROTONIX Take 1 tablet (40 mg total) by mouth daily.   polyethylene glycol 17 g packet Commonly known as: MIRALAX / GLYCOLAX Take 17 g by mouth daily as needed for mild constipation.   potassium chloride 10 MEQ tablet Commonly known as: KLOR-CON Take 2 tablets (20 mEq total) by mouth daily. What changed:  how much to take when to take this   Vitamin D 50 MCG (2000 UT) tablet Take 1 tablet (2,000 Units total) by mouth daily.   warfarin 10 MG tablet Commonly known as: COUMADIN Take as directed. If you are unsure how to take this  medication, talk to your nurse or doctor. Original instructions: Take 0.5-1 tablets (5-10 mg total) by mouth every evening.  Monday, Wednesday, and Friday,  on Tuesday, Thursday, Saturday and Sunday. What changed: See the new instructions.           Outstanding Labs/Studies   BMET lab in 1 week Office scheduler will contact you to arrange lab and also earlier coumadin clinic  Duration of Discharge Encounter    Greater than 30 minutes including physician time.  Ramond Dial, PA 07/27/2020, 11:50 AM   Attending Note:   The patient was seen and examined.  Agree with assessment and plan as noted above.  Changes made to the above note as needed.   Patient seen and independently examined with Azalee Course, PA .   We discussed all aspects of the encounter. I agree with the assessment and plan as stated above.      Chest pain :   myoview shows no ischemia .   Has a fixed scar.    No further cp   2.  Mechanical MVR:  in r is 2.7 Continue home dose of warfarin and follow up with our coumadin clinic   3.  Htn:   restarting home meds .  Follow up in the office    I have spent a total of 40 minutes with patient reviewing hospital  notes , telemetry, EKGs, labs and examining patient as well as establishing an assessment and plan that was discussed with the patient.  > 50% of time was spent in direct patient care.  Vesta Mixer, Montez Hageman., MD, [facility] 07/29/2020, 12:54 PM [address],  [address] Office [phone] Pager [phone]

## 2020-08-14 ENCOUNTER — Ambulatory Visit (INDEPENDENT_AMBULATORY_CARE_PROVIDER_SITE_OTHER): Payer: Medicare Other

## 2020-08-14 ENCOUNTER — Other Ambulatory Visit: Payer: Self-pay

## 2020-08-14 DIAGNOSIS — Z952 Presence of prosthetic heart valve: Secondary | ICD-10-CM | POA: Diagnosis not present

## 2020-08-14 DIAGNOSIS — Z5181 Encounter for therapeutic drug level monitoring: Secondary | ICD-10-CM | POA: Diagnosis not present

## 2020-08-14 DIAGNOSIS — I482 Chronic atrial fibrillation, unspecified: Secondary | ICD-10-CM | POA: Diagnosis not present

## 2020-08-14 LAB — POCT INR: INR: 2.3 (ref 2.0–3.0)

## 2020-08-14 NOTE — Patient Instructions (Signed)
Description   Take 1 tablet today, then resume same dosage of Warfarin 1/2 tablet every day except 1 tablet on Mondays, Wednesdays, and Fridays.  Recheck in 2-3 weeks, same day as visit with Dr [name]. Call if placed on any new medications or if scheduled for any procedures [phone].

## 2020-08-29 ENCOUNTER — Ambulatory Visit (INDEPENDENT_AMBULATORY_CARE_PROVIDER_SITE_OTHER): Payer: Medicare Other

## 2020-08-29 ENCOUNTER — Ambulatory Visit (INDEPENDENT_AMBULATORY_CARE_PROVIDER_SITE_OTHER): Payer: Medicare Other | Admitting: Internal Medicine

## 2020-08-29 ENCOUNTER — Other Ambulatory Visit: Payer: Self-pay

## 2020-08-29 ENCOUNTER — Encounter: Payer: Self-pay | Admitting: Internal Medicine

## 2020-08-29 VITALS — BP 198/100 | HR 94 | Ht 68.0 in | Wt 165.6 lb

## 2020-08-29 DIAGNOSIS — I482 Chronic atrial fibrillation, unspecified: Secondary | ICD-10-CM

## 2020-08-29 DIAGNOSIS — Z952 Presence of prosthetic heart valve: Secondary | ICD-10-CM

## 2020-08-29 DIAGNOSIS — I10 Essential (primary) hypertension: Secondary | ICD-10-CM

## 2020-08-29 DIAGNOSIS — Z5181 Encounter for therapeutic drug level monitoring: Secondary | ICD-10-CM | POA: Diagnosis not present

## 2020-08-29 LAB — BASIC METABOLIC PANEL
BUN/Creatinine Ratio: 12 (ref 12–28)
BUN: 18 mg/dL (ref 8–27)
CO2: 22 mmol/L (ref 20–29)
Calcium: 9.7 mg/dL (ref 8.7–10.3)
Chloride: 103 mmol/L (ref 96–106)
Creatinine, Ser: 1.48 mg/dL — ABNORMAL HIGH (ref 0.57–1.00)
Glucose: 96 mg/dL (ref 65–99)
Potassium: 4.3 mmol/L (ref 3.5–5.2)
Sodium: 142 mmol/L (ref 134–144)
eGFR: 35 mL/min/{1.73_m2} — ABNORMAL LOW (ref >59)

## 2020-08-29 LAB — POCT INR: INR: 2.6 (ref 2.0–3.0)

## 2020-08-29 MED ORDER — METOPROLOL TARTRATE 100 MG PO TABS: 100.0000 mg | ORAL_TABLET | Freq: Two times a day (BID) | ORAL | 3 refills | Status: DC

## 2020-08-29 NOTE — Patient Instructions (Signed)
Description   Continue on same dosage of Warfarin 1/2 tablet every day except 1 tablet on Mondays, Wednesdays, and Fridays.  Recheck in 4 weeks. Call if placed on any new medications or if scheduled for any procedures [phone].

## 2020-08-29 NOTE — Patient Instructions (Addendum)
Medication Instructions:  Your physician has recommended you make the following change in your medication:    Increase your metoprolol tartrate- Take 100 mg by mouth TWICE a day.   Labwork: You will get lab work today:  BMP  Testing/Procedures: None ordered.  Follow-Up: Your physician wants you to follow-up in: one year with Lewayne Bunting, MD or one of the following Advanced Practice Providers on your designated Care Team:   Francis Dowse, [place] Casimiro Needle "Mardelle Matte" Lanna Poche, [place]   Any Other Special Instructions Will Be Listed Below (If Applicable).  If you need a refill on your cardiac medications before your next appointment, please call your pharmacy.

## 2020-08-29 NOTE — Progress Notes (Signed)
HPI [name] returns today for followup of atrial fib and NSVT and HTN. She has mild AS by echo a few weeks ago. She did not take her meds this morning. She has chronic peripheral edema in her left leg after falling and breaking it 5 years ago. She denies chest pain or sob. No syncope. She admits that her bp has been elevated.  Allergies  Allergen Reactions   Esomeprazole Magnesium     REACTION: stomach upset, nausea     Current Outpatient Medications  Medication Sig Dispense Refill   Cholecalciferol (VITAMIN D) 2000 units tablet Take 1 tablet (2,000 Units total) by mouth daily. 100 tablet 3   diltiazem (CARDIZEM CD) 240 MG 24 hr capsule Take 1 capsule (240 mg total) by mouth daily. 90 capsule 3   dorzolamidel-timolol (COSOPT) 22.3-6.8 MG/ML SOLN ophthalmic solution Place 1 drop into both eyes in the morning and at bedtime.  4   isosorbide mononitrate (IMDUR) 30 MG 24 hr tablet Take 1 tablet (30 mg total) by mouth daily. 90 tablet 1   LUMIGAN 0.01 % SOLN Place 1 drop into both eyes at bedtime.  0   metoprolol tartrate (LOPRESSOR) 50 MG tablet Take 1.5 tablets (75 mg total) by mouth 2 (two) times daily. 270 tablet 3   pantoprazole (PROTONIX) 40 MG tablet Take 1 tablet (40 mg total) by mouth daily. 90 tablet 3   polyethylene glycol (MIRALAX / GLYCOLAX) 17 g packet Take 17 g by mouth daily as needed for mild constipation.     potassium chloride (KLOR-CON) 10 MEQ tablet Take 2 tablets (20 mEq total) by mouth daily. 180 tablet 3   warfarin (COUMADIN) 10 MG tablet Take 0.5-1 tablets (5-10 mg total) by mouth every evening. 10mg  Monday, Wednesday, and Friday, 5mg  on Tuesday, Thursday, Saturday and Sunday.     No current facility-administered medications for this visit.     Past Medical History:  Diagnosis Date   Asthma    years ago   Chronic atrial fibrillation (HCC)    Dilated cardiomyopathy (HCC)    history of this, now resolved   Dysphagia    Hx   Fracture of tibial plateau  05/21/2015   GERD (gastroesophageal reflux disease)    GI bleed    Hemorrhoid 04/2014   bleeding   History of blood transfusion    Hypertension    Microcytic anemia    Nonsustained ventricular tachycardia (HCC)    Osteoporosis    Protein in urine    RBBB (right bundle branch block)    Rheumatic mitral valve disease     ROS:   All systems reviewed and negative except as noted in the HPI.   Past Surgical History:  Procedure Laterality Date   CHOLECYSTECTOMY  2005   COLONOSCOPY     COLONOSCOPY W/ POLYPECTOMY     EYE SURGERY  04/2014   HEMICOLECTOMY  2008   forpolyps   MITOMYCIN C APPLICATION Right 05/17/2014   Procedure: MITOMYCIN C APPLICATION;  Surgeon: 2009, MD;  Location: [facility] OR;  Service: Ophthalmology;  Laterality: Right;   MITRAL VALVE REPLACEMENT  1987   St Jude mechanical valve   TRABECULECTOMY Right 05/17/2014   Procedure: TRABECULECTOMY WITH MITOMYCIN RIGHT EYE;  Surgeon: [facility], MD;  Location: [facility] OR;  Service: Ophthalmology;  Laterality: Right;   TUBAL LIGATION     [facility] ECHOCARDIOGRAPHY  02/2008, 03/2006, 06/2004     Family History  Problem Relation Age of Onset  Cancer Father        prostate cancer   Hypertension Mother    Stroke Mother        CVA x 2   Cancer Sister        in the Bladder   Endometriosis Daughter    Stroke Sister    Blindness Sister    Hypertension Sister    Thyroid disease Daughter    Hypertension Daughter    Hypertension Daughter    Hypertension Daughter    Liver disease Daughter    Colon cancer Neg Hx    Breast cancer Neg Hx      Social History   Socioeconomic History   Marital status: Divorced    Spouse name: Not on file   Number of children: 5   Years of education: 12   Highest education level: High school graduate  Occupational History   Occupation: Retired  Tobacco Use   Smoking status: Former    Packs/day: 1.50    Years: 15.00    Pack years: 22.50    Types: Cigarettes    Quit date: 1987    Years  since quitting: 35.6   Smokeless tobacco: Never   Tobacco comments:    quit 1987  Vaping Use   Vaping Use: Never used  Substance and Sexual Activity   Alcohol use: No   Drug use: No   Sexual activity: Not Currently  Other Topics Concern   Not on file  Social History Narrative   Patient lives alone in [place].   Patient has 3 children who live near her and offer support. 2 live up [place].   Patient is active in [name] and enjoys walking for exercise.    Social Determinants of Health   Financial Resource Strain: Not on file  Food Insecurity: Not on file  Transportation Needs: Not on file  Physical Activity: Not on file  Stress: Not on file  Social Connections: Not on file  Intimate Partner Violence: Not on file     BP (!) 198/100   Pulse 94   Ht 5\' 8"  (1.727 m)   Wt 165 lb 9.6 oz (75.1 kg)   SpO2 95%   BMI 25.18 kg/m   Physical Exam:  Well appearing NAD HEENT: Unremarkable Neck:  No JVD, no thyromegally Lymphatics:  No adenopathy Back:  No CVA tenderness Lungs:  Clear with no wheezes HEART:  Regular rate rhythm, no murmurs, no rubs, no clicks Abd:  soft, positive bowel sounds, no organomegally, no rebound, no guarding Ext:  2 plus pulses, no edema, no cyanosis, no clubbing Skin:  No rashes no nodules Neuro:  CN II through XII intact, motor grossly intact  EKG - atrial fib with RBBB  Assess/Plan:  Atrial fib - she appears to be fairly well controlled. I will uptitrate her beta blocker HTN - her bp is up. She did not take her meds this morning. She will uptitrate her beta blocker as her bp's at home have been elevated.  Coags - she will continue her warfarin.  Mitral valve replacement - her valve function by echo was thought to be normal.  AS - she has a mean gradient of 14. We will follow.  Wilder Kurowski,MD

## 2020-08-31 ENCOUNTER — Other Ambulatory Visit: Payer: Self-pay | Admitting: Gastroenterology

## 2020-08-31 DIAGNOSIS — K7469 Other cirrhosis of liver: Secondary | ICD-10-CM

## 2020-09-20 ENCOUNTER — Ambulatory Visit
Admission: RE | Admit: 2020-09-20 | Discharge: 2020-09-20 | Disposition: A | Discharge status: Home / Self Care | Payer: Medicare Other | Source: Ambulatory Visit | Attending: Gastroenterology | Admitting: Gastroenterology

## 2020-09-20 DIAGNOSIS — K7460 Unspecified cirrhosis of liver: Secondary | ICD-10-CM | POA: Diagnosis not present

## 2020-09-20 DIAGNOSIS — K7469 Other cirrhosis of liver: Secondary | ICD-10-CM

## 2020-09-25 ENCOUNTER — Telehealth: Payer: Self-pay | Admitting: Family Medicine

## 2020-09-25 ENCOUNTER — Ambulatory Visit (INDEPENDENT_AMBULATORY_CARE_PROVIDER_SITE_OTHER): Payer: Medicare Other

## 2020-09-25 ENCOUNTER — Other Ambulatory Visit: Payer: Self-pay

## 2020-09-25 DIAGNOSIS — I482 Chronic atrial fibrillation, unspecified: Secondary | ICD-10-CM

## 2020-09-25 DIAGNOSIS — Z5181 Encounter for therapeutic drug level monitoring: Secondary | ICD-10-CM

## 2020-09-25 DIAGNOSIS — Z952 Presence of prosthetic heart valve: Secondary | ICD-10-CM

## 2020-09-25 LAB — POCT INR: INR: 1.6 — AB (ref 2.0–3.0)

## 2020-09-25 NOTE — Telephone Encounter (Signed)
Pt walked in requesting Dr. Jennette Kettle to call her she needs a form filled out for social service for in home service [facility])?

## 2020-09-25 NOTE — Patient Instructions (Signed)
Description   Take 1 tablet today and 1.5 tablets tomorrow and then resume taking Warfarin 1/2 tablet every day except 1 tablet on Mondays, Wednesdays, and Fridays.  Recheck in 1 week. Call if placed on any new medications or if scheduled for any procedures [phone].

## 2020-09-25 NOTE — Telephone Encounter (Signed)
Patient returns call to nurse line. Patient states that she is needing a new FL2 form to be completed.   Will forward to PCP.   Veronda Prude, RN

## 2020-09-26 NOTE — Telephone Encounter (Signed)
Spoke to pt. Pt gave me the number to [facility] to call and get the fax number the form needs to go to once it is filled out. Please send the fax to the following:  [place] [facility] Ph: [phone] Fax [phone].   Sunday Spillers, CMA

## 2020-09-26 NOTE — Telephone Encounter (Signed)
Form given to Dr. Jennette Kettle, will call pt to ask what she would like [place] to do with the form once completed.  Sunday Spillers, CMA

## 2020-09-27 NOTE — Telephone Encounter (Signed)
Form has been faxed to [place] [facility] at number below. Sunday Spillers, CMA

## 2020-10-02 ENCOUNTER — Ambulatory Visit (INDEPENDENT_AMBULATORY_CARE_PROVIDER_SITE_OTHER): Payer: Medicare Other | Admitting: *Deleted

## 2020-10-02 ENCOUNTER — Other Ambulatory Visit: Payer: Self-pay

## 2020-10-02 DIAGNOSIS — Z952 Presence of prosthetic heart valve: Secondary | ICD-10-CM | POA: Diagnosis not present

## 2020-10-02 DIAGNOSIS — I482 Chronic atrial fibrillation, unspecified: Secondary | ICD-10-CM

## 2020-10-02 DIAGNOSIS — Z5181 Encounter for therapeutic drug level monitoring: Secondary | ICD-10-CM

## 2020-10-02 LAB — POCT INR: INR: 2.3 (ref 2.0–3.0)

## 2020-10-02 NOTE — Patient Instructions (Signed)
Description   Start taking warfarin 1 tablet daily except for 1/2 a tablet on Sunday and Thursday. Be consistent with your 3 serving of greens per week and your 4 ensures a week. Recheck INR in 1 week. [facility] [phone].

## 2020-10-08 NOTE — Telephone Encounter (Signed)
Patient calls nurse line stating she received a letter stating if FL2 is not received by 9/30 she will lose her benefits.   Form pulled and faxed to alternate fax number given by Avis.

## 2020-10-11 ENCOUNTER — Other Ambulatory Visit: Payer: Self-pay | Admitting: Family Medicine

## 2020-10-11 ENCOUNTER — Other Ambulatory Visit: Payer: Self-pay

## 2020-10-11 MED ORDER — DILTIAZEM HCL ER COATED BEADS 240 MG PO CP24: 240.0000 mg | ORAL_CAPSULE | Freq: Every day | ORAL | 3 refills | Status: DC

## 2020-10-13 ENCOUNTER — Other Ambulatory Visit: Payer: Self-pay | Admitting: Internal Medicine

## 2020-10-15 ENCOUNTER — Other Ambulatory Visit: Payer: Self-pay

## 2020-10-15 MED ORDER — POTASSIUM CHLORIDE ER 10 MEQ PO TBCR: 20.0000 meq | EXTENDED_RELEASE_TABLET | Freq: Every day | ORAL | 3 refills | Status: DC

## 2020-10-16 ENCOUNTER — Ambulatory Visit: Payer: Medicare Other | Admitting: Podiatry

## 2020-10-16 ENCOUNTER — Other Ambulatory Visit: Payer: Self-pay

## 2020-10-16 ENCOUNTER — Ambulatory Visit (INDEPENDENT_AMBULATORY_CARE_PROVIDER_SITE_OTHER): Payer: Medicare Other

## 2020-10-16 DIAGNOSIS — Z952 Presence of prosthetic heart valve: Secondary | ICD-10-CM | POA: Diagnosis not present

## 2020-10-16 DIAGNOSIS — I482 Chronic atrial fibrillation, unspecified: Secondary | ICD-10-CM

## 2020-10-16 DIAGNOSIS — Z5181 Encounter for therapeutic drug level monitoring: Secondary | ICD-10-CM | POA: Diagnosis not present

## 2020-10-16 LAB — POCT INR: INR: 4.2 — AB (ref 2.0–3.0)

## 2020-10-16 NOTE — Patient Instructions (Signed)
Description   Skip today's dosage of Warfarin, then start taking 1 tablet daily except for 1/2 a tablet on Sundays, Tuesdays and Thursdays. Be consistent with your 3 serving of greens per week and your 4 ensures a week. Recheck INR in 2 weeks. [facility] [phone].

## 2020-10-30 ENCOUNTER — Other Ambulatory Visit: Payer: Self-pay

## 2020-10-30 ENCOUNTER — Ambulatory Visit (INDEPENDENT_AMBULATORY_CARE_PROVIDER_SITE_OTHER): Payer: Medicare Other

## 2020-10-30 DIAGNOSIS — I482 Chronic atrial fibrillation, unspecified: Secondary | ICD-10-CM | POA: Diagnosis not present

## 2020-10-30 DIAGNOSIS — Z952 Presence of prosthetic heart valve: Secondary | ICD-10-CM

## 2020-10-30 DIAGNOSIS — Z5181 Encounter for therapeutic drug level monitoring: Secondary | ICD-10-CM

## 2020-10-30 LAB — POCT INR: INR: 3.4 — AB (ref 2.0–3.0)

## 2020-10-30 NOTE — Patient Instructions (Signed)
Description   Continue on same dosage 1 tablet daily except for 1/2 a tablet on Sundays, Tuesdays and Thursdays. Be consistent with your 3 serving of greens per week and your 4 ensures a week. Recheck INR in 3 weeks. [facility] [phone].

## 2020-11-15 DIAGNOSIS — H5212 Myopia, left eye: Secondary | ICD-10-CM | POA: Diagnosis not present

## 2020-11-15 DIAGNOSIS — H401133 Primary open-angle glaucoma, bilateral, severe stage: Secondary | ICD-10-CM | POA: Diagnosis not present

## 2020-11-20 ENCOUNTER — Ambulatory Visit (INDEPENDENT_AMBULATORY_CARE_PROVIDER_SITE_OTHER): Payer: Medicare Other

## 2020-11-20 ENCOUNTER — Other Ambulatory Visit: Payer: Self-pay

## 2020-11-20 DIAGNOSIS — Z5181 Encounter for therapeutic drug level monitoring: Secondary | ICD-10-CM | POA: Diagnosis not present

## 2020-11-20 DIAGNOSIS — I482 Chronic atrial fibrillation, unspecified: Secondary | ICD-10-CM | POA: Diagnosis not present

## 2020-11-20 DIAGNOSIS — Z952 Presence of prosthetic heart valve: Secondary | ICD-10-CM | POA: Diagnosis not present

## 2020-11-20 LAB — POCT INR: INR: 2.7 (ref 2.0–3.0)

## 2020-11-20 NOTE — Patient Instructions (Signed)
Description   Continue on same dosage 1 tablet daily except for 1/2 a tablet on Sundays, Tuesdays and Thursdays. Be consistent with your 3 serving of greens per week and your 4 ensures a week. Recheck INR in 4 weeks. [facility] [phone].

## 2020-11-30 ENCOUNTER — Telehealth: Payer: Self-pay | Admitting: Internal Medicine

## 2020-11-30 MED ORDER — AMOXICILLIN 500 MG PO CAPS: ORAL_CAPSULE | ORAL | 1 refills | Status: DC

## 2020-11-30 NOTE — Telephone Encounter (Signed)
Script sent as requested ./cy 

## 2020-11-30 NOTE — Telephone Encounter (Signed)
  Pt c/o medication issue:  1. Name of Medication: amoxicillin  2. How are you currently taking this medication (dosage and times per day)? 4 tablets  3. Are you having a reaction (difficulty breathing--STAT)?   4. What is your medication issue? Pt said she needs amoxicillin so she can get her dentist appt, pt said, she needs to take this per Dr. [name] before dental procedure

## 2020-12-18 ENCOUNTER — Other Ambulatory Visit: Payer: Self-pay

## 2020-12-18 ENCOUNTER — Ambulatory Visit (INDEPENDENT_AMBULATORY_CARE_PROVIDER_SITE_OTHER): Payer: Medicare Other

## 2020-12-18 DIAGNOSIS — I482 Chronic atrial fibrillation, unspecified: Secondary | ICD-10-CM

## 2020-12-18 DIAGNOSIS — Z952 Presence of prosthetic heart valve: Secondary | ICD-10-CM

## 2020-12-18 DIAGNOSIS — Z5181 Encounter for therapeutic drug level monitoring: Secondary | ICD-10-CM | POA: Diagnosis not present

## 2020-12-18 LAB — POCT INR: INR: 3.1 — AB (ref 2.0–3.0)

## 2020-12-18 NOTE — Patient Instructions (Signed)
Description   Continue on same dosage 1 tablet daily except for 1/2 a tablet on Sundays, Tuesdays and Thursdays. Be consistent with your 3 serving of greens per week and your 4 ensures a week. Recheck INR in 5 weeks. [facility] Clinic [phone].

## 2020-12-24 ENCOUNTER — Other Ambulatory Visit: Payer: Self-pay

## 2020-12-24 MED ORDER — WARFARIN SODIUM 10 MG PO TABS: ORAL_TABLET | ORAL | 1 refills | Status: DC

## 2021-01-20 ENCOUNTER — Other Ambulatory Visit: Payer: Self-pay | Admitting: Physician Assistant

## 2021-01-22 ENCOUNTER — Other Ambulatory Visit: Payer: Self-pay

## 2021-01-22 ENCOUNTER — Ambulatory Visit (INDEPENDENT_AMBULATORY_CARE_PROVIDER_SITE_OTHER): Payer: Medicare Other

## 2021-01-22 DIAGNOSIS — Z5181 Encounter for therapeutic drug level monitoring: Secondary | ICD-10-CM | POA: Diagnosis not present

## 2021-01-22 DIAGNOSIS — I482 Chronic atrial fibrillation, unspecified: Secondary | ICD-10-CM

## 2021-01-22 DIAGNOSIS — Z952 Presence of prosthetic heart valve: Secondary | ICD-10-CM | POA: Diagnosis not present

## 2021-01-22 LAB — POCT INR: INR: 2.6 (ref 2.0–3.0)

## 2021-01-22 NOTE — Patient Instructions (Signed)
Description   Continue on same dosage 1 tablet daily except for 1/2 a tablet on Sundays, Tuesdays and Thursdays. Be consistent with your 3 serving of greens per week and your 4 ensures a week. Recheck INR in 6 weeks. [facility]in Clinic [phone].

## 2021-03-04 ENCOUNTER — Other Ambulatory Visit: Payer: Self-pay | Admitting: Gastroenterology

## 2021-03-04 ENCOUNTER — Telehealth: Payer: Self-pay | Admitting: Family Medicine

## 2021-03-04 DIAGNOSIS — K7460 Unspecified cirrhosis of liver: Secondary | ICD-10-CM

## 2021-03-04 NOTE — Telephone Encounter (Signed)
Called patient to schedule AWV, LVM if patient calls back, please assist in scheduling. Any question please ask. Thanks!

## 2021-03-05 ENCOUNTER — Other Ambulatory Visit: Payer: Self-pay

## 2021-03-05 ENCOUNTER — Ambulatory Visit (INDEPENDENT_AMBULATORY_CARE_PROVIDER_SITE_OTHER): Payer: Medicare Other | Admitting: *Deleted

## 2021-03-05 DIAGNOSIS — Z5181 Encounter for therapeutic drug level monitoring: Secondary | ICD-10-CM

## 2021-03-05 DIAGNOSIS — Z952 Presence of prosthetic heart valve: Secondary | ICD-10-CM | POA: Diagnosis not present

## 2021-03-05 DIAGNOSIS — I482 Chronic atrial fibrillation, unspecified: Secondary | ICD-10-CM | POA: Diagnosis not present

## 2021-03-05 LAB — POCT INR: INR: 4.6 — AB (ref 2.0–3.0)

## 2021-03-05 NOTE — Patient Instructions (Signed)
Description   Do not take any Warfarin today and take 1/2 tablet tomorrow then continue on same dosage 1 tablet daily except for 1/2 a tablet on Sundays, Tuesdays and Thursdays. Be consistent with your 3 serving of greens per week and your 4 ensures a week. Recheck INR in 2 weeks (normally 6 weeks). [facility] [phone].

## 2021-03-11 ENCOUNTER — Other Ambulatory Visit: Payer: Medicare Other

## 2021-03-20 ENCOUNTER — Ambulatory Visit (INDEPENDENT_AMBULATORY_CARE_PROVIDER_SITE_OTHER): Payer: Medicare Other | Admitting: Family Medicine

## 2021-03-20 ENCOUNTER — Other Ambulatory Visit: Payer: Self-pay

## 2021-03-20 ENCOUNTER — Inpatient Hospital Stay (HOSPITAL_COMMUNITY)
Admission: EM | Admit: 2021-03-20 | Discharge: 2021-04-03 | DRG: 286 | Disposition: A | Discharge status: Home Health | Payer: Medicare Other | Source: Ambulatory Visit | Attending: Family Medicine | Admitting: Family Medicine

## 2021-03-20 ENCOUNTER — Encounter (HOSPITAL_COMMUNITY): Payer: Self-pay | Admitting: Emergency Medicine

## 2021-03-20 ENCOUNTER — Encounter: Payer: Self-pay | Admitting: Family Medicine

## 2021-03-20 ENCOUNTER — Emergency Department (HOSPITAL_COMMUNITY): Payer: Medicare Other

## 2021-03-20 ENCOUNTER — Inpatient Hospital Stay (HOSPITAL_COMMUNITY): Payer: Medicare Other

## 2021-03-20 DIAGNOSIS — N390 Urinary tract infection, site not specified: Secondary | ICD-10-CM | POA: Diagnosis present

## 2021-03-20 DIAGNOSIS — I5043 Acute on chronic combined systolic (congestive) and diastolic (congestive) heart failure: Secondary | ICD-10-CM | POA: Diagnosis not present

## 2021-03-20 DIAGNOSIS — Z952 Presence of prosthetic heart valve: Secondary | ICD-10-CM

## 2021-03-20 DIAGNOSIS — I517 Cardiomegaly: Secondary | ICD-10-CM | POA: Diagnosis not present

## 2021-03-20 DIAGNOSIS — I10 Essential (primary) hypertension: Secondary | ICD-10-CM | POA: Diagnosis not present

## 2021-03-20 DIAGNOSIS — I130 Hypertensive heart and chronic kidney disease with heart failure and stage 1 through stage 4 chronic kidney disease, or unspecified chronic kidney disease: Principal | ICD-10-CM | POA: Diagnosis present

## 2021-03-20 DIAGNOSIS — D649 Anemia, unspecified: Secondary | ICD-10-CM | POA: Diagnosis present

## 2021-03-20 DIAGNOSIS — N179 Acute kidney failure, unspecified: Secondary | ICD-10-CM

## 2021-03-20 DIAGNOSIS — I482 Chronic atrial fibrillation, unspecified: Secondary | ICD-10-CM

## 2021-03-20 DIAGNOSIS — I509 Heart failure, unspecified: Secondary | ICD-10-CM

## 2021-03-20 DIAGNOSIS — N183 Chronic kidney disease, stage 3 unspecified: Secondary | ICD-10-CM | POA: Diagnosis not present

## 2021-03-20 DIAGNOSIS — Z9049 Acquired absence of other specified parts of digestive tract: Secondary | ICD-10-CM

## 2021-03-20 DIAGNOSIS — K5900 Constipation, unspecified: Secondary | ICD-10-CM | POA: Diagnosis present

## 2021-03-20 DIAGNOSIS — F05 Delirium due to known physiological condition: Secondary | ICD-10-CM | POA: Diagnosis not present

## 2021-03-20 DIAGNOSIS — Z7189 Other specified counseling: Secondary | ICD-10-CM | POA: Diagnosis not present

## 2021-03-20 DIAGNOSIS — Z1629 Resistance to other single specified antibiotic: Secondary | ICD-10-CM | POA: Diagnosis not present

## 2021-03-20 DIAGNOSIS — N320 Bladder-neck obstruction: Secondary | ICD-10-CM | POA: Diagnosis not present

## 2021-03-20 DIAGNOSIS — Y846 Urinary catheterization as the cause of abnormal reaction of the patient, or of later complication, without mention of misadventure at the time of the procedure: Secondary | ICD-10-CM | POA: Diagnosis not present

## 2021-03-20 DIAGNOSIS — I4510 Unspecified right bundle-branch block: Secondary | ICD-10-CM | POA: Diagnosis present

## 2021-03-20 DIAGNOSIS — J9601 Acute respiratory failure with hypoxia: Secondary | ICD-10-CM

## 2021-03-20 DIAGNOSIS — Z87891 Personal history of nicotine dependence: Secondary | ICD-10-CM

## 2021-03-20 DIAGNOSIS — B964 Proteus (mirabilis) (morganii) as the cause of diseases classified elsewhere: Secondary | ICD-10-CM | POA: Diagnosis present

## 2021-03-20 DIAGNOSIS — I350 Nonrheumatic aortic (valve) stenosis: Secondary | ICD-10-CM | POA: Diagnosis present

## 2021-03-20 DIAGNOSIS — I5023 Acute on chronic systolic (congestive) heart failure: Secondary | ICD-10-CM

## 2021-03-20 DIAGNOSIS — R339 Retention of urine, unspecified: Secondary | ICD-10-CM | POA: Diagnosis not present

## 2021-03-20 DIAGNOSIS — B9620 Unspecified Escherichia coli [E. coli] as the cause of diseases classified elsewhere: Secondary | ICD-10-CM | POA: Diagnosis present

## 2021-03-20 DIAGNOSIS — N3281 Overactive bladder: Secondary | ICD-10-CM | POA: Diagnosis present

## 2021-03-20 DIAGNOSIS — Z743 Need for continuous supervision: Secondary | ICD-10-CM | POA: Diagnosis not present

## 2021-03-20 DIAGNOSIS — M810 Age-related osteoporosis without current pathological fracture: Secondary | ICD-10-CM | POA: Diagnosis present

## 2021-03-20 DIAGNOSIS — Z79899 Other long term (current) drug therapy: Secondary | ICD-10-CM

## 2021-03-20 DIAGNOSIS — Z7901 Long term (current) use of anticoagulants: Secondary | ICD-10-CM

## 2021-03-20 DIAGNOSIS — I110 Hypertensive heart disease with heart failure: Secondary | ICD-10-CM | POA: Diagnosis not present

## 2021-03-20 DIAGNOSIS — I472 Ventricular tachycardia, unspecified: Secondary | ICD-10-CM | POA: Diagnosis not present

## 2021-03-20 DIAGNOSIS — N1832 Chronic kidney disease, stage 3b: Secondary | ICD-10-CM | POA: Diagnosis not present

## 2021-03-20 DIAGNOSIS — Z20822 Contact with and (suspected) exposure to covid-19: Secondary | ICD-10-CM | POA: Diagnosis present

## 2021-03-20 DIAGNOSIS — R3129 Other microscopic hematuria: Secondary | ICD-10-CM | POA: Diagnosis not present

## 2021-03-20 DIAGNOSIS — R410 Disorientation, unspecified: Secondary | ICD-10-CM | POA: Diagnosis not present

## 2021-03-20 DIAGNOSIS — Z888 Allergy status to other drugs, medicaments and biological substances status: Secondary | ICD-10-CM

## 2021-03-20 DIAGNOSIS — R6889 Other general symptoms and signs: Secondary | ICD-10-CM | POA: Diagnosis not present

## 2021-03-20 DIAGNOSIS — I4821 Permanent atrial fibrillation: Secondary | ICD-10-CM | POA: Diagnosis present

## 2021-03-20 DIAGNOSIS — Z8249 Family history of ischemic heart disease and other diseases of the circulatory system: Secondary | ICD-10-CM

## 2021-03-20 DIAGNOSIS — R079 Chest pain, unspecified: Secondary | ICD-10-CM | POA: Diagnosis not present

## 2021-03-20 DIAGNOSIS — K219 Gastro-esophageal reflux disease without esophagitis: Secondary | ICD-10-CM | POA: Diagnosis present

## 2021-03-20 DIAGNOSIS — R109 Unspecified abdominal pain: Secondary | ICD-10-CM | POA: Diagnosis not present

## 2021-03-20 DIAGNOSIS — I272 Pulmonary hypertension, unspecified: Secondary | ICD-10-CM | POA: Diagnosis not present

## 2021-03-20 DIAGNOSIS — R809 Proteinuria, unspecified: Secondary | ICD-10-CM | POA: Diagnosis present

## 2021-03-20 DIAGNOSIS — I5021 Acute systolic (congestive) heart failure: Secondary | ICD-10-CM | POA: Diagnosis not present

## 2021-03-20 DIAGNOSIS — K7460 Unspecified cirrhosis of liver: Secondary | ICD-10-CM | POA: Diagnosis present

## 2021-03-20 DIAGNOSIS — J90 Pleural effusion, not elsewhere classified: Secondary | ICD-10-CM | POA: Diagnosis not present

## 2021-03-20 DIAGNOSIS — N189 Chronic kidney disease, unspecified: Secondary | ICD-10-CM | POA: Diagnosis present

## 2021-03-20 DIAGNOSIS — R609 Edema, unspecified: Secondary | ICD-10-CM | POA: Diagnosis not present

## 2021-03-20 DIAGNOSIS — J9691 Respiratory failure, unspecified with hypoxia: Secondary | ICD-10-CM | POA: Diagnosis not present

## 2021-03-20 DIAGNOSIS — Z91041 Radiographic dye allergy status: Secondary | ICD-10-CM

## 2021-03-20 DIAGNOSIS — R0602 Shortness of breath: Secondary | ICD-10-CM | POA: Diagnosis not present

## 2021-03-20 DIAGNOSIS — R791 Abnormal coagulation profile: Secondary | ICD-10-CM | POA: Diagnosis present

## 2021-03-20 DIAGNOSIS — F0390 Unspecified dementia without behavioral disturbance: Secondary | ICD-10-CM | POA: Diagnosis present

## 2021-03-20 DIAGNOSIS — R0789 Other chest pain: Secondary | ICD-10-CM | POA: Diagnosis not present

## 2021-03-20 DIAGNOSIS — J9811 Atelectasis: Secondary | ICD-10-CM | POA: Diagnosis not present

## 2021-03-20 DIAGNOSIS — I4891 Unspecified atrial fibrillation: Secondary | ICD-10-CM | POA: Diagnosis not present

## 2021-03-20 DIAGNOSIS — I5033 Acute on chronic diastolic (congestive) heart failure: Secondary | ICD-10-CM | POA: Diagnosis not present

## 2021-03-20 DIAGNOSIS — Z86010 Personal history of colonic polyps: Secondary | ICD-10-CM

## 2021-03-20 DIAGNOSIS — R0600 Dyspnea, unspecified: Secondary | ICD-10-CM | POA: Diagnosis not present

## 2021-03-20 LAB — BASIC METABOLIC PANEL
Anion gap: 8 (ref 5–15)
BUN: 19 mg/dL (ref 8–23)
CO2: 22 mmol/L (ref 22–32)
Calcium: 9.6 mg/dL (ref 8.9–10.3)
Chloride: 111 mmol/L (ref 98–111)
Creatinine, Ser: 1.61 mg/dL — ABNORMAL HIGH (ref 0.44–1.00)
GFR, Estimated: 32 mL/min — ABNORMAL LOW (ref >60)
Glucose, Bld: 105 mg/dL — ABNORMAL HIGH (ref 70–99)
Potassium: 4.2 mmol/L (ref 3.5–5.1)
Sodium: 141 mmol/L (ref 135–145)

## 2021-03-20 LAB — CBC WITH DIFFERENTIAL/PLATELET
Abs Immature Granulocytes: 0.03 10*3/uL (ref 0.00–0.07)
Basophils Absolute: 0 10*3/uL (ref 0.0–0.1)
Basophils Relative: 1 %
Eosinophils Absolute: 0.1 10*3/uL (ref 0.0–0.5)
Eosinophils Relative: 1 %
HCT: 39.5 % (ref 36.0–46.0)
Hemoglobin: 11.4 g/dL — ABNORMAL LOW (ref 12.0–15.0)
Immature Granulocytes: 0 %
Lymphocytes Relative: 23 %
Lymphs Abs: 1.7 10*3/uL (ref 0.7–4.0)
MCH: 26.8 pg (ref 26.0–34.0)
MCHC: 28.9 g/dL — ABNORMAL LOW (ref 30.0–36.0)
MCV: 92.7 fL (ref 80.0–100.0)
Monocytes Absolute: 0.5 10*3/uL (ref 0.1–1.0)
Monocytes Relative: 7 %
Neutro Abs: 5 10*3/uL (ref 1.7–7.7)
Neutrophils Relative %: 68 %
Platelets: 218 10*3/uL (ref 150–400)
RBC: 4.26 MIL/uL (ref 3.87–5.11)
RDW: 16 % — ABNORMAL HIGH (ref 11.5–15.5)
WBC: 7.3 10*3/uL (ref 4.0–10.5)
nRBC: 0 % (ref 0.0–0.2)

## 2021-03-20 LAB — RESP PANEL BY RT-PCR (FLU A&B, COVID) ARPGX2
Influenza A by PCR: NEGATIVE
Influenza B by PCR: NEGATIVE
SARS Coronavirus 2 by RT PCR: NEGATIVE

## 2021-03-20 LAB — URINALYSIS, ROUTINE W REFLEX MICROSCOPIC
Bacteria, UA: NONE SEEN
Bilirubin Urine: NEGATIVE
Glucose, UA: NEGATIVE mg/dL
Ketones, ur: NEGATIVE mg/dL
Leukocytes,Ua: NEGATIVE
Nitrite: NEGATIVE
Protein, ur: 30 mg/dL — AB
Specific Gravity, Urine: 1.008 (ref 1.005–1.030)
pH: 5 (ref 5.0–8.0)

## 2021-03-20 LAB — TROPONIN I (HIGH SENSITIVITY)
Troponin I (High Sensitivity): 14 ng/L (ref <18)
Troponin I (High Sensitivity): 14 ng/L (ref <18)

## 2021-03-20 LAB — PROTIME-INR
INR: 3 — ABNORMAL HIGH (ref 0.8–1.2)
Prothrombin Time: 30.8 seconds — ABNORMAL HIGH (ref 11.4–15.2)

## 2021-03-20 LAB — BRAIN NATRIURETIC PEPTIDE: B Natriuretic Peptide: 1633.6 pg/mL — ABNORMAL HIGH (ref 0.0–100.0)

## 2021-03-20 MED ORDER — IPRATROPIUM-ALBUTEROL 0.5-2.5 (3) MG/3ML IN SOLN
3.0000 mL | RESPIRATORY_TRACT | Status: DC
  Administered 2021-03-21 (×2): 3 mL via RESPIRATORY_TRACT
  Filled 2021-03-20 (×2): qty 3

## 2021-03-20 MED ORDER — DORZOLAMIDE HCL-TIMOLOL MAL 2-0.5 % OP SOLN
1.0000 [drp] | Freq: Two times a day (BID) | OPHTHALMIC | Status: DC
  Administered 2021-03-20 – 2021-04-03 (×27): 1 [drp] via OPHTHALMIC
  Filled 2021-03-20: qty 10

## 2021-03-20 MED ORDER — FUROSEMIDE 10 MG/ML IJ SOLN
40.0000 mg | Freq: Once | INTRAMUSCULAR | Status: AC
  Administered 2021-03-20: 40 mg via INTRAVENOUS
  Filled 2021-03-20: qty 4

## 2021-03-20 MED ORDER — IPRATROPIUM-ALBUTEROL 0.5-2.5 (3) MG/3ML IN SOLN
RESPIRATORY_TRACT | Status: AC
  Administered 2021-03-20: 3 mL via RESPIRATORY_TRACT
  Filled 2021-03-20: qty 3

## 2021-03-20 MED ORDER — POLYETHYLENE GLYCOL 3350 17 G PO PACK
17.0000 g | PACK | Freq: Every day | ORAL | Status: DC
  Administered 2021-03-21 – 2021-03-22 (×2): 17 g via ORAL
  Filled 2021-03-20 (×4): qty 1

## 2021-03-20 MED ORDER — ISOSORBIDE MONONITRATE ER 30 MG PO TB24
30.0000 mg | ORAL_TABLET | Freq: Every day | ORAL | Status: DC
  Administered 2021-03-21 – 2021-03-28 (×8): 30 mg via ORAL
  Filled 2021-03-20 (×8): qty 1

## 2021-03-20 MED ORDER — ACETAMINOPHEN 325 MG PO TABS
ORAL_TABLET | ORAL | Status: AC
  Filled 2021-03-20: qty 2

## 2021-03-20 MED ORDER — METOPROLOL TARTRATE 100 MG PO TABS
100.0000 mg | ORAL_TABLET | Freq: Two times a day (BID) | ORAL | Status: DC
  Administered 2021-03-20 – 2021-03-24 (×9): 100 mg via ORAL
  Filled 2021-03-20 (×9): qty 1

## 2021-03-20 MED ORDER — LATANOPROST 0.005 % OP SOLN
1.0000 [drp] | Freq: Every day | OPHTHALMIC | Status: DC
Start: 2021-03-20 — End: 2021-04-04
  Administered 2021-03-20 – 2021-04-02 (×13): 1 [drp] via OPHTHALMIC
  Filled 2021-03-20 (×2): qty 2.5

## 2021-03-20 MED ORDER — WARFARIN - PHYSICIAN DOSING INPATIENT: Freq: Every day | Status: DC

## 2021-03-20 MED ORDER — DILTIAZEM HCL ER COATED BEADS 240 MG PO CP24
240.0000 mg | ORAL_CAPSULE | Freq: Every day | ORAL | Status: DC
  Administered 2021-03-21 – 2021-03-22 (×2): 240 mg via ORAL
  Filled 2021-03-20 (×2): qty 1

## 2021-03-20 MED ORDER — IPRATROPIUM-ALBUTEROL 0.5-2.5 (3) MG/3ML IN SOLN: 3.0000 mL | RESPIRATORY_TRACT | Status: DC | PRN

## 2021-03-20 MED ORDER — PANTOPRAZOLE SODIUM 40 MG PO TBEC
40.0000 mg | DELAYED_RELEASE_TABLET | Freq: Every day | ORAL | Status: DC
  Administered 2021-03-21 – 2021-04-03 (×14): 40 mg via ORAL
  Filled 2021-03-20 (×14): qty 1

## 2021-03-20 MED ORDER — ACETAMINOPHEN 325 MG PO TABS
650.0000 mg | ORAL_TABLET | Freq: Once | ORAL | Status: AC
  Administered 2021-03-20: 650 mg via ORAL

## 2021-03-20 MED ORDER — WARFARIN SODIUM 10 MG PO TABS
10.0000 mg | ORAL_TABLET | Freq: Once | ORAL | Status: AC
  Administered 2021-03-20: 10 mg via ORAL
  Filled 2021-03-20: qty 1

## 2021-03-20 NOTE — Progress Notes (Cosign Needed Addendum)
FPTS Interim Progress Note ? ?Paged by RN at 2000 for patient complaining of abdominal pain and increased work of breathing. Went to see patient at bedside with Dr. Robyne Peers. Vital signs stable on entrance to room, although hypertensive. ? ?Patient awake and alert, notably uncomfortable and says she is in "pain all over" and asked for [place] to "please give me a pain shot." With further prompting, she says she had burning with urination and "feels hot." She says last BM was "days ago" and denied Miralax from day team. She falls asleep intermittently when we speak to her daughter. Patient's daughter at bedside and says her mother looks worse than she did upon arrival to the ED earlier today in terms of discomfort and general appearance. ? ?O: ?BP (!) 199/99   Pulse 84   Temp 97.8 ?F (36.6 ?C) (Oral)   Resp (!) 21   SpO2 96%   ?General: Uncomfortable appearing, mild distress, dozes off intermittently. ?CV: In atrial fibrillation, rate-controlled in 90s bpm.  ?Resp: Wheezing in right upper lung field. Exam limited secondary to discomfort. On 1L [place] with SpO2 97%. Labored breathing noted. Able to speak in short sentences. ?Abdomen: Soft, Exquisitely tender to palpation out of proportion to exam. Most discomfort in left lower abdomen. Diminished bowel sounds ?Ext: No calf tenderness to palpation.  ? ?A/P: ?Acute on chronic CHF in setting of HFpEF ?Dyspneic with increased work of breathing on 0.5L [place], increased to 1.5L [place] with mild improvement. Respiratory status does seem to remain stable, although labored, and does not require increased level of care or BiPAP at this time. Will increase O2 as necessary for patient comfort as she is not hypoxic on minimal [place]. Explained to patient and daughter that Lasix administered earlier today will likely continue to improve respiratory status. Discomfort is likely continued HF exacerbation. ?Unlikely PE, as she has not had any tachycardia and INR is supra-therapeutic.  ?- DuoNeb ordered  q4h PRN for wheezing ?- If severe decline in respiratory status, will consult CCM ?- SpO2 goal >92% ?- If any change in status or persistent tachycardia, will get further imaging ? ?Abdominal pain ?Abdomen does not appear acute. Able to tolerate abdominal exam with deep palpation although guarded with light palpation and bowel sounds diminished but present. Given last BM days ago, she likely could have pain 2/2 to constipation. Could also be a UTI given patient complaint of pain with urination, will order UA although urine in canister is pale yellow without evidence of gross infection. Will obtain KUB imaging given severity of pain and change in status from earlier in the day. ?- KUB ordered  ?- UA ordered ?- Tylenol 650mg  ordered ?- If status changes, will order CTA abdomen ? ? , DO ?03/20/2021, 8:18 PM ?PGY-1, [facility] ?Service pager [phone]  ?

## 2021-03-20 NOTE — ED Notes (Signed)
Pt SpO2 90-92% on RA, RN placed pt on 2L O2 via nasal cannula ?

## 2021-03-20 NOTE — ED Provider Notes (Signed)
?[facility] EMERGENCY DEPARTMENT ?Provider Note ? ? ?CSN: [id number] ?Arrival date & time: 03/20/21  1249 ? ?  ? ?History ? ?Chief Complaint  ?Patient presents with  ? Shortness of Breath  ? ? ?[name] is a 86 y.o. female. ? ?HPI ?Patient presents for evaluation of worsening shortness of breath, for several months, and today went to see her PCP, was concerned about uncontrolled atrial fibrillation causing heart failure.  She has a history of mitral valve disease, paroxysmal atrial fibrillation, is on anticoagulation and has been told by her cardiologist that she may have progressive valvular disease requiring further interventions.  Today at the PCP office she was hypoxic at 89%.  EMS was called to transfer her here, she arrived with normal oxygen saturation, well on nasal cannula oxygen at 2 L.  She is having periods of shortness of breath that are worse with exertion and sometimes has a tight feeling in her upper chest and lower neck when this happens. ?  ? ?Home Medications ?Prior to Admission medications   ?Medication Sig Start Date End Date Taking? Authorizing Provider  ?amoxicillin (AMOXIL) 500 MG capsule Take 2000 mg  1 hour prior to dental procedure 11/30/20   Marinus Maw, MD  ?Cholecalciferol (VITAMIN D) 2000 units tablet Take 1 tablet (2,000 Units total) by mouth daily. 06/10/17   Nestor Ramp, MD  ?diltiazem (CARDIZEM CD) 240 MG 24 hr capsule Take 1 capsule (240 mg total) by mouth daily. 10/11/20   Marinus Maw, MD  ?dorzolamidel-timolol (COSOPT) 22.3-6.8 MG/ML SOLN ophthalmic solution Place 1 drop into both eyes in the morning and at bedtime. 03/02/17   [provider]  ?isosorbide mononitrate (IMDUR) 30 MG 24 hr tablet TAKE 1 TABLET(30 MG) BY MOUTH DAILY 01/22/21   Marinus Maw, MD  ?LUMIGAN 0.01 % SOLN Place 1 drop into both eyes at bedtime. 01/26/17   [provider]  ?metoprolol tartrate (LOPRESSOR) 100 MG tablet Take 1 tablet (100 mg total) by mouth 2  (two) times daily. 08/29/20 11/27/20  Marinus Maw, MD  ?pantoprazole (PROTONIX) 40 MG tablet TAKE 1 TABLET(40 MG) BY MOUTH DAILY 10/12/20   Nestor Ramp, MD  ?polyethylene glycol (MIRALAX / GLYCOLAX) 17 g packet Take 17 g by mouth daily as needed for mild constipation. 07/27/20   Azalee Course, PA  ?potassium chloride (KLOR-CON) 10 MEQ tablet Take 2 tablets (20 mEq total) by mouth daily. 10/15/20 01/13/21  Marinus Maw, MD  ?warfarin (COUMADIN) 10 MG tablet Take 1/2 to 1 tablet by mouth once daily as directed by anticoagulation clinic 12/24/20   Marinus Maw, MD  ?   ? ?Allergies    ?Esomeprazole magnesium   ? ?Review of Systems   ?Review of Systems ? ?Physical Exam ?Updated Vital Signs ?BP (!) 157/84   Pulse 67   Temp 97.8 ?F (36.6 ?C) (Oral)   Resp 20   SpO2 100%  ?Physical Exam ?Vitals and nursing note reviewed.  ?Constitutional:   ?   General: She is not in acute distress. ?   Appearance: She is well-developed. She is ill-appearing. She is not toxic-appearing or diaphoretic.  ?HENT:  ?   Head: Normocephalic and atraumatic.  ?   Right Ear: External ear normal.  ?   Left Ear: External ear normal.  ?Eyes:  ?   Conjunctiva/sclera: Conjunctivae normal.  ?   Pupils: Pupils are equal, round, and reactive to light.  ?Neck:  ?   Trachea:  Phonation normal.  ?Cardiovascular:  ?   Rate and Rhythm: Normal rate and regular rhythm.  ?   Heart sounds: Normal heart sounds.  ?   Comments: JVD present, while semirecumbent ?Pulmonary:  ?   Effort: Pulmonary effort is normal. No respiratory distress.  ?   Breath sounds: No stridor. Rhonchi present.  ?   Comments: Decreased air movement bilaterally.  No wheezes ?Abdominal:  ?   General: There is no distension.  ?   Palpations: Abdomen is soft.  ?   Tenderness: There is no abdominal tenderness.  ?Musculoskeletal:     ?   General: Normal range of motion.  ?   Cervical back: Normal range of motion and neck supple.  ?   Right lower leg: Edema present.  ?   Left lower leg: Edema  present.  ?Skin: ?   General: Skin is warm and dry.  ?Neurological:  ?   Mental Status: She is alert and oriented to person, place, and time.  ?   Cranial Nerves: No cranial nerve deficit.  ?   Sensory: No sensory deficit.  ?   Motor: No abnormal muscle tone.  ?   Coordination: Coordination normal.  ?Psychiatric:     ?   Mood and Affect: Mood normal.     ?   Behavior: Behavior normal.     ?   Thought Content: Thought content normal.     ?   Judgment: Judgment normal.  ? ? ?ED Results / Procedures / Treatments   ?Labs ?(all labs ordered are listed, but only abnormal results are displayed) ?Labs Reviewed  ?BASIC METABOLIC PANEL - Abnormal; Notable for the following components:  ?    Result Value  ? Glucose, Bld 105 (*)   ? Creatinine, Ser 1.61 (*)   ? GFR, Estimated 32 (*)   ? All other components within normal limits  ?CBC WITH DIFFERENTIAL/PLATELET - Abnormal; Notable for the following components:  ? Hemoglobin 11.4 (*)   ? MCHC 28.9 (*)   ? RDW 16.0 (*)   ? All other components within normal limits  ?BRAIN NATRIURETIC PEPTIDE - Abnormal; Notable for the following components:  ? B Natriuretic Peptide 1,633.6 (*)   ? All other components within normal limits  ?RESP PANEL BY RT-PCR (FLU A&B, COVID) ARPGX2  ?PROTIME-INR  ?TROPONIN I (HIGH SENSITIVITY)  ?TROPONIN I (HIGH SENSITIVITY)  ? ? ?EKG ?None ? ?Radiology ?DG Chest 2 View ? ?Result Date: 03/20/2021 ?CLINICAL DATA:  Shortness of breath EXAM: CHEST - 2 VIEW COMPARISON:  Previous studies including the examination of 07/25/2020 FINDINGS: Transverse diameter of heart is increased. Central pulmonary vessels are more prominent. There is subtle increase in interstitial markings in the mid and lower lung fields, more so on the right side. There is blunting of both lateral CP angles. There is no pneumothorax. Metallic sutures seen in the sternum. IMPRESSION: Cardiomegaly. Central pulmonary vessels are more prominent suggesting mild CHF. Subtle increase in interstitial  markings in the mid and lower lung fields may suggest mild interstitial pulmonary edema. Small bilateral pleural effusions. Electronically Signed   By: Ernie Avena M.D.   On: 03/20/2021 13:52   ? ?Procedures ?Procedures  ? ? ?Medications Ordered in ED ?Medications  ?furosemide (LASIX) injection 40 mg (has no administration in time range)  ? ? ?ED Course/ Medical Decision Making/ A&P ?  ?                        ?  Medical Decision Making ?Patient presenting with shortness of breath, atrial fibrillation rate and concern for heart failure.  Hypoxic while at her PCP office.  She has been troubled mostly by dyspnea on exertion recently. ? ?Amount and/or Complexity of Data Reviewed ?Independent Historian:  ?   Details: She is a cogent historian ?Labs: ordered. ?   Details: CBC, metabolic panel, BMP, troponin-normal except creatinine high, GFR low, hemoglobin low, BNP high ?Radiology: ordered and independent interpretation performed. ?   Details: Chest x-ray-mild CHF with bilateral small pleural effusions. ?ECG/medicine tests: ordered. ? ?Risk ?Prescription drug management. ?Decision regarding hospitalization. ?Risk Details: Patient presenting with dyspnea, ongoing, with history of mild heart failure, atrial fibrillation and status post mitral valve repair.  She is anticoagulated, on warfarin.  Patient treated with Lasix in the ED. ? ?Critical Care ?Total time providing critical care: 30-74 minutes ? ?Admission arranged for ongoing management. Dr. Karene Fry talked to the consultant. ? ? ? ? ? ? ? ?Final Clinical Impression(s) / ED Diagnoses ?Final diagnoses:  ?Acute congestive heart failure, unspecified heart failure type (HCC)  ?Abdominal pain  ?Acute respiratory failure with hypoxia (HCC)  ? ? ?Rx / DC Orders ?ED Discharge Orders   ? ? None  ? ?  ? ? ?  ?Mancel Bale, MD ?03/21/21 [phone] ? ?

## 2021-03-20 NOTE — Progress Notes (Signed)
MD at bedside to follow up on pt's status. New orders in place on pt. Breathing treatments placed at scheduled times to treat sob and wheezing.  ? ?Pt is resting at this time when evaluated by MD. BP's are being closely monitored. Pt received scheduled Metoprolol 100 mg PO. Pt also has Miralax in place if needed due to complaints of abd discomfort. Will continue to monitor pt closely and notify MD if needed.  ?

## 2021-03-20 NOTE — ED Triage Notes (Signed)
Pt here from PCP for shortness of breath and potential need for stents. Pt's family took pt to PCP for exertional shob x2-3 months w/ intermittent cp. 98% RA, CBG 111, 160/96, 82HR ?

## 2021-03-20 NOTE — Progress Notes (Signed)
? ?  S: Went to bedside to check on patient with Dr. Owens Shark. Patient resting comfortably, awakens to my voice. She reports that she is doing much better compared to earlier. Not having any difficulty breathing. She endorses that her abdominal pain has also improved.  ? ?O: ?BP (!) 165/95   Pulse 92   Temp (!) 97.5 ?F (36.4 ?C) (Oral)   Resp (!) 22   SpO2 97%   ?General: Patient resting comfortably, in no acute distress. ?CV: irregularly irregular ?Resp: mildly diffuse crackles, no wheezing noted, breathing comfortably on 2L O2 ?Abdomen: soft, minimal generalized tenderness upon deep palpation, presence of slightly diminished bowel sounds diffusely  ?Psych: mood appropriate  ? ?A/P: ?-plan to redose lasix am per day team ?-scheduled duonebs q4h ?-metoprolol given with improved BP ?-continue to monitor closely ?-remainder of plan per day team  ? ?Donney Dice, DO ?03/20/2021, 11:40 PM ?PGY-2, [place] ?Service pager [phone]  ?

## 2021-03-20 NOTE — Progress Notes (Signed)
? ? ?  CHIEF COMPLAINT / HPI: ?Patient presented to clinic for scheduled appointment.  Found to be extremely short of breath.  Pulse variable, up to 125 with minimal exertion.  Pulse ox 89% room air.  Agree she has been extremely short of breath with minimal exertion over the last few weeks.  Her cardiologist had indicated potential interventional procedure if she had recurrence of symptoms. ?Admits to some chest pains this morning now resolved. ? ? ?PERTINENT  PMH / PSH: I have reviewed the patient?s medications, allergies, past medical and surgical history, smoking status and updated in the EMR as appropriate. ?History of mitral valve replacement, history of ventricular tachycardia, chronic atrial fibrillation the nursing home on chronic anticoagulation appointment.   ? ?OBJECTIVE: ? BP (!) 164/117   Pulse (!) 127   Wt 175 lb 6.4 oz (79.6 kg)   SpO2 95%   BMI 26.67 kg/m?  ?CV: Irregularly irregular. ?LUNGS: Slight crackles at the bases otherwise lung sounds heard in all lung fields ?EXTREMITY: Trace edema bilateral ankles ?general: Breathless with least amount of movement or exertion including talking for long period of time. ?PSYCH: AxOx4. Good eye contact.. No psychomotor retardation or agitation. Appropriate speech fluency and content. Asks and answers questions appropriately. Mood is congruent. ? ? ? ?ASSESSMENT / PLAN: ?#1.  Acutely ill patient ?She is at risk for acute congestive heart failure with history of valvular disease.  Intermittently in rapid ventricular rate today with chronic atrial fibrillation.  Discussion with her and her daughter.  Feel strongly she should be admitted to the hospital and she finally agreed.  We will transfer port via EMS. ? ?Chronic atrial fibrillation (HCC) ?Chronic A-fib, potentially in atrial fibrillation with RVR.  History of multiple valvular issues.  Concern for acute onset congestive heart failure.  Transported to ED via EMS. ? ?PLEASE feel free to contact family  medicine inpatient service for admission [phone] ? ?History of mitral valve replacement - St Jude mechanical valve ?Concern at this history of valve replacement and her report today that her cardiologist mention she might need cardiac catheterization and stent placement if she got more short of breath, is potentially placing her in risk for myocardial infarction/heart failure.  She is already showing some signs of heart failure with edema, dyspnea on exertion.  Surprisingly after discussion, she agreed to admission. ? ?Last 1. Left ventricular ejection fraction, by estimation, is 50 to 55%. The  ?left ventricle has low normal function. The left ventricle demonstrates  ?regional wall motion abnormalities (see scoring diagram/findings for  ?description). There is moderate  ?concentric left ventricular hypertrophy. Left ventricular diastolic  ?function could not be evaluated. There is moderate hypokinesis of the left  ?ventricular, mid-apical inferolateral wall and inferior wall. There is  ?mild hypokinesis of the left  ?ventricular, entire anterolateral wall.  ? 2. Right ventricular systolic function is mildly reduced. The right  ?ventricular size is normal. There is normal pulmonary artery systolic  ?pressure.  ? 3. Left atrial size was severely dilated.  ? 4. Right atrial size was severely dilated.  ? 5. The mitral valve has been repaired/replaced. not evaluated mitral  ?valve regurgitation. There is a mechanical valve present in the mitral  ?position. Procedure Date: 77. Echo findings are consistent with normal  ?structure and function of the mitral  ?valve prosthesis.  ? 6. Tricuspid valve regurgitation is mild to moderate.  echocardiogram: ?  ?Denny Levy MD ?

## 2021-03-20 NOTE — Assessment & Plan Note (Signed)
Chronic A-fib, potentially in atrial fibrillation with RVR.  History of multiple valvular issues.  Concern for acute onset congestive heart failure.  Transported to ED via EMS. ? ?PLEASE feel free to contact family medicine inpatient service for admission [phone] ?

## 2021-03-20 NOTE — ED Notes (Signed)
FM resident paged r/t increased O2 need, c/o abd pain and burning at urethra since lasix administration. ?

## 2021-03-20 NOTE — Progress Notes (Addendum)
Pt arrived from ED with labored breathing and expiratory wheezes unable to complete admission questions at this time. Explained to pt to take slow deep breaths. O2 sat WNL in upper 90's. Pt is being closely monitored.  ?

## 2021-03-20 NOTE — H&P (Addendum)
[facility] Admission History and Physical Service Pager: [phone]  [name] Medical record number: [id number] Date of birth: 1935-11-08 Age: 86 y.o. Gender: female  Primary Care Provider: Dickie La, MD Consultants: Cardiology Code Status: Full Preferred Emergency Contact: daughter, Christoper Allegra  Chief Complaint: Dyspnea on exertion, chest pain  Assessment and Plan: [name] is a 86 y.o. female presenting with dyspnea on exertion and chest pain. PMH is significant for HFpEF, s/p mechanical mitral valve on coumadin, permanent a fib, HTN, CKD stage III, osteoporosis, history of right hemicolectomy, cirrhosis, GERD  DOE and CP   acute on chronic CHF in setting of HFpEF   Acute hypoxic respiratory failure 86 yo woman presents with DOE and desaturated to 88% on room air, requiring 0.5L [place] to maintain SpO2 >92%. She does not use O2 at baseline.  Other vital signs are stable with blood pressure elevated into the 150s/80s-low 100s. Patient endorses intermittent chest pain which she describes as "grabbing".  Troponins are flat at 14 and 14, EKG shows A-fib with RBB and RVH and no signs of MI. PE unlikely in setting of supratherapeutic tx with warfarin (INR 3). Infectious cause of symptoms is less likely as patient remains afebrile, no infectious symptoms, and physical exam does not support infection, but does support for classic CHF. CXR shows no evidence of pneumonia, respiratory panel is negative, and WBC is WNL.  CXR shows prominent central pulmonary vessels, evidence of interstitial pulmonary edema and small bilateral pleural effusions.  BNP is elevated to 1633. CXR, elevated BNP, symptoms, and physical exam are  consistent with heart failure exacerbation. Last echo in July 2022 showed LVEF 50-55%, moderate LVH, moderate hypokinesis in multiple places, mildly reduced right ventricular function, normal structure and function of valve prosthesis, and  mild aortic stenosis. Home meds include Imdur 30 mg qd, metoprolol tartrate 50 mg BID, klor con 20 mEq BID.  Patient received 1 dose of IV Lasix 40 mg in the ED. Plan is to admit, diurese, and work up if patient has now decompensated heart failure or converted to HFrEF. Will consult cardiology given history of mechanical valve, see problem below. -Patient admitted to med telemetry, attending Dr. Andria Frames -Will consult cardiology in the morning -Echocardiogram ordered -Continue home Imdur 30 mg daily -Continue home metoprolol 100 mg twice daily -Strict I's and O's -Daily weights -Vitals per floor routine -Continuous cardiac monitoring -Keep oxygen saturation greater 92% -Supplemental oxygen as needed -Wean as tolerated -AM CBC, BMP -Regular diet -No DVT ppx needed given full anticoagulation (see below) -PT/OT to eval and treat  S/p mechanical mitral valve (1987)   Longterm anticoagulation Home medication is coumadin 5 mg qd on Sun/Tue/Th, 10 mg all other days. PT/INR today show INR therapeutic at 3. Patient reports that she did not take her warfarin today. -Coumadin 10 mg ordered for tonight -AM PT/INR -Will consult pharmacy for Coumadin dosing  Permanent a fib   RBBB EKG shows that patient has rate controlled permanent a fib, HR in 70s. Home meds include Cardizem 240 mg qd, metoprolol 50 mg twice daily, coumadin (see above for mitral valve).  -Continuous cardiac monitoring -Continue Cardizem to 40 mg daily -Continue metoprolol 50 mg twice daily -Continue Coumadin per pharmacy  CKD stage IIIb Patient has GFR of 32, creatinine of 1.6.  Baseline creatinine appears to be 1.5 -Avoid nephrotoxic agents -AM BMP  Hypertension BPs elevated this evening in the 150s-160s/80s-low 100s.  Home medications include metoprolol  tartrate 100 mg twice daily and Cardizem 240 mg daily -Continue home meds  GERD Home medication includes pantoprazole 40 mg qd. -Continue home pantoprazole  Prolonged  QTc QTc slightly elevated at 484 -Avoid QTc prolonging medications  FEN/GI: heart healthy Prophylaxis: on full dose anticoagulation for mechanical valve  Disposition: admit to cardiac telemetry for further work up and medical stabilization. Patient came from home, expect her to return home, though may need SNF.  History of Present Illness:  [name] is a 86 y.o. female presenting with SOB and chest pain. Patient reports that she has had worsening DOE for 3 months. Patient reports that cardiologist had mentioned doing a cath if she had CP/DOE. She had appt with coumadin clinic for 03/27/21. She had not yet made an appt with cardiology. She reports that left sided chest pain started this morning, describes it as "grabbing." CP intermittent, worse with exertion, new and different pain than she has experienced before. She endorses orthopnea (sleeps on 3-4 pillows), does not know dry weight, endorses peripheral edema. She denies smoking, quit at age 30. She does not drink EtOH. No recreational drug use. She has not had a BM for 2 days, normal for her. When offered miralax, patient declined.  Review Of Systems: Per HPI with the following additions: orthopnea  Review of Systems  Constitutional:  Positive for chills. Negative for fever.  Respiratory:  Positive for shortness of breath.   Cardiovascular:  Positive for chest pain and leg swelling.  Gastrointestinal:  Negative for abdominal pain, constipation and diarrhea.  Neurological:  Negative for headaches.    Patient Active Problem List   Diagnosis Date Noted   Unstable angina ([place]) 07/25/2020   Anemia 04/25/2020   Anorectal disorder 04/25/2020   Bloating 04/25/2020   Diarrhea 04/25/2020   Diverticular disease of colon 04/25/2020   Gastro-esophageal reflux disease without esophagitis 04/25/2020   Heart failure ([place]) 04/25/2020   Heartburn 04/25/2020   Hematochezia 04/25/2020   Neck pain 04/25/2020   Other specified symptoms and signs  involving the digestive system and abdomen 04/25/2020   Overactive bladder 04/25/2020   Personal history of colonic polyps 04/25/2020   Polyp of colon 04/25/2020   Spinal stenosis 04/25/2020   Weakness 04/25/2020   Leg pain 01/24/2020   Cirrhosis of liver ([place]) 06/22/2019   H/O right hemicolectomy 06/16/2018   Abnormal CT scan, liver 06/16/2018   Urge incontinence 01/08/2018   High risk social situation 06/27/2015   Chronic atrial fibrillation ([place])    CKD (chronic kidney disease), stage III ([place]) 05/31/2015   Hx of Tibial plateau fracture 2017 05/22/2015   Encounter for therapeutic drug monitoring 02/15/2013   Long term current use of anticoagulant therapy 05/03/2010   RBBB 02/16/2008   VENTRICULAR TACHYCARDIA 02/16/2008   History of mitral valve replacement - St Jude mechanical valve 02/16/2008   Hypertension 03/19/2006   RHINITIS, ALLERGIC 03/19/2006   Osteoarthrosis involving lower leg 03/19/2006   Osteoporosis 03/19/2006    Past Medical History: Past Medical History:  Diagnosis Date   Asthma    years ago   Chronic atrial fibrillation ([place])    Dilated cardiomyopathy ([place])    history of this, now resolved   Dysphagia    Hx   Fracture of tibial plateau 05/21/2015   GERD (gastroesophageal reflux disease)    GI bleed    Hemorrhoid 04/2014   bleeding   History of blood transfusion    Hypertension    Microcytic anemia    Nonsustained ventricular  tachycardia    Osteoporosis    Protein in urine    RBBB (right bundle branch block)    Rheumatic mitral valve disease     Past Surgical History: Past Surgical History:  Procedure Laterality Date   CHOLECYSTECTOMY  2005   COLONOSCOPY     COLONOSCOPY W/ POLYPECTOMY     EYE SURGERY  04/2014   HEMICOLECTOMY  2008   forpolyps   MITOMYCIN C APPLICATION Right [id number]   Procedure: MITOMYCIN C APPLICATION;  Surgeon: Marylynn Pearson, MD;  Location: [facility];  Service: Ophthalmology;  Laterality: Right;   MITRAL VALVE REPLACEMENT   1987   St Jude mechanical valve   TRABECULECTOMY Right 05/17/2014   Procedure: TRABECULECTOMY WITH MITOMYCIN RIGHT EYE;  Surgeon: Marylynn Pearson, MD;  Location: [facility];  Service: Ophthalmology;  Laterality: Right;   TUBAL LIGATION     US ECHOCARDIOGRAPHY  02/2008, 03/2006, 06/2004    Social History: Social History   Tobacco Use   Smoking status: Former    Packs/day: 1.50    Years: 15.00    Pack years: 22.50    Types: Cigarettes    Quit date: 1987    Years since quitting: 36.1   Smokeless tobacco: Never   Tobacco comments:    quit 1987  Vaping Use   Vaping Use: Never used  Substance Use Topics   Alcohol use: No   Drug use: No     Family History: Family History  Problem Relation Age of Onset   Cancer Father        prostate cancer   Hypertension Mother    Stroke Mother        CVA x 2   Cancer Sister        in the Bladder   Endometriosis Daughter    Stroke Sister    Blindness Sister    Hypertension Sister    Thyroid disease Daughter    Hypertension Daughter    Hypertension Daughter    Hypertension Daughter    Liver disease Daughter    Colon cancer Neg Hx    Breast cancer Neg Hx     Allergies and Medications: Allergies  Allergen Reactions   Esomeprazole Magnesium     REACTION: stomach upset, nausea   No current facility-administered medications on file prior to encounter.   Current Outpatient Medications on File Prior to Encounter  Medication Sig Dispense Refill   amoxicillin (AMOXIL) 500 MG capsule Take 2000 mg  1 hour prior to dental procedure 4 capsule 1   Cholecalciferol (VITAMIN D) 2000 units tablet Take 1 tablet (2,000 Units total) by mouth daily. 100 tablet 3   diltiazem (CARDIZEM CD) 240 MG 24 hr capsule Take 1 capsule (240 mg total) by mouth daily. 90 capsule 3   dorzolamidel-timolol (COSOPT) 22.3-6.8 MG/ML SOLN ophthalmic solution Place 1 drop into both eyes in the morning and at bedtime.  4   isosorbide mononitrate (IMDUR) 30 MG 24 hr tablet TAKE 1  TABLET(30 MG) BY MOUTH DAILY 90 tablet 3   LUMIGAN 0.01 % SOLN Place 1 drop into both eyes at bedtime.  0   metoprolol tartrate (LOPRESSOR) 100 MG tablet Take 1 tablet (100 mg total) by mouth 2 (two) times daily. 180 tablet 3   pantoprazole (PROTONIX) 40 MG tablet TAKE 1 TABLET(40 MG) BY MOUTH DAILY 90 tablet 3   polyethylene glycol (MIRALAX / GLYCOLAX) 17 g packet Take 17 g by mouth daily as needed for mild constipation.     potassium chloride (  KLOR-CON) 10 MEQ tablet Take 2 tablets (20 mEq total) by mouth daily. 180 tablet 3   warfarin (COUMADIN) 10 MG tablet Take 1/2 to 1 tablet by mouth once daily as directed by anticoagulation clinic 80 tablet 1    Objective: BP (!) 166/102    Pulse (!) 106    Temp 97.8 F (36.6 C) (Oral)    Resp 18    SpO2 98%  Exam: General: elderly, AAW, WNWD, Patient lying in bed, appears short of breath when trying to sit up, NAD Eyes: White sclera, clear conjunctiva Neck: JVD present Cardiovascular: Irregularly irregular rhythm, regular rate, no m/r/g, 2+ radial and DP pulses Respiratory: CTAB, good air movement, no crackles appreciated, no iWOB, no accessory muscle usage Gastrointestinal: Bowel sounds present, soft, nondistended, mild tenderness to palpation in the epigastric area Derm: Warm and dry Extremities: 2+ pitting edema of BLEs Neuro: No focal deficits Psych: Mood and affect appropriate for situation  Labs and Imaging: CBC BMET  Recent Labs  Lab 03/20/21 1340  WBC 7.3  HGB 11.4*  HCT 39.5  PLT 218   Recent Labs  Lab 03/20/21 1340  NA 141  K 4.2  CL 111  CO2 22  BUN 19  CREATININE 1.61*  GLUCOSE 105*  CALCIUM 9.6     EKG: A-fib, ventricular rate of 73 bpm, RBBB, RVH, QTc mildly elevated at 484  Precious Gilding, DO 03/20/2021, 6:10 PM PGY-1, [place] Intern pager: [phone], text pages welcome  FPTS Upper-Level Resident Addendum   I have independently interviewed and examined the patient. I have discussed the  above with the original author and agree with their documentation. I have made additional edits as necessary. Please see also any attending notes.   Gladys Damme, M.D. PGY-2, [facility] 03/20/2021 8:28 PM  FPTS Service pager: [phone] (text pages welcome through [facility])

## 2021-03-20 NOTE — ED Provider Triage Note (Signed)
Emergency Medicine Provider Triage Evaluation Note ? ?[name] , a 86 y.o. female  was evaluated in triage.  Pt complains of shortness of breath and chest pain.  Patient reports being short of breath for the last couple weeks, chest pain this morning.  Patient was seen at her PCP this morning, evaluated and sent over to the ED for further evaluation.  PCP concerned about patient being in A-fib versus heart failure.  Patient has history of stents being placed, per cardiologist, patient may need potential interventional procedure for recurrence of symptoms. ? ?Review of Systems  ?Positive: Chest pain, shortness of breath, leg swelling ?Negative: Nausea, vomiting, diarrhea, abdominal pain, fevers ? ?Physical Exam  ?BP (!) 155/80 (BP Location: Right Arm)   Pulse 73   Temp 97.8 ?F (36.6 ?C) (Oral)   Resp 18   SpO2 94%  ?Gen:   Awake, no distress   ?Resp:  Normal effort  ?MSK:   Moves extremities without difficulty  ?Other:  2+ bilateral pitting edema ? ?Medical Decision Making  ?Medically screening exam initiated at 1:23 PM.  Appropriate orders placed.  [name] was informed that the remainder of the evaluation will be completed by another provider, this initial triage assessment does not replace that evaluation, and the importance of remaining in the ED until their evaluation is complete. ? ? ?  ?Al Decant, PA-C ?03/20/21 1325 ? ?

## 2021-03-21 ENCOUNTER — Inpatient Hospital Stay (HOSPITAL_COMMUNITY): Payer: Medicare Other

## 2021-03-21 ENCOUNTER — Encounter: Payer: Self-pay | Admitting: Family Medicine

## 2021-03-21 DIAGNOSIS — N1832 Chronic kidney disease, stage 3b: Secondary | ICD-10-CM | POA: Diagnosis not present

## 2021-03-21 DIAGNOSIS — J9691 Respiratory failure, unspecified with hypoxia: Secondary | ICD-10-CM

## 2021-03-21 DIAGNOSIS — I5033 Acute on chronic diastolic (congestive) heart failure: Secondary | ICD-10-CM | POA: Diagnosis not present

## 2021-03-21 DIAGNOSIS — I509 Heart failure, unspecified: Secondary | ICD-10-CM | POA: Diagnosis not present

## 2021-03-21 DIAGNOSIS — I482 Chronic atrial fibrillation, unspecified: Secondary | ICD-10-CM | POA: Diagnosis not present

## 2021-03-21 DIAGNOSIS — I10 Essential (primary) hypertension: Secondary | ICD-10-CM

## 2021-03-21 DIAGNOSIS — J9601 Acute respiratory failure with hypoxia: Secondary | ICD-10-CM | POA: Diagnosis not present

## 2021-03-21 LAB — BASIC METABOLIC PANEL
Anion gap: 13 (ref 5–15)
BUN: 23 mg/dL (ref 8–23)
CO2: 22 mmol/L (ref 22–32)
Calcium: 9.6 mg/dL (ref 8.9–10.3)
Chloride: 105 mmol/L (ref 98–111)
Creatinine, Ser: 1.85 mg/dL — ABNORMAL HIGH (ref 0.44–1.00)
GFR, Estimated: 27 mL/min — ABNORMAL LOW (ref >60)
Glucose, Bld: 116 mg/dL — ABNORMAL HIGH (ref 70–99)
Potassium: 4.7 mmol/L (ref 3.5–5.1)
Sodium: 140 mmol/L (ref 135–145)

## 2021-03-21 LAB — ECHOCARDIOGRAM COMPLETE
AR max vel: 1.29 cm2
AV Area VTI: 1.48 cm2
AV Area mean vel: 1.18 cm2
AV Mean grad: 8 mmHg
AV Peak grad: 15.1 mmHg
Ao pk vel: 1.94 m/s
Calc EF: 42.8 %
Height: 68 in
S' Lateral: 3.9 cm
Single Plane A2C EF: 51.8 %
Single Plane A4C EF: 30.4 %
Weight: 2755.2 oz

## 2021-03-21 LAB — PROTIME-INR
INR: 2.9 — ABNORMAL HIGH (ref 0.8–1.2)
Prothrombin Time: 30.6 seconds — ABNORMAL HIGH (ref 11.4–15.2)

## 2021-03-21 LAB — CBC
HCT: 40.1 % (ref 36.0–46.0)
Hemoglobin: 12.1 g/dL (ref 12.0–15.0)
MCH: 27.1 pg (ref 26.0–34.0)
MCHC: 30.2 g/dL (ref 30.0–36.0)
MCV: 89.9 fL (ref 80.0–100.0)
Platelets: 196 10*3/uL (ref 150–400)
RBC: 4.46 MIL/uL (ref 3.87–5.11)
RDW: 15.9 % — ABNORMAL HIGH (ref 11.5–15.5)
WBC: 8.5 10*3/uL (ref 4.0–10.5)
nRBC: 0 % (ref 0.0–0.2)

## 2021-03-21 LAB — MAGNESIUM: Magnesium: 1.8 mg/dL (ref 1.7–2.4)

## 2021-03-21 MED ORDER — HYDRALAZINE HCL 25 MG PO TABS
25.0000 mg | ORAL_TABLET | Freq: Three times a day (TID) | ORAL | Status: AC
  Administered 2021-03-21 – 2021-03-22 (×3): 25 mg via ORAL
  Filled 2021-03-21 (×3): qty 1

## 2021-03-21 MED ORDER — SENNA 8.6 MG PO TABS
1.0000 | ORAL_TABLET | Freq: Every day | ORAL | Status: DC
  Administered 2021-03-21 – 2021-03-22 (×2): 8.6 mg via ORAL
  Filled 2021-03-21 (×4): qty 1

## 2021-03-21 MED ORDER — DICLOFENAC SODIUM 1 % EX GEL
4.0000 g | Freq: Four times a day (QID) | CUTANEOUS | Status: DC | PRN
  Administered 2021-03-21 – 2021-04-01 (×2): 4 g via TOPICAL
  Filled 2021-03-21: qty 100

## 2021-03-21 MED ORDER — WARFARIN - PHARMACIST DOSING INPATIENT
Freq: Every day | Status: DC
Start: 2021-03-21 — End: 2021-03-21

## 2021-03-21 MED ORDER — METOPROLOL TARTRATE 5 MG/5ML IV SOLN
5.0000 mg | Freq: Once | INTRAVENOUS | Status: DC
  Filled 2021-03-21: qty 5

## 2021-03-21 MED ORDER — ACETAMINOPHEN 325 MG PO TABS
650.0000 mg | ORAL_TABLET | Freq: Four times a day (QID) | ORAL | Status: DC | PRN
  Administered 2021-03-27: 650 mg via ORAL
  Filled 2021-03-21: qty 2

## 2021-03-21 MED ORDER — QUETIAPINE FUMARATE 25 MG PO TABS
25.0000 mg | ORAL_TABLET | Freq: Once | ORAL | Status: AC
  Administered 2021-03-21: 25 mg via ORAL
  Filled 2021-03-21: qty 1

## 2021-03-21 MED ORDER — FUROSEMIDE 10 MG/ML IJ SOLN
40.0000 mg | Freq: Two times a day (BID) | INTRAMUSCULAR | Status: DC
  Administered 2021-03-21 – 2021-03-22 (×2): 40 mg via INTRAVENOUS
  Filled 2021-03-21 (×2): qty 4

## 2021-03-21 MED ORDER — ALBUTEROL SULFATE (2.5 MG/3ML) 0.083% IN NEBU: 2.5000 mg | INHALATION_SOLUTION | Freq: Four times a day (QID) | RESPIRATORY_TRACT | Status: DC | PRN

## 2021-03-21 MED ORDER — MAGNESIUM SULFATE 2 GM/50ML IV SOLN
2.0000 g | Freq: Once | INTRAVENOUS | Status: AC
  Administered 2021-03-21: 2 g via INTRAVENOUS
  Filled 2021-03-21: qty 50

## 2021-03-21 MED ORDER — FUROSEMIDE 10 MG/ML IJ SOLN
80.0000 mg | Freq: Once | INTRAMUSCULAR | Status: AC
  Administered 2021-03-21: 80 mg via INTRAVENOUS
  Filled 2021-03-21: qty 8

## 2021-03-21 MED ORDER — HYDRALAZINE HCL 20 MG/ML IJ SOLN
10.0000 mg | Freq: Once | INTRAMUSCULAR | Status: DC
  Filled 2021-03-21: qty 1

## 2021-03-21 MED ORDER — IPRATROPIUM-ALBUTEROL 0.5-2.5 (3) MG/3ML IN SOLN: 3.0000 mL | Freq: Two times a day (BID) | RESPIRATORY_TRACT | Status: DC

## 2021-03-21 NOTE — Assessment & Plan Note (Signed)
Concern at this history of valve replacement and her report today that her cardiologist mention she might need cardiac catheterization and stent placement if she got more short of breath, is potentially placing her in risk for myocardial infarction/heart failure.  She is already showing some signs of heart failure with edema, dyspnea on exertion.  Surprisingly after discussion, she agreed to admission. ? ?Last?1. Left ventricular ejection fraction, by estimation, is 50 to 55%. The  ?left ventricle has low normal function. The left ventricle demonstrates  ?regional wall motion abnormalities (see scoring diagram/findings for  ?description). There is moderate  ?concentric left ventricular hypertrophy. Left ventricular diastolic  ?function could not be evaluated. There is moderate hypokinesis of the left  ?ventricular, mid-apical inferolateral wall and inferior wall. There is  ?mild hypokinesis of the left  ?ventricular, entire anterolateral wall.  ??2. Right ventricular systolic function is mildly reduced. The right  ?ventricular size is normal. There is normal pulmonary artery systolic  ?pressure.  ??3. Left atrial size was severely dilated.  ??4. Right atrial size was severely dilated.  ??5. The mitral valve has been repaired/replaced. not evaluated mitral  ?valve regurgitation. There is a mechanical valve present in the mitral  ?position. Procedure Date: 53. Echo findings are consistent with normal  ?structure and function of the mitral  ?valve prosthesis.  ??6. Tricuspid valve regurgitation is mild to moderate.  echocardiogram: ?

## 2021-03-21 NOTE — Plan of Care (Signed)
  Problem: Education: Goal: Ability to verbalize understanding of medication therapies will improve Outcome: Progressing   

## 2021-03-21 NOTE — Progress Notes (Signed)
ANTICOAGULATION CONSULT NOTE - Initial Consult ? ?Pharmacy Consult for Enoxaparin  ?Indication:  Mechanical Mitral Valve ? ?Allergies  ?Allergen Reactions  ? Esomeprazole Magnesium   ?  REACTION: stomach upset, nausea  ? ? ?Patient Measurements: ?Height: 5\' 8"  (172.7 cm) ?Weight: 78.1 kg (172 lb 3.2 oz) ?IBW/kg (Calculated) : 63.9 ? ?Vital Signs: ?Temp: 97.8 ?F (36.6 ?C) (03/02 1200) ?Temp Source: Oral (03/02 1200) ?BP: 155/112 (03/02 1200) ?Pulse Rate: 92 (03/02 1200) ? ?Labs: ?Recent Labs  ?  03/20/21 ?1340 03/20/21 ?1647 03/21/21 ?05/21/21  ?HGB 11.4*  --  12.1  ?HCT 39.5  --  40.1  ?PLT 218  --  196  ?LABPROT  --  30.8* 30.6*  ?INR  --  3.0* 2.9*  ?CREATININE 1.61*  --  1.85*  ?TROPONINIHS 14 14  --   ? ? ?Estimated Creatinine Clearance: 25.3 mL/min (A) (by C-G formula based on SCr of 1.85 mg/dL (H)). ? ? ?Medical History: ?Past Medical History:  ?Diagnosis Date  ? Asthma   ? years ago  ? Chronic atrial fibrillation (HCC)   ? Dilated cardiomyopathy (HCC)   ? history of this, now resolved  ? Dysphagia   ? Hx  ? Fracture of tibial plateau 05/21/2015  ? GERD (gastroesophageal reflux disease)   ? GI bleed   ? Hemorrhoid 04/2014  ? bleeding  ? History of blood transfusion   ? Hypertension   ? Microcytic anemia   ? Nonsustained ventricular tachycardia   ? Osteoporosis   ? Protein in urine   ? RBBB (right bundle branch block)   ? Rheumatic mitral valve disease   ? ? ?Medications:  ?Scheduled:  ? diltiazem  240 mg Oral Daily  ? dorzolamide-timolol  1 drop Both Eyes BID  ? hydrALAZINE  25 mg Oral Q8H  ? isosorbide mononitrate  30 mg Oral Daily  ? latanoprost  1 drop Both Eyes QHS  ? metoprolol tartrate  100 mg Oral BID  ? pantoprazole  40 mg Oral Daily  ? polyethylene glycol  17 g Oral Daily  ? senna  1 tablet Oral Daily  ? ? ?Assessment: ?Ms. Lobosco presents with a history of mechanical mitral valve replacement with long-term anticoagulation with warfarin. Pharmacy consulted to transition to enoxaparin d/t potential  procedure. Patient is currently therapeutically anticoagulated (INR 2.9). No dose of enoxaparin needed today. CBC is stable. No bleeding noted.  ? ?Goal of Therapy:  ?aPTT 0.6-1 seconds ?  ?Plan:  ?Initiate enoxaparin 80 mg SubQ q24h (based on weight and renal function). First dose of enoxaparin should be given once INR<2.5. Monitor CBC, Scr, s/sx of bleeding/clotting.  ? ?Maisie Fus, PharmD Candidate  ?03/21/2021,12:46 PM ? ? ?

## 2021-03-21 NOTE — Progress Notes (Addendum)
Pt is complaining of sharp pains in RLE 10/10. Pt states feels like a cramp almost asking for muscle rub for leg. Paged Dr. Melissa Noon awaiting call back. No new orders received. Informed by MD to apply heat to area. ?

## 2021-03-21 NOTE — Progress Notes (Addendum)
FPTS Interim Progress Note ? ?S: Paged by RN that patient was refusing medications (PO Metoprolol and Hydralazine) and saying she wanted to leave. ? ?I went to 6E and discussed with RN, then to the bedside to evaluate and discuss with the patient. She was breathing comfortably and in no distress. When I walked into the room, the patient pulled blanket over her head and initially refused to speak with me. I told her that I was the nighttime physician and wanted to know how I could help her. She said "Of all people, I trusted you. There is something going on here in this hospital they aren't telling me." I asked questions to assess her mentation and orientation and she said "Stop asking me those dumbass questions. I know my name, date of birth and where I am. If you're going to ask me something, make it substantial. You're treating me like I am bipolar."  ? ?I explained the necessity of her medications and that I am here to provide good care for her and she continued to refuse saying "If I die in this hospital, so be it." She said "Just leave me alone" multiple times and refused to speak further with me. I left the room with lights dimmed low, patient laying comfortably in bed with eyes closed.  ? ?I called and spoke with her daughter, Milon Dikes, who said this type of behavior has been common in the past in the nursing home where she has delusions at night that she is being drugged and tries to leave. Her daughter does not know of any confirmed diagnosis of dementia in Ms. Folmer. I told Ms. Manson Passey we will give her antihypertensives in the IV formulation and attempt to keep her comfortable through the night with minimal disturbances, which she agreed is a good plan. Ms. Manson Passey plans to be here early in the morning. I answered all questions to her satisfaction. ? ?O: ?BP (!) 172/91 (BP Location: Left Arm)   Pulse 88   Temp 99.2 ?F (37.3 ?C) (Axillary)   Resp 18   Ht 5\' 8"  (1.727 m)   Wt 78.1 kg   SpO2 100%   BMI  26.18 kg/m?   ?General: Laying in the bed comfortably with lights turned off ?CV: Regular rate, atrial fibrillation. ?Psych: Irritable affect, delirious ?Neuro: Pupils PERRLA, EOMI. No focal deficits noted. ? ?A/P: ?Delirium ?I believe Ms. Schmelzer' behavior is sun-downing. No concern for acute neurologic event as her speech is clear and fluent. She did not have these delusions during the daytime, and has a history of this happening at night. Unlikely to have delirium due to UTI as her UA was negative. ?She is generally well-appearing with stable vital signs.  ?- Delirium precautions ordered (address patient gently, minimize PM interruptions while patient is sleeping, avoid physical restraints including mitts) ?- Bed alarm to be turned on ? ?Hypertension ?Ultimate goal is to keep blood pressure under control, which had only recently been achieved today. BP 155/97 while in the room. Will administer IV antihypertensives (discussed conversions with pharmacy) as patient continues to refuse PO medications. ?- Hydralazine 10mg  IV x1 ?- Metoprolol 5mg  IV x1, can administer another dose in 6 hours if continued elevation in BP ? ?RN to page intern pager if any further concerns. ? ?Maisie Fus, DO ?03/21/2021, 10:27 PM ?PGY-1, [facility] ?Service pager [phone]  ?

## 2021-03-21 NOTE — Progress Notes (Signed)
? ?  Echocardiogram ?2D Echocardiogram has been performed. ? ?[name] ?03/21/2021, 2:37 PM ?

## 2021-03-21 NOTE — Progress Notes (Signed)
?   03/21/21 0728  ?Assess: MEWS Score  ?Temp 97.8 ?F (36.6 ?C)  ?BP (!) 180/102  ?Pulse Rate 91  ?ECG Heart Rate 92  ?Resp (!) 26  ?Level of Consciousness Alert  ?SpO2 97 %  ?O2 Device Nasal Cannula  ?O2 Flow Rate (L/min) 2 L/min  ?Assess: MEWS Score  ?MEWS Temp 0  ?MEWS Systolic 0  ?MEWS Pulse 0  ?MEWS RR 2  ?MEWS LOC 0  ?MEWS Score 2  ?MEWS Score Color Yellow  ?Assess: if the MEWS score is Yellow or Red  ?Were vital signs taken at a resting state? Yes  ?Focused Assessment No change from prior assessment  ?Early Detection of Sepsis Score *See Row Information* Medium  ?MEWS guidelines implemented *See Row Information* Yes  ?Treat  ?Pain Scale 0-10  ?Pain Score 10  ?Pain Type Acute pain  ?Pain Location Leg  ?Pain Orientation Right  ?Pain Radiating Towards  ?(pain in right thigh only)  ?Pain Descriptors / Indicators Throbbing  ?Pain Frequency Constant  ?Pain Onset On-going  ?Pain Intervention(s) Repositioned;Other (Comment) ?(RN has notified MD of pain)  ?Take Vital Signs  ?Increase Vital Sign Frequency  Yellow: Q 2hr X 2 then Q 4hr X 2, if remains yellow, continue Q 4hrs  ?Escalate  ?MEWS: Escalate Yellow: discuss with charge nurse/RN and consider discussing with provider and RRT  ?Notify: Charge Nurse/RN  ?Name of Charge Nurse/RN Notified Shanda Bumps, RN  ?Date Charge Nurse/RN Notified 03/21/21  ?Time Charge Nurse/RN Notified 1230  ?Notify: Provider  ?Provider Name/Title Maralyn Sago Jones/DO  ?Date Provider Notified 03/21/21  ?Time Provider Notified 0730  ?Notification Type Face-to-face  ?Notification Reason  ?(High BP)  ?Provider response  ?(Given morning BP med earlier)  ?Date of Provider Response 03/21/21  ?Time of Provider Response 0730  ?Document  ?Patient Outcome Stabilized after interventions  ?Progress note created (see row info) Yes  ? ? ?

## 2021-03-21 NOTE — Consult Note (Addendum)
Cardiology Consultation:   Patient ID: [name] MRN: [id number]; DOB: 1935-05-27  Admit date: 03/20/2021 Date of Consult: 03/21/2021  PCP:  Dickie La, MD   [facility] Providers Cardiologist:  Cristopher Peru, MD   {    Patient Profile:   [name] is a 86 y.o. female with a hx of St. Jude mechanical MVR in 1987, chronic afib, chronic anticoagulation with coumadin, chronic diastolic CHF, HTN, mild aortic stenosis, GERD, NSVT, anemia, RVVV, CKD stage III who is being seen 03/21/2021 for the evaluation of acute diastolic heart failure at the request of Dr. Andria Frames.  History of Present Illness:   [name] is an 86 year old female with above medical history. Per chart review, patient had a mechanical MVR in 1987 and has been followed by Dr. Lovena Le for several years for treatment of chronic atrial fibrillation and monitoring of MVR. In May 2017 patient presented to the ED for evaluation of chest pain and was admitted. Patient underwent a nuclear stress test that was a low risk study without inducible or reversible ischemia. Echocardiogram on 06/02/2015 showed LVEF 50-55%, mild LVH.   Patient was admitted to the hospital from 07/25/2020-07/27/2020 for evaluation of chest pain. Echocardiogram on 07/25/2020 showed EF 50-55%, moderate LVH, severely dilated LA and RA, normal function and placement of the mitral valve prosthesis, mild-moderate tricuspid valve regurgitation, mild aortic valve stenosis. Nuclear stress test on 07/26/2020 was negative for ischemia, had normal LV wall motion, an EF of 50%, and was a low risk study.   Patient presented to the ED on 3/1 complaining of shortness of breath. Patient was seen by her PCP earlier that day for similar symptoms, and they recommended she presents to the ED for further evaluation. Labs in the ED showed Na 141, K 4.2, creatinine 1.61, eGFR 32. Hemoglobin 11.4, WBC 7.3. hsTn 14>>14. BNP elevated to 1633.6. EKG showed atrial fibrillation with a RBBB. CXR  showed mild interstitial pulmonary edema, small bilateral pleural effusions. Patient was started on IV lasix, and cardiology was asked to consult.   On interview, patient reports that she has noticed worsening SOB on exertion for the past 3 months. Also reports orthopnea. Denies any chest pain. Occasional palpitations, mild cough. Denies any fevers, vomiting. Has been constipated for the past 3 days but had multiple bowel movements today that helped her abdominal pain  Past Medical History:  Diagnosis Date   Asthma    years ago   Chronic atrial fibrillation ([place])    Dilated cardiomyopathy ([place])    history of this, now resolved   Dysphagia    Hx   Fracture of tibial plateau 05/21/2015   GERD (gastroesophageal reflux disease)    GI bleed    Hemorrhoid 04/2014   bleeding   History of blood transfusion    Hypertension    Microcytic anemia    Nonsustained ventricular tachycardia    Osteoporosis    Protein in urine    RBBB (right bundle branch block)    Rheumatic mitral valve disease     Past Surgical History:  Procedure Laterality Date   CHOLECYSTECTOMY  2005   COLONOSCOPY     COLONOSCOPY W/ POLYPECTOMY     EYE SURGERY  04/2014   HEMICOLECTOMY  2008   forpolyps   MITOMYCIN C APPLICATION Right 08/06/5499   Procedure: MITOMYCIN C APPLICATION;  Surgeon: Marylynn Pearson, MD;  Location: [facility];  Service: Ophthalmology;  Laterality: Right;   MITRAL VALVE REPLACEMENT  1987  St Jude mechanical valve   TRABECULECTOMY Right 05/17/2014   Procedure: TRABECULECTOMY WITH MITOMYCIN RIGHT EYE;  Surgeon: Marylynn Pearson, MD;  Location: [facility];  Service: Ophthalmology;  Laterality: Right;   TUBAL LIGATION     US ECHOCARDIOGRAPHY  02/2008, 03/2006, 06/2004     Home Medications:  Prior to Admission medications   Medication Sig Start Date End Date Taking? Authorizing Provider  Cholecalciferol (VITAMIN D) 2000 units tablet Take 1 tablet (2,000 Units total) by mouth daily. 06/10/17  Yes Dickie La, MD   diltiazem (CARDIZEM CD) 240 MG 24 hr capsule Take 1 capsule (240 mg total) by mouth daily. 10/11/20  Yes Evans Lance, MD  dorzolamidel-timolol (COSOPT) 22.3-6.8 MG/ML SOLN ophthalmic solution Place 1 drop into both eyes in the morning and at bedtime. 03/02/17  Yes [provider]  isosorbide mononitrate (IMDUR) 30 MG 24 hr tablet TAKE 1 TABLET(30 MG) BY MOUTH DAILY Patient taking differently: Take 30 mg by mouth daily. 01/22/21  Yes Evans Lance, MD  LUMIGAN 0.01 % SOLN Place 1 drop into both eyes at bedtime. 01/26/17  Yes [provider]  metoprolol tartrate (LOPRESSOR) 100 MG tablet Take 1 tablet (100 mg total) by mouth 2 (two) times daily. 08/29/20 05/25/21 Yes Evans Lance, MD  pantoprazole (PROTONIX) 40 MG tablet TAKE 1 TABLET(40 MG) BY MOUTH DAILY Patient taking differently: Take 40 mg by mouth daily. 10/12/20  Yes Dickie La, MD  polyethylene glycol (MIRALAX / GLYCOLAX) 17 g packet Take 17 g by mouth daily as needed for mild constipation. 07/27/20  Yes Almyra Deforest, PA  potassium chloride (KLOR-CON) 10 MEQ tablet Take 2 tablets (20 mEq total) by mouth daily. Patient taking differently: Take 20 mEq by mouth 2 (two) times daily. 10/15/20 05/25/21 Yes Evans Lance, MD  warfarin (COUMADIN) 10 MG tablet Take 1/2 to 1 tablet by mouth once daily as directed by anticoagulation clinic Patient taking differently: Take 5-10 mg by mouth See admin instructions. 5m once daily on Sunday's, Tuesday's, and Thursday's, and 112monce daily on all other days of the week 12/24/20  Yes TaEvans LanceMD    Inpatient Medications: Scheduled Meds:  diltiazem  240 mg Oral Daily   dorzolamide-timolol  1 drop Both Eyes BID   hydrALAZINE  25 mg Oral Q8H   isosorbide mononitrate  30 mg Oral Daily   latanoprost  1 drop Both Eyes QHS   metoprolol tartrate  100 mg Oral BID   pantoprazole  40 mg Oral Daily   polyethylene glycol  17 g Oral Daily   senna  1 tablet Oral Daily   Continuous  Infusions:  PRN Meds: acetaminophen, diclofenac Sodium  Allergies:    Allergies  Allergen Reactions   Esomeprazole Magnesium     REACTION: stomach upset, nausea    Social History:   Social History   Socioeconomic History   Marital status: Divorced    Spouse name: Not on file   Number of children: 5   Years of education: 12   Highest education level: High school graduate  Occupational History   Occupation: Retired  Tobacco Use   Smoking status: Former    Packs/day: 1.50    Years: 15.00    Pack years: 22.50    Types: Cigarettes    Quit date: 1987    Years since quitting: 36.1   Smokeless tobacco: Never   Tobacco comments:    quit 1987  Vaping Use   Vaping Use: Never used  Substance and Sexual Activity   Alcohol use: No   Drug use: No   Sexual activity: Not Currently  Other Topics Concern   Not on file  Social History Narrative   Patient lives alone in [place].   Patient has 3 children who live near her and offer support. 2 live up [place].   Patient is active in [place] and enjoys walking for exercise.    Social Determinants of Health   Financial Resource Strain: Not on file  Food Insecurity: Not on file  Transportation Needs: Not on file  Physical Activity: Not on file  Stress: Not on file  Social Connections: Not on file  Intimate Partner Violence: Not on file    Family History:    Family History  Problem Relation Age of Onset   Cancer Father        prostate cancer   Hypertension Mother    Stroke Mother        CVA x 2   Cancer Sister        in the Bladder   Endometriosis Daughter    Stroke Sister    Blindness Sister    Hypertension Sister    Thyroid disease Daughter    Hypertension Daughter    Hypertension Daughter    Hypertension Daughter    Liver disease Daughter    Colon cancer Neg Hx    Breast cancer Neg Hx      ROS:  Please see the history of present illness.   All other ROS reviewed and negative.     Physical Exam/Data:    Vitals:   03/21/21 0748 03/21/21 0913 03/21/21 0937 03/21/21 1200  BP:  (!) 165/92 (!) 180/107 (!) 155/112  Pulse:   (!) 104 92  Resp:    (!) 27  Temp:    97.8 F (36.6 C)  TempSrc:    Oral  SpO2: 97%  96%   Weight:      Height:        Intake/Output Summary (Last 24 hours) at 03/21/2021 1213 Last data filed at 03/21/2021 1000 Gross per 24 hour  Intake 360 ml  Output 550 ml  Net -190 ml   Last 3 Weights 03/21/2021 03/20/2021 03/20/2021  Weight (lbs) 172 lb 3.2 oz 175 lb 11.2 oz 175 lb 6.4 oz  Weight (kg) 78.109 kg 79.697 kg 79.561 kg     Body mass index is 26.18 kg/m.  General:  Patient is sitting in the chair with her legs elevated. No acute distress HEENT: normal Neck: neck is supple with normal range of motion  Vascular: Radial pulses 2+ bilaterally  Cardiac:  Irregular rate and rhythm, mechanical S1 click, grade 2/6 systolic murmur at LUSB.  Lungs:  clear to auscultation bilaterally, no wheezing, rhonchi or rales  Abd: soft, nontender, no hepatomegaly  Ext: no edema Musculoskeletal:  No deformities, BUE and BLE strength normal and equal Skin: warm and dry  Neuro:  CNs 2-12 intact, no focal abnormalities noted Psych:  Normal affect   EKG:  The EKG was personally reviewed and demonstrates:  Atrial fibrillation, RBBB Telemetry:  Telemetry was personally reviewed and demonstrates:  Atrial fibrillation, HR in 80s-90s  Relevant CV Studies: Nuclear Stress Test 07/26/2020 IMPRESSION: 1. Negative for ischemia. Chronic inferior wall fixed defect favor attenuation over scar.   2. Normal left ventricular wall motion.   3. Left ventricular ejection fraction 50%   4. Non invasive risk stratification*: Low   Echocardiogram 07/25/2020    1. Left ventricular  ejection fraction, by estimation, is 50 to 55%. The  left ventricle has low normal function. The left ventricle demonstrates  regional wall motion abnormalities (see scoring diagram/findings for  description). There is moderate   concentric left ventricular hypertrophy. Left ventricular diastolic  function could not be evaluated. There is moderate hypokinesis of the left  ventricular, mid-apical inferolateral wall and inferior wall. There is  mild hypokinesis of the left  ventricular, entire anterolateral wall.   2. Right ventricular systolic function is mildly reduced. The right  ventricular size is normal. There is normal pulmonary artery systolic  pressure.   3. Left atrial size was severely dilated.   4. Right atrial size was severely dilated.   5. The mitral valve has been repaired/replaced. not evaluated mitral  valve regurgitation. There is a mechanical valve present in the mitral  position. Procedure Date: 29. Echo findings are consistent with normal  structure and function of the mitral  valve prosthesis.   6. Tricuspid valve regurgitation is mild to moderate.   7. The aortic valve is calcified. There is mild calcification of the  aortic valve. Aortic valve regurgitation is mild. Mild aortic valve  stenosis.   8. The inferior vena cava is normal in size with greater than 50%  respiratory variability, suggesting right atrial pressure of 3 mmHg.   Comparison(s): Prior images reviewed side by side. Changes from prior  study are noted. Wall motion better seen on current study with echo  contrast.   Laboratory Data:  High Sensitivity Troponin:   Recent Labs  Lab 03/20/21 1340 03/20/21 1647  TROPONINIHS 14 14     Chemistry Recent Labs  Lab 03/20/21 1340 03/21/21 0247  NA 141 140  K 4.2 4.7  CL 111 105  CO2 22 22  GLUCOSE 105* 116*  BUN 19 23  CREATININE 1.61* 1.85*  CALCIUM 9.6 9.6  MG  --  1.8  GFRNONAA 32* 27*  ANIONGAP 8 13    No results for input(s): PROT, ALBUMIN, AST, ALT, ALKPHOS, BILITOT in the last 168 hours. Lipids No results for input(s): CHOL, TRIG, HDL, LABVLDL, LDLCALC, CHOLHDL in the last 168 hours.  Hematology Recent Labs  Lab 03/20/21 1340 03/21/21 0247  WBC  7.3 8.5  RBC 4.26 4.46  HGB 11.4* 12.1  HCT 39.5 40.1  MCV 92.7 89.9  MCH 26.8 27.1  MCHC 28.9* 30.2  RDW 16.0* 15.9*  PLT 218 196   Thyroid No results for input(s): TSH, FREET4 in the last 168 hours.  BNP Recent Labs  Lab 03/20/21 1340  BNP 1,633.6*    DDimer No results for input(s): DDIMER in the last 168 hours.   Radiology/Studies:  DG Chest 2 View  Result Date: 03/21/2021 CLINICAL DATA:  Shortness of breath EXAM: CHEST - 2 VIEW COMPARISON:  03/20/2021 FINDINGS: Similar enlargement of the cardiomediastinal silhouette. Small bilateral pleural effusions with adjacent atelectasis. Persistent interstitial changes that may reflect edema. No pneumothorax. IMPRESSION: Stable cardiomegaly. Similar small pleural effusions with adjacent atelectasis and probable mild interstitial edema. Electronically Signed   By: Macy Mis M.D.   On: 03/21/2021 09:16   DG Chest 2 View  Result Date: 03/20/2021 CLINICAL DATA:  Shortness of breath EXAM: CHEST - 2 VIEW COMPARISON:  Previous studies including the examination of 07/25/2020 FINDINGS: Transverse diameter of heart is increased. Central pulmonary vessels are more prominent. There is subtle increase in interstitial markings in the mid and lower lung fields, more so on the right side. There is blunting of  both lateral CP angles. There is no pneumothorax. Metallic sutures seen in the sternum. IMPRESSION: Cardiomegaly. Central pulmonary vessels are more prominent suggesting mild CHF. Subtle increase in interstitial markings in the mid and lower lung fields may suggest mild interstitial pulmonary edema. Small bilateral pleural effusions. Electronically Signed   By: Elmer Picker M.D.   On: 03/20/2021 13:52   DG Abd 1 View  Result Date: 03/20/2021 CLINICAL DATA:  Abdominal pain EXAM: ABDOMEN - 1 VIEW COMPARISON:  02/07/2006 FINDINGS: Bowel gas pattern is nonspecific. Stomach is not distended. No abnormal masses are seen. Surgical clips are seen in  the gallbladder fossa. Heart is enlarged in size. Degenerative changes are noted in the lumbar spine. IMPRESSION: Nonspecific bowel gas pattern. Electronically Signed   By: Elmer Picker M.D.   On: 03/20/2021 20:48     Assessment and Plan:   Acute on chronic HFpEF  Acute hypoxic respiratory failure  Patient presented complaining of shortness of breath that has been worsening for 3 months, orthopnea.  - BNP elevated to 1633.6. hsTn 14>>14 - EKG showed atrial fibrillation, RBBB - CXR showed mild pulmonary edema and small bilateral pleural effusions  - Patient currently on 2L oxygen via nasal cannula, oxygen saturation stable  - Most recent echocardiogram was on 07/26/2020 and showed EF 50-55%, moderate LVH. Stress test on 07/26/20 was negative for ischemia  - Repeat echocardiogram ordered - Patient given 1 dose of lasix 40 mg IV yesterday, I/Os incomplete but patient reported good urine output  - Increased lasix to 80 mg IV for one dose this AM. Will start IV lasix 40 mg BID - Patient reported that her cardiologist mentioned a possible heart cath if her symptoms continue. Question the benefit of a LHC as patient had a recent negative stress test, patient is on coumadin, and has poor renal function. If echo shows new WMA, may consider LHC but unlikely  - Continue metoprolol, imdur, hydralazine  Permanent A-fib  - Continue diltiazem cd 240 mg daily  - Continue lopressor 100 mg BID  - Coumadin dosed per pharmacy  - Rate is well controlled on telemetry, recorded vital signs  HTN  - Continue diltiazem, hydralazine, and metoprolol   S/p mechanical mitral valve (1987) on long term anticoagulation  - Coumadin dosed per pharmacy   Otherwise per primary  - Abdominal pain  - Constipation  - Right thigh pain  - GERD - AKI on CKD stage III     Risk Assessment/Risk Scores:    New York Heart Association (NYHA) Functional Class NYHA Class III  CHA2DS2-VASc Score = 5   This indicates a  7.2% annual risk of stroke. The patient's score is based upon: CHF History: 1 HTN History: 1 Diabetes History: 0 Stroke History: 0 Vascular Disease History: 0 Age Score: 2 Gender Score: 1       For questions or updates, please contact [place] Please consult [email or web address] for contact info under    Signed, Margie Billet, PA-C  03/21/2021 12:13 PM   Personally seen and examined. Agree with above.  86 year old with mechanical mitral valve with chronic kidney disease stage IIIa here with acute on chronic diastolic heart failure.   Shortness of breath presenting symptom.  Creatinine 1.61 on admission.  BNP 1600.  EKG personally reviewed shows atrial fibrillation with right bundle branch block.  Chest x-ray personally reviewed and interpreted showed mild interstitial pulmonary edema.  Small effusions.  IV Lasix was begun.  Prior echocardiogram showed EF of 50%.  Recent nuclear stress test July 2022 shows no ischemia.  Low restudy.  Currently she is sitting up in her chair, mild increased work of breathing noted.  Mechanical S1 irregularly irregular, 2/6 systolic murmur.  Troponins are negative.  Creatinine has increased from 1.61-1.85  Assessment and plan:  Acute on chronic heart failure diastolic with hypoxic respiratory failure - 2 L nasal cannula.  Lasix was increased to 40 mg twice daily. - I would avoid heart catheterization if possible.  -Lets continue to monitor creatinine.  I would expect a gentle increase in creatinine with diuresis.  We will follow. -Repeat echocardiogram ordered.  Previously 50% EF.  Permanent atrial fibrillation - Currently on both diltiazem as well as Lopressor for rate control.  Doing well.  Medication dosages as above reviewed.  Chronic anticoagulation - Coumadin.  Goal INR 2.5-3.5.  No signs of bleeding.  Mechanical mitral valve Saint Jude 1987 - Coumadin.  Sharp click.  Functioning well.  Candee Furbish, MD

## 2021-03-21 NOTE — Progress Notes (Addendum)
Paged by RN that patient was trying to get out of the bed and leave. ? ?Went to bedside and patient was sitting on bedside commode with RN and NT beside her. Patient stating similar sentiments from earlier in the evening that she wants to go home. She was eventually redirectable and understood leaving in the middle of the night against medical advice was not the best option.  ? ?After lengthy discussion with patient, she agreed to take PO metoprolol, hydralazine with the understanding that we are trying to keep her blood pressure under control. She also agreed to take PO Seroquel.  I helped her get into bed and she took medications without difficulty and was resting comfortably when I left the room. ? ?A/P: ?Delirium: ?-Delirium precautions. Minimize entrances into the room this evening. ?- PO Seroquel 25mg  x1 administered (EKG from 3/1 reviewed- although challenging to calculate QT interval due to atrial fibrillation with varied intervals from beat to beat) ?- Bed alarm turned on ? ?Hypertension ?BP in 160s/90s. Stable, but still significantly elevated. ?- PO Metoprolol and hydralazine administered ?- Continue cardiac monitoring. Will monitor VS throughout the evening. ? ?AoC CHF in setting of HFpEF with acute hypoxic resp failure ?- 2L [place], SpO2 >92% ?- Monitor UOP with diuresis ? ?RN to page if any concerns. ? ? , DO ?03/21/2021, 12:02 AM ?PGY-1, [facility] ?Service pager [phone] ?

## 2021-03-21 NOTE — Progress Notes (Addendum)
Family Medicine Teaching Service ?Daily Progress Note ?Intern Pager: [phone] ? ?[name] Medical record number: [id number] ?Date of birth: 04-06-1935 Age: 86 y.o. Gender: female ? ?Primary Care Provider: Dickie La, MD ?Consultants: Cardiology ?Code Status: Full ? ?Pt Overview and Major Events to Date:  ?03/20/2021-admitted ? ?Assessment and Plan: ?Patient is an 86 year old female presenting with dyspnea on exertion and chest pain.  PMH significant for HFpEF EF, s/p mechanical mitral valve on Coumadin, permanent A-fib, HTN, CKD stage III, osteoporosis, history of right hemicolectomy, GERD ? ?DOE and CP  acute on chronic CHF in setting of new HFrEF  acute proximal respiratory failure ?Patient was on 2 L oxygen via nasal cannula when I entered the room with saturations of 100% and normal respiratory rate.  I turned oxygen down to 1 L with saturations in the mid 90s, I then turned her down to 0 L and patient desatted to 90-92%.  I left patient on 1 L at 96% saturation. Patient received Lasix 80 mg IV yesterday followed by Lasix 40 mg IV in the evening.  She had total urine output of 1.45 L last 24 hours.  Weight is 78.5 kg from 78.1 kg yesterday and 79.6 kg on admission.  Cardiology was consulted yesterday and recommended continued diuresis and continuation of home medications.  Echocardiogram completed yesterday and showed ejection fraction of 35 to 40% with global hypokinesis, moderate left ventricular hypertrophy, left atrium found to be massively dilated and right atrium severely dilated.  Echocardiogram showed normal structure and function of mitral valve prosthesis. Pt does not seem to have responded to the 80mg  and 40 mg IV lasix yesterday however, we will not go up on lasix today d/t worsening kidney function. Will await cardiology recommendations.  ?-Cardiology consulted, appreciate recs ?-d/c lasix d/t AKI and decreased GFR ?-Continue home Imdur 30 mg daily ?-Continue home metoprolol 100  mg twice daily ?-Strict I's and O's ?-Daily weights ?-A.m. BMP ? ?Hypertension ?BP was elevated persistently since arrival so yesterday hydralazine 25 mg 3 times daily was added to patient's home regimen of metoprolol tartrate 100 mg twice daily and Cardizem 40 mg daily.  BP this morning of 142/91. ?-Continue home metoprolol ?-d/c cardizen d/t new dx of HErEF ?-Continue hydralazine 25 mg 3 times daily  ? ?Delirium ?Overnight patient became delirious, refused nighttime medications and according to daughter has history of sundowning.  Patient was given Seroquel 25 mg once. Pt is somnolent this morning.  ?-Delirium precautions ? ?Constipation ?-Continue MiraLAX and senna daily ? ?S/p mechanical mitral valve  persistent A-fib ?Home medications include Coumadin 5 mg on Sunday/Tuesday/Thursday, 10 mg on other days, Cardizem 40 mg daily, metoprolol 50 mg twice daily.  Yesterday warfarin was not given with plan to switch to lovenox when appropriate per INR.  INR 4.4 this morning. ?-Continue plan for Lovenox per pharmacy ?-Continue home metoprolol  ?-decrease cardizem d/t new dx of HErEF ?-Continuous cardiac monitoring ? ?AKI on CKD stage III ?Creatinine 1.85> 2.87.  Baseline around 1.5.  GFR 27> 16. ?-A.m. BMP ? ?GERD ?-Continue home pantoprazole 40 mg daily ? ?Prolonged Qtc ?QTc slightly elevated at 484 ?-Avoid QTc prolonging medications ? ? ? ?FEN/GI: Regular diet ?PPx: Lovenox ?Dispo: Home pending clinical improvement ? ?Subjective:  ?Patient is lying in bed, unable to answer questions, somewhat somnolent ? ?Objective: ?Temp:  [97.5 ?F (36.4 ?C)-98 ?F (36.7 ?C)] 97.8 ?F (36.6 ?C) (03/02 1200) ?Pulse Rate:  [59-106] 92 (03/02 1200) ?Resp:  [17-27] 27 (03/02  1200) ?BP: (155-199)/(76-118) 155/112 (03/02 1200) ?SpO2:  [90 %-100 %] 90 % (03/02 1130) ?FiO2 (%):  [28 %] 28 % (03/02 0748) ?Weight:  [78.1 kg-79.7 kg] 78.1 kg (03/02 [id number]) ?Physical Exam: ?General: Patient lying in bed, somewhat somnolent ?Cardiovascular:  Irregularly irregular ?Respiratory: Breathing comfortably on 1 L oxygen via nasal cannula, no wheezing ?Extremities: 1+ pitting edema of right lower extremity, 0.50+ pitting edema of the left lower extremity ? ?Laboratory: ?Recent Labs  ?Lab 03/20/21 ?1340 03/21/21 ?[id number]  ?WBC 7.3 8.5  ?HGB 11.4* 12.1  ?HCT 39.5 40.1  ?PLT 218 196  ? ?Recent Labs  ?Lab 03/20/21 ?1340 03/21/21 ?[id number]  ?NA 141 140  ?K 4.2 4.7  ?CL 111 105  ?CO2 22 22  ?BUN 19 23  ?CREATININE 1.61* 1.85*  ?CALCIUM 9.6 9.6  ?GLUCOSE 105* 116*  ? ? ? ? ?Imaging/Diagnostic Tests: ?Echocardiogram completed yesterday and showed ejection fraction of 35 to 40% with global hypokinesis, moderate left ventricular hypertrophy, left atrium found to be massively dilated and right atrium severely dilated.  Echocardiogram showed normal structure and function of mitral valve prosthesis.  ? ?Precious Gilding, DO ?03/21/2021, 1:38 PM ?PGY-1, [place] ?[place] Intern pager: [phone], text pages welcome ? ?

## 2021-03-21 NOTE — Progress Notes (Signed)
Family Medicine Teaching Service ?Daily Progress Note ?Intern Pager: [phone] ? ?[name] Medical record number: [id number] ?Date of birth: July 21, 1935 Age: 86 y.o. Gender: female ? ?Primary Care Provider: Dickie La, MD ?Consultants: Cardiology ?Code Status: Full ? ?Pt Overview and Major Events to Date:  ?03/20/2021-admitted ? ?Assessment and Plan: ?Patient is an 86 year old female presenting with dyspnea on exertion and chest pain.  PMH significant for HFpEF, s/p mechanical mitral valve on Coumadin, permanent A-fib, HTN, CKD stage III, osteoporosis, history of right hemicolectomy, cirrhosis, GERD ? ?DOE and CP  acute on chronic CHF in setting of HFpEF  acute hypoxic respiratory failure ?Patient is currently on 2 L  with oxygen saturations in mid 90s and appears short of breath and uncomfortable. Pt is has expiratory wheezing today which was not present on admission. She received 2 dounebs overnight which seemed to help. Patient was given Lasix 40 mg IV yesterday evening and had a documented UOP of 450 mL however per nursing, patient had 2 episodes of urination over night which were not caught by the pure wick.  Patient is unsure of her dry weight, was 79.6 kg on admission, will continue to monitor.  Last echocardiogram was July 2022 which showed LVEF of 50-55%.  We will get repeat echo today.  As patient appears significantly more uncomfortable this morning, repeat CRX was ordered which showed worsening small pleural effusions and intestinal edema. ?-Cardiology consulted ?-Follow-up on echocardiogram today ?-repeat CXR ?-IV Lasix 80 milligrams today ?-Continue home Imdur 30 mg daily ?-Continue home metoprolol 100 mg twice daily ?-albuterol nebs q6h prn ?-Strict I's and O's ?-Daily weights ?-A.m. BMP ? ?Hypertension ?BP has been elevated since arrival, 180/102 this morning. Pt just received home meds. Will add hydralazine 25 mg TID. ?-Continue home metoprolol tartrate 100 mg twice daily and  Cardizem to 40 mg daily ?-Start Hydralazine 25 mg TID ? ?Right thigh pain ?Pain sounds cramping in nature. Potassium and sodium WNL. Magnesium 1.8 which is lower end of normal.  ?-Magnesium 2 g IV ?-K pad ?-Voltaren gel ?-tylenol 650 mg PRN ? ?Abdominal pain  constipation ?Overnight, night team was called to see patient due to abdominal pain.  UA was negative for UTI.  KUB was unrevealing.  Patient had not had BM in a few days so  MiraLAX was started.  Today patient states abdominal pain is gone. She has not yet had a BM. ?-miralax daily ?-senna daily ? ?S/p mechanical mitral valve (1987)  long-term anticoagulation ?Home medication is Coumadin 5 mg daily on Sunday/Tuesday/Thursday, 10 mg on other days.  PT/INR today of 3.9. Will switch anticoagulation to Lovenox today incase of future procedure ?-Lovenox per pharmacy  ? ?Permanent A-fib  RBBB ?EKG shows patient is rate controlled permanent A-fib with heart rate in 70s.  Home meds include Cardizem to 40 mg daily, metoprolol 50 mg twice daily, Lovenox per pharmacy ?-Continue home medications ?-Continuous cardiac monitoring ? ? ?AKI on CKD stage III ?Creatinine bumped up to 1.85 from 1.61 yesterday.  Baseline appears to be around 1.5.  GFR 32> 27. ?-A.m. BMP ? ?GERD ?-Continue home pantoprazole 40 mg daily ? ?Prolonged QTc ?QTc slightly elevated at 484 ?-Avoid QTc prolonging medications ? ? ?FEN/GI: Regular diet ?PPx: On warfarin, will switch to Lovenox  ?Dispo: Home pending clinical improvement ? ?Subjective:  ?Pt states she is feeling SOB today, no better than yesterday. States abdominal pain is gone. Pt is not complaining of right leg pin in her thigh which  started this morning. She describes it as cramping in nature.  ? ?Objective: ?Temp:  [97.5 ?F (36.4 ?C)-98 ?F (36.7 ?C)] 97.5 ?F (36.4 ?C) (03/02 [id number]) ?Pulse Rate:  [59-127] 89 (03/02 [id number]) ?Resp:  [17-22] 18 (03/02 0433) ?BP: (155-199)/(76-118) 175/76 (03/02 0433) ?SpO2:  [90 %-100 %] 93 % (03/02  [id number]) ?Weight:  [78.1 kg-79.7 kg] 78.1 kg (03/02 [id number]) ?Physical Exam: ?General: Pt lying in bed, appears uncomfortable, mild distress ?Cardiovascular: irregularly irregular ?Respiratory: very mild crackles throughout, mild expiratory wheezing throughout, increased WOB ?Abdomen: soft, non tender to palpation, non distended ?Extremities: 0.5+ pitting ededma of LLE, 1+ pitting edema of RLE. R thigh pain not worse with palpation or motion. No erythema of bilateral lower extremities.  ? ?Laboratory: ?Recent Labs  ?Lab 03/20/21 ?1340 03/21/21 ?[id number]  ?WBC 7.3 8.5  ?HGB 11.4* 12.1  ?HCT 39.5 40.1  ?PLT 218 196  ? ?Recent Labs  ?Lab 03/20/21 ?1340 03/21/21 ?[id number]  ?NA 141 140  ?K 4.2 4.7  ?CL 111 105  ?CO2 22 22  ?BUN 19 23  ?CREATININE 1.61* 1.85*  ?CALCIUM 9.6 9.6  ?GLUCOSE 105* 116*  ? ? ? ? ?Precious Gilding, DO ?03/21/2021, 7:03 AM ?PGY-1, [place] ?[place] Intern pager: [phone], text pages welcome ? ?

## 2021-03-21 NOTE — Progress Notes (Signed)
?  Transition of Care (TOC) Screening Note ? ? ?Patient Details  ?Name: [name] ?Date of Birth: [birth year] ? ? ?Transition of Care (TOC) CM/SW Contact:    ?Milas Gain, LCSWA ?Phone Number: ?03/21/2021, 3:13 PM ? ? ? ?Transition of Care Department [facility]) has reviewed patient and no TOC needs have been identified at this time. We will continue to monitor patient advancement through interdisciplinary progression rounds. If new patient transition needs arise, please place a TOC consult. ?  ?

## 2021-03-21 NOTE — Hospital Course (Addendum)
Patient is an 86 year old female presenting with dyspnea on exertion and chest pain.  PMH significant for HFpEF, s/p mechanical mitral valve on Coumadin, permanent A-fib, HTN, CKD stage III, osteoporosis, history of right hemicolectomy, cirrhosis, GERD  DOE and CP   acute on chronic CHF in setting of HFpEF   acute hypoxic respiratory failure Echocardiogram on 03/21/21 showed LVEF 35-40%, global hypokinesis, moderate LVH, mild RV systolic dysfunction, severe biatrial enlargement, mechanical MVR with normal function, mild to moderate AS.Cardiology consulted and recommended diuresis and RHC and LHC d/t drastic decrease in EF. However pt then developed ARF and urinary retention. After foley placed, pt self diuresed with improvement in respiratory status and breathing comfortably on RA. RHC and LHC completed on *** which showed ***  Urinary retention   ARF   UTI On 3/4, patient had Cr elevation to 5.37 with GFR 7 with likely bladder outlet obstruction. She had Foley placed with urinary output of 1.85 L. Pt was started on Flomax 0.4 mg daily.  Renal ultrasound s/p Foley catheter showed no hydronephrosis. Cr and GFR improved. Pt found to have UTI with dysuria, urine cx grew Proteus mirabilis and E. coli. She was treated with Keflex 500 mg BID for total of 5 days. After unsuccessful void trials, foley was replaced and urology consulted who recommended out patient follow up with [facility] for repeat void trials and urodynamics.  S/P mechanical mitral valve   A-Fib Pt was initially continued home medications of cardizem and metoprolol. Home warfarin was d/c and pt was placed on heparin gtt with plans for procedure. Due to persistent amiodarone gtt and metoprolol XL were initiated with successful rate control. Pt was discharged on ***   HTN Pt was continued on home medications of metoprolol 100mg  BID, cardizem 40 mg daily and started on hydralazine 25 mg TID. Medications were altered prn d/t soft pressures in  setting of a fib w/ RVR on amiodarone. She was discharged on ***.    PCP Follow-up items:  Follow-up outpatient with [facility] for repeat void trial and urodynamics.

## 2021-03-22 DIAGNOSIS — N1832 Chronic kidney disease, stage 3b: Secondary | ICD-10-CM

## 2021-03-22 DIAGNOSIS — I5023 Acute on chronic systolic (congestive) heart failure: Secondary | ICD-10-CM

## 2021-03-22 DIAGNOSIS — R410 Disorientation, unspecified: Secondary | ICD-10-CM

## 2021-03-22 DIAGNOSIS — I482 Chronic atrial fibrillation, unspecified: Secondary | ICD-10-CM | POA: Diagnosis not present

## 2021-03-22 DIAGNOSIS — J9601 Acute respiratory failure with hypoxia: Secondary | ICD-10-CM

## 2021-03-22 LAB — BASIC METABOLIC PANEL
Anion gap: 10 (ref 5–15)
BUN: 37 mg/dL — ABNORMAL HIGH (ref 8–23)
CO2: 24 mmol/L (ref 22–32)
Calcium: 9.1 mg/dL (ref 8.9–10.3)
Chloride: 105 mmol/L (ref 98–111)
Creatinine, Ser: 2.87 mg/dL — ABNORMAL HIGH (ref 0.44–1.00)
GFR, Estimated: 16 mL/min — ABNORMAL LOW (ref >60)
Glucose, Bld: 113 mg/dL — ABNORMAL HIGH (ref 70–99)
Potassium: 4.1 mmol/L (ref 3.5–5.1)
Sodium: 139 mmol/L (ref 135–145)

## 2021-03-22 LAB — CBC
HCT: 34.7 % — ABNORMAL LOW (ref 36.0–46.0)
Hemoglobin: 10.7 g/dL — ABNORMAL LOW (ref 12.0–15.0)
MCH: 27.4 pg (ref 26.0–34.0)
MCHC: 30.8 g/dL (ref 30.0–36.0)
MCV: 88.7 fL (ref 80.0–100.0)
Platelets: 145 10*3/uL — ABNORMAL LOW (ref 150–400)
RBC: 3.91 MIL/uL (ref 3.87–5.11)
RDW: 16 % — ABNORMAL HIGH (ref 11.5–15.5)
WBC: 10.1 10*3/uL (ref 4.0–10.5)
nRBC: 0 % (ref 0.0–0.2)

## 2021-03-22 LAB — PROTIME-INR
INR: 4.4 (ref 0.8–1.2)
Prothrombin Time: 42.1 seconds — ABNORMAL HIGH (ref 11.4–15.2)

## 2021-03-22 LAB — BRAIN NATRIURETIC PEPTIDE: B Natriuretic Peptide: 1374.9 pg/mL — ABNORMAL HIGH (ref 0.0–100.0)

## 2021-03-22 LAB — MAGNESIUM: Magnesium: 2.4 mg/dL (ref 1.7–2.4)

## 2021-03-22 MED ORDER — HYDRALAZINE HCL 25 MG PO TABS
25.0000 mg | ORAL_TABLET | Freq: Three times a day (TID) | ORAL | Status: AC
  Administered 2021-03-22 – 2021-03-23 (×3): 25 mg via ORAL
  Filled 2021-03-22 (×3): qty 1

## 2021-03-22 NOTE — Progress Notes (Addendum)
Progress Note  [name] Date of Encounter: 03/22/2021  [facility] Cardiologist: Cristopher Peru, MD   Subjective   No acute overnight events. Patient somnolent this morning. She states she was given medications to help her sleep last night and still feels "drugged." She denies any shortness of breath although she has mild increased work of breathing this morning. No chest pain or palpitations. She is asking when she can go home.  Inpatient Medications    Scheduled Meds:  dorzolamide-timolol  1 drop Both Eyes BID   hydrALAZINE  25 mg Oral Q8H   isosorbide mononitrate  30 mg Oral Daily   latanoprost  1 drop Both Eyes QHS   metoprolol tartrate  100 mg Oral BID   pantoprazole  40 mg Oral Daily   polyethylene glycol  17 g Oral Daily   senna  1 tablet Oral Daily   Continuous Infusions:  PRN Meds: acetaminophen, albuterol, diclofenac Sodium   Vital Signs    Vitals:   03/21/21 1944 03/21/21 2307 03/22/21 0607 03/22/21 1043  BP: (!) 172/91 (!) 161/99 (!) 142/91 (!) 154/97  Pulse: 88  97 (!) 106  Resp:      Temp:   97.8 F (36.6 C)   TempSrc:   Oral   SpO2: 100%  98% 93%  Weight:   78.5 kg   Height:        Intake/Output Summary (Last 24 hours) at 03/22/2021 1108 Last data filed at 03/22/2021 0600 Gross per 24 hour  Intake 745.03 ml  Output 1350 ml  Net -604.97 ml   Last 3 Weights 03/22/2021 03/21/2021 03/20/2021  Weight (lbs) 173 lb 1 oz 172 lb 3.2 oz 175 lb 11.2 oz  Weight (kg) 78.5 kg 78.109 kg 79.697 kg      Telemetry    Atrial fibrillation with some PVCs. Rates in the 80s to low 100s. - Personally Reviewed  ECG    No new ECG tracing today.  - Personally Reviewed  Physical Exam   GEN: No acute distress.  Neck: No JVD. Cardiac: Irregularly irregular rhythm with borderline tachycardia. Soft mechanical click. II/VI systolic murmur noted. No rubs or gallops. Respiratory: Mild increased work of breathing. Decreased breath sounds in bases  GI:  Possible mild distension but soft and non-tender to palpation. MS: Mild tight pitting edema. No deformity. Skin: Warm and dry. Neuro:  No focal deficits. Psych: Normal affect. Very fatigued/somnolent.  Labs    High Sensitivity Troponin:   Recent Labs  Lab 03/20/21 1340 03/20/21 1647  TROPONINIHS 14 14     Chemistry Recent Labs  Lab 03/20/21 1340 03/21/21 0247 03/22/21 0613  NA 141 140 139  K 4.2 4.7 4.1  CL 111 105 105  CO2 22 22 24   GLUCOSE 105* 116* 113*  BUN 19 23 37*  CREATININE 1.61* 1.85* 2.87*  CALCIUM 9.6 9.6 9.1  MG  --  1.8 2.4  GFRNONAA 32* 27* 16*  ANIONGAP 8 13 10     Lipids No results for input(s): CHOL, TRIG, HDL, LABVLDL, LDLCALC, CHOLHDL in the last 168 hours.  Hematology Recent Labs  Lab 03/20/21 1340 03/21/21 0247 03/22/21 0612  WBC 7.3 8.5 10.1  RBC 4.26 4.46 3.91  HGB 11.4* 12.1 10.7*  HCT 39.5 40.1 34.7*  MCV 92.7 89.9 88.7  MCH 26.8 27.1 27.4  MCHC 28.9* 30.2 30.8  RDW 16.0* 15.9* 16.0*  PLT 218 196 145*   Thyroid No results for input(s): TSH, FREET4 in the last  168 hours.  BNP Recent Labs  Lab 03/20/21 1340 03/22/21 0612  BNP 1,633.6* 1,374.9*    DDimer No results for input(s): DDIMER in the last 168 hours.   Radiology    DG Chest 2 View  Result Date: 03/21/2021 CLINICAL DATA:  Shortness of breath EXAM: CHEST - 2 VIEW COMPARISON:  03/20/2021 FINDINGS: Similar enlargement of the cardiomediastinal silhouette. Small bilateral pleural effusions with adjacent atelectasis. Persistent interstitial changes that may reflect edema. No pneumothorax. IMPRESSION: Stable cardiomegaly. Similar small pleural effusions with adjacent atelectasis and probable mild interstitial edema. Electronically Signed   By: Guadlupe Spanish M.D.   On: 03/21/2021 09:16   DG Chest 2 View  Result Date: 03/20/2021 CLINICAL DATA:  Shortness of breath EXAM: CHEST - 2 VIEW COMPARISON:  Previous studies including the examination of 07/25/2020 FINDINGS: Transverse  diameter of heart is increased. Central pulmonary vessels are more prominent. There is subtle increase in interstitial markings in the mid and lower lung fields, more so on the right side. There is blunting of both lateral CP angles. There is no pneumothorax. Metallic sutures seen in the sternum. IMPRESSION: Cardiomegaly. Central pulmonary vessels are more prominent suggesting mild CHF. Subtle increase in interstitial markings in the mid and lower lung fields may suggest mild interstitial pulmonary edema. Small bilateral pleural effusions. Electronically Signed   By: Ernie Avena M.D.   On: 03/20/2021 13:52   DG Abd 1 View  Result Date: 03/20/2021 CLINICAL DATA:  Abdominal pain EXAM: ABDOMEN - 1 VIEW COMPARISON:  02/07/2006 FINDINGS: Bowel gas pattern is nonspecific. Stomach is not distended. No abnormal masses are seen. Surgical clips are seen in the gallbladder fossa. Heart is enlarged in size. Degenerative changes are noted in the lumbar spine. IMPRESSION: Nonspecific bowel gas pattern. Electronically Signed   By: Ernie Avena M.D.   On: 03/20/2021 20:48   ECHOCARDIOGRAM COMPLETE  Result Date: 03/21/2021    ECHOCARDIOGRAM REPORT   Patient Name:   [name] Date of Exam: 03/21/2021 Medical Rec #:  [id number]      Height:       68.0 in Accession #:    [id number]     Weight:       172.2 lb Date of Birth:  03/24/1935      BSA:          1.918 m Patient Age:    86 years       BP:           175/76 mmHg Patient Gender: F              HR:           75 bpm. Exam Location:  Inpatient Procedure: 2D Echo, Cardiac Doppler and Color Doppler Indications:    I50.9 CHF  History:        Patient has prior history of Echocardiogram examinations, most                 recent 07/25/2020. Arrythmias:RBBB and Atrial Fibrillation; Risk                 Factors:Hypertension. DILATED CARDIOMYOPATHY / RHUEMATIC MV DX.                  Mitral Valve: St. Jude mechanical valve valve is present in the                 mitral  position. Procedure Date: 80.  Sonographer:    Festus Barren Referring Phys: [id number]  LAWSING  Sonographer Comments: Suboptimal subcostal window. IMPRESSIONS  1. Left ventricular ejection fraction, by estimation, is 35 to 40%. The left ventricle has moderately decreased function. The left ventricle demonstrates global hypokinesis. There is moderate left ventricular hypertrophy. Left ventricular diastolic function could not be evaluated.  2. Right ventricular systolic function is mildly reduced. The right ventricular size is normal.  3. Left atrial size was Massively dilated at 126 ml/m2.  4. Right atrial size was severely dilated.  5. The mitral valve has been repaired/replaced. Trivial mitral valve regurgitation. No evidence of mitral stenosis. There is a St. Jude mechanical valve present in the mitral position. Procedure Date: 49. Echo findings are consistent with normal structure and function of the mitral valve prosthesis.  6. The aortic valve is calcified. Aortic valve regurgitation is not visualized. Mild to moderate aortic valve stenosis. Aortic valve area, by VTI measures 1.48 cm. Aortic valve mean gradient measures 8.0 mmHg. Aortic valve Vmax measures 1.94 m/s. Comparison(s): Changes from prior study are noted. 07/25/2020: LVEF 50-55%. FINDINGS  Left Ventricle: Left ventricular ejection fraction, by estimation, is 35 to 40%. The left ventricle has moderately decreased function. The left ventricle demonstrates global hypokinesis. The left ventricular internal cavity size was normal in size. There is moderate left ventricular hypertrophy. Left ventricular diastolic function could not be evaluated due to mitral valve replacement. Left ventricular diastolic function could not be evaluated. Right Ventricle: The right ventricular size is normal. No increase in right ventricular wall thickness. Right ventricular systolic function is mildly reduced. Left Atrium: Left atrial size was Massively dilated at 126  ml/m2. Right Atrium: Right atrial size was severely dilated. Pericardium: There is no evidence of pericardial effusion. Mitral Valve: The mitral valve has been repaired/replaced. Trivial mitral valve regurgitation. There is a St. Jude mechanical valve present in the mitral position. Procedure Date: 40. Echo findings are consistent with normal structure and function of the mitral valve prosthesis. No evidence of mitral valve stenosis. Tricuspid Valve: The tricuspid valve is grossly normal. Tricuspid valve regurgitation is mild. Aortic Valve: The aortic valve is calcified. Aortic valve regurgitation is not visualized. Mild to moderate aortic stenosis is present. Aortic valve mean gradient measures 8.0 mmHg. Aortic valve peak gradient measures 15.1 mmHg. Aortic valve area, by VTI  measures 1.48 cm. Pulmonic Valve: The pulmonic valve was not well visualized. Pulmonic valve regurgitation is not visualized. Aorta: The aortic root and ascending aorta are structurally normal, with no evidence of dilitation. IAS/Shunts: No atrial level shunt detected by color flow Doppler.  LEFT VENTRICLE PLAX 2D LVIDd:         4.60 cm LVIDs:         3.90 cm LV PW:         1.70 cm LV IVS:        1.60 cm LVOT diam:     1.90 cm LV SV:         55 LV SV Index:   29 LVOT Area:     2.84 cm  LV Volumes (MOD) LV vol d, MOD A2C: 76.8 ml LV vol d, MOD A4C: 56.2 ml LV vol s, MOD A2C: 37.0 ml LV vol s, MOD A4C: 39.1 ml LV SV MOD A2C:     39.8 ml LV SV MOD A4C:     56.2 ml LV SV MOD BP:      28.9 ml RIGHT VENTRICLE RV S prime:     7.07 cm/s TAPSE (M-mode): 1.1 cm LEFT ATRIUM  Index         RIGHT ATRIUM           Index LA diam:        5.10 cm  2.66 cm/m    RA Area:     27.50 cm LA Vol (A2C):   288.0 ml 150.17 ml/m  RA Volume:   99.90 ml  52.09 ml/m LA Vol (A4C):   147.0 ml 76.65 ml/m LA Biplane Vol: 212.0 ml 110.54 ml/m  AORTIC VALVE                     PULMONIC VALVE AV Area (Vmax):    1.29 cm      PV Vmax:       0.52 m/s AV Area  (Vmean):   1.18 cm      PV Vmean:      33.600 cm/s AV Area (VTI):     1.48 cm      PV VTI:        0.092 m AV Vmax:           194.00 cm/s   PV Peak grad:  1.1 mmHg AV Vmean:          133.000 cm/s  PV Mean grad:  1.0 mmHg AV VTI:            0.374 m AV Peak Grad:      15.1 mmHg AV Mean Grad:      8.0 mmHg LVOT Vmax:         88.30 cm/s LVOT Vmean:        55.200 cm/s LVOT VTI:          0.195 m LVOT/AV VTI ratio: 0.52  AORTA Ao Root diam: 2.60 cm Ao Asc diam:  2.80 cm TRICUSPID VALVE TR Peak grad:   33.6 mmHg TR Mean grad:   21.0 mmHg TR Vmax:        290.00 cm/s TR Vmean:       224.0 cm/s  SHUNTS Systemic VTI:  0.20 m Systemic Diam: 1.90 cm Lyman Bishop MD Electronically signed by Lyman Bishop MD Signature Date/Time: 03/21/2021/3:11:41 PM    Final     Cardiac Studies   Echocardiogram 03/21/2021: Impressions: 1. Left ventricular ejection fraction, by estimation, is 35 to 40%. The  left ventricle has moderately decreased function. The left ventricle  demonstrates global hypokinesis. There is moderate left ventricular  hypertrophy. Left ventricular diastolic  function could not be evaluated.   2. Right ventricular systolic function is mildly reduced. The right  ventricular size is normal.   3. Left atrial size was Massively dilated at 126 ml/m2.   4. Right atrial size was severely dilated.   5. The mitral valve has been repaired/replaced. Trivial mitral valve  regurgitation. No evidence of mitral stenosis. There is a St. Jude  mechanical valve present in the mitral position. Procedure Date: 77.  Echo findings are consistent with normal  structure and function of the mitral valve prosthesis.   6. The aortic valve is calcified. Aortic valve regurgitation is not  visualized. Mild to moderate aortic valve stenosis. Aortic valve area, by  VTI measures 1.48 cm. Aortic valve mean gradient measures 8.0 mmHg.  Aortic valve Vmax measures 1.94 m/s.   Comparison(s): Changes from prior study are noted.  07/25/2020: LVEF 50-55%.  Patient Profile     86 y.o. female with a history of chronic diastolic CHF, chronic atrial fibrillation on Coumadin, St Jude mechanical MVR in 1987, mild  aortic stenosis, NSVT, RBBB, hypertension, CKD stage III, GERD, and chronic anemia who is being seen for evaluation of acute on chronic diastolic CHF at the request of Dr. Andria Frames.  Assessment & Plan    Acute on Chronic Combined CHF Patient presented with shortness of breath and orthopnea x3 months. BNP elevated at 1,633 >> 1,374. High-sensitivity troponin negative x2. Chest x-ray showed mild pulmonary edema and small bilateral pleural effusions. Echo showed LVEF of 35-40% with global hypokinesis and moderate LVH (EF dropped from 50-55% in 07/2020). She was started on IV Lasix. Documented output of 1.45 L yesterday but only net negative 795 mL. Creatinine rising and up from 1.85 to 2.87 today. - She has mild increased work of breathing on exam but denies feeling short of breath.  - Will hold IV Lasix given significant rise in creatinine. - No ACEi/ARB/ARNI or MRA given renal function. - Continue Lopressor 100mg  twice daily. Will ideally switch to Toprol-XL prior to discharge given reduced EF. - Continue Imdur 30mg  daily. - Primary team started Hydralazine 25mg  three times daily. Agree with this. - Continue to monitor daily weights, strict I/Os, and renal function.  - Will discuss possible ischemic evaluation with MD given drop in EF.  Permanent Atrial Fibrillation  Rate reasonably well controlled. Rates in the 80s to 90s at rest but easily increase to the low 100s with minimal activity such as moving in bed. - Currently on Cardizem CD 240mg  daily and Lopressor 100mg  twice daily. - Will stop Cardizem given drop in EF. Suspect we may need to increase Lopressor now that she will be off Cardizem but will wait to see how she does today. - Continue chronic anticoagulation with Coumadin. INR 4.4. Dosing per Pharmacy.  S/p  Mechanic Mitral Valve Replacement in 1987 Echo this admission showed normal structure and function of mitral valve prothesis with no evidence of mitral stenosis and only trivial mitral regurgitation. - On Coumadin as above.  Hypertension BP elevated. - Continue Lopressor 100mg  twice daily. - Continue Imdur 30mg  daily. - Agree with starting Hydralazine 25mg  three time daily. - Cardizem stopped due to drop in EF.  Acute on CKD Stage III Baseline creatinine around 1.5. Creatinine was 1.61 on admission and has been rising. 2.87 today. - Will hold diuresis as above. - Continue to monitor.  For questions or updates, please contact [place] Please consult [email or web address] for contact info under        Signed, Darreld Mclean, PA-C  03/22/2021, 11:08 AM    Personally seen and examined. Agree with above.  Fairly sleepy.  Mildly increased work of breathing.  Creatinine increased significantly, doubled almost to 3.  IV diuresis has stopped.  Clearly she has become volume contracted.  EF is decreased.  INR supratherapeutic.  Mechanical mitral valve.  Permanent atrial fibrillation.  Cardizem has been discontinued because of decreased EF.  Candee Furbish, MD

## 2021-03-22 NOTE — Progress Notes (Signed)
Pt.refused her V/S taken & saying she wanted to leave.Called her daughters & they tried to talk to her on phone but she refused.MD on call was called & made aware & she said she is coming to see her. ?

## 2021-03-22 NOTE — Progress Notes (Signed)
CRITICAL VALUE ALERT ? ?Critical value received:  INR=4.4 ? ?Date of notification:  03/22/21 ? ?Time of notification:  [phone] ? ?Critical value read back:Yes.   ? ?Nurse who received alert:  Kwabena Strutz RN ? ?MD notified (1st page):  Dr.Dameron ? ?Time of first page:  [phone] ? ?MD notified (2nd page): ? ?Time of second page: ? ?Responding MD:  Dr.Cameron ? ?Time MD responded:  [phone]  ?

## 2021-03-22 NOTE — Care Management Important Message (Signed)
Important Message ? ?Patient Details  ?Name: [name] ?MRN: [id number] ?Date of Birth: [birth year] ? ? ?Medicare Important Message Given:  Yes ? ? ? ? ?[name] ?03/22/2021, 10:59 AM ?

## 2021-03-23 ENCOUNTER — Inpatient Hospital Stay (HOSPITAL_COMMUNITY): Payer: Medicare Other

## 2021-03-23 DIAGNOSIS — I5023 Acute on chronic systolic (congestive) heart failure: Secondary | ICD-10-CM | POA: Diagnosis not present

## 2021-03-23 DIAGNOSIS — N179 Acute kidney failure, unspecified: Secondary | ICD-10-CM | POA: Diagnosis not present

## 2021-03-23 DIAGNOSIS — N189 Chronic kidney disease, unspecified: Secondary | ICD-10-CM

## 2021-03-23 DIAGNOSIS — I482 Chronic atrial fibrillation, unspecified: Secondary | ICD-10-CM | POA: Diagnosis not present

## 2021-03-23 DIAGNOSIS — J9601 Acute respiratory failure with hypoxia: Secondary | ICD-10-CM | POA: Diagnosis not present

## 2021-03-23 LAB — MAGNESIUM: Magnesium: 2.4 mg/dL (ref 1.7–2.4)

## 2021-03-23 LAB — PROTIME-INR
INR: 4.7 (ref 0.8–1.2)
Prothrombin Time: 44.1 seconds — ABNORMAL HIGH (ref 11.4–15.2)

## 2021-03-23 LAB — CBC
HCT: 38.2 % (ref 36.0–46.0)
Hemoglobin: 11.8 g/dL — ABNORMAL LOW (ref 12.0–15.0)
MCH: 27.1 pg (ref 26.0–34.0)
MCHC: 30.9 g/dL (ref 30.0–36.0)
MCV: 87.8 fL (ref 80.0–100.0)
Platelets: 202 10*3/uL (ref 150–400)
RBC: 4.35 MIL/uL (ref 3.87–5.11)
RDW: 16 % — ABNORMAL HIGH (ref 11.5–15.5)
WBC: 8.6 10*3/uL (ref 4.0–10.5)
nRBC: 0 % (ref 0.0–0.2)

## 2021-03-23 LAB — BASIC METABOLIC PANEL
Anion gap: 13 (ref 5–15)
BUN: 59 mg/dL — ABNORMAL HIGH (ref 8–23)
CO2: 22 mmol/L (ref 22–32)
Calcium: 9.3 mg/dL (ref 8.9–10.3)
Chloride: 104 mmol/L (ref 98–111)
Creatinine, Ser: 5.37 mg/dL — ABNORMAL HIGH (ref 0.44–1.00)
GFR, Estimated: 7 mL/min — ABNORMAL LOW (ref >60)
Glucose, Bld: 114 mg/dL — ABNORMAL HIGH (ref 70–99)
Potassium: 4.1 mmol/L (ref 3.5–5.1)
Sodium: 139 mmol/L (ref 135–145)

## 2021-03-23 LAB — CK: Total CK: 50 U/L (ref 38–234)

## 2021-03-23 MED ORDER — PSYLLIUM 95 % PO PACK
1.0000 | PACK | Freq: Every day | ORAL | Status: DC
  Administered 2021-03-23 – 2021-03-25 (×3): 1 via ORAL
  Filled 2021-03-23 (×7): qty 1

## 2021-03-23 MED ORDER — CHLORHEXIDINE GLUCONATE CLOTH 2 % EX PADS
6.0000 | MEDICATED_PAD | Freq: Every day | CUTANEOUS | Status: DC
  Administered 2021-03-23 – 2021-03-25 (×5): 6 via TOPICAL

## 2021-03-23 NOTE — Evaluation (Signed)
Clinical/Bedside Swallow Evaluation ?Patient Details  ?Name: [name] ?MRN: [id number] ?Date of Birth: [birth year] ? ?Today's Date: 03/23/2021 ?Time: SLP Start Time (ACUTE ONLY): 0825 SLP Stop Time (ACUTE ONLY): [id number] ?SLP Time Calculation (min) (ACUTE ONLY): 10 min ? ?Past Medical History:  ?Past Medical History:  ?Diagnosis Date  ? Asthma   ? years ago  ? Chronic atrial fibrillation (HCC)   ? Dilated cardiomyopathy ([place])   ? history of this, now resolved  ? Dysphagia   ? Hx  ? Fracture of tibial plateau 05/21/2015  ? GERD (gastroesophageal reflux disease)   ? GI bleed   ? Hemorrhoid 04/2014  ? bleeding  ? History of blood transfusion   ? Hypertension   ? Microcytic anemia   ? Nonsustained ventricular tachycardia   ? Osteoporosis   ? Protein in urine   ? RBBB (right bundle branch block)   ? Rheumatic mitral valve disease   ? ?Past Surgical History:  ?Past Surgical History:  ?Procedure Laterality Date  ? CHOLECYSTECTOMY  2005  ? COLONOSCOPY    ? COLONOSCOPY W/ POLYPECTOMY    ? EYE SURGERY  04/2014  ? HEMICOLECTOMY  2008  ? forpolyps  ? MITOMYCIN C APPLICATION Right [id number]  ? Procedure: MITOMYCIN C APPLICATION;  Surgeon: Marylynn Pearson, MD;  Location: [facility];  Service: Ophthalmology;  Laterality: Right;  ? MITRAL VALVE REPLACEMENT  1987  ? St Jude mechanical valve  ? TRABECULECTOMY Right 05/17/2014  ? Procedure: TRABECULECTOMY WITH MITOMYCIN RIGHT EYE;  Surgeon: Marylynn Pearson, MD;  Location: [facility];  Service: Ophthalmology;  Laterality: Right;  ? TUBAL LIGATION    ? US ECHOCARDIOGRAPHY  02/2008, 03/2006, 06/2004  ? ?HPI:  ?86 year old female presenting with dyspnea on exertion and chest pain.  PMH significant for HFpEF, s/p mechanical mitral valve on Coumadin, permanent A-fib, HTN, CKD stage III, osteoporosis, history of right hemicolectomy, cirrhosis, GERD. Per RN, pt with episodic coughing with PO prompting clinical swallow evaluation.  ?  ?Assessment / Plan / Recommendation  ?Clinical Impression ? Pt appears to have  grossly functional oropharyngeal swallow during clinical interaction this date. Per RN, pt did exhibit some previous episodic coughing this admission prompting clinical swallow evaluation. Vocal quality remained clear and no overt s/sx of aspiration were noted with any POs. Pt does have hx of GERD and exhibited belch x1. Will follow up x1 to ensure diet tolerance in setting of episodic coughing hx. Recommend regular diet and thin liquids with standard aspiration precautions. ?SLP Visit Diagnosis: Dysphagia, unspecified (R13.10) ?   ?Aspiration Risk ? Mild aspiration risk  ?  ?Diet Recommendation   Regular, thin liquids  ? ?Medication Administration: Whole meds with liquid  ?  ?Other  Recommendations Oral Care Recommendations: Oral care BID   ? ?Recommendations for follow up therapy are one component of a multi-disciplinary discharge planning process, led by the attending physician.  Recommendations may be updated based on patient status, additional functional criteria and insurance authorization. ? ?Follow up Recommendations No SLP follow up  ? ? ?  ?Assistance Recommended at Discharge    ?Functional Status Assessment Patient has had a recent decline in their functional status and demonstrates the ability to make significant improvements in function in a reasonable and predictable amount of time.  ?Frequency and Duration min 1 x/week  ?1 week ?  ?   ? ?Prognosis Prognosis for Safe Diet Advancement: Good ?Barriers to Reach Goals: Cognitive deficits;Time post onset  ? ?  ? ?Swallow Study   ?  General Date of Onset: 03/20/21 ?HPI: 86 year old female presenting with dyspnea on exertion and chest pain.  PMH significant for HFpEF, s/p mechanical mitral valve on Coumadin, permanent A-fib, HTN, CKD stage III, osteoporosis, history of right hemicolectomy, cirrhosis, GERD. Per RN, pt with episodic coughing with PO prompting clinical swallow evaluation. ?Type of Study: Bedside Swallow Evaluation ?Previous Swallow Assessment:  none on file ?Diet Prior to this Study: NPO ?Temperature Spikes Noted: No ?Respiratory Status: Nasal cannula ?History of Recent Intubation: No ?Behavior/Cognition: Alert;Requires cueing ?Oral Cavity Assessment: Dry ?Oral Care Completed by SLP: Yes ?Oral Cavity - Dentition: Missing dentition ?Vision: Functional for self-feeding ?Self-Feeding Abilities: Needs set up ?Patient Positioning: Upright in bed ?Baseline Vocal Quality: Normal ?Volitional Swallow: Able to elicit  ?  ?Oral/Motor/Sensory Function Overall Oral Motor/Sensory Function: Within functional limits   ?Ice Chips Ice chips: Within functional limits ?Presentation: Spoon   ?Thin Liquid Thin Liquid: Within functional limits ?Presentation: Straw;Cup  ?  ?Nectar Thick Nectar Thick Liquid: Not tested   ?Honey Thick Honey Thick Liquid: Not tested   ?Puree Puree: Within functional limits   ?Solid ? ? ?  Solid: Within functional limits ?Presentation: Self Fed  ? ?  ? ?[place], CCC-SLP ?Acute Rehabilitation Services  ? ?03/23/2021,8:47 AM ? ? ? ?

## 2021-03-23 NOTE — Progress Notes (Signed)
Family Medicine Teaching Service ?Daily Progress Note ?Intern Pager: [phone] ? ?[name] Medical record number: [id number] ?Date of birth: Sep 27, 1935 Age: 86 y.o. Gender: female ? ?Primary Care Provider: Dickie La, MD ?Consultants: Cardiology ?Code Status: Full ? ?Pt Overview and Major Events to Date:  ?03/20/2021-admitted ? ?Assessment and Plan: ?Patient is an 86 year old female presenting with dyspnea on exertion and chest pain.  PMH significant for HFpEF (now HFrEF), s/p mechanical mitral valve on Coumadin, permanent A-fib, HTN, CKD stage III, osteoporosis, history of right hemicolectomy, GERD ? ?Acute hypoxic respiratory failure secondary to new HFrEF ?Patient on 1 L oxygen via nasal cannula this morning with oxygen saturation of 93%.  Echocardiogram on 3/2 showed ejection fraction of 35 to 40%, global hypokinesis, moderate left ventricular hypertrophy, left atrium found to be massively dilated and right atrium severely dilated.  Patient did not receive Lasix yesterday due to creatinine jump from 1.85> 2.87 yesterday.  Today, Cr has increased again to 5.37 and GFR down to 7. Cardiology's note from today states patient may need left and right heart cath after renal function improves and also plan for adding Jardiance and [place] after renal function improves. ?-Consulted, appreciate recs ?-Continue to hold Lasix due to jump in Cr ?-Continue home Imdur 30 mg daily ?-Continue home metoprolol 100 mg twice daily ?-Strict I's and O's ?-Daily weights ?-AM BMP ? ?Acute renal failure on CKD stage III ?Pt now with continued worsening kidney function although lasix were held yesterday. Creatinine 2.87>5.37.  Baseline around 1.5. GFR 16>7. Will check CK level per nephrology to r/o rhabdomyolysis and order renal US. ?-consult nephrology ?-renal US ?-CK level  ?-CTM ?-avoid nephrotoxic medications ? ? ?Hypertension ?BPs were elevated after admission so hydralazine 25 mg 3 times daily was added to patient's  home regimen of metoprolol tartrate 100 mg twice daily and Cardizem 40 mg daily.  Cardizem was/seed due to new diagnosis of HFrEF yesterday.  BP this morning is 156/99. ?-Continue metoprolol 100 mg twice daily ?-Continue hydralazine 25 mg 3 times daily ? ?S/p mechanical mitral valve  persistent A-fib ?Home medications include Coumadin 5 mg on Sunday/Tuesday/Thursday, 10 mg on other days, Cardizem 40 mg daily, metoprolol 50 mg twice daily.  Plan was initially to D/C warfarin and switch to Lovenox when appropriate per INR.  Yesterday received message from pharmacy suggesting to instead switch to heparin vs Lovenox when appropriate due to worsening kidney function. ?-Heparin per pharmacy ?-Continue home metoprolol ?-D/C Cardizem due to new diagnosis of HFrEF ?-Continuous cardiac monitoring ? ? ?Delirium ?Overnight on 0/3/2-03/3 patient became delirious refusing nighttime medications and per daughter patient has a history of sundowning.  Patient received Seroquel 25 mg once at night but did not need Seroquel last night as there was no episode of delirium overnight. ?-Delirium precautions ? ?Constipation ?-Continue MiraLAX and senna daily ? ?GERD ?-Continue home pantoprazole 40 mg daily ? ?Prolonged QTc ?QTc slightly elevated at 484 ?-Avoid QTc prolonging medications ? ?FEN/GI: Regular diet ?PPx: Heparin when appropriate, INR today of 4.7 ?Dispo: Pending continued medical management ? ?Subjective:  ?Patient states she is breathing comfortably this morning.  She expresses desire to go home but also understands why she is here.  She denies any pain this morning. ? ?Objective: ?Temp:  [96.8 ?F (36 ?C)-99.6 ?F (37.6 ?C)] 99.6 ?F (37.6 ?C) (03/04 [id number]) ?Pulse Rate:  [91-106] 98 (03/03 2105) ?Resp:  [16] 16 (03/03 2105) ?BP: (139-172)/(90-118) 156/99 (03/04 0537) ?SpO2:  [93 %-94 %] 94 % (  03/04 0537) ?Physical Exam: ?General: Patient lying in bed, appears fatigued, NAD ?Neck: No JVD ?Cardiovascular: Irregularly  irregular ?Respiratory: Good air movement, no wheezing, no crackles ?Abdomen: Soft, nontender to palpation, nondistended ?Extremities: No edema BLEs ? ?Laboratory: ?Recent Labs  ?Lab 03/20/21 ?1340 03/21/21 ?[id number] 03/22/21 ?0612  ?WBC 7.3 8.5 10.1  ?HGB 11.4* 12.1 10.7*  ?HCT 39.5 40.1 34.7*  ?PLT 218 196 145*  ? ?Recent Labs  ?Lab 03/20/21 ?1340 03/21/21 ?[id number] 03/22/21 ?[id number]  ?NA 141 140 139  ?K 4.2 4.7 4.1  ?CL 111 105 105  ?CO2 22 22 24   ?BUN 19 23 37*  ?CREATININE 1.61* 1.85* 2.87*  ?CALCIUM 9.6 9.6 9.1  ?GLUCOSE 105* 116* 113*  ? ? ? ? ?Precious Gilding, DO ?03/23/2021, 7:45 AM ?PGY-1, [place] ?[place] Intern pager: [phone], text pages welcome ? ?

## 2021-03-23 NOTE — Progress Notes (Signed)
Went to see patient and started night shift.  Patient was resting comfortably and I did awaken her briefly to see how she was feeling based off concerns from earlier today with her overall health status.  Patient states that she feels a bit better than she did earlier today which could be partly due to the Foley catheter being placed.  She had no complaints at the time of my interaction.  She was in no distress. ? ?BP (!) 166/87   Pulse 92   Temp (!) 97.5 ?F (36.4 ?C) (Oral)   Resp 16   Ht 5\' 8"  (1.727 m)   Wt 78.5 kg   SpO2 94%   BMI 26.31 kg/m?  ?General: Frail-appearing female, no acute distress, resting in bed ?Cardiovascular: Irregularly irregular rhythm ?Respiratory: Appears clear bilaterally ?Neuro: No focal deficits ? ?Patient's creatinine is continued to increase and nephro was brought on board earlier today.  Foley catheter was placed with significant amount of urine output relieved.  We will continue to monitor her vitals and labs.  We do have cardiology and nephrology on board.  We will keep a close watch on her overnight. ?

## 2021-03-23 NOTE — Progress Notes (Signed)
Pt INR is 4.7. Intern notified.  ?

## 2021-03-23 NOTE — Plan of Care (Signed)
  Problem: Education: Goal: Ability to demonstrate management of disease process will improve Outcome: Progressing   Problem: Education: Goal: Ability to verbalize understanding of medication therapies will improve Outcome: Progressing   Problem: Activity: Goal: Capacity to carry out activities will improve Outcome: Progressing   Problem: Cardiac: Goal: Ability to achieve and maintain adequate cardiopulmonary perfusion will improve Outcome: Progressing   

## 2021-03-23 NOTE — Progress Notes (Signed)
Date and time results received: 03/23/21 1120 ?(use smartphrase ".now" to insert current time) ? ?Test: BMET ?Critical Value: BUN 59  Creatinine 5.37 ? ?Name of Provider Notified: Family medicine team ? ?Orders Received? Or Actions Taken?:  nephrology consult, CK, and US renal. ?

## 2021-03-23 NOTE — Consult Note (Signed)
[name] ?Admit Date: 03/20/2021 ?03/23/2021 ?Rexene Agent ?Requesting Physician:  Andria Frames MD ? ?Reason for Consult:  AKI  ?HPI:  ?37F admitted 03/20/2021 after presenting with progressive exertional dyspnea and chest discomfort.  PMH includes history of HFpEF, mechanical mitral valve replacement in 1987 on long-term warfarin, A-fib, hypertension, known CKD 3, osteoporosis, history of right hemicolectomy, GERD. ? ?Patient was admitted, placed on parenteral furosemide, cardiology was consulted.  TTE revealed new reduction in LVEF, consistent with mixed systolic and diastolic heart failure exacerbation. ? ?Presenting creatinine was 1.6, yesterday it increased to 2.9, today it is 5.4.  Potassium is 4.1.  Only 1 void documented yesterday, not quantified.  Patient has no major complaints.  She has had no nephrotoxin exposures.  She was not using NSAIDs.  No contrast exposure.  Furosemide has been held.  Urine analysis at presentation with 21-50 erythrocytes per high-powered field without pyuria.  1+ proteinuria present. ? ?On exam I can palpate significant suprapubic fullness. ? ? ?Creat  ?Date Value  ?10/24/2015 1.26 mg/dL (H)  ?07/18/2015 1.19 mg/dL (H)  ?05/30/2015 1.61 (A)  ?04/30/2015 1.44 mg/dL (H)  ? ?Creatinine, Ser (mg/dL)  ?Date Value  ?03/23/2021 5.37 (H)  ?03/22/2021 2.87 (H)  ?03/21/2021 1.85 (H)  ?03/20/2021 1.61 (H)  ?08/29/2020 1.48 (H)  ?07/26/2020 1.49 (H)  ?07/25/2020 1.53 (H)  ?05/02/2020 1.49 (H)  ?08/16/2019 1.37 (H)  ?07/20/2019 1.41 (H)  ?] ?I/Os: ?I/O last 3 completed shifts: ?In: 340 [P.O.:340] ?Out: 1050 [Urine:1050]  ? ?ROS ?NSAIDS: No exposure ?IV Contrast no exposure ?TMP/SMX no exposure ?Hypotension not present ?Balance of 12 systems is negative w/ exceptions as above ? ?PMH  ?Past Medical History:  ?Diagnosis Date  ? Asthma   ? years ago  ? Chronic atrial fibrillation (HCC)   ? Dilated cardiomyopathy ([place])   ? history of this, now resolved  ? Dysphagia   ? Hx  ? Fracture of tibial plateau  05/21/2015  ? GERD (gastroesophageal reflux disease)   ? GI bleed   ? Hemorrhoid 04/2014  ? bleeding  ? History of blood transfusion   ? Hypertension   ? Microcytic anemia   ? Nonsustained ventricular tachycardia   ? Osteoporosis   ? Protein in urine   ? RBBB (right bundle branch block)   ? Rheumatic mitral valve disease   ? ?PSH  ?Past Surgical History:  ?Procedure Laterality Date  ? CHOLECYSTECTOMY  2005  ? COLONOSCOPY    ? COLONOSCOPY W/ POLYPECTOMY    ? EYE SURGERY  04/2014  ? HEMICOLECTOMY  2008  ? forpolyps  ? MITOMYCIN C APPLICATION Right [id number]  ? Procedure: MITOMYCIN C APPLICATION;  Surgeon: Marylynn Pearson, MD;  Location: [facility];  Service: Ophthalmology;  Laterality: Right;  ? MITRAL VALVE REPLACEMENT  1987  ? St Jude mechanical valve  ? TRABECULECTOMY Right 05/17/2014  ? Procedure: TRABECULECTOMY WITH MITOMYCIN RIGHT EYE;  Surgeon: Marylynn Pearson, MD;  Location: [facility];  Service: Ophthalmology;  Laterality: Right;  ? TUBAL LIGATION    ? US ECHOCARDIOGRAPHY  02/2008, 03/2006, 06/2004  ? ?FH  ?Family History  ?Problem Relation Age of Onset  ? Cancer Father   ?     prostate cancer  ? Hypertension Mother   ? Stroke Mother   ?     CVA x 2  ? Cancer Sister   ?     in the Bladder  ? Endometriosis Daughter   ? Stroke Sister   ? Blindness Sister   ?  Hypertension Sister   ? Thyroid disease Daughter   ? Hypertension Daughter   ? Hypertension Daughter   ? Hypertension Daughter   ? Liver disease Daughter   ? Colon cancer Neg Hx   ? Breast cancer Neg Hx   ? ?SH  reports that she quit smoking about 36 years ago. Her smoking use included cigarettes. She has a 22.50 pack-year smoking history. She has never used smokeless tobacco. She reports that she does not drink alcohol and does not use drugs. ?Allergies  ?Allergies  ?Allergen Reactions  ? Esomeprazole Magnesium   ?  REACTION: stomach upset, nausea  ? ?Home medications ?Prior to Admission medications   ?Medication Sig Start Date End Date Taking? Authorizing Provider   ?Cholecalciferol (VITAMIN D) 2000 units tablet Take 1 tablet (2,000 Units total) by mouth daily. 06/10/17  Yes Nestor Ramp, MD  ?diltiazem (CARDIZEM CD) 240 MG 24 hr capsule Take 1 capsule (240 mg total) by mouth daily. 10/11/20  Yes Marinus Maw, MD  ?dorzolamidel-timolol (COSOPT) 22.3-6.8 MG/ML SOLN ophthalmic solution Place 1 drop into both eyes in the morning and at bedtime. 03/02/17  Yes [provider]  ?isosorbide mononitrate (IMDUR) 30 MG 24 hr tablet TAKE 1 TABLET(30 MG) BY MOUTH DAILY ?Patient taking differently: Take 30 mg by mouth daily. 01/22/21  Yes Marinus Maw, MD  ?LUMIGAN 0.01 % SOLN Place 1 drop into both eyes at bedtime. 01/26/17  Yes [provider]  ?metoprolol tartrate (LOPRESSOR) 100 MG tablet Take 1 tablet (100 mg total) by mouth 2 (two) times daily. 08/29/20 05/25/21 Yes Marinus Maw, MD  ?pantoprazole (PROTONIX) 40 MG tablet TAKE 1 TABLET(40 MG) BY MOUTH DAILY ?Patient taking differently: Take 40 mg by mouth daily. 10/12/20  Yes Nestor Ramp, MD  ?polyethylene glycol (MIRALAX / GLYCOLAX) 17 g packet Take 17 g by mouth daily as needed for mild constipation. 07/27/20  Yes Azalee Course, PA  ?potassium chloride (KLOR-CON) 10 MEQ tablet Take 2 tablets (20 mEq total) by mouth daily. ?Patient taking differently: Take 20 mEq by mouth 2 (two) times daily. 10/15/20 05/25/21 Yes Marinus Maw, MD  ?warfarin (COUMADIN) 10 MG tablet Take 1/2 to 1 tablet by mouth once daily as directed by anticoagulation clinic ?Patient taking differently: Take 5-10 mg by mouth See admin instructions. 5mg  once daily on Sunday's, Tuesday's, and Thursday's, and 10mg  once daily on all other days of the week 12/24/20  Yes Marinus Maw, MD  ? ? ?Current Medications ?Scheduled Meds: ? dorzolamide-timolol  1 drop Both Eyes BID  ? isosorbide mononitrate  30 mg Oral Daily  ? latanoprost  1 drop Both Eyes QHS  ? metoprolol tartrate  100 mg Oral BID  ? pantoprazole  40 mg Oral Daily  ? polyethylene glycol  17 g Oral  Daily  ? senna  1 tablet Oral Daily  ? ?Continuous Infusions: ?PRN Meds:.acetaminophen, albuterol, diclofenac Sodium ? ?CBC ?Recent Labs  ?Lab 03/20/21 ?1340 03/21/21 ?9485 03/22/21 ?4627 03/23/21 ?0350  ?WBC 7.3 8.5 10.1 8.6  ?NEUTROABS 5.0  --   --   --   ?HGB 11.4* 12.1 10.7* 11.8*  ?HCT 39.5 40.1 34.7* 38.2  ?MCV 92.7 89.9 88.7 87.8  ?PLT 218 196 145* 202  ? ?Basic Metabolic Panel ?Recent Labs  ?Lab 03/20/21 ?1340 03/21/21 ?0938 03/22/21 ?1829 03/23/21 ?0746  ?NA 141 140 139 139  ?K 4.2 4.7 4.1 4.1  ?CL 111 105 105 104  ?CO2 22 22 24 22   ?GLUCOSE  105* 116* 113* 114*  ?BUN 19 23 37* 59*  ?CREATININE 1.61* 1.85* 2.87* 5.37*  ?CALCIUM 9.6 9.6 9.1 9.3  ? ? ?Physical Exam   ?Blood pressure (!) 166/87, pulse 92, temperature 99.6 ?F (37.6 ?C), temperature source Axillary, resp. rate 16, height 5\' 8"  (1.727 m), weight 78.5 kg, SpO2 94 %. ?GEN: Elderly female, NAD ?ENT: NCAT ?EYES: EOMI ?CV: IRIR normal S1 and S2 ?PULM: Scattered wheezing, normal work of breathing ?ABD: Soft but significant suprapubic fullness and discrete mass noted in midline suggestive of bladder ?SKIN: No rashes or lesions ?EXT: No significant lower extremity edema ? ?Assessment ?10F AoCKD3 after presenting clinically wity AoC CHF exacerbation ? ?AoCKD3: degree and rapidity of renal failure is unusaul and worrisome, not typical of changes associated with diuresis.  I worry about B00 with a seemingly palpable bladder and this would be consistent with the change in kidney function.  . Place foley. Needs Renal US.  Check CK to see if generating more creatinine as well.  No nephrotoxin exposures noted.  Has hematuria on UA which isn't new but maybe worse? ON warfarin as well.  Rpt UA and UP/C ?AoC s/d CHF exacerbation, 1L [place], DIuretics on hold ?S/p mech MVR 1987, on long term warfarin ?AFIb ?HTN ?Hx/o R hemicolectomy  ? ?Plan ?Place foley ?Renal US now ?CHeck CK ?Cont to hold diuretics ?UP/C, repeat UA ?Daily weights, Daily Renal Panel, Strict I/Os,  Avoid nephrotoxins (NSAIDs, judicious IV Contrast)  ? ?Rexene Agent  ?03/23/2021, 12:26 PM ? ? ?  ?

## 2021-03-23 NOTE — Progress Notes (Addendum)
ANTICOAGULATION CONSULT NOTE - Initial Consult ? ?Pharmacy Consult for Warfarin >> Heparin ?Indication:  Mechanical Mitral Valve ? ?Allergies  ?Allergen Reactions  ? Esomeprazole Magnesium   ?  REACTION: stomach upset, nausea  ? ? ?Patient Measurements: ?Height: 5\' 8"  (172.7 cm) ?Weight: 78.5 kg (173 lb 1 oz) ?IBW/kg (Calculated) : 63.9 ? ?Vital Signs: ?Temp: 99.6 ?F (37.6 ?C) (03/04 [id number]) ?Temp Source: Axillary (03/04 0537) ?BP: 156/99 (03/04 0537) ?Pulse Rate: 98 (03/03 2105) ? ?Labs: ?Recent Labs  ?  03/20/21 ?1340 03/20/21 ?1647 03/20/21 ?1647 03/21/21 ?[id number] 03/22/21 ?[id number] 03/22/21 ?[id number] 03/23/21 ?0315  ?HGB 11.4*  --   --  12.1 10.7*  --   --   ?HCT 39.5  --   --  40.1 34.7*  --   --   ?PLT 218  --   --  196 145*  --   --   ?LABPROT  --  30.8*   < > 30.6* 42.1*  --  44.1*  ?INR  --  3.0*   < > 2.9* 4.4*  --  4.7*  ?CREATININE 1.61*  --   --  1.85*  --  2.87*  --   ?TROPONINIHS 14 14  --   --   --   --   --   ? < > = values in this interval not displayed.  ? ? ?Estimated Creatinine Clearance: 16.3 mL/min (A) (by C-G formula based on SCr of 2.87 mg/dL (H)). ? ? ?Medical History: ?Past Medical History:  ?Diagnosis Date  ? Asthma   ? years ago  ? Chronic atrial fibrillation (HCC)   ? Dilated cardiomyopathy ([place])   ? history of this, now resolved  ? Dysphagia   ? Hx  ? Fracture of tibial plateau 05/21/2015  ? GERD (gastroesophageal reflux disease)   ? GI bleed   ? Hemorrhoid 04/2014  ? bleeding  ? History of blood transfusion   ? Hypertension   ? Microcytic anemia   ? Nonsustained ventricular tachycardia   ? Osteoporosis   ? Protein in urine   ? RBBB (right bundle branch block)   ? Rheumatic mitral valve disease   ? ? ?Medications:  ?Scheduled:  ? dorzolamide-timolol  1 drop Both Eyes BID  ? isosorbide mononitrate  30 mg Oral Daily  ? latanoprost  1 drop Both Eyes QHS  ? metoprolol tartrate  100 mg Oral BID  ? pantoprazole  40 mg Oral Daily  ? polyethylene glycol  17 g Oral Daily  ? senna  1 tablet Oral Daily   ? ? ?Assessment: ?86 yo F with a history of mechanical mitral valve replacement, on long-term anticoagulation with warfarin.  PTA warfarin dosing: 5 mg Sun/Tues/Thurs, 10 mg all other days.  Last warfarin dose given on 3/1 @ 2051.  Pharmacy consulted for heparin dosing.  ? ?Today INR is high at 4.7. CBC stable. No signs/symptoms of bleeding noted per RN.  ? ?Goal of Therapy:  ?INR 2.5-3.5 ?Monitor platelets by anticoagulation protocol: Yes ?  ?Plan:  ?Hold warfarin for possible procedure.  ?Transition to heparin when INR < 2.5.  ?Daily CBC, INR.  ?Monitor for signs/symptoms of bleeding.  ? ? ?Vance Peper, PharmD ?PGY1 Pharmacy Resident ?Phone [phone] ?03/23/2021 6:58 AM  ? ?Please check AMION for all [place] [place] phone numbers ?After 10:00 PM, call [place] [phone] ? ? ? ?

## 2021-03-23 NOTE — Progress Notes (Addendum)
Progress Note  [name] Date of Encounter: 03/23/2021  [facility] Cardiologist: Cristopher Peru, MD   Subjective   Denies any CP. SOB slightly better.   Inpatient Medications    Scheduled Meds:  dorzolamide-timolol  1 drop Both Eyes BID   isosorbide mononitrate  30 mg Oral Daily   latanoprost  1 drop Both Eyes QHS   metoprolol tartrate  100 mg Oral BID   pantoprazole  40 mg Oral Daily   polyethylene glycol  17 g Oral Daily   senna  1 tablet Oral Daily   Continuous Infusions:  PRN Meds: acetaminophen, albuterol, diclofenac Sodium   Vital Signs    Vitals:   03/22/21 1421 03/22/21 2105 03/22/21 2200 03/23/21 0537  BP: 139/90 (!) 172/118 (!) 158/94 (!) 156/99  Pulse:  98    Resp:  16    Temp:  (!) 96.8 F (36 C)  99.6 F (37.6 C)  TempSrc:  Axillary  Axillary  SpO2:  94%  94%  Weight:      Height:        Intake/Output Summary (Last 24 hours) at 03/23/2021 0844 Last data filed at 03/22/2021 0930 Gross per 24 hour  Intake 240 ml  Output --  Net 240 ml   Last 3 Weights 03/22/2021 03/21/2021 03/20/2021  Weight (lbs) 173 lb 1 oz 172 lb 3.2 oz 175 lb 11.2 oz  Weight (kg) 78.5 kg 78.109 kg 79.697 kg      Telemetry    Atrial fibrillation with bundle branch block, HR 90s - Personally Reviewed  ECG    Atrial fibrillation with RBBB - Personally Reviewed  Physical Exam   GEN: No acute distress.   Neck: No JVD Cardiac: irregularly irregular, no murmurs, rubs, or gallops.  Respiratory: diminished breath sound in the R base of lung.  GI: Soft, nontender, non-distended  MS: No edema; No deformity. Neuro:  Nonfocal  Psych: Normal affect   Labs    High Sensitivity Troponin:   Recent Labs  Lab 03/20/21 1340 03/20/21 1647  TROPONINIHS 14 14     Chemistry Recent Labs  Lab 03/20/21 1340 03/21/21 0247 03/22/21 0613  NA 141 140 139  K 4.2 4.7 4.1  CL 111 105 105  CO2 22 22 24   GLUCOSE 105* 116* 113*  BUN 19 23 37*  CREATININE 1.61* 1.85*  2.87*  CALCIUM 9.6 9.6 9.1  MG  --  1.8 2.4  GFRNONAA 32* 27* 16*  ANIONGAP 8 13 10     Lipids No results for input(s): CHOL, TRIG, HDL, LABVLDL, LDLCALC, CHOLHDL in the last 168 hours.  Hematology Recent Labs  Lab 03/21/21 0247 03/22/21 0612 03/23/21 0746  WBC 8.5 10.1 8.6  RBC 4.46 3.91 4.35  HGB 12.1 10.7* 11.8*  HCT 40.1 34.7* 38.2  MCV 89.9 88.7 87.8  MCH 27.1 27.4 27.1  MCHC 30.2 30.8 30.9  RDW 15.9* 16.0* 16.0*  PLT 196 145* 202   Thyroid No results for input(s): TSH, FREET4 in the last 168 hours.  BNP Recent Labs  Lab 03/20/21 1340 03/22/21 0612  BNP 1,633.6* 1,374.9*    DDimer No results for input(s): DDIMER in the last 168 hours.   Radiology    DG Chest 2 View  Result Date: 03/21/2021 CLINICAL DATA:  Shortness of breath EXAM: CHEST - 2 VIEW COMPARISON:  03/20/2021 FINDINGS: Similar enlargement of the cardiomediastinal silhouette. Small bilateral pleural effusions with adjacent atelectasis. Persistent interstitial changes that may reflect edema. No pneumothorax. IMPRESSION:  Stable cardiomegaly. Similar small pleural effusions with adjacent atelectasis and probable mild interstitial edema. Electronically Signed   By: Macy Mis M.D.   On: 03/21/2021 09:16   ECHOCARDIOGRAM COMPLETE  Result Date: 03/21/2021    ECHOCARDIOGRAM REPORT   Patient Name:   [name] Date of Exam: 03/21/2021 Medical Rec #:  [id number]      Height:       68.0 in Accession #:    [id number]     Weight:       172.2 lb Date of Birth:  November 02, 1935      BSA:          1.918 m Patient Age:    40 years       BP:           175/76 mmHg Patient Gender: F              HR:           75 bpm. Exam Location:  Inpatient Procedure: 2D Echo, Cardiac Doppler and Color Doppler Indications:    I50.9 CHF  History:        Patient has prior history of Echocardiogram examinations, most                 recent 07/25/2020. Arrythmias:RBBB and Atrial Fibrillation; Risk                 Factors:Hypertension. DILATED  CARDIOMYOPATHY / RHUEMATIC MV DX.                  Mitral Valve: St. Jude mechanical valve valve is present in the                 mitral position. Procedure Date: 37.  Sonographer:    Beryle Beams Referring Phys: [id number] Regan Lemming  Sonographer Comments: Suboptimal subcostal window. IMPRESSIONS  1. Left ventricular ejection fraction, by estimation, is 35 to 40%. The left ventricle has moderately decreased function. The left ventricle demonstrates global hypokinesis. There is moderate left ventricular hypertrophy. Left ventricular diastolic function could not be evaluated.  2. Right ventricular systolic function is mildly reduced. The right ventricular size is normal.  3. Left atrial size was Massively dilated at 126 ml/m2.  4. Right atrial size was severely dilated.  5. The mitral valve has been repaired/replaced. Trivial mitral valve regurgitation. No evidence of mitral stenosis. There is a St. Jude mechanical valve present in the mitral position. Procedure Date: 66. Echo findings are consistent with normal structure and function of the mitral valve prosthesis.  6. The aortic valve is calcified. Aortic valve regurgitation is not visualized. Mild to moderate aortic valve stenosis. Aortic valve area, by VTI measures 1.48 cm. Aortic valve mean gradient measures 8.0 mmHg. Aortic valve Vmax measures 1.94 m/s. Comparison(s): Changes from prior study are noted. 07/25/2020: LVEF 50-55%. FINDINGS  Left Ventricle: Left ventricular ejection fraction, by estimation, is 35 to 40%. The left ventricle has moderately decreased function. The left ventricle demonstrates global hypokinesis. The left ventricular internal cavity size was normal in size. There is moderate left ventricular hypertrophy. Left ventricular diastolic function could not be evaluated due to mitral valve replacement. Left ventricular diastolic function could not be evaluated. Right Ventricle: The right ventricular size is normal. No increase in right  ventricular wall thickness. Right ventricular systolic function is mildly reduced. Left Atrium: Left atrial size was Massively dilated at 126 ml/m2. Right Atrium: Right atrial size was severely dilated. Pericardium: There is no evidence of  pericardial effusion. Mitral Valve: The mitral valve has been repaired/replaced. Trivial mitral valve regurgitation. There is a St. Jude mechanical valve present in the mitral position. Procedure Date: 65. Echo findings are consistent with normal structure and function of the mitral valve prosthesis. No evidence of mitral valve stenosis. Tricuspid Valve: The tricuspid valve is grossly normal. Tricuspid valve regurgitation is mild. Aortic Valve: The aortic valve is calcified. Aortic valve regurgitation is not visualized. Mild to moderate aortic stenosis is present. Aortic valve mean gradient measures 8.0 mmHg. Aortic valve peak gradient measures 15.1 mmHg. Aortic valve area, by VTI  measures 1.48 cm. Pulmonic Valve: The pulmonic valve was not well visualized. Pulmonic valve regurgitation is not visualized. Aorta: The aortic root and ascending aorta are structurally normal, with no evidence of dilitation. IAS/Shunts: No atrial level shunt detected by color flow Doppler.  LEFT VENTRICLE PLAX 2D LVIDd:         4.60 cm LVIDs:         3.90 cm LV PW:         1.70 cm LV IVS:        1.60 cm LVOT diam:     1.90 cm LV SV:         55 LV SV Index:   29 LVOT Area:     2.84 cm  LV Volumes (MOD) LV vol d, MOD A2C: 76.8 ml LV vol d, MOD A4C: 56.2 ml LV vol s, MOD A2C: 37.0 ml LV vol s, MOD A4C: 39.1 ml LV SV MOD A2C:     39.8 ml LV SV MOD A4C:     56.2 ml LV SV MOD BP:      28.9 ml RIGHT VENTRICLE RV S prime:     7.07 cm/s TAPSE (M-mode): 1.1 cm LEFT ATRIUM              Index         RIGHT ATRIUM           Index LA diam:        5.10 cm  2.66 cm/m    RA Area:     27.50 cm LA Vol (A2C):   288.0 ml 150.17 ml/m  RA Volume:   99.90 ml  52.09 ml/m LA Vol (A4C):   147.0 ml 76.65 ml/m LA Biplane  Vol: 212.0 ml 110.54 ml/m  AORTIC VALVE                     PULMONIC VALVE AV Area (Vmax):    1.29 cm      PV Vmax:       0.52 m/s AV Area (Vmean):   1.18 cm      PV Vmean:      33.600 cm/s AV Area (VTI):     1.48 cm      PV VTI:        0.092 m AV Vmax:           194.00 cm/s   PV Peak grad:  1.1 mmHg AV Vmean:          133.000 cm/s  PV Mean grad:  1.0 mmHg AV VTI:            0.374 m AV Peak Grad:      15.1 mmHg AV Mean Grad:      8.0 mmHg LVOT Vmax:         88.30 cm/s LVOT Vmean:        55.200 cm/s LVOT VTI:  0.195 m LVOT/AV VTI ratio: 0.52  AORTA Ao Root diam: 2.60 cm Ao Asc diam:  2.80 cm TRICUSPID VALVE TR Peak grad:   33.6 mmHg TR Mean grad:   21.0 mmHg TR Vmax:        290.00 cm/s TR Vmean:       224.0 cm/s  SHUNTS Systemic VTI:  0.20 m Systemic Diam: 1.90 cm Lyman Bishop MD Electronically signed by Lyman Bishop MD Signature Date/Time: 03/21/2021/3:11:41 PM    Final     Cardiac Studies   Echocardiogram 03/21/2021: Impressions: 1. Left ventricular ejection fraction, by estimation, is 35 to 40%. The  left ventricle has moderately decreased function. The left ventricle  demonstrates global hypokinesis. There is moderate left ventricular  hypertrophy. Left ventricular diastolic  function could not be evaluated.   2. Right ventricular systolic function is mildly reduced. The right  ventricular size is normal.   3. Left atrial size was Massively dilated at 126 ml/m2.   4. Right atrial size was severely dilated.   5. The mitral valve has been repaired/replaced. Trivial mitral valve  regurgitation. No evidence of mitral stenosis. There is a St. Jude  mechanical valve present in the mitral position. Procedure Date: 4.  Echo findings are consistent with normal  structure and function of the mitral valve prosthesis.   6. The aortic valve is calcified. Aortic valve regurgitation is not  visualized. Mild to moderate aortic valve stenosis. Aortic valve area, by  VTI measures 1.48 cm. Aortic  valve mean gradient measures 8.0 mmHg.  Aortic valve Vmax measures 1.94 m/s.   Comparison(s): Changes from prior study are noted. 07/25/2020: LVEF 50-55%.  Patient Profile     86 y.o. female with PMH of chronic diastolic CHF, chronic atrial fibrillation on coumadin, St Jude mechanical MVR 1987, mild AS, NSVT, RBBB, HTN, CKD stage III, GERD and chronic anemia who presented on 3/1 with worsening dyspnea for 3 month. BNP elevated at 1633. Patient underwent IV diuresis  Assessment & Plan    Acute on chronic combined CHF - worsening dyspnea for 3 month. BNP 1633, Trop negative x 2 - Echo 7/22 showed EF 50-55%. Myoview in July 2022 was negative for ischemia - Echo 03/21/2021 EF 35-40% with global hypokinesis and moderate LVH - initially underwent IV diuresis, lasix held when Cr significant jumped from 1.8 to 2.87  - no ARB/ACEI given AKI. Continue to hold lasix. BMET this morning pending. Once renal function improve, may need to consider left and right heart cath given drop in EF.  - on Imdur, ideally add low dose hydralazine given LV dysfunction, however since Cardizem stopped, HR has been in the 90s. May need higher dose of BB. Hold off on [place] or Farxiga until renal function improves.  Chronic atrial fibrillation on coumadin  - rate controlled on diltiazem CD and lopressor. Cardizem stopped due to low EF. Will need to switch lopressor to toprol XL   H/o St Jude mechanical MVR 1987  HTN  CKD stage III:  Cr increased from 1.85 to 2.87 yesterday after diuresis.       For questions or updates, please contact [place] Please consult [email or web address] for contact info under        Signed, Almyra Deforest, [place]  03/23/2021, 8:44 AM    Personally seen and examined. Agree with above.  Appears tired.  Sharp S1 click mechanical mitral valve.  Irregularly irregular.  Telemetry with A-fib right bundle branch block heart rate upper 90s  Awaiting basic metabolic  profile.  Acute on chronic  combined heart failure - Dyspnea continues to worsen over the past 3 months.  Troponins were negative.  Echo this admit showed decrease in ejection fraction to 35 to 40% from 50 to 55%.  Her initial plan was to try IV diuresis however her creatinine jumped significantly. - On isosorbide.  On metoprolol tartrate 100 twice daily -Consider hydralazine for further afterload reduction and improvement in hypertension  Candee Furbish, MD

## 2021-03-23 NOTE — Progress Notes (Signed)
FPTS Interim Progress Note ? ?S: Patient states she has had diarrhea since this morning, nurse notes 6 episodes.  Denies that it is foul-smelling.  Patient states she has experienced diarrhea like this in the past which she thinks is related to her history of hemicolectomy.  She denies any other symptoms including abdominal pain. ? ?O: ?BP (!) 166/87   Pulse 92   Temp (!) 97.5 ?F (36.4 ?C) (Oral)   Resp 16   Ht 5\' 8"  (1.727 m)   Wt 78.5 kg   SpO2 94%   BMI 26.31 kg/m?   ?General: Patient lying in bed, NAD ?Abdomen: Normal bowel sounds, soft, nontender to palpation, nondistended ? ?A/P: ?Diarrhea ?Patient has received MiraLAX yesterday and the day before.  Did not receive MiraLAX today due to diarrhea.   ?-Continue to monitor  ?-Psyllium daily ?-d/c miralax ?-Flexi-Seal ordered ? ?Precious Gilding, DO ?03/23/2021, 2:40 PM ?PGY-1, [facility] ?Service pager [phone] ? ?

## 2021-03-24 DIAGNOSIS — J9601 Acute respiratory failure with hypoxia: Secondary | ICD-10-CM | POA: Diagnosis not present

## 2021-03-24 DIAGNOSIS — N179 Acute kidney failure, unspecified: Secondary | ICD-10-CM | POA: Diagnosis not present

## 2021-03-24 DIAGNOSIS — N1832 Chronic kidney disease, stage 3b: Secondary | ICD-10-CM | POA: Diagnosis not present

## 2021-03-24 DIAGNOSIS — I5023 Acute on chronic systolic (congestive) heart failure: Secondary | ICD-10-CM | POA: Diagnosis not present

## 2021-03-24 LAB — URINALYSIS, ROUTINE W REFLEX MICROSCOPIC
Bilirubin Urine: NEGATIVE
Glucose, UA: NEGATIVE mg/dL
Ketones, ur: NEGATIVE mg/dL
Leukocytes,Ua: NEGATIVE
Nitrite: NEGATIVE
Protein, ur: NEGATIVE mg/dL
RBC / HPF: >50 RBC/hpf — ABNORMAL HIGH (ref 0–5)
Specific Gravity, Urine: 1.011 (ref 1.005–1.030)
pH: 5 (ref 5.0–8.0)

## 2021-03-24 LAB — PROTIME-INR
INR: 4.3 (ref 0.8–1.2)
Prothrombin Time: 41.2 seconds — ABNORMAL HIGH (ref 11.4–15.2)

## 2021-03-24 LAB — RENAL FUNCTION PANEL
Albumin: 3.2 g/dL — ABNORMAL LOW (ref 3.5–5.0)
Anion gap: 11 (ref 5–15)
BUN: 63 mg/dL — ABNORMAL HIGH (ref 8–23)
CO2: 24 mmol/L (ref 22–32)
Calcium: 9.1 mg/dL (ref 8.9–10.3)
Chloride: 103 mmol/L (ref 98–111)
Creatinine, Ser: 4.34 mg/dL — ABNORMAL HIGH (ref 0.44–1.00)
GFR, Estimated: 10 mL/min — ABNORMAL LOW (ref >60)
Glucose, Bld: 110 mg/dL — ABNORMAL HIGH (ref 70–99)
Phosphorus: 4.7 mg/dL — ABNORMAL HIGH (ref 2.5–4.6)
Potassium: 3.5 mmol/L (ref 3.5–5.1)
Sodium: 138 mmol/L (ref 135–145)

## 2021-03-24 LAB — CBC
HCT: 37.1 % (ref 36.0–46.0)
Hemoglobin: 11.3 g/dL — ABNORMAL LOW (ref 12.0–15.0)
MCH: 26.8 pg (ref 26.0–34.0)
MCHC: 30.5 g/dL (ref 30.0–36.0)
MCV: 88.1 fL (ref 80.0–100.0)
Platelets: 203 10*3/uL (ref 150–400)
RBC: 4.21 MIL/uL (ref 3.87–5.11)
RDW: 16 % — ABNORMAL HIGH (ref 11.5–15.5)
WBC: 7.7 10*3/uL (ref 4.0–10.5)
nRBC: 0 % (ref 0.0–0.2)

## 2021-03-24 LAB — PROTEIN / CREATININE RATIO, URINE
Creatinine, Urine: 126.92 mg/dL
Protein Creatinine Ratio: 0.28 mg/mg{Cre} — ABNORMAL HIGH (ref 0.00–0.15)
Total Protein, Urine: 36 mg/dL

## 2021-03-24 NOTE — Progress Notes (Signed)
Admit: 03/20/2021 ?LOS: 4 ? ?62F AoCKD3 after presenting clinically wity AoC CHF exacerbation ? ?Subjective:  ?Excellent UOP after foley placed ?Renal US, after foley, did not show any e/o obstruction/HN ?SCr downtrending  ?CK was normal ?UP/C 0.28,  ?UA +hematuria (foley on warfarin) ? ?03/04 0701 - 03/05 0700 ?In: 1385 [P.O.:360] ?Out: 1825 [Urine:1825] ? ?Filed Weights  ? 03/21/21 [phone] 03/22/21 0607 03/24/21 0440  ?Weight: 78.1 kg 78.5 kg 78.6 kg  ? ? ?Scheduled Meds: ? Chlorhexidine Gluconate Cloth  6 each Topical Daily  ? dorzolamide-timolol  1 drop Both Eyes BID  ? isosorbide mononitrate  30 mg Oral Daily  ? latanoprost  1 drop Both Eyes QHS  ? metoprolol tartrate  100 mg Oral BID  ? pantoprazole  40 mg Oral Daily  ? psyllium  1 packet Oral Daily  ? senna  1 tablet Oral Daily  ? ?Continuous Infusions: ?PRN Meds:.acetaminophen, albuterol, diclofenac Sodium ? ?Current Labs: reviewed  ? ?Physical Exam:  Blood pressure 139/85, pulse 91, temperature 98.4 ?F (36.9 ?C), temperature source Axillary, resp. rate 19, height 5\' 8"  (1.727 m), weight 78.6 kg, SpO2 95 %. ?GEN: Elderly female, NAD ?ENT: NCAT ?EYES: EOMI ?CV: IRIR normal S1 and S2 ?PULM: Scattered wheezing, normal work of breathing ?ABD: Soft but significant suprapubic fullness not appreciated this AM ?SKIN: No rashes or lesions ?EXT: No significant lower extremity edema ? ?A ?AoCKD3: degree and rapidity of renal failure is unusaul and worrisome, not typical of changes associated with diuresis.  I think had some degree of BOO and now foley placed, good UOP and SCr downtrending.   ?AoC s/d CHF exacerbation, 1L [place], DIuretics on hold ?S/p mech MVR 1987, on long term warfarin ?AFIb ?HTN ?Hx/o R hemicolectomy             ? ?P ?Cont supportive care, foley ?Hold diuretics ?Trend labs for anotehr 24h ?Daily weights, Daily Renal Panel, Strict I/Os, Avoid nephrotoxins (NSAIDs, judicious IV Contrast)  ? ? ? MD ?03/24/2021, 10:26 AM ? ?Recent Labs  ?Lab  03/22/21 ?05/22/21 03/23/21 ?0746 03/24/21 ?0109  ?NA 139 139 138  ?K 4.1 4.1 3.5  ?CL 105 104 103  ?CO2 24 22 24   ?GLUCOSE 113* 114* 110*  ?BUN 37* 59* 63*  ?CREATININE 2.87* 5.37* 4.34*  ?CALCIUM 9.1 9.3 9.1  ?PHOS  --   --  4.7*  ? ?Recent Labs  ?Lab 03/20/21 ?1340 03/21/21 ?05/20/21 03/22/21 ?6144 03/23/21 ?3154 03/24/21 ?0109  ?WBC 7.3   < > 10.1 8.6 7.7  ?NEUTROABS 5.0  --   --   --   --   ?HGB 11.4*   < > 10.7* 11.8* 11.3*  ?HCT 39.5   < > 34.7* 38.2 37.1  ?MCV 92.7   < > 88.7 87.8 88.1  ?PLT 218   < > 145* 202 203  ? < > = values in this interval not displayed.  ? ? ? ? ? ? ? ? ? ?  ?

## 2021-03-24 NOTE — Progress Notes (Signed)
? ?Progress Note ? ?[name] ?Date of Encounter: 03/24/2021 ? ?[facility] Cardiologist: Cristopher Peru, MD  ? ?Subjective  ? ?Fairly comfortable no chest pain ? ?Inpatient Medications  ?  ?Scheduled Meds: ? Chlorhexidine Gluconate Cloth  6 each Topical Daily  ? dorzolamide-timolol  1 drop Both Eyes BID  ? isosorbide mononitrate  30 mg Oral Daily  ? latanoprost  1 drop Both Eyes QHS  ? metoprolol tartrate  100 mg Oral BID  ? pantoprazole  40 mg Oral Daily  ? psyllium  1 packet Oral Daily  ? senna  1 tablet Oral Daily  ? ?Continuous Infusions: ? ?PRN Meds: ?acetaminophen, albuterol, diclofenac Sodium  ? ?Vital Signs  ?  ?Vitals:  ? 03/23/21 2108 03/24/21 0434 03/24/21 0440 03/24/21 0726  ?BP: (!) 156/97 (!) 155/101  (!) 142/103  ?Pulse: 89 97  91  ?Resp: 19 20  19   ?Temp: 98.1 ?F (36.7 ?C) 98.1 ?F (36.7 ?C)  98.4 ?F (36.9 ?C)  ?TempSrc: Oral Axillary  Axillary  ?SpO2: 96% 98%  95%  ?Weight:   78.6 kg   ?Height:      ? ? ?Intake/Output Summary (Last 24 hours) at 03/24/2021 [id number] ?Last data filed at 03/24/2021 0500 ?Gross per 24 hour  ?Intake 1385 ml  ?Output 1825 ml  ?Net -440 ml  ? ?Last 3 Weights 03/24/2021 03/22/2021 03/21/2021  ?Weight (lbs) 173 lb 4.5 oz 173 lb 1 oz 172 lb 3.2 oz  ?Weight (kg) 78.6 kg 78.5 kg 78.109 kg  ?   ? ?Telemetry  ?  ?Atrial fibrillation rate controlled- Personally Reviewed ? ?ECG  ?  ?No new ? ?Physical Exam  ? ?GEN: No acute distress.   ?Neck: No JVD ?Cardiac: Irregularly irregular, no murmurs, rubs, or gallops.  ?Respiratory: Clear to auscultation bilaterally. ?GI: Soft, nontender, non-distended  ?MS: No edema; No deformity.  Foley catheter in place ?Neuro:  Nonfocal  ?Psych: Normal affect  ? ?Labs  ?  ?High Sensitivity Troponin:   ?Recent Labs  ?Lab 03/20/21 ?1340 03/20/21 ?1647  ?TROPONINIHS 14 14  ?   ?Chemistry ?Recent Labs  ?Lab 03/21/21 ?[id number] 03/22/21 ?[id number] 03/23/21 ?[id number] 03/24/21 ?0109  ?NA 140 139 139 138  ?K 4.7 4.1 4.1 3.5  ?CL 105 105 104 103  ?CO2 22 24 22 24    ?GLUCOSE 116* 113* 114* 110*  ?BUN 23 37* 59* 63*  ?CREATININE 1.85* 2.87* 5.37* 4.34*  ?CALCIUM 9.6 9.1 9.3 9.1  ?MG 1.8 2.4 2.4  --   ?ALBUMIN  --   --   --  3.2*  ?GFRNONAA 27* 16* 7* 10*  ?ANIONGAP 13 10 13 11   ?  ?Lipids No results for input(s): CHOL, TRIG, HDL, LABVLDL, LDLCALC, CHOLHDL in the last 168 hours.  ?Hematology ?Recent Labs  ?Lab 03/22/21 ?0612 03/23/21 ?0746 03/24/21 ?0109  ?WBC 10.1 8.6 7.7  ?RBC 3.91 4.35 4.21  ?HGB 10.7* 11.8* 11.3*  ?HCT 34.7* 38.2 37.1  ?MCV 88.7 87.8 88.1  ?MCH 27.4 27.1 26.8  ?MCHC 30.8 30.9 30.5  ?RDW 16.0* 16.0* 16.0*  ?PLT 145* 202 203  ? ?Thyroid No results for input(s): TSH, FREET4 in the last 168 hours.  ?BNP ?Recent Labs  ?Lab 03/20/21 ?1340 03/22/21 ?0612  ?BNP 1,633.6* 1,374.9*  ?  ?DDimer No results for input(s): DDIMER in the last 168 hours.  ? ?Radiology  ?  ?US RENAL ? ?Result Date: 03/23/2021 ?CLINICAL DATA:  Acute renal failure. EXAM: RENAL / URINARY TRACT ULTRASOUND COMPLETE COMPARISON:  CT abdomen and pelvis 02/12/2006. FINDINGS: Right Kidney: Renal measurements: 9.1 x 3.8 x 4.0 cm = volume: 72 mL. Echogenicity within normal limits. No mass or hydronephrosis visualized. Left Kidney: Renal measurements: 9.7 x 5.4 x 5.3 cm = volume: 144 mL. Echogenicity within normal limits. No hydronephrosis. There is a 4 mm punctate echogenic area within the mid left kidney. Bladder: Appears normal for degree of bladder distention. Foley catheter is present. Other: None. IMPRESSION: 1. No hydronephrosis. 2. Punctate echogenic area in the left kidney may represent a calcification versus angiomyolipoma. 3. Foley catheter in the bladder. Electronically Signed   By: Ronney Asters M.D.   On: 03/23/2021 17:44   ? ?Cardiac Studies  ? ?EF 35 to 40% ? ?Patient Profile  ?   ?86 y.o. female with PMH of chronic diastolic CHF, chronic atrial fibrillation on coumadin, St Jude mechanical MVR 1987, mild AS, NSVT, RBBB, HTN, CKD stage III, GERD and chronic anemia who presented on 3/1 with  worsening dyspnea for 3 month. BNP elevated at 1633 ? ?Assessment & Plan  ?  ?Acute on chronic combined heart failure ?- Appreciate nephrology's assistance given her ongoing renal issues. ?-Currently on isosorbide 30 mg ?- Metoprolol 100 mg twice a day.  Consolidate to metoprolol succinate prior to discharge. ? ?Saint Jude mechanical mitral valve 0000000 ?- Sharp S1 click noted. ? ?Persistent atrial fibrillation ?- Rate controlled on metoprolol. ? ?Chronic anticoagulation ?- On Coumadin for mechanical mitral valve.  Supratherapeutic. ? ?Acute kidney injury ?- Possibly postobstructive.  Foley catheter placed.  Diuretics on hold.  No hydronephrosis.  Mild improvement in creatinine today. ? ? ?For questions or updates, please contact [place] ?Please consult [email or web address] for contact info under  ? ?  ?   ?Signed, ?Candee Furbish, MD  ?03/24/2021, 9:22 AM   ? ?

## 2021-03-24 NOTE — Progress Notes (Addendum)
Family Medicine Teaching Service ?Daily Progress Note ?Intern Pager: [phone] ? ?[name] Medical record number: [id number] ?Date of birth: 09-06-1935 Age: 86 y.o. Gender: female ? ?Primary Care Provider: Dickie La, MD ?Consultants: Cardiology, Nephrology ?Code Status: Full ? ?Pt Overview and Major Events to Date:  ?03/20/21 - admitted ?03/21/21 - ARF ?03/23/21 - BOO and foley placed ? ?Assessment and Plan: ? ?Acute hypoxic respiratory failure secondary to new HFrEF-improving ?Patient now on room air maintaining SPO2 greater than 92%.  Since Foley catheter placed, she has had 1.825 L urinary output receiving Lasix. Her physcial exam shows improvement in peripheral edema: decrased JVP, +1 edema to shins b/l (was +3), no sacral edema, lungs without crackles/wheezing, though diminished breath sounds. SpO2 >92% on RA during exam today. Her GFR is mildly improved today from 7-10.  We will continue to hold Lasix and allow her renal function to improve. Cardiology planning likely L/R heart cath given new HFrEF and global hypokinesis found on echo. She and her daughters have not discussed goals of care, but she is interested in assistance with this.  ?- Cardiology consulted, appreciate recommendations ?- Continue to hold lasix ?- Continue home imdur, metoprolol ?- Strict I/Os, daily weights ?- AM BMP ?- Palliative care consult ? ?Acute renal failure on CKD stage III ?Patient had rapid onset of ARF in setting of receiving lasix for heart failure exacerbation, much faster than would be otherwise expected. There is an element of bladder outlet obstruction at play, given that patient had UOP of 1.825 L with foley catheter, though she was making some urine prior to that. Renal US was performed s/p foley catheter, no hydronephrosis ?- Nephrology consulted, appreciate recommendations ?- Continue foley catheter ?- Follow renal function ?- Avoid nephrotoxins (NSAIDs, judicious IV contrast) ? ?S/p mechanical mitral  valve  persistent A fib ?Coumadin held due to supratherapeutic INR level, persistent today to 4.3. Will change to LMWH (lovenox) for upcoming heart cath. HR controlled today, despite d/c cardizem (for new HFrEF). ?- Continue metoprolol tartrate 50 mg BID ?- Monitor PT/INR daily ? ?Hypertension ?BP's today mildly elevated to 142/103, this afternoon improved to 139.85. Hydralazine added to home regimen of metoprolol tartrate 100 mg BID. Cardizem held due to HFrEF diagnosis. ?- Continue to monitor ?- Continue hydralazine, metoprolol tartrate ? ?Delirium on mild cognitive disorder- resolved ?Patient had an episode of delirum on 3/2-3/3 that has not repeated. Today she was Aox3. She does have a history of underlying cognitive disorder, she is responsible for her ADLs at home. Lives alone, has a daughter that is very involved in her care. ? ?Constipation ?-Continue miralax, senna daily ? ?GERD ?-Continue home pantoprazole 40 mg qd ? ?Prolonged QTc ?QTc slightly elevated to 484 at admission ?-Judiciously use QTc prolonging medications. ? ?FEN/GI: heart healthy diet ?PPx: lovenox ?Dispo:Pending PT recommendations  pending clinical improvement . Barriers include awaiting medical improvement.  ? ?Subjective:  ?Patient reports feeling a bit better. Still short of breath, but much better than at admission. ? ?Objective: ?Temp:  [97.5 ?F (36.4 ?C)-98.4 ?F (36.9 ?C)] 98.4 ?F (36.9 ?C) (03/05 [id number]) ?Pulse Rate:  [89-97] 91 (03/05 0726) ?Resp:  [19-20] 19 (03/05 0726) ?BP: (139-156)/(85-103) 139/85 (03/05 0956) ?SpO2:  [95 %-98 %] 95 % (03/05 0726) ?Weight:  [78.6 kg] 78.6 kg (03/05 0440) ?Physical Exam: ?General: elderly, AAW, resting comfortably in bed, NAD, WNWD ?Cardiovascular: irregularly irregular rhythm, regular rate, sharp S 1 click, no m/r/g, 2+ radial and DP pulses ?Respiratory: Diminished breath  sounds in all lung fields, no wheezing, no crackles, no iWOB ?Abdomen: soft, NT, ND, normal bowel sounds ?Extremities: no  sacral edema, 1+ b/l peripheral edema ? ?Laboratory: ?Recent Labs  ?Lab 03/22/21 ?0612 03/23/21 ?0746 03/24/21 ?0109  ?WBC 10.1 8.6 7.7  ?HGB 10.7* 11.8* 11.3*  ?HCT 34.7* 38.2 37.1  ?PLT 145* 202 203  ? ?Recent Labs  ?Lab 03/22/21 ?[id number] 03/23/21 ?0746 03/24/21 ?0109  ?NA 139 139 138  ?K 4.1 4.1 3.5  ?CL 105 104 103  ?CO2 24 22 24   ?BUN 37* 59* 63*  ?CREATININE 2.87* 5.37* 4.34*  ?CALCIUM 9.1 9.3 9.1  ?GLUCOSE 113* 114* 110*  ? ?Imaging/Diagnostic Tests: ?US RENAL ? ?Result Date: 03/23/2021 ?CLINICAL DATA:  Acute renal failure. EXAM: RENAL / URINARY TRACT ULTRASOUND COMPLETE COMPARISON:  CT abdomen and pelvis 02/12/2006. FINDINGS: Right Kidney: Renal measurements: 9.1 x 3.8 x 4.0 cm = volume: 72 mL. Echogenicity within normal limits. No mass or hydronephrosis visualized. Left Kidney: Renal measurements: 9.7 x 5.4 x 5.3 cm = volume: 144 mL. Echogenicity within normal limits. No hydronephrosis. There is a 4 mm punctate echogenic area within the mid left kidney. Bladder: Appears normal for degree of bladder distention. Foley catheter is present. Other: None. IMPRESSION: 1. No hydronephrosis. 2. Punctate echogenic area in the left kidney may represent a calcification versus angiomyolipoma. 3. Foley catheter in the bladder. Electronically Signed   By: Ronney Asters M.D.   On: 03/23/2021 17:44   ? ?Gladys Damme, MD ?03/24/2021, 12:17 PM ?PGY-3, [place] ?[facility] Intern pager: [phone], text pages welcome ? ?

## 2021-03-24 NOTE — Progress Notes (Addendum)
ANTICOAGULATION CONSULT NOTE - Follow Up Consult ? ?Pharmacy Consult for Warfarin >> Heparin ?Indication:  Mechanical Mitral Valve ? ?Allergies  ?Allergen Reactions  ? Esomeprazole Magnesium   ?  REACTION: stomach upset, nausea  ? ? ?Patient Measurements: ?Height: 5\' 8"  (172.7 cm) ?Weight: 78.6 kg (173 lb 4.5 oz) ?IBW/kg (Calculated) : 63.9 ? ?Vital Signs: ?Temp: 98.4 ?F (36.9 ?C) (03/05 [id number]) ?Temp Source: Axillary (03/05 0726) ?BP: 142/103 (03/05 0726) ?Pulse Rate: 91 (03/05 0726) ? ?Labs: ?Recent Labs  ?  03/22/21 ?0612 03/22/21 ?0613 03/23/21 ?[id number] 03/23/21 ?[id number] 03/24/21 ?0109  ?HGB 10.7*  --   --  11.8* 11.3*  ?HCT 34.7*  --   --  38.2 37.1  ?PLT 145*  --   --  202 203  ?LABPROT 42.1*  --  44.1*  --  41.2*  ?INR 4.4*  --  4.7*  --  4.3*  ?CREATININE  --  2.87*  --  5.37* 4.34*  ?CKTOTAL  --   --   --  50  --   ? ? ?Estimated Creatinine Clearance: 10.8 mL/min (A) (by C-G formula based on SCr of 4.34 mg/dL (H)). ? ? ?Medical History: ?Past Medical History:  ?Diagnosis Date  ? Asthma   ? years ago  ? Chronic atrial fibrillation (HCC)   ? Dilated cardiomyopathy ()   ? history of this, now resolved  ? Dysphagia   ? Hx  ? Fracture of tibial plateau 05/21/2015  ? GERD (gastroesophageal reflux disease)   ? GI bleed   ? Hemorrhoid 04/2014  ? bleeding  ? History of blood transfusion   ? Hypertension   ? Microcytic anemia   ? Nonsustained ventricular tachycardia   ? Osteoporosis   ? Protein in urine   ? RBBB (right bundle branch block)   ? Rheumatic mitral valve disease   ? ? ?Medications:  ?Scheduled:  ? Chlorhexidine Gluconate Cloth  6 each Topical Daily  ? dorzolamide-timolol  1 drop Both Eyes BID  ? isosorbide mononitrate  30 mg Oral Daily  ? latanoprost  1 drop Both Eyes QHS  ? metoprolol tartrate  100 mg Oral BID  ? pantoprazole  40 mg Oral Daily  ? psyllium  1 packet Oral Daily  ? senna  1 tablet Oral Daily  ? ? ?Assessment: ?86 yo F with a history of mechanical mitral valve replacement, on long-term  anticoagulation with warfarin.  PTA warfarin dosing: 5 mg Sun/Tues/Thurs, 10 mg all other days.  Last warfarin dose given on 3/1 @ 2051.  Pharmacy consulted for heparin dosing.  ? ?Today INR is high at 4.3, but starting to trend down. CBC stable. Of note, per overnight RN - patient's urine color was pinkish and UA was obtained.  No other signs/symptoms of bleeding noted.   ? ?Goal of Therapy:  ?INR 2.5-3.5 ?Monitor platelets by anticoagulation protocol: Yes ?  ?Plan:  ?Hold warfarin for possible procedure.  ?Transition to heparin when INR < 2.5.  ?Daily CBC, INR.  ?Monitor for signs/symptoms of bleeding.  ? ? ?Vance Peper, PharmD ?PGY1 Pharmacy Resident ?Phone [phone] ?03/24/2021 8:53 AM  ? ?Please check AMION for all [facility] phone numbers ?After 10:00 PM, call [place] [phone] ? ? ? ?

## 2021-03-24 NOTE — Progress Notes (Signed)
? ?Progress Note ? ?[name] ?Date of Encounter: 03/25/2021 ? ?[facility] Cardiologist: Cristopher Peru, MD  ? ?Subjective  ? ?Feeling better this morning. Breathing improving.  ? ?Cr improving to 2.86 ?Net negative 670 off diuretics ?Wt 173>166lbs ? ?Inpatient Medications  ?  ?Scheduled Meds: ? Chlorhexidine Gluconate Cloth  6 each Topical Daily  ? dorzolamide-timolol  1 drop Both Eyes BID  ? hydrALAZINE  25 mg Oral Q8H  ? isosorbide mononitrate  30 mg Oral Daily  ? latanoprost  1 drop Both Eyes QHS  ? metoprolol succinate  100 mg Oral Daily  ? pantoprazole  40 mg Oral Daily  ? psyllium  1 packet Oral Daily  ? ?Continuous Infusions: ? ?PRN Meds: ?acetaminophen, albuterol, diclofenac Sodium, senna  ? ?Vital Signs  ?  ?Vitals:  ? 03/24/21 0956 03/24/21 2114 03/25/21 0422 03/25/21 0424  ?BP: 139/85 (!) 135/94 (!) 145/95   ?Pulse:  (!) 107 (!) 106   ?Resp:  20 20   ?Temp:  98.1 ?F (36.7 ?C) 98.1 ?F (36.7 ?C)   ?TempSrc:  Oral Oral   ?SpO2:  96% 96%   ?Weight:    75.5 kg  ?Height:      ? ? ?Intake/Output Summary (Last 24 hours) at 03/25/2021 0903 ?Last data filed at 03/25/2021 0500 ?Gross per 24 hour  ?Intake 960 ml  ?Output 1750 ml  ?Net -790 ml  ? ?Last 3 Weights 03/25/2021 03/24/2021 03/22/2021  ?Weight (lbs) 166 lb 7.2 oz 173 lb 4.5 oz 173 lb 1 oz  ?Weight (kg) 75.5 kg 78.6 kg 78.5 kg  ?   ? ?Telemetry  ?  ?Afib with brief episodes of RVR to 120s- Personally Reviewed ? ?ECG  ?  ?No new tracing today ? ?Physical Exam  ? ?GEN: No acute distress.   ?Neck: Mildly elevated JVD ?Cardiac: Irregularly irregular, no murmurs, rubs, or gallops. Mechanical S1 click, harsh systolic murmur ?Respiratory: Clear to auscultation bilaterally. ?GI: Soft, nontender, non-distended  ?MS: No edema; No deformity.  Foley catheter in place ?Neuro:  Nonfocal  ?Psych: Normal affect  ? ?Labs  ?  ?High Sensitivity Troponin:   ?Recent Labs  ?Lab 03/20/21 ?1340 03/20/21 ?1647  ?TROPONINIHS 14 14  ?   ?Chemistry ?Recent Labs  ?Lab  03/21/21 ?[id number] 03/22/21 ?[id number] 03/23/21 ?[id number] 03/24/21 ?0109 03/25/21 ?[id number]  ?NA 140 139 139 138 140  ?K 4.7 4.1 4.1 3.5 3.5  ?CL 105 105 104 103 107  ?CO2 22 24 22 24 24   ?GLUCOSE 116* 113* 114* 110* 97  ?BUN 23 37* 59* 63* 56*  ?CREATININE 1.85* 2.87* 5.37* 4.34* 2.86*  ?CALCIUM 9.6 9.1 9.3 9.1 9.0  ?MG 1.8 2.4 2.4  --   --   ?PROT  --   --   --   --  6.6  ?ALBUMIN  --   --   --  3.2* 2.9*  ?AST  --   --   --   --  18  ?ALT  --   --   --   --  15  ?ALKPHOS  --   --   --   --  38  ?BILITOT  --   --   --   --  0.9  ?GFRNONAA 27* 16* 7* 10* 16*  ?ANIONGAP 13 10 13 11 9   ?  ?Lipids No results for input(s): CHOL, TRIG, HDL, LABVLDL, LDLCALC, CHOLHDL in the last 168 hours.  ?Hematology ?Recent Labs  ?Lab 03/23/21 ?0746 03/24/21 ?0109  03/25/21 ?[id number]  ?WBC 8.6 7.7 7.4  ?RBC 4.35 4.21 3.94  ?HGB 11.8* 11.3* 10.6*  ?HCT 38.2 37.1 35.1*  ?MCV 87.8 88.1 89.1  ?MCH 27.1 26.8 26.9  ?MCHC 30.9 30.5 30.2  ?RDW 16.0* 16.0* 15.8*  ?PLT 202 203 183  ? ?Thyroid No results for input(s): TSH, FREET4 in the last 168 hours.  ?BNP ?Recent Labs  ?Lab 03/20/21 ?1340 03/22/21 ?0612  ?BNP 1,633.6* 1,374.9*  ?  ?DDimer No results for input(s): DDIMER in the last 168 hours.  ? ?Radiology  ?  ?US RENAL ? ?Result Date: 03/23/2021 ?CLINICAL DATA:  Acute renal failure. EXAM: RENAL / URINARY TRACT ULTRASOUND COMPLETE COMPARISON:  CT abdomen and pelvis 02/12/2006. FINDINGS: Right Kidney: Renal measurements: 9.1 x 3.8 x 4.0 cm = volume: 72 mL. Echogenicity within normal limits. No mass or hydronephrosis visualized. Left Kidney: Renal measurements: 9.7 x 5.4 x 5.3 cm = volume: 144 mL. Echogenicity within normal limits. No hydronephrosis. There is a 4 mm punctate echogenic area within the mid left kidney. Bladder: Appears normal for degree of bladder distention. Foley catheter is present. Other: None. IMPRESSION: 1. No hydronephrosis. 2. Punctate echogenic area in the left kidney may represent a calcification versus angiomyolipoma. 3. Foley catheter in the  bladder. Electronically Signed   By: Ronney Asters M.D.   On: 03/23/2021 17:44   ? ?Cardiac Studies  ? ?TTE 03/21/21: ?IMPRESSIONS  ? 1. Left ventricular ejection fraction, by estimation, is 35 to 40%. The  ?left ventricle has moderately decreased function. The left ventricle  ?demonstrates global hypokinesis. There is moderate left ventricular  ?hypertrophy. Left ventricular diastolic  ?function could not be evaluated.  ? 2. Right ventricular systolic function is mildly reduced. The right  ?ventricular size is normal.  ? 3. Left atrial size was Massively dilated at 126 ml/m2.  ? 4. Right atrial size was severely dilated.  ? 5. The mitral valve has been repaired/replaced. Trivial mitral valve  ?regurgitation. No evidence of mitral stenosis. There is a [employer] Jude  ?mechanical valve present in the mitral position. Procedure Date: 55.  ?Echo findings are consistent with normal  ?structure and function of the mitral valve prosthesis.  ? 6. The aortic valve is calcified. Aortic valve regurgitation is not  ?visualized. Mild to moderate aortic valve stenosis. Aortic valve area, by  ?VTI measures 1.48 cm?Marland Kitchen Aortic valve mean gradient measures 8.0 mmHg.  ?Aortic valve Vmax measures 1.94 m/s.  ? ?Comparison(s): Changes from prior study are noted. 07/25/2020: LVEF 50-55%. ? ?Patient Profile  ?   ?86 y.o. female with PMH of chronic diastolic CHF, chronic atrial fibrillation on coumadin, [employer] Jude mechanical MVR 1987, mild AS, NSVT, RBBB, HTN, CKD stage III, GERD and chronic anemia who presented on 3/1 with worsening dyspnea for 3 month. BNP elevated at 1633 ? ?Assessment & Plan  ?  ?#Acute on chronic combined heart failure: ?Newly diagnosed on this admission. TTE 03/21/21 with LVEF 35-40%, global hypokinesis, moderate LVH, mild RV systolic dysfunction, severe biatrial enlargement, mechanical MVR with normal function, mild-to-moderate AS. Lasix held due to worsening renal function with concern for possible obstruction given brisk UOP  after foley placement. No hydronephrosis on ultrasound.  Renal following ?- Diuretic management per renal ?- Continue imdur 30mg  daily ?- Start hydralazine 25mg  q8h ?- Change to metop 100mg  XL daily ?- Cannot add ACE/ARNI/ARB or spiro due to renal dysfunction; will add as able ?- Plan for possible RHC/LHC pending renal recovery ?- Monitor I/Os and daily weights ?-  Low Na diet ? ?#ARF CKD IIIa ?More rapid than would have been expected than with diuresis alone. Suspect element of bladder outlet obstruction given brisk diuresis after foley placement. Renal ultrasound with no hydronephrosis. Now improving. ?- Appreciate renal recommendations ?- Diuresis held ? ?#Saint Jude mechanical mitral valve 1987: ?TTE with normal functioning mechanical valve. ?- Routine follow-up as outpatient ?- Warfarin held due to supratherapeutic INR; resume once able ? ?#Persistent atrial fibrillation: ?- Rate controlled on metoprolol ?- Warfarin held due to supratherapeutic INR; resume once able ? ?#Mild-to-moderate AS: ?-Will need routine follow-up as out-patient ? ?#HTN: ?- Continue imdur 30mg  daily ?- Start hydralazine 25mg  q8h ?- Change to metop 100mg  XL daily ? ?For questions or updates, please contact [place] ?Please consult [email or web address] for contact info under  ? ?  ?   ?Signed, ?Freada Bergeron, MD  ?03/25/2021, 9:03 AM   ? ?

## 2021-03-24 NOTE — Plan of Care (Signed)

## 2021-03-24 NOTE — Progress Notes (Signed)
Speech Language Pathology Treatment: Dysphagia  ?Patient Details ?Name: [name] ?MRN: [id number] ?DOB: 1935-12-17 ?Today's Date: 03/24/2021 ?Time: 1330-1340 ?SLP Time Calculation (min) (ACUTE ONLY): 10 min ? ?Assessment / Plan / Recommendation ?Clinical Impression ? Pt was seen for diet tolerance with no c/o swallowing difficulty. She recently finished lunch, so was initially  not interested in graham cracker and water brought by SLP, but did agree to small trials. Pt had no s/s aspiration or dysphagia, but does continue with intermittent eructations, consistent with GERD diagnosis. CXR remains clear. At this time, pt is recommended for continuation of thin liquids and regular consistency solids with medical management of GERD as prescribed. ? ?No further ST indicated. ? ?  ?HPI HPI: 86 year old female presenting with dyspnea on exertion and chest pain.  PMH significant for HFpEF, s/p mechanical mitral valve on Coumadin, permanent A-fib, HTN, CKD stage III, osteoporosis, history of right hemicolectomy, cirrhosis, GERD. Per RN, pt with episodic coughing with PO prompting clinical swallow evaluation. ?  ?   ?SLP Plan ? All goals met ? ?  ?  ?Recommendations for follow up therapy are one component of a multi-disciplinary discharge planning process, led by the attending physician.  Recommendations may be updated based on patient status, additional functional criteria and insurance authorization. ?  ? ?Recommendations  ?Diet recommendations: Regular;Thin liquid ?Liquids provided via: Straw ?Medication Administration: Whole meds with liquid ?Supervision: Patient able to self feed ?Compensations: Small sips/bites ?Postural Changes and/or Swallow Maneuvers: Seated upright 90 degrees;Upright 30-60 min after meal  ?   ?   ? ? ? ? Oral Care Recommendations: Oral care BID ?Follow Up Recommendations: No SLP follow up ?SLP Visit Diagnosis: Dysphagia, unspecified (R13.10) ?Plan: All goals met ? ? ? ? ?  ?  ? ?Lucill Mauck P.  Annasofia Pohl, M.S., CCC-SLP ?Speech-Language Pathologist ?Acute Rehabilitation Services ?Pager: [phone] ? ?[place] ? ?03/24/2021, 1:50 PM ?

## 2021-03-25 DIAGNOSIS — Z7189 Other specified counseling: Secondary | ICD-10-CM

## 2021-03-25 DIAGNOSIS — I5023 Acute on chronic systolic (congestive) heart failure: Secondary | ICD-10-CM | POA: Diagnosis not present

## 2021-03-25 DIAGNOSIS — J9601 Acute respiratory failure with hypoxia: Secondary | ICD-10-CM | POA: Diagnosis not present

## 2021-03-25 DIAGNOSIS — N179 Acute kidney failure, unspecified: Secondary | ICD-10-CM | POA: Diagnosis not present

## 2021-03-25 LAB — BASIC METABOLIC PANEL
Anion gap: 9 (ref 5–15)
BUN: 56 mg/dL — ABNORMAL HIGH (ref 8–23)
CO2: 24 mmol/L (ref 22–32)
Calcium: 9 mg/dL (ref 8.9–10.3)
Chloride: 107 mmol/L (ref 98–111)
Creatinine, Ser: 2.86 mg/dL — ABNORMAL HIGH (ref 0.44–1.00)
GFR, Estimated: 16 mL/min — ABNORMAL LOW (ref >60)
Glucose, Bld: 97 mg/dL (ref 70–99)
Potassium: 3.5 mmol/L (ref 3.5–5.1)
Sodium: 140 mmol/L (ref 135–145)

## 2021-03-25 LAB — HEPATIC FUNCTION PANEL
ALT: 15 U/L (ref 0–44)
AST: 18 U/L (ref 15–41)
Albumin: 2.9 g/dL — ABNORMAL LOW (ref 3.5–5.0)
Alkaline Phosphatase: 38 U/L (ref 38–126)
Bilirubin, Direct: 0.2 mg/dL (ref 0.0–0.2)
Indirect Bilirubin: 0.7 mg/dL (ref 0.3–0.9)
Total Bilirubin: 0.9 mg/dL (ref 0.3–1.2)
Total Protein: 6.6 g/dL (ref 6.5–8.1)

## 2021-03-25 LAB — PROTIME-INR
INR: 3.9 — ABNORMAL HIGH (ref 0.8–1.2)
Prothrombin Time: 37.8 seconds — ABNORMAL HIGH (ref 11.4–15.2)

## 2021-03-25 LAB — CBC
HCT: 35.1 % — ABNORMAL LOW (ref 36.0–46.0)
Hemoglobin: 10.6 g/dL — ABNORMAL LOW (ref 12.0–15.0)
MCH: 26.9 pg (ref 26.0–34.0)
MCHC: 30.2 g/dL (ref 30.0–36.0)
MCV: 89.1 fL (ref 80.0–100.0)
Platelets: 183 10*3/uL (ref 150–400)
RBC: 3.94 MIL/uL (ref 3.87–5.11)
RDW: 15.8 % — ABNORMAL HIGH (ref 11.5–15.5)
WBC: 7.4 10*3/uL (ref 4.0–10.5)
nRBC: 0 % (ref 0.0–0.2)

## 2021-03-25 MED ORDER — WARFARIN - PHARMACIST DOSING INPATIENT: Freq: Every day | Status: DC

## 2021-03-25 MED ORDER — METOPROLOL SUCCINATE ER 100 MG PO TB24
100.0000 mg | ORAL_TABLET | Freq: Once | ORAL | Status: AC
Start: 2021-03-25 — End: 2021-03-25
  Administered 2021-03-25: 100 mg via ORAL
  Filled 2021-03-25: qty 1

## 2021-03-25 MED ORDER — SENNA 8.6 MG PO TABS: 1.0000 | ORAL_TABLET | Freq: Every day | ORAL | Status: DC | PRN

## 2021-03-25 MED ORDER — TAMSULOSIN HCL 0.4 MG PO CAPS
0.4000 mg | ORAL_CAPSULE | Freq: Every day | ORAL | Status: DC
  Administered 2021-03-25 – 2021-04-03 (×10): 0.4 mg via ORAL
  Filled 2021-03-25 (×10): qty 1

## 2021-03-25 MED ORDER — HYDRALAZINE HCL 25 MG PO TABS
25.0000 mg | ORAL_TABLET | Freq: Three times a day (TID) | ORAL | Status: DC
  Administered 2021-03-25 – 2021-03-27 (×6): 25 mg via ORAL
  Filled 2021-03-25 (×7): qty 1

## 2021-03-25 MED ORDER — METOPROLOL SUCCINATE ER 100 MG PO TB24
200.0000 mg | ORAL_TABLET | Freq: Every day | ORAL | Status: DC
  Administered 2021-03-26 – 2021-03-28 (×3): 200 mg via ORAL
  Filled 2021-03-25 (×3): qty 2

## 2021-03-25 MED ORDER — METOPROLOL SUCCINATE ER 100 MG PO TB24
100.0000 mg | ORAL_TABLET | Freq: Every day | ORAL | Status: DC
  Administered 2021-03-25: 100 mg via ORAL
  Filled 2021-03-25: qty 1

## 2021-03-25 NOTE — Progress Notes (Signed)
[facility] ?ROUNDING NOTE  ? ?Subjective:  ? ?Interval History: This is an 86 year old lady with a history of acute on chronic renal failure.  Renal ultrasound after Foley catheter did not show any evidence of obstruction.  Creatinine seems to be improving with good urine output.  She did have some microscopic hematuria that appear to be related to some Foley trauma. ? ?Blood pressure 149/95 pulse 111 temperature 98.1 O2 sats 97% room air.  Urine output 2.5 L 03/24/2020 ? ?Sodium 140 potassium 3.5 chloride 107 CO2 24 BUN 56 creatinine 2.86 glucose 97 calcium 9 phosphorus 4.7 magnesium 2.4 hemoglobin 10.6 ? ? ?Objective:  ?Vital signs in last 24 hours:  ?Temp:  [98.1 ?F (36.7 ?C)] 98.1 ?F (36.7 ?C) (03/06 0422) ?Pulse Rate:  [106-107] 106 (03/06 0422) ?Resp:  [20] 20 (03/06 0422) ?BP: (135-145)/(85-95) 145/95 (03/06 0422) ?SpO2:  [96 %] 96 % (03/06 0422) ?Weight:  [75.5 kg] 75.5 kg (03/06 0424) ? ?Weight change: -3.1 kg ?Filed Weights  ? 03/22/21 0607 03/24/21 0440 03/25/21 0424  ?Weight: 78.5 kg 78.6 kg 75.5 kg  ? ? ?Intake/Output: ?I/O last 3 completed shifts: ?In: 1440 [P.O.:1440] ?Out: 3575 [Urine:3575] ?  ?Intake/Output this shift: ? No intake/output data recorded. ? ?CVS- RRR ?RS- CTA ?ABD- BS present soft non-distended ?EXT- no edema ? ? ?Basic Metabolic Panel: ?Recent Labs  ?Lab 03/21/21 ?9983 03/22/21 ?3825 03/23/21 ?0539 03/24/21 ?0109 03/25/21 ?7673  ?NA 140 139 139 138 140  ?K 4.7 4.1 4.1 3.5 3.5  ?CL 105 105 104 103 107  ?CO2 22 24 22 24 24   ?GLUCOSE 116* 113* 114* 110* 97  ?BUN 23 37* 59* 63* 56*  ?CREATININE 1.85* 2.87* 5.37* 4.34* 2.86*  ?CALCIUM 9.6 9.1 9.3 9.1 9.0  ?MG 1.8 2.4 2.4  --   --   ?PHOS  --   --   --  4.7*  --   ? ? ?Liver Function Tests: ?Recent Labs  ?Lab 03/24/21 ?0109  ?ALBUMIN 3.2*  ? ?No results for input(s): LIPASE, AMYLASE in the last 168 hours. ?No results for input(s): AMMONIA in the last 168 hours. ? ?CBC: ?Recent Labs  ?Lab 03/20/21 ?1340 03/21/21 ?05/21/21  03/22/21 ?05/22/21 03/23/21 ?05/23/21 03/24/21 ?0109 03/25/21 ?05/25/21  ?WBC 7.3 8.5 10.1 8.6 7.7 7.4  ?NEUTROABS 5.0  --   --   --   --   --   ?HGB 11.4* 12.1 10.7* 11.8* 11.3* 10.6*  ?HCT 39.5 40.1 34.7* 38.2 37.1 35.1*  ?MCV 92.7 89.9 88.7 87.8 88.1 89.1  ?PLT 218 196 145* 202 203 183  ? ? ?Cardiac Enzymes: ?Recent Labs  ?Lab 03/23/21 ?0746  ?CKTOTAL 50  ? ? ?BNP: ?Invalid input(s): POCBNP ? ?CBG: ?No results for input(s): GLUCAP in the last 168 hours. ? ?Microbiology: ?Results for orders placed or performed during the hospital encounter of 03/20/21  ?Resp Panel by RT-PCR (Flu A&B, Covid) Nasopharyngeal Swab     Status: None  ? Collection Time: 03/20/21  3:29 PM  ? Specimen: Nasopharyngeal Swab; Nasopharyngeal(NP) swabs in vial transport medium  ?Result Value Ref Range Status  ? SARS Coronavirus 2 by RT PCR NEGATIVE NEGATIVE Final  ?  Comment: (NOTE) ?SARS-CoV-2 target nucleic acids are NOT DETECTED. ? ?The SARS-CoV-2 RNA is generally detectable in upper respiratory ?specimens during the acute phase of infection. The lowest ?concentration of SARS-CoV-2 viral copies this assay can detect is ?138 copies/mL. A negative result does not preclude SARS-Cov-2 ?infection and should not be used as the sole basis  for treatment or ?other patient management decisions. A negative result may occur with  ?improper specimen collection/handling, submission of specimen other ?than nasopharyngeal swab, presence of viral mutation(s) within the ?areas targeted by this assay, and inadequate number of viral ?copies(<138 copies/mL). A negative result must be combined with ?clinical observations, patient history, and epidemiological ?information. The expected result is Negative. ? ?Fact Sheet for Patients:  ?BloggerCourse.com ? ?Fact Sheet for Healthcare Providers:  ?SeriousBroker.it ? ?This test is no t yet approved or cleared by the [place] FDA and  ?has been authorized for detection and/or  diagnosis of SARS-CoV-2 by ?FDA under an Emergency Use Authorization (EUA). This EUA will remain  ?in effect (meaning this test can be used) for the duration of the ?COVID-19 declaration under Section 564(b)(1) of the Act, 21 ?U.S.C.section 360bbb-3(b)(1), unless the authorization is terminated  ?or revoked sooner.  ? ? ?  ? Influenza A by PCR NEGATIVE NEGATIVE Final  ? Influenza B by PCR NEGATIVE NEGATIVE Final  ?  Comment: (NOTE) ?The Xpert Xpress SARS-CoV-2/FLU/RSV plus assay is intended as an aid ?in the diagnosis of influenza from Nasopharyngeal swab specimens and ?should not be used as a sole basis for treatment. Nasal washings and ?aspirates are unacceptable for Xpert Xpress SARS-CoV-2/FLU/RSV ?testing. ? ?Fact Sheet for Patients: ?BloggerCourse.com ? ?Fact Sheet for Healthcare Providers: ?SeriousBroker.it ? ?This test is not yet approved or cleared by the [place] FDA and ?has been authorized for detection and/or diagnosis of SARS-CoV-2 by ?FDA under an Emergency Use Authorization (EUA). This EUA will remain ?in effect (meaning this test can be used) for the duration of the ?COVID-19 declaration under Section 564(b)(1) of the Act, 21 U.S.C. ?section 360bbb-3(b)(1), unless the authorization is terminated or ?revoked. ? ?Performed at [facility], [address] [place], [place] ?[address] ?  ? ? ?Coagulation Studies: ?Recent Labs  ?  03/23/21 ?9030 03/24/21 ?0109 03/25/21 ?0923  ?LABPROT 44.1* 41.2* 37.8*  ?INR 4.7* 4.3* 3.9*  ? ? ?Urinalysis: ?Recent Labs  ?  03/24/21 ?0445  ?COLORURINE YELLOW  ?LABSPEC 1.011  ?PHURINE 5.0  ?GLUCOSEU NEGATIVE  ?HGBUR LARGE*  ?BILIRUBINUR NEGATIVE  ?KETONESUR NEGATIVE  ?PROTEINUR NEGATIVE  ?NITRITE NEGATIVE  ?LEUKOCYTESUR NEGATIVE  ?  ? ? ?Imaging: ?US RENAL ? ?Result Date: 03/23/2021 ?CLINICAL DATA:  Acute renal failure. EXAM: RENAL / URINARY TRACT ULTRASOUND COMPLETE COMPARISON:  CT abdomen and pelvis 02/12/2006.  FINDINGS: Right Kidney: Renal measurements: 9.1 x 3.8 x 4.0 cm = volume: 72 mL. Echogenicity within normal limits. No mass or hydronephrosis visualized. Left Kidney: Renal measurements: 9.7 x 5.4 x 5.3 cm = volume: 144 mL. Echogenicity within normal limits. No hydronephrosis. There is a 4 mm punctate echogenic area within the mid left kidney. Bladder: Appears normal for degree of bladder distention. Foley catheter is present. Other: None. IMPRESSION: 1. No hydronephrosis. 2. Punctate echogenic area in the left kidney may represent a calcification versus angiomyolipoma. 3. Foley catheter in the bladder. Electronically Signed   By: Darliss Cheney M.D.   On: 03/23/2021 17:44   ? ? ?Medications:  ? ? ? Chlorhexidine Gluconate Cloth  6 each Topical Daily  ? dorzolamide-timolol  1 drop Both Eyes BID  ? hydrALAZINE  25 mg Oral Q8H  ? isosorbide mononitrate  30 mg Oral Daily  ? latanoprost  1 drop Both Eyes QHS  ? metoprolol succinate  100 mg Oral Daily  ? pantoprazole  40 mg Oral Daily  ? psyllium  1 packet Oral Daily  ? ?  acetaminophen, albuterol, diclofenac Sodium, senna ? ?Assessment/ Plan:  ?Acute on chronic kidney disease.  Appears to be improving with downtrending creatinine and good urine output.  Etiology is not completely clear.  However there may have been an absence of bladder obstruction.  There were no indications for dialysis and will sign off patient today.  Continue to hold nephrotoxins. ?Hypertension/volume appears to be controlled ?Anemia stable ? ? LOS: 5 ?[name] ?@TODAY @8 :20 AM ?  ?

## 2021-03-25 NOTE — Progress Notes (Signed)
Family Medicine Teaching Service ?Daily Progress Note ?Intern Pager: [phone] ? ?Patient name: AKERAH SENTER Medical record number: [id number] ?Date of birth: Oct 06, 1935 Age: 86 y.o. Gender: female ? ?Primary Care Provider: Dickie La, MD ?Consultants: Cardiology, nephrology, palliative care ?Code Status: Full ? ?Pt Overview and Major Events to Date:  ?03/20/2021-admitted ?03/21/2021-ARF ?03/23/2021-BOO and Foley placed ? ?Assessment and Plan: ?Patient is an 86 year old female presenting with dyspnea on exertion and chest pain.  PMH significant for HFpEF (now HFrEF), s/p mechanical mitral valve on Coumadin, permanent A-fib, HTN, CKD stage III, osteoporosis, history of right hemicolectomy, GERD ? ?Acute hypoxic respiratory failure secondary to new HFrEF, improving ?Patient is breathing comfortably this morning on room air with oxygen saturation of 100%.  Lasix have continue to be held due to acute renal failure.  Patient has had a total urinary output of 1.75 L in the last 24 hours with a net output of 1.66 L since admit.  Weight is down to 75.5 kg from 78.6 kg yesterday and 79.7 since admission.  Urinary output increased after Foley placement on 3/4.  Metoprolol tartrate 100 mg twice daily was switched to succinate 100 mg daily per cardiology. Will increase succinate to 200 mg daily d/t HR up to 120's this morning.  ?-Cardiology consulted, appreciate recs ?-Continue to hold Lasix as pt has shown improvement after foley placed and renal function improving. ?-Continue home Imdur  ?-metoprolol succinate to 200 mg daily  ?-Add ACE/ARNI/ARB or spironolactone when able ?-Strict I's and O's and daily weights ?-A.m. BMP ?-Palliative care consulted ? ?Acute renal failure on CKD stage III ?Patient received 1 dose of Lasix 40 mg IV on 03/22/2021 followed by ARF.  Nephrology states this drastic change is unlikely to be due to Lasix and patient may have a component of bladder outlet obstruction.  Foley was placed on 3/4 with UOP of 1.8  to 5 L.  Renal ultrasound s/p Foley catheter showed no hydronephrosis.  Creatinine has improved 4.34> 2.86 and GFR improved from 10> 16.  ?-Nephrology consulted ?-Start Flomax 0.4 daily ?-Plan to remove foley tomorrow ?-CTM with daily renal panel ?-Avoid nephrotoxins ? ?S/p mechanical mitral valve  persistent A-fib ?Warfarin has continued to be held due to supratherapeutic INR level, continues to be elevated at 3.9.  Hepatic function checked today due to persistently elevated INR which came back WNL.  Current plan is to bridge patient to heparin when INR is subtherapeutic.  Cardizem was D/C due to new HFrEF.  Lopressor 100 mg twice daily also DC'd and switched to metoprolol succinate 100 mg daily per cardiology.  This morning patient's heart rate is elevated into the 1 teens/120s, will increase succinate to 200 mg daily.  ?-Cardiology following ?-increase Metoprolol succinate 200 mg daily ?-To heparin when INR is subtherapeutic ? ?Hypertension ?BP this morning 145/95. ?-Continue Imdur 30 mg daily ?-Hydralazine 25 mg every 8 hours ?-Start metoprolol 200 mg XL daily ? ?Delirium on mild cognitive disorder, resolved ?Patient had episode of delirium on 3/2-3/3 which has resolved.  Today she is alert and oriented. ? ?GERD ?-Continue home pantoprazole 40 mg daily ? ?Prolonged QTc ?QTc slightly elevated to 494 on admission ?-Judiciously use QTc prolonging medications ? ? ?FEN/GI: Heart healthy diet ?PPx: Plan for heparin ?Dispo: Pending clinical improvement ? ?Subjective:  ?Patient states she is feeling well this morning and has no complaints at this time. ? ?Objective: ?Temp:  [98.1 ?F (36.7 ?C)] 98.1 ?F (36.7 ?C) (03/06 0422) ?Pulse Rate:  [106-107] 106 (03/06  0422) ?Resp:  [20] 20 (03/06 0422) ?BP: (135-145)/(85-95) 145/95 (03/06 0422) ?SpO2:  [96 %] 96 % (03/06 0422) ?Weight:  [75.5 kg] 75.5 kg (03/06 0424) ?Physical Exam: ?General: Patient lying in bed, very pleasant this morning, NAD ?Cardiovascular: Irregularly  irregular and tachycardic ?Respiratory: CTAB, normal effort ?Abdomen: Soft, nontender to palpation, nondistended ?Extremities: No edema BLEs ? ?Laboratory: ?Recent Labs  ?Lab 03/23/21 ?0746 03/24/21 ?0109 03/25/21 ?[id number]  ?WBC 8.6 7.7 7.4  ?HGB 11.8* 11.3* 10.6*  ?HCT 38.2 37.1 35.1*  ?PLT 202 203 183  ? ?Recent Labs  ?Lab 03/23/21 ?0746 03/24/21 ?0109 03/25/21 ?[id number]  ?NA 139 138 140  ?K 4.1 3.5 3.5  ?CL 104 103 107  ?CO2 22 24 24   ?BUN 59* 63* 56*  ?CREATININE 5.37* 4.34* 2.86*  ?CALCIUM 9.3 9.1 9.0  ?PROT  --   --  6.6  ?BILITOT  --   --  0.9  ?ALKPHOS  --   --  38  ?ALT  --   --  15  ?AST  --   --  18  ?GLUCOSE 114* 110* 97  ? ? ? ? ?Precious Gilding, DO ?03/25/2021, 9:08 AM ?PGY-1, [facility] ?[place] Intern pager: [phone], text pages welcome ? ?

## 2021-03-25 NOTE — Progress Notes (Signed)
Patient sleeping and resting comfortably.  Rounded with primary RN.  Requesting removal of flexiseal as patient having no liquid stool. No other concerns voiced. Appreciated nightly round. ? ?Today's Vitals  ? 03/24/21 0953 03/24/21 0956 03/24/21 1930 03/24/21 2114  ?BP:  139/85  (!) 135/94  ?Pulse:    (!) 107  ?Resp:    20  ?Temp:    98.1 ?F (36.7 ?C)  ?TempSrc:    Oral  ?SpO2:    96%  ?Weight:      ?Height:      ?PainSc: 0-No pain 0-No pain 0-No pain   ? ?Body mass index is 26.35 kg/m?.  ? ?Diarrhea resolved ?Discontinue Flexiseal ?Continue Psyllium ?Switch Senna to daily prn ?Monitor for any worsening diarrhea ? ?Carollee Leitz, MD ?Family Medicine Residency    ?

## 2021-03-25 NOTE — Consult Note (Signed)
Consultation Note Date: 03/25/2021   [name]  DOB: 01-Nov-1935  MRN: [id number]  Age / Sex: 86 y.o., female  PCP: Dickie La, MD Referring Physician: Zenia Resides, MD  Reason for Consultation: Establishing goals of care  HPI/Patient Profile: 86 y.o. female  with past medical history of  HFpEF, s/p mechanical mitral valve on coumadin, permanent a fib, HTN, CKD stage III, osteoporosis, history of right hemicolectomy, cirrhosis, GERDadmitted on 03/20/2021 with dyspnea on exertion and chest pain.   Patient is in acute hypoxic respiratory failure secondary to new HFrEF, with acute renal failure on CKD stage 3a. Possible component of bladder outlet obstruction, as foley placed 3/4 with UOP of 1.7 to 5L. Patient's Cr and respiratory status are improving. PMT has been consulted to assist with goals of care conversation.  Clinical Assessment and Goals of Care:  I have reviewed medical records including EPIC notes, labs and imaging, received report from RN, assessed the patient and then met at the bedside to discuss diagnosis prognosis, GOC, EOL wishes, disposition and options.  I introduced Palliative Medicine as specialized medical care for people living with serious illness. It focuses on providing relief from the symptoms and stress of a serious illness. The goal is to improve quality of life for both the patient and the family.   Medical History Review and Understanding: [name] shares that she was born prematurely and may have had an arrhythmia since birth. She was on disability in [place] and her heart valve function worsened after moving to [place], leading to mechanical valve in 1987. She has "taken good care of it" over the years. We reviewed her current illness and she tells me that her kidneys have suddenly and rapidly worsened in function, now improved. She tells me her breathing and swelling  is improving as well. She feels that her "time is winding down" at 86 years of age and that she should begin have discussions about what the rest of her life will look like.  Social History: [name] is from [place] and has 5 living daughters, 1 deceased daughter. She lives home alone at an apartment complex for seniors.  Functional and Nutritional State: [name] tells me she has had more trouble getting around for 2-3 months. She attributes this to worsening fatigue, swelling in her legs, and generalized aching over her whole body. She is still able to care for herself, bathing/dressing/cooking, but now she can only walk the distance of her mailbox before fatiguing (was able to get back to apartment previously). She uses a cane to ambulate. She reports a fluctuating appetite that is "seldom good." Also reports that eating meat now makes her feel like she is choking.   Palliative Symptoms: Fatigue, DOE, edema, generalized pain  Advance Directives: A detailed discussion regarding advanced directives was had. Patient would like her daughter [name] to be a primary contact.  Code Status: Concepts specific to code status, artifical feeding and hydration, and rehospitalization were introduced and discussed.  Discussion: Had a lengthy  conversation about the progressive nature of [name]'s chronic conditions such as heart failure and kidney failure. As stated above, [name] agrees it is appropriate to be more proactive with anticipatory care planning, especially given the progressive nature of her conditions. This is all new to her and she looks to her daughter [name] to participate in these discussions. She tells me she has AD paperwork at home and would likely wait to take it to the bank for notarization, then return to her PCP's office. She is interested in how to manage her illnesses for more years with her loved ones. She would not want to suffer if her illness were to continue to progress, and she feels [name]  would honor her wishes if she could not speak for herself. [name] is alarmed at the concept of artificial feeding, HD, etc., likely would not want this. She would not want to move in with one of her daughters if she could not take care of herself, as she does not want to be a burden. [name] speaks positively of possible SNF if needed, looking forward to more socialization.    The difference between aggressive medical intervention and comfort care was considered in light of the patient's goals of care. Hospice and Palliative Care services outpatient were explained and offered.   Discussed the importance of continued conversation with family and the medical providers regarding overall plan of care and treatment options, ensuring decisions are within the context of the patients values and GOCs.   Questions and concerns were addressed.  Hard Choices booklet left for review. The family was encouraged to call with questions or concerns.  PMT will continue to support holistically.      SUMMARY OF RECOMMENDATIONS   -Continue full code/full scope treatment for now, ongoing discussions -Goal is to enjoy more years of life spent with family, continue at living home on her own as long as she can -Patient would consider SNF in the future and would not likely want artificial feeding, ongoing discussions  -PMT will continue to follow  Prognosis:  Unable to determine  Discharge Planning: To Be Determined      Primary Diagnoses: Present on Admission:  Anemia  Chronic atrial fibrillation (HCC)  CKD (chronic kidney disease), stage III ([place])   I have reviewed the medical record, interviewed the patient and family, and examined the patient. The following aspects are pertinent.  Past Medical History:  Diagnosis Date   Asthma    years ago   Chronic atrial fibrillation (HCC)    Dilated cardiomyopathy ([place])    history of this, now resolved   Dysphagia    Hx   Fracture of tibial plateau 05/21/2015    GERD (gastroesophageal reflux disease)    GI bleed    Hemorrhoid 04/2014   bleeding   History of blood transfusion    Hypertension    Microcytic anemia    Nonsustained ventricular tachycardia    Osteoporosis    Protein in urine    RBBB (right bundle branch block)    Rheumatic mitral valve disease    Social History   Socioeconomic History   Marital status: Divorced    Spouse name: Not on file   Number of children: 5   Years of education: 12   Highest education level: High school graduate  Occupational History   Occupation: Retired  Tobacco Use   Smoking status: Former    Packs/day: 1.50    Years: 15.00    Pack years: 22.50  Types: Cigarettes    Quit date: 5    Years since quitting: 36.2   Smokeless tobacco: Never   Tobacco comments:    quit 1987  Vaping Use   Vaping Use: Never used  Substance and Sexual Activity   Alcohol use: No   Drug use: No   Sexual activity: Not Currently  Other Topics Concern   Not on file  Social History Narrative   Patient lives alone in [place].   Patient has 3 children who live near her and offer support. 2 live up [place].   Patient is active in [place] and enjoys walking for exercise.    Social Determinants of Health   Financial Resource Strain: Not on file  Food Insecurity: Not on file  Transportation Needs: Not on file  Physical Activity: Not on file  Stress: Not on file  Social Connections: Not on file   Family History  Problem Relation Age of Onset   Cancer Father        prostate cancer   Hypertension Mother    Stroke Mother        CVA x 2   Cancer Sister        in the Bladder   Endometriosis Daughter    Stroke Sister    Blindness Sister    Hypertension Sister    Thyroid disease Daughter    Hypertension Daughter    Hypertension Daughter    Hypertension Daughter    Liver disease Daughter    Colon cancer Neg Hx    Breast cancer Neg Hx    Scheduled Meds:  Chlorhexidine Gluconate Cloth  6 each Topical Daily    dorzolamide-timolol  1 drop Both Eyes BID   hydrALAZINE  25 mg Oral Q8H   isosorbide mononitrate  30 mg Oral Daily   latanoprost  1 drop Both Eyes QHS   metoprolol succinate  100 mg Oral Once   [START ON 03/26/2021] metoprolol succinate  200 mg Oral Daily   pantoprazole  40 mg Oral Daily   psyllium  1 packet Oral Daily   tamsulosin  0.4 mg Oral Daily   Continuous Infusions: PRN Meds:.acetaminophen, albuterol, diclofenac Sodium, senna Medications Prior to Admission:  Prior to Admission medications   Medication Sig Start Date End Date Taking? Authorizing Provider  Cholecalciferol (VITAMIN D) 2000 units tablet Take 1 tablet (2,000 Units total) by mouth daily. 06/10/17  Yes Dickie La, MD  diltiazem (CARDIZEM CD) 240 MG 24 hr capsule Take 1 capsule (240 mg total) by mouth daily. 10/11/20  Yes Evans Lance, MD  dorzolamidel-timolol (COSOPT) 22.3-6.8 MG/ML SOLN ophthalmic solution Place 1 drop into both eyes in the morning and at bedtime. 03/02/17  Yes [provider]  isosorbide mononitrate (IMDUR) 30 MG 24 hr tablet TAKE 1 TABLET(30 MG) BY MOUTH DAILY Patient taking differently: Take 30 mg by mouth daily. 01/22/21  Yes Evans Lance, MD  LUMIGAN 0.01 % SOLN Place 1 drop into both eyes at bedtime. 01/26/17  Yes [provider]  metoprolol tartrate (LOPRESSOR) 100 MG tablet Take 1 tablet (100 mg total) by mouth 2 (two) times daily. 08/29/20 05/25/21 Yes Evans Lance, MD  pantoprazole (PROTONIX) 40 MG tablet TAKE 1 TABLET(40 MG) BY MOUTH DAILY Patient taking differently: Take 40 mg by mouth daily. 10/12/20  Yes Dickie La, MD  polyethylene glycol (MIRALAX / GLYCOLAX) 17 g packet Take 17 g by mouth daily as needed for mild constipation. 07/27/20  Yes Almyra Deforest, PA  potassium chloride (KLOR-CON) 10 MEQ tablet Take 2 tablets (20 mEq total) by mouth daily. Patient taking differently: Take 20 mEq by mouth 2 (two) times daily. 10/15/20 05/25/21 Yes Evans Lance, MD  warfarin  (COUMADIN) 10 MG tablet Take 1/2 to 1 tablet by mouth once daily as directed by anticoagulation clinic Patient taking differently: Take 5-10 mg by mouth See admin instructions. 84m once daily on Sunday's, Tuesday's, and Thursday's, and 150monce daily on all other days of the week 12/24/20  Yes TaEvans LanceMD   Allergies  Allergen Reactions   Esomeprazole Magnesium     REACTION: stomach upset, nausea   Review of Systems  All other systems reviewed and are negative.  Physical Exam Vitals and nursing note reviewed.  Constitutional:      General: She is not in acute distress. HENT:     Head: Normocephalic and atraumatic.  Cardiovascular:     Rate and Rhythm: Tachycardia present.  Pulmonary:     Effort: Pulmonary effort is normal.  Abdominal:     General: There is no distension.     Palpations: Abdomen is soft.  Skin:    General: Skin is warm and dry.  Neurological:     Mental Status: She is alert and oriented to person, place, and time.    Vital Signs: BP 110/87    Pulse (!) (P) 105    Temp 98.1 F (36.7 C) (Oral)    Resp (P) 20    Ht '5\' 8"'  (1.727 m)    Wt 75.5 kg    SpO2 98%    BMI 25.31 kg/m  Pain Scale: 0-10   Pain Score: Asleep   SpO2: SpO2: 98 % O2 Device:SpO2: 98 % O2 Flow Rate: .O2 Flow Rate (L/min): 2 L/min  IO: Intake/output summary:  Intake/Output Summary (Last 24 hours) at 03/25/2021 1208 Last data filed at 03/25/2021 0500 Gross per 24 hour  Intake 840 ml  Output 1000 ml  Net -160 ml    LBM: Last BM Date : 03/23/21 Baseline Weight: Weight: 79.7 kg Most recent weight: Weight: 75.5 kg     Palliative Assessment/Data: 60%    Total time: I spent 80 minutes in the care of the patient today in the above activities and documenting the encounter.  MDM: High   JoJohnell Comingsalliative Medicine Team Team phone # [phone] you for allowing the Palliative Medicine Team to assist in the care of this patient. Please utilize secure chat  with additional questions, if there is no response within 30 minutes please call the above phone number.  Palliative Medicine Team providers are available by phone from 7am to 7pm daily and can be reached through the team cell phone.  Should this patient require assistance outside of these hours, please call the patient's attending physician.

## 2021-03-25 NOTE — Plan of Care (Signed)

## 2021-03-25 NOTE — Care Management Important Message (Signed)
Important Message ? ?Patient Details  ?Name: [name] ?MRN: [id number] ?Date of Birth: 1935/03/19 ? ? ?Medicare Important Message Given:  Yes ? ? ? ? ?[name] ?03/25/2021, 9:24 AM ?

## 2021-03-25 NOTE — Progress Notes (Signed)
ANTICOAGULATION CONSULT NOTE - Follow Up Consult ? ?Pharmacy Consult for Warfarin ?Indication:  Mechanical Mitral Valve ? ?Allergies  ?Allergen Reactions  ? Esomeprazole Magnesium   ?  REACTION: stomach upset, nausea  ? ? ?Patient Measurements: ?Height: 5\' 8"  (172.7 cm) ?Weight: 75.5 kg (166 lb 7.2 oz) ?IBW/kg (Calculated) : 63.9 ? ?Vital Signs: ?Temp: 98 ?F (36.7 ?C) (03/06 1100) ?Temp Source: Oral (03/06 1100) ?BP: 133/86 (03/06 1340) ?Pulse Rate: 105 (03/06 1100) ? ?Labs: ?Recent Labs  ?  03/23/21 ?05/23/21 03/23/21 ?05/23/21 03/23/21 ?05/23/21 03/24/21 ?0109 03/25/21 ?05/25/21  ?HGB  --  11.8*   < > 11.3* 10.6*  ?HCT  --  38.2  --  37.1 35.1*  ?PLT  --  202  --  203 183  ?LABPROT 44.1*  --   --  41.2* 37.8*  ?INR 4.7*  --   --  4.3* 3.9*  ?CREATININE  --  5.37*  --  4.34* 2.86*  ?CKTOTAL  --  50  --   --   --   ? < > = values in this interval not displayed.  ? ? ? ?Estimated Creatinine Clearance: 15 mL/min (A) (by C-G formula based on SCr of 2.86 mg/dL (H)). ? ? ?Medical History: ?Past Medical History:  ?Diagnosis Date  ? Asthma   ? years ago  ? Chronic atrial fibrillation (HCC)   ? Dilated cardiomyopathy (HCC)   ? history of this, now resolved  ? Dysphagia   ? Hx  ? Fracture of tibial plateau 05/21/2015  ? GERD (gastroesophageal reflux disease)   ? GI bleed   ? Hemorrhoid 04/2014  ? bleeding  ? History of blood transfusion   ? Hypertension   ? Microcytic anemia   ? Nonsustained ventricular tachycardia   ? Osteoporosis   ? Protein in urine   ? RBBB (right bundle branch block)   ? Rheumatic mitral valve disease   ? ? ?Medications:  ?Scheduled:  ? Chlorhexidine Gluconate Cloth  6 each Topical Daily  ? dorzolamide-timolol  1 drop Both Eyes BID  ? hydrALAZINE  25 mg Oral Q8H  ? isosorbide mononitrate  30 mg Oral Daily  ? latanoprost  1 drop Both Eyes QHS  ? [START ON 03/26/2021] metoprolol succinate  200 mg Oral Daily  ? pantoprazole  40 mg Oral Daily  ? psyllium  1 packet Oral Daily  ? tamsulosin  0.4 mg Oral Daily  ? Warfarin -  Pharmacist Dosing Inpatient   Does not apply q1600  ? ? ?Assessment: ?86 yo F with a history of mechanical mitral valve replacement, on long-term anticoagulation with warfarin.  PTA warfarin dosing: 5 mg Sun/Tues/Thurs, 10 mg all other days.  Last warfarin dose given on 3/1 @ 2051.  Pharmacy consulted for warfarin dosing.  ? ?INR remains elevated at 3.9, but starting to trend down. CBC stable. LFTs WNL. No  signs/symptoms of bleeding noted.   ? ?Goal of Therapy:  ?INR 2.5-3.5 ?Monitor platelets by anticoagulation protocol: Yes ?  ?Plan:  ?Continue to hold warfarin for supratherapeutic INR  ?Daily CBC, INR.  ?Monitor for signs/symptoms of bleeding.  ? ?2052, PharmD ?PGY-1 Acute Care Resident  ?03/25/2021 3:17 PM  ? ? ? ? ? ?

## 2021-03-25 NOTE — Progress Notes (Signed)
No liquid stool since yesterday. Removed flexiseal per order. No issue on site.  ?

## 2021-03-25 NOTE — Plan of Care (Signed)
?  Problem: Education: ?Goal: Ability to demonstrate management of disease process will improve ?03/25/2021 1556 by Mamie Nick I, RN ?Outcome: Progressing ?03/25/2021 1555 by Mamie Nick I, RN ?Outcome: Progressing ?Goal: Ability to verbalize understanding of medication therapies will improve ?03/25/2021 1556 by Mamie Nick I, RN ?Outcome: Progressing ?03/25/2021 1555 by Mamie Nick I, RN ?Outcome: Progressing ?Goal: Individualized Educational Video(s) ?03/25/2021 1556 by Mamie Nick I, RN ?Outcome: Progressing ?03/25/2021 1555 by Mamie Nick I, RN ?Outcome: Progressing ?  ?Problem: Activity: ?Goal: Capacity to carry out activities will improve ?03/25/2021 1556 by Mamie Nick I, RN ?Outcome: Progressing ?03/25/2021 1555 by Mamie Nick I, RN ?Outcome: Progressing ?  ?Problem: Cardiac: ?Goal: Ability to achieve and maintain adequate cardiopulmonary perfusion will improve ?03/25/2021 1556 by Mamie Nick I, RN ?Outcome: Progressing ?03/25/2021 1555 by Mamie Nick I, RN ?Outcome: Progressing ?  ?

## 2021-03-26 DIAGNOSIS — N179 Acute kidney failure, unspecified: Secondary | ICD-10-CM | POA: Diagnosis not present

## 2021-03-26 DIAGNOSIS — J9601 Acute respiratory failure with hypoxia: Secondary | ICD-10-CM | POA: Diagnosis not present

## 2021-03-26 DIAGNOSIS — I4891 Unspecified atrial fibrillation: Secondary | ICD-10-CM

## 2021-03-26 DIAGNOSIS — I5023 Acute on chronic systolic (congestive) heart failure: Secondary | ICD-10-CM | POA: Diagnosis not present

## 2021-03-26 DIAGNOSIS — Z7189 Other specified counseling: Secondary | ICD-10-CM | POA: Diagnosis not present

## 2021-03-26 LAB — BASIC METABOLIC PANEL
Anion gap: 10 (ref 5–15)
BUN: 48 mg/dL — ABNORMAL HIGH (ref 8–23)
CO2: 25 mmol/L (ref 22–32)
Calcium: 9.3 mg/dL (ref 8.9–10.3)
Chloride: 105 mmol/L (ref 98–111)
Creatinine, Ser: 2.11 mg/dL — ABNORMAL HIGH (ref 0.44–1.00)
GFR, Estimated: 23 mL/min — ABNORMAL LOW (ref >60)
Glucose, Bld: 99 mg/dL (ref 70–99)
Potassium: 3.7 mmol/L (ref 3.5–5.1)
Sodium: 140 mmol/L (ref 135–145)

## 2021-03-26 LAB — PROTIME-INR
INR: 2.6 — ABNORMAL HIGH (ref 0.8–1.2)
Prothrombin Time: 27.5 seconds — ABNORMAL HIGH (ref 11.4–15.2)

## 2021-03-26 LAB — CBC
HCT: 39.3 % (ref 36.0–46.0)
Hemoglobin: 11.8 g/dL — ABNORMAL LOW (ref 12.0–15.0)
MCH: 26.9 pg (ref 26.0–34.0)
MCHC: 30 g/dL (ref 30.0–36.0)
MCV: 89.5 fL (ref 80.0–100.0)
Platelets: 182 10*3/uL (ref 150–400)
RBC: 4.39 MIL/uL (ref 3.87–5.11)
RDW: 15.6 % — ABNORMAL HIGH (ref 11.5–15.5)
WBC: 7.4 10*3/uL (ref 4.0–10.5)
nRBC: 0 % (ref 0.0–0.2)

## 2021-03-26 MED ORDER — HEPARIN (PORCINE) 25000 UT/250ML-% IV SOLN: 1000.0000 [IU]/h | INTRAVENOUS | Status: DC

## 2021-03-26 MED ORDER — WARFARIN SODIUM 5 MG PO TABS: 5.0000 mg | ORAL_TABLET | Freq: Once | ORAL | Status: DC

## 2021-03-26 MED ORDER — HEPARIN (PORCINE) 25000 UT/250ML-% IV SOLN
1100.0000 [IU]/h | INTRAVENOUS | Status: DC
  Administered 2021-03-26: 1000 [IU]/h via INTRAVENOUS
  Administered 2021-03-27 – 2021-04-01 (×6): 1100 [IU]/h via INTRAVENOUS
  Filled 2021-03-26 (×7): qty 250

## 2021-03-26 MED ORDER — METOPROLOL TARTRATE 25 MG PO TABS: 25.0000 mg | ORAL_TABLET | Freq: Three times a day (TID) | ORAL | Status: DC | PRN

## 2021-03-26 NOTE — Progress Notes (Signed)
? ?                                                                                                                                                     ?                                                   ?Daily Progress Note  ? ?[name]       Date: 03/26/2021 ?DOB: 30-Jun-1935  Age: 86 y.o. MRN#: [id number] ?Attending Physician: Zenia Resides, MD ?Primary Care Physician: Dickie La, MD ?Admit Date: 03/20/2021 ? ?Reason for Consultation/Follow-up: Establishing goals of care and Psychosocial/spiritual support ? ?Subjective: ?Medical records reviewed. Patient assessed at the bedside. She is in good spirits with no complaints. ? ?Continued goals of care conversation and education on chronic conditions, particularly dementia as a progressive and incurable disease. Chai was unaware of this and we reviewed the possible impact on her quality of life in the future, expected trajectory, and the importance of anticipatory care planning. Discussed her friend's experience with dementia. Discussed current care plan and possibility of discharging home in the coming days. Kira is open to possibly staying with her daughter (she initially stated that she did not want to do this.) ? ?Counseled on the risks and benefits of aggressive interventions such as CPR, mechanical ventilation. Calliegh has not considered this before. She would rather concentrate on "getting my body back in shape," learning about her medical conditions and medications changes, and overall improvement. She defers this conversation to a later date. Outpatient palliative care was explained and offered. ? ?Questions and concerns addressed. PMT will continue to support holistically. ? ?Length of Stay: 6 ? ?Current Medications: ?Scheduled Meds:  ? Chlorhexidine Gluconate Cloth  6 each Topical Daily  ? dorzolamide-timolol  1 drop Both Eyes BID  ? hydrALAZINE  25 mg Oral Q8H  ? isosorbide mononitrate  30 mg Oral Daily  ? latanoprost  1 drop Both Eyes  QHS  ? metoprolol succinate  200 mg Oral Daily  ? pantoprazole  40 mg Oral Daily  ? psyllium  1 packet Oral Daily  ? tamsulosin  0.4 mg Oral Daily  ? ? ? ?PRN Meds: ?acetaminophen, albuterol, diclofenac Sodium, metoprolol tartrate, senna ? ?Physical Exam ?Vitals and nursing note reviewed.  ?Constitutional:   ?   General: She is not in acute distress. ?   Interventions: Nasal cannula in place.  ?   Comments: Elderly, frail appearing  ?Cardiovascular:  ?   Rate and Rhythm: Normal rate.  ?Pulmonary:  ?   Effort: Pulmonary effort is normal.  ?Skin: ?   General: Skin is warm and dry.  ?  Neurological:  ?   Mental Status: She is alert.  ?Psychiatric:     ?   Mood and Affect: Mood normal.  ?         ? ?Vital Signs: BP 117/82   Pulse 93   Temp 98.2 ?F (36.8 ?C) (Oral)   Resp 18   Ht 5\' 8"  (1.727 m)   Wt 73.1 kg   SpO2 94%   BMI 24.51 kg/m?  ?SpO2: SpO2: 94 % ?O2 Device: O2 Device: Room Air ?O2 Flow Rate: O2 Flow Rate (L/min): 2 L/min ? ?Intake/output summary:  ?Intake/Output Summary (Last 24 hours) at 03/26/2021 1326 ?Last data filed at 03/26/2021 0537 ?Gross per 24 hour  ?Intake 180 ml  ?Output 650 ml  ?Net -470 ml  ? ?LBM: Last BM Date : 03/25/21 ?Baseline Weight: Weight: 79.7 kg ?Most recent weight: Weight: 73.1 kg ? ?     ?Palliative Assessment/Data: 50% ? ? ? ? ? ?Patient Active Problem List  ? Diagnosis Date Noted  ? Acute renal failure ([place])   ? Acute on chronic HFrEF (heart failure with reduced ejection fraction) ([place])   ? Delirium   ? Acute respiratory failure with hypoxia ([place])   ? Unstable angina ([place]) 07/25/2020  ? Anemia 04/25/2020  ? Anorectal disorder 04/25/2020  ? Bloating 04/25/2020  ? Diarrhea 04/25/2020  ? Diverticular disease of colon 04/25/2020  ? Gastro-esophageal reflux disease without esophagitis 04/25/2020  ? Acute congestive heart failure ([place]) 04/25/2020  ? Heartburn 04/25/2020  ? Hematochezia 04/25/2020  ? Neck pain 04/25/2020  ? Other specified symptoms and signs involving the digestive system  and abdomen 04/25/2020  ? Overactive bladder 04/25/2020  ? Personal history of colonic polyps 04/25/2020  ? Polyp of colon 04/25/2020  ? Spinal stenosis 04/25/2020  ? Weakness 04/25/2020  ? Leg pain 01/24/2020  ? Cirrhosis of liver ([place]) 06/22/2019  ? H/O right hemicolectomy 06/16/2018  ? Abnormal CT scan, liver 06/16/2018  ? Urge incontinence 01/08/2018  ? High risk social situation 06/27/2015  ? Chronic atrial fibrillation (HCC)   ? CKD (chronic kidney disease), stage III ([place]) 05/31/2015  ? Hx of Tibial plateau fracture 2017 05/22/2015  ? Goals of care, counseling/discussion 02/15/2013  ? Long term current use of anticoagulant therapy 05/03/2010  ? RBBB 02/16/2008  ? VENTRICULAR TACHYCARDIA 02/16/2008  ? History of mitral valve replacement - St Jude mechanical valve 02/16/2008  ? Hypertension 03/19/2006  ? RHINITIS, ALLERGIC 03/19/2006  ? Osteoarthrosis involving lower leg 03/19/2006  ? Osteoporosis 03/19/2006  ? ? ?Palliative Care Assessment & Plan  ? ?Patient Profile: ?86 y.o. female  with past medical history of  HFpEF, s/p mechanical mitral valve on coumadin, permanent a fib, HTN, CKD stage III, osteoporosis, history of right hemicolectomy, cirrhosis, GERDadmitted on 03/20/2021 with dyspnea on exertion and chest pain.  ?  ?Patient is in acute hypoxic respiratory failure secondary to new HFrEF, with acute renal failure on CKD stage 3a. Possible component of bladder outlet obstruction, as foley placed 3/4 with UOP of 1.7 to 5L. Patient's Cr and respiratory status are improving. PMT has been consulted to assist with goals of care conversation. ? ?Assessment: ?Goals of care conversation ?Acute on chronic combined HF ?AKI on CKD3 ? ?Recommendations/Plan: ?Continue full code/full scope treatment  ?Patient and daughter are interested in outpatient palliative care referral at discharge, appreciate TOC assistance  ?Psychosocial and emotional support provided ?PMT will continue to follow as needed ? ?Prognosis: ? Unable  to determine ? ?Discharge Planning: ?To Be  Determined ? ? ?MDM: High ? ? ?Elhadji Pecore Burt Knack, PA-C ?Palliative Medicine Team ?Team phone # [phone] ? ?Thank you for allowing the Palliative Medicine Team to assist in the care of this patient. Please utilize secure chat with additional questions, if there is no response within 30 minutes please call the above phone number. ? ?Palliative Medicine Team providers are available by phone from 7am to 7pm daily and can be reached through the team cell phone.  ?Should this patient require assistance outside of these hours, please call the patient's attending physician.  ? ? ? ? ? ?

## 2021-03-26 NOTE — Progress Notes (Addendum)
ANTICOAGULATION CONSULT NOTE - Follow Up Consult ? ?Pharmacy Consult for Warfarin>Heparin  ?Indication:  Mechanical Mitral Valve ? ?Allergies  ?Allergen Reactions  ? Esomeprazole Magnesium   ?  REACTION: stomach upset, nausea  ? ? ?Patient Measurements: ?Height: 5\' 8"  (172.7 cm) ?Weight: 73.1 kg (161 lb 3.2 oz) ?IBW/kg (Calculated) : 63.9 ?Heparin Dosing Weight: 73.1 kg  ? ?Vital Signs: ?Temp: 98.2 ?F (36.8 ?C) (03/07 11-23-2001) ?Temp Source: Oral (03/07 11-23-2001) ?BP: 169/121 (03/07 0925) ?Pulse Rate: 130 (03/07 0925) ? ?Labs: ?Recent Labs  ?  03/24/21 ?0109 03/25/21 ?0307 03/26/21 ?0309  ?HGB 11.3* 10.6* 11.8*  ?HCT 37.1 35.1* 39.3  ?PLT 203 183 182  ?LABPROT 41.2* 37.8* 27.5*  ?INR 4.3* 3.9* 2.6*  ?CREATININE 4.34* 2.86* 2.11*  ? ? ? ?Estimated Creatinine Clearance: 20.4 mL/min (A) (by C-G formula based on SCr of 2.11 mg/dL (H)). ? ? ?Medical History: ?Past Medical History:  ?Diagnosis Date  ? Asthma   ? years ago  ? Chronic atrial fibrillation (HCC)   ? Dilated cardiomyopathy (HCC)   ? history of this, now resolved  ? Dysphagia   ? Hx  ? Fracture of tibial plateau 05/21/2015  ? GERD (gastroesophageal reflux disease)   ? GI bleed   ? Hemorrhoid 04/2014  ? bleeding  ? History of blood transfusion   ? Hypertension   ? Microcytic anemia   ? Nonsustained ventricular tachycardia   ? Osteoporosis   ? Protein in urine   ? RBBB (right bundle branch block)   ? Rheumatic mitral valve disease   ? ? ?Medications:  ?Scheduled:  ? Chlorhexidine Gluconate Cloth  6 each Topical Daily  ? dorzolamide-timolol  1 drop Both Eyes BID  ? hydrALAZINE  25 mg Oral Q8H  ? isosorbide mononitrate  30 mg Oral Daily  ? latanoprost  1 drop Both Eyes QHS  ? metoprolol succinate  200 mg Oral Daily  ? pantoprazole  40 mg Oral Daily  ? psyllium  1 packet Oral Daily  ? tamsulosin  0.4 mg Oral Daily  ? ? ?Assessment: ?86 yo F with a history of mechanical mitral valve replacement, on long-term anticoagulation with warfarin.  PTA warfarin dosing: 5 mg  Sun/Tues/Thurs, 10 mg all other days. Holding warfarin for potential heart catheterization later this week. Pharmacy consulted for heparin dosing.  ? ?INR therapeutic 3/7 at 2.6 with last dose of warfarin 3/1. Patient INR has been elevated for past several days but expect it will begin to decrease based on drastic decrease overnight (INR 3.9>2.6). CBC stable. No signs/symptoms of bleeding noted.   ? ?Goal of Therapy:  ?INR 2.5-3.5 , heparin level 0.3-0.7  ?Monitor platelets by anticoagulation protocol: Yes ?  ?Plan:  ?Start Hep gtt when INR<2.5   ?Daily CBC, INR (until INR <2.5)  ?Monitor for signs/symptoms of bleeding.  ?Continue to hold Warfarin per cardiology  ? ?ADDENDUM: ?Start heparin gtt in anticipation of continued decreases in INR overnight based on rapid decrease in INR over last 24 hours (INR 3.9>2.6).  ? ?Start heparin at 1,000 units/hour (no bolus)  ?Check 6 hour heparin level and daily ?Daily CBC, monitor signs/symptoms of bleeding  ? ?5/7, PharmD ?PGY-1 Acute Care Resident  ?03/26/2021 10:24 AM  ? ? ? ? ? ?

## 2021-03-26 NOTE — Progress Notes (Signed)
? ?Progress Note ? ?[name] ?Date of Encounter: 03/26/2021 ? ?[facility] Cardiologist: Cristopher Peru, MD  ? ?Subjective  ? ?Feeling better. Breathing improving. Ambulating the hallway. ? ?Cr continuing to downtrend to 2.11 ?Wt 166>161 ?HR elevated 120-130s with ambulation ? ?Inpatient Medications  ?  ?Scheduled Meds: ? Chlorhexidine Gluconate Cloth  6 each Topical Daily  ? dorzolamide-timolol  1 drop Both Eyes BID  ? hydrALAZINE  25 mg Oral Q8H  ? isosorbide mononitrate  30 mg Oral Daily  ? latanoprost  1 drop Both Eyes QHS  ? metoprolol succinate  200 mg Oral Daily  ? pantoprazole  40 mg Oral Daily  ? psyllium  1 packet Oral Daily  ? tamsulosin  0.4 mg Oral Daily  ? ?Continuous Infusions: ? ?PRN Meds: ?acetaminophen, albuterol, diclofenac Sodium, senna  ? ?Vital Signs  ?  ?Vitals:  ? 03/26/21 [id number] 03/26/21 0538 03/26/21 0925 03/26/21 1117  ?BP: (!) 141/91  (!) 169/121 117/82  ?Pulse: 97  (!) 130 93  ?Resp: 18     ?Temp: 98.2 ?F (36.8 ?C)     ?TempSrc: Oral     ?SpO2: 100%  94%   ?Weight:  73.1 kg    ?Height:      ? ? ?Intake/Output Summary (Last 24 hours) at 03/26/2021 1134 ?Last data filed at 03/26/2021 0537 ?Gross per 24 hour  ?Intake 180 ml  ?Output 650 ml  ?Net -470 ml  ? ? ?Last 3 Weights 03/26/2021 03/25/2021 03/24/2021  ?Weight (lbs) 161 lb 3.2 oz 166 lb 7.2 oz 173 lb 4.5 oz  ?Weight (kg) 73.12 kg 75.5 kg 78.6 kg  ?   ? ?Telemetry  ?  ?Afib with episodes of RVR up to 130s- Personally Reviewed ? ?ECG  ?  ?No new tracing today ? ?Physical Exam  ? ?GEN: No acute distress.  Comfortable ?Neck: No JVD ?Cardiac: Irregular, tachycardic, +mechanical click ?Respiratory: Clear to auscultation bilaterally. ?GI: Soft, nontender, non-distended  ?MS: No edema; No deformity.  ?Neuro:  Nonfocal  ?Psych: Normal affect  ? ?Labs  ?  ?High Sensitivity Troponin:   ?Recent Labs  ?Lab 03/20/21 ?1340 03/20/21 ?1647  ?TROPONINIHS 14 14  ? ?   ?Chemistry ?Recent Labs  ?Lab 03/21/21 ?[id number] 03/22/21 ?[id number] 03/23/21 ?[id number]  03/24/21 ?0109 03/25/21 ?[id number] 03/26/21 ?0309  ?NA 140 139 139 138 140 140  ?K 4.7 4.1 4.1 3.5 3.5 3.7  ?CL 105 105 104 103 107 105  ?CO2 22 24 22 24 24 25   ?GLUCOSE 116* 113* 114* 110* 97 99  ?BUN 23 37* 59* 63* 56* 48*  ?CREATININE 1.85* 2.87* 5.37* 4.34* 2.86* 2.11*  ?CALCIUM 9.6 9.1 9.3 9.1 9.0 9.3  ?MG 1.8 2.4 2.4  --   --   --   ?PROT  --   --   --   --  6.6  --   ?ALBUMIN  --   --   --  3.2* 2.9*  --   ?AST  --   --   --   --  18  --   ?ALT  --   --   --   --  15  --   ?ALKPHOS  --   --   --   --  38  --   ?BILITOT  --   --   --   --  0.9  --   ?GFRNONAA 27* 16* 7* 10* 16* 23*  ?ANIONGAP 13 10 13 11 9 10   ? ?  ?  Lipids No results for input(s): CHOL, TRIG, HDL, LABVLDL, LDLCALC, CHOLHDL in the last 168 hours.  ?Hematology ?Recent Labs  ?Lab 03/24/21 ?0109 03/25/21 ?0307 03/26/21 ?0309  ?WBC 7.7 7.4 7.4  ?RBC 4.21 3.94 4.39  ?HGB 11.3* 10.6* 11.8*  ?HCT 37.1 35.1* 39.3  ?MCV 88.1 89.1 89.5  ?MCH 26.8 26.9 26.9  ?MCHC 30.5 30.2 30.0  ?RDW 16.0* 15.8* 15.6*  ?PLT 203 183 182  ? ? ?Thyroid No results for input(s): TSH, FREET4 in the last 168 hours.  ?BNP ?Recent Labs  ?Lab 03/20/21 ?1340 03/22/21 ?0612  ?BNP 1,633.6* 1,374.9*  ? ?  ?DDimer No results for input(s): DDIMER in the last 168 hours.  ? ?Radiology  ?  ?No results found. ? ?Cardiac Studies  ? ?TTE 03/21/21: ?IMPRESSIONS  ? 1. Left ventricular ejection fraction, by estimation, is 35 to 40%. The  ?left ventricle has moderately decreased function. The left ventricle  ?demonstrates global hypokinesis. There is moderate left ventricular  ?hypertrophy. Left ventricular diastolic  ?function could not be evaluated.  ? 2. Right ventricular systolic function is mildly reduced. The right  ?ventricular size is normal.  ? 3. Left atrial size was Massively dilated at 126 ml/m2.  ? 4. Right atrial size was severely dilated.  ? 5. The mitral valve has been repaired/replaced. Trivial mitral valve  ?regurgitation. No evidence of mitral stenosis. There is a St. Jude   ?mechanical valve present in the mitral position. Procedure Date: 75.  ?Echo findings are consistent with normal  ?structure and function of the mitral valve prosthesis.  ? 6. The aortic valve is calcified. Aortic valve regurgitation is not  ?visualized. Mild to moderate aortic valve stenosis. Aortic valve area, by  ?VTI measures 1.48 cm?Marland Kitchen Aortic valve mean gradient measures 8.0 mmHg.  ?Aortic valve Vmax measures 1.94 m/s.  ? ?Comparison(s): Changes from prior study are noted. 07/25/2020: LVEF 50-55%. ? ?Patient Profile  ?   ?86 y.o. female with PMH of chronic diastolic CHF, chronic atrial fibrillation on coumadin, St Jude mechanical MVR 1987, mild AS, NSVT, RBBB, HTN, CKD stage III, GERD and chronic anemia who presented on 3/1 with worsening dyspnea for 3 month. BNP elevated at 1633 ? ?Assessment & Plan  ?  ?#Acute on chronic combined heart failure: ?Newly diagnosed on this admission. TTE 03/21/21 with LVEF 35-40%, global hypokinesis, moderate LVH, mild RV systolic dysfunction, severe biatrial enlargement, mechanical MVR with normal function, mild-to-moderate AS. Lasix held due to worsening renal function with concern for possible obstruction given brisk UOP after foley placement. No hydronephrosis on ultrasound.  Renal following ?- Diuretic management per renal ?- Continue imdur 30mg  daily ?- Continue hydralazine 25mg  q8h ?-Continue metop 200mg  XL daily ?- Cannot add ACE/ARNI/ARB or spiro due to renal dysfunction; will add as able ?- Plan for possible RHC/LHC pending renal recovery (hopefully Thursday) ?- Monitor I/Os and daily weights ?- Low Na diet ? ?#ARF CKD IIIa ?More rapid than would have been expected than with diuresis alone. Suspect element of bladder outlet obstruction given brisk diuresis after foley placement. Renal ultrasound with no hydronephrosis. Now improving. ?- Appreciate renal recommendations ?- Diuresis held ? ?#Saint Jude mechanical mitral valve 1987: ?TTE with normal functioning mechanical  valve. ?- Routine follow-up as outpatient ?- Warfarin held given plans for possible LHC/RHC ?- Continue heparin gtt for now ? ?#Persistent atrial fibrillation: ?Elevated today with HR up to 130s. ?- Continue metop 200mg  XL daily ?-Will start metop tartrate 25mg  TID prn for HR>120 ?- Warfarin held given  plans for possible LHC/RHC ?- Continue heparin gtt for now ? ?#Mild-to-moderate AS: ?-Will need routine follow-up as out-patient ? ?#HTN: ?- Continue imdur 30mg  daily ?- Continue hydralazine 25mg  q8h ?- Continue metop 200mg  XL daily ? ?For questions or updates, please contact [place] ?Please consult [email or web address] for contact info under  ? ?  ?   ?Signed, ?Freada Bergeron, MD  ?03/26/2021, 11:34 AM   ? ?

## 2021-03-26 NOTE — Progress Notes (Signed)
?  Mobility Specialist Criteria Algorithm Info. ? ? 03/26/21 1210  ?Oxygen Therapy  ?SpO2 95 %  ?O2 Device Room Air  ?Patient Activity (if Appropriate) Ambulating  ?Pulse Oximetry Type Continuous  ?Mobility  ?Activity Ambulated with assistance in hallway  ?Range of Motion/Exercises Active;All extremities  ?Level of Assistance Standby assist, set-up cues, supervision of patient - no hands on  ?Assistive Device Front wheel walker  ?Distance Ambulated (ft) 240 ft  ?Activity Response Tolerated well  ? ?Patient received in bed, agreeable to participate w/encouragement. Ambulated in hallway supervision level with slow gait. HR elevated 130-140's during ambulation and SaO2 maintained >90% on RA during session. Required extended stand rest recovery. Returned to room without complaint or incident. Was left lying supine in bed with all needs met, call bell in reach. ? ?03/26/2021 ?12:10 PM ? ?[name], CMS, BS EXP ?Acute Rehabilitation Services  ?WGNFA:[phone] ?Office: [phone] ? ?

## 2021-03-26 NOTE — Progress Notes (Signed)
FPTS Interim Night Progress Note ? ?S:Patient sleeping comfortably.  Rounded with primary night RN.  No concerns voiced.  No orders required.   ? ?O: ?Today's Vitals  ? 03/25/21 2041 03/25/21 2200 03/26/21 0338 03/26/21 0538  ?BP: 130/85  (!) 141/91   ?Pulse: 84  97   ?Resp: 20  18   ?Temp: 97.6 ?F (36.4 ?C)  98.2 ?F (36.8 ?C)   ?TempSrc: Oral  Oral   ?SpO2: 100%  100%   ?Weight:    73.1 kg  ?Height:      ?PainSc:  0-No pain    ? ? ? ? ?A/P: ?Continue current management ? ?Dana Allan MD ?PGY-3, [facility] ?Service pager [phone]   ?

## 2021-03-26 NOTE — Evaluation (Signed)
Physical Therapy Evaluation ?Patient Details ?Name: [name] ?MRN: [id number] ?DOB: 11-27-1935 ?Today's Date: 03/26/2021 ? ?History of Present Illness ? 86 yo admitted 3/1 with DOE and CP. Pt with acute hypoxic respiratory failure secondary to new HFrEF, with acute renal failure.  PMHx:HFpEF, s/p MVR, permanent Afib, HTN, CKD stage III, osteoporosis, Rt hemicolectomy, cirrhosis, GERD  ?Clinical Impression ? Pt pleasant and normally lives alone in senior apartment but reports she could stay with her daughter who does not work. Pt encouraged to have assist for transfers and mobility at present to prevent falls. Pt with decreased strength, function and cardiopulmonary function who will benefit from acute therapy to maximize independence and function.    ? ?HR 130-150 Afib with gait ?BP post gait 169/121 ?   ? ?Recommendations for follow up therapy are one component of a multi-disciplinary discharge planning process, led by the attending physician.  Recommendations may be updated based on patient status, additional functional criteria and insurance authorization. ? ?Follow Up Recommendations Home health PT at daughter's house ? ?  ?Assistance Recommended at Discharge Intermittent Supervision/Assistance  ?Patient can return home with the following ? Assistance with cooking/housework;Assist for transportation;A little help with walking and/or transfers ? ?  ?Equipment Recommendations None recommended by PT  ?Recommendations for Other Services ?    ?  ?Functional Status Assessment Patient has had a recent decline in their functional status and demonstrates the ability to make significant improvements in function in a reasonable and predictable amount of time.  ? ?  ?Precautions / Restrictions Precautions ?Precautions: Fall ?Precaution Comments: watch HR  ? ?  ? ?Mobility ? Bed Mobility ?Overal bed mobility: Modified Independent ?  ?  ?  ?  ?  ?  ?  ?  ? ?Transfers ?Overall transfer level: Needs assistance ?  ?Transfers:  Sit to/from Stand ?Sit to Stand: Min assist ?  ?  ?  ?  ?  ?General transfer comment: min assist to rise from surface ?  ? ?Ambulation/Gait ?Ambulation/Gait assistance: Min guard ?Gait Distance (Feet): 90 Feet ?Assistive device: Rolling walker (2 wheels) ?Gait Pattern/deviations: Step-through pattern, Decreased stride length ?  ?Gait velocity interpretation: <1.8 ft/sec, indicate of risk for recurrent falls ?  ?General Gait Details: rounded shoulders with cues for direction and gait limited by HR 130-150 Afib ? ?Stairs ?  ?  ?  ?  ?  ? ?Wheelchair Mobility ?  ? ?Modified Rankin (Stroke Patients Only) ?  ? ?  ? ?Balance Overall balance assessment: Needs assistance ?Sitting-balance support: No upper extremity supported ?Sitting balance-Leahy Scale: Good ?  ?  ?Standing balance support: Bilateral upper extremity supported ?Standing balance-Leahy Scale: Poor ?Standing balance comment: reliant on bil UE support for gait ?  ?  ?  ?  ?  ?  ?  ?  ?  ?  ?  ?   ? ? ? ?Pertinent Vitals/Pain Pain Assessment ?Pain Assessment: No/denies pain  ? ? ?Home Living Family/patient expects to be discharged to:: Private residence ?Living Arrangements: Alone ?Available Help at Discharge: Family;Available PRN/intermittently ?Type of Home: Apartment ?Home Access: Level entry ?  ?  ?  ?Home Layout: One level ?Home Equipment: Conservation officer, nature (2 wheels);Cane - single point;Shower seat;Grab bars - tub/shower ?   ?  ?Prior Function Prior Level of Function : Independent/Modified Independent ?  ?  ?  ?  ?  ?  ?Mobility Comments: furniture walks in apartment, cane in community ?ADLs Comments: daughter takes her to the grocery store,  pt does her own cooking ?  ? ? ?Hand Dominance  ?   ? ?  ?Extremity/Trunk Assessment  ? Upper Extremity Assessment ?Upper Extremity Assessment: Generalized weakness ?  ? ?Lower Extremity Assessment ?Lower Extremity Assessment: Generalized weakness ?  ? ?Cervical / Trunk Assessment ?Cervical / Trunk Assessment: Normal   ?Communication  ? Communication: HOH  ?Cognition Arousal/Alertness: Awake/alert ?Behavior During Therapy: [facility] for tasks assessed/performed ?Overall Cognitive Status: Impaired/Different from baseline ?Area of Impairment: Memory ?  ?  ?  ?  ?  ?  ?  ?  ?  ?  ?Memory: Decreased short-term memory ?  ?  ?  ?  ?  ?  ?  ? ?  ?General Comments   ? ?  ?Exercises    ? ?Assessment/Plan  ?  ?PT Assessment Patient needs continued PT services  ?PT Problem List Decreased strength;Decreased mobility;Decreased safety awareness;Decreased activity tolerance;Cardiopulmonary status limiting activity;Decreased balance;Decreased knowledge of use of DME ? ?   ?  ?PT Treatment Interventions Gait training;Balance training;Functional mobility training;Therapeutic activities;Patient/family education;Therapeutic exercise;DME instruction   ? ?PT Goals (Current goals can be found in the Care Plan section)  ?Acute Rehab PT Goals ?Patient Stated Goal: return home ?PT Goal Formulation: With patient ?Time For Goal Achievement: 04/09/21 ?Potential to Achieve Goals: Fair ? ?  ?Frequency Min 3X/week ?  ? ? ?Co-evaluation   ?  ?  ?  ?  ? ? ?  ?AM-PAC PT "6 Clicks" Mobility  ?Outcome Measure Help needed turning from your back to your side while in a flat bed without using bedrails?: None ?Help needed moving from lying on your back to sitting on the side of a flat bed without using bedrails?: None ?Help needed moving to and from a bed to a chair (including a wheelchair)?: A Little ?Help needed standing up from a chair using your arms (e.g., wheelchair or bedside chair)?: A Little ?Help needed to walk in hospital room?: A Little ?Help needed climbing 3-5 steps with a railing? : A Lot ?6 Click Score: 19 ? ?  ?End of Session   ?Activity Tolerance: Patient tolerated treatment well ?Patient left: in chair;with call bell/phone within reach;with chair alarm set ?Nurse Communication: Mobility status ?PT Visit Diagnosis: Other abnormalities of gait and mobility  (R26.89);Muscle weakness (generalized) (M62.81) ?  ? ?Time: [id number] ?PT Time Calculation (min) (ACUTE ONLY): 26 min ? ? ?Charges:   PT Evaluation ?$PT Eval Moderate Complexity: 1 Mod ?PT Treatments ?$Gait Training: 8-22 mins ?  ?   ? ? ?Jeanita Carneiro P, PT ?Acute Rehabilitation Services ?Pager: [phone] ?Office: [phone] ? ? ?Khing Belcher B Brendalee Matthies ?03/26/2021, 9:29 AM ? ?

## 2021-03-26 NOTE — Progress Notes (Signed)
Family Medicine Teaching Service ?Daily Progress Note ?Intern Pager: [phone] ? ?[name] Medical record number: [id number] ?Date of birth: 02-20-1935 Age: 86 y.o. Gender: female ? ?Primary Care Provider: Dickie La, MD ?Consultants: Cardiology, nephrology, palliative care ?Code Status: Full ? ?Pt Overview and Major Events to Date:  ?02/20/2021-admitted ?02/21/2021-ARF ?03/23/2021-BOO and Foley placed ? ?Assessment and Plan: ?Patient is an 86 year old female who presented with dyspnea on exertion and chest pain.  PMH significant for HFpEF (now HFrEF), s/p Canna: Mitral valve on Coumadin, permanent A-fib, HTN, CKD stage III, osteoporosis, history of right hemicolectomy, GERD ? ?Acute hypoxic respiratory failure secondary to new HFrEF, improving ?Patient breathing comfortably on room air this morning with oxygen saturations in high 90s.  Patient proceeded to self diurese after Foley placed with improving renal studies and good UOP yesterday. 24 hr UOP decreased to 640mL today.  Weight is down to 73.1 kg, from 75.5 kg yesterday and 79.7 since admission. ?-Cardiology consulted, appreciate recs ?-Metoprolol succinate 200 mg daily ?-Continue home Imdur ?-Strict I's and O's and daily weights ?-A.m. BMP ?-Palliative care consulted ? ?Acute renal failure on CKD stage III ?Patient received 1 dose of IV Lasix 40 mg IV on 03/22/2021 followed by ARF (creatinine 5.37, GFR 7 on 3/4).  Nephrology states the stress of change is likely due to be caused by Lasix and there appeared to be a component of bladder outlet obstruction.  Foley was placed on 3/4 with UOP of 1.85 L.  Ultrasound ?Showed no hydronephrosis.  Creatinine continues to improve, 2.86>2.11, as well as GFR 16>23.  Patient was started on tamsulosin 0.4 mg daily yesterday.  Plan to remove Foley today ?-Remove Foley ?-post void bladder scans ?-Strict I's and O's ?-Daily BMP ?-Nephrology has signed off ?-Avoid nephrotoxins ? ?S/p mechanical mitral valve   persistent A-fib ?Anticoagulation has been held due to supratherapeutic INR levels.  INR has come down today to 2.6.  Pharmacy is on board to dose heparin rather than warfarin d/t possibility of right and left heart cath.  Yesterday metoprolol succinate was increased from 100 mg daily to 200 mg daily due to patient's heart rate elevated into the 1 teens/120s.  Heart rate today is in the low 100s-1 teens. Will add metoprolol tartrate 25mg  TID prn for HR >120 per cards ?-Cardiology following ?-Metoprolol succinate 200 mg daily ?-metoprolol tartrate 25mg  TID prn for HR >120  ?-Heparin per pharmacy ? ?Hypertension ?BP this morning of 155/81 ?-Continue Imdur 30 mg daily ?-Hydralazine 25 mg every 8 hours ?-Metoprolol succinate 200 mg daily ? ?Delirium, mild cognitive disorder, resolved ?Patient is alert and oriented today ? ?GERD ?-Continue home pantoprazole 40 mg daily ? ?Prolonged QTc ?QTc slightly elevated to 484 on admission ?-Judiciously use QTc prolonging medications ? ?FEN/GI: Heart healthy diet ?PPx: Plan for heparin when INR equal < 2 ?Dispo: Pending continued medical management ? ?Subjective:  ?Patient states she is doing well this morning has no complaints at this time.  Denies any shortness of breath.   ? ?Objective: ?Temp:  [97.6 ?F (36.4 ?C)-98.2 ?F (36.8 ?C)] 98.2 ?F (36.8 ?C) (03/07 [id number]) ?Pulse Rate:  [84-117] 97 (03/07 0338) ?Resp:  [18-20] 18 (03/07 0338) ?BP: (110-141)/(85-91) 141/91 (03/07 0338) ?SpO2:  [98 %-100 %] 100 % (03/07 0338) ?Weight:  [73.1 kg] 73.1 kg (03/07 0538) ?Physical Exam: ?General: Patient lying in bed, pleasant to speak with, NAD ?Cardiovascular: Irregularly irregular ?Respiratory: CTA B, normal effort ?Abdomen: Soft, nontender to palpation, nondistended ?Extremities: No edema BLEs ? ?  Laboratory: ?Recent Labs  ?Lab 03/24/21 ?0109 03/25/21 ?0307 03/26/21 ?0309  ?WBC 7.7 7.4 7.4  ?HGB 11.3* 10.6* 11.8*  ?HCT 37.1 35.1* 39.3  ?PLT 203 183 182  ? ?Recent Labs  ?Lab 03/24/21 ?0109  03/25/21 ?0307 03/26/21 ?0309  ?NA 138 140 140  ?K 3.5 3.5 3.7  ?CL 103 107 105  ?CO2 24 24 25   ?BUN 63* 56* 48*  ?CREATININE 4.34* 2.86* 2.11*  ?CALCIUM 9.1 9.0 9.3  ?PROT  --  6.6  --   ?BILITOT  --  0.9  --   ?ALKPHOS  --  38  --   ?ALT  --  15  --   ?AST  --  18  --   ?GLUCOSE 110* 97 99  ? ? ? ? ?Precious Gilding, DO ?03/26/2021, 7:25 AM ?PGY-1, [place] ?[place] Intern pager: [phone], text pages welcome ? ?

## 2021-03-26 NOTE — Plan of Care (Signed)
?  Problem: Cardiac: ?Goal: Ability to achieve and maintain adequate cardiopulmonary perfusion will improve ?Outcome: Progressing ?  ?Problem: Elimination: ?Goal: Will not experience complications related to bowel motility ?Outcome: Progressing ?Goal: Will not experience complications related to urinary retention ?Outcome: Progressing ?  ?Problem: Pain Managment: ?Goal: General experience of comfort will improve ?Outcome: Progressing ?  ?

## 2021-03-27 LAB — URINALYSIS, ROUTINE W REFLEX MICROSCOPIC
Bilirubin Urine: NEGATIVE
Glucose, UA: NEGATIVE mg/dL
Ketones, ur: NEGATIVE mg/dL
Nitrite: NEGATIVE
Protein, ur: 100 mg/dL — AB
Specific Gravity, Urine: 1.012 (ref 1.005–1.030)
WBC, UA: >50 WBC/hpf — ABNORMAL HIGH (ref 0–5)
pH: 9 — ABNORMAL HIGH (ref 5.0–8.0)

## 2021-03-27 LAB — CBC
HCT: 40.4 % (ref 36.0–46.0)
Hemoglobin: 12.3 g/dL (ref 12.0–15.0)
MCH: 27.1 pg (ref 26.0–34.0)
MCHC: 30.4 g/dL (ref 30.0–36.0)
MCV: 89 fL (ref 80.0–100.0)
Platelets: 201 10*3/uL (ref 150–400)
RBC: 4.54 MIL/uL (ref 3.87–5.11)
RDW: 15.5 % (ref 11.5–15.5)
WBC: 8.5 10*3/uL (ref 4.0–10.5)
nRBC: 0 % (ref 0.0–0.2)

## 2021-03-27 LAB — BASIC METABOLIC PANEL
Anion gap: 11 (ref 5–15)
BUN: 37 mg/dL — ABNORMAL HIGH (ref 8–23)
CO2: 24 mmol/L (ref 22–32)
Calcium: 9.3 mg/dL (ref 8.9–10.3)
Chloride: 105 mmol/L (ref 98–111)
Creatinine, Ser: 1.87 mg/dL — ABNORMAL HIGH (ref 0.44–1.00)
GFR, Estimated: 26 mL/min — ABNORMAL LOW (ref >60)
Glucose, Bld: 109 mg/dL — ABNORMAL HIGH (ref 70–99)
Potassium: 4.6 mmol/L (ref 3.5–5.1)
Sodium: 140 mmol/L (ref 135–145)

## 2021-03-27 LAB — HEPARIN LEVEL (UNFRACTIONATED)
Heparin Unfractionated: 0.12 IU/mL — ABNORMAL LOW (ref 0.30–0.70)
Heparin Unfractionated: 0.71 IU/mL — ABNORMAL HIGH (ref 0.30–0.70)
Heparin Unfractionated: 0.65 IU/mL (ref 0.30–0.70)

## 2021-03-27 LAB — PROTIME-INR
INR: 1.9 — ABNORMAL HIGH (ref 0.8–1.2)
Prothrombin Time: 21.7 seconds — ABNORMAL HIGH (ref 11.4–15.2)

## 2021-03-27 MED ORDER — ASPIRIN 81 MG PO CHEW
81.0000 mg | CHEWABLE_TABLET | ORAL | Status: AC
  Administered 2021-03-28: 06:00:00 81 mg via ORAL
  Filled 2021-03-27: qty 1

## 2021-03-27 MED ORDER — SODIUM CHLORIDE 0.9% FLUSH
3.0000 mL | Freq: Two times a day (BID) | INTRAVENOUS | Status: DC
  Administered 2021-03-28 – 2021-04-01 (×6): 3 mL via INTRAVENOUS

## 2021-03-27 MED ORDER — SODIUM CHLORIDE 0.9% FLUSH: 3.0000 mL | INTRAVENOUS | Status: DC | PRN

## 2021-03-27 MED ORDER — HYDRALAZINE HCL 50 MG PO TABS
50.0000 mg | ORAL_TABLET | Freq: Three times a day (TID) | ORAL | Status: DC
Start: 2021-03-27 — End: 2021-03-28
  Administered 2021-03-27: 50 mg via ORAL
  Filled 2021-03-27 (×2): qty 1

## 2021-03-27 MED ORDER — DIPHENHYDRAMINE HCL 25 MG PO CAPS
50.0000 mg | ORAL_CAPSULE | Freq: Once | ORAL | Status: AC
  Administered 2021-03-28: 06:00:00 50 mg via ORAL
  Filled 2021-03-27: qty 2

## 2021-03-27 MED ORDER — AMIODARONE HCL 200 MG PO TABS
400.0000 mg | ORAL_TABLET | Freq: Two times a day (BID) | ORAL | Status: DC
  Administered 2021-03-27: 400 mg via ORAL
  Filled 2021-03-27 (×2): qty 2

## 2021-03-27 MED ORDER — SODIUM CHLORIDE 0.9 % IV SOLN: INTRAVENOUS | Status: DC

## 2021-03-27 MED ORDER — AMIODARONE HCL 200 MG PO TABS: 200.0000 mg | ORAL_TABLET | Freq: Two times a day (BID) | ORAL | Status: DC

## 2021-03-27 MED ORDER — SODIUM CHLORIDE 0.9 % IV SOLN: 250.0000 mL | INTRAVENOUS | Status: DC | PRN

## 2021-03-27 MED ORDER — PREDNISONE 20 MG PO TABS
50.0000 mg | ORAL_TABLET | Freq: Four times a day (QID) | ORAL | Status: AC
  Administered 2021-03-27 – 2021-03-28 (×3): 50 mg via ORAL
  Filled 2021-03-27 (×3): qty 1

## 2021-03-27 MED ORDER — CHLORHEXIDINE GLUCONATE CLOTH 2 % EX PADS
6.0000 | MEDICATED_PAD | Freq: Every day | CUTANEOUS | Status: DC
  Administered 2021-03-28 – 2021-03-29 (×2): 6 via TOPICAL

## 2021-03-27 MED ORDER — DIPHENHYDRAMINE HCL 50 MG/ML IJ SOLN: 50.0000 mg | Freq: Once | INTRAMUSCULAR | Status: AC

## 2021-03-27 NOTE — TOC Initial Note (Addendum)
Transition of Care (TOC) - Initial/Assessment Note  ? ? ?Patient Details  ?Name: [name] ?MRN: [id number] ?Date of Birth: 04-13-1935 ? ?Transition of Care (TOC) CM/SW Contact:    ?[name], Ocie Cornfield, RN ?Phone Number: ?03/27/2021, 12:30 PM ? ?Clinical Narrative:  Case Manager spoke with patient regarding disposition needs. Patient states she was tired and she gave Case Manager verbal permission to call her daughter [name]. Case Manager called [name] regarding disposition needs. PTA patient was from home alone in an apartment. Patients daughter states they will aide in supervision once the patient returns home. Patient has durable medical equipment cane in the home. Case Manager discussed [place] via the Medicare.gov list and the daughter is agreeable to [facility]- unable to accept the patient due to staffing and [facility]. Referral submitted to [facility] and [facility] can accept the patient-start of care to begin within 24-48 hours post transition home. Case Manager discussed outpatient palliative services and [name] is agreeable to [facility]. Referral submitted to [facility] and the agency will follow the patient in the hospital until she returns home. Case Manager will continue to follow for additional disposition needs.         ? ?Patient will need home health PT/OT orders and F2F once stable.  ? ?Expected Discharge Plan: [place] ?Barriers to Discharge: Continued Medical Work up ? ? ?Patient Goals and CMS Choice ?Patient states their goals for this hospitalization and ongoing recovery are:: patient wants to return home and not be so tired. ?CMS Medicare.gov Compare Post Acute Care list provided to:: Patient ?Choice offered to / list presented to : Adult Children ? ?Expected Discharge Plan and Services ?Expected Discharge Plan: [place] ?  ?Discharge Planning Services: CM Consult ?Post Acute Care Choice: Home Health ?Living arrangements for the past 2  months: Apartment ?                ?DME Arranged: N/A ?DME Agency: NA ?  ?  ?  ?HH Arranged: PT, OT ?[place] Agency: [place] ?Date HH Agency Contacted: 03/27/21 ?Time [place]: [id number] spoke with at [place]: Tommi Rumps ? ?Prior Living Arrangements/Services ?Living arrangements for the past 2 months: Apartment ?Lives with:: Self ?Patient language and need for interpreter reviewed:: Yes ?Do you feel safe going back to the place where you live?: Yes      ?Need for Family Participation in Patient Care: Yes (Comment) ?Care giver support system in place?: Yes (comment) ?Current home services: DME (patient has a cane) ?Criminal Activity/Legal Involvement Pertinent to Current Situation/Hospitalization: No - Comment as needed ? ?Activities of Daily Living ?  ?ADL Screening (condition at time of admission) ?Is the patient deaf or have difficulty hearing?: Yes ?Does the patient have difficulty seeing, even when wearing glasses/contacts?: No ?Does the patient have difficulty concentrating, remembering, or making decisions?: No ?Does the patient have difficulty dressing or bathing?: No ?Does the patient have difficulty walking or climbing stairs?: Yes ? ?Permission Sought/Granted ?Permission sought to share information with : Family Supports, Customer service manager, Case Manager ?Permission granted to share information with : Yes, Verbal Permission Granted ?   ? Permission granted to share info w AGENCY: [facility]. ?   ?   ? ?Emotional Assessment ?Appearance:: Appears stated age ?Attitude/Demeanor/Rapport: Lethargic ?Affect (typically observed): Appropriate ?Orientation: : Oriented to Self, Oriented to Place, Oriented to  Time, Oriented to Situation ?Alcohol / Substance Use: Not Applicable ?  Psych Involvement: No (comment) ? ?Admission diagnosis:  Heart failure ([place]) [I50.9] ?Abdominal pain [R10.9] ?Acute congestive heart failure, unspecified heart failure type ([place])  [I50.9] ?Patient Active Problem List  ? Diagnosis Date Noted  ? Acute renal failure ([place])   ? Acute on chronic HFrEF (heart failure with reduced ejection fraction) ([place])   ? Delirium   ? Acute respiratory failure with hypoxia ([place])   ? Unstable angina ([place]) 07/25/2020  ? Anemia 04/25/2020  ? Anorectal disorder 04/25/2020  ? Bloating 04/25/2020  ? Diarrhea 04/25/2020  ? Diverticular disease of colon 04/25/2020  ? Gastro-esophageal reflux disease without esophagitis 04/25/2020  ? Acute congestive heart failure ([place]) 04/25/2020  ? Heartburn 04/25/2020  ? Hematochezia 04/25/2020  ? Neck pain 04/25/2020  ? Other specified symptoms and signs involving the digestive system and abdomen 04/25/2020  ? Overactive bladder 04/25/2020  ? Personal history of colonic polyps 04/25/2020  ? Polyp of colon 04/25/2020  ? Spinal stenosis 04/25/2020  ? Weakness 04/25/2020  ? Leg pain 01/24/2020  ? Cirrhosis of liver ([place]) 06/22/2019  ? H/O right hemicolectomy 06/16/2018  ? Abnormal CT scan, liver 06/16/2018  ? Urge incontinence 01/08/2018  ? High risk social situation 06/27/2015  ? Chronic atrial fibrillation (HCC)   ? CKD (chronic kidney disease), stage III ([place]) 05/31/2015  ? Hx of Tibial plateau fracture 2017 05/22/2015  ? Goals of care, counseling/discussion 02/15/2013  ? Long term current use of anticoagulant therapy 05/03/2010  ? RBBB 02/16/2008  ? VENTRICULAR TACHYCARDIA 02/16/2008  ? History of mitral valve replacement - St Jude mechanical valve 02/16/2008  ? Hypertension 03/19/2006  ? Atrial fibrillation with RVR ([place]) 03/19/2006  ? RHINITIS, ALLERGIC 03/19/2006  ? Osteoarthrosis involving lower leg 03/19/2006  ? Osteoporosis 03/19/2006  ? ?PCP:  Dickie La, MD ?Pharmacy:   ?[facility] [id number] - [place], [place] [address] ?[place] ?[place] [address] ?Phone: [phone] Fax: [phone] ? ? ?Readmission Risk Interventions ?No flowsheet data  found. ? ? ?

## 2021-03-27 NOTE — Progress Notes (Signed)
Family Medicine Teaching Service ?Daily Progress Note ?Intern Pager: [phone] ? ?[name] Medical record number: [id number] ?Date of birth: 1936/01/13 Age: 86 y.o. Gender: female ? ?Primary Care Provider: Dickie La, MD ?Consultants: Cardiology, neurology, palliative ?Code Status: Full ? ?Pt Overview and Major Events to Date:  ?Patient is an 86 year old female who presented with dyspnea on exertion and chest pain.  PMH is significant for HFpEF (now HFrEF), S/P mechanical mitral valve on Coumadin, permanent A-fib, HTN, CKD stage III, osteoporosis, history of right hemicolectomy, GERD ? ?Assessment and Plan: ? ?Acute hypoxic respiratory failure secondary to new HFrEF, improving ?Patient breathing comfortably on room air this morning with oxygen saturations in the high 90s.  Patient had Foley removed yesterday and had 24 UOP documented as 625 mL but there states overnight patient had a lot of unmeasured urine in the bed, PureWick was placed and is leaking. Weight is down to 73.1 kg today from 75.5 kg yesterday and 79.7 kg since admission.  Cardiology is planning for right and left heart cath potentially tomorrow as patient had drastic drop in EF (echo from July 2022 showed EF of 50 to 55%, echo from 03/21/2021 showed EF  30-45%). ?-Cardiology consulted ?-Metoprolol succinate 200 mg daily ?-Continue home Imdur ?-Strict I's and O's and daily weights ?-A.m. BMP ?-Palliative care consulted ? ?ARF on CKD stage III ?Patient received 1 dose of IV Lasix 40 mg IV on 03/22/2021 followed by ARF (creatinine 5.37, GFR 7 on 3/4).  This drastic change is unlikely due to diuresis and there may have been a component of bladder outlet obstruction.  Foley placed on 3/4 with UOP of 1.85 L.  Tamsulosin was started on 3 and Foley was removed yesterday with postvoid bladder scan showing 320 mL per nurse.  This morning creatinine continues to improve and is 1.87, GFR 26. ?-Continue tamsulosin 0.4 mg daily ?-post void bladder  scans today ?-Strict I's and O's ?-Daily BMP ?-Avoid nephrotoxins ? ?S/p mechanical mitral valve  persistent A-fib ?INR down to 1.9 today, heparin started yesterday evening instead of warfarin due to planning for left and right heart cath.  Patient has heart rate in 1teens-120's (only with movement) this morning ?-Cardiology following ?-Metoprolol succinate 200 mg daily ?-Metoprolol tartrate 25 mg 3 times daily as needed for heart rate> 120 ?-Heparin per pharmacy ? ?Hypertension ?BP this morning of 130/102. Has ranged 117-169/82-121 in last 24 hours ?-Continue Imdur 30 mg daily ?-Increase Hydralazine to 50 mg every 8 hours ?-Metoprolol succinate 200 mg daily ? ?Delirium, mild cognitive disorder, resolved ?Patient is alert and oriented today ? ?Prolonged QTc ?QTc slightly elevated to 484 at admission ?-Judiciously use QTc prolonging medications ? ?GERD ?Continue home pantoprazole 40 mg daily ? ?Delirium, mild cognitive disorder, resolved ?Patient is alert and oriented today, understands what the plans are and why she is here ? ? ?FEN/GI: Heart healthy diet ?PPx: Heparin ?Dispo: Pending continued medical management ? ?Subjective:  ?Patient states she is feeling very tired this morning.  She has good understanding of what is going on and what the plan is. ? ?Objective: ?Temp:  [97.9 ?F (36.6 ?C)-99.1 ?F (37.3 ?C)] 97.9 ?F (36.6 ?C) (03/08 0403) ?Pulse Rate:  [93-130] 103 (03/07 2148) ?Resp:  [18] 18 (03/08 0403) ?BP: (117-169)/(82-121) 147/99 (03/08 0403) ?SpO2:  [94 %-99 %] 99 % (03/08 0403) ?Weight:  [73.1 kg] 73.1 kg (03/07 0538) ?Physical Exam: ?General: lying in bed resting, NAD ?Cardiovascular: irregularly irregular, tachycardic ?Respiratory: CTAB, normal effort ?Extremities:  no edema of BLEs ? ?Laboratory: ?Recent Labs  ?Lab 03/25/21 ?0307 03/26/21 ?0309 03/27/21 ?[id number]  ?WBC 7.4 7.4 8.5  ?HGB 10.6* 11.8* 12.3  ?HCT 35.1* 39.3 40.4  ?PLT 183 182 201  ? ?Recent Labs  ?Lab 03/25/21 ?0307 03/26/21 ?0309  03/27/21 ?[id number]  ?NA 140 140 140  ?K 3.5 3.7 4.6  ?CL 107 105 105  ?CO2 24 25 24   ?BUN 56* 48* 37*  ?CREATININE 2.86* 2.11* 1.87*  ?CALCIUM 9.0 9.3 9.3  ?PROT 6.6  --   --   ?BILITOT 0.9  --   --   ?ALKPHOS 38  --   --   ?ALT 15  --   --   ?AST 18  --   --   ?GLUCOSE 97 99 109*  ? ? ? ?Precious Gilding, DO ?03/27/2021, 5:03 AM ?PGY-1, [place] ?[place] Intern pager: [phone], text pages welcome ? ?

## 2021-03-27 NOTE — Progress Notes (Signed)
ANTICOAGULATION CONSULT NOTE - Follow Up Consult ? ?Pharmacy Consult for warfarin>heparin ?Indication:  mechanical mitral valve ? ?Allergies  ?Allergen Reactions  ? Iodinated Contrast Media Rash  ?  Pt stated in the past had broken out in a rash on back and itching.   ? Esomeprazole Magnesium   ?  REACTION: stomach upset, nausea  ? ? ?Patient Measurements: ?Height: 5\' 8"  (172.7 cm) ?Weight: 73.5 kg (162 lb 0.6 oz) ?IBW/kg (Calculated) : 63.9 ?Heparin Dosing Weight: 73.1 kg ? ?Vital Signs: ?Temp: 98.2 ?F (36.8 ?C) (03/08 1350) ?Temp Source: Oral (03/08 1350) ?BP: 106/80 (03/08 1350) ?Pulse Rate: 106 (03/08 1350) ? ?Labs: ?Recent Labs  ?  03/25/21 ?0307 03/26/21 ?0309 03/27/21 ?05/27/21 03/27/21 ?0259 03/27/21 ?1404  ?HGB 10.6* 11.8*  --  12.3  --   ?HCT 35.1* 39.3  --  40.4  --   ?PLT 183 182  --  201  --   ?LABPROT 37.8* 27.5*  --  21.7*  --   ?INR 3.9* 2.6*  --  1.9*  --   ?HEPARINUNFRC  --   --  0.12*  --  0.71*  ?CREATININE 2.86* 2.11*  --  1.87*  --   ? ? ? ?Estimated Creatinine Clearance: 23 mL/min (A) (by C-G formula based on SCr of 1.87 mg/dL (H)). ? ? ?Assessment: ?86 yo F with a history of mechanical mitral valve replacement/PAF, on long-term anticoagulation with warfarin. PTA warfarin dosing: 5 mg Sun/Tues/Thurs, 10 mg all other days (last dose of warfarin 3/1). Pharmacy consulted for heparin dosing.  ?  ?INR subtherapeutic at 1.9 3/8 with plans for [facility] 3/9. Initial heparin level subtherapeutic at 0.12 on 1,000 units/hr. Heparin rate was increased to 1,200 units/hr, which resulted in a supratherapeutic level of 0.71. No issues with infusion or lab draw per RN/phlebotomy. RN is reporting some blood oozing from around the IV site where heparin is running. CBC stable/WNL.  ? ?Of note, patient has been started on amiodarone for rhythm control while inpatient. She should have close outpatient follow-up with [facility] to ensure appropriate warfarin dosing as amiodarone can increase INR and patient has been  stable on her home regimen for many years.  ? ?Goal of Therapy:  ?Heparin level 0.3-0.7 units/ml ?Monitor platelets by anticoagulation protocol: Yes ?  ?Plan:  ?Decrease heparin infusion to 1,100 units/hr ?Check anti-Xa level in 8 hours and daily while on heparin ?Continue to monitor H&H and platelets ? ?5/9, PharmD ?PGY-1 Acute Care Resident  ?03/27/2021 2:47 PM  ? ? ? ?

## 2021-03-27 NOTE — Progress Notes (Signed)
?   03/27/21 1638  ?Mobility  ?Activity Contraindicated/medical hold ?(Hold per RN)  ? ? ?

## 2021-03-27 NOTE — Progress Notes (Signed)
[facility] ([facility])  Hospital Liaison: RN note         This patient has been referred to our palliative care services in the community.  [facility] will continue to follow for any discharge planning needs and to coordinate continuation of palliative care in the outpatient setting.    If you have questions or need assistance, please call [phone] or contact the hospital Liaison listed on AMION.      Thank you for this referral.         Mary Anne Robertson, RN, CCM  [facility] Hospital Liaison   [phone] 

## 2021-03-27 NOTE — Progress Notes (Signed)
? ?Progress Note ? ?[name] ?Date of Encounter: 03/28/2021 ? ?[facility] Cardiologist: Cristopher Peru, MD  ? ?Subjective  ? ?Feels okay this morning. Has mild SOB when ambulating the hallways. No LE edema, palpitations, or chest pain. ? ?Blood pressures soft 90-110s ?HR better 80-110s ?Cr rising to 2.37 today; cath delayed ? ?Inpatient Medications  ?  ?Scheduled Meds: ? [START ON 04/05/2021] amiodarone  200 mg Oral BID  ? amiodarone  400 mg Oral BID  ? Chlorhexidine Gluconate Cloth  6 each Topical Q0600  ? dorzolamide-timolol  1 drop Both Eyes BID  ? hydrALAZINE  50 mg Oral Q8H  ? isosorbide mononitrate  30 mg Oral Daily  ? latanoprost  1 drop Both Eyes QHS  ? metoprolol succinate  200 mg Oral Daily  ? pantoprazole  40 mg Oral Daily  ? psyllium  1 packet Oral Daily  ? sodium chloride flush  3 mL Intravenous Q12H  ? tamsulosin  0.4 mg Oral Daily  ? ?Continuous Infusions: ? sodium chloride    ? sodium chloride 30 mL/hr at 03/27/21 2347  ? heparin 1,100 Units/hr (03/27/21 1814)  ? ?PRN Meds: ?sodium chloride, acetaminophen, albuterol, diclofenac Sodium, metoprolol tartrate, senna, sodium chloride flush  ? ?Vital Signs  ?  ?Vitals:  ? 03/28/21 0130 03/28/21 0235 03/28/21 0550 03/28/21 0618  ?BP: 109/74 121/85 107/77 107/77  ?Pulse:    (!) 108  ?Resp:    18  ?Temp:    97.9 ?F (36.6 ?C)  ?TempSrc:    Oral  ?SpO2:    99%  ?Weight:    72.7 kg  ?Height:      ? ? ?Intake/Output Summary (Last 24 hours) at 03/28/2021 0735 ?Last data filed at 03/28/2021 [id number] ?Gross per 24 hour  ?Intake 472.03 ml  ?Output 750 ml  ?Net -277.97 ml  ? ?Last 3 Weights 03/28/2021 03/27/2021 03/26/2021  ?Weight (lbs) 160 lb 4.4 oz 162 lb 0.6 oz 161 lb 3.2 oz  ?Weight (kg) 72.7 kg 73.5 kg 73.12 kg  ?   ? ?Telemetry  ?  ?Afib with RVR currently in 140s- Personally Reviewed ? ?ECG  ?  ?No new tracing today ? ?Physical Exam  ? ?GEN: No acute distress.  Comfortable ?Neck: No JVD ?Cardiac: Irregularly irregular, tachycardic, +mechanical  click ?Respiratory: Clear to auscultation bilaterally. ?GI: Soft, nontender, non-distended  ?MS: No edema; No deformity.  ?Neuro:  Nonfocal  ?Psych: Normal affect  ? ?Labs  ?  ?High Sensitivity Troponin:   ?Recent Labs  ?Lab 03/20/21 ?1340 03/20/21 ?1647  ?TROPONINIHS 14 14  ?   ?Chemistry ?Recent Labs  ?Lab 03/22/21 ?[id number] 03/23/21 ?[id number] 03/24/21 ?0109 03/25/21 ?[id number] 03/26/21 ?[id number] 03/27/21 ?[id number] 03/28/21 ?[id number]  ?NA 139 139 138 140 140 140 139  ?K 4.1 4.1 3.5 3.5 3.7 4.6 4.0  ?CL 105 104 103 107 105 105 107  ?CO2 24 22 24 24 25 24 23   ?GLUCOSE 113* 114* 110* 97 99 109* 125*  ?BUN 37* 59* 63* 56* 48* 37* 46*  ?CREATININE 2.87* 5.37* 4.34* 2.86* 2.11* 1.87* 2.37*  ?CALCIUM 9.1 9.3 9.1 9.0 9.3 9.3 8.9  ?MG 2.4 2.4  --   --   --   --   --   ?PROT  --   --   --  6.6  --   --   --   ?ALBUMIN  --   --  3.2* 2.9*  --   --   --   ?AST  --   --   --  18  --   --   --   ?ALT  --   --   --  15  --   --   --   ?ALKPHOS  --   --   --  43  --   --   --   ?BILITOT  --   --   --  0.9  --   --   --   ?GFRNONAA 16* 7* 10* 16* 23* 26* 20*  ?ANIONGAP 10 13 11 9 10 11 9   ?  ?Lipids No results for input(s): CHOL, TRIG, HDL, LABVLDL, LDLCALC, CHOLHDL in the last 168 hours.  ?Hematology ?Recent Labs  ?Lab 03/26/21 ?0309 03/27/21 ?[id number] 03/28/21 ?0252  ?WBC 7.4 8.5 11.5*  ?RBC 4.39 4.54 4.04  ?HGB 11.8* 12.3 11.1*  ?HCT 39.3 40.4 35.7*  ?MCV 89.5 89.0 88.4  ?MCH 26.9 27.1 27.5  ?MCHC 30.0 30.4 31.1  ?RDW 15.6* 15.5 15.5  ?PLT 182 201 211  ? ?Thyroid No results for input(s): TSH, FREET4 in the last 168 hours.  ?BNP ?Recent Labs  ?Lab 03/22/21 ?0612  ?BNP 1,374.9*  ?  ?DDimer No results for input(s): DDIMER in the last 168 hours.  ? ?Radiology  ?  ?No results found. ? ?Cardiac Studies  ? ?TTE 03/21/21: ?IMPRESSIONS  ? 1. Left ventricular ejection fraction, by estimation, is 35 to 40%. The  ?left ventricle has moderately decreased function. The left ventricle  ?demonstrates global hypokinesis. There is moderate left ventricular  ?hypertrophy.  Left ventricular diastolic  ?function could not be evaluated.  ? 2. Right ventricular systolic function is mildly reduced. The right  ?ventricular size is normal.  ? 3. Left atrial size was Massively dilated at 126 ml/m2.  ? 4. Right atrial size was severely dilated.  ? 5. The mitral valve has been repaired/replaced. Trivial mitral valve  ?regurgitation. No evidence of mitral stenosis. There is a St. Jude  ?mechanical valve present in the mitral position. Procedure Date: 56.  ?Echo findings are consistent with normal  ?structure and function of the mitral valve prosthesis.  ? 6. The aortic valve is calcified. Aortic valve regurgitation is not  ?visualized. Mild to moderate aortic valve stenosis. Aortic valve area, by  ?VTI measures 1.48 cm?Marland Kitchen Aortic valve mean gradient measures 8.0 mmHg.  ?Aortic valve Vmax measures 1.94 m/s.  ? ?Comparison(s): Changes from prior study are noted. 07/25/2020: LVEF 50-55%. ? ?Patient Profile  ?   ?86 y.o. female with PMH of chronic diastolic CHF, chronic atrial fibrillation on coumadin, St Jude mechanical MVR 1987, mild AS, NSVT, RBBB, HTN, CKD stage III, GERD and chronic anemia who presented on 3/1 with worsening dyspnea for 3 month. BNP elevated at 1633 ? ?Assessment & Plan  ?  ?#Acute on chronic combined heart failure: ?Newly diagnosed on this admission. TTE 03/21/21 with LVEF 35-40%, global hypokinesis, moderate LVH, mild RV systolic dysfunction, severe biatrial enlargement, mechanical MVR with normal function, mild-to-moderate AS. Lasix held due to worsening renal function with concern for possible obstruction given brisk UOP after foley placement. No hydronephrosis on ultrasound.  Renal function initially improved but now back up to 2.37. ?- Will defer RHC/LHC for now given rise in Cr ?- Continue imdur 30mg  daily ?- Hold hydralazine given soft blood pressures overnight ?- Continue metop 200mg  XL daily ?- Cannot add ACE/ARNI/ARB or spiro due to renal dysfunction; will add as  able ?- Monitor I/Os and daily weights ?- Low Na diet ? ?#ARF CKD IIIa ?Developed significant AKI  that was more rapid than would have been expected than with diuresis alone earlier during hospitalization. Suspect element of bladder outlet obstruction given brisk diuresis after foley placement. Renal ultrasound with no hydronephrosis. Initially improved but rose again overnight. Possibly acute rise due to Afib with RVR and relatively soft blood pressures. ?-No cath today ?-Will manage Afib as below ?-Hold hydralazine given soft pressures ? ?#Saint Jude mechanical mitral valve 1987: ?TTE with normal functioning mechanical valve. ?- Routine follow-up as outpatient ?- Warfarin held given plans for possible LHC/RHC ?- Continue heparin gtt for now ? ?#Persistent atrial fibrillation: ?In Afib with RVR currently. ?-Start amiodarone bolus +gtt ?-Hold PO while on IV ?-Continue metop 200mg  XL daily ?-Warfarin held given plans for possible LHC/RHC ?-Continue heparin gtt for now ? ?#Mild-to-moderate AS: ?-Will need routine follow-up as out-patient ? ?#HTN: ?Soft this morning ?- Continue imdur 30mg  daily ?- Hold hydralazine given soft blood pressures overnight ?- Continue metop 200mg  XL daily ? ?Will cancel cath for today given rising Cr. Plan for amiodarone gtt for Afib with RVR. Hold diuresis as does not appear grossly overloaded on exam. ? ?For questions or updates, please contact [place] ?Please consult [email or web address] for contact info under  ? ?  ?   ?Signed, ?Freada Bergeron, MD  ?03/28/2021, 7:35 AM   ? ?

## 2021-03-27 NOTE — Progress Notes (Signed)
FPTS Interim Night Progress Note ? ?S:Patient sleeping comfortably.  Rounded with primary night RN. Has voided since foley catheter removal on previous shift.  No concerns voiced.  No orders required.   ? ?O: ?Today's Vitals  ? 03/26/21 2148 03/26/21 2235 03/27/21 0403 03/27/21 0514  ?BP: (!) 153/97  (!) 147/99   ?Pulse: (!) 103   (!) 123  ?Resp: 18  18   ?Temp: 99.1 ?F (37.3 ?C)  97.9 ?F (36.6 ?C)   ?TempSrc: Axillary  Oral   ?SpO2: 97%  99% 96%  ?Weight:    73.5 kg  ?Height:      ?PainSc:  0-No pain    ? ? ? ? ?A/P: ?Continue current management ? ?Dana Allan MD ?PGY-3, [facility] ?Service pager [phone]   ?

## 2021-03-27 NOTE — Progress Notes (Addendum)
ANTICOAGULATION CONSULT NOTE - Follow Up Consult ? ?Pharmacy Consult for warfarin>heparin ?Indication:  mechanical mitral valve ? ?Allergies  ?Allergen Reactions  ? Iodinated Contrast Media Rash  ?  Pt stated in the past had broken out in a rash on back and itching.   ? Esomeprazole Magnesium   ?  REACTION: stomach upset, nausea  ? ? ?Patient Measurements: ?Height: 5\' 8"  (172.7 cm) ?Weight: 73.1 kg (161 lb 3.2 oz) ?IBW/kg (Calculated) : 63.9 ?Heparin Dosing Weight: 73.1 kg ? ?Vital Signs: ?Temp: 99.1 ?F (37.3 ?C) (03/07 2148) ?Temp Source: Axillary (03/07 2148) ?BP: 153/97 (03/07 2148) ?Pulse Rate: 103 (03/07 2148) ? ?Labs: ?Recent Labs  ?  03/25/21 ?0307 03/26/21 ?0309 03/27/21 ?[id number] 03/27/21 ?0259  ?HGB 10.6* 11.8*  --  12.3  ?HCT 35.1* 39.3  --  40.4  ?PLT 183 182  --  201  ?LABPROT 37.8* 27.5*  --  21.7*  ?INR 3.9* 2.6*  --  1.9*  ?HEPARINUNFRC  --   --  0.12*  --   ?CREATININE 2.86* 2.11*  --   --   ? ? ?Estimated Creatinine Clearance: 20.4 mL/min (A) (by C-G formula based on SCr of 2.11 mg/dL (H)). ? ? ?Assessment: ?86 yo F with a history of mechanical mitral valve replacement, on long-term anticoagulation with warfarin. PTA warfarin dosing: 5 mg Sun/Tues/Thurs, 10 mg all other days. Holding warfarin for potential heart catheterization later this week. Pharmacy consulted for heparin dosing.  ?  ?INR was therapeutic 3/7 at 2.6 with last dose of warfarin 3/1. Patient INR was expected to decrease based on trend so heparin was started. INR is now low at 1.9. CBC stable. No signs/symptoms of bleeding noted.   ? ?INR subtherapeutic 2.6>1.9, initial heparin level subtherapeutic at 0.12 on 1000 units/hr. ? ?Goal of Therapy:  ?Heparin level 0.3-0.7 units/ml ?Monitor platelets by anticoagulation protocol: Yes ?  ?Plan:  ?Increase heparin infusion to 1200 units/hr ?Check anti-Xa level in 8 hours and daily while on heparin ?Continue to monitor H&H and platelets ? ?Carma Lair, PharmD Candidate [phone] ?03/27/2021,3:57  AM ? ? ?

## 2021-03-27 NOTE — Evaluation (Signed)
Occupational Therapy Evaluation ?Patient Details ?Name: [name] ?MRN: [id number] ?DOB: [birth year] ?Today's Date: 03/27/2021 ? ? ?History of Present Illness 86 yo admitted 3/1 with DOE and CP. Pt with acute hypoxic respiratory failure secondary to new HFrEF, with acute renal failure.  PMHx:HFpEF, s/p MVR, permanent Afib, HTN, CKD stage III, osteoporosis, Rt hemicolectomy, cirrhosis, GERD  ? ?Clinical Impression ?  ?Pt lives alone, furniture walks in her apartment and uses a cane in the community. She is typically independent in self care, meal prep and light housekeeping. Her family assists with getting groceries. Pt presents with decreased activity tolerance, generalized weakness and impaired dynamic standing balance. She requires up to min assist for ADLs and min guard assist with RW for OOB. Pt needs encouragement to mobilize, educated in importance of keeping her strength up in preparation for discharge. HR variable throughout session from 90s to upper 120's.  Pt will consider going home with her daughter briefly, if needed, prior to returning to her own home.  ?   ? ?Recommendations for follow up therapy are one component of a multi-disciplinary discharge planning process, led by the attending physician.  Recommendations may be updated based on patient status, additional functional criteria and insurance authorization.  ? ?Follow Up Recommendations ? Home health OT  ?  ?Assistance Recommended at Discharge Intermittent Supervision/Assistance  ?Patient can return home with the following A little help with walking and/or transfers;A little help with bathing/dressing/bathroom;Assistance with cooking/housework;Assist for transportation;Help with stairs or ramp for entrance ? ?  ?Functional Status Assessment ? Patient has had a recent decline in their functional status and demonstrates the ability to make significant improvements in function in a reasonable and predictable amount of time.  ?Equipment  Recommendations ? None recommended by OT  ?  ?Recommendations for Other Services   ? ? ?  ?Precautions / Restrictions Precautions ?Precautions: Fall ?Precaution Comments: watch HR  ? ?  ? ?Mobility Bed Mobility ?Overal bed mobility: Modified Independent ?  ?  ?  ?  ?  ?  ?General bed mobility comments: assists R LE to EOB using her hands ?  ? ?Transfers ?Overall transfer level: Needs assistance ?Equipment used: Rolling walker (2 wheels) ?Transfers: Sit to/from Stand ?Sit to Stand: Min guard, From elevated surface ?  ?  ?  ?  ?  ?General transfer comment: elevate surface ?  ? ?  ?Balance Overall balance assessment: Needs assistance ?Sitting-balance support: No upper extremity supported ?Sitting balance-Leahy Scale: Good ?  ?  ?Standing balance support: No upper extremity supported, During functional activity ?Standing balance-Leahy Scale: Fair ?Standing balance comment: fair statically at sink, B UE support with ambulation ?  ?  ?  ?  ?  ?  ?  ?  ?  ?  ?  ?   ? ?ADL either performed or assessed with clinical judgement  ? ?ADL Overall ADL's : Needs assistance/impaired ?Eating/Feeding: Independent;Bed level ?  ?Grooming: Wash/dry hands;Standing;Min guard ?  ?Upper Body Bathing: Set up;Sitting ?  ?Lower Body Bathing: Minimal assistance;Sit to/from stand ?  ?Upper Body Dressing : Set up;Sitting ?  ?Lower Body Dressing: Minimal assistance;Sit to/from stand ?  ?Toilet Transfer: Min guard;Ambulation;BSC/3in1;Rolling walker (2 wheels) ?  ?Toileting- Water quality scientist and Hygiene: Min guard;Sit to/from stand ?  ?  ?  ?Functional mobility during ADLs: Min guard;Rolling walker (2 wheels) ?   ? ? ? ?Vision Ability to See in Adequate Light: 0 Adequate ?   ?   ?Perception   ?  ?  Praxis   ?  ? ?Pertinent Vitals/Pain Pain Assessment ?Pain Assessment: No/denies pain  ? ? ? ?Hand Dominance Right ?  ?Extremity/Trunk Assessment Upper Extremity Assessment ?Upper Extremity Assessment: Overall WFL for tasks assessed;Generalized  weakness ?  ?Lower Extremity Assessment ?Lower Extremity Assessment: Defer to PT evaluation ?  ?Cervical / Trunk Assessment ?Cervical / Trunk Assessment: Normal ?  ?Communication Communication ?Communication: HOH ?  ?Cognition Arousal/Alertness: Awake/alert ?Behavior During Therapy: [facility] for tasks assessed/performed ?Overall Cognitive Status: Within Functional Limits for tasks assessed ?  ?  ?  ?  ?  ?  ?  ?  ?  ?  ?  ?  ?  ?  ?  ?  ?General Comments: pt somewhat slow processing MD's explanation of cardiac cath and making decision as to whether to proceed ?  ?  ?General Comments    ? ?  ?Exercises   ?  ?Shoulder Instructions    ? ? ?Home Living Family/patient expects to be discharged to:: Private residence ?Living Arrangements: Alone ?Available Help at Discharge: Family;Available PRN/intermittently ?Type of Home: Apartment ?Home Access: Level entry ?  ?  ?Home Layout: One level ?  ?  ?Bathroom Shower/Tub: Tub/shower unit ?  ?Bathroom Toilet: Standard ?  ?  ?Home Equipment: Conservation officer, nature (2 wheels);Cane - single point;Shower seat;Grab bars - tub/shower;Wheelchair - manual ?  ?  ?  ? ?  ?Prior Functioning/Environment Prior Level of Function : Independent/Modified Independent ?  ?  ?  ?  ?  ?  ?Mobility Comments: furniture walks in apartment, cane in community ?ADLs Comments: daughter takes her to the grocery store, pt does her own cooking ?  ? ?  ?  ?OT Problem List: Decreased strength;Decreased activity tolerance;Impaired balance (sitting and/or standing);Decreased knowledge of use of DME or AE;Cardiopulmonary status limiting activity ?  ?   ?OT Treatment/Interventions: Self-care/ADL training;Energy conservation;DME and/or AE instruction;Therapeutic activities;Patient/family education;Balance training  ?  ?OT Goals(Current goals can be found in the care plan section) Acute Rehab OT Goals ?OT Goal Formulation: With patient ?Time For Goal Achievement: 04/10/21 ?Potential to Achieve Goals: Good ?ADL Goals ?Pt Will  Perform Grooming: with modified independence;standing ?Pt Will Perform Lower Body Bathing: with modified independence;sit to/from stand ?Pt Will Perform Lower Body Dressing: with modified independence;sit to/from stand ?Pt Will Transfer to Toilet: with modified independence;ambulating ?Pt Will Perform Toileting - Clothing Manipulation and hygiene: with modified independence;sit to/from stand ?Additional ADL Goal #1: Pt will state at least 3 energy conservation strategies as instructed.  ?OT Frequency: Min 2X/week ?  ? ?Co-evaluation   ?  ?  ?  ?  ? ?  ?AM-PAC OT "6 Clicks" Daily Activity     ?Outcome Measure Help from another person eating meals?: None ?Help from another person taking care of personal grooming?: A Little ?Help from another person toileting, which includes using toliet, bedpan, or urinal?: A Little ?Help from another person bathing (including washing, rinsing, drying)?: A Little ?Help from another person to put on and taking off regular upper body clothing?: None ?Help from another person to put on and taking off regular lower body clothing?: A Little ?6 Click Score: 20 ?  ?End of Session Equipment Utilized During Treatment: Rolling walker (2 wheels);Gait belt ? ?Activity Tolerance: Patient tolerated treatment well ?Patient left: in bed;with call bell/phone within reach;with bed alarm set ? ?OT Visit Diagnosis: Unsteadiness on feet (R26.81);Other abnormalities of gait and mobility (R26.89);Muscle weakness (generalized) (M62.81);Other (comment) (decreased activity tolerance)  ?              ?  Time: [id number] ?OT Time Calculation (min): 36 min ?Charges:  OT General Charges ?$OT Visit: 1 Visit ?OT Evaluation ?$OT Eval Moderate Complexity: 1 Mod ?OT Treatments ?$Self Care/Home Management : 8-22 mins ? ?Nestor Lewandowsky, OTR/L ?Acute Rehabilitation Services ?Pager: [phone] ?Office: [phone]  ? ?Malka So ?03/27/2021, 10:10 AM ?

## 2021-03-27 NOTE — Plan of Care (Signed)
?  Problem: Clinical Measurements: ?Goal: Respiratory complications will improve ?Outcome: Progressing ?  ?Problem: Activity: ?Goal: Risk for activity intolerance will decrease ?Outcome: Progressing ?  ?Problem: Nutrition: ?Goal: Adequate nutrition will be maintained ?Outcome: Progressing ?  ?Problem: Coping: ?Goal: Level of anxiety will decrease ?Outcome: Progressing ?  ?Problem: Elimination: ?Goal: Will not experience complications related to bowel motility ?Outcome: Progressing ?Goal: Will not experience complications related to urinary retention ?Outcome: Not Applicable ?  ?Problem: Pain Managment: ?Goal: General experience of comfort will improve ?Outcome: Progressing ?  ?Problem: Safety: ?Goal: Ability to remain free from injury will improve ?Outcome: Progressing ?  ?Problem: Skin Integrity: ?Goal: Risk for impaired skin integrity will decrease ?Outcome: Progressing ?  ?

## 2021-03-27 NOTE — Progress Notes (Signed)
? ?Progress Note ? ?[name] ?Date of Encounter: 03/27/2021 ? ?[facility] Cardiologist: Cristopher Peru, MD  ? ?Subjective  ? ?Doing well this morning. Working with OT.  ? ?HR remain 90-110s in Afib ?Cr continuing to improve 2.11>1.87 ?Wt 161>162lbs ? ?Inpatient Medications  ?  ?Scheduled Meds: ? dorzolamide-timolol  1 drop Both Eyes BID  ? hydrALAZINE  25 mg Oral Q8H  ? isosorbide mononitrate  30 mg Oral Daily  ? latanoprost  1 drop Both Eyes QHS  ? metoprolol succinate  200 mg Oral Daily  ? pantoprazole  40 mg Oral Daily  ? psyllium  1 packet Oral Daily  ? tamsulosin  0.4 mg Oral Daily  ? ?Continuous Infusions: ? heparin 1,200 Units/hr (03/27/21 0518)  ? ?PRN Meds: ?acetaminophen, albuterol, diclofenac Sodium, metoprolol tartrate, senna  ? ?Vital Signs  ?  ?Vitals:  ? 03/26/21 1210 03/26/21 2148 03/27/21 0403 03/27/21 0514  ?BP:  (!) 153/97 (!) 147/99   ?Pulse:  (!) 103  (!) 123  ?Resp:  18 18   ?Temp:  99.1 ?F (37.3 ?C) 97.9 ?F (36.6 ?C)   ?TempSrc:  Axillary Oral   ?SpO2: 95% 97% 99% 96%  ?Weight:    73.5 kg  ?Height:      ? ? ?Intake/Output Summary (Last 24 hours) at 03/27/2021 0734 ?Last data filed at 03/27/2021 0600 ?Gross per 24 hour  ?Intake 469.97 ml  ?Output 625 ml  ?Net -155.03 ml  ? ? ?Last 3 Weights 03/27/2021 03/26/2021 03/25/2021  ?Weight (lbs) 162 lb 0.6 oz 161 lb 3.2 oz 166 lb 7.2 oz  ?Weight (kg) 73.5 kg 73.12 kg 75.5 kg  ?   ? ?Telemetry  ?  ?Afib with episodes of RVR up to 130s- Personally Reviewed ? ?ECG  ?  ?No new tracing today ? ?Physical Exam  ? ?GEN: No acute distress.  Comfortable ?Neck: No JVD ?Cardiac: Irregular, tachycardic, +mechanical click ?Respiratory: Clear to auscultation bilaterally. ?GI: Soft, nontender, non-distended  ?MS: No edema; No deformity.  ?Neuro:  Nonfocal  ?Psych: Normal affect  ? ?Labs  ?  ?High Sensitivity Troponin:   ?Recent Labs  ?Lab 03/20/21 ?1340 03/20/21 ?1647  ?TROPONINIHS 14 14  ? ?   ?Chemistry ?Recent Labs  ?Lab 03/21/21 ?[id number] 03/22/21 ?[id number]  03/23/21 ?[id number] 03/24/21 ?0109 03/25/21 ?[id number] 03/26/21 ?[id number] 03/27/21 ?[id number]  ?NA 140 139 139 138 140 140 140  ?K 4.7 4.1 4.1 3.5 3.5 3.7 4.6  ?CL 105 105 104 103 107 105 105  ?CO2 22 24 22 24 24 25 24   ?GLUCOSE 116* 113* 114* 110* 97 99 109*  ?BUN 23 37* 59* 63* 56* 48* 37*  ?CREATININE 1.85* 2.87* 5.37* 4.34* 2.86* 2.11* 1.87*  ?CALCIUM 9.6 9.1 9.3 9.1 9.0 9.3 9.3  ?MG 1.8 2.4 2.4  --   --   --   --   ?PROT  --   --   --   --  6.6  --   --   ?ALBUMIN  --   --   --  3.2* 2.9*  --   --   ?AST  --   --   --   --  18  --   --   ?ALT  --   --   --   --  15  --   --   ?ALKPHOS  --   --   --   --  56  --   --   ?BILITOT  --   --   --   --  0.9  --   --   ?GFRNONAA 27* 16* 7* 10* 16* 23* 26*  ?ANIONGAP 13 10 13 11 9 10 11   ? ?  ?Lipids No results for input(s): CHOL, TRIG, HDL, LABVLDL, LDLCALC, CHOLHDL in the last 168 hours.  ?Hematology ?Recent Labs  ?Lab 03/25/21 ?0307 03/26/21 ?0309 03/27/21 ?[id number]  ?WBC 7.4 7.4 8.5  ?RBC 3.94 4.39 4.54  ?HGB 10.6* 11.8* 12.3  ?HCT 35.1* 39.3 40.4  ?MCV 89.1 89.5 89.0  ?MCH 26.9 26.9 27.1  ?MCHC 30.2 30.0 30.4  ?RDW 15.8* 15.6* 15.5  ?PLT 183 182 201  ? ? ?Thyroid No results for input(s): TSH, FREET4 in the last 168 hours.  ?BNP ?Recent Labs  ?Lab 03/20/21 ?1340 03/22/21 ?0612  ?BNP 1,633.6* 1,374.9*  ? ?  ?DDimer No results for input(s): DDIMER in the last 168 hours.  ? ?Radiology  ?  ?No results found. ? ?Cardiac Studies  ? ?TTE 03/21/21: ?IMPRESSIONS  ? 1. Left ventricular ejection fraction, by estimation, is 35 to 40%. The  ?left ventricle has moderately decreased function. The left ventricle  ?demonstrates global hypokinesis. There is moderate left ventricular  ?hypertrophy. Left ventricular diastolic  ?function could not be evaluated.  ? 2. Right ventricular systolic function is mildly reduced. The right  ?ventricular size is normal.  ? 3. Left atrial size was Massively dilated at 126 ml/m2.  ? 4. Right atrial size was severely dilated.  ? 5. The mitral valve has been  repaired/replaced. Trivial mitral valve  ?regurgitation. No evidence of mitral stenosis. There is a St. Jude  ?mechanical valve present in the mitral position. Procedure Date: 31.  ?Echo findings are consistent with normal  ?structure and function of the mitral valve prosthesis.  ? 6. The aortic valve is calcified. Aortic valve regurgitation is not  ?visualized. Mild to moderate aortic valve stenosis. Aortic valve area, by  ?VTI measures 1.48 cm?Marland Kitchen Aortic valve mean gradient measures 8.0 mmHg.  ?Aortic valve Vmax measures 1.94 m/s.  ? ?Comparison(s): Changes from prior study are noted. 07/25/2020: LVEF 50-55%. ? ?Patient Profile  ?   ?86 y.o. female with PMH of chronic diastolic CHF, chronic atrial fibrillation on coumadin, St Jude mechanical MVR 1987, mild AS, NSVT, RBBB, HTN, CKD stage III, GERD and chronic anemia who presented on 3/1 with worsening dyspnea for 3 month. BNP elevated at 1633 ? ?Assessment & Plan  ?  ?#Acute on chronic combined heart failure: ?Newly diagnosed on this admission. TTE 03/21/21 with LVEF 35-40%, global hypokinesis, moderate LVH, mild RV systolic dysfunction, severe biatrial enlargement, mechanical MVR with normal function, mild-to-moderate AS. Lasix held due to worsening renal function with concern for possible obstruction given brisk UOP after foley placement. No hydronephrosis on ultrasound.  Renal function improving. ?- Plan for RHC/LHC tomorrow pending continued improvement in renal function ?- Holding diuresis; renal function improving ?- Continue imdur 30mg  daily ?- Increase hydralazine to 50mg  q8h ?- Continue metop 200mg  XL daily ?- Cannot add ACE/ARNI/ARB or spiro due to renal dysfunction; will add as able ?- Monitor I/Os and daily weights ?- Low Na diet ? ?#ARF CKD IIIa ?Improved. Developed significant AKI that was more rapid than would have been expected than with diuresis alone. Suspect element of bladder outlet obstruction given brisk diuresis after foley placement. Renal  ultrasound with no hydronephrosis. Now improving. ?- Trend ?- Diuresis held ? ?#Saint Jude mechanical mitral valve 1987: ?TTE with normal functioning mechanical valve. ?- Routine follow-up as outpatient ?- Warfarin held given plans for  possible LHC/RHC ?- Continue heparin gtt for now ? ?#Persistent atrial fibrillation: ?HR remain elevated 120s this morning. ?-Continue metop 200mg  XL daily ?-Continue metop tartrate 25mg  TID prn for HR>120 ?-Can add amiodarone if persistently >120s today ?-Warfarin held given plans for possible LHC/RHC ?-Continue heparin gtt for now ? ?#Mild-to-moderate AS: ?-Will need routine follow-up as out-patient ? ?#HTN: ?- Continue imdur 30mg  daily ?- Increase hydralazine to 50mg  q8h ?- Continue metop 200mg  XL daily ? ?Long discussion held with patient about option of cath vs medical management. She would like to proceed with cath at this time and full scope of care. Discussed risks and benefits at length with her. She declined the offer for me to speak with her family. Will plan for tomorrow pending renal function. ? ?INFORMED CONSENT: ?I have reviewed the risks, indications, and alternatives to cardiac catheterization, possible angioplasty, and stenting with the patient. Risks include but are not limited to bleeding, infection, vascular injury, stroke, myocardial infection, arrhythmia, kidney injury, radiation-related injury in the case of prolonged fluoroscopy use, emergency cardiac surgery, and death. The patient understands the risks of serious complication is 1-2 in [id number] with diagnostic cardiac cath and 1-2% or less with angioplasty/stenting.   ? ?For questions or updates, please contact [facility] ?Please consult [email or web address] for contact info under  ? ?  ?   ?Signed, ?Freada Bergeron, MD  ?03/27/2021, 7:34 AM   ? ?

## 2021-03-28 DIAGNOSIS — I5021 Acute systolic (congestive) heart failure: Secondary | ICD-10-CM

## 2021-03-28 DIAGNOSIS — N183 Chronic kidney disease, stage 3 unspecified: Secondary | ICD-10-CM

## 2021-03-28 LAB — BASIC METABOLIC PANEL
Anion gap: 9 (ref 5–15)
BUN: 46 mg/dL — ABNORMAL HIGH (ref 8–23)
CO2: 23 mmol/L (ref 22–32)
Calcium: 8.9 mg/dL (ref 8.9–10.3)
Chloride: 107 mmol/L (ref 98–111)
Creatinine, Ser: 2.37 mg/dL — ABNORMAL HIGH (ref 0.44–1.00)
GFR, Estimated: 20 mL/min — ABNORMAL LOW (ref >60)
Glucose, Bld: 125 mg/dL — ABNORMAL HIGH (ref 70–99)
Potassium: 4 mmol/L (ref 3.5–5.1)
Sodium: 139 mmol/L (ref 135–145)

## 2021-03-28 LAB — PROTIME-INR
INR: 1.9 — ABNORMAL HIGH (ref 0.8–1.2)
Prothrombin Time: 21.7 seconds — ABNORMAL HIGH (ref 11.4–15.2)

## 2021-03-28 LAB — CBC
HCT: 35.7 % — ABNORMAL LOW (ref 36.0–46.0)
Hemoglobin: 11.1 g/dL — ABNORMAL LOW (ref 12.0–15.0)
MCH: 27.5 pg (ref 26.0–34.0)
MCHC: 31.1 g/dL (ref 30.0–36.0)
MCV: 88.4 fL (ref 80.0–100.0)
Platelets: 211 10*3/uL (ref 150–400)
RBC: 4.04 MIL/uL (ref 3.87–5.11)
RDW: 15.5 % (ref 11.5–15.5)
WBC: 11.5 10*3/uL — ABNORMAL HIGH (ref 4.0–10.5)
nRBC: 0 % (ref 0.0–0.2)

## 2021-03-28 LAB — HEPARIN LEVEL (UNFRACTIONATED): Heparin Unfractionated: 0.44 IU/mL (ref 0.30–0.70)

## 2021-03-28 MED ORDER — METOPROLOL SUCCINATE ER 50 MG PO TB24
50.0000 mg | ORAL_TABLET | Freq: Every day | ORAL | Status: DC
  Administered 2021-03-29 – 2021-04-01 (×4): 50 mg via ORAL
  Filled 2021-03-28 (×4): qty 1

## 2021-03-28 MED ORDER — HYDRALAZINE HCL 25 MG PO TABS: 25.0000 mg | ORAL_TABLET | Freq: Three times a day (TID) | ORAL | Status: DC

## 2021-03-28 MED ORDER — AMIODARONE HCL IN DEXTROSE 360-4.14 MG/200ML-% IV SOLN
60.0000 mg/h | INTRAVENOUS | Status: DC
  Administered 2021-03-28 (×2): 60 mg/h via INTRAVENOUS
  Filled 2021-03-28: qty 200

## 2021-03-28 MED ORDER — AMIODARONE HCL IN DEXTROSE 360-4.14 MG/200ML-% IV SOLN
30.0000 mg/h | INTRAVENOUS | Status: DC
  Administered 2021-03-28 – 2021-04-02 (×10): 30 mg/h via INTRAVENOUS
  Filled 2021-03-28 (×10): qty 200

## 2021-03-28 MED ORDER — SODIUM CHLORIDE 0.9 % IV SOLN: INTRAVENOUS | Status: AC

## 2021-03-28 MED ORDER — AMIODARONE LOAD VIA INFUSION
150.0000 mg | Freq: Once | INTRAVENOUS | Status: AC
  Administered 2021-03-28: 11:00:00 150 mg via INTRAVENOUS
  Filled 2021-03-28: qty 83.34

## 2021-03-28 NOTE — Progress Notes (Signed)
Family Medicine Teaching Service ?Daily Progress Note ?Intern Pager: [phone] ? ?[name] Medical record number: [id number] ?Date of birth: [birth year] Age: 86 y.o. Gender: female ? ?Primary Care Provider: Dickie La, MD ?Consultants: Cardiology, palliative ?Code Status: Full ? ?Pt Overview and Major Events to Date:  ?02/20/2021-admitted ?02/21/2021-ARF ?03/23/2021-BOO, Foley placed ?03/26/2021-Foley removed ?03/27/2021-BOO continued, Foley replaced ? ?Assessment and Plan: ?Patient is an 86 year old female who presented with dyspnea on exertion and chest pain.  PMH significant for HFpEF (now HFrEF), s/p prosthetic mitral valve on Coumadin, permanent A-fib, HTN, CKD stage III, osteoporosis, history of right hemicolectomy, GERD ? ?Acute hypoxic respiratory failure, resolved secondary to new HFrEF ?Patient is breathing comfortably on room air this morning with oxygen saturations in the high 90s.  24-hour urinary output of 750 mL.  Weight is down 72.7 kg from 73.1 kg yesterday and 79.7 since admission.  Cardiology was planning for right and left heart cath today due to drastically decreased EF of 35-40% however we will have to hold due to increased creatinine this morning. ?-Cardiology consulted ?-Metoprolol succinate 200 mg daily ?-Continue home Imdur ?-Strict I's and O's and daily weights ?-A.m. BMP ?-Palliative care consulted ? ?ARF on CKD stage III  Bacteriuria ?On 03/23/2021 patient had large creatinine bump of 5.37 with GFR of 7 and was found to have likely bladder outlet obstruction with Foley placed with urinary output of 1.85 L.  Patient was started on tamsulosin 0.4 mg daily.  Foley was removed on 3/7 and creatinine had improved to 1.87 as of yesterday.  However yesterday patient continued to retain urine with 636 mL on bladder scan with mucus and urine per RN.  Foley was replaced and UA was obtained which showed moderate leuks, greater than 50 WBC, many bacteria with 0-5 epithelial cells. Pt admits to  some mild pain with urination before foley was placed yesterday.  Urine culture pending.  Creatinine today has bumped back up to 2.37 and GFR has decreased from 26-20. ?-Continue tamsulosin 0.4 mg daily ?-Leave a Foley catheter today ?-Strict I's and O's ?-Daily BMP ?-Avoid nephrotoxins ?-Follow-up urine culture ? ?S/p mechanical mitral valve  persistent A-fib ?Yesterday and today heart rate in the 1 teens-120s. Patient was started on amiodarone yesterday with plan for Amiodarone 400 mg twice daily for total of 7 days, then 200 mg twice daily for 7 days, then 200 mg daily thereafter.  Cardiology started amiodarone drip this morning due to A-fib with RVR currently ?-Amiodarone bolus plus drip ?-Continue metoprolol succinate 200 mg daily ?-Warfarin held for possible procedure, continue heparin GTT per pharmacy ? ?Hypertension ?BP this morning of 113/83 with soft BPs overnight ranging from a 97-121/74-85. ?-Continue Imdur 30 mg daily ?-Metoprolol succinate 200 mg daily ?-Hold hydralazine given soft blood pressures overnight per cardiology ? ?FEN/GI: Heart healthy ?PPx: Heparin ?Dispo: Home pending clinical improvement ? ?Subjective:  ?Patient states she is doing well this morning.  Did admit to mild pain with urination yesterday before Foley was placed.  Denies any abdominal pain today. ? ?Objective: ?Temp:  [97.7 ?F (36.5 ?C)-98.2 ?F (36.8 ?C)] 97.7 ?F (36.5 ?C) (03/08 2237) ?Pulse Rate:  [61-110] 100 (03/08 2237) ?Resp:  [17-19] 18 (03/08 2237) ?BP: (97-146)/(67-102) 121/85 (03/09 0235) ?SpO2:  [97 %-100 %] 99 % (03/08 2237) ?Physical Exam: ?General: Patient lying in bed, NAD ?Cardiovascular: Irregularly irregular and tachycardic ?Respiratory: CTAB, normal effort ?Abdomen: Soft, nontender to palpation, nondistended, no suprapubic tenderness ?Extremities: No edema of BLEs ? ?Laboratory: ?Recent Labs  ?  Lab 03/26/21 ?0309 03/27/21 ?[id number] 03/28/21 ?0252  ?WBC 7.4 8.5 11.5*  ?HGB 11.8* 12.3 11.1*  ?HCT 39.3 40.4 35.7*   ?PLT 182 201 211  ? ?Recent Labs  ?Lab 03/25/21 ?0307 03/26/21 ?0309 03/27/21 ?[id number]  ?NA 140 140 140  ?K 3.5 3.7 4.6  ?CL 107 105 105  ?CO2 24 25 24   ?BUN 56* 48* 37*  ?CREATININE 2.86* 2.11* 1.87*  ?CALCIUM 9.0 9.3 9.3  ?PROT 6.6  --   --   ?BILITOT 0.9  --   --   ?ALKPHOS 38  --   --   ?ALT 15  --   --   ?AST 18  --   --   ?GLUCOSE 97 99 109*  ? ? ? ? ?Precious Gilding, DO ?03/28/2021, 5:40 AM ?PGY-1, [place] ?[place] Intern pager: [phone], text pages welcome ? ?

## 2021-03-28 NOTE — Progress Notes (Signed)
Physical Therapy Treatment ?Patient Details ?Name: [name] ?MRN: [id number] ?DOB: August 30, 1935 ?Today's Date: 03/28/2021 ? ? ?History of Present Illness 86 yo admitted 3/1 with DOE and CP. Pt with acute hypoxic respiratory failure secondary to new HFrEF, with acute renal failure.  PMHx:HFpEF, s/p MVR, permanent Afib, HTN, CKD stage III, osteoporosis, Rt hemicolectomy, cirrhosis, GERD ? ?  ?PT Comments  ? ? Pt pleasant and reports family is going to alternate coming to assist her at home. Pt remains limited by HR 130-150 with activity with pt asymptomatic. Pt educated for RW use and HEP.    ?Recommendations for follow up therapy are one component of a multi-disciplinary discharge planning process, led by the attending physician.  Recommendations may be updated based on patient status, additional functional criteria and insurance authorization. ? ?Follow Up Recommendations ? Home health PT ?  ?  ?Assistance Recommended at Discharge Intermittent Supervision/Assistance  ?Patient can return home with the following Assistance with cooking/housework;Assist for transportation;A little help with walking and/or transfers ?  ?Equipment Recommendations ? None recommended by PT  ?  ?Recommendations for Other Services   ? ? ?  ?Precautions / Restrictions Precautions ?Precautions: Fall ?Precaution Comments: watch HR  ?  ? ?Mobility ? Bed Mobility ?Overal bed mobility: Modified Independent ?  ?  ?  ?  ?  ?  ?General bed mobility comments: increased time with bed flat ?  ? ?Transfers ?Overall transfer level: Needs assistance ?  ?Transfers: Sit to/from Stand ?  ?  ?  ?  ?  ?  ?General transfer comment: cues for hand placement ?  ? ?Ambulation/Gait ?Ambulation/Gait assistance: Min guard ?Gait Distance (Feet): 180 Feet ?Assistive device: Rolling walker (2 wheels) ?Gait Pattern/deviations: Step-through pattern, Decreased stride length ?  ?Gait velocity interpretation: 1.31 - 2.62 ft/sec, indicative of limited community ambulator ?   ?General Gait Details: increased gait distance with rounded shoulders with cues for direction and gait limited by HR 130-150 ? ? ?Stairs ?  ?  ?  ?  ?  ? ? ?Wheelchair Mobility ?  ? ?Modified Rankin (Stroke Patients Only) ?  ? ? ?  ?Balance Overall balance assessment: Needs assistance ?Sitting-balance support: No upper extremity supported ?Sitting balance-Leahy Scale: Good ?Sitting balance - Comments: EOb without UE support ?  ?Standing balance support: Bilateral upper extremity supported, No upper extremity supported ?Standing balance-Leahy Scale: Fair ?Standing balance comment: static without UE support, RW for gait ?  ?  ?  ?  ?  ?  ?  ?  ?  ?  ?  ?  ? ?  ?Cognition Arousal/Alertness: Awake/alert ?Behavior During Therapy: [facility] for tasks assessed/performed ?Overall Cognitive Status: Within Functional Limits for tasks assessed ?  ?  ?  ?  ?  ?  ?  ?  ?  ?  ?  ?  ?  ?  ?  ?  ?  ?  ?  ? ?  ?Exercises General Exercises - Lower Extremity ?Long Arc Quad: AROM, Both, Seated, 20 reps ?Hip Flexion/Marching: AROM, Both, Seated, 20 reps ?Toe Raises: AROM, Both, Seated, 20 reps ?Heel Raises: AROM, Both, Seated, 20 reps ? ?  ?General Comments   ?  ?  ? ?Pertinent Vitals/Pain Pain Assessment ?Pain Assessment: No/denies pain  ? ? ?Home Living   ?  ?  ?  ?  ?  ?  ?  ?  ?  ?   ?  ?Prior Function    ?  ?  ?   ? ?  PT Goals (current goals can now be found in the care plan section) Progress towards PT goals: Progressing toward goals ? ?  ?Frequency ? ? ? Min 3X/week ? ? ? ?  ?PT Plan Current plan remains appropriate  ? ? ?Co-evaluation   ?  ?  ?  ?  ? ?  ?AM-PAC PT "6 Clicks" Mobility   ?Outcome Measure ? Help needed turning from your back to your side while in a flat bed without using bedrails?: None ?Help needed moving from lying on your back to sitting on the side of a flat bed without using bedrails?: None ?Help needed moving to and from a bed to a chair (including a wheelchair)?: A Little ?Help needed standing up from a chair  using your arms (e.g., wheelchair or bedside chair)?: A Little ?Help needed to walk in hospital room?: A Little ?Help needed climbing 3-5 steps with a railing? : A Lot ?6 Click Score: 19 ? ?  ?End of Session   ?Activity Tolerance: Patient tolerated treatment well ?Patient left: in chair;with call bell/phone within reach;with chair alarm set ?Nurse Communication: Mobility status ?PT Visit Diagnosis: Other abnormalities of gait and mobility (R26.89);Muscle weakness (generalized) (M62.81) ?  ? ? ?Time: [id number] ?PT Time Calculation (min) (ACUTE ONLY): 29 min ? ?Charges:  $Gait Training: 8-22 mins ?$Therapeutic Exercise: 8-22 mins          ?          ? ?Deshaun Weisinger P, PT ?Acute Rehabilitation Services ?Pager: [phone] ?Office: [phone] ? ? ? ?Theseus Birnie B Fabio Wah ?03/28/2021, 11:06 AM ? ?

## 2021-03-28 NOTE — Progress Notes (Signed)
? ?                                                                                                                                                     ?                                                   ?Daily Progress Note  ? ?[name]       Date: 03/28/2021 ?DOB: 01/24/1935  Age: 86 y.o. MRN#: [id number] ?Attending Physician: Zenia Resides, MD ?Primary Care Physician: Dickie La, MD ?Admit Date: 03/20/2021 ? ?Reason for Consultation/Follow-up: Establishing goals of care and Psychosocial/spiritual support ? ?Subjective: ?Medical records reviewed. Patient assessed at the bedside. She is in her bedside chair eating breakfast. She is hoping for this to be the best meal since her admission. ? ?Created space and opportunity for patient's thoughts and feelings on her current illness. [name] understands why her cath procedure will need to be postponed and she remains committed to full scope treatment. She shares that she was not updating her daughter [name] with some of the details of her care, including the planned cath, and she wants to take this time to catch her up.  ? ?Discussed the importance of ongoing conversation with family regarding overall plan of care and treatment options, prioritizing patient values and goals of care. She agrees to this PA calling [name] again to provide support. Attempted to call patient's daughter but was unable to reach, left a voicemail with contact information. ? ?Questions and concerns addressed. PMT will continue to support holistically. ? ?Length of Stay: 8 ? ?Current Medications: ?Scheduled Meds:  ? amiodarone  150 mg Intravenous Once  ? Chlorhexidine Gluconate Cloth  6 each Topical Q0600  ? dorzolamide-timolol  1 drop Both Eyes BID  ? isosorbide mononitrate  30 mg Oral Daily  ? latanoprost  1 drop Both Eyes QHS  ? metoprolol succinate  200 mg Oral Daily  ? pantoprazole  40 mg Oral Daily  ? psyllium  1 packet Oral Daily  ? sodium chloride flush  3 mL Intravenous  Q12H  ? tamsulosin  0.4 mg Oral Daily  ? ? ? ?PRN Meds: ?sodium chloride, acetaminophen, albuterol, diclofenac Sodium, senna, sodium chloride flush ? ?Physical Exam ?Vitals and nursing note reviewed.  ?Constitutional:   ?   General: She is not in acute distress. ?   Comments: Elderly, frail appearing  ?Cardiovascular:  ?   Rate and Rhythm: Tachycardia present. Rhythm irregular.  ?Pulmonary:  ?   Effort: Pulmonary effort is normal.  ?Skin: ?   General: Skin is warm and dry.  ?Neurological:  ?   Mental  Status: She is alert and oriented to person, place, and time.  ?Psychiatric:     ?   Mood and Affect: Mood normal.  ?         ? ?Vital Signs: BP 113/83 (BP Location: Left Arm)   Pulse (!) 112   Temp (!) 97.5 ?F (36.4 ?C) (Oral)   Resp 18   Ht 5\' 8"  (1.727 m)   Wt 72.7 kg   SpO2 100%   BMI 24.37 kg/m?  ?SpO2: SpO2: 100 % ?O2 Device: O2 Device: Room Air ?O2 Flow Rate: O2 Flow Rate (L/min): 2 L/min ? ?Intake/output summary:  ?Intake/Output Summary (Last 24 hours) at 03/28/2021 1025 ?Last data filed at 03/28/2021 0758 ?Gross per 24 hour  ?Intake 472.03 ml  ?Output 1050 ml  ?Net -577.97 ml  ? ? ?LBM: Last BM Date : 03/26/21 ?Baseline Weight: Weight: 79.7 kg ?Most recent weight: Weight: 72.7 kg ? ?     ?Palliative Assessment/Data: 50% ? ? ? ? ? ?Patient Active Problem List  ? Diagnosis Date Noted  ? Acute renal failure ([place])   ? Acute on chronic HFrEF (heart failure with reduced ejection fraction) ([place])   ? Delirium   ? Acute respiratory failure with hypoxia ([place])   ? Unstable angina ([place]) 07/25/2020  ? Anemia 04/25/2020  ? Anorectal disorder 04/25/2020  ? Bloating 04/25/2020  ? Diarrhea 04/25/2020  ? Diverticular disease of colon 04/25/2020  ? Gastro-esophageal reflux disease without esophagitis 04/25/2020  ? Acute congestive heart failure ([place]) 04/25/2020  ? Heartburn 04/25/2020  ? Hematochezia 04/25/2020  ? Neck pain 04/25/2020  ? Other specified symptoms and signs involving the digestive system and abdomen 04/25/2020   ? Overactive bladder 04/25/2020  ? Personal history of colonic polyps 04/25/2020  ? Polyp of colon 04/25/2020  ? Spinal stenosis 04/25/2020  ? Weakness 04/25/2020  ? Leg pain 01/24/2020  ? Cirrhosis of liver ([place]) 06/22/2019  ? H/O right hemicolectomy 06/16/2018  ? Abnormal CT scan, liver 06/16/2018  ? Urge incontinence 01/08/2018  ? High risk social situation 06/27/2015  ? Chronic atrial fibrillation (HCC)   ? CKD (chronic kidney disease), stage III ([place]) 05/31/2015  ? Hx of Tibial plateau fracture 2017 05/22/2015  ? Goals of care, counseling/discussion 02/15/2013  ? Long term current use of anticoagulant therapy 05/03/2010  ? RBBB 02/16/2008  ? VENTRICULAR TACHYCARDIA 02/16/2008  ? History of mitral valve replacement - St Jude mechanical valve 02/16/2008  ? Hypertension 03/19/2006  ? Atrial fibrillation with RVR ([place]) 03/19/2006  ? RHINITIS, ALLERGIC 03/19/2006  ? Osteoarthrosis involving lower leg 03/19/2006  ? Osteoporosis 03/19/2006  ? ? ?Palliative Care Assessment & Plan  ? ?Patient Profile: ?86 y.o. female  with past medical history of  HFpEF, s/p mechanical mitral valve on coumadin, permanent a fib, HTN, CKD stage III, osteoporosis, history of right hemicolectomy, cirrhosis, GERDadmitted on 03/20/2021 with dyspnea on exertion and chest pain.  ?  ?Patient is in acute hypoxic respiratory failure secondary to new HFrEF, with acute renal failure on CKD stage 3a. Possible component of bladder outlet obstruction, as foley placed 3/4 with UOP of 1.7 to 5L. Patient's Cr and respiratory status are improving. PMT has been consulted to assist with goals of care conversation. ? ?Assessment: ?Goals of care conversation ?Acute on chronic combined HF ?AKI on CKD3 ? ?Recommendations/Plan: ?Full code/full scope treatment ?Patient has been referred to outpatient palliative care services ?PMT will continue to follow as needed ? ?Prognosis: ? Unable to determine ? ?Discharge Planning: ?Home  with Palliative Services ? ? ? ?Total  time: ?I spent 35 minutes in the care of the patient today in the above activities and documenting the encounter. ? ? ?Dorthy Cooler, PA-C ?Palliative Medicine Team ?Team phone # [phone] ? ?Thank you for allowing the Palliative Medicine Team to assist in the care of this patient. Please utilize secure chat with additional questions, if there is no response within 30 minutes please call the above phone number. ? ?Palliative Medicine Team providers are available by phone from 7am to 7pm daily and can be reached through the team cell phone.  ?Should this patient require assistance outside of these hours, please call the patient's attending physician.  ? ? ? ?

## 2021-03-28 NOTE — Progress Notes (Addendum)
0002-Paged MD with Family Medicine about pt having low SBP's in upper to 90's with a current result of 99/67. Informed MD that scheduled doses of Amiodarone 400 mg and Hydralazine 50 mg PO were not given to pt. Dr. Joyce Gross with cardiology also notified about pt as well. Pt is A/O x4 with no complaints.  ? ?21- Received callback from MD from [facility]. No new orders received at this time. Discussed new medications added to pt's regimen with MD. Still awaiting call back for cardiology's input. Pt will be monitored very closely. ? ?0040- Pt's SBP improving with current BP 101/75. Will continue to monitor's pt's BP's closely.   ?

## 2021-03-28 NOTE — Progress Notes (Signed)
ANTICOAGULATION CONSULT NOTE - Follow Up Consult ? ?Pharmacy Consult for warfarin>heparin ?Indication:  mechanical mitral valve ? ?Allergies  ?Allergen Reactions  ? Iodinated Contrast Media Rash  ?  Pt stated in the past had broken out in a rash on back and itching.   ? Esomeprazole Magnesium   ?  REACTION: stomach upset, nausea  ? ? ?Patient Measurements: ?Height: 5\' 8"  (172.7 cm) ?Weight: 73.5 kg (162 lb 0.6 oz) ?IBW/kg (Calculated) : 63.9 ?Heparin Dosing Weight: 73.1 kg ? ?Vital Signs: ?Temp: 97.7 ?F (36.5 ?C) (03/08 2237) ?Temp Source: Oral (03/08 2237) ?BP: 97/69 (03/08 2350) ?Pulse Rate: 100 (03/08 2237) ? ?Labs: ?Recent Labs  ?  03/25/21 ?0307 03/26/21 ?0309 03/27/21 ?05/27/21 03/27/21 ?0259 03/27/21 ?1404 03/27/21 ?2221  ?HGB 10.6* 11.8*  --  12.3  --   --   ?HCT 35.1* 39.3  --  40.4  --   --   ?PLT 183 182  --  201  --   --   ?LABPROT 37.8* 27.5*  --  21.7*  --   --   ?INR 3.9* 2.6*  --  1.9*  --   --   ?HEPARINUNFRC  --   --  0.12*  --  0.71* 0.65  ?CREATININE 2.86* 2.11*  --  1.87*  --   --   ? ? ? ?Estimated Creatinine Clearance: 23 mL/min (A) (by C-G formula based on SCr of 1.87 mg/dL (H)). ? ? ?Assessment: ?86 yo F with a history of mechanical mitral valve replacement/PAF, on long-term anticoagulation with warfarin. PTA warfarin dosing: 5 mg Sun/Tues/Thurs, 10 mg all other days (last dose of warfarin 3/1). Pharmacy consulted for heparin dosing.  ?  ?INR subtherapeutic at 1.9 3/8 with plans for [facility] 3/9. Initial heparin level subtherapeutic at 0.12 on 1,000 units/hr. Heparin rate was increased to 1,200 units/hr, which resulted in a supratherapeutic level of 0.71. No issues with infusion or lab draw per RN/phlebotomy. RN is reporting some blood oozing from around the IV site where heparin is running. CBC stable/WNL.  ? ?3/9 AM update:  ?Heparin level therapeutic  ? ? ?Goal of Therapy:  ?Heparin level 0.3-0.7 units/ml ?Monitor platelets by anticoagulation protocol: Yes ?  ?Plan:  ?Cont heparin at 1100  units/hr ?Heparin level with AM labs ? ?5/9, PharmD, BCPS ?Clinical Pharmacist ?Phone: [phone] ? ? ? ?

## 2021-03-28 NOTE — Progress Notes (Signed)
FPTS Interim Night Progress Note ? ?S:Patient sleeping comfortably.  Rounded with primary night RN.   No orders required.   ? ?O: ?Today's Vitals  ? 03/27/21 2350 03/28/21 0040 03/28/21 0130 03/28/21 0235  ?BP: 97/69 101/75 109/74 121/85  ?Pulse:      ?Resp:      ?Temp:      ?TempSrc:      ?SpO2:      ?Weight:      ?Height:      ?PainSc:      ? ? ? ? ?A/P: ?Continue current management ? ?Dana Allan MD ?PGY-3, [facility] ?Service pager [phone]   ?

## 2021-03-28 NOTE — Progress Notes (Signed)
ANTICOAGULATION CONSULT NOTE - Follow Up Consult ? ?Pharmacy Consult for warfarin>heparin ?Indication:  mechanical mitral valve ? ?Allergies  ?Allergen Reactions  ? Iodinated Contrast Media Rash  ?  Pt stated in the past had broken out in a rash on back and itching.   ? Esomeprazole Magnesium   ?  REACTION: stomach upset, nausea  ? ? ?Patient Measurements: ?Height: 5\' 8"  (172.7 cm) ?Weight: 72.7 kg (160 lb 4.4 oz) ?IBW/kg (Calculated) : 63.9 ?Heparin Dosing Weight: 73.1 kg ? ?Vital Signs: ?Temp: 97.9 ?F (36.6 ?C) (03/09 04-29-1980) ?Temp Source: Oral (03/09 04-29-1980) ?BP: 107/77 (03/09 0618) ?Pulse Rate: 108 (03/09 0618) ? ?Labs: ?Recent Labs  ?  03/26/21 ?0309 03/27/21 ?05/27/21 03/27/21 ?0259 03/27/21 ?1404 03/27/21 ?2221 03/28/21 ?0252  ?HGB 11.8*  --  12.3  --   --  11.1*  ?HCT 39.3  --  40.4  --   --  35.7*  ?PLT 182  --  201  --   --  211  ?LABPROT 27.5*  --  21.7*  --   --  21.7*  ?INR 2.6*  --  1.9*  --   --  1.9*  ?HEPARINUNFRC  --    < >  --  0.71* 0.65 0.44  ?CREATININE 2.11*  --  1.87*  --   --  2.37*  ? < > = values in this interval not displayed.  ? ? ? ?Estimated Creatinine Clearance: 18.1 mL/min (A) (by C-G formula based on SCr of 2.37 mg/dL (H)). ? ? ?Assessment: ?87 yo F with a history of mechanical mitral valve replacement/PAF, on long-term anticoagulation with warfarin. PTA warfarin dosing: 5 mg Sun/Tues/Thurs, 10 mg all other days (last dose of warfarin 3/1). Pharmacy consulted for heparin dosing.  ?  ?Was scheduled for [facility] 3/9, however patient experienced rise in Scr overnight and cath postponed. Heparin level therapeutic x2 (0.65, 0.44) with heparin running at 1,100 units/hour. No issues with infusion per RN. RN reported some blood oozing from around the IV site where heparin is running 3/8, but blood appears dry without continued oozing 3/9. CBC stable.  ? ?Of note, patient has been started on amiodarone for rhythm control while inpatient. She should have close outpatient follow-up with [facility]  to ensure appropriate warfarin dosing as amiodarone can increase INR and patient has been stable on her home regimen for many years.  ? ?Goal of Therapy:  ?Heparin level 0.3-0.7 units/ml ?Monitor platelets by anticoagulation protocol: Yes ?  ?Plan:  ?Continue heparin infusion at 1,100 units/hr ?Daily heparin level  ?Continue to monitor H&H and platelets ? ?5/9, PharmD ?PGY-1 Acute Care Resident  ?03/28/2021 6:47 AM  ? ? ? ?

## 2021-03-28 NOTE — Progress Notes (Signed)
? ?Progress Note ? ?[name] ?Date of Encounter: 03/29/2021 ? ?[facility] Cardiologist: Cristopher Peru, MD  ? ?Subjective  ? ?Feeling okay. Ambulating the hallways and states her dyspnea is improved. Orthopnea improved since admission. No chest pain. ? ?Had soft blood pressures yesterday and was more fatigued. Given 250cc IVF ?Cr rising today, WBC up to 14.3; hemoglobin 9.7 ?Ucx positive for GNR ? ?Inpatient Medications  ?  ?Scheduled Meds: ? Chlorhexidine Gluconate Cloth  6 each Topical Q0600  ? dorzolamide-timolol  1 drop Both Eyes BID  ? latanoprost  1 drop Both Eyes QHS  ? metoprolol succinate  50 mg Oral Daily  ? pantoprazole  40 mg Oral Daily  ? sodium chloride flush  3 mL Intravenous Q12H  ? tamsulosin  0.4 mg Oral Daily  ? ?Continuous Infusions: ? sodium chloride    ? amiodarone 30 mg/hr (03/29/21 0904)  ? heparin 1,100 Units/hr (03/29/21 0540)  ? ?PRN Meds: ?sodium chloride, acetaminophen, albuterol, diclofenac Sodium, senna, sodium chloride flush  ? ?Vital Signs  ?  ?Vitals:  ? 03/28/21 0740 03/28/21 1943 03/29/21 0507 03/29/21 [id number]  ?BP: 113/83 111/64 132/80 (!) 140/95  ?Pulse: (!) 112 98 90 96  ?Resp: 18 18 18 17   ?Temp: (!) 97.5 ?F (36.4 ?C) 97.6 ?F (36.4 ?C) 97.9 ?F (36.6 ?C) (!) 97.5 ?F (36.4 ?C)  ?TempSrc: Oral Oral Oral Oral  ?SpO2: 100% 98% 99% 99%  ?Weight:      ?Height:      ? ? ?Intake/Output Summary (Last 24 hours) at 03/29/2021 0914 ?Last data filed at 03/29/2021 0840 ?Gross per 24 hour  ?Intake 1123.05 ml  ?Output 150 ml  ?Net 973.05 ml  ? ?Last 3 Weights 03/28/2021 03/27/2021 03/26/2021  ?Weight (lbs) 160 lb 4.4 oz 162 lb 0.6 oz 161 lb 3.2 oz  ?Weight (kg) 72.7 kg 73.5 kg 73.12 kg  ?   ? ?Telemetry  ?  ?Afib with rates better controlled 80-110s- Personally Reviewed ? ?ECG  ?  ?No new tracing today ? ?Physical Exam  ? ?GEN: Sitting up in a chair, comfortable ?Neck: No JVD ?Cardiac: Irregularly irregular, +mechanical click ?Respiratory: Clear to auscultation bilaterally. ?GI:  Soft, nontender, non-distended  ?MS: No edema; No deformity.  ?Neuro:  Nonfocal  ?Psych: Normal affect  ? ?Labs  ?  ?High Sensitivity Troponin:   ?Recent Labs  ?Lab 03/20/21 ?1340 03/20/21 ?1647  ?TROPONINIHS 14 14  ?   ?Chemistry ?Recent Labs  ?Lab 03/23/21 ?[id number] 03/24/21 ?0109 03/25/21 ?[id number] 03/26/21 ?0309 03/27/21 ?[id number] 03/28/21 ?[id number] 03/29/21 ?0214  ?NA 139 138 140   < > 140 139 138  ?K 4.1 3.5 3.5   < > 4.6 4.0 3.7  ?CL 104 103 107   < > 105 107 108  ?CO2 22 24 24    < > 24 23 21*  ?GLUCOSE 114* 110* 97   < > 109* 125* 109*  ?BUN 59* 63* 56*   < > 37* 46* 63*  ?CREATININE 5.37* 4.34* 2.86*   < > 1.87* 2.37* 2.94*  ?CALCIUM 9.3 9.1 9.0   < > 9.3 8.9 8.5*  ?MG 2.4  --   --   --   --   --   --   ?PROT  --   --  6.6  --   --   --   --   ?ALBUMIN  --  3.2* 2.9*  --   --   --   --   ?AST  --   --  18  --   --   --   --   ?ALT  --   --  15  --   --   --   --   ?ALKPHOS  --   --  7  --   --   --   --   ?BILITOT  --   --  0.9  --   --   --   --   ?GFRNONAA 7* 10* 16*   < > 26* 20* 15*  ?ANIONGAP 13 11 9    < > 11 9 9   ? < > = values in this interval not displayed.  ?  ?Lipids No results for input(s): CHOL, TRIG, HDL, LABVLDL, LDLCALC, CHOLHDL in the last 168 hours.  ?Hematology ?Recent Labs  ?Lab 03/27/21 ?0259 03/28/21 ?0252 03/29/21 ?0214  ?WBC 8.5 11.5* 14.3*  ?RBC 4.54 4.04 3.62*  ?HGB 12.3 11.1* 9.7*  ?HCT 40.4 35.7* 32.3*  ?MCV 89.0 88.4 89.2  ?MCH 27.1 27.5 26.8  ?MCHC 30.4 31.1 30.0  ?RDW 15.5 15.5 15.4  ?PLT 201 211 227  ? ?Thyroid No results for input(s): TSH, FREET4 in the last 168 hours.  ?BNP ?No results for input(s): BNP, PROBNP in the last 168 hours.  ?DDimer No results for input(s): DDIMER in the last 168 hours.  ? ?Radiology  ?  ?No results found. ? ?Cardiac Studies  ? ?TTE 03/21/21: ?IMPRESSIONS  ? 1. Left ventricular ejection fraction, by estimation, is 35 to 40%. The  ?left ventricle has moderately decreased function. The left ventricle  ?demonstrates global hypokinesis. There is moderate left  ventricular  ?hypertrophy. Left ventricular diastolic  ?function could not be evaluated.  ? 2. Right ventricular systolic function is mildly reduced. The right  ?ventricular size is normal.  ? 3. Left atrial size was Massively dilated at 126 ml/m2.  ? 4. Right atrial size was severely dilated.  ? 5. The mitral valve has been repaired/replaced. Trivial mitral valve  ?regurgitation. No evidence of mitral stenosis. There is a St. Jude  ?mechanical valve present in the mitral position. Procedure Date: 70.  ?Echo findings are consistent with normal  ?structure and function of the mitral valve prosthesis.  ? 6. The aortic valve is calcified. Aortic valve regurgitation is not  ?visualized. Mild to moderate aortic valve stenosis. Aortic valve area, by  ?VTI measures 1.48 cm?Marland Kitchen Aortic valve mean gradient measures 8.0 mmHg.  ?Aortic valve Vmax measures 1.94 m/s.  ? ?Comparison(s): Changes from prior study are noted. 07/25/2020: LVEF 50-55%. ? ?Patient Profile  ?   ?86 y.o. female with PMH of chronic diastolic CHF, chronic atrial fibrillation on coumadin, St Jude mechanical MVR 1987, mild AS, NSVT, RBBB, HTN, CKD stage III, GERD and chronic anemia who presented on 3/1 with worsening dyspnea for 3 month. BNP elevated at 1633 ? ?Assessment & Plan  ?  ?#Acute on chronic combined heart failure: ?Newly diagnosed on this admission. TTE 03/21/21 with LVEF 35-40%, global hypokinesis, moderate LVH, mild RV systolic dysfunction, severe biatrial enlargement, mechanical MVR with normal function, mild-to-moderate AS. Lasix held due to worsening renal function with concern for possible obstruction given brisk UOP after foley placement. No hydronephrosis on ultrasound.  Renal function initially improved but now rising again.  ?- Will defer RHC/LHC for now given rise in Cr ?- Holding imdur due to soft blood pressures ?- Hold hydralazine given soft blood pressures overnight ?- Will check BNP; does not appear grossly overloaded on exam ?-  Continue metop 50mg   XL daily ?- Cannot add ACE/ARNI/ARB or spiro due to renal dysfunction; will add as able ?- Monitor I/Os and daily weights ?- Low Na diet ? ?#ARF CKD IIIa ?Developed significant AKI that was more rapid than would have been expected than with diuresis alone earlier during hospitalization. Suspect element of bladder outlet obstruction given brisk diuresis after foley placement. Renal ultrasound with no hydronephrosis. Initially improved but now rising again. Possibly acute rise due to Afib with RVR and relatively soft blood pressures as well as possible UTI. Does not appear overtly overloaded but will check BNP ?-No cath today ?-Will manage Afib as below ?-Holding hydralazine and imdur due to soft pressures ?-Check BNP ?-Management of suspected UTI per primary ? ?#Saint Jude mechanical mitral valve 1987: ?TTE with normal functioning mechanical valve. ?- Routine follow-up as outpatient ?- Warfarin held given plans for possible LHC/RHC ?- Continue heparin gtt for now ? ?#Persistent atrial fibrillation: ?In Afib with RVR currently. ?-Continue amiodarone gtt ?-Hold PO while on IV ?-Continue metop 50mg  XL daily ?-Warfarin held given plans for possible LHC/RHC once Cr improved ?-Continue heparin gtt for now ? ?#Mild-to-moderate AS: ?-Will need routine follow-up as out-patient ? ?#HTN: ?Soft yesterday. Now improving ?- Holding imdur ?- Hold hydralazine given soft blood pressures overnight ?- Continue metop 50mg  XL daily ? ?#Suspected UTI: ?May be contributing to AKI in addition to above.  ?-Management per primary ? ?For questions or updates, please contact [place] ?Please consult [email or web address] for contact info under  ? ?  ?   ?Signed, ?Freada Bergeron, MD  ?03/29/2021, 9:14 AM   ? ?

## 2021-03-28 NOTE — Progress Notes (Signed)
?   03/28/21 1500  ?Mobility  ?Activity Contraindicated/medical hold ?(Per RN BP soft. Hold)  ? ? ?Will check back as time permits ?

## 2021-03-28 NOTE — Care Management Important Message (Signed)
Important Message ? ?Patient Details  ?Name: [name] ?MRN: [id number] ?Date of Birth: 12-22-1935 ? ? ?Medicare Important Message Given:  Yes ? ? ? ? ?Shelda Altes ?03/28/2021, 9:07 AM ?

## 2021-03-29 ENCOUNTER — Encounter (HOSPITAL_COMMUNITY)
Admission: EM | Disposition: A | Discharge status: Home Health | Payer: Self-pay | Source: Ambulatory Visit | Attending: Family Medicine

## 2021-03-29 DIAGNOSIS — I5021 Acute systolic (congestive) heart failure: Secondary | ICD-10-CM | POA: Diagnosis not present

## 2021-03-29 DIAGNOSIS — I4891 Unspecified atrial fibrillation: Secondary | ICD-10-CM | POA: Diagnosis not present

## 2021-03-29 LAB — BASIC METABOLIC PANEL
Anion gap: 9 (ref 5–15)
BUN: 63 mg/dL — ABNORMAL HIGH (ref 8–23)
CO2: 21 mmol/L — ABNORMAL LOW (ref 22–32)
Calcium: 8.5 mg/dL — ABNORMAL LOW (ref 8.9–10.3)
Chloride: 108 mmol/L (ref 98–111)
Creatinine, Ser: 2.94 mg/dL — ABNORMAL HIGH (ref 0.44–1.00)
GFR, Estimated: 15 mL/min — ABNORMAL LOW (ref >60)
Glucose, Bld: 109 mg/dL — ABNORMAL HIGH (ref 70–99)
Potassium: 3.7 mmol/L (ref 3.5–5.1)
Sodium: 138 mmol/L (ref 135–145)

## 2021-03-29 LAB — CBC
HCT: 32.3 % — ABNORMAL LOW (ref 36.0–46.0)
Hemoglobin: 9.7 g/dL — ABNORMAL LOW (ref 12.0–15.0)
MCH: 26.8 pg (ref 26.0–34.0)
MCHC: 30 g/dL (ref 30.0–36.0)
MCV: 89.2 fL (ref 80.0–100.0)
Platelets: 227 10*3/uL (ref 150–400)
RBC: 3.62 MIL/uL — ABNORMAL LOW (ref 3.87–5.11)
RDW: 15.4 % (ref 11.5–15.5)
WBC: 14.3 10*3/uL — ABNORMAL HIGH (ref 4.0–10.5)
nRBC: 0 % (ref 0.0–0.2)

## 2021-03-29 LAB — BRAIN NATRIURETIC PEPTIDE: B Natriuretic Peptide: 513.6 pg/mL — ABNORMAL HIGH (ref 0.0–100.0)

## 2021-03-29 LAB — HEPARIN LEVEL (UNFRACTIONATED): Heparin Unfractionated: 0.55 IU/mL (ref 0.30–0.70)

## 2021-03-29 SURGERY — RIGHT/LEFT HEART CATH AND CORONARY ANGIOGRAPHY
Anesthesia: LOCAL

## 2021-03-29 MED ORDER — CEPHALEXIN 500 MG PO CAPS
500.0000 mg | ORAL_CAPSULE | Freq: Two times a day (BID) | ORAL | Status: AC
  Administered 2021-03-29 – 2021-04-02 (×10): 500 mg via ORAL
  Filled 2021-03-29 (×10): qty 1

## 2021-03-29 MED ORDER — POLYETHYLENE GLYCOL 3350 17 G PO PACK
17.0000 g | PACK | Freq: Once | ORAL | Status: AC
Start: 2021-03-29 — End: 2021-03-29
  Administered 2021-03-29: 17 g via ORAL
  Filled 2021-03-29: qty 1

## 2021-03-29 MED ORDER — ENSURE ENLIVE PO LIQD
237.0000 mL | Freq: Two times a day (BID) | ORAL | Status: DC
  Administered 2021-03-29 – 2021-04-03 (×7): 237 mL via ORAL

## 2021-03-29 MED ORDER — ADULT MULTIVITAMIN W/MINERALS CH
1.0000 | ORAL_TABLET | Freq: Every day | ORAL | Status: DC
  Administered 2021-03-29 – 2021-04-03 (×6): 1 via ORAL
  Filled 2021-03-29 (×6): qty 1

## 2021-03-29 MED ORDER — POLYETHYLENE GLYCOL 3350 17 G PO PACK: 17.0000 g | PACK | Freq: Every day | ORAL | Status: DC

## 2021-03-29 NOTE — Progress Notes (Signed)
? ?                                                                                                                                                     ?                                                   ?Daily Progress Note  ? ?[name]       Date: 03/29/2021 ?DOB: 1935/04/19  Age: 86 y.o. MRN#: [id number] ?Attending Physician: Kinnie Feil, MD ?Primary Care Physician: Dickie La, MD ?Admit Date: 03/20/2021 ? ?Reason for Consultation/Follow-up: Establishing goals of care and Psychosocial/spiritual support ? ?Subjective: ?Medical records reviewed. Patient assessed at the bedside. She is in her bedside chair, feeling better each day. ? ?Discussed patient's AKI in detail, considering best case and worse case scenarios and her thoughts on aggressive interventions such as dialysis if ever indicated. [name] would not want this - she feels this would cause her great suffering for a couple years of survival in return, which would not be acceptable to her. We reviewed the risks and benefits, considering her comorbidities, and the importance of sharing these wishes with her family. She tells me her daughter has her own health issues and she has advised her not to visit until after the cath.  ? ?[name] remains hopeful for continued improvement, while also acknowledging that she has lived a long 52 years and her chronic conditions may continue to progress despite interventions. We discussed her short term and long term goals, including visiting the beach and returning home to do her favorite activities. She tells me it upsets her to discuss things like dialysis, CPR, mechanical ventilation as she feels better when she "puts that aside," however she recognizes the importance of these ongoing conversations.  ? ?Questions and concerns addressed. PMT will continue to support holistically. ? ?Length of Stay: 9 ? ?Current Medications: ?Scheduled Meds:  ? cephALEXin  500 mg Oral Q12H  ? Chlorhexidine Gluconate  Cloth  6 each Topical Q0600  ? dorzolamide-timolol  1 drop Both Eyes BID  ? feeding supplement  237 mL Oral BID BM  ? latanoprost  1 drop Both Eyes QHS  ? metoprolol succinate  50 mg Oral Daily  ? multivitamin with minerals  1 tablet Oral Daily  ? pantoprazole  40 mg Oral Daily  ? sodium chloride flush  3 mL Intravenous Q12H  ? tamsulosin  0.4 mg Oral Daily  ? ? ? ?PRN Meds: ?sodium chloride, acetaminophen, albuterol, diclofenac Sodium, senna, sodium chloride flush ? ?Physical Exam ?Vitals and nursing note reviewed.  ?Constitutional:   ?   General: She is not  in acute distress. ?   Comments: Elderly, frail appearing  ?Cardiovascular:  ?   Rate and Rhythm: Normal rate.  ?Pulmonary:  ?   Effort: Pulmonary effort is normal.  ?Skin: ?   General: Skin is warm and dry.  ?Neurological:  ?   Mental Status: She is alert and oriented to person, place, and time.  ?Psychiatric:     ?   Mood and Affect: Mood normal.  ?         ? ?Vital Signs: BP 116/85   Pulse 96   Temp (!) 97.5 ?F (36.4 ?C) (Oral)   Resp 17   Ht 5\' 8"  (1.727 m)   Wt 74.1 kg   SpO2 99%   BMI 24.83 kg/m?  ?SpO2: SpO2: 99 % ?O2 Device: O2 Device: Room Air ?O2 Flow Rate: O2 Flow Rate (L/min): 2 L/min ? ?Intake/output summary:  ?Intake/Output Summary (Last 24 hours) at 03/29/2021 1330 ?Last data filed at 03/29/2021 1325 ?Gross per 24 hour  ?Intake 1123.05 ml  ?Output 225 ml  ?Net 898.05 ml  ? ? ?LBM: Last BM Date : 03/26/21 ?Baseline Weight: Weight: 79.7 kg ?Most recent weight: Weight: 74.1 kg ? ?     ?Palliative Assessment/Data: 50-60% ? ? ? ? ? ?Patient Active Problem List  ? Diagnosis Date Noted  ? Acute renal failure ([place])   ? Acute on chronic HFrEF (heart failure with reduced ejection fraction) ([place])   ? Delirium   ? Acute respiratory failure with hypoxia ([place])   ? Unstable angina ([place]) 07/25/2020  ? Anemia 04/25/2020  ? Anorectal disorder 04/25/2020  ? Bloating 04/25/2020  ? Diarrhea 04/25/2020  ? Diverticular disease of colon 04/25/2020  ?  Gastro-esophageal reflux disease without esophagitis 04/25/2020  ? Acute congestive heart failure ([place]) 04/25/2020  ? Heartburn 04/25/2020  ? Hematochezia 04/25/2020  ? Neck pain 04/25/2020  ? Other specified symptoms and signs involving the digestive system and abdomen 04/25/2020  ? Overactive bladder 04/25/2020  ? Personal history of colonic polyps 04/25/2020  ? Polyp of colon 04/25/2020  ? Spinal stenosis 04/25/2020  ? Weakness 04/25/2020  ? Leg pain 01/24/2020  ? Cirrhosis of liver ([place]) 06/22/2019  ? H/O right hemicolectomy 06/16/2018  ? Abnormal CT scan, liver 06/16/2018  ? Urge incontinence 01/08/2018  ? High risk social situation 06/27/2015  ? Chronic atrial fibrillation (H[facility])   ? CKD (chronic kidney disease), stage III ([place]) 05/31/2015  ? Hx of Tibial plateau fracture 2017 05/22/2015  ? Goals of care, counseling/discussion 02/15/2013  ? Long term current use of anticoagulant therapy 05/03/2010  ? RBBB 02/16/2008  ? VENTRICULAR TACHYCARDIA 02/16/2008  ? History of mitral valve replacement - St Jude mechanical valve 02/16/2008  ? Hypertension 03/19/2006  ? Atrial fibrillation with RVR ([place]) 03/19/2006  ? RHINITIS, ALLERGIC 03/19/2006  ? Osteoarthrosis involving lower leg 03/19/2006  ? Osteoporosis 03/19/2006  ? ? ?Palliative Care Assessment & Plan  ? ?Patient Profile: ?86 y.o. female  with past medical history of  HFpEF, s/p mechanical mitral valve on coumadin, permanent a fib, HTN, CKD stage III, osteoporosis, history of right hemicolectomy, cirrhosis, GERDadmitted on 03/20/2021 with dyspnea on exertion and chest pain.  ?  ?Patient is in acute hypoxic respiratory failure secondary to new HFrEF, with acute renal failure on CKD stage 3a. Possible component of bladder outlet obstruction, as foley placed 3/4 with UOP of 1.7 to 5L. Patient's Cr and respiratory status are improving. PMT has been consulted to assist with goals of care conversation. ? ?  Assessment: ?Goals of care conversation ?Acute on chronic combined  HF ?AKI on CKD3 ? ?Recommendations/Plan: ?Full code/full scope treatment ?Goals of care documented on Vynca ?Patient would not want dialysis in the future ?Patient has outpatient palliative care referral ?PMT will continue to follow peripherally ? ?Prognosis: ? Unable to determine ? ?Discharge Planning: ?Home with Palliative Services ? ? ?MDM: High ? ?Dorthy Cooler, PA-C ?Palliative Medicine Team ?Team phone # [phone] ? ?Thank you for allowing the Palliative Medicine Team to assist in the care of this patient. Please utilize secure chat with additional questions, if there is no response within 30 minutes please call the above phone number. ? ?Palliative Medicine Team providers are available by phone from 7am to 7pm daily and can be reached through the team cell phone.  ?Should this patient require assistance outside of these hours, please call the patient's attending physician.  ? ? ?

## 2021-03-29 NOTE — Progress Notes (Addendum)
Physical Therapy Treatment ?Patient Details ?Name: [name] ?MRN: [id number] ?DOB: 1935-01-22 ?Today's Date: 03/29/2021 ? ? ?History of Present Illness 86 yo admitted 3/1 with DOE and CP. Pt with acute hypoxic respiratory failure secondary to new HFrEF, with acute renal failure.  PMHx:HFpEF, s/p MVR, permanent Afib, HTN, CKD stage III, osteoporosis, Rt hemicolectomy, cirrhosis, GERD ? ?  ?PT Comments  ? ? Pt with increased gait tolerance with HR slightly improved this session. Pt with posterior LOB last session and did not exhibit during gait today. Educated for post cath precautions and HEP with continued mobility with staff recommended as well as appropriate D/C plan. Will continue to follow.  ? ?BP pre gait 121/84, post gait 129/92 ?Sats 93% on RA ?[id number] ?  ?Recommendations for follow up therapy are one component of a multi-disciplinary discharge planning process, led by the attending physician.  Recommendations may be updated based on patient status, additional functional criteria and insurance authorization. ? ?Follow Up Recommendations ? Home health PT ?  ?  ?Assistance Recommended at Discharge Intermittent Supervision/Assistance  ?Patient can return home with the following Assistance with cooking/housework;Assist for transportation;A little help with walking and/or transfers ?  ?Equipment Recommendations ? None recommended by PT  ?  ?Recommendations for Other Services   ? ? ?  ?Precautions / Restrictions Precautions ?Precautions: Fall ?Precaution Comments: watch HR ?Restrictions ?Weight Bearing Restrictions: No  ?  ? ?Mobility ? Bed Mobility ?Overal bed mobility: Modified Independent ?  ?  ?  ?  ?  ?  ?General bed mobility comments: increased time with bed flat ?  ? ?Transfers ?Overall transfer level: Needs assistance ?  ?Transfers: Sit to/from Stand ?Sit to Stand: Min guard ?  ?  ?  ?  ?  ?General transfer comment: cues for hand placement with decreased control to chair with cues not to sit as.  Repeated trial with single UE use with cues and good control to practice post cath restrictions ?  ? ?Ambulation/Gait ?Ambulation/Gait assistance: Min guard ?Gait Distance (Feet): 280 Feet ?Assistive device: Rolling walker (2 wheels) ?Gait Pattern/deviations: Step-through pattern, Decreased stride length ?  ?Gait velocity interpretation: 1.31 - 2.62 ft/sec, indicative of limited community ambulator ?  ?General Gait Details: rounded shoulders with increased gait distance with HR 120-140 during gait today without posterior LOB as was demonstrated prior session. ? ? ?Stairs ?  ?  ?  ?  ?  ? ? ?Wheelchair Mobility ?  ? ?Modified Rankin (Stroke Patients Only) ?  ? ? ?  ?Balance Overall balance assessment: Needs assistance ?Sitting-balance support: No upper extremity supported ?Sitting balance-Leahy Scale: Good ?Sitting balance - Comments: EOb without UE support ?  ?Standing balance support: Bilateral upper extremity supported, No upper extremity supported ?Standing balance-Leahy Scale: Fair ?Standing balance comment: static without UE support, RW for gait ?  ?  ?  ?  ?  ?  ?  ?  ?  ?  ?  ?  ? ?  ?Cognition Arousal/Alertness: Awake/alert ?Behavior During Therapy: [facility] for tasks assessed/performed ?Overall Cognitive Status: Within Functional Limits for tasks assessed ?  ?  ?  ?  ?  ?  ?  ?  ?  ?  ?  ?  ?  ?  ?  ?  ?General Comments: pt somewhat slow processing ?  ?  ? ?  ?Exercises General Exercises - Lower Extremity ?Long Arc Quad: AROM, Both, Seated, 20 reps ?Hip Flexion/Marching: AROM, Both, Seated, 20 reps ?Toe Raises: AROM, Both,  Seated, 20 reps ?Heel Raises: AROM, Both, Seated, 20 reps ? ?  ?General Comments   ?  ?  ? ?Pertinent Vitals/Pain Pain Assessment ?Pain Assessment: No/denies pain  ? ? ?Home Living   ?  ?  ?  ?  ?  ?  ?  ?  ?  ?   ?  ?Prior Function    ?  ?  ?   ? ?PT Goals (current goals can now be found in the care plan section) Progress towards PT goals: Progressing toward goals ? ?  ?Frequency ? ? ? Min  3X/week ? ? ? ?  ?PT Plan Current plan remains appropriate  ? ? ?Co-evaluation   ?  ?  ?  ?  ? ?  ?AM-PAC PT "6 Clicks" Mobility   ?Outcome Measure ? Help needed turning from your back to your side while in a flat bed without using bedrails?: None ?Help needed moving from lying on your back to sitting on the side of a flat bed without using bedrails?: None ?Help needed moving to and from a bed to a chair (including a wheelchair)?: A Little ?Help needed standing up from a chair using your arms (e.g., wheelchair or bedside chair)?: A Little ?Help needed to walk in hospital room?: A Little ?Help needed climbing 3-5 steps with a railing? : A Lot ?6 Click Score: 19 ? ?  ?End of Session   ?Activity Tolerance: Patient tolerated treatment well ?Patient left: in chair;with call bell/phone within reach;with chair alarm set ?Nurse Communication: Mobility status ?PT Visit Diagnosis: Other abnormalities of gait and mobility (R26.89);Muscle weakness (generalized) (M62.81) ?  ? ? ?Time: [id number] ?PT Time Calculation (min) (ACUTE ONLY): 26 min ? ?Charges:  $Gait Training: 8-22 mins ?$Therapeutic Exercise: 8-22 mins          ?          ? ?Timmy Cleverly P, PT ?Acute Rehabilitation Services ?Pager: [phone] ?Office: [phone] ? ? ? ?Criselda Starke B Lakiesha Ralphs ?03/29/2021, 9:24 AM ? ?

## 2021-03-29 NOTE — Progress Notes (Addendum)
Family Medicine Teaching Service ?Daily Progress Note ?Intern Pager: [phone] ? ?[name] Medical record number: [id number] ?Date of birth: Jul 14, 1935 Age: 86 y.o. Gender: female ? ?Primary Care Provider: Dickie La, MD ?Consultants: Cardiology, palliative ?Code Status: Full ? ?Pt Overview and Major Events to Date:  ?02/20/2021-admitted ?02/21/2021-ARF ?03/23/2021-BOO, Foley placed ?03/26/2021-Foley removed ?03/27/2021-BOO continued, Foley replaced ? ?Assessment and Plan: ?Patient is an 86 year old female who presented with dyspnea on exertion and chest pain.  PMH significant for HFpEF (now HFrEF), s/p prosthetic mitral valve on Coumadin, permanent A-fib, HTN, CKD stage III, osteoporosis, history of right hemicolectomy, GERD ? ?Acute hypoxic respiratory failure, resolved secondary to new HFrEF ?Patient is breathing comfortably on room air this morning with oxygen saturations in the high 90s.  24-hour urinary output recorded of 300 mL.  Weight is down 72.7 kg yesterday and 79.7 since admission.  Cardiology was planning for right and left heart cath due to drastically decreased EF of 35-40% however this is being held due to continued increasing creatinine. ?-Cardiology consulted ?-Metoprolol succinate 100 mg daily ?-Imdur DC'd yesterday d/t soft BPs ?-Strict I's and O's and daily weights ?-A.m. BMP ?-Palliative care consulted ? ?ARF on CKD stage III  UTI ?Patient was found to have bladder outlet obstruction on 03/23/2021 and Foley placed, creatinine initially improved but then worsened with continued bladder outlet obstruction after trial without Foley.  Foley was replaced on 03/27/2021 and UA showed moderate leuks, greater than 50 white blood cells, many bacteria with 0-5 epithelial cells.  Patient admitted to some pain with urination before Foley was placed.  Urine culture grew greater than 100,000 colonies of gram-negative rods.  White blood cells increased from 11.5 yesterday to 14.3 today.  Will treat for  UTI. Creatinine of 2.94 from 2.37 yesterday.  Will remove foley today with a voiding trial and post void bladder scans.  ?-Continue tamsulosin 0.4 mg daily ?-remove foley today ?-voiding trial with post void bladder scan ?-Strict I's and O's ?-Daily BMP ?-Avoid nephrotoxins ?-Cephalexin 500 mg twice daily for 5 days ? ?S/p mechanical mitral valve  persistent A-fib ?Patient is currently on heparin gtt. per pharmacy instead of warfarin due to plan for cardiac procedure.  Due to her right persistently been 1 teens-120s patient was started on amiodarone drip yesterday and metoprolol succinate reduced to 50 mg daily. HR this morning was in low 100's. ?-Cards following ?-Amiodarone drip ?-Metoprolol succinate 50 mg daily ? ?Hypertension ?Due to soft BPs hydralazine and imdur held. BP this morning 132/80, BPs normotensive over the last 24 hours ?-Metoprolol succinate 50 mg daily ? ?FEN/GI: Heart healthy ?PPx: Heparin ?Dispo: Home pending continued medical management ? ?Subjective:  ?Patient was sitting up in chair doing puzzles, states she is feeling well this morning, no complaints at this time. ? ?Objective: ?Temp:  [97.5 ?F (36.4 ?C)-97.9 ?F (36.6 ?C)] 97.9 ?F (36.6 ?C) (03/10 0507) ?Pulse Rate:  [90-112] 90 (03/10 0507) ?Resp:  [18] 18 (03/10 0507) ?BP: (111-132)/(64-83) 132/80 (03/10 0507) ?SpO2:  [98 %-100 %] 99 % (03/10 0507) ?Physical Exam: ?General: Sitting up in chair, pleasant to speak with, NAD ?Cardiovascular: Irregularly irregular, mildly tachycardic ?Respiratory: CTAB, normal effort ?Abdomen: Soft, nontender to palpation, nondistended ?Extremities: Trace edema BLEs ? ?Laboratory: ?Recent Labs  ?Lab 03/27/21 ?0259 03/28/21 ?0252 03/29/21 ?0214  ?WBC 8.5 11.5* 14.3*  ?HGB 12.3 11.1* 9.7*  ?HCT 40.4 35.7* 32.3*  ?PLT 201 211 227  ? ?Recent Labs  ?Lab 03/25/21 ?0307 03/26/21 ?0309 03/27/21 ?0259 03/28/21 ?  [id number] 03/29/21 ?0214  ?NA 140   < > 140 139 138  ?K 3.5   < > 4.6 4.0 3.7  ?CL 107   < > 105 107 108  ?CO2  24   < > 24 23 21*  ?BUN 56*   < > 37* 46* 63*  ?CREATININE 2.86*   < > 1.87* 2.37* 2.94*  ?CALCIUM 9.0   < > 9.3 8.9 8.5*  ?PROT 6.6  --   --   --   --   ?BILITOT 0.9  --   --   --   --   ?ALKPHOS 38  --   --   --   --   ?ALT 15  --   --   --   --   ?AST 18  --   --   --   --   ?GLUCOSE 97   < > 109* 125* 109*  ? < > = values in this interval not displayed.  ? ? ? ?Precious Gilding, DO ?03/29/2021, 7:05 AM ?PGY-1, [facility] ?[place] Intern pager: [phone], text pages welcome ? ?

## 2021-03-29 NOTE — Progress Notes (Signed)
FPTS Interim Night Progress Note ? ?S:Patient sleeping comfortably.  Rounded with primary night RN.  No concerns voiced.  No orders required.   ? ?O: ?Today's Vitals  ? 03/28/21 6295 03/28/21 0740 03/28/21 0836 03/28/21 1943  ?BP: 107/77 113/83  111/64  ?Pulse: (!) 108 (!) 112  98  ?Resp: 18 18  18   ?Temp: 97.9 ?F (36.6 ?C) (!) 97.5 ?F (36.4 ?C)  97.6 ?F (36.4 ?C)  ?TempSrc: Oral Oral  Oral  ?SpO2: 99% 100%  98%  ?Weight: 72.7 kg     ?Height:      ?PainSc:   0-No pain 0-No pain  ? ? ? ? ?A/P: ?Continue current management ? ? MD ?PGY-3, [facility] ?Service pager [phone]   ?

## 2021-03-29 NOTE — Progress Notes (Signed)
ANTICOAGULATION CONSULT NOTE - Follow Up Consult ? ?Pharmacy Consult for warfarin>heparin ?Indication:  mechanical mitral valve ? ?Allergies  ?Allergen Reactions  ? Iodinated Contrast Media Rash  ?  Pt stated in the past had broken out in a rash on back and itching.   ? Esomeprazole Magnesium   ?  REACTION: stomach upset, nausea  ? ? ?Patient Measurements: ?Height: 5\' 8"  (172.7 cm) ?Weight: 72.7 kg (160 lb 4.4 oz) ?IBW/kg (Calculated) : 63.9 ?Heparin Dosing Weight: 73.1 kg ? ?Vital Signs: ?Temp: 97.5 ?F (36.4 ?C) (03/10 [id number]) ?Temp Source: Oral (03/10 [id number]) ?BP: 116/85 (03/10 0947) ?Pulse Rate: 96 (03/10 0824) ? ?Labs: ?Recent Labs  ?  03/27/21 ?0259 03/27/21 ?1404 03/27/21 ?2221 03/28/21 ?0252 03/29/21 ?0214  ?HGB 12.3  --   --  11.1* 9.7*  ?HCT 40.4  --   --  35.7* 32.3*  ?PLT 201  --   --  211 227  ?LABPROT 21.7*  --   --  21.7*  --   ?INR 1.9*  --   --  1.9*  --   ?HEPARINUNFRC  --    < > 0.65 0.44 0.55  ?CREATININE 1.87*  --   --  2.37* 2.94*  ? < > = values in this interval not displayed.  ? ? ? ?Estimated Creatinine Clearance: 14.6 mL/min (A) (by C-G formula based on SCr of 2.94 mg/dL (H)). ? ? ?Assessment: ?86 yo F with a history of mechanical mitral valve replacement/PAF, on long-term anticoagulation with warfarin. PTA warfarin dosing: 5 mg Sun/Tues/Thurs, 10 mg all other days (last dose of warfarin 3/1). Pharmacy consulted for heparin dosing.  ?  ?Was scheduled for [facility] 3/9, however patient is experiencing continued rise in Scr and cath has been post-poned. Heparin level therapeutic at 0.55 with heparin running at 1,100 units/hour. No issues with infusion per RN. Some blood around IV site where heparin is running, but appears dry without oozing 3/10. Slight drop in Hgb overnight (11.1>>9.7), but no other signs of bleeding per patient and RN. PLT WNL/stable.   ? ?Of note, patient has been started on amiodarone for rhythm control while inpatient. She should have close outpatient follow-up with Coumadin  Clinic to ensure appropriate warfarin dosing as amiodarone can increase INR and patient has been stable on her home regimen for many years.  ? ?Goal of Therapy:  ?Heparin level 0.3-0.7 units/ml ?Monitor platelets by anticoagulation protocol: Yes ?  ?Plan:  ?Continue heparin infusion at 1,100 units/hr ?Daily heparin level  ?Continue to monitor H&H and platelets ?F/U cardiology recs  ? ?Adria Dill, PharmD ?PGY-1 Acute Care Resident  ?03/29/2021 10:23 AM  ? ? ? ?

## 2021-03-29 NOTE — Progress Notes (Signed)
Occupational Therapy Treatment ?Patient Details ?Name: [name] ?MRN: [id number] ?DOB: [birth year] ?Today's Date: 03/29/2021 ? ? ?History of present illness 86 yo admitted 3/1 with DOE and CP. Pt with acute hypoxic respiratory failure secondary to new HFrEF, with acute renal failure.  PMHx:HFpEF, s/p MVR, permanent Afib, HTN, CKD stage III, osteoporosis, Rt hemicolectomy, cirrhosis, GERD ?  ?OT comments ? Pt not feeling great, reports dizziness and nausea on BSC. Declined further activity. RN aware, had provided crackers and soda prior to session.   ? ?Recommendations for follow up therapy are one component of a multi-disciplinary discharge planning process, led by the attending physician.  Recommendations may be updated based on patient status, additional functional criteria and insurance authorization. ?   ?Follow Up Recommendations ? Home health OT  ?  ?Assistance Recommended at Discharge Intermittent Supervision/Assistance  ?Patient can return home with the following ? A little help with walking and/or transfers;A little help with bathing/dressing/bathroom;Assistance with cooking/housework;Assist for transportation;Help with stairs or ramp for entrance ?  ?Equipment Recommendations ? None recommended by OT  ?  ?Recommendations for Other Services   ? ?  ?Precautions / Restrictions Precautions ?Precautions: Fall ?Precaution Comments: watch HR  ? ? ?  ? ?Mobility Bed Mobility ?Overal bed mobility: Modified Independent ?  ?  ?  ?  ?  ?  ?  ?  ? ?Transfers ?  ?  ?  ?  ?  ?  ?  ?  ?  ?  ?  ?  ?Balance Overall balance assessment: Needs assistance ?Sitting-balance support: No upper extremity supported ?Sitting balance-Leahy Scale: Good ?  ?  ?Standing balance support: Bilateral upper extremity supported, No upper extremity supported ?Standing balance-Leahy Scale: Fair ?Standing balance comment: static without UE support, RW for gait ?  ?  ?  ?  ?  ?  ?  ?  ?  ?  ?  ?   ? ?ADL either performed or assessed with clinical  judgement  ? ?ADL Overall ADL's : Needs assistance/impaired ?  ?  ?Grooming: Wash/dry hands;Sitting;Set up ?  ?  ?  ?  ?  ?  ?  ?  ?  ?Toilet Transfer: Min guard;Ambulation;BSC/3in1;Rolling walker (2 wheels) ?  ?Toileting- Water quality scientist and Hygiene: Min guard;Sit to/from stand ?  ?  ?  ?  ?  ?  ? ?Extremity/Trunk Assessment   ?  ?  ?  ?  ?  ? ?Vision   ?  ?  ?Perception   ?  ?Praxis   ?  ? ?Cognition Arousal/Alertness: Awake/alert ?Behavior During Therapy: [facility] for tasks assessed/performed ?Overall Cognitive Status: Within Functional Limits for tasks assessed ?  ?  ?  ?  ?  ?  ?  ?  ?  ?  ?  ?  ?  ?  ?  ?  ?  ?  ?  ?   ?Exercises   ? ?  ?Shoulder Instructions   ? ? ?  ?General Comments    ? ? ?Pertinent Vitals/ Pain       Pain Assessment ?Pain Assessment: No/denies pain ? ?Home Living   ?  ?  ?  ?  ?  ?  ?  ?  ?  ?  ?  ?  ?  ?  ?  ?  ?  ?  ? ?  ?Prior Functioning/Environment    ?  ?  ?  ?   ? ?Frequency ? Min 2X/week  ? ? ? ? ?  ?  Progress Toward Goals ? ?OT Goals(current goals can now be found in the care plan section) ? Progress towards OT goals: Progressing toward goals ? ?Acute Rehab OT Goals ?OT Goal Formulation: With patient ?Time For Goal Achievement: 04/10/21 ?Potential to Achieve Goals: Good  ?Plan Discharge plan remains appropriate   ? ?Co-evaluation ? ? ?   ?  ?  ?  ?  ? ?  ?AM-PAC OT "6 Clicks" Daily Activity     ?Outcome Measure ? ? Help from another person eating meals?: None ?Help from another person taking care of personal grooming?: A Little ?Help from another person toileting, which includes using toliet, bedpan, or urinal?: A Little ?Help from another person bathing (including washing, rinsing, drying)?: A Little ?Help from another person to put on and taking off regular upper body clothing?: None ?Help from another person to put on and taking off regular lower body clothing?: A Little ?6 Click Score: 20 ? ?  ?End of Session Equipment Utilized During Treatment: Rolling walker (2  wheels) ? ?OT Visit Diagnosis: Unsteadiness on feet (R26.81);Other abnormalities of gait and mobility (R26.89);Muscle weakness (generalized) (M62.81);Other (comment) ?  ?Activity Tolerance Treatment limited secondary to medical complications (Comment) (pt with nausea, dizziness) ?  ?Patient Left in bed;with call bell/phone within reach;with bed alarm set ?  ?Nurse Communication   ?  ? ?   ? ?Time: 1545-1600 ?OT Time Calculation (min): 15 min ? ?Charges: OT General Charges ?$OT Visit: 1 Visit ?OT Treatments ?$Self Care/Home Management : 8-22 mins ? ?Nestor Lewandowsky, OTR/L ?Acute Rehabilitation Services ?Pager: [phone] ?Office: [phone]  ? ?[name] ?03/29/2021, 4:23 PM ?

## 2021-03-29 NOTE — Progress Notes (Signed)
Initial Nutrition Assessment ? ?DOCUMENTATION CODES:  ?Not applicable ? ?INTERVENTION:  ?Adjust diet to 2g Na restriction to allow for more dining choices. Encourage PO intake ?MVI with minerals daily ?Ensure Enlive po BID, each supplement provides 350 kcal and 20 grams of protein. ? ? ?NUTRITION DIAGNOSIS:  ?Inadequate oral intake related to decreased appetite as evidenced by per patient/family report. ? ?GOAL:  ?Patient will meet greater than or equal to 90% of their needs ? ?MONITOR:  ?PO intake, Supplement acceptance, Weight trends ? ?REASON FOR ASSESSMENT:  ?LOS ?  ? ?ASSESSMENT:  ?86 y.o. female with hx of HTN, atrial fibrillation, GERD, hx of dysphagia, osteoporosis, cirrhosis, CKD3, CHF, and hx of right hemicolectomy presented to ED with chest pain and SOB. Found to have CHF exacerbation with fluid overload ? ?Pt resting in bedside chair at the time of assessment. Reports that initially, appetite was poor but that she has been eating much better over the last 3 days. Discussed appetite at home, pt reports over the last few months she has had less energy and has been cooking less and opting for easier to prepare foods like sandwiches. Energy has been declining over this time frame as well.  ? ?Discussed nutrition supplements. Pt reports that she prefers vanilla flavors. Familiar with ensure/boost products. Will add to augment intake and also adjust diet to liberalize fat restriction in the context of advanced age. Will continue to limit sodium since intake is improving and edema is still present to the BLE. ? ?Pt reports that swelling in her legs has improved and she is able to breathe much easier. Got up and walked earlier today. Inquired about weight, pt states she thinks she is back to her baseline now that edema has improved.  ? ?Noted that pt lives alone in a senior apartment complex. Ambulates with a cane at baseline when she is in public. States that she doesn't requires it when moving around her  apartment.  ? ?Average Meal Intake: ?3/1-3/10: 58% average intake x 6 recorded meals ? ?Net IO Since Admission: -1,894.92 mL [03/29/21 1235] ? ?Nutritionally Relevant Medications: ?Scheduled Meds: ? pantoprazole  40 mg Oral Daily  ? ?PRN Meds: senna ? ?Labs Reviewed: ?BUN 63, creatinine 2.94 ?Phosphorus 4.7  ?HgbA1c 6.2% (07/25/20) ? ?NUTRITION - FOCUSED PHYSICAL EXAM: ?Depletions present on exam, could be related to normal aging. Continue to monitor. ?Flowsheet Row Most Recent Value  ?Orbital Region No depletion  ?Upper Arm Region No depletion  ?Thoracic and Lumbar Region Mild depletion  ?Buccal Region No depletion  ?Temple Region No depletion  ?Clavicle Bone Region Mild depletion  ?Clavicle and Acromion Bone Region Mild depletion  ?Scapular Bone Region Mild depletion  ?Dorsal Hand Moderate depletion  ?Patellar Region No depletion  ?Anterior Thigh Region No depletion  ?Posterior Calf Region No depletion  [edema present]  ?Edema (RD Assessment) Moderate  [BLE]  ?Hair Reviewed  ?Eyes Reviewed  ?Mouth Reviewed  ?Skin Reviewed  ?Nails Reviewed  ? ? ?Diet Order:   ?Diet Order   ? ?       ?  Diet 2 gram sodium Room service appropriate? Yes; Fluid consistency: Thin  Diet effective now       ?  ? ?  ?  ? ?  ? ? ?EDUCATION NEEDS:  ?No education needs have been identified at this time ? ?Skin:  Skin Assessment: Reviewed RN Assessment ? ?Last BM:  3/7 ? ?Height:  ?Ht Readings from Last 1 Encounters:  ?03/20/21 5\' 8"  (1.727  m)  ? ? ?Weight:  ?Wt Readings from Last 1 Encounters:  ?03/29/21 74.1 kg  ? ? ?Ideal Body Weight:  63.6 kg ? ?BMI:  Body mass index is 24.83 kg/m?. ? ?Estimated Nutritional Needs:  ?Kcal:  1600-1800 kcal/d ?Protein:  80-90 g/d ?Fluid:  1.8-2L/d ? ? ?Ranell Patrick, RD, LDN ?Clinical Dietitian ?RD pager # available in [place]  ?After hours/weekend pager # available in [place] ?

## 2021-03-30 DIAGNOSIS — J9601 Acute respiratory failure with hypoxia: Secondary | ICD-10-CM | POA: Diagnosis not present

## 2021-03-30 DIAGNOSIS — N183 Chronic kidney disease, stage 3 unspecified: Secondary | ICD-10-CM | POA: Diagnosis not present

## 2021-03-30 DIAGNOSIS — I5021 Acute systolic (congestive) heart failure: Secondary | ICD-10-CM | POA: Diagnosis not present

## 2021-03-30 LAB — URINE CULTURE: Culture: >=100000 — AB

## 2021-03-30 LAB — BASIC METABOLIC PANEL
Anion gap: 10 (ref 5–15)
BUN: 68 mg/dL — ABNORMAL HIGH (ref 8–23)
CO2: 22 mmol/L (ref 22–32)
Calcium: 9.1 mg/dL (ref 8.9–10.3)
Chloride: 106 mmol/L (ref 98–111)
Creatinine, Ser: 2.76 mg/dL — ABNORMAL HIGH (ref 0.44–1.00)
GFR, Estimated: 17 mL/min — ABNORMAL LOW (ref >60)
Glucose, Bld: 100 mg/dL — ABNORMAL HIGH (ref 70–99)
Potassium: 3.7 mmol/L (ref 3.5–5.1)
Sodium: 138 mmol/L (ref 135–145)

## 2021-03-30 LAB — HEPARIN LEVEL (UNFRACTIONATED): Heparin Unfractionated: 0.41 IU/mL (ref 0.30–0.70)

## 2021-03-30 LAB — CBC
HCT: 37.7 % (ref 36.0–46.0)
Hemoglobin: 11.5 g/dL — ABNORMAL LOW (ref 12.0–15.0)
MCH: 27.5 pg (ref 26.0–34.0)
MCHC: 30.5 g/dL (ref 30.0–36.0)
MCV: 90.2 fL (ref 80.0–100.0)
Platelets: 210 10*3/uL (ref 150–400)
RBC: 4.18 MIL/uL (ref 3.87–5.11)
RDW: 15.5 % (ref 11.5–15.5)
WBC: 8.3 10*3/uL (ref 4.0–10.5)
nRBC: 0 % (ref 0.0–0.2)

## 2021-03-30 MED ORDER — CHLORHEXIDINE GLUCONATE CLOTH 2 % EX PADS
6.0000 | MEDICATED_PAD | Freq: Every day | CUTANEOUS | Status: DC
  Administered 2021-03-30 – 2021-04-03 (×5): 6 via TOPICAL

## 2021-03-30 MED ORDER — POLYETHYLENE GLYCOL 3350 17 G PO PACK
17.0000 g | PACK | Freq: Every day | ORAL | Status: DC
  Administered 2021-03-31 – 2021-04-02 (×3): 17 g via ORAL
  Filled 2021-03-30 (×4): qty 1

## 2021-03-30 NOTE — Progress Notes (Signed)
Patient attempted to void on BSC with <50 cc UO. Bladder scan showed 519 mls residual urine. Dr. Caron Presume notified and orders given to place foley catheter to drainage bag and patient  will follow up with urology after discharge.  ?Reviewed plan of care with patient and discussed foley placement prior to insertion. Tolerated well. 350 mls clear yellow urine output when foley inserted and continuous to drain.  ?

## 2021-03-30 NOTE — Progress Notes (Addendum)
The patient denies any new concerns. She is still having difficulty urinating. ? ?Exam: ?Gen: No distress ?Heart: S1 S2 normal, soft 2/6 systolic murmur, irregular rhythm ?Lung: CTA ?Abd: Mild suprapubic tenderness ?Ext: No edema ? ?A/P: ?Acute on Chronic combined CHF ?Appears euvolemic ?Stable on Metoprolol XL 50 daily ?Awaiting RHC/LHC by Cards ?Monitor I&Os ? ? ?AKI on stage IIIb CKD: ??? related to UTI vs. urine retention with backflow to the kidney ?Kidney function improving on oral hydration. ?Monitor closely ? ?UTI with Urinary retention: ?Urine culture and sensitivity reviewed. ?Continue Keflex as ordered. ?Bladder scan Q6 and straight cath if vol is >300 ?She might need foley placement if she continues to retain her urine. ?Consult urology for additional assistance. ?Continue Flomax. ?

## 2021-03-30 NOTE — Progress Notes (Signed)
Updated Dr. Caron Presume regarding patients output. Patient has urinated 3 times and had incontinence during transfer to commode. Unable to measure UO but noted around 200 mls in [facility]. Will have patient return to bed and obtain a bladder scan as soon as possible. Patient has been sitting up with visitors this afternoon and tolerating activity well. Will place purwick to maintain better urine output when in bed.  ?

## 2021-03-30 NOTE — Progress Notes (Signed)
Family Medicine Teaching Service ?Daily Progress Note ?Intern Pager: [phone] ? ?[name] Medical record number: [id number] ?Date of birth: November 25, 1935 Age: 86 y.o. Gender: female ? ?Primary Care Provider: Dickie La, MD ?Consultants: Cardiology, Palliative ?Code Status: Full ? ?Pt Overview and Major Events to Date:  ?02/01-Admitted to FPTS ? ?Assessment and Plan: ?Patient is an 86 year old female who presented with dyspnea on exertion and chest pain.  PMH significant for HFpEF (now HFrEF), s/p prosthetic mitral valve on Coumadin, permanent A-fib, HTN, CKD stage III, osteoporosis, history of right hemicolectomy, GERD ?  ? ?HFrEF ?VSS.  Breathing comfortably on room air.  24h UOP 815cc.  Euvolemic on exam. ?-Cardiology following, appreciate recommendations ?-Continue diuresis ?-Continue Metoprolol 50 mg daily ?-Daily weights ?-Strict I&O's ?-Monitor electrolytes and replete as needed ?-Palliative care following for GOC ? ?ARF on CKD stage III  UTI Urinary retention ?VSS. Afebrile overnight.  Cr improving slowly.  Urine positive for Proteus Mirabilis and E.Coli.  Spoke with pharmacy and Keflex will cover CAUTI. ?-Continue Keflex 500 mg BID (03/10) for 5 days ?-Follow up with Sensitivities ?-Consult Urology for continued urinary retention ?-Continue Flomax 0.4 mg, could consider increasing but given has had soft BP in past would caution. ?-Bladder scan q6h-If requiring another straight cath, then insert indwelling foley ?-Avoid nephrotoxic agents ?-Monitor Cr daily ? ?S/p mechanical mitral valve  persistent A-fib ?Controlled HR overnight. Asymptomatic ?-Cardiology following, appreciate recommendations ?-Continue Heparin drip, transition to home dose Warfarin when appropriate ?-Continue Amiodarone drip  ?-Continue Metoprolol 50 mg daily ?  ?Hypertension ?Asymptomatic. MAPS >65 ?-Continue Metoprolol 50 mg daily ? ?FEN/GI: Heart Healthy ?PPx: Heparin drip ?Dispo:Home pending clinical improvement .  Barriers include not medically stable for discharge.  ? ?Subjective:  ?No acute events overnight.  Breathing has been good.  Reports intermittent lower abdominal pressure. Has not been able to void only small dribbling. ? ?Objective: ?Temp:  [97.5 ?F (36.4 ?C)-97.9 ?F (36.6 ?C)] 97.9 ?F (36.6 ?C) (03/11 0556) ?Pulse Rate:  [42-96] 92 (03/11 0556) ?Resp:  [17-19] 19 (03/11 0556) ?BP: (99-141)/(62-95) 133/85 (03/11 0556) ?SpO2:  [99 %-100 %] 100 % (03/11 0556) ?Weight:  [74.1 kg] 74.1 kg (03/10 1057) ?Physical Exam: ?General: 86 y.o. female in NAD ?Cardio: RRR no m/r/g ?Lungs: CTAB, no wheezing, no rhonchi, no crackles, no IWOB on room air ?Abdomen: Soft, mild suprapubic tenderness to palpation, non-distended, positive bowel sounds ?Skin: warm and dry ?Extremities: No edema  ? ?Laboratory: ?Recent Labs  ?Lab 03/28/21 ?0252 03/29/21 ?0214 03/30/21 ?0214  ?WBC 11.5* 14.3* 8.3  ?HGB 11.1* 9.7* 11.5*  ?HCT 35.7* 32.3* 37.7  ?PLT 211 227 210  ? ?Recent Labs  ?Lab 03/25/21 ?0307 03/26/21 ?0309 03/28/21 ?[id number] 03/29/21 ?0214 03/30/21 ?0214  ?NA 140   < > 139 138 138  ?K 3.5   < > 4.0 3.7 3.7  ?CL 107   < > 107 108 106  ?CO2 24   < > 23 21* 22  ?BUN 56*   < > 46* 63* 68*  ?CREATININE 2.86*   < > 2.37* 2.94* 2.76*  ?CALCIUM 9.0   < > 8.9 8.5* 9.1  ?PROT 6.6  --   --   --   --   ?BILITOT 0.9  --   --   --   --   ?ALKPHOS 38  --   --   --   --   ?ALT 15  --   --   --   --   ?AST  18  --   --   --   --   ?GLUCOSE 97   < > 125* 109* 100*  ? < > = values in this interval not displayed.  ? ? ? ? ?Imaging/Diagnostic Tests: ?No results found.  ? ?Carollee Leitz, MD ?03/30/2021, 7:42 AM ?PGY-3, [place] ?[place] Intern pager: [phone], text pages welcome  ?

## 2021-03-30 NOTE — Progress Notes (Signed)
ANTICOAGULATION CONSULT NOTE - Follow Up Consult ? ?Pharmacy Consult for warfarin>heparin ?Indication:  mechanical mitral valve ? ?Allergies  ?Allergen Reactions  ? Iodinated Contrast Media Rash  ?  Pt stated in the past had broken out in a rash on back and itching.   ? Esomeprazole Magnesium   ?  REACTION: stomach upset, nausea  ? ? ?Patient Measurements: ?Height: 5\' 8"  (172.7 cm) ?Weight: 74.1 kg (163 lb 4.8 oz) ?IBW/kg (Calculated) : 63.9 ?Heparin Dosing Weight: 73.1 kg ? ?Vital Signs: ?Temp: 98.3 ?F (36.8 ?C) (03/11 0800) ?Temp Source: Oral (03/11 0800) ?BP: 133/85 (03/11 0556) ?Pulse Rate: 92 (03/11 0556) ? ?Labs: ?Recent Labs  ?  03/28/21 ?0252 03/29/21 ?0214 03/30/21 ?0214  ?HGB 11.1* 9.7* 11.5*  ?HCT 35.7* 32.3* 37.7  ?PLT 211 227 210  ?LABPROT 21.7*  --   --   ?INR 1.9*  --   --   ?HEPARINUNFRC 0.44 0.55 0.41  ?CREATININE 2.37* 2.94* 2.76*  ? ? ? ?Estimated Creatinine Clearance: 15.6 mL/min (A) (by C-G formula based on SCr of 2.76 mg/dL (H)). ? ? ?Assessment: ?86 yo F with a history of mechanical mitral valve replacement/PAF, on long-term anticoagulation with warfarin. PTA warfarin dosing: 5 mg Sun/Tues/Thurs, 10 mg all other days (last dose of warfarin 3/1). Pharmacy consulted for heparin dosing.  ?  ?Was scheduled for [facility] 3/9, however patient is experiencing continued rise in Scr and cath has been post-poned. Plan is for cath on Monday.  ? ?Heparin level therapeutic at 0.41 with heparin running at 1100 units/hour. Hgb improved to 11's, platelets are within normal limits. No signs of bleeding noted, no IV site issues per RN.  ? ?Of note, patient has been started on amiodarone for rhythm control while inpatient. She should have close outpatient follow-up with [facility] to ensure appropriate warfarin dosing as amiodarone can increase INR and patient has been stable on her home regimen for many years.  ? ?Goal of Therapy:  ?Heparin level 0.3-0.7 units/ml ?Monitor platelets by anticoagulation  protocol: Yes ?  ?Plan:  ?Continue heparin infusion at 1,100 units/hr ?Daily heparin level, CBC  ?Continue to monitor H&H and platelets ?F/U cardiology recs and restarting home warfarin ? ?Thank you for involving pharmacy in this patient's care. ? ?Elita Quick, PharmD ?PGY1 Ambulatory Care Pharmacy Resident ?03/30/2021 11:12 AM ? ?**Pharmacist phone directory can be found on [email or web address] listed under [place]** ?

## 2021-03-30 NOTE — Progress Notes (Signed)
? ?Progress Note ? ?[name] ?Date of Encounter: 03/30/2021 ? ?[facility] Cardiologist: Cristopher Peru, MD  ? ?Subjective  ? ?Upset about her urinary retention. States she is still not able to void. No chest pain or SOB. Breathing is improved.  ? ?HR better controlled 70-100s ?BP improved 130-140s ?Cr downtredning 2.94>2.76 ?BNP 513 which is improved since admission ?Started on keflex for UTI ? ?Inpatient Medications  ?  ?Scheduled Meds: ? cephALEXin  500 mg Oral Q12H  ? dorzolamide-timolol  1 drop Both Eyes BID  ? feeding supplement  237 mL Oral BID BM  ? latanoprost  1 drop Both Eyes QHS  ? metoprolol succinate  50 mg Oral Daily  ? multivitamin with minerals  1 tablet Oral Daily  ? pantoprazole  40 mg Oral Daily  ? sodium chloride flush  3 mL Intravenous Q12H  ? tamsulosin  0.4 mg Oral Daily  ? ?Continuous Infusions: ? sodium chloride    ? amiodarone 30 mg/hr (03/29/21 2105)  ? heparin 1,100 Units/hr (03/29/21 1913)  ? ?PRN Meds: ?sodium chloride, acetaminophen, albuterol, diclofenac Sodium, senna, sodium chloride flush  ? ?Vital Signs  ?  ?Vitals:  ? 03/29/21 2037 03/30/21 0100 03/30/21 0556 03/30/21 0800  ?BP: 99/62 (!) 141/86 133/85   ?Pulse: 89  92   ?Resp: 18  19 20   ?Temp: (!) 97.5 ?F (36.4 ?C)  97.9 ?F (36.6 ?C) 98.3 ?F (36.8 ?C)  ?TempSrc: Oral  Oral Oral  ?SpO2: 99%  100%   ?Weight:      ?Height:      ? ? ?Intake/Output Summary (Last 24 hours) at 03/30/2021 0925 ?Last data filed at 03/30/2021 0155 ?Gross per 24 hour  ?Intake 551.55 ml  ?Output 665 ml  ?Net -113.45 ml  ? ?Last 3 Weights 03/29/2021 03/28/2021 03/27/2021  ?Weight (lbs) 163 lb 4.8 oz 160 lb 4.4 oz 162 lb 0.6 oz  ?Weight (kg) 74.072 kg 72.7 kg 73.5 kg  ?   ? ?Telemetry  ?  ?Afib with rates better controlled 80-110s- Personally Reviewed ? ?ECG  ?  ?No new tracing today-Personally Reviewed ? ?Physical Exam  ? ?GEN: Sitting up in a chair, comfortable ?Neck: No JVD ?Cardiac: Irregularly irregular, +mechanical click ?Respiratory:  Clear to auscultation bilaterally. ?GI: Soft, nontender, non-distended  ?MS: No edema; No deformity.  ?Neuro:  Nonfocal  ?Psych: Normal affect  ? ?Labs  ?  ?High Sensitivity Troponin:   ?Recent Labs  ?Lab 03/20/21 ?1340 03/20/21 ?1647  ?TROPONINIHS 14 14  ?   ?Chemistry ?Recent Labs  ?Lab 03/24/21 ?0109 03/25/21 ?[id number] 03/26/21 ?0309 03/28/21 ?[id number] 03/29/21 ?0214 03/30/21 ?0214  ?NA 138 140   < > 139 138 138  ?K 3.5 3.5   < > 4.0 3.7 3.7  ?CL 103 107   < > 107 108 106  ?CO2 24 24   < > 23 21* 22  ?GLUCOSE 110* 97   < > 125* 109* 100*  ?BUN 63* 56*   < > 46* 63* 68*  ?CREATININE 4.34* 2.86*   < > 2.37* 2.94* 2.76*  ?CALCIUM 9.1 9.0   < > 8.9 8.5* 9.1  ?PROT  --  6.6  --   --   --   --   ?ALBUMIN 3.2* 2.9*  --   --   --   --   ?AST  --  18  --   --   --   --   ?ALT  --  15  --   --   --   --   ?  ALKPHOS  --  38  --   --   --   --   ?BILITOT  --  0.9  --   --   --   --   ?GFRNONAA 10* 16*   < > 20* 15* 17*  ?ANIONGAP 11 9   < > 9 9 10   ? < > = values in this interval not displayed.  ?  ?Lipids No results for input(s): CHOL, TRIG, HDL, LABVLDL, LDLCALC, CHOLHDL in the last 168 hours.  ?Hematology ?Recent Labs  ?Lab 03/28/21 ?0252 03/29/21 ?0214 03/30/21 ?0214  ?WBC 11.5* 14.3* 8.3  ?RBC 4.04 3.62* 4.18  ?HGB 11.1* 9.7* 11.5*  ?HCT 35.7* 32.3* 37.7  ?MCV 88.4 89.2 90.2  ?MCH 27.5 26.8 27.5  ?MCHC 31.1 30.0 30.5  ?RDW 15.5 15.4 15.5  ?PLT 211 227 210  ? ?Thyroid No results for input(s): TSH, FREET4 in the last 168 hours.  ?BNP ?Recent Labs  ?Lab 03/29/21 ?0214  ?BNP 513.6*  ?  ?DDimer No results for input(s): DDIMER in the last 168 hours.  ? ?Radiology  ?  ?No results found. ? ?Cardiac Studies  ? ?TTE 03/21/21: ?IMPRESSIONS  ? 1. Left ventricular ejection fraction, by estimation, is 35 to 40%. The  ?left ventricle has moderately decreased function. The left ventricle  ?demonstrates global hypokinesis. There is moderate left ventricular  ?hypertrophy. Left ventricular diastolic  ?function could not be evaluated.  ? 2. Right  ventricular systolic function is mildly reduced. The right  ?ventricular size is normal.  ? 3. Left atrial size was Massively dilated at 126 ml/m2.  ? 4. Right atrial size was severely dilated.  ? 5. The mitral valve has been repaired/replaced. Trivial mitral valve  ?regurgitation. No evidence of mitral stenosis. There is a St. Jude  ?mechanical valve present in the mitral position. Procedure Date: 27.  ?Echo findings are consistent with normal  ?structure and function of the mitral valve prosthesis.  ? 6. The aortic valve is calcified. Aortic valve regurgitation is not  ?visualized. Mild to moderate aortic valve stenosis. Aortic valve area, by  ?VTI measures 1.48 cm?Marland Kitchen Aortic valve mean gradient measures 8.0 mmHg.  ?Aortic valve Vmax measures 1.94 m/s.  ? ?Comparison(s): Changes from prior study are noted. 07/25/2020: LVEF 50-55%. ? ?Patient Profile  ?   ?86 y.o. female with PMH of chronic diastolic CHF, chronic atrial fibrillation on coumadin, St Jude mechanical MVR 1987, mild AS, NSVT, RBBB, HTN, CKD stage III, GERD and chronic anemia who presented on 3/1 with worsening dyspnea for 3 months. BNP elevated at 1633 with course complicated by ARF out-of-proportion to diuresis with possible element of obstructive etiology given improvement with foley placement. Also with Afib with RVR. Cardiology is consulted for HFrEF and Afib.  ? ?Assessment & Plan  ?  ?#Acute on chronic combined heart failure: ?Newly diagnosed on this admission. TTE 03/21/21 with LVEF 35-40%, global hypokinesis, moderate LVH, mild RV systolic dysfunction, severe biatrial enlargement, mechanical MVR with normal function, mild-to-moderate AS. Lasix held due to worsening renal function with concern for possible obstruction given brisk UOP after foley placement. No hydronephrosis on ultrasound. Renal function initially improved but then bumped slightly (likely due to Afib with soft pressures and UTI) now downtrending again ?- Hopefully plan for RHC/LHC  on Monday pending renal function ?- Imdur and hydralazine initially held due to soft pressures; can add as needed ?- Continue metop 50mg  XL daily ?- Cannot add ACE/ARNI/ARB or spiro due to renal dysfunction; will add  as able ?- Monitor I/Os and daily weights ?- Low Na diet ? ?#ARF CKD IIIa ?Developed significant AKI that was more rapid than would have been expected than with diuresis alone earlier during hospitalization. Suspect element of bladder outlet obstruction given brisk diuresis after foley placement. Renal ultrasound with no hydronephrosis. Initially improved but then rose again. Possibly acute rise due to Afib with RVR and relatively soft blood pressures as well as possible UTI. Does not appear overtly overloaded and BNP down from prior.  ?-Will manage Afib as below ?-Holding hydralazine and imdur due to episode of soft pressures; will add as needed ?-Management of UTI per primary ? ?#Saint Jude mechanical mitral valve 1987: ?TTE with normal functioning mechanical valve. ?- Routine follow-up as outpatient ?- Warfarin held given plans for possible LHC/RHC ?- Continue heparin gtt for now ? ?#Persistent atrial fibrillation: ?HR better controlled.  ?-Will continue amiodarone gtt for today; possibly PO tomorrow  ?-Once transition to PO, will plan for amiodarone 400mg  BID x7 days, 200mg  BID x7 days, then 200mg  daily thereafter ?-Continue metop 50mg  XL daily ?-Warfarin held given plans for possible LHC/RHC once Cr improved ?-Continue heparin gtt for now ? ?#Mild-to-moderate AS: ?-Will need routine follow-up as out-patient ? ?#HTN: ?Low in the setting of Afib with RVR; now improving.  ?-Holding imdur and hydralazine; will add back as needed ?- Continue metop 50mg  XL daily ? ?#UTI: ?#Urinary Retention: ?-Management per primary ? ?For questions or updates, please contact [place] ?Please consult [email or web address] for contact info under  ? ?  ?   ?Signed, ?Freada Bergeron, MD  ?03/30/2021, 9:25 AM   ? ?

## 2021-03-30 NOTE — Progress Notes (Signed)
Received call from [facility] resident regarding urinary retention. Pt has failed multiple void trials in the hospital. Now diagnosed with UTI. I recommend continue abx and followup outpatient with [facility] for repeat void trial and urodynamics. ? ?Matt R. Coralie Stanke MD ?[facility]  ?Pager: [phone] ? ?

## 2021-03-31 DIAGNOSIS — I482 Chronic atrial fibrillation, unspecified: Secondary | ICD-10-CM | POA: Diagnosis not present

## 2021-03-31 DIAGNOSIS — I5021 Acute systolic (congestive) heart failure: Secondary | ICD-10-CM | POA: Diagnosis not present

## 2021-03-31 LAB — CBC
HCT: 32.5 % — ABNORMAL LOW (ref 36.0–46.0)
Hemoglobin: 10 g/dL — ABNORMAL LOW (ref 12.0–15.0)
MCH: 27.2 pg (ref 26.0–34.0)
MCHC: 30.8 g/dL (ref 30.0–36.0)
MCV: 88.6 fL (ref 80.0–100.0)
Platelets: 225 10*3/uL (ref 150–400)
RBC: 3.67 MIL/uL — ABNORMAL LOW (ref 3.87–5.11)
RDW: 15.5 % (ref 11.5–15.5)
WBC: 6 10*3/uL (ref 4.0–10.5)
nRBC: 0 % (ref 0.0–0.2)

## 2021-03-31 LAB — MAGNESIUM: Magnesium: 1.9 mg/dL (ref 1.7–2.4)

## 2021-03-31 LAB — BASIC METABOLIC PANEL
Anion gap: 8 (ref 5–15)
BUN: 62 mg/dL — ABNORMAL HIGH (ref 8–23)
CO2: 23 mmol/L (ref 22–32)
Calcium: 8.9 mg/dL (ref 8.9–10.3)
Chloride: 107 mmol/L (ref 98–111)
Creatinine, Ser: 2.21 mg/dL — ABNORMAL HIGH (ref 0.44–1.00)
GFR, Estimated: 22 mL/min — ABNORMAL LOW (ref >60)
Glucose, Bld: 108 mg/dL — ABNORMAL HIGH (ref 70–99)
Potassium: 3.7 mmol/L (ref 3.5–5.1)
Sodium: 138 mmol/L (ref 135–145)

## 2021-03-31 LAB — HEPARIN LEVEL (UNFRACTIONATED): Heparin Unfractionated: 0.51 IU/mL (ref 0.30–0.70)

## 2021-03-31 NOTE — Progress Notes (Signed)
ANTICOAGULATION CONSULT NOTE - Follow Up Consult ? ?Pharmacy Consult for warfarin>heparin ?Indication:  mechanical mitral valve ? ?Allergies  ?Allergen Reactions  ? Iodinated Contrast Media Rash  ?  Pt stated in the past had broken out in a rash on back and itching.   ? Esomeprazole Magnesium   ?  REACTION: stomach upset, nausea  ? ? ?Patient Measurements: ?Height: 5\' 8"  (172.7 cm) ?Weight: 79 kg (174 lb 2.6 oz) ?IBW/kg (Calculated) : 63.9 ?Heparin Dosing Weight: 73.1 kg ? ?Vital Signs: ?Temp: 97.9 ?F (36.6 ?C) (03/12 0710) ?Temp Source: Oral (03/12 0710) ?BP: 124/70 (03/12 0525) ?Pulse Rate: 93 (03/12 0525) ? ?Labs: ?Recent Labs  ?  03/29/21 ?0214 03/30/21 ?0214 03/31/21 ?0123  ?HGB 9.7* 11.5* 10.0*  ?HCT 32.3* 37.7 32.5*  ?PLT 227 210 225  ?HEPARINUNFRC 0.55 0.41 0.51  ?CREATININE 2.94* 2.76* 2.21*  ? ? ? ?Estimated Creatinine Clearance: 21.3 mL/min (A) (by C-G formula based on SCr of 2.21 mg/dL (H)). ? ? ?Assessment: ?86 yo F with a history of mechanical mitral valve replacement/PAF, on long-term anticoagulation with warfarin. PTA warfarin dosing: 5 mg Sun/Tues/Thurs, 10 mg all other days (last dose of warfarin 3/1). Pharmacy consulted for heparin dosing.  ?  ?Was scheduled for [facility] 3/9, however patient is experiencing continued rise in Scr and cath has been post-poned. Plan is for cath on Monday.  ? ?Heparin level therapeutic at 0.51 with heparin running at 1100 units/hour. Hgb stable this AM, platelets are within normal limits. No signs of bleeding or IV site issues noted.  ? ?Of note, patient has been started on amiodarone for rhythm control while inpatient. She should have close outpatient follow-up with [facility] to ensure appropriate warfarin dosing as amiodarone can increase INR and patient has been stable on her home regimen for many years.  ? ?Goal of Therapy:  ?Heparin level 0.3-0.7 units/ml ?Monitor platelets by anticoagulation protocol: Yes ?  ?Plan:  ?Continue heparin infusion at 1,100  units/hr ?Daily heparin level, CBC  ?Continue to monitor H&H and platelets ?F/U cardiology recs and restarting home warfarin ? ?Thank you for involving pharmacy in this patient's care. ? ?Elita Quick, PharmD ?PGY1 Ambulatory Care Pharmacy Resident ?03/31/2021 9:59 AM ? ?**Pharmacist phone directory can be found on [email or web address] listed under [place]** ?

## 2021-03-31 NOTE — Progress Notes (Signed)
FPTS Interim Night Progress Note ? ?S:Patient sleeping comfortably.  Rounded with primary night RN.  No concerns voiced.  No orders required.   ? ?O: ?Today's Vitals  ? 03/31/21 0710 03/31/21 1100 03/31/21 1539 03/31/21 2028  ?BP:  134/76 111/84 127/84  ?Pulse:  93 97 96  ?Resp:  16 18 18   ?Temp: 97.9 ?F (36.6 ?C) 97.8 ?F (36.6 ?C) 98.5 ?F (36.9 ?C) 98.8 ?F (37.1 ?C)  ?TempSrc: Oral Oral Axillary Axillary  ?SpO2:  100% 100% 98%  ?Weight:      ?Height:      ?PainSc:  0-No pain  0-No pain  ? ? ? ? ?A/P: ?Continue current management ? ? ? MD ?PGY-3, [facility] ?Service pager [phone]   ?

## 2021-03-31 NOTE — Progress Notes (Signed)
Mobility Specialist Progress Note ? ? 03/31/21 1819  ?Mobility  ?Activity Ambulated with assistance in hallway  ?Level of Assistance Minimal assist, patient does 75% or more  ?Assistive Device Front wheel walker  ?Distance Ambulated (ft) 270 ft  ?Activity Response Tolerated well  ?$Mobility charge 1 Mobility  ? ?Pt inclined to mobility today and having no complaints. MinG - A to get pt EOB d/t LE weakness and minA during STS (x2 bouts). x1 standing rest break during ambulation d/t SOB but quickly recovered w/ pursed lip breathing. Upon returning back to EOB pt c/o feeling really hot and HR was sitting at 155bpm for ~93min. Once HR declined returned pt back supine and notified RN of spike.     ? ?Pre Mobility: 118 HR, ?During Mobility: 155 HR ?Post Mobility: 110 HR, 155/86 BP ? ?Holland Falling ?Mobility Specialist ?Phone Number [phone] ? ?

## 2021-03-31 NOTE — Progress Notes (Signed)
Pharmacist Heart Failure Core Measure Documentation ? ?Assessment: ?[name] has an EF documented as 35-40% on 03/21/21 by ECHO. ? ?Rationale: Heart failure patients with left ventricular systolic dysfunction (LVSD) and an EF < 40% should be prescribed an angiotensin converting enzyme inhibitor (ACEI) or angiotensin receptor blocker (ARB) at discharge unless a contraindication is documented in the medical record. ? ?This patient is not currently on an ACEI or ARB for HF. ? ?This note is being placed in the record in order to provide documentation that a contraindication to the use of these agents is present for this encounter. ? ?ACE Inhibitor or Angiotensin Receptor Blocker is contraindicated ?(specify all that apply) ? ?[]   ACEI allergy AND ARB allergy ?[]   Angioedema ?[]   Moderate or severe aortic stenosis ?[]   Hyperkalemia ?[]   Hypotension ?[]   Renal artery stenosis ?[x]   Worsening renal function, preexisting renal disease or dysfunction ? ? ? 03/31/2021 2:09 PM  ?

## 2021-03-31 NOTE — Progress Notes (Signed)
Family Medicine Teaching Service ?Daily Progress Note ?Intern Pager: [phone] ? ?[name] Medical record number: [id number] ?Date of birth: May 24, 1935 Age: 86 y.o. Gender: female ? ?Primary Care Provider: Dickie La, MD ?Consultants: Cardiology, Palliative, Urology (curbsided)  ?Code Status: Full  ? ?Pt Overview and Major Events to Date:  ?02/01-Admitted ? ?Assessment and Plan: ?Patient is an 86 year old female who presented with dyspnea on exertion and chest pain.  PMH significant for HFpEF (now HFrEF), s/p prosthetic mitral valve on Coumadin, permanent A-fib, HTN, CKD stage III, osteoporosis, history of right hemicolectomy, GERD ? ?AKI on CKD stage III  Urinary retention ?Cr 2.21 down from 2.76 yesterday. UOP 850 mLs yesterday.  Patient has failed voiding trial several occasions, Foley in place which she will be discharged with. ?Given urine was positive for Proteus mirabilis and E. coli, was started on Keflex.  Urine culture results show sensitivity to current antibiotic, only resistant to nitrofurantoin ?- Urology was consulted for urinary retention, recommends outpatient follow-up at [facility] urology for repeat void trial and urodynamics ?- Continue Keflex (day 3/5) ?- Continue Flomax 0.4 mg daily ?- Continue to monitor creatinine ?- Avoid nephrotoxic agents ? ?HFrEF (EF 35-40%) ?Vital stable, and on room air. UOP 850 mLs yesterday ?- Cardiology following, appreciate continued care and recommendations ?- Possible heart cath Monday 3/13 ?- Continue diuresis ?- Continue metoprolol 50 mg daily ?- Strict I's and O's, daily weights ? ?Persistent A-fib  clinical mitral valve ?HR stable at 93 bpm this morning ?- Cardiology following, appreciate continued care and recommendations ?- Continue heparin drip, possible heart cath tomorrow ?- Continue amiodarone drip, per cards possibly switch to po today will plan of 400mg  BID x7 days, 200mg  BID x7 days, then 200mg  daily ?- Continue metoprolol 50 mg  daily ? ?Hypertension ?BP stable, this morning at 124/70. ?- Continue metoprolol 50 mg daily ? ? ?FEN/GI: Heart healthy ?PPx: Heparin drip ? ?Disposition: Home pending clinical improvement ? ?Subjective:  ?No acute events overnight.  Patient sitting up in bed about to eat breakfast when I examined her.  States she is feeling well.  She did have questions about going home with her Foley which I told her that she would be with another trial off of the Foley when she sees urology outpatient.  Also discussed about possible heart cath. ? ?Objective: ?Temp:  [97.9 ?F (36.6 ?C)-99 ?F (37.2 ?C)] 97.9 ?F (36.6 ?C) (03/12 0710) ?Pulse Rate:  [88-93] 93 (03/12 0525) ?Resp:  [16] 16 (03/12 0525) ?BP: (108-124)/(61-70) 124/70 (03/12 0525) ?Weight:  [79 kg] 79 kg (03/12 0525) ?Physical Exam: ?General: alert, pleasant, NAD ?Cardiovascular: irregularly irregular ?Respiratory: CTAB normal WOB ?Abdomen: soft, non distended  ?Extremities: Moves spontaneously. Normal ROM  ? ?Laboratory: ?Recent Labs  ?Lab 03/29/21 ?0214 03/30/21 ?0214 03/31/21 ?0123  ?WBC 14.3* 8.3 6.0  ?HGB 9.7* 11.5* 10.0*  ?HCT 32.3* 37.7 32.5*  ?PLT 227 210 225  ? ?Recent Labs  ?Lab 03/25/21 ?0307 03/26/21 ?0309 03/29/21 ?0214 03/30/21 ?0214 03/31/21 ?0123  ?NA 140   < > 138 138 138  ?K 3.5   < > 3.7 3.7 3.7  ?CL 107   < > 108 106 107  ?CO2 24   < > 21* 22 23  ?BUN 56*   < > 63* 68* 62*  ?CREATININE 2.86*   < > 2.94* 2.76* 2.21*  ?CALCIUM 9.0   < > 8.5* 9.1 8.9  ?PROT 6.6  --   --   --   --   ?  BILITOT 0.9  --   --   --   --   ?ALKPHOS 38  --   --   --   --   ?ALT 15  --   --   --   --   ?AST 18  --   --   --   --   ?GLUCOSE 97   < > 109* 100* 108*  ? < > = values in this interval not displayed.  ? ? ? ?Imaging/Diagnostic Tests: ?None new ? ?Shary Key, DO ?03/31/2021, 9:02 AM ?PGY-2, [place] ?[place] Intern pager: [phone], text pages welcome  ?

## 2021-03-31 NOTE — Progress Notes (Signed)
? ?Progress Note ? ?[name] ?Date of Encounter: 03/31/2021 ? ?[facility] Cardiologist: Cristopher Peru, MD  ? ?Subjective  ? ?Feeling better now that foley has been placed. No chest pain or SOB.  ? ?Cr improving from 2.76>2.21 ?Negative 610 ? ?Inpatient Medications  ?  ?Scheduled Meds: ? cephALEXin  500 mg Oral Q12H  ? Chlorhexidine Gluconate Cloth  6 each Topical Daily  ? dorzolamide-timolol  1 drop Both Eyes BID  ? feeding supplement  237 mL Oral BID BM  ? latanoprost  1 drop Both Eyes QHS  ? metoprolol succinate  50 mg Oral Daily  ? multivitamin with minerals  1 tablet Oral Daily  ? pantoprazole  40 mg Oral Daily  ? polyethylene glycol  17 g Oral Daily  ? sodium chloride flush  3 mL Intravenous Q12H  ? tamsulosin  0.4 mg Oral Daily  ? ?Continuous Infusions: ? sodium chloride    ? amiodarone 30 mg/hr (03/30/21 2301)  ? heparin 1,100 Units/hr (03/30/21 1921)  ? ?PRN Meds: ?sodium chloride, acetaminophen, albuterol, diclofenac Sodium, senna, sodium chloride flush  ? ?Vital Signs  ?  ?Vitals:  ? 03/30/21 0556 03/30/21 0800 03/30/21 2010 03/31/21 0525  ?BP: 133/85  108/61 124/70  ?Pulse: 92  88 93  ?Resp: 19 20 16 16   ?Temp: 97.9 ?F (36.6 ?C) 98.3 ?F (36.8 ?C) 98.7 ?F (37.1 ?C) 99 ?F (37.2 ?C)  ?TempSrc: Oral Oral Axillary Axillary  ?SpO2: 100%     ?Weight:    79 kg  ?Height:      ? ? ?Intake/Output Summary (Last 24 hours) at 03/31/2021 [id number] ?Last data filed at 03/31/2021 [id number] ?Gross per 24 hour  ?Intake 240 ml  ?Output 850 ml  ?Net -610 ml  ? ? ?Last 3 Weights 03/31/2021 03/29/2021 03/28/2021  ?Weight (lbs) 174 lb 2.6 oz 163 lb 4.8 oz 160 lb 4.4 oz  ?Weight (kg) 79 kg 74.072 kg 72.7 kg  ?   ? ?Telemetry  ?  ?Afib with rates mainly 80-110s- Personally Reviewed ? ?ECG  ?  ?No new tracing today-Personally Reviewed ? ?Physical Exam  ? ?GEN: Laying in bed, comfortable ?Neck: No JVD ?Cardiac: Irregularly irregular, +mechanical click ?Respiratory: Clear to auscultation bilaterally. ?GI: Soft, nontender,  non-distended  ?MS: No edema; No deformity.  ?Neuro:  Nonfocal  ?Psych: Normal affect  ? ?Labs  ?  ?High Sensitivity Troponin:   ?Recent Labs  ?Lab 03/20/21 ?1340 03/20/21 ?1647  ?TROPONINIHS 14 14  ? ?   ?Chemistry ?Recent Labs  ?Lab 03/25/21 ?0307 03/26/21 ?0309 03/29/21 ?0214 03/30/21 ?0214 03/31/21 ?0123  ?NA 140   < > 138 138 138  ?K 3.5   < > 3.7 3.7 3.7  ?CL 107   < > 108 106 107  ?CO2 24   < > 21* 22 23  ?GLUCOSE 97   < > 109* 100* 108*  ?BUN 56*   < > 63* 68* 62*  ?CREATININE 2.86*   < > 2.94* 2.76* 2.21*  ?CALCIUM 9.0   < > 8.5* 9.1 8.9  ?MG  --   --   --   --  1.9  ?PROT 6.6  --   --   --   --   ?ALBUMIN 2.9*  --   --   --   --   ?AST 18  --   --   --   --   ?ALT 15  --   --   --   --   ?  ALKPHOS 38  --   --   --   --   ?BILITOT 0.9  --   --   --   --   ?GFRNONAA 16*   < > 15* 17* 22*  ?ANIONGAP 9   < > 9 10 8   ? < > = values in this interval not displayed.  ? ?  ?Lipids No results for input(s): CHOL, TRIG, HDL, LABVLDL, LDLCALC, CHOLHDL in the last 168 hours.  ?Hematology ?Recent Labs  ?Lab 03/29/21 ?0214 03/30/21 ?0214 03/31/21 ?0123  ?WBC 14.3* 8.3 6.0  ?RBC 3.62* 4.18 3.67*  ?HGB 9.7* 11.5* 10.0*  ?HCT 32.3* 37.7 32.5*  ?MCV 89.2 90.2 88.6  ?MCH 26.8 27.5 27.2  ?MCHC 30.0 30.5 30.8  ?RDW 15.4 15.5 15.5  ?PLT 227 210 225  ? ? ?Thyroid No results for input(s): TSH, FREET4 in the last 168 hours.  ?BNP ?Recent Labs  ?Lab 03/29/21 ?0214  ?BNP 513.6*  ? ?  ?DDimer No results for input(s): DDIMER in the last 168 hours.  ? ?Radiology  ?  ?No results found. ? ?Cardiac Studies  ? ?TTE 03/21/21: ?IMPRESSIONS  ? 1. Left ventricular ejection fraction, by estimation, is 35 to 40%. The  ?left ventricle has moderately decreased function. The left ventricle  ?demonstrates global hypokinesis. There is moderate left ventricular  ?hypertrophy. Left ventricular diastolic  ?function could not be evaluated.  ? 2. Right ventricular systolic function is mildly reduced. The right  ?ventricular size is normal.  ? 3. Left atrial  size was Massively dilated at 126 ml/m2.  ? 4. Right atrial size was severely dilated.  ? 5. The mitral valve has been repaired/replaced. Trivial mitral valve  ?regurgitation. No evidence of mitral stenosis. There is a St. Jude  ?mechanical valve present in the mitral position. Procedure Date: 22.  ?Echo findings are consistent with normal  ?structure and function of the mitral valve prosthesis.  ? 6. The aortic valve is calcified. Aortic valve regurgitation is not  ?visualized. Mild to moderate aortic valve stenosis. Aortic valve area, by  ?VTI measures 1.48 cm?Marland Kitchen Aortic valve mean gradient measures 8.0 mmHg.  ?Aortic valve Vmax measures 1.94 m/s.  ? ?Comparison(s): Changes from prior study are noted. 07/25/2020: LVEF 50-55%. ? ?Patient Profile  ?   ?86 y.o. female with PMH of chronic diastolic CHF, chronic atrial fibrillation on coumadin, St Jude mechanical MVR 1987, mild AS, NSVT, RBBB, HTN, CKD stage III, GERD and chronic anemia who presented on 3/1 with worsening dyspnea for 3 months. BNP elevated at 1633 with course complicated by ARF out-of-proportion to diuresis with possible element of obstructive etiology given improvement with foley placement. Also with Afib with RVR. Cardiology is consulted for HFrEF and Afib.  ? ?Assessment & Plan  ?  ?#Acute on chronic combined heart failure: ?Newly diagnosed on this admission. TTE 03/21/21 with LVEF 35-40%, global hypokinesis, moderate LVH, mild RV systolic dysfunction, severe biatrial enlargement, mechanical MVR with normal function, mild-to-moderate AS. Lasix held due to worsening renal function with concern for possible obstruction given brisk UOP after foley placement. No hydronephrosis on ultrasound. Renal function initially improved but then bumped slightly (likely due to Afib with soft pressures and UTI) now downtrending again ?- Hopefully plan for RHC/LHC early this week pending renal function; will reassess tomorrow morning (discussed procedure at length with  the patient) ?- Imdur and hydralazine held due to soft pressures; can add as needed ?- Continue metop 50mg  XL daily ?- Cannot add ACE/ARNI/ARB or spiro  due to renal dysfunction; will add as able ?- Monitor I/Os and daily weights ?- Low Na diet ? ?#ARF CKD IIIa ?Developed significant AKI that was more rapid than would have been expected than with diuresis alone earlier during hospitalization. Suspect element of bladder outlet obstruction given brisk diuresis after foley placement. Renal ultrasound with no hydronephrosis. Initially improved but then rose again. Possibly acute rise due to Afib with RVR and relatively soft blood pressures as well as possible UTI. Does not appear overtly overloaded and BNP down from prior. Cr now downtrending again. ?-Will manage Afib as below ?-Holding hydralazine and imdur due to episode of soft pressures; will add as needed ?-Management of UTI per primary ? ?#Saint Jude mechanical mitral valve 1987: ?TTE with normal functioning mechanical valve. ?- Routine follow-up as outpatient ?- Warfarin held given plans for possible LHC/RHC ?- Continue heparin gtt for now ? ?#Persistent atrial fibrillation: ?HR better controlled.  ?-Will continue amiodarone gtt for today given elevated rates; possibly PO tomorrow ?-Once transition to PO, will plan for amiodarone 400mg  BID x7 days, 200mg  BID x7 days, then 200mg  daily thereafter ?-Continue metop 50mg  XL daily ?-Warfarin held given plans for possible LHC/RHC once Cr improved ?-Continue heparin gtt for now ? ?#Mild-to-moderate AS: ?-Will need routine follow-up as out-patient ? ?#HTN: ?Low in the setting of Afib with RVR; now improving.  ?-Holding imdur and hydralazine; will add back as needed ?- Continue metop 50mg  XL daily ? ?#UTI: ?#Urinary Retention: ?-Management per primary ? ?For questions or updates, please contact [place] ?Please consult [email or web address] for contact info under  ? ?  ?   ?Signed, ?Freada Bergeron, MD  ?03/31/2021, 6:25  AM   ? ?

## 2021-03-31 NOTE — Progress Notes (Signed)
FPTS Interim Progress Note ? ?S:Patient sleeping and resting comfortably.   ? ?O: ?BP 108/61 (BP Location: Left Arm)   Pulse 88   Temp 98.7 ?F (37.1 ?C) (Axillary)   Resp 16   Ht 5\' 8"  (1.727 m)   Wt 74.1 kg   SpO2 100%   BMI 24.83 kg/m?   ? ?GEN: sleeping  ?RESP: equal chest rise and fall ? ? ? ?A/P: ?No changes to current plan. See daily progress note.  ? ? ? , DO ?03/31/2021, 1:42 AM ?PGY-3, [facility] Night Resident  ?Please page [phone] with questions.  ? ?

## 2021-04-01 LAB — BASIC METABOLIC PANEL
Anion gap: 7 (ref 5–15)
BUN: 46 mg/dL — ABNORMAL HIGH (ref 8–23)
CO2: 24 mmol/L (ref 22–32)
Calcium: 9.1 mg/dL (ref 8.9–10.3)
Chloride: 109 mmol/L (ref 98–111)
Creatinine, Ser: 1.89 mg/dL — ABNORMAL HIGH (ref 0.44–1.00)
GFR, Estimated: 26 mL/min — ABNORMAL LOW (ref >60)
Glucose, Bld: 110 mg/dL — ABNORMAL HIGH (ref 70–99)
Potassium: 3.7 mmol/L (ref 3.5–5.1)
Sodium: 140 mmol/L (ref 135–145)

## 2021-04-01 LAB — CBC
HCT: 33 % — ABNORMAL LOW (ref 36.0–46.0)
Hemoglobin: 10.2 g/dL — ABNORMAL LOW (ref 12.0–15.0)
MCH: 27.7 pg (ref 26.0–34.0)
MCHC: 30.9 g/dL (ref 30.0–36.0)
MCV: 89.7 fL (ref 80.0–100.0)
Platelets: 227 10*3/uL (ref 150–400)
RBC: 3.68 MIL/uL — ABNORMAL LOW (ref 3.87–5.11)
RDW: 15.6 % — ABNORMAL HIGH (ref 11.5–15.5)
WBC: 6.8 10*3/uL (ref 4.0–10.5)
nRBC: 0 % (ref 0.0–0.2)

## 2021-04-01 LAB — HEPARIN LEVEL (UNFRACTIONATED): Heparin Unfractionated: 0.52 IU/mL (ref 0.30–0.70)

## 2021-04-01 MED ORDER — ASPIRIN 81 MG PO CHEW
81.0000 mg | CHEWABLE_TABLET | ORAL | Status: AC
  Administered 2021-04-02: 81 mg via ORAL
  Filled 2021-04-01: qty 1

## 2021-04-01 MED ORDER — SODIUM CHLORIDE 0.9% FLUSH
3.0000 mL | Freq: Two times a day (BID) | INTRAVENOUS | Status: DC
  Administered 2021-04-01 – 2021-04-02 (×3): 3 mL via INTRAVENOUS

## 2021-04-01 MED ORDER — SODIUM CHLORIDE 0.9 % IV SOLN: 250.0000 mL | INTRAVENOUS | Status: DC | PRN

## 2021-04-01 MED ORDER — SODIUM CHLORIDE 0.9 % IV SOLN: INTRAVENOUS | Status: DC

## 2021-04-01 MED ORDER — SODIUM CHLORIDE 0.9% FLUSH: 3.0000 mL | INTRAVENOUS | Status: DC | PRN

## 2021-04-01 NOTE — H&P (View-Only) (Signed)
? ?Progress Note ? ?[name] ?Date of Encounter: 04/01/2021 ? ?[facility] Cardiologist: Cristopher Peru, MD  ? ?Subjective  ? ?Patient is alert and oriented. Denies any chest pain. Reports that her breathing has significantly improved and she believes she will be able to tolerate laying flat on her back for a heart cath. HR was well controlled overnight and was in the 80s, but did increase when patient walked in the halls this AM (140s while walking, 120s standing, 85-110s at rest)  ? ? ? ?Inpatient Medications  ?  ?Scheduled Meds: ? cephALEXin  500 mg Oral Q12H  ? Chlorhexidine Gluconate Cloth  6 each Topical Daily  ? dorzolamide-timolol  1 drop Both Eyes BID  ? feeding supplement  237 mL Oral BID BM  ? latanoprost  1 drop Both Eyes QHS  ? metoprolol succinate  50 mg Oral Daily  ? multivitamin with minerals  1 tablet Oral Daily  ? pantoprazole  40 mg Oral Daily  ? polyethylene glycol  17 g Oral Daily  ? sodium chloride flush  3 mL Intravenous Q12H  ? tamsulosin  0.4 mg Oral Daily  ? ?Continuous Infusions: ? sodium chloride    ? amiodarone 30 mg/hr (04/01/21 0539)  ? heparin 1,100 Units/hr (03/31/21 2012)  ? ?PRN Meds: ?sodium chloride, acetaminophen, albuterol, diclofenac Sodium, senna, sodium chloride flush  ? ?Vital Signs  ?  ?Vitals:  ? 03/31/21 1100 03/31/21 1539 03/31/21 2028 04/01/21 0532  ?BP: 134/76 111/84 127/84 (!) 153/91  ?Pulse: 93 97 96 92  ?Resp: _0 ?Temp: 97.8 ?F (36.6 ?C) 98.5 ?F (36.9 ?C) 98.8 ?F (37.1 ?C) (!) 96.8 ?F (36 ?C)  ?TempSrc: Oral Axillary Axillary Axillary  ?SpO2: 100% 100% 98%   ?Weight:    75.8 kg  ?Height:      ? ? ?Intake/Output Summary (Last 24 hours) at 04/01/2021 0752 ?Last data filed at 04/01/2021 0430 ?Gross per 24 hour  ?Intake --  ?Output 975 ml  ?Net -975 ml  ? ?Last 3 Weights 04/01/2021 03/31/2021 03/29/2021  ?Weight (lbs) 167 lb 1.7 oz 174 lb 2.6 oz 163 lb 4.8 oz  ?Weight (kg) 75.8 kg 79 kg 74.072 kg  ?   ? ?Telemetry  ?  ?Atrial fibrillation, HR  in the 80s overnight, increased to 110s-120s this AM - Personally Reviewed ? ?ECG  ?  ?No new tracings since 03/21/21 - Personally Reviewed ? ?Physical Exam  ? ?GEN: No acute distress.   ?Neck: No JVD ?Cardiac: Irregular rate and rhythm, tachycardic. Grade 2/6 systolic murmur at RUSB, +mechanical click  ?Respiratory: Clear to auscultation bilaterally. ?GI: Soft, nontender, non-distended  ?MS: No edema; No deformity. ?Neuro:  Nonfocal  ?Psych: Normal affect  ? ?Labs  ?  ?High Sensitivity Troponin:   ?Recent Labs  ?Lab 03/20/21 ?1340 03/20/21 ?1647  ?TROPONINIHS 14 14  ?   ?Chemistry ?Recent Labs  ?Lab 03/30/21 ?0214 03/31/21 ?0123 04/01/21 ?8016  ?NA 138 138 140  ?K 3.7 3.7 3.7  ?CL 106 107 109  ?CO2 _1 ?GLUCOSE 100* 108* 110*  ?BUN 68* 62* 46*  ?CREATININE 2.76* 2.21* 1.89*  ?CALCIUM 9.1 8.9 9.1  ?MG  --  1.9  --   ?GFRNONAA 17* 22* 26*  ?ANIONGAP _2 ?  ?Lipids No results for input(s): CHOL, TRIG, HDL, LABVLDL, LDLCALC, CHOLHDL in the last 168 hours.  ?Hematology ?Recent Labs  ?Lab 03/30/21 ?0214 03/31/21 ?0123 04/01/21 ?5537  ?WBC 8.3  6.0 6.8  ?RBC 4.18 3.67* 3.68*  ?HGB 11.5* 10.0* 10.2*  ?HCT 37.7 32.5* 33.0*  ?MCV 90.2 88.6 89.7  ?MCH 27.5 27.2 27.7  ?MCHC 30.5 30.8 30.9  ?RDW 15.5 15.5 15.6*  ?PLT 210 225 227  ? ?Thyroid No results for input(s): TSH, FREET4 in the last 168 hours.  ?BNP ?Recent Labs  ?Lab 03/29/21 ?0214  ?BNP 513.6*  ?  ?DDimer No results for input(s): DDIMER in the last 168 hours.  ? ?Radiology  ?  ?No results found. ? ?Cardiac Studies  ? ?Echocardiogram 03/21/21 ? 1. Left ventricular ejection fraction, by estimation, is 35 to 40%. The  ?left ventricle has moderately decreased function. The left ventricle  ?demonstrates global hypokinesis. There is moderate left ventricular  ?hypertrophy. Left ventricular diastolic  ?function could not be evaluated.  ? 2. Right ventricular systolic function is mildly reduced. The right  ?ventricular size is normal.  ? 3. Left atrial size was Massively  dilated at 126 ml/m2.  ? 4. Right atrial size was severely dilated.  ? 5. The mitral valve has been repaired/replaced. Trivial mitral valve  ?regurgitation. No evidence of mitral stenosis. There is a St. Jude  ?mechanical valve present in the mitral position. Procedure Date: 23.  ?Echo findings are consistent with normal  ?structure and function of the mitral valve prosthesis.  ? 6. The aortic valve is calcified. Aortic valve regurgitation is not  ?visualized. Mild to moderate aortic valve stenosis. Aortic valve area, by  ?VTI measures 1.48 cm?Marland Kitchen Aortic valve mean gradient measures 8.0 mmHg.  ?Aortic valve Vmax measures 1.94 m/s.  ? ?Comparison(s): Changes from prior study are noted. 07/25/2020: LVEF 50-55%.  ? ?Patient Profile  ?   ?86 y.o. female with PMH of chronic diastolic CHF, chronic atrial fibrillation on coumadin, St Jude mechanical MVR 1987, mild AS, NSVT, RBBB, HTN, CKD stage III, GERD and chronic anemia who presented on 3/1 with worsening dyspnea for 3 months. BNP elevated at 1633 with course complicated by ARF out-of-proportion to diuresis with possible element of obstructive etiology given improvement with foley placement. Also with Afib with RVR. Cardiology is consulted for HFrEF and Afib ? ?Assessment & Plan  ?  ?Acute on chronic combined systolic and diastolic heart failure  ?- Echo this admission showed LVEF 35-40% (down from 50-55% in 07/2020), global hypokinesis, moderate LVH, mild RV systolic dysfunction, severe biatrial enlargement, mechanical MVR with normal function, mild-to-moderate AS. Patient initially started on Lasix, but it was discontinued due to worsening renal function with concern for possible obstruction given brisk urine output after foley placement. No hydronephrosis on ulstrasound, renal function initially improved after foley placement, but then bumped again slightly likely due to afib with soft BP. Now that afib is better controlled and BP improving, renal function is improving.   ?- Given patient's new drop in EF, difficult diuresis due to renal function/possible bladder obstruction, patient would benefit from a RHC/LHC once renal function is more stable. Creatinine down to 1.89, eGFR 26 this AM. Will discuss with MD if renal function stable enough for cath today  ?- Patient not currently on diuretics, appears euvolemic on exam. Breathing has significantly improved since admission, able to tolerate laying flat and walking in the halls without SOB.  ?- Imdur and hydralazine held due to soft BP, can add back if patient becomes hypertensive  ?- Continue metoprolol 104m XL daily ?- Cannot add ACE/ARNI/ARB or spironolactone due to renal function. Would like to be able to add as renal  function improves, but may not be able to as her baseline creatinine is 1.5. Could consider adding an SGLT2i if GFR stays above 20  ?- Continue to monitor I/Os and daily weights ? ?AKI on CKD stage IIIa ?- Patient developed significant AKI, suspected to be related to bladder outlet obstruction as patient had good diuresis after foley placement. Renal ultrasound with no hydronephrosis. Renal function initially improved after foley placement, but then rose again likely due to afib with RVR with soft BP.  ?- Creatinine is downtrending, down to 1.89 this AM  ?- Avoid nephrotoxic medications ?- Discuss with MD if renal function has improved enough to proceed with LHC/RHC today or if we should allow another day for renal function to stabilize  ? ?Saint Jude mechanical mitral valve placed in 1987: ?- Echo this admission with normal functioning mechanical valve. +mechanical click on physical exam  ?- Routine follow-up as outpatient ?- Warfarin held given plans for possible LHC/RHC ?- Continue heparin gtt for now ? ?Persistent atrial fibrillation  ?- HR is better controlled, but continues to be tachycardic on movement  ?-Will continue amiodarone gtt for today, possibly PO tomorrow ?-Once transition to PO, will plan for  amiodarone 469m BID x7 days, 2031mBID x7 days, then 2006maily thereafter ?-Continue metop 59m48m daily, consider increasing for improved HR control now that BP is stable  ?-Warfarin held given plans f

## 2021-04-01 NOTE — Progress Notes (Signed)
Family Medicine Teaching Service ?Daily Progress Note ?Intern Pager: [phone] ? ?[name] Medical record number: [id number] ?Date of birth: 07/24/1935 Age: 86 y.o. Gender: female ? ?Primary Care Provider: Dickie La, MD ?Consultants: Cardiology, Palliative, Urology ?Code Status: Full ? ?Pt Overview and Major Events to Date:  ?3/1 - admitted ? ?Assessment and Plan: ?Patient is an 86 year old female who presented with dyspnea on exertion and chest pain and has been awaiting heart cath which has been delayed due to AKI.  PMH significant for HFpEF (now HFrEF), s/p prosthetic mitral valve on Coumadin, permanent A-fib, HTN, CKD stage III, osteoporosis, history of right hemicolectomy, GERD ? ?AKI on CKDIII: Improved  Urinary retention ?Cr improved from 2.21 to 1.89. Net UOP 958mL in the last 24 hours.  ?-Continue Keflex (day 4/5) ?-Continue Flomax 0.4mg  daily ?-f/u with [facility] outpatient for repeat void trial and urodynamics ?-daily BMP to monitor Cr ? ?HFrEF: Stable ?Stable vitals. Awaiting heart cath. ?-Cardiology following, patient recommendations ?-Heart cath today ?-Continue Toprol-XL 50 mg daily ? ?Persistent A-fib: Stable  s/p mechanical mitral valve ?Controlled HR overnight. ?-Cardiology following, appreciate recommendations ?-Continue heparin drip, heart cath today ?-Continue amiodarone drip per cardiology, will switch to oral when advised per cardiology ?-Continue Toprol-XL 50 mg daily ? ?FEN/GI: NPO for pending heart cath ?PPx: Heparin drip ?Dispo:Home pending clinical improvement . Barriers include heart cath.  ? ?Subjective:  ?No acute events overnight and patient states she is doing well. ? ?Objective: ?Temp:  [96.8 ?F (36 ?C)-98.8 ?F (37.1 ?C)] 96.8 ?F (36 ?C) (03/13 0532) ?Pulse Rate:  [92-97] 92 (03/13 0532) ?Resp:  [16-18] 16 (03/13 0532) ?BP: (111-153)/(76-91) 153/91 (03/13 0532) ?SpO2:  [98 %-100 %] 98 % (03/12 2028) ?Weight:  [75.8 kg] 75.8 kg (03/13 0532) ?Physical  Exam: ?General: Awake, alert, NAD ?Cardiovascular: Irregularly irregular, no murmurs auscultated ?Respiratory: CTAB, no increased work of breathing or crackles/wheezing ? ?Laboratory: ?Recent Labs  ?Lab 03/30/21 ?0214 03/31/21 ?0123 04/01/21 ?[id number]  ?WBC 8.3 6.0 6.8  ?HGB 11.5* 10.0* 10.2*  ?HCT 37.7 32.5* 33.0*  ?PLT 210 225 227  ? ?Recent Labs  ?Lab 03/30/21 ?0214 03/31/21 ?0123 04/01/21 ?[id number]  ?NA 138 138 140  ?K 3.7 3.7 3.7  ?CL 106 107 109  ?CO2 22 23 24   ?BUN 68* 62* 46*  ?CREATININE 2.76* 2.21* 1.89*  ?CALCIUM 9.1 8.9 9.1  ?GLUCOSE 100* 108* 110*  ? ? ?Imaging/Diagnostic Tests: ?No results found. ? ?Wells Guiles, DO ?04/01/2021, 7:24 AM ?PGY-1, [facility] ?[place] Intern pager: [phone], text pages welcome ? ?

## 2021-04-01 NOTE — Progress Notes (Signed)
Occupational Therapy Treatment ?Patient Details ?Name: [name] ?MRN: [id number] ?DOB: [birth year] ?Today's Date: 04/01/2021 ? ? ?History of present illness 86 yo admitted 3/1 with DOE and CP. Pt with acute hypoxic respiratory failure secondary to new HFrEF, with acute renal failure. Pt with UTI and urinary retention acutely.  PMHx:HFpEF, s/p MVR, permanent Afib, HTN, CKD stage III, osteoporosis, Rt hemicolectomy, cirrhosis, GERD ?  ?OT comments ? Pt declined OOB and ADL training, but agreed to energy conservation education. Pt able to verbalize 3 strategies at end of session. Reinforced with written handout. Pt reports already implementing sitting down while cooking and pacing. Updated goal for pt to be able to generalize strategies.   ? ?Recommendations for follow up therapy are one component of a multi-disciplinary discharge planning process, led by the attending physician.  Recommendations may be updated based on patient status, additional functional criteria and insurance authorization. ?   ?Follow Up Recommendations ? Home health OT  ?  ?Assistance Recommended at Discharge Intermittent Supervision/Assistance  ?Patient can return home with the following ? A little help with walking and/or transfers;A little help with bathing/dressing/bathroom;Assistance with cooking/housework;Assist for transportation;Help with stairs or ramp for entrance ?  ?Equipment Recommendations ? None recommended by OT  ?  ?Recommendations for Other Services   ? ?  ?Precautions / Restrictions Precautions ?Precautions: Fall ?Precaution Comments: watch HR  ? ? ?  ? ?Mobility Bed Mobility ?Overal bed mobility: Modified Independent ?  ?  ?  ?  ?  ?  ?  ?  ? ?Transfers ?  ?  ?  ?  ?  ?  ?  ?  ?  ?  ?  ?  ?Balance   ?  ?  ?  ?  ?  ?  ?  ?  ?  ?  ?  ?  ?  ?  ?  ?  ?  ?  ?   ? ?ADL either performed or assessed with clinical judgement  ? ?ADL   ?  ?  ?  ?  ?  ?  ?  ?  ?  ?  ?  ?  ?  ?  ?  ?  ?  ?  ?  ?General ADL Comments: Focus of session on  educating pt in energy conservation strategies, pt declined ADL training. ?  ? ?Extremity/Trunk Assessment   ?  ?  ?  ?  ?  ? ?Vision   ?  ?  ?Perception   ?  ?Praxis   ?  ? ?Cognition Arousal/Alertness: Awake/alert ?Behavior During Therapy: [facility] for tasks assessed/performed ?Overall Cognitive Status: Within Functional Limits for tasks assessed ?  ?  ?  ?  ?  ?  ?  ?  ?  ?  ?  ?  ?  ?  ?  ?  ?  ?  ?  ?   ?Exercises   ? ?  ?Shoulder Instructions   ? ? ?  ?General Comments    ? ? ?Pertinent Vitals/ Pain       Pain Assessment ?Pain Assessment: No/denies pain ? ?Home Living   ?  ?  ?  ?  ?  ?  ?  ?  ?  ?  ?  ?  ?  ?  ?  ?  ?  ?  ? ?  ?Prior Functioning/Environment    ?  ?  ?  ?   ? ?Frequency ? Min 2X/week  ? ? ? ? ?  ?  Progress Toward Goals ? ?OT Goals(current goals can now be found in the care plan section) ? Progress towards OT goals: Progressing toward goals ? ?Acute Rehab OT Goals ?OT Goal Formulation: With patient ?Time For Goal Achievement: 04/10/21 ?Potential to Achieve Goals: Good ?ADL Goals ?Additional ADL Goal #1: Pt will generalize energy conservation strategies during ADLs and mobility.  ?Plan Discharge plan remains appropriate   ? ?Co-evaluation ? ? ?   ?  ?  ?  ?  ? ?  ?AM-PAC OT "6 Clicks" Daily Activity     ?Outcome Measure ? ? Help from another person eating meals?: None ?Help from another person taking care of personal grooming?: A Little ?Help from another person toileting, which includes using toliet, bedpan, or urinal?: A Little ?Help from another person bathing (including washing, rinsing, drying)?: A Little ?Help from another person to put on and taking off regular upper body clothing?: None ?Help from another person to put on and taking off regular lower body clothing?: A Little ?6 Click Score: 20 ? ?  ?End of Session   ? ?OT Visit Diagnosis: Unsteadiness on feet (R26.81);Other abnormalities of gait and mobility (R26.89);Muscle weakness (generalized) (M62.81);Other (comment) ?  ?Activity Tolerance  Patient tolerated treatment well ?  ?Patient Left in bed;with call bell/phone within reach ?  ?Nurse Communication   ?  ? ?   ? ?Time: [id number] ?OT Time Calculation (min): 20 min ? ?Charges: OT General Charges ?$OT Visit: 1 Visit ?OT Treatments ?$Self Care/Home Management : 8-22 mins ? ?Nestor Lewandowsky, OTR/L ?Acute Rehabilitation Services ?Pager: [phone] ?Office: [phone]  ? ?[name] ?04/01/2021, 2:40 PM ?

## 2021-04-01 NOTE — Progress Notes (Signed)
ANTICOAGULATION CONSULT NOTE - Follow Up Consult ? ?Pharmacy Consult for warfarin>heparin ?Indication:  mechanical mitral valve ? ?Allergies  ?Allergen Reactions  ? Iodinated Contrast Media Rash  ?  Pt stated in the past had broken out in a rash on back and itching.   ? Esomeprazole Magnesium   ?  REACTION: stomach upset, nausea  ? ? ?Patient Measurements: ?Height: 5\' 8"  (172.7 cm) ?Weight: 75.8 kg (167 lb 1.7 oz) ?IBW/kg (Calculated) : 63.9 ?Heparin Dosing Weight: 73.1 kg ? ?Vital Signs: ?Temp: 96.8 ?F (36 ?C) (03/13 0532) ?Temp Source: Axillary (03/13 0532) ?BP: 153/91 (03/13 0532) ?Pulse Rate: 92 (03/13 0532) ? ?Labs: ?Recent Labs  ?  03/30/21 ?0214 03/31/21 ?0123 04/01/21 ?0156  ?HGB 11.5* 10.0* 10.2*  ?HCT 37.7 32.5* 33.0*  ?PLT 210 225 227  ?HEPARINUNFRC 0.41 0.51 0.52  ?CREATININE 2.76* 2.21* 1.89*  ? ? ? ?Estimated Creatinine Clearance: 22.8 mL/min (A) (by C-G formula based on SCr of 1.89 mg/dL (H)). ? ? ?Assessment: ?86 yo F with a history of mechanical mitral valve replacement/PAF, on long-term anticoagulation with warfarin. PTA warfarin dosing: 5 mg Sun/Tues/Thurs, 10 mg all other days (last dose of warfarin 3/1). Pharmacy consulted for heparin dosing.  ?  ?Was scheduled for [facility] 3/9, however patient is experienced continued rise in Scr and cath has been post-poned. Possible cath today.  ? ?Heparin level therapeutic at 0.52 with heparin infusing at 1100 units/hour. Hgb stable 10s, platelets are within normal limits. No signs of bleeding or IV site issues noted.  ? ?Of note, patient has been started on amiodarone for rhythm control while inpatient. She should have close outpatient follow-up with [facility] to ensure appropriate warfarin dosing as amiodarone can increase INR and patient has been stable on her home regimen for many years.  ? ?Goal of Therapy:  ?Heparin level 0.3-0.7 units/ml ?Monitor platelets by anticoagulation protocol: Yes ?  ?Plan:  ?Continue heparin infusion at 1100  units/hr ?Daily heparin level, CBC  ?Continue to monitor H&H and platelets ?F/U cardiology recs and restarting home warfarin ? ?Thank you for involving pharmacy in this patient's care. ? ?5/9, PharmD, BCPS ?Clinical Pharmacist ?Clinical phone for 04/01/2021 until 3p is [phone] ?04/01/2021 9:36 AM ? ?**Pharmacist phone directory can be found on [email or web address] listed under [facility]** ? ?

## 2021-04-01 NOTE — Progress Notes (Signed)
Physical Therapy Treatment ?Patient Details ?Name: [name] ?MRN: [id number] ?DOB: 18-Jul-1935 ?Today's Date: 04/01/2021 ? ? ?History of Present Illness 86 yo admitted 3/1 with DOE and CP. Pt with acute hypoxic respiratory failure secondary to new HFrEF, with acute renal failure. Pt with UTI and urinary retention acutely.  PMHx:HFpEF, s/p MVR, permanent Afib, HTN, CKD stage III, osteoporosis, Rt hemicolectomy, cirrhosis, GERD ? ?  ?PT Comments  ? ? PT pleasant and awaiting possible cath today. Pt with increased gait tolerance this session with HR 120-140 during gait and HR 85-110 at rest. Sats >90% on RA and BP 138/91. Pt educated for HEP, LOB during gait and continued progression.  ?  ?Recommendations for follow up therapy are one component of a multi-disciplinary discharge planning process, led by the attending physician.  Recommendations may be updated based on patient status, additional functional criteria and insurance authorization. ? ?Follow Up Recommendations ? Home health PT ?  ?  ?Assistance Recommended at Discharge Intermittent Supervision/Assistance  ?Patient can return home with the following Assistance with cooking/housework;Assist for transportation;A little help with walking and/or transfers ?  ?Equipment Recommendations ? None recommended by PT  ?  ?Recommendations for Other Services   ? ? ?  ?Precautions / Restrictions Precautions ?Precautions: Fall ?Precaution Comments: watch HR ?Restrictions ?Weight Bearing Restrictions: (P) No  ?  ? ?Mobility ? Bed Mobility ?Overal bed mobility: Modified Independent ?  ?  ?  ?  ?  ?  ?General bed mobility comments: increased time with bed flat ?  ? ?Transfers ?Overall transfer level: Needs assistance ?  ?Transfers: Sit to/from Stand ?Sit to Stand: Supervision ?  ?  ?  ?  ?  ?General transfer comment: cues for hand placement ?  ? ?Ambulation/Gait ?Ambulation/Gait assistance: Min guard ?Gait Distance (Feet): 380 Feet ?Assistive device: Rolling walker (2  wheels) ?Gait Pattern/deviations: Step-through pattern, Decreased stride length ?  ?Gait velocity interpretation: 1.31 - 2.62 ft/sec, indicative of limited community ambulator ?  ?General Gait Details: rounded shoulders with increased gait distance with HR 120-140 during gait today. Pt with standing rest halfway through gait with resting HR 120 and max 140 during gait. Pt with posterior LOB with single UE support during gait with guarding to recover. ? ? ?Stairs ?  ?  ?  ?  ?  ? ? ?Wheelchair Mobility ?  ? ?Modified Rankin (Stroke Patients Only) ?  ? ? ?  ?Balance Overall balance assessment: Needs assistance ?Sitting-balance support: No upper extremity supported ?Sitting balance-Leahy Scale: Good ?Sitting balance - Comments: EOb without UE support ?  ?Standing balance support: Bilateral upper extremity supported ?Standing balance-Leahy Scale: Poor ?Standing balance comment: RW for gait, posterior LOB with single UE support ?  ?  ?  ?  ?  ?  ?  ?  ?  ?  ?  ?  ? ?  ?Cognition Arousal/Alertness: Awake/alert ?Behavior During Therapy: [facility] for tasks assessed/performed ?Overall Cognitive Status: Within Functional Limits for tasks assessed ?  ?  ?  ?  ?  ?  ?  ?  ?  ?  ?  ?  ?  ?  ?  ?  ?  ?  ?  ? ?  ?Exercises General Exercises - Lower Extremity ?Long Arc Quad: AROM, Both, Seated, 20 reps ?Hip ABduction/ADduction: AAROM, Both, Seated, 20 reps ?Hip Flexion/Marching: AROM, Both, Seated, 20 reps ?Toe Raises: AROM, Both, Seated, 20 reps ?Heel Raises: AROM, Both, Seated, 20 reps ? ?  ?General Comments   ?  ?  ? ?  Pertinent Vitals/Pain Pain Assessment ?Pain Assessment: No/denies pain  ? ? ?Home Living   ?  ?  ?  ?  ?  ?  ?  ?  ?  ?   ?  ?Prior Function    ?  ?  ?   ? ?PT Goals (current goals can now be found in the care plan section) Progress towards PT goals: Progressing toward goals ? ?  ?Frequency ? ? ? Min 3X/week ? ? ? ?  ?PT Plan Current plan remains appropriate  ? ? ?Co-evaluation   ?  ?  ?  ?  ? ?  ?AM-PAC PT "6 Clicks"  Mobility   ?Outcome Measure ? Help needed turning from your back to your side while in a flat bed without using bedrails?: None ?Help needed moving from lying on your back to sitting on the side of a flat bed without using bedrails?: None ?Help needed moving to and from a bed to a chair (including a wheelchair)?: A Little ?Help needed standing up from a chair using your arms (e.g., wheelchair or bedside chair)?: A Little ?Help needed to walk in hospital room?: A Little ?Help needed climbing 3-5 steps with a railing? : A Little ?6 Click Score: 20 ? ?  ?End of Session   ?Activity Tolerance: Patient tolerated treatment well ?Patient left: in chair;with call bell/phone within reach;with chair alarm set ?Nurse Communication: Mobility status ?PT Visit Diagnosis: Other abnormalities of gait and mobility (R26.89);Muscle weakness (generalized) (M62.81) ?  ? ? ?Time: [id number] ?PT Time Calculation (min) (ACUTE ONLY): 34 min ? ?Charges:  $Gait Training: 8-22 mins ?$Therapeutic Exercise: 8-22 mins          ?          ? ?{Kyre Jeffries P, PT ?Acute Rehabilitation Services ?Pager: [phone] ?Office: [phone] ? ? ? ?Bodin Gorka B Gisella Alwine ?04/01/2021, 10:06 AM ? ?

## 2021-04-01 NOTE — Progress Notes (Addendum)
Progress Note  [name] Date of Encounter: 04/01/2021  [facility] Cardiologist: Cristopher Peru, MD   Subjective   Patient is alert and oriented. Denies any chest pain. Reports that her breathing has significantly improved and she believes she will be able to tolerate laying flat on her back for a heart cath. HR was well controlled overnight and was in the 80s, but did increase when patient walked in the halls this AM (140s while walking, 120s standing, 85-110s at rest)     Inpatient Medications    Scheduled Meds:  cephALEXin  500 mg Oral Q12H   Chlorhexidine Gluconate Cloth  6 each Topical Daily   dorzolamide-timolol  1 drop Both Eyes BID   feeding supplement  237 mL Oral BID BM   latanoprost  1 drop Both Eyes QHS   metoprolol succinate  50 mg Oral Daily   multivitamin with minerals  1 tablet Oral Daily   pantoprazole  40 mg Oral Daily   polyethylene glycol  17 g Oral Daily   sodium chloride flush  3 mL Intravenous Q12H   tamsulosin  0.4 mg Oral Daily   Continuous Infusions:  sodium chloride     amiodarone 30 mg/hr (04/01/21 0539)   heparin 1,100 Units/hr (03/31/21 2012)   PRN Meds: sodium chloride, acetaminophen, albuterol, diclofenac Sodium, senna, sodium chloride flush   Vital Signs    Vitals:   03/31/21 1100 03/31/21 1539 03/31/21 2028 04/01/21 0532  BP: 134/76 111/84 127/84 (!) 153/91  Pulse: 93 97 96 92  Resp: '16 18 18 16  ' Temp: 97.8 F (36.6 C) 98.5 F (36.9 C) 98.8 F (37.1 C) (!) 96.8 F (36 C)  TempSrc: Oral Axillary Axillary Axillary  SpO2: 100% 100% 98%   Weight:    75.8 kg  Height:        Intake/Output Summary (Last 24 hours) at 04/01/2021 0752 Last data filed at 04/01/2021 0430 Gross per 24 hour  Intake --  Output 975 ml  Net -975 ml   Last 3 Weights 04/01/2021 03/31/2021 03/29/2021  Weight (lbs) 167 lb 1.7 oz 174 lb 2.6 oz 163 lb 4.8 oz  Weight (kg) 75.8 kg 79 kg 74.072 kg      Telemetry    Atrial fibrillation, HR  in the 80s overnight, increased to 110s-120s this AM - Personally Reviewed  ECG    No new tracings since 03/21/21 - Personally Reviewed  Physical Exam   GEN: No acute distress.   Neck: No JVD Cardiac: Irregular rate and rhythm, tachycardic. Grade 2/6 systolic murmur at RUSB, +mechanical click  Respiratory: Clear to auscultation bilaterally. GI: Soft, nontender, non-distended  MS: No edema; No deformity. Neuro:  Nonfocal  Psych: Normal affect   Labs    High Sensitivity Troponin:   Recent Labs  Lab 03/20/21 1340 03/20/21 1647  TROPONINIHS 14 14     Chemistry Recent Labs  Lab 03/30/21 0214 03/31/21 0123 04/01/21 0156  NA 138 138 140  K 3.7 3.7 3.7  CL 106 107 109  CO2 '22 23 24  ' GLUCOSE 100* 108* 110*  BUN 68* 62* 46*  CREATININE 2.76* 2.21* 1.89*  CALCIUM 9.1 8.9 9.1  MG  --  1.9  --   GFRNONAA 17* 22* 26*  ANIONGAP '10 8 7    ' Lipids No results for input(s): CHOL, TRIG, HDL, LABVLDL, LDLCALC, CHOLHDL in the last 168 hours.  Hematology Recent Labs  Lab 03/30/21 0214 03/31/21 0123 04/01/21 0156  WBC 8.3  6.0 6.8  RBC 4.18 3.67* 3.68*  HGB 11.5* 10.0* 10.2*  HCT 37.7 32.5* 33.0*  MCV 90.2 88.6 89.7  MCH 27.5 27.2 27.7  MCHC 30.5 30.8 30.9  RDW 15.5 15.5 15.6*  PLT 210 225 227   Thyroid No results for input(s): TSH, FREET4 in the last 168 hours.  BNP Recent Labs  Lab 03/29/21 0214  BNP 513.6*    DDimer No results for input(s): DDIMER in the last 168 hours.   Radiology    No results found.  Cardiac Studies   Echocardiogram 03/21/21  1. Left ventricular ejection fraction, by estimation, is 35 to 40%. The  left ventricle has moderately decreased function. The left ventricle  demonstrates global hypokinesis. There is moderate left ventricular  hypertrophy. Left ventricular diastolic  function could not be evaluated.   2. Right ventricular systolic function is mildly reduced. The right  ventricular size is normal.   3. Left atrial size was Massively  dilated at 126 ml/m2.   4. Right atrial size was severely dilated.   5. The mitral valve has been repaired/replaced. Trivial mitral valve  regurgitation. No evidence of mitral stenosis. There is a [employer] Jude  mechanical valve present in the mitral position. Procedure Date: 85.  Echo findings are consistent with normal  structure and function of the mitral valve prosthesis.   6. The aortic valve is calcified. Aortic valve regurgitation is not  visualized. Mild to moderate aortic valve stenosis. Aortic valve area, by  VTI measures 1.48 cm. Aortic valve mean gradient measures 8.0 mmHg.  Aortic valve Vmax measures 1.94 m/s.   Comparison(s): Changes from prior study are noted. 07/25/2020: LVEF 50-55%.   Patient Profile     86 y.o. female with PMH of chronic diastolic CHF, chronic atrial fibrillation on coumadin, [employer] Jude mechanical MVR 1987, mild AS, NSVT, RBBB, HTN, CKD stage III, GERD and chronic anemia who presented on 3/1 with worsening dyspnea for 3 months. BNP elevated at 1633 with course complicated by ARF out-of-proportion to diuresis with possible element of obstructive etiology given improvement with foley placement. Also with Afib with RVR. Cardiology is consulted for HFrEF and Afib  Assessment & Plan    Acute on chronic combined systolic and diastolic heart failure  - Echo this admission showed LVEF 35-40% (down from 50-55% in 07/2020), global hypokinesis, moderate LVH, mild RV systolic dysfunction, severe biatrial enlargement, mechanical MVR with normal function, mild-to-moderate AS. Patient initially started on Lasix, but it was discontinued due to worsening renal function with concern for possible obstruction given brisk urine output after foley placement. No hydronephrosis on ulstrasound, renal function initially improved after foley placement, but then bumped again slightly likely due to afib with soft BP. Now that afib is better controlled and BP improving, renal function is improving.   - Given patient's new drop in EF, difficult diuresis due to renal function/possible bladder obstruction, patient would benefit from a RHC/LHC once renal function is more stable. Creatinine down to 1.89, eGFR 26 this AM. Will discuss with MD if renal function stable enough for cath today  - Patient not currently on diuretics, appears euvolemic on exam. Breathing has significantly improved since admission, able to tolerate laying flat and walking in the halls without SOB.  - Imdur and hydralazine held due to soft BP, can add back if patient becomes hypertensive  - Continue metoprolol 9m XL daily - Cannot add ACE/ARNI/ARB or spironolactone due to renal function. Would like to be able to add as renal  function improves, but may not be able to as her baseline creatinine is 1.5. Could consider adding an SGLT2i if GFR stays above 20  - Continue to monitor I/Os and daily weights  AKI on CKD stage IIIa - Patient developed significant AKI, suspected to be related to bladder outlet obstruction as patient had good diuresis after foley placement. Renal ultrasound with no hydronephrosis. Renal function initially improved after foley placement, but then rose again likely due to afib with RVR with soft BP.  - Creatinine is downtrending, down to 1.89 this AM  - Avoid nephrotoxic medications - Discuss with MD if renal function has improved enough to proceed with LHC/RHC today or if we should allow another day for renal function to stabilize   [facility] Jude mechanical mitral valve placed in 1987: - Echo this admission with normal functioning mechanical valve. +mechanical click on physical exam  - Routine follow-up as outpatient - Warfarin held given plans for possible LHC/RHC - Continue heparin gtt for now  Persistent atrial fibrillation  - HR is better controlled, but continues to be tachycardic on movement  -Will continue amiodarone gtt for today, possibly PO tomorrow -Once transition to PO, will plan for  amiodarone 473m BID x7 days, 2062mBID x7 days, then 20019maily thereafter -Continue metop 37m37m daily, consider increasing for improved HR control now that BP is stable  -Warfarin held given plans for possible LHC/RHC once Cr improved -Continue heparin gtt for now  Mild-to-Moderate AS  - Will need follow up as an outpatient   HTN  - BP has been low in the setting of Afib with RVR  - BP improving, was 138/91 this AM  - Continue metoprolol 50 mg XL daily   Otherwise per primary  - UTI  - Urinary retention       For questions or updates, please contact [facility] Please consult [email or web address] for contact info under        Signed, KathMargie Billet-C  04/01/2021, 7:52 AM

## 2021-04-02 ENCOUNTER — Encounter (HOSPITAL_COMMUNITY): Payer: Self-pay | Admitting: Internal Medicine

## 2021-04-02 ENCOUNTER — Encounter (HOSPITAL_COMMUNITY)
Admission: EM | Disposition: A | Discharge status: Home Health | Payer: Self-pay | Source: Ambulatory Visit | Attending: Family Medicine

## 2021-04-02 HISTORY — PX: RIGHT/LEFT HEART CATH AND CORONARY ANGIOGRAPHY: CATH118266

## 2021-04-02 LAB — POCT I-STAT EG7
Acid-Base Excess: 0 mmol/L (ref 0.0–2.0)
Acid-base deficit: 7 mmol/L — ABNORMAL HIGH (ref 0.0–2.0)
Bicarbonate: 18.1 mmol/L — ABNORMAL LOW (ref 20.0–28.0)
Bicarbonate: 25.4 mmol/L (ref 20.0–28.0)
Calcium, Ion: 0.74 mmol/L — CL (ref 1.15–1.40)
Calcium, Ion: 1.23 mmol/L (ref 1.15–1.40)
HCT: 25 % — ABNORMAL LOW (ref 36.0–46.0)
HCT: 32 % — ABNORMAL LOW (ref 36.0–46.0)
Hemoglobin: 10.9 g/dL — ABNORMAL LOW (ref 12.0–15.0)
Hemoglobin: 8.5 g/dL — ABNORMAL LOW (ref 12.0–15.0)
O2 Saturation: 53 %
O2 Saturation: 58 %
Potassium: 2.5 mmol/L — CL (ref 3.5–5.1)
Potassium: 3.7 mmol/L (ref 3.5–5.1)
Sodium: 142 mmol/L (ref 135–145)
Sodium: 148 mmol/L — ABNORMAL HIGH (ref 135–145)
TCO2: 19 mmol/L — ABNORMAL LOW (ref 22–32)
TCO2: 27 mmol/L (ref 22–32)
pCO2, Ven: 33.8 mmHg — ABNORMAL LOW (ref 44–60)
pCO2, Ven: 43.4 mmHg — ABNORMAL LOW (ref 44–60)
pH, Ven: 7.337 (ref 7.25–7.43)
pH, Ven: 7.374 (ref 7.25–7.43)
pO2, Ven: 29 mmHg — CL (ref 32–45)
pO2, Ven: 32 mmHg (ref 32–45)

## 2021-04-02 LAB — HEPARIN LEVEL (UNFRACTIONATED)
Heparin Unfractionated: 0.58 IU/mL (ref 0.30–0.70)
Heparin Unfractionated: 0.44 IU/mL (ref 0.30–0.70)

## 2021-04-02 LAB — BASIC METABOLIC PANEL
Anion gap: 8 (ref 5–15)
BUN: 33 mg/dL — ABNORMAL HIGH (ref 8–23)
CO2: 24 mmol/L (ref 22–32)
Calcium: 9 mg/dL (ref 8.9–10.3)
Chloride: 107 mmol/L (ref 98–111)
Creatinine, Ser: 1.81 mg/dL — ABNORMAL HIGH (ref 0.44–1.00)
GFR, Estimated: 27 mL/min — ABNORMAL LOW (ref >60)
Glucose, Bld: 109 mg/dL — ABNORMAL HIGH (ref 70–99)
Potassium: 3.8 mmol/L (ref 3.5–5.1)
Sodium: 139 mmol/L (ref 135–145)

## 2021-04-02 LAB — CBC
HCT: 33 % — ABNORMAL LOW (ref 36.0–46.0)
Hemoglobin: 10 g/dL — ABNORMAL LOW (ref 12.0–15.0)
MCH: 26.8 pg (ref 26.0–34.0)
MCHC: 30.3 g/dL (ref 30.0–36.0)
MCV: 88.5 fL (ref 80.0–100.0)
Platelets: 221 10*3/uL (ref 150–400)
RBC: 3.73 MIL/uL — ABNORMAL LOW (ref 3.87–5.11)
RDW: 15.7 % — ABNORMAL HIGH (ref 11.5–15.5)
WBC: 7.3 10*3/uL (ref 4.0–10.5)
nRBC: 0 % (ref 0.0–0.2)

## 2021-04-02 LAB — PROTIME-INR
INR: 1.1 (ref 0.8–1.2)
Prothrombin Time: 14.3 seconds (ref 11.4–15.2)

## 2021-04-02 SURGERY — RIGHT/LEFT HEART CATH AND CORONARY ANGIOGRAPHY
Anesthesia: LOCAL

## 2021-04-02 MED ORDER — IOHEXOL 350 MG/ML SOLN
INTRAVENOUS | Status: DC | PRN
  Administered 2021-04-02: 25 mL

## 2021-04-02 MED ORDER — ONDANSETRON HCL 4 MG/2ML IJ SOLN: 4.0000 mg | Freq: Four times a day (QID) | INTRAMUSCULAR | Status: DC | PRN

## 2021-04-02 MED ORDER — WARFARIN SODIUM 5 MG PO TABS
5.0000 mg | ORAL_TABLET | Freq: Once | ORAL | Status: AC
Start: 2021-04-02 — End: 2021-04-02
  Administered 2021-04-02: 5 mg via ORAL
  Filled 2021-04-02: qty 1

## 2021-04-02 MED ORDER — FUROSEMIDE 40 MG PO TABS
40.0000 mg | ORAL_TABLET | Freq: Every day | ORAL | Status: DC
  Administered 2021-04-02 – 2021-04-03 (×2): 40 mg via ORAL
  Filled 2021-04-02 (×2): qty 1

## 2021-04-02 MED ORDER — HEPARIN SODIUM (PORCINE) 1000 UNIT/ML IJ SOLN
INTRAMUSCULAR | Status: DC | PRN
  Administered 2021-04-02: 3000 [IU] via INTRAVENOUS

## 2021-04-02 MED ORDER — MIDAZOLAM HCL 2 MG/2ML IJ SOLN
INTRAMUSCULAR | Status: AC
  Filled 2021-04-02: qty 2

## 2021-04-02 MED ORDER — LIDOCAINE HCL (PF) 1 % IJ SOLN
INTRAMUSCULAR | Status: AC
  Filled 2021-04-02: qty 30

## 2021-04-02 MED ORDER — METOPROLOL SUCCINATE ER 100 MG PO TB24
100.0000 mg | ORAL_TABLET | Freq: Every day | ORAL | Status: DC
  Administered 2021-04-02 – 2021-04-03 (×2): 100 mg via ORAL
  Filled 2021-04-02 (×2): qty 1

## 2021-04-02 MED ORDER — SODIUM CHLORIDE 0.9% FLUSH
3.0000 mL | Freq: Two times a day (BID) | INTRAVENOUS | Status: DC
  Administered 2021-04-02 – 2021-04-03 (×3): 3 mL via INTRAVENOUS

## 2021-04-02 MED ORDER — FENTANYL CITRATE (PF) 100 MCG/2ML IJ SOLN
INTRAMUSCULAR | Status: AC
  Filled 2021-04-02: qty 2

## 2021-04-02 MED ORDER — AMIODARONE HCL 200 MG PO TABS
400.0000 mg | ORAL_TABLET | Freq: Two times a day (BID) | ORAL | Status: DC
  Administered 2021-04-02 – 2021-04-03 (×3): 400 mg via ORAL
  Filled 2021-04-02 (×3): qty 2

## 2021-04-02 MED ORDER — ACETAMINOPHEN 325 MG PO TABS: 650.0000 mg | ORAL_TABLET | ORAL | Status: DC | PRN

## 2021-04-02 MED ORDER — SODIUM CHLORIDE 0.9 % IV SOLN: 250.0000 mL | INTRAVENOUS | Status: DC | PRN

## 2021-04-02 MED ORDER — HEPARIN (PORCINE) 25000 UT/250ML-% IV SOLN
1100.0000 [IU]/h | INTRAVENOUS | Status: AC
  Administered 2021-04-02 – 2021-04-03 (×2): 1100 [IU]/h via INTRAVENOUS
  Filled 2021-04-02: qty 250

## 2021-04-02 MED ORDER — VERAPAMIL HCL 2.5 MG/ML IV SOLN
INTRAVENOUS | Status: DC | PRN
  Administered 2021-04-02: 10 mL via INTRA_ARTERIAL

## 2021-04-02 MED ORDER — FENTANYL CITRATE (PF) 100 MCG/2ML IJ SOLN
INTRAMUSCULAR | Status: DC | PRN
  Administered 2021-04-02: 25 ug via INTRAVENOUS

## 2021-04-02 MED ORDER — DIPHENHYDRAMINE HCL 25 MG PO CAPS
25.0000 mg | ORAL_CAPSULE | Freq: Once | ORAL | Status: AC
  Administered 2021-04-02: 25 mg via ORAL
  Filled 2021-04-02: qty 1

## 2021-04-02 MED ORDER — HEPARIN (PORCINE) IN NACL 1000-0.9 UT/500ML-% IV SOLN
INTRAVENOUS | Status: AC
  Filled 2021-04-02: qty 500

## 2021-04-02 MED ORDER — HEPARIN (PORCINE) IN NACL 1000-0.9 UT/500ML-% IV SOLN
INTRAVENOUS | Status: DC | PRN
  Administered 2021-04-02 (×2): 500 mL

## 2021-04-02 MED ORDER — HYDRALAZINE HCL 20 MG/ML IJ SOLN: 10.0000 mg | INTRAMUSCULAR | Status: DC | PRN

## 2021-04-02 MED ORDER — VERAPAMIL HCL 2.5 MG/ML IV SOLN
INTRAVENOUS | Status: AC
  Filled 2021-04-02: qty 2

## 2021-04-02 MED ORDER — MIDAZOLAM HCL 2 MG/2ML IJ SOLN
INTRAMUSCULAR | Status: DC | PRN
  Administered 2021-04-02: 1 mg via INTRAVENOUS

## 2021-04-02 MED ORDER — SODIUM CHLORIDE 0.9% FLUSH: 3.0000 mL | INTRAVENOUS | Status: DC | PRN

## 2021-04-02 MED ORDER — LABETALOL HCL 5 MG/ML IV SOLN: 10.0000 mg | INTRAVENOUS | Status: DC | PRN

## 2021-04-02 MED ORDER — METHYLPREDNISOLONE SODIUM SUCC 125 MG IJ SOLR
125.0000 mg | Freq: Once | INTRAMUSCULAR | Status: AC
  Administered 2021-04-02: 125 mg via INTRAVENOUS
  Filled 2021-04-02: qty 2

## 2021-04-02 MED ORDER — LIDOCAINE HCL (PF) 1 % IJ SOLN
INTRAMUSCULAR | Status: DC | PRN
Start: 2021-04-02 — End: 2021-04-02
  Administered 2021-04-02: 4 mL

## 2021-04-02 MED ORDER — WARFARIN - PHARMACIST DOSING INPATIENT: Freq: Every day | Status: DC

## 2021-04-02 MED ORDER — SODIUM CHLORIDE 0.9 % IV SOLN: INTRAVENOUS | Status: DC

## 2021-04-02 MED ORDER — HEPARIN SODIUM (PORCINE) 1000 UNIT/ML IJ SOLN
INTRAMUSCULAR | Status: AC
  Filled 2021-04-02: qty 10

## 2021-04-02 SURGICAL SUPPLY — 12 items
CATH BALLN WEDGE 5F 110CM (CATHETERS) ×1 IMPLANT
CATH INFINITI 5 FR JL3.5 (CATHETERS) ×1 IMPLANT
CATH INFINITI JR4 5F (CATHETERS) ×1 IMPLANT
DEVICE RAD COMP TR BAND LRG (VASCULAR PRODUCTS) ×1 IMPLANT
GLIDESHEATH SLEND SS 6F .021 (SHEATH) ×1 IMPLANT
GUIDEWIRE .025 260CM (WIRE) ×1 IMPLANT
GUIDEWIRE INQWIRE 1.5J.035X260 (WIRE) IMPLANT
INQWIRE 1.5J .035X260CM (WIRE) ×2
KIT MICROPUNCTURE NIT STIFF (SHEATH) ×1 IMPLANT
PACK CARDIAC CATHETERIZATION (CUSTOM PROCEDURE TRAY) ×2 IMPLANT
SHEATH GLIDE SLENDER 4/5FR (SHEATH) ×1 IMPLANT
TRANSDUCER W/STOPCOCK (MISCELLANEOUS) ×2 IMPLANT

## 2021-04-02 NOTE — Progress Notes (Addendum)
? ?Progress Note ? ?[name] ?Date of Encounter: 04/02/2021 ? ?[facility] Cardiologist: Cristopher Peru, MD  ? ?Subjective  ? ?Patient had good sleep overnight, denies any chest pain or SOB. Is able to lay flat without dyspnea. Is NPO for cath this morning  ? ?Inpatient Medications  ?  ?Scheduled Meds: ? cephALEXin  500 mg Oral Q12H  ? Chlorhexidine Gluconate Cloth  6 each Topical Daily  ? dorzolamide-timolol  1 drop Both Eyes BID  ? feeding supplement  237 mL Oral BID BM  ? latanoprost  1 drop Both Eyes QHS  ? metoprolol succinate  50 mg Oral Daily  ? multivitamin with minerals  1 tablet Oral Daily  ? pantoprazole  40 mg Oral Daily  ? polyethylene glycol  17 g Oral Daily  ? sodium chloride flush  3 mL Intravenous Q12H  ? sodium chloride flush  3 mL Intravenous Q12H  ? tamsulosin  0.4 mg Oral Daily  ? ?Continuous Infusions: ? sodium chloride    ? sodium chloride    ? sodium chloride 10 mL/hr at 04/02/21 0600  ? amiodarone 30 mg/hr (04/02/21 0600)  ? heparin 1,100 Units/hr (04/02/21 0600)  ? ?PRN Meds: ?sodium chloride, sodium chloride, acetaminophen, albuterol, diclofenac Sodium, senna, sodium chloride flush, sodium chloride flush  ? ?Vital Signs  ?  ?Vitals:  ? 04/01/21 0532 04/01/21 1317 04/01/21 2022 04/02/21 0421  ?BP: (!) 153/91 140/85 122/68 (!) 142/96  ?Pulse: 92 98 93 93  ?Resp: 16 18 18 17   ?Temp: (!) 96.8 ?F (36 ?C) 97.7 ?F (36.5 ?C) 98 ?F (36.7 ?C) 98.1 ?F (36.7 ?C)  ?TempSrc: Axillary Oral Oral Oral  ?SpO2:   96% 100%  ?Weight: 75.8 kg   76.2 kg  ?Height:      ? ? ?Intake/Output Summary (Last 24 hours) at 04/02/2021 0640 ?Last data filed at 04/02/2021 0600 ?Gross per 24 hour  ?Intake 2225.97 ml  ?Output 600 ml  ?Net 1625.97 ml  ? ?Last 3 Weights 04/02/2021 04/01/2021 03/31/2021  ?Weight (lbs) 167 lb 15.9 oz 167 lb 1.7 oz 174 lb 2.6 oz  ?Weight (kg) 76.2 kg 75.8 kg 79 kg  ?   ? ?Telemetry  ?  ?Atrial fibrillation, HR in the 80s-100s - Personally Reviewed ? ?ECG  ?  ?Atrial Fibrillation, HR  100, RBBB - Personally Reviewed ? ?Physical Exam  ? ?GEN: No acute distress. Laying comfortably in the bed ?Neck: No JVD ?Cardiac: Irregular rate and rhythm, Grade 2/6 systolic murmur+mechanical click. Radial pulses 2+ bilaterally   ?Respiratory: Clear to auscultation bilaterally. No increased WOB ?GI: Soft, tender to deep palpation in LLQ ?MS: No edema; No deformity. ?Neuro:  Nonfocal  ?Psych: Normal affect  ? ?Labs  ?  ?High Sensitivity Troponin:   ?Recent Labs  ?Lab 03/20/21 ?1340 03/20/21 ?1647  ?TROPONINIHS 14 14  ?   ?Chemistry ?Recent Labs  ?Lab 03/31/21 ?0123 04/01/21 ?[id number] 04/02/21 ?[id number]  ?NA 138 140 139  ?K 3.7 3.7 3.8  ?CL 107 109 107  ?CO2 23 24 24   ?GLUCOSE 108* 110* 109*  ?BUN 62* 46* 33*  ?CREATININE 2.21* 1.89* 1.81*  ?CALCIUM 8.9 9.1 9.0  ?MG 1.9  --   --   ?GFRNONAA 22* 26* 27*  ?ANIONGAP 8 7 8   ?  ?Lipids No results for input(s): CHOL, TRIG, HDL, LABVLDL, LDLCALC, CHOLHDL in the last 168 hours.  ?Hematology ?Recent Labs  ?Lab 03/31/21 ?0123 04/01/21 ?[id number] 04/02/21 ?[id number]  ?WBC 6.0 6.8 7.3  ?RBC  3.67* 3.68* 3.73*  ?HGB 10.0* 10.2* 10.0*  ?HCT 32.5* 33.0* 33.0*  ?MCV 88.6 89.7 88.5  ?MCH 27.2 27.7 26.8  ?MCHC 30.8 30.9 30.3  ?RDW 15.5 15.6* 15.7*  ?PLT 225 227 221  ? ?Thyroid No results for input(s): TSH, FREET4 in the last 168 hours.  ?BNP ?Recent Labs  ?Lab 03/29/21 ?0214  ?BNP 513.6*  ?  ?DDimer No results for input(s): DDIMER in the last 168 hours.  ? ?Radiology  ?  ?No results found. ? ?Cardiac Studies  ? ?Echocardiogram 03/21/21 ? 1. Left ventricular ejection fraction, by estimation, is 35 to 40%. The  ?left ventricle has moderately decreased function. The left ventricle  ?demonstrates global hypokinesis. There is moderate left ventricular  ?hypertrophy. Left ventricular diastolic  ?function could not be evaluated.  ? 2. Right ventricular systolic function is mildly reduced. The right  ?ventricular size is normal.  ? 3. Left atrial size was Massively dilated at 126 ml/m2.  ? 4. Right atrial size  was severely dilated.  ? 5. The mitral valve has been repaired/replaced. Trivial mitral valve  ?regurgitation. No evidence of mitral stenosis. There is a St. Jude  ?mechanical valve present in the mitral position. Procedure Date: 30.  ?Echo findings are consistent with normal  ?structure and function of the mitral valve prosthesis.  ? 6. The aortic valve is calcified. Aortic valve regurgitation is not  ?visualized. Mild to moderate aortic valve stenosis. Aortic valve area, by  ?VTI measures 1.48 cm?Marland Kitchen Aortic valve mean gradient measures 8.0 mmHg.  ?Aortic valve Vmax measures 1.94 m/s.  ? ?Comparison(s): Changes from prior study are noted. 07/25/2020: LVEF 50-55%.  ? ?Patient Profile  ?   ?86 y.o. female with PMH of chronic diastolic CHF, chronic atrial fibrillation on coumadin, St Jude mechanical MVR 1987, mild AS, NSVT, RBBB, HTN, CKD stage III, GERD and chronic anemia who presented on 3/1 with worsening dyspnea for 3 months. BNP elevated at 1633 with course complicated by ARF out-of-proportion to diuresis with possible element of obstructive etiology given improvement with foley placement. Also with Afib with RVR. Cardiology is consulted for HFrEF and Afib ? ?Assessment & Plan  ?  ?Acute on chronic combined systolic and diastolic heart failure  ?- Echo this admission showed LVEF 35-40% (down from 50-55% in 07/2020), global hypokinesis, moderate LVH, mild RV systolic dysfunction, severe biatrial enlargement, mechanical MVR with normal function, mild-to-moderate AS. Patient initially started on Lasix, but it was discontinued due to worsening renal function with concern for possible obstruction given brisk urine output after foley placement. No hydronephrosis on ulstrasound, renal function initially improved after foley placement, but then bumped again slightly likely due to afib with soft BP. Now that afib is better controlled and BP improving, renal function is improving.  ?- Given patient's new drop in EF, difficult  diuresis due to renal function/possible bladder obstruction, patient would benefit from a RHC/LHC once renal function is more stable. Creatinine yesterday 1.89, postponed patient's cath to today to allow more time for renal function to improve.  ?- Creatinine 1.81 this AM (improved, but only slightly) Will confirm with MD if this is improved enough for cath. I am concerned that her renal function will not show significant improvement as her baseline creatinine appears to be around 1.5 (1.6 at admission). Will likely only do diagnostics today with possible staged PCI if needed ?- Tentatively planned for cath this AM. NPO since midnight. Patient does have a contrast allergy so ordered benadryl/solu-medrol   ?-  Patient not currently on diuretics, appears euvolemic on exam. Breathing has significantly improved since admission, able to tolerate laying flat and walking in the halls without SOB. OK to continue off diuretics  ?- Increase metoprolol to 100 mg daily  ?- Cannot add ACE/ARNI/ARB or spironolactone due to renal function. Would like to be able to add as renal function improves, but may not be able to as her baseline creatinine is 1.5. Could consider adding an SGLT2i if GFR stays above 20  ?- Continue to monitor I/Os and daily weights ?  ?AKI on CKD stage IIIa ?- Patient developed significant AKI, suspected to be related to bladder outlet obstruction as patient had good diuresis after foley placement. Renal ultrasound with no hydronephrosis. Renal function initially improved after foley placement, but then rose again likely due to afib with RVR with soft BP.  ?- Creatinine is downtrending, down to 1.81 this AM  ?- Avoid nephrotoxic medications ?  ?Saint Jude mechanical mitral valve placed in 1987: ?- Echo this admission with normal functioning mechanical valve. +mechanical click on physical exam  ?- Routine follow-up as outpatient ?- Warfarin held given plans for possible LHC/RHC ?- Continue heparin gtt for now ?   ?Persistent atrial fibrillation  ?- HR was controlled overnight. Yesterday, patient's HR increased with ambulating, will evaluate again today.  ?-Increase metoprolol to 100 mg daily as HR is still i

## 2021-04-02 NOTE — Progress Notes (Signed)
ANTICOAGULATION CONSULT NOTE - Follow Up Consult ? ?Pharmacy Consult for heparin>warfarin  ?Indication:  mechanical mitral valve ? ?Allergies  ?Allergen Reactions  ? Iodinated Contrast Media Rash  ?  Pt stated in the past had broken out in a rash on back and itching.   ? Esomeprazole Magnesium   ?  REACTION: stomach upset, nausea  ? ? ?Patient Measurements: ?Height: 5\' 8"  (172.7 cm) ?Weight: 76.2 kg (167 lb 15.9 oz) ?IBW/kg (Calculated) : 63.9 ?Heparin Dosing Weight: 73.1 kg ? ?Vital Signs: ?Temp: 98 ?F (36.7 ?C) (03/14 2031) ?Temp Source: Oral (03/14 2031) ?BP: 111/67 (03/14 2031) ?Pulse Rate: 87 (03/14 2031) ? ?Labs: ?Recent Labs  ?  03/31/21 ?0123 04/01/21 ?0156 04/02/21 ?0259 04/02/21 ?[id number] 04/02/21 ?[id number] 04/02/21 ?[id number] 04/02/21 ?2248  ?HGB 10.0* 10.2* 10.0*  --  8.5* 10.9*  --   ?HCT 32.5* 33.0* 33.0*  --  25.0* 32.0*  --   ?PLT 225 227 221  --   --   --   --   ?LABPROT  --   --   --  14.3  --   --   --   ?INR  --   --   --  1.1  --   --   --   ?HEPARINUNFRC 0.51 0.52 0.58  --   --   --  0.44  ?CREATININE 2.21* 1.89* 1.81*  --   --   --   --   ? ? ? ?Estimated Creatinine Clearance: 23.8 mL/min (A) (by C-G formula based on SCr of 1.81 mg/dL (H)). ? ? ?Assessment: ?86 yo F with a history of mechanical mitral valve replacement/PAF, on long-term anticoagulation with warfarin. PTA warfarin dosing: 5 mg Sun/Tues/Thurs, 10 mg all other days (last dose of warfarin 3/1). Pharmacy consulted for heparin dosing.  ? ?Heparin level therapeutic at 0.58 with heparin infusing at 1100 units/hour. Hgb stable 10s, platelets are within normal limits. No signs of bleeding or IV site issues noted. Heart cath performed 3/14 and heparin infusion appears to have been off during that time. Pharmacy consulted to restart heparin 4 hours post-sheath removal (3/14 1330). Per cardiology, warfarin may also be restarted post-cath. Patient will need bridging during re-initiation of warfarin. FMTS to evaluate possible lovenox bridge as  outpatient.  ? ?3/14 PM update:  ?Heparin level remains therapeutic  ? ?Goal of Therapy:  ?Heparin level 0.3-0.7 units/ml ?Monitor platelets by anticoagulation protocol: Yes ?  ?Plan:  ?Cont heparin at 1100 units/hr ?Daily CBC/heparin level ?Monitor for bleeding ? ?Narda Bonds, PharmD, BCPS ?Clinical Pharmacist ?Phone: [phone] ? ? ?

## 2021-04-02 NOTE — Progress Notes (Signed)
Patient requested that this writer take her purse that her daughter bought up here down to security. Went in to document contents of patient's purse and place in bag. At this time patient informed this Clinical research associate that she would just send purse back home with daughter. Patient's purse sent home with daughter Milon Dikes.  ?

## 2021-04-02 NOTE — Progress Notes (Signed)
FPTS Interim Night Progress Note ? ?S:Patient sleeping comfortably.  Rounded with primary night RN.  No concerns voiced.  No orders required.   ? ?O: ?Today's Vitals  ? 04/02/21 1055 04/02/21 1233 04/02/21 1403 04/02/21 2031  ?BP: (!) 146/99 118/87 124/76 111/67  ?Pulse: 97 91 86 87  ?Resp:  (!) 25 16 18   ?Temp:  97.7 ?F (36.5 ?C) 97.9 ?F (36.6 ?C) 98 ?F (36.7 ?C)  ?TempSrc:  Oral Axillary Oral  ?SpO2:  95% 96% 96%  ?Weight:      ?Height:      ?PainSc:    0-No pain  ? ? ? ? ?A/P: ?Continue current management ? ? MD ?PGY-3, [facility] ?Service pager [phone]   ?

## 2021-04-02 NOTE — Progress Notes (Signed)
Physical Therapy Treatment ?Patient Details ?Name: [name] ?MRN: [id number] ?DOB: 1936/01/19 ?Today's Date: 04/02/2021 ? ? ?History of Present Illness 86 yo admitted 3/1 with DOE and CP. Pt with acute hypoxic respiratory failure secondary to new HFrEF, with acute renal failure. Pt with UTI and urinary retention acutely. 3/14 s/p L/R Heart Cath.  PMHx:HFpEF, s/p MVR, permanent Afib, HTN, CKD stage III, osteoporosis, Rt hemicolectomy, cirrhosis, GERD ? ?  ?PT Comments  ? ? Pt received in supine, agreeable to therapy session and with good participation in supine/seated LE exercises. Pt given instruction on post-heart cath precautions and reports she was unaware of no UE bending precaution first 24 hours, RN notified she may need reinforcement. Pt performed supine to long sit with min guard. Pt defer OOB due to arrival of dinner and wanting to eat, agreeable to more OOB activity next session. HEP handout given to reinforce supine exercises TID. Pt continues to benefit from PT services to progress toward functional mobility goals.   ?Recommendations for follow up therapy are one component of a multi-disciplinary discharge planning process, led by the attending physician.  Recommendations may be updated based on patient status, additional functional criteria and insurance authorization. ? ?Follow Up Recommendations ? Home health PT ?  ?  ?Assistance Recommended at Discharge Intermittent Supervision/Assistance  ?Patient can return home with the following Assistance with cooking/housework;Assist for transportation;A little help with walking and/or transfers ?  ?Equipment Recommendations ? None recommended by PT  ?  ?Recommendations for Other Services   ? ? ?  ?Precautions / Restrictions Precautions ?Precautions: Fall ?Precaution Comments: post-radial L/R heart cath precs (no lifting); watch HR ?Restrictions ?Other Position/Activity Restrictions: no lifting >7 LB in either UE, first 24hr no UE bending  ?  ? ?Mobility ?  Bed Mobility ?Overal bed mobility: Needs Assistance ?  ?  ?  ?  ?  ?  ?General bed mobility comments: partially elevated HOB to long sit with min guard for repositoning, defer OOB due to pt dinner arriving after supine LE exercises and pt with BUE no bending precs. ?  ? ?Transfers ?  ?  ?  ?  ?  ?  ?  ?  ?  ?  ?  ? ?Ambulation/Gait ?  ?  ?  ?  ?  ?  ?  ?  ? ? ?Stairs ?  ?  ?  ?  ?  ? ? ?Wheelchair Mobility ?  ? ?Modified Rankin (Stroke Patients Only) ?  ? ? ?  ?Balance Overall balance assessment: Needs assistance ?Sitting-balance support: No upper extremity supported ?Sitting balance-Leahy Scale: Fair ?Sitting balance - Comments: long sit no LOB unsupported ?  ?  ?  ?  ?  ?  ?  ?  ?  ?  ?  ?  ?  ?  ?  ?  ? ?  ?Cognition Arousal/Alertness: Awake/alert ?Behavior During Therapy: [facility] for tasks assessed/performed ?Overall Cognitive Status: Within Functional Limits for tasks assessed ?  ?  ?  ?  ?  ?  ?  ?  ?  ?  ?  ?Memory: Decreased short-term memory ?  ?  ?  ?  ?General Comments: pt somewhat slow processing and reports she did not know she wasn't allowed to bend her arms (pt talking on phone for ~10 mins prior to session, RN notified). ?  ?  ? ?  ?Exercises General Exercises - Lower Extremity ?Long Arc Quad: AROM, Both, Seated, 10 reps ?Hip ABduction/ADduction: AAROM,  Both, Supine, 15 reps, AROM ?Hip Flexion/Marching: AROM, Both, Seated, 10 reps ?Toe Raises: AROM, AAROM, Both, 15 reps, Supine ?Heel Raises: AROM, Both, Seated, 15 reps ?Other Exercises ?Other Exercises: supine BLE AROM: glute sets, SAQ x10 reps ea ? ?  ?General Comments General comments (skin integrity, edema, etc.): pt will need reinforcement of no UE bending for first 24hr likely; BP 128/88 taken supine at beginning of session, HR 97 bpm SpO2 100% on RA ?  ?  ? ?Pertinent Vitals/Pain Pain Assessment ?Pain Assessment: No/denies pain ?Pain Intervention(s): Monitored during session, Repositioned  ? ? ?Home Living   ?  ?  ?  ?  ?  ?  ?  ?  ?  ?   ?  ?Prior  Function    ?  ?  ?   ? ?PT Goals (current goals can now be found in the care plan section) Acute Rehab PT Goals ?PT Goal Formulation: With patient ?Time For Goal Achievement: 04/09/21 ?Potential to Achieve Goals: Fair ?Progress towards PT goals: Progressing toward goals ? ?  ?Frequency ? ? ? Min 3X/week ? ? ? ?  ?PT Plan Current plan remains appropriate  ? ? ?Co-evaluation   ?  ?  ?  ?  ? ?  ?AM-PAC PT "6 Clicks" Mobility   ?Outcome Measure ? Help needed turning from your back to your side while in a flat bed without using bedrails?: None ?Help needed moving from lying on your back to sitting on the side of a flat bed without using bedrails?: A Little ?Help needed moving to and from a bed to a chair (including a wheelchair)?: A Little ?Help needed standing up from a chair using your arms (e.g., wheelchair or bedside chair)?: A Little ?Help needed to walk in hospital room?: A Little ?Help needed climbing 3-5 steps with a railing? : A Little ?6 Click Score: 19 ? ?  ?End of Session   ?Activity Tolerance: Patient tolerated treatment well ?Patient left: in bed;with call bell/phone within reach;Other (comment);with bed alarm set (bed in chair position, folded blanket under LUE to prevent her having to bend it as much with preparing to eat) ?Nurse Communication: Mobility status;Other (comment);Precautions (pt w/decreased recall of precautions first 24 hours, able to recall no lifting prec) ?PT Visit Diagnosis: Other abnormalities of gait and mobility (R26.89);Muscle weakness (generalized) (M62.81) ?  ? ? ?Time: [id number] ?PT Time Calculation (min) (ACUTE ONLY): 32 min ? ?Charges:  $Therapeutic Exercise: 8-22 mins ?$Therapeutic Activity: 8-22 mins          ?          ? ?Veer Elamin P., PTA ?Acute Rehabilitation Services ?Pager: [phone] ?Office: [phone]  ? ? ?Nysir Fergusson M Moriya Mitchell ?04/02/2021, 6:32 PM ? ?

## 2021-04-02 NOTE — Progress Notes (Signed)
? ?                                                                                                                                                     ?                                                   ?Daily Progress Note  ? ?[name]       Date: 04/02/2021 ?DOB: 12-23-1935  Age: 86 y.o. MRN#: [id number] ?Attending Physician: Kinnie Feil, MD ?Primary Care Physician: Dickie La, MD ?Admit Date: 03/20/2021 ? ?Reason for Consultation/Follow-up: Establishing goals of care and Psychosocial/spiritual support ? ?Subjective: ?Medical records reviewed. Patient assessed at the bedside. She is feeling tired after cath procedure. Her daughter [name] is present. ? ?Reviewed the role of palliative care as an extra layer of support for patients and families faced with serious illnesses. Emphasized the importance of ongoing conversations and anticipatory care planning, particularly with chronic progressive conditions such as dementia, heart failure, and kidney disease. Encouraged completion of HCPOA documentation when ready and able.  ? ?Questions and concerns addressed. Hard Choices for [employer] booklet provided for review. PMT will continue to support holistically. ? ?Length of Stay: 13 ? ?Current Medications: ?Scheduled Meds:  ? amiodarone  400 mg Oral BID  ? cephALEXin  500 mg Oral Q12H  ? Chlorhexidine Gluconate Cloth  6 each Topical Daily  ? dorzolamide-timolol  1 drop Both Eyes BID  ? feeding supplement  237 mL Oral BID BM  ? furosemide  40 mg Oral Daily  ? latanoprost  1 drop Both Eyes QHS  ? metoprolol succinate  100 mg Oral Daily  ? multivitamin with minerals  1 tablet Oral Daily  ? pantoprazole  40 mg Oral Daily  ? polyethylene glycol  17 g Oral Daily  ? sodium chloride flush  3 mL Intravenous Q12H  ? sodium chloride flush  3 mL Intravenous Q12H  ? tamsulosin  0.4 mg Oral Daily  ? warfarin  5 mg Oral ONCE-1600  ? Warfarin - Pharmacist Dosing Inpatient   Does not apply [id number]  ? ? ? ?PRN  Meds: ?sodium chloride, acetaminophen, acetaminophen, albuterol, diclofenac Sodium, hydrALAZINE, labetalol, ondansetron (ZOFRAN) IV, senna, sodium chloride flush ? ?Physical Exam ?Vitals and nursing note reviewed.  ?Constitutional:   ?   General: She is not in acute distress. ?   Comments: Elderly, frail appearing  ?Cardiovascular:  ?   Rate and Rhythm: Normal rate.  ?Pulmonary:  ?   Effort: Pulmonary effort is normal.  ?Skin: ?   General: Skin is warm and dry.  ?Neurological:  ?   Mental Status:  She is alert and oriented to person, place, and time.  ?Psychiatric:     ?   Mood and Affect: Mood normal.  ?         ? ?Vital Signs: BP 124/76 (BP Location: Left Arm)   Pulse 86   Temp 97.9 ?F (36.6 ?C) (Axillary)   Resp 16   Ht 5\' 8"  (1.727 m)   Wt 76.2 kg   SpO2 96%   BMI 25.54 kg/m?  ?SpO2: SpO2: 96 % ?O2 Device: O2 Device: Room Air ?O2 Flow Rate: O2 Flow Rate (L/min): 2 L/min ? ?Intake/output summary:  ?Intake/Output Summary (Last 24 hours) at 04/02/2021 1521 ?Last data filed at 04/02/2021 1245 ?Gross per 24 hour  ?Intake 2272.19 ml  ?Output 925 ml  ?Net 1347.19 ml  ? ? ?LBM: Last BM Date : 04/01/21 ?Baseline Weight: Weight: 79.7 kg ?Most recent weight: Weight: 76.2 kg ? ?     ?Palliative Assessment/Data: 60% ? ? ? ? ? ?Patient Active Problem List  ? Diagnosis Date Noted  ? Acute renal failure ([place])   ? Acute on chronic HFrEF (heart failure with reduced ejection fraction) ([place])   ? Delirium   ? Acute respiratory failure with hypoxia ([place])   ? Unstable angina ([place]) 07/25/2020  ? Anemia 04/25/2020  ? Anorectal disorder 04/25/2020  ? Bloating 04/25/2020  ? Diarrhea 04/25/2020  ? Diverticular disease of colon 04/25/2020  ? Gastro-esophageal reflux disease without esophagitis 04/25/2020  ? Acute congestive heart failure ([place]) 04/25/2020  ? Heartburn 04/25/2020  ? Hematochezia 04/25/2020  ? Neck pain 04/25/2020  ? Other specified symptoms and signs involving the digestive system and abdomen 04/25/2020  ? Overactive  bladder 04/25/2020  ? Personal history of colonic polyps 04/25/2020  ? Polyp of colon 04/25/2020  ? Spinal stenosis 04/25/2020  ? Weakness 04/25/2020  ? Leg pain 01/24/2020  ? Cirrhosis of liver ([place]) 06/22/2019  ? H/O right hemicolectomy 06/16/2018  ? Abnormal CT scan, liver 06/16/2018  ? Urge incontinence 01/08/2018  ? High risk social situation 06/27/2015  ? Chronic atrial fibrillation (HCC)   ? CKD (chronic kidney disease), stage III ([place]) 05/31/2015  ? Hx of Tibial plateau fracture 2017 05/22/2015  ? Goals of care, counseling/discussion 02/15/2013  ? Long term current use of anticoagulant therapy 05/03/2010  ? RBBB 02/16/2008  ? VENTRICULAR TACHYCARDIA 02/16/2008  ? History of mitral valve replacement - St Jude mechanical valve 02/16/2008  ? Hypertension 03/19/2006  ? Atrial fibrillation with RVR ([place]) 03/19/2006  ? RHINITIS, ALLERGIC 03/19/2006  ? Osteoarthrosis involving lower leg 03/19/2006  ? Osteoporosis 03/19/2006  ? ? ?Palliative Care Assessment & Plan  ? ?Patient Profile: ?86 y.o. female  with past medical history of  HFpEF, s/p mechanical mitral valve on coumadin, permanent a fib, HTN, CKD stage III, osteoporosis, history of right hemicolectomy, cirrhosis, GERDadmitted on 03/20/2021 with dyspnea on exertion and chest pain.  ?  ?Patient is in acute hypoxic respiratory failure secondary to new HFrEF, with acute renal failure on CKD stage 3a. Possible component of bladder outlet obstruction, as foley placed 3/4 with UOP of 1.7 to 5L. Patient's Cr and respiratory status are improving. PMT has been consulted to assist with goals of care conversation. ? ?Assessment: ?Goals of care conversation ?Acute on chronic combined HF ?AKI on CKD3 ? ?Recommendations/Plan: ?Continue current care ?Patient has outpatient palliative care referral when stable for discharge home ?PMT will continue to follow peripherally, please call team line or secure chat with urgent needs ? ?Prognosis: ?  Unable to determine ? ?Discharge  Planning: ?Home with Palliative Services ? ? ?Total time: ?I spent 25 minutes in the care of the patient today in the above activities and documenting the encounter. ? ? ?Dorthy Cooler, PA-C ?Palliative Medicine Team ?Team phone # [phone] ? ?Thank you for allowing the Palliative Medicine Team to assist in the care of this patient. Please utilize secure chat with additional questions, if there is no response within 30 minutes please call the above phone number. ? ?Palliative Medicine Team providers are available by phone from 7am to 7pm daily and can be reached through the team cell phone.  ?Should this patient require assistance outside of these hours, please call the patient's attending physician.  ?

## 2021-04-02 NOTE — Interval H&P Note (Signed)
History and Physical Interval Note: ? ?04/02/2021 ?8:40 AM ? ?[name]  has presented today for surgery, with the diagnosis of heart failure.  The various methods of treatment have been discussed with the patient and family. After consideration of risks, benefits and other options for treatment, the patient has consented to  Procedure(s): ?RIGHT/LEFT HEART CATH AND CORONARY ANGIOGRAPHY (N/A) and possible coronary angioplasty as a surgical intervention.  The patient's history has been reviewed, patient examined, no change in status, stable for surgery.  I have reviewed the patient's chart and labs.  Questions were answered to the patient's satisfaction.   ? ? ?[name] ? ? ?

## 2021-04-02 NOTE — Progress Notes (Signed)
FPTS Interim Night Progress Note ? ?S:Patient sleeping comfortably.  Rounded with primary night RN.  No concerns voiced.  No orders required.   ? ?O: ?Today's Vitals  ? 04/01/21 0745 04/01/21 1317 04/01/21 2022 04/02/21 0421  ?BP:  140/85 122/68 (!) 142/96  ?Pulse:  98 93 93  ?Resp:  18 18 17   ?Temp:  97.7 ?F (36.5 ?C) 98 ?F (36.7 ?C) 98.1 ?F (36.7 ?C)  ?TempSrc:  Oral Oral Oral  ?SpO2:   96% 100%  ?Weight:    76.2 kg  ?Height:      ?PainSc: 0-No pain   0-No pain  ? ? ? ? ?A/P: ?Continue current management ? ? MD ?PGY-3, [facility] ?Service pager [phone]   ?

## 2021-04-02 NOTE — Progress Notes (Signed)
ANTICOAGULATION CONSULT NOTE - Follow Up Consult ? ?Pharmacy Consult for heparin>warfarin  ?Indication:  mechanical mitral valve ? ?Allergies  ?Allergen Reactions  ? Iodinated Contrast Media Rash  ?  Pt stated in the past had broken out in a rash on back and itching.   ? Esomeprazole Magnesium   ?  REACTION: stomach upset, nausea  ? ? ?Patient Measurements: ?Height: 5\' 8"  (172.7 cm) ?Weight: 76.2 kg (167 lb 15.9 oz) ?IBW/kg (Calculated) : 63.9 ?Heparin Dosing Weight: 73.1 kg ? ?Vital Signs: ?Temp: 98.1 ?F (36.7 ?C) (03/14 0421) ?Temp Source: Oral (03/14 0421) ?BP: 142/96 (03/14 0421) ?Pulse Rate: 93 (03/14 0421) ? ?Labs: ?Recent Labs  ?  03/31/21 ?0123 04/01/21 ?0156 04/02/21 ?0259  ?HGB 10.0* 10.2* 10.0*  ?HCT 32.5* 33.0* 33.0*  ?PLT 225 227 221  ?HEPARINUNFRC 0.51 0.52 0.58  ?CREATININE 2.21* 1.89* 1.81*  ? ? ? ?Estimated Creatinine Clearance: 23.8 mL/min (A) (by C-G formula based on SCr of 1.81 mg/dL (H)). ? ? ?Assessment: ?86 yo F with a history of mechanical mitral valve replacement/PAF, on long-term anticoagulation with warfarin. PTA warfarin dosing: 5 mg Sun/Tues/Thurs, 10 mg all other days (last dose of warfarin 3/1). Pharmacy consulted for heparin dosing.  ? ?Heparin level therapeutic at 0.58 with heparin infusing at 1100 units/hour. Hgb stable 10s, platelets are within normal limits. No signs of bleeding or IV site issues noted. Heart cath performed 3/14 and heparin infusion appears to have been off during that time. Pharmacy consulted to restart heparin 4 hours post-sheath removal (3/14 1330). Per cardiology, warfarin may also be restarted post-cath. Patient will need bridging during re-initiation of warfarin. FMTS to evaluate possible lovenox bridge as outpatient.  ? ?Of note, patient has been started on amiodarone for rhythm control while inpatient. She should have close outpatient follow-up with [facility] to ensure appropriate warfarin dosing as amiodarone can increase INR and patient has been  stable on her home regimen for many years.  ? ?Goal of Therapy:  ?Heparin level 0.3-0.7 units/ml ?Monitor platelets by anticoagulation protocol: Yes ?  ?Plan:  ?Continue heparin infusion at 1100 units/hr (restart 3/14 1330) ?Warfarin 5 mg PO x1   ?Daily heparin level, CBC  ?Continue to monitor H&H and platelets ?F/U plans for bridging  ? ?Adria Dill, PharmD ?PGY-1 Acute Care Resident  ?04/02/2021 6:12 AM  ? ? ?

## 2021-04-02 NOTE — Care Management Important Message (Signed)
Important Message ? ?Patient Details  ?Name: [name] ?MRN: [id number] ?Date of Birth: Apr 03, 1935 ? ? ?Medicare Important Message Given:  Yes ? ? ? ? ?[name] ?04/02/2021, 9:47 AM ?

## 2021-04-02 NOTE — Progress Notes (Signed)
Family Medicine Teaching Service ?Daily Progress Note ?Intern Pager: [phone] ? ?[name] Medical record number: [id number] ?Date of birth: [birth year] Age: 86 y.o. Gender: female ? ?Primary Care Provider: Dickie La, MD ?Consultants: Cardiology, Palliative, Urology ?Code Status: Full ? ?Pt Overview and Major Events to Date:  ?3/1: Admitted ?3/14: Left/right heart cath ? ?Assessment and Plan: ?Patient is an 86 year old female who presented with dyspnea on exertion and chest pain and has been awaiting heart cath which has been delayed due to AKI.  PMH significant for HFpEF (now HFrEF), s/p prosthetic mitral valve on Coumadin, permanent A-fib, HTN, CKD stage III, osteoporosis, history of right hemicolectomy, GERD. ? ?AKI on CKD 3: Improved  urinary retention ?Creatinine improved from 1.89 to 1.81.  UOP 950 mL last 24 hours.  Today is the last day for Keflex for UTI. ?-Keflex (day 5/5) ?-Continue Flomax 0.4 mg daily ?-[facility] for repeat void trial and urodynamics ?-Daily BMP to monitor creatinine ? ?HFrEF: Stable ?Stable vitals.  Awaiting heart cath.  UOP 950 mL in the last 24 hours. ?-Cardiology following, appreciate recommendations ?-Heart cath today ?-Toprol-XL 100 mg daily ? ?Persistent A-fib: Stable s/p mechanical mitral valve ?Rate controlled in the 80s to 90s. She will need to be switched back to coumadin and continue Lovenox until being  ?-Cardiology following, appreciate recommendations ?-Heart cath today ?-Switch from amio gtt to oral, per cardiology ?-Toprol-XL 100 mg daily ?-Transition from heparin gtt to Warfarin after heart cath ? ?FEN/GI: N.p.o. for pending heart cath ?PPx: Heparin drip ?Dispo:Home pending clinical improvement . Barriers include heart cath.  ? ?Subjective:  ?Patient states she is doing well today and is nervous but looking forward to hopefully get any cath procedure done.  No questions or concerns at this time. ? ?Objective: ?Temp:   [97.7 ?F (36.5 ?C)-98.1 ?F (36.7 ?C)] 98.1 ?F (36.7 ?C) (03/14 0421) ?Pulse Rate:  [93-98] 93 (03/14 0421) ?Resp:  [17-18] 17 (03/14 0421) ?BP: (122-142)/(68-96) 142/96 (03/14 0421) ?SpO2:  [96 %-100 %] 100 % (03/14 0421) ?Weight:  [76.2 kg] 76.2 kg (03/14 0421) ?Physical Exam: ?General: Awake, alert, NAD ?Cardiovascular: Irregularly irregular, systolic murmur ?Respiratory: CTA B, no wheezing or crackles ?Extremities: No edema appreciated ? ?Laboratory: ?Recent Labs  ?Lab 03/31/21 ?0123 04/01/21 ?[id number] 04/02/21 ?[id number]  ?WBC 6.0 6.8 7.3  ?HGB 10.0* 10.2* 10.0*  ?HCT 32.5* 33.0* 33.0*  ?PLT 225 227 221  ? ?Recent Labs  ?Lab 03/31/21 ?0123 04/01/21 ?[id number] 04/02/21 ?[id number]  ?NA 138 140 139  ?K 3.7 3.7 3.8  ?CL 107 109 107  ?CO2 23 24 24   ?BUN 62* 46* 33*  ?CREATININE 2.21* 1.89* 1.81*  ?CALCIUM 8.9 9.1 9.0  ?GLUCOSE 108* 110* 109*  ? ? ?Imaging/Diagnostic Tests: ?No results found. ? ?Wells Guiles, DO ?04/02/2021, 6:53 AM ?PGY-1, [place] ?[place] Intern pager: [phone], text pages welcome ? ?

## 2021-04-03 ENCOUNTER — Other Ambulatory Visit (HOSPITAL_COMMUNITY): Payer: Self-pay

## 2021-04-03 DIAGNOSIS — R339 Retention of urine, unspecified: Secondary | ICD-10-CM

## 2021-04-03 LAB — BASIC METABOLIC PANEL
Anion gap: 13 (ref 5–15)
BUN: 38 mg/dL — ABNORMAL HIGH (ref 8–23)
CO2: 19 mmol/L — ABNORMAL LOW (ref 22–32)
Calcium: 8.8 mg/dL — ABNORMAL LOW (ref 8.9–10.3)
Chloride: 106 mmol/L (ref 98–111)
Creatinine, Ser: 1.89 mg/dL — ABNORMAL HIGH (ref 0.44–1.00)
GFR, Estimated: 26 mL/min — ABNORMAL LOW (ref >60)
Glucose, Bld: 108 mg/dL — ABNORMAL HIGH (ref 70–99)
Potassium: 3.9 mmol/L (ref 3.5–5.1)
Sodium: 138 mmol/L (ref 135–145)

## 2021-04-03 LAB — PROTIME-INR
INR: 1.2 (ref 0.8–1.2)
Prothrombin Time: 14.9 seconds (ref 11.4–15.2)

## 2021-04-03 LAB — CBC
HCT: 35.3 % — ABNORMAL LOW (ref 36.0–46.0)
Hemoglobin: 11 g/dL — ABNORMAL LOW (ref 12.0–15.0)
MCH: 27.6 pg (ref 26.0–34.0)
MCHC: 31.2 g/dL (ref 30.0–36.0)
MCV: 88.7 fL (ref 80.0–100.0)
Platelets: 235 10*3/uL (ref 150–400)
RBC: 3.98 MIL/uL (ref 3.87–5.11)
RDW: 15.3 % (ref 11.5–15.5)
WBC: 12.7 10*3/uL — ABNORMAL HIGH (ref 4.0–10.5)
nRBC: 0 % (ref 0.0–0.2)

## 2021-04-03 LAB — HEPARIN LEVEL (UNFRACTIONATED): Heparin Unfractionated: 0.55 IU/mL (ref 0.30–0.70)

## 2021-04-03 MED ORDER — METOPROLOL SUCCINATE ER 100 MG PO TB24
100.0000 mg | ORAL_TABLET | Freq: Every day | ORAL | 0 refills | Status: DC
  Filled 2021-04-03: qty 30, 30d supply, fill #0

## 2021-04-03 MED ORDER — ISOSORBIDE MONONITRATE ER 30 MG PO TB24
15.0000 mg | ORAL_TABLET | Freq: Every day | ORAL | 0 refills | Status: DC
Start: 2021-04-04 — End: 2021-04-17
  Filled 2021-04-03: qty 15, 30d supply, fill #0

## 2021-04-03 MED ORDER — ENOXAPARIN (LOVENOX) PATIENT EDUCATION KIT
PACK | Freq: Once | Status: AC
  Filled 2021-04-03: qty 1

## 2021-04-03 MED ORDER — FUROSEMIDE 40 MG PO TABS
40.0000 mg | ORAL_TABLET | Freq: Every day | ORAL | 0 refills | Status: DC
  Filled 2021-04-03: qty 30, 30d supply, fill #0

## 2021-04-03 MED ORDER — ISOSORBIDE MONONITRATE ER 30 MG PO TB24
15.0000 mg | ORAL_TABLET | Freq: Every day | ORAL | Status: DC
  Administered 2021-04-03: 15 mg via ORAL
  Filled 2021-04-03: qty 1

## 2021-04-03 MED ORDER — HYDRALAZINE HCL 25 MG PO TABS
12.5000 mg | ORAL_TABLET | Freq: Three times a day (TID) | ORAL | Status: DC
Start: 2021-04-03 — End: 2021-04-04
  Administered 2021-04-03: 12.5 mg via ORAL
  Filled 2021-04-03: qty 1

## 2021-04-03 MED ORDER — AMIODARONE HCL 200 MG PO TABS
ORAL_TABLET | ORAL | 0 refills | Status: DC
  Filled 2021-04-03: qty 72, 44d supply, fill #0

## 2021-04-03 MED ORDER — ENOXAPARIN SODIUM 80 MG/0.8ML IJ SOSY
80.0000 mg | PREFILLED_SYRINGE | Freq: Every day | INTRAMUSCULAR | 0 refills | Status: DC
  Filled 2021-04-03: qty 8, 10d supply, fill #0

## 2021-04-03 MED ORDER — ENOXAPARIN SODIUM 80 MG/0.8ML IJ SOSY
80.0000 mg | PREFILLED_SYRINGE | Freq: Two times a day (BID) | INTRAMUSCULAR | 0 refills | Status: DC
  Filled 2021-04-03: qty 8, 5d supply, fill #0

## 2021-04-03 MED ORDER — TAMSULOSIN HCL 0.4 MG PO CAPS
0.4000 mg | ORAL_CAPSULE | Freq: Every day | ORAL | 0 refills | Status: DC
Start: 2021-04-04 — End: 2021-04-17
  Filled 2021-04-03: qty 30, 30d supply, fill #0

## 2021-04-03 MED ORDER — ENOXAPARIN SODIUM 80 MG/0.8ML IJ SOSY
80.0000 mg | PREFILLED_SYRINGE | INTRAMUSCULAR | Status: DC
  Administered 2021-04-03: 80 mg via SUBCUTANEOUS
  Filled 2021-04-03: qty 0.8

## 2021-04-03 MED ORDER — HYDRALAZINE HCL 25 MG PO TABS
12.5000 mg | ORAL_TABLET | Freq: Three times a day (TID) | ORAL | 0 refills | Status: DC
  Filled 2021-04-03: qty 45, 30d supply, fill #0

## 2021-04-03 MED ORDER — WARFARIN SODIUM 5 MG PO TABS
10.0000 mg | ORAL_TABLET | Freq: Once | ORAL | Status: AC
  Administered 2021-04-03: 10 mg via ORAL
  Filled 2021-04-03: qty 2

## 2021-04-03 NOTE — Discharge Instructions (Addendum)
Dear [name],  ? ?Thank you for letting us participate in your care! In this section, you will find a brief hospital admission summary of why you were admitted to the hospital, what happened during your admission, your diagnosis/diagnoses, and recommended follow up.  ?You were admitted because you were experiencing shortness of breath on exertion and chest pain.  This was likely due to your congestive heart failure however your heart cath was reassuring.  You are also experiencing urinary retention and needs to be catheterized after receiving antibiotics for UTI.  [facility] for repeat void trials for the Foley catheter.  Please follow-up with cardiology.  Additionally please follow-up with failure medicine who will further discuss your transition back to Coumadin. ? ?DOCTOR'S APPOINTMENTS & FOLLOW UP ?Future Appointments  ?Date Time Provider Department Center  ?04/08/2021  9:45 AM [facility]  ?04/08/2021 10:50 AM ACCESS TO CARE POOL [facility]  ?04/19/2021  8:00 AM [facility] [place] [facility]  ?05/14/2021 12:45 PM Dyann Kief, PA-C [facility]  ? ? ? ?Thank you for choosing [facility]! Take care and be well! ? ?Family Medicine Teaching Service Inpatient Team ?[facility]  ?[facility]  ?[address] [place], [place] [address] ?[phone] ? ?

## 2021-04-03 NOTE — Progress Notes (Signed)
Mobility Specialist Progress Note  ? ? 04/03/21 1353  ?Mobility  ?Activity Ambulated with assistance in hallway  ?Level of Assistance Minimal assist, patient does 75% or more  ?Assistive Device Front wheel walker  ?Distance Ambulated (ft) 360 ft  ?Activity Response Tolerated fair  ?$Mobility charge 1 Mobility  ? ?Pt received in bed and agreeable. C/o stomach cramping from not having a BM. Took x1 longer standing rest break and halfway mark. On RA. Returned to sitting EOB with OT present.  ? ?Hildred Alamin ?Mobility Specialist  ?M.S. 5N: [phone]  ?

## 2021-04-03 NOTE — Progress Notes (Signed)
Occupational Therapy Treatment ?Patient Details ?Name: [name] ?MRN: [id number] ?DOB: 04-09-1935 ?Today's Date: 04/03/2021 ? ? ?History of present illness 86 yo admitted 3/1 with DOE and CP. Pt with acute hypoxic respiratory failure secondary to new HFrEF, with acute renal failure. Pt with UTI and urinary retention acutely. 3/14 s/p L/R Heart Cath.  PMHx:HFpEF, s/p MVR, permanent Afib, HTN, CKD stage III, osteoporosis, Rt hemicolectomy, cirrhosis, GERD ?  ?OT comments ? Pt progressing for goals this session. Post radial heart cath, reviewed education with pt regarding precautions of no lifting/pushing/pulling with BUE x5 days. Pt verbalized understanding. Handout with precautions already in room. Pt in hallway with MT upon arrival, able to complete standing grooming task, increased cuing for putting no weight through BUE during task and when using RW during mobility. VSS, slightly tachycardic (HR in 110's) during standing grooming task after walking in hallway. Pt presenting with impairments listed below, will follow acutely. Continue to recommend HHOT at d/c.  ? ?Recommendations for follow up therapy are one component of a multi-disciplinary discharge planning process, led by the attending physician.  Recommendations may be updated based on patient status, additional functional criteria and insurance authorization. ?   ?Follow Up Recommendations ? Home health OT  ?  ?Assistance Recommended at Discharge Intermittent Supervision/Assistance  ?Patient can return home with the following ? A little help with walking and/or transfers;A little help with bathing/dressing/bathroom;Assistance with cooking/housework;Assist for transportation;Help with stairs or ramp for entrance ?  ?Equipment Recommendations ? None recommended by OT  ?  ?Recommendations for Other Services   ? ?  ?Precautions / Restrictions Precautions ?Precautions: Fall ?Precaution Comments: post-radial L/R heart cath (3/14) precs (no lifting); no  pushing/pulling x 5 days; limited use of UE's; watch HR ?Restrictions ?Weight Bearing Restrictions: No ?Other Position/Activity Restrictions: no lifting >7 LB in either UE, first 24hr no UE bending  ? ? ?  ? ?Mobility Bed Mobility ?  ?  ?  ?  ?  ?  ?  ?General bed mobility comments: pt up with MT upon arrival ?  ? ?Transfers ?Overall transfer level: Needs assistance ?Equipment used: Rolling walker (2 wheels) ?Transfers: Sit to/from Stand ?Sit to Stand: Min guard ?  ?  ?  ?  ?  ?General transfer comment: cues for hand placement and no weight on wrists/RW ?  ?  ?Balance Overall balance assessment: Needs assistance ?Sitting-balance support: No upper extremity supported ?Sitting balance-Leahy Scale: Good ?  ?  ?Standing balance support: Bilateral upper extremity supported ?Standing balance-Leahy Scale: Fair ?  ?  ?  ?  ?  ?  ?  ?  ?  ?  ?  ?  ?   ? ?ADL either performed or assessed with clinical judgement  ? ?ADL   ?  ?  ?Grooming: Oral care;Standing;Min guard ?Grooming Details (indicate cue type and reason): completed standing at sink ?  ?  ?  ?  ?  ?  ?  ?  ?Toilet Transfer: Min guard;Ambulation;BSC/3in1;Rolling walker (2 wheels) ?Toilet Transfer Details (indicate cue type and reason): cues for resting hands on RW, not pushing through UEs ?  ?  ?  ?  ?Functional mobility during ADLs: Min guard;Rolling walker (2 wheels) ?  ?  ? ?Extremity/Trunk Assessment Upper Extremity Assessment ?Upper Extremity Assessment: Overall WFL for tasks assessed [facility] given UE precautions) ?  ?Lower Extremity Assessment ?Lower Extremity Assessment: Defer to PT evaluation ?  ?  ?  ? ?Vision   ?Vision Assessment?: No apparent  visual deficits ?  ?Perception Perception ?Perception: Not tested ?  ?Praxis Praxis ?Praxis: Not tested ?  ? ?Cognition Arousal/Alertness: Awake/alert ?Behavior During Therapy: [facility] for tasks assessed/performed ?Overall Cognitive Status: Within Functional Limits for tasks assessed ?Area of Impairment: Memory ?  ?  ?  ?  ?   ?  ?  ?  ?  ?  ?Memory: Decreased short-term memory ?  ?  ?  ?  ?General Comments: pt somewhat slow processing and reports she did not know she wasn't allowed to bend her arms (pt talking on phone for ~10 mins prior to session, RN notified). ?  ?  ?   ?Exercises   ? ?  ?Shoulder Instructions   ? ? ?  ?General Comments HR 110 during standing ADL after walking hallway with MT, educating pt on precautions including no lifting 5+ lbs, no pushing/pulling x5 days, pt unaware needs reinforcement  ? ? ?Pertinent Vitals/ Pain       Pain Assessment ?Pain Assessment: No/denies pain ? ?Home Living   ?  ?  ?  ?  ?  ?  ?  ?  ?  ?  ?  ?  ?  ?  ?  ?  ?  ?  ? ?  ?Prior Functioning/Environment    ?  ?  ?  ?   ? ?Frequency ? Min 2X/week  ? ? ? ? ?  ?Progress Toward Goals ? ?OT Goals(current goals can now be found in the care plan section) ? Progress towards OT goals: Progressing toward goals ? ?Acute Rehab OT Goals ?OT Goal Formulation: With patient ?Time For Goal Achievement: 04/10/21 ?Potential to Achieve Goals: Good ?ADL Goals ?Pt Will Perform Grooming: with modified independence;standing ?Pt Will Perform Lower Body Bathing: with modified independence;sit to/from stand ?Pt Will Perform Lower Body Dressing: with modified independence;sit to/from stand ?Pt Will Transfer to Toilet: with modified independence;ambulating ?Pt Will Perform Toileting - Clothing Manipulation and hygiene: with modified independence;sit to/from stand ?Additional ADL Goal #1: Pt will generalize energy conservation strategies during ADLs and mobility.  ?Plan Discharge plan remains appropriate   ? ?Co-evaluation ? ? ?   ?  ?  ?  ?  ? ?  ?AM-PAC OT "6 Clicks" Daily Activity     ?Outcome Measure ? ? Help from another person eating meals?: None ?Help from another person taking care of personal grooming?: A Little ?Help from another person toileting, which includes using toliet, bedpan, or urinal?: A Little ?Help from another person bathing (including washing,  rinsing, drying)?: A Little ?Help from another person to put on and taking off regular upper body clothing?: None ?Help from another person to put on and taking off regular lower body clothing?: A Little ?6 Click Score: 20 ? ?  ?End of Session Equipment Utilized During Treatment: Rolling walker (2 wheels) ? ?OT Visit Diagnosis: Unsteadiness on feet (R26.81);Other abnormalities of gait and mobility (R26.89);Muscle weakness (generalized) (M62.81);Other (comment) ?  ?Activity Tolerance Patient tolerated treatment well ?  ?Patient Left with chair alarm set;in chair;with call bell/phone within reach;with nursing/sitter in room ?  ?Nurse Communication Mobility status;Other (comment);Precautions (precautions) ?  ? ?   ? ?Time: [id number] ?OT Time Calculation (min): 28 min ? ?Charges: OT General Charges ?$OT Visit: 1 Visit ?OT Treatments ?$Self Care/Home Management : 8-22 mins ?$Therapeutic Activity: 8-22 mins ? ?Lynnda Child, OTD, OTR/L ?Acute Rehab ?[phone] ? ? ?Kaylyn Lim ?04/03/2021, 2:46 PM ?

## 2021-04-03 NOTE — TOC Progression Note (Signed)
Transition of Care (TOC) - Progression Note  ? ? ?Patient Details  ?Name: [name] ?MRN: [id number] ?Date of Birth: 23-Dec-1935 ? ?Transition of Care (TOC) CM/SW Contact  ?Kermit Balo, RN ?Phone Number: ?04/03/2021, 2:59 PM ? ?Clinical Narrative:    ?Patient didn't qualify for home oxygen with her ambulatory sats. MD updated.  ? ? ?Expected Discharge Plan: Home w Home Health Services ?Barriers to Discharge: Continued Medical Work up ? ?Expected Discharge Plan and Services ?Expected Discharge Plan: Home w Home Health Services ?  ?Discharge Planning Services: CM Consult ?Post Acute Care Choice: Home Health ?Living arrangements for the past 2 months: Apartment ?                ?DME Arranged: N/A ?DME Agency: NA ?  ?  ?  ?HH Arranged: PT, OT ?HH Agency: [facility] ?Date HH Agency Contacted: 03/27/21 ?Time HH Agency Contacted: 1229 ?Representative spoke with at [facility] Agency: Kandee Keen ? ? ?Social Determinants of Health (SDOH) Interventions ?  ? ?Readmission Risk Interventions ?No flowsheet data found. ? ?

## 2021-04-03 NOTE — Discharge Summary (Signed)
Family Medicine Teaching Service ?Hospital Discharge Summary ? ?[name] Medical record number: [id number] ?Date of birth: 1935-05-25 Age: 86 y.o. Gender: female ?Date of Admission: 03/20/2021  Date of Discharge: 04/03/2021 ?Admitting Physician: Zenia Resides, MD ? ?Primary Care Provider: Dickie La, MD ?Consultants: Cardiology, palliative, urology ? ?Indication for Hospitalization: Dyspnea on exertion chest pain ? ?Discharge Diagnoses/Problem List:  ?Principal Problem: ?  Acute congestive heart failure ([place]) ?Active Problems: ?  Atrial fibrillation with RVR ([place]) ?  Goals of care, counseling/discussion ?  CKD (chronic kidney disease), stage III ([place]) ?  Chronic atrial fibrillation (H[facility]) ?  Anemia ?  Acute respiratory failure with hypoxia ([place]) ?  Acute on chronic HFrEF (heart failure with reduced ejection fraction) ([place]) ?  Delirium ?  Acute renal failure ([place]) ?  ? ?Disposition: Home ? ?Discharge Condition: Stable ? ?Discharge Exam:  ?Blood pressure 138/85, pulse 98, temperature 98.4 ?F (36.9 ?C), temperature source Oral, resp. rate 17, height 5\' 8"  (1.727 m), weight 74.7 kg, SpO2 100 %.  ?General: Awake, alert, NAD ?Cardiovascular: Irregularly irregular, normal rate, systolic murmur ?Respiratory: CTAB, no increased work of breathing ?Extremities: No edema appreciated ? ?Brief Hospital Course:  ?Patient is an 86 year old female presenting with dyspnea on exertion and chest pain.  PMH significant for HFpEF, s/p mechanical mitral valve on Coumadin, permanent A-fib, HTN, CKD stage III, osteoporosis, history of right hemicolectomy, cirrhosis, GERD ? ?DOE and CP  acute on chronic CHF in setting of HFpEF  acute hypoxic respiratory failure ?Echocardiogram on 03/21/21 showed LVEF 35-40%, global hypokinesis, moderate LVH, mild RV systolic dysfunction, severe biatrial enlargement, mechanical MVR with normal function, mild to moderate AS.Cardiology consulted and recommended diuresis and RHC and LHC d/t  drastic decrease in EF. However pt then developed ARF and urinary retention. After foley placed, pt self diuresed with improvement in respiratory status and breathing comfortably on RA. RHC and LHC completed on 3/14 which showed normal coronary arteries, well compensated left-sided filling pressures, mild-mod reduced cardiac output. ? ?Urinary retention  ARF  UTI ?On 3/4, patient had Cr elevation to 5.37 with GFR 7 with likely bladder outlet obstruction. She had Foley placed with urinary output of 1.85 L. Pt was started on Flomax 0.4 mg daily.  Renal ultrasound s/p Foley catheter showed no hydronephrosis. Cr and GFR improved. Pt found to have UTI with dysuria, urine cx grew Proteus mirabilis and E. coli. She was treated with Keflex 500 mg BID for total of 5 days. After unsuccessful void trials, foley was replaced and urology consulted who recommended out patient follow up with [facility] for repeat void trials and urodynamics. ? ?S/P mechanical mitral valve  A-Fib ?Pt was initially continued home medications of cardizem and metoprolol. Home warfarin was d/c and pt was placed on heparin gtt with plans for procedure. Due to persistent amiodarone gtt and metoprolol XL were initiated with successful rate control. Pt was discharged on while being bridged and was scheduled in Coumadin clinic on 3/20 and received education on Lovenox injections prior to leaving hospital to continue taking with warfarin for bridging. ?  ?HTN ?Pt was continued on home medications of metoprolol 100mg  BID, cardizem 40 mg daily and started on hydralazine 25 mg TID. Medications were altered prn d/t soft pressures in setting of a fib w/ RVR on amiodarone. She was discharged on hydralazine 12.5 mg 3 times daily, Toprol-XL 100 mg daily, amiodarone taper, Imdur 15 mg daily.  ? ?PCP Follow-up items:  ?Follow-up outpatient with  [facility] for repeat void trial and urodynamics. ?Follow-up BMet for potassium and consider restarting  potassium supplementation ? ?Significant Procedures: RHC/LHC ? ?Significant Labs and Imaging:  ?Recent Labs  ?Lab 04/01/21 ?0156 04/02/21 ?0259 04/02/21 ?[id number] 04/02/21 ?[id number] 04/03/21 ?[id number]  ?WBC 6.8 7.3  --   --  12.7*  ?HGB 10.2* 10.0* 8.5* 10.9* 11.0*  ?HCT 33.0* 33.0* 25.0* 32.0* 35.3*  ?PLT 227 221  --   --  235  ? ?Recent Labs  ?Lab 03/30/21 ?0214 03/31/21 ?0123 04/01/21 ?[id number] 04/02/21 ?0259 04/02/21 ?[id number] 04/02/21 ?[id number] 04/03/21 ?[id number]  ?NA 138 138 140 139 148* 142 138  ?K 3.7 3.7 3.7 3.8 2.5* 3.7 3.9  ?CL 106 107 109 107  --   --  106  ?CO2 22 23 24 24   --   --  19*  ?GLUCOSE 100* 108* 110* 109*  --   --  108*  ?BUN 68* 62* 46* 33*  --   --  38*  ?CREATININE 2.76* 2.21* 1.89* 1.81*  --   --  1.89*  ?CALCIUM 9.1 8.9 9.1 9.0  --   --  8.8*  ?MG  --  1.9  --   --   --   --   --   ? ?Results/Tests Pending at Time of Discharge: None ? ?Discharge Medications:  ?Allergies as of 04/03/2021   ? ?   Reactions  ? Iodinated Contrast Media Rash  ? Pt stated in the past had broken out in a rash on back and itching.   ? Esomeprazole Magnesium   ? REACTION: stomach upset, nausea  ? ?  ? ?  ?Medication List  ?  ? ?STOP taking these medications   ? ?diltiazem 240 MG 24 hr capsule ?Commonly known as: CARDIZEM CD ?  ?metoprolol tartrate 100 MG tablet ?Commonly known as: LOPRESSOR ?  ?potassium chloride 10 MEQ tablet ?Commonly known as: KLOR-CON ?  ? ?  ? ?TAKE these medications   ? ?amiodarone 200 MG tablet ?Commonly known as: Pacerone ?Take 2 tablets (400 mg total) by mouth 2 (two) times daily for 7 days, THEN 1 tablet (200 mg total) 2 (two) times daily for 7 days, THEN 1 tablet (200 mg total) daily. ?Start taking on: April 03, 2021 ?  ?dorzolamidel-timolol 22.3-6.8 MG/ML Soln ophthalmic solution ?Commonly known as: COSOPT ?Place 1 drop into both eyes in the morning and at bedtime. ?  ?enoxaparin 80 MG/0.8ML injection ?Commonly known as: LOVENOX ?Inject 1 syringe (80 mg total) into the skin daily for 10 doses. ?  ?furosemide 40  MG tablet ?Commonly known as: LASIX ?Take 1 tablet (40 mg total) by mouth daily. ?Start taking on: April 04, 2021 ?  ?hydrALAZINE 25 MG tablet ?Commonly known as: APRESOLINE ?Take 1/2 tablet (12.5 mg total) by mouth 3 (three) times daily. ?  ?isosorbide mononitrate 30 MG 24 hr tablet ?Commonly known as: IMDUR ?Take 1/2 tablet (15 mg total) by mouth daily. ?Start taking on: April 04, 2021 ?What changed: See the new instructions. ?  ?Lumigan 0.01 % Soln ?Generic drug: bimatoprost ?Place 1 drop into both eyes at bedtime. ?  ?metoprolol succinate 100 MG 24 hr tablet ?Commonly known as: TOPROL-XL ?Take 1 tablet (100 mg total) by mouth daily. Take with or immediately following a meal. ?Start taking on: April 04, 2021 ?  ?pantoprazole 40 MG tablet ?Commonly known as: PROTONIX ?TAKE 1 TABLET(40 MG) BY MOUTH DAILY ?What changed: See the new instructions. ?  ?polyethylene glycol 17 g packet ?Commonly  known as: MIRALAX / GLYCOLAX ?Take 17 g by mouth daily as needed for mild constipation. ?  ?tamsulosin 0.4 MG Caps capsule ?Commonly known as: FLOMAX ?Take 1 capsule (0.4 mg total) by mouth daily. ?Start taking on: April 04, 2021 ?  ?Vitamin D 50 MCG (2000 UT) tablet ?Take 1 tablet (2,000 Units total) by mouth daily. ?  ?warfarin 10 MG tablet ?Commonly known as: COUMADIN ?Take as directed. If you are unsure how to take this medication, talk to your nurse or doctor. ?Original instructions: Take 1/2 to 1 tablet by mouth once daily as directed by anticoagulation clinic ?What changed:  ?how much to take ?how to take this ?when to take this ?additional instructions ?  ? ?  ? ? ?Discharge Instructions: Please refer to Patient Instructions section of EMR for full details.  Patient was counseled important signs and symptoms that should prompt return to medical care, changes in medications, dietary instructions, activity restrictions, and follow up appointments.  ? ?Follow-Up Appointments: ?Future Appointments  ?Date Time Provider  [place]  ?04/08/2021  9:45 AM [facility]  ?04/08/2021 10:50 AM ACCESS TO CARE POOL [facility]  ?04/19/2021  8:00 AM [facility] [place] [facility]  ?05/14/2021 12:45 P

## 2021-04-03 NOTE — Progress Notes (Addendum)
? ?  Spoke with [name] and she feels comfortable administering her Lovenox. She was apprehensive that she was on Lovenox longterm. Explained that she had to give herself one injection daily. She was agreeable to do this until she follows up with the Coumadin clinic on Monday.  ? ?She would like to discharge tonight. She will be staying with her daughter who is a CNA.  ?Her daughter is to pick her up.  ? ?RN staff made aware. Appreciate RN staff care of this patient.  ? ?Katha Cabal, DO ? ?

## 2021-04-03 NOTE — Progress Notes (Signed)
? ?Progress Note ? ?[name] ?Date of Encounter: 04/03/2021 ? ?[facility] Cardiologist: Cristopher Peru, MD  ? ?Subjective  ? ?Feeling well. No chest pain, sob or palpitations.   ? ?Inpatient Medications  ?  ?Scheduled Meds: ? amiodarone  400 mg Oral BID  ? Chlorhexidine Gluconate Cloth  6 each Topical Daily  ? dorzolamide-timolol  1 drop Both Eyes BID  ? feeding supplement  237 mL Oral BID BM  ? furosemide  40 mg Oral Daily  ? latanoprost  1 drop Both Eyes QHS  ? metoprolol succinate  100 mg Oral Daily  ? multivitamin with minerals  1 tablet Oral Daily  ? pantoprazole  40 mg Oral Daily  ? polyethylene glycol  17 g Oral Daily  ? sodium chloride flush  3 mL Intravenous Q12H  ? sodium chloride flush  3 mL Intravenous Q12H  ? tamsulosin  0.4 mg Oral Daily  ? warfarin  10 mg Oral ONCE-1600  ? Warfarin - Pharmacist Dosing Inpatient   Does not apply [id number]  ? ?Continuous Infusions: ? sodium chloride    ? heparin 1,100 Units/hr (04/03/21 0229)  ? ?PRN Meds: ?sodium chloride, acetaminophen, acetaminophen, albuterol, diclofenac Sodium, ondansetron (ZOFRAN) IV, senna, sodium chloride flush  ? ?Vital Signs  ?  ?Vitals:  ? 04/02/21 1233 04/02/21 1403 04/02/21 2031 04/03/21 0421  ?BP: 118/87 124/76 111/67 138/85  ?Pulse: 91 86 87 98  ?Resp: (!) 25 16 18 17   ?Temp: 97.7 ?F (36.5 ?C) 97.9 ?F (36.6 ?C) 98 ?F (36.7 ?C) 98.4 ?F (36.9 ?C)  ?TempSrc: Oral Axillary Oral Oral  ?SpO2: 95% 96% 96% 100%  ?Weight:    74.7 kg  ?Height:      ? ? ?Intake/Output Summary (Last 24 hours) at 04/03/2021 0742 ?Last data filed at 04/03/2021 0600 ?Gross per 24 hour  ?Intake 613.09 ml  ?Output 975 ml  ?Net -361.91 ml  ? ?Last 3 Weights 04/03/2021 04/02/2021 04/01/2021  ?Weight (lbs) 164 lb 10.9 oz 167 lb 15.9 oz 167 lb 1.7 oz  ?Weight (kg) 74.7 kg 76.2 kg 75.8 kg  ?   ? ?Telemetry  ?  ?Afib 80-90s - Personally Reviewed ? ?ECG  ?  ?N/A ? ?Physical Exam  ? ?GEN: No acute distress.   ?Neck: No JVD ?Cardiac: Ir IR, 2/6 systolic murmurs, rubs,  or gallops. R radial cath site without hematoma  ?Respiratory: Clear to auscultation bilaterally. ?GI: Soft, nontender, non-distended  ?MS: No edema; No deformity. ?Neuro:  Nonfocal  ?Psych: Normal affect  ? ?Labs  ?  ?High Sensitivity Troponin:   ?Recent Labs  ?Lab 03/20/21 ?1340 03/20/21 ?1647  ?TROPONINIHS 14 14  ?   ?Chemistry ?Recent Labs  ?Lab 03/31/21 ?0123 04/01/21 ?0156 04/02/21 ?0259 04/02/21 ?[id number] 04/02/21 ?[id number] 04/03/21 ?[id number]  ?NA 138 140 139 148* 142 138  ?K 3.7 3.7 3.8 2.5* 3.7 3.9  ?CL 107 109 107  --   --  106  ?CO2 23 24 24   --   --  19*  ?GLUCOSE 108* 110* 109*  --   --  108*  ?BUN 62* 46* 33*  --   --  38*  ?CREATININE 2.21* 1.89* 1.81*  --   --  1.89*  ?CALCIUM 8.9 9.1 9.0  --   --  8.8*  ?MG 1.9  --   --   --   --   --   ?GFRNONAA 22* 26* 27*  --   --  26*  ?ANIONGAP 8 7 8   --   --  13  ?  ?Hematology ?Recent Labs  ?Lab 04/01/21 ?0156 04/02/21 ?0259 04/02/21 ?2542 04/02/21 ?7062 04/03/21 ?3762  ?WBC 6.8 7.3  --   --  12.7*  ?RBC 3.68* 3.73*  --   --  3.98  ?HGB 10.2* 10.0* 8.5* 10.9* 11.0*  ?HCT 33.0* 33.0* 25.0* 32.0* 35.3*  ?MCV 89.7 88.5  --   --  88.7  ?MCH 27.7 26.8  --   --  27.6  ?MCHC 30.9 30.3  --   --  31.2  ?RDW 15.6* 15.7*  --   --  15.3  ?PLT 227 221  --   --  235  ? ?BNP ?Recent Labs  ?Lab 03/29/21 ?0214  ?BNP 513.6*  ? ? ?Radiology  ?  ?CARDIAC CATHETERIZATION ? ?Result Date: 04/02/2021 ?Findings: Ao = 135/88 (111) LV = 153/14 RA =  8 RV = 62/7 PA = 54/21 (36) PCW = 18 Fick cardiac output/index = 4.5/2.4 PVR = 4.0 Ao sat = 97% PA sat = 57%, 53% Assessment: 1. Normal coronary arteries 2. Mild pulmonary HTN 3. Relatively well compensated left-sided filling pressures 4. Mild to moderately reduced cardiac output 5. Well functioning MV leaflets on fluoro Plan/Discussion: Medical therapy. Arvilla Meres, MD 10:20 AM  ? ?Cardiac Studies  ? ?RIGHT/LEFT HEART CATH AND CORONARY ANGIOGRAPHY 04/02/21  ? ?Conclusion ? ?Findings: ?  ?Ao = 135/88 (111) ?LV = 153/14 ?RA =  8 ?RV = 62/7 ?PA =  54/21 (36) ?PCW = 18 ?Fick cardiac output/index = 4.5/2.4 ?PVR = 4.0 ?Ao sat = 97% ?PA sat = 57%, 53% ?  ?Assessment: ?1. Normal coronary arteries ?2. Mild pulmonary HTN ?3. Relatively well compensated left-sided filling pressures ?4. Mild to moderately reduced cardiac output ?5. Well functioning MV leaflets on fluoro ?  ?Plan/Discussion:  ?  ?Medical therapy. ? ?Echocardiogram 03/21/21 ? 1. Left ventricular ejection fraction, by estimation, is 35 to 40%. The  ?left ventricle has moderately decreased function. The left ventricle  ?demonstrates global hypokinesis. There is moderate left ventricular  ?hypertrophy. Left ventricular diastolic  ?function could not be evaluated.  ? 2. Right ventricular systolic function is mildly reduced. The right  ?ventricular size is normal.  ? 3. Left atrial size was Massively dilated at 126 ml/m2.  ? 4. Right atrial size was severely dilated.  ? 5. The mitral valve has been repaired/replaced. Trivial mitral valve  ?regurgitation. No evidence of mitral stenosis. There is a St. Jude  ?mechanical valve present in the mitral position. Procedure Date: 72.  ?Echo findings are consistent with normal  ?structure and function of the mitral valve prosthesis.  ? 6. The aortic valve is calcified. Aortic valve regurgitation is not  ?visualized. Mild to moderate aortic valve stenosis. Aortic valve area, by  ?VTI measures 1.48 cm?Marland Kitchen Aortic valve mean gradient measures 8.0 mmHg.  ?Aortic valve Vmax measures 1.94 m/s.  ? ?Comparison(s): Changes from prior study are noted. 07/25/2020: LVEF 50-55%.  ? ?Patient Profile  ?   ?86 y.o. female with PMH of chronic diastolic CHF, chronic atrial fibrillation on coumadin, St Jude mechanical MVR 1987, mild AS, NSVT, RBBB, HTN, CKD stage III, GERD and chronic anemia who presented on 3/1 with worsening dyspnea for 3 months. BNP elevated at 1633 with course complicated by ARF out-of-proportion to diuresis with possible element of obstructive etiology given improvement  with foley placement. Also with Afib with RVR. Cardiology is consulted for HFrEF and Afib. ? ?Assessment & Plan  ?  ?Acute on chronic combined systolic and  diastolic heart failure  ?- Echo this admission showed LVEF 35-40% (down from 50-55% in 07/2020), global hypokinesis, moderate LVH, mild RV systolic dysfunction, severe biatrial enlargement, mechanical MVR with normal function, mild-to-moderate AS. Patient initially started on Lasix, but it was discontinued due to worsening renal function with concern for possible obstruction given brisk urine output after foley placement. No hydronephrosis on ulstrasound, renal function initially improved after foley placement, but then bumped again slightly likely due to afib with soft BP. Now that afib is better controlled and BP improving, renal function is improving.  ?- Given patient's new drop in EF, difficult diuresis due to renal function/possible bladder obstruction, patient underwent R/LHC showing normal coronaries and  Mild to moderately reduced cardiac output.  ?- Net I & O negative 2.3L. weight 175>>164lb.  ?- Continue Toprol XL 100mg  qd ?- Continue lasix 40mg  qd (likely home on foley) ?- Cannot add ACE/ARNI/ARB or spironolactone due to renal function. Would like to be able to add as renal function improves, but may not be able to as her baseline creatinine is 1.5. Could consider adding an SGLT2i if GFR stays above 20  ? ?AKI on CKD stage IIIa ?- Patient developed significant AKI (peaked at 2.94), suspected to be related to bladder outlet obstruction as patient had good diuresis after foley placement. Renal ultrasound with no hydronephrosis. Renal function initially improved after foley placement, but then rose again likely due to afib with RVR with soft BP.  ?- Creatinine stable at 1.89 ?- Avoid nephrotoxic medications ?  ?Saint Jude mechanical mitral valve placed in 1987: ?- Echo this admission with normal functioning mechanical valve. +mechanical click on physical  exam  ?- Now back on Warfarin with heparin. INR 1.2 today. Goal 2.5-3.5. If going home, will need bridge with Lovenox  ? ? ?Persistent atrial fibrillation  ?- Patient has been on IV amiodarone transiti

## 2021-04-03 NOTE — Progress Notes (Signed)
She endorses no concerns today.  ?Labs and test results were discussed. ?Her physical exam was benign. ? ?A/P: ? ?Acute on chronic combined systolic and diastolic heart failure + St Jude mechanical mitral valve + Afib ?No abnormal coronary arteries findings on her RHC/LHC ?Likely home today on Toprol XL 100mg  qd, Lasix 40mg  qd  ?Outpatient PCP and cardiology follow-up for medical management monitoring and adjustment. ?We have resumed her home Coumadin regimen. I counseled her extensively this morning regarding Lovenox self-injection at home to bridge her Coumadin and outpatient INR monitoring, which she is amenable to. ?Amiodarone taper for Afib. ?  ?AKI on CKD stage IIIB: ?Improved and stable ?F/U with PCP for monitoring and consider outpatient referral to nephrology. ? ?Urine retention: ?Foley in place ?Continue Flomax ?Outpatient F/U with urology ? ?

## 2021-04-03 NOTE — TOC Transition Note (Addendum)
Transition of Care (TOC) - CM/SW Discharge Note ? ? ?Patient Details  ?Name: [name] ?MRN: [id number] ?Date of Birth: 02-12-1935 ? ?Transition of Care (TOC) CM/SW Contact:  ?Kermit Balo, RN ?Phone Number: ?04/03/2021, 3:02 PM ? ? ?Clinical Narrative:    ?Patient is discharging home today with home health services through [facility] and outpatient palliative care through [facility]. Information on the AVS.  ?Pts discharge medications to be delivered to the room per [facility] pharmacy. ?Pt has transportation home.  ? ? ?Final next level of care: Home w Home Health Services ?Barriers to Discharge: No Barriers Identified ? ? ?Patient Goals and CMS Choice ?Patient states their goals for this hospitalization and ongoing recovery are:: patient wants to return home and not be so tired. ?CMS Medicare.gov Compare Post Acute Care list provided to:: Patient ?Choice offered to / list presented to : Adult Children ? ?Discharge Placement ?  ?           ?  ?  ?  ?  ? ?Discharge Plan and Services ?  ?Discharge Planning Services: CM Consult ?Post Acute Care Choice: Home Health          ?DME Arranged: N/A ?DME Agency: NA ?  ?  ?  ?HH Arranged: RN, PT, OT ?HH Agency: [facility] ?Date HH Agency Contacted: 03/27/21 ?Time HH Agency Contacted: 1229 ?Representative spoke with at [facility] Agency: Kandee Keen ? ?Social Determinants of Health (SDOH) Interventions ?  ? ? ?Readmission Risk Interventions ?No flowsheet data found. ? ? ? ? ?

## 2021-04-03 NOTE — Progress Notes (Signed)
SATURATION QUALIFICATIONS: (This note is used to comply with regulatory documentation for home oxygen)  Patient Saturations on Room Air at Rest = 95%  Patient Saturations on Room Air while Ambulating = 90%   

## 2021-04-03 NOTE — TOC Benefit Eligibility Note (Addendum)
Patient Advocate Encounter ? ?Insurance verification completed.   ? ?The patient is currently admitted and upon discharge could be taking enoxaparin (Lovenox) 80 mg/0.8 ml. ? ?The current 5 or 10 day co-pay is, 0.00.  ? ?The patient is insured through [employer] Part D  ? ? ? ?Lyndel Safe, CPhT ?Pharmacy Patient Advocate Specialist ?[place] Patient Advocate Team ?Direct Number: [phone]  Fax: [phone] ? ? ? ? ? ?  ?

## 2021-04-03 NOTE — Progress Notes (Signed)
ANTICOAGULATION CONSULT NOTE - Follow Up Consult ? ?Pharmacy Consult for heparin>warfarin  ?Indication:  mechanical mitral valve ? ?Allergies  ?Allergen Reactions  ? Iodinated Contrast Media Rash  ?  Pt stated in the past had broken out in a rash on back and itching.   ? Esomeprazole Magnesium   ?  REACTION: stomach upset, nausea  ? ? ?Patient Measurements: ?Height: _0  (172.7 cm) ?Weight: 74.7 kg (164 lb 10.9 oz) ?IBW/kg (Calculated) : 63.9 ?Heparin Dosing Weight: 73.1 kg ? ?Vital Signs: ?Temp: 98.4 ?F (36.9 ?C) (03/15 0421) ?Temp Source: Oral (03/15 0421) ?BP: 138/85 (03/15 0421) ?Pulse Rate: 98 (03/15 0421) ? ?Labs: ?Recent Labs  ?  04/01/21 ?0156 04/02/21 ?0259 04/02/21 ?6195 04/02/21 ?0932 04/02/21 ?6712 04/02/21 ?2248 04/03/21 ?4580  ?HGB 10.2* 10.0*  --  8.5* 10.9*  --  11.0*  ?HCT 33.0* 33.0*  --  25.0* 32.0*  --  35.3*  ?PLT 227 221  --   --   --   --  235  ?LABPROT  --   --  14.3  --   --   --  14.9  ?INR  --   --  1.1  --   --   --  1.2  ?HEPARINUNFRC 0.52 0.58  --   --   --  0.44 0.55  ?CREATININE 1.89* 1.81*  --   --   --   --  1.89*  ? ? ? ?Estimated Creatinine Clearance: 22.8 mL/min (A) (by C-G formula based on SCr of 1.89 mg/dL (H)). ? ? ?Assessment: ?86 yo F with a history of mechanical mitral valve replacement/PAF, on long-term anticoagulation with warfarin. PTA warfarin dosing: 5 mg Sun/Tues/Thurs, 10 mg all other days (last dose of warfarin 3/1). Pharmacy consulted for heparin dosing.  ? ?Heparin level therapeutic at 0.55 with heparin infusing at 1100 units/hour. Hgb stable 10-11s, platelets are within normal limits. No signs of bleeding or IV site issues noted. ? ?Patient is s/p heart catheterization and was off of warfarin while awaiting the procedure. The patient received her first dose of warfarin inpatient 3/14,  but will need bridging during re-initiation of warfarin as an outpatient .  ? ?After discussion with the patient and her daughter, [facility] will discharge patient with a lovenox  bridge as outpatient, with close outpatient follow-up for an INR check and discontinuation of lovenox injections. Patient is requesting a friend of the patient's daughter assist with lovenox injections. Education will be provided to both patient and patient's daughter prior to discharge.  ? ?Of note, patient has been started on amiodarone for rhythm control while inpatient. She should have close outpatient follow-up with Coumadin Clinic to ensure appropriate warfarin dosing as amiodarone can increase INR and patient has been stable on her home regimen for many years.  ? ?Goal of Therapy:  ?Heparin level 0.3-0.7 units/ml ?Monitor platelets by anticoagulation protocol: Yes ?  ?Plan:  ?- STOP heparin  ?- Initiate Lovenox 80 mg [place] Q24H (first dose 3/15 PM)- administer at the same time heparin infusion is stopped   ?- Continue warfarin dosing per PTA schedule with close outpatient follow-up  ?- Discharge patient with Lovenox discharge education kit   ? ?Adria Dill, PharmD ?PGY-1 Acute Care Resident  ?04/03/2021 1:49 PM  ? ? ?

## 2021-04-04 ENCOUNTER — Telehealth: Payer: Self-pay | Admitting: Student

## 2021-04-04 ENCOUNTER — Other Ambulatory Visit (HOSPITAL_COMMUNITY): Payer: Self-pay

## 2021-04-04 ENCOUNTER — Ambulatory Visit: Payer: Medicare Other

## 2021-04-04 NOTE — Progress Notes (Signed)
04/04/2021 @  1430 ? ?Patients Daughter [name] called and stated that patient does not have her prescriptions.  ? ?Spoke to Dr. Madison Hickman ?Who states he sent them early in day to [facility]. ? ?Spoke to Georga Bora, PharmD about prescriptions, he states that it shows the prescriptions as delivered.   ? ?Assessed room 6e25 closet and bedside table, no medications see.  ? ? ?Follow Up: patient's daughter stated she found the medications in patient belongings. ? ?Dr. Madison Hickman to call daughter, [name] to answer a few questions. ?

## 2021-04-04 NOTE — Telephone Encounter (Signed)
Asked to call patient's daughter by RN posthospitalization.  Discussed that patient would need to follow-up with [facility] for Foley catheter and provided number for [facility].  Discussed diclofenac cream to apply to joints or muscles as needed for pain.  And discussed that patient is allowed to take Tylenol for pain but no more than 1 g at a time with less than 3 times per day.  Patient and patient's daughter were thankful for care received. ?

## 2021-04-05 ENCOUNTER — Telehealth: Payer: Self-pay

## 2021-04-05 NOTE — Telephone Encounter (Signed)
Attempted to contact patient's daughter Okey Dupre to schedule a Palliative Care consult appointment. No answer left a message to return call.  ?

## 2021-04-05 NOTE — Telephone Encounter (Signed)
Spoke with patient's daughter Okey Dupre and scheduled a FaceTime Palliative Consult for 04/11/21 @ 2:30 PM.  ? ?Consent obtained; updated Outlook/Netsmart/Team List and Epic.  ? ?

## 2021-04-07 ENCOUNTER — Other Ambulatory Visit: Payer: Self-pay | Admitting: Family Medicine

## 2021-04-07 ENCOUNTER — Telehealth: Payer: Self-pay | Admitting: Family Medicine

## 2021-04-07 NOTE — Telephone Encounter (Signed)
Received call from OOH line from pt's daughter. She has a catheter in situ which was inserted last week in the hospital.  Her daughter is concerned about a possible blocked catheter bag.  She reports that there is urine in the catheter bag but she is unable to drain it.  She does not have a spare one. She reports that urine is flowing freely from the bladder into the catheter bag. No other concerns reported. ? ?Recommended that patient is seen in the ER for further evaluation and help with changing the catheter bag. ? ?Will forward to patient's PCP Dr Nori Riis. ? ?Lattie Haw MD ?PGY-3, Family Medicine   ?

## 2021-04-08 ENCOUNTER — Other Ambulatory Visit: Payer: Self-pay

## 2021-04-08 ENCOUNTER — Ambulatory Visit (INDEPENDENT_AMBULATORY_CARE_PROVIDER_SITE_OTHER): Payer: Medicare Other

## 2021-04-08 ENCOUNTER — Other Ambulatory Visit (HOSPITAL_COMMUNITY): Payer: Self-pay

## 2021-04-08 ENCOUNTER — Ambulatory Visit (INDEPENDENT_AMBULATORY_CARE_PROVIDER_SITE_OTHER): Payer: Medicare Other | Admitting: Family Medicine

## 2021-04-08 VITALS — BP 118/70 | HR 88 | Ht 68.0 in | Wt 162.4 lb

## 2021-04-08 DIAGNOSIS — N183 Chronic kidney disease, stage 3 unspecified: Secondary | ICD-10-CM

## 2021-04-08 DIAGNOSIS — R339 Retention of urine, unspecified: Secondary | ICD-10-CM | POA: Diagnosis not present

## 2021-04-08 DIAGNOSIS — Z5181 Encounter for therapeutic drug level monitoring: Secondary | ICD-10-CM | POA: Diagnosis not present

## 2021-04-08 DIAGNOSIS — Z952 Presence of prosthetic heart valve: Secondary | ICD-10-CM | POA: Diagnosis not present

## 2021-04-08 DIAGNOSIS — I482 Chronic atrial fibrillation, unspecified: Secondary | ICD-10-CM

## 2021-04-08 DIAGNOSIS — I4891 Unspecified atrial fibrillation: Secondary | ICD-10-CM | POA: Diagnosis not present

## 2021-04-08 DIAGNOSIS — I5021 Acute systolic (congestive) heart failure: Secondary | ICD-10-CM

## 2021-04-08 DIAGNOSIS — Z7901 Long term (current) use of anticoagulants: Secondary | ICD-10-CM

## 2021-04-08 DIAGNOSIS — N179 Acute kidney failure, unspecified: Secondary | ICD-10-CM

## 2021-04-08 DIAGNOSIS — Z7189 Other specified counseling: Secondary | ICD-10-CM | POA: Diagnosis not present

## 2021-04-08 LAB — POCT INR: INR: 1.2 — AB (ref 2.0–3.0)

## 2021-04-08 NOTE — Progress Notes (Signed)
? ? ? ?  SUBJECTIVE:  ? ?CHIEF COMPLAINT / HPI:  ? ?[name] is a 86 y.o. female presents for hospital follow up  ? ?Brought in by grand daughter and daughter  ? ?Urinary retention  ARF  UTI ?Completed course of Keflex. Denies back pain or fevers. Pt has catheter in situ which was placed in the hospital due to recurrent failed voiding trials. Family reports her urine is cloudy and there have been blockages in her bladder when sitting however when she stands up the blockage is relieved. Urology appointment is next week. ? ?DOE and CP  acute on chronic CHF in setting of HFpEF  ?Reports slight worsening of bilateral leg swelling over the past few days. Denies chest pain, palpitations, dizziness, cough, dyspnea or fevers  ? ?S/P mechanical mitral valve  A-Fib ?Pt is being seen at coumadin clinic for bridging with lovenox. Seen today INR was 1.2 and subtherapeutic. ? ?[place] daughter would like [name] aid for pt. ? ?[facility] Office Visit from 04/08/2021 in [facility]  ?PHQ-9 Total Score 4  ? ?  ?   ? ?PERTINENT  PMH / PSH: CKD stage III, urinary retention ? ?OBJECTIVE:  ? ?BP 118/70   Pulse 88   Ht 5\' 8"  (1.727 m)   Wt 162 lb 6.4 oz (73.7 kg)   SpO2 95%   BMI 24.69 kg/m?   ? ?General: Alert, no acute distress ?Cardio: Normal S1 and S2, RRR, no r/m/g ?Pulm: CTAB, normal work of breathing ?Abdomen: Bowel sounds normal. Abdomen soft and non-tender.  Foley catheter in situ draining clear urine ?Extremities: No peripheral edema.  ?Neuro: Cranial nerves grossly intact  ? ?ASSESSMENT/PLAN:  ? ?Urine retention ?Stable, follow up with Urology for urodynamic studies and voiding trials. ? ?CKD (chronic kidney disease), stage III ([place]) ?Repeated BMP today. If worsening will contact nephrology. ? ?Long term current use of anticoagulant therapy ?Continue lovenox injections. Pt is being seen at coumadin clinic. INR currently subtherapeutic at 1.3 ? ?Acute congestive heart failure ([place]) ?Stable and  resolved. Continue Lasix 40mg  daily. ?  ? ?Tami Haw, MD PGY-3 ?[place]   ?

## 2021-04-08 NOTE — Patient Instructions (Signed)
Description   ?Take 1.5 tablets today, then take 1 tablet tomorrow, then resume same dosage 1 tablet daily except for 1/2 a tablet on Sundays, Tuesdays and Thursdays. Continue Lovenox injections 80mg  once daily.  Pt was started on Amiodarone at hospital discharge, loading dosage started 04/03/21.  Be consistent with your 3 serving of greens per week and your 4 ensures a week. Recheck INR on Friday. Coumadin Clinic [phone].  ?  ?  ?

## 2021-04-08 NOTE — Patient Instructions (Signed)
Thank you for coming to see me today. It was a pleasure. You are doing well after being in hospital. I recommend: ?Continue lovenox injections, continue to follow up with coumadin clinic ?Continue lasix, compression stockings ?I will place order for home health aid ?Follow up on Wednesday for urology follow up ? ?We will get some labs today.  If they are abnormal or we need to do something about them, I will call you.  If they are normal, I will send you a message on MyChart (if it is active) or a letter in the mail.  If you don't hear from [place] in 2 weeks, please call the office at the number below.  ? ?If you get worsening shortness of breath,chest pain, dizziness etc, then please go to to the ER ? ?Please follow-up with Dr Jennette Kettle as needed ? ?If you have any questions or concerns, please do not hesitate to call the office at [phone]. ? ?Best wishes,  ? ?Dr Allena Katz   ?

## 2021-04-10 DIAGNOSIS — R338 Other retention of urine: Secondary | ICD-10-CM | POA: Diagnosis not present

## 2021-04-11 ENCOUNTER — Other Ambulatory Visit: Payer: Self-pay

## 2021-04-11 ENCOUNTER — Other Ambulatory Visit: Payer: Medicare Other | Admitting: Hospice

## 2021-04-12 ENCOUNTER — Ambulatory Visit (INDEPENDENT_AMBULATORY_CARE_PROVIDER_SITE_OTHER): Payer: Medicare Other

## 2021-04-12 ENCOUNTER — Other Ambulatory Visit: Payer: Self-pay

## 2021-04-12 DIAGNOSIS — I482 Chronic atrial fibrillation, unspecified: Secondary | ICD-10-CM

## 2021-04-12 DIAGNOSIS — Z7189 Other specified counseling: Secondary | ICD-10-CM | POA: Diagnosis not present

## 2021-04-12 DIAGNOSIS — N179 Acute kidney failure, unspecified: Secondary | ICD-10-CM | POA: Diagnosis not present

## 2021-04-12 DIAGNOSIS — Z952 Presence of prosthetic heart valve: Secondary | ICD-10-CM

## 2021-04-12 DIAGNOSIS — I4891 Unspecified atrial fibrillation: Secondary | ICD-10-CM

## 2021-04-12 DIAGNOSIS — Z5181 Encounter for therapeutic drug level monitoring: Secondary | ICD-10-CM | POA: Diagnosis not present

## 2021-04-12 LAB — POCT INR: INR: 2.6 (ref 2.0–3.0)

## 2021-04-12 NOTE — Patient Instructions (Signed)
-   STOP LOVENOX INJECTIONS ?- continue same dosage 1 tablet daily except for 1/2 a tablet on Sundays, Tuesdays and Thursdays. Pt was started on Amiodarone at hospital discharge, loading dosage started 04/03/21.  Be consistent with your 3 serving of greens per week and your 4 ensures a week.  ?- Recheck INR next week.  ?Coumadin Clinic [phone].  ?

## 2021-04-13 LAB — BASIC METABOLIC PANEL
BUN/Creatinine Ratio: 16 (ref 12–28)
BUN: 42 mg/dL — ABNORMAL HIGH (ref 8–27)
CO2: 23 mmol/L (ref 20–29)
Calcium: 9.6 mg/dL (ref 8.7–10.3)
Chloride: 104 mmol/L (ref 96–106)
Creatinine, Ser: 2.64 mg/dL — ABNORMAL HIGH (ref 0.57–1.00)
Glucose: 90 mg/dL (ref 70–99)
Potassium: 3.4 mmol/L — ABNORMAL LOW (ref 3.5–5.2)
Sodium: 143 mmol/L (ref 134–144)
eGFR: 17 mL/min/{1.73_m2} — ABNORMAL LOW (ref >59)

## 2021-04-14 ENCOUNTER — Telehealth: Payer: Self-pay | Admitting: Family Medicine

## 2021-04-14 NOTE — Telephone Encounter (Signed)
Ordering provider: Towanda Octave. ? ?I attempted to discuss lab result with her. She stated that her landline is not clear and wanted me to call on her Mobile which I called previously. She then stated that she can't find her mobile phone. She will call the office on Monday to discuss result. ? ?I will forward to PCP and Dr. Allena Katz to follow up with her. ?Kidney function worsens - she need nephrology referral and close monitoring. ?Need to discuss restarting potassium supplement or repeat potassium level. ? ?Dr. Allena Katz - This lab you ordered came to my inbox, please follow-up with the patient with her test result. I also copied her PCP. Thanks. ?

## 2021-04-16 ENCOUNTER — Telehealth: Payer: Self-pay | Admitting: Family Medicine

## 2021-04-16 NOTE — Assessment & Plan Note (Signed)
Continue lovenox injections. Pt is being seen at coumadin clinic. INR currently subtherapeutic at 1.3 ?

## 2021-04-16 NOTE — Telephone Encounter (Signed)
SPOKE W HER VIA PHONE ?SHE HAS SOME QUESTIONS ABOUT VARIOUS THINGS INCL BLADDER BAG WHICH I ASKED HER TO BRING WITH HER ?WILL ALSO RECHECK POTASSIUM AND UPDATE HER MEDS ?

## 2021-04-16 NOTE — Assessment & Plan Note (Signed)
Repeated BMP today. If worsening will contact nephrology. ?

## 2021-04-16 NOTE — Telephone Encounter (Signed)
Thanks for letting me know Dr Jennette Kettle. Titus Dubin to know she is being seen by nephro now! ?

## 2021-04-16 NOTE — Assessment & Plan Note (Signed)
Stable, follow up with Urology for urodynamic studies and voiding trials. ?

## 2021-04-16 NOTE — Assessment & Plan Note (Addendum)
Stable and resolved. Continue Lasix 40mg  daily. ?

## 2021-04-17 ENCOUNTER — Ambulatory Visit (INDEPENDENT_AMBULATORY_CARE_PROVIDER_SITE_OTHER): Payer: Medicare Other | Admitting: Family Medicine

## 2021-04-17 VITALS — BP 132/70 | HR 70

## 2021-04-17 DIAGNOSIS — I5042 Chronic combined systolic (congestive) and diastolic (congestive) heart failure: Secondary | ICD-10-CM | POA: Diagnosis not present

## 2021-04-17 DIAGNOSIS — R269 Unspecified abnormalities of gait and mobility: Secondary | ICD-10-CM | POA: Diagnosis not present

## 2021-04-17 DIAGNOSIS — I482 Chronic atrial fibrillation, unspecified: Secondary | ICD-10-CM

## 2021-04-17 DIAGNOSIS — I10 Essential (primary) hypertension: Secondary | ICD-10-CM | POA: Diagnosis not present

## 2021-04-17 DIAGNOSIS — R339 Retention of urine, unspecified: Secondary | ICD-10-CM

## 2021-04-17 DIAGNOSIS — I4729 Other ventricular tachycardia: Secondary | ICD-10-CM

## 2021-04-17 DIAGNOSIS — N179 Acute kidney failure, unspecified: Secondary | ICD-10-CM | POA: Diagnosis not present

## 2021-04-17 MED ORDER — METOPROLOL SUCCINATE ER 100 MG PO TB24: ORAL_TABLET | ORAL | 0 refills | Status: DC

## 2021-04-17 MED ORDER — TAMSULOSIN HCL 0.4 MG PO CAPS: 0.4000 mg | ORAL_CAPSULE | Freq: Every day | ORAL | 3 refills | Status: DC

## 2021-04-17 MED ORDER — AMIODARONE HCL 200 MG PO TABS: ORAL_TABLET | ORAL | 3 refills | Status: DC

## 2021-04-17 MED ORDER — HYDRALAZINE HCL 25 MG PO TABS: ORAL_TABLET | ORAL | 3 refills | Status: DC

## 2021-04-17 MED ORDER — ISOSORBIDE MONONITRATE ER 30 MG PO TB24: ORAL_TABLET | ORAL | 3 refills | Status: DC

## 2021-04-17 MED ORDER — FUROSEMIDE 40 MG PO TABS
40.0000 mg | ORAL_TABLET | Freq: Every day | ORAL | 3 refills | Status: DC
Start: 2021-04-17 — End: 2022-07-07

## 2021-04-17 NOTE — Assessment & Plan Note (Signed)
Reviewed her work-up in the hospital.  We will continue her current medications.  We will get labs today as her last check had a slightly low potassium.  I will let her know if we need to start her back on that.  She would like to get rid of some of these medicines if possible, we will follow her over the next month and see how she does.  Not clear if she has follow-up with cardiology or not.  She will check into that. ?

## 2021-04-17 NOTE — Assessment & Plan Note (Signed)
Medicines for the range during hospitalization.  We reviewed those today.  Blood pressure seems pretty well controlled.  She did have 1 little episode of dizziness today when she was resting but she thinks she was just rushing around.  Would probably benefit from PT OT home health and I have requested that.  Follow-up with me in 1 month. ?

## 2021-04-17 NOTE — Progress Notes (Signed)
? ? ?CHIEF COMPLAINT / HPI: ?Follow-up recent hospitalization, follow-up urinary retention in congestive heart failure, atrial fibrillation.  Patient was started on amiodarone in the hospital.  Had some abnormal lab values at initial hospital follow-up visit. ? ?#1.  CHF/A-fib: Today she is here with her daughter.  Says she is doing overall pretty well.  Did have a little episode of shortness of breath and some mild dizziness today when she was rushing around trying to get ready for the appointment but in general she says her breathing is fine.  Much better than before the hospitalization. ? ?She has questions about medications and how long she will have to take them.  No problems with the current medication regimen however.  She does need refills. ? ?#2.  Urinary retention now with indwelling Foley catheter she is still using indwelling Foley catheter.  Has seen the urologist and has follow-up with them in the next 3 to 4 weeks where they plan to do some type of urodynamic studies.  She is not quite sure why she has to wear the catheter bag and wishes they would give her a trial of voiding on her own. ? ?3.  Gait abnormality and mobility issues: Would like to have a rollator with a seat as she does get occasionally a little unsteady on her feet.  She has not been out much but she would like to start getting back out. ? ?4.  Long-term anticoagulation: Being followed by the Coumadin clinic and has questions about this new medication, amiodarone and how it would affect her Coumadin. ? ? ?PERTINENT  PMH / PSH: I have reviewed the patient?s medications, allergies, past medical and surgical history, smoking status and updated in the EMR as appropriate. ? ? ?OBJECTIVE: ? There were no vitals taken for this visit. ?CV Irreg irreg + m ?Lungs CTA B ?Ext no edema ?Gait: unsteady. Can rise from a chair but has to use assitance to fully stand ?PSYCH: AxOx4. Good eye contact.. No psychomotor retardation or agitation.  Appropriate speech fluency and content. Asks and answers questions appropriately. Mood is congruent. ?HEENT decreased hearing B ? ?ASSESSMENT / PLAN: ? ? ?Urine retention ?Discussed urinary retention and the need for indwelling Foley catheter.  Received a note from urology saying they are planning to do urodynamic studies and follow her back up in their office in the next 3 to 4 weeks.  I think she is going to have to continue the Foley catheter for right now.  Hopefully, they will eventually be able to do a trial without it.  That is her hope.  I will order an extra catheter bag for her to have at home. ? ?Chronic atrial fibrillation ([place]) ?Hospitalized recently for acute heart failure with likely component of A-fib RVR at the time.  They started her on amiodarone.  She has completed the taper up and is now on 200 mg a day.  We discussed.  This will likely be a long-term medicine for her.  She is being followed by the Coumadin clinic at cardiology closely over the next few weeks since she has just started the amiodarone.  I will do refills. ? ?VENTRICULAR TACHYCARDIA ?Medicines for the range during hospitalization.  We reviewed those today.  Blood pressure seems pretty well controlled.  She did have 1 little episode of dizziness today when she was resting but she thinks she was just rushing around.  Would probably benefit from PT OT home health and I have requested that.  Follow-up with me in 1 month. ? ?Chronic combined systolic and diastolic congestive heart failure ([place]) ?Reviewed her work-up in the hospital.  We will continue her current medications.  We will get labs today as her last check had a slightly low potassium.  I will let her know if we need to start her back on that.  She would like to get rid of some of these medicines if possible, we will follow her over the next month and see how she does.  Not clear if she has follow-up with cardiology or not.  She will check into that.  ?Dorcas Mcmurray MD ?

## 2021-04-17 NOTE — Assessment & Plan Note (Signed)
Hospitalized recently for acute heart failure with likely component of A-fib RVR at the time.  They started her on amiodarone.  She has completed the taper up and is now on 200 mg a day.  We discussed.  This will likely be a long-term medicine for her.  She is being followed by the Coumadin clinic at cardiology closely over the next few weeks since she has just started the amiodarone.  I will do refills. ?

## 2021-04-17 NOTE — Assessment & Plan Note (Signed)
Discussed urinary retention and the need for indwelling Foley catheter.  Received a note from urology saying they are planning to do urodynamic studies and follow her back up in their office in the next 3 to 4 weeks.  I think she is going to have to continue the Foley catheter for right now.  Hopefully, they will eventually be able to do a trial without it.  That is her hope.  I will order an extra catheter bag for her to have at home. ?

## 2021-04-17 NOTE — Patient Instructions (Addendum)
[name] will help get your [facility]p[employer] set up and we will get our social worker to help with the personal care services application. ? ?Please make an appointment to see me in  the week or two after you see the urologist (Dr. Geni Bers) at the end of April ? ? ?

## 2021-04-18 ENCOUNTER — Telehealth: Payer: Self-pay | Admitting: *Deleted

## 2021-04-18 LAB — BASIC METABOLIC PANEL
BUN/Creatinine Ratio: 15 (ref 12–28)
BUN: 47 mg/dL — ABNORMAL HIGH (ref 8–27)
CO2: 19 mmol/L — ABNORMAL LOW (ref 20–29)
Calcium: 9.8 mg/dL (ref 8.7–10.3)
Chloride: 109 mmol/L — ABNORMAL HIGH (ref 96–106)
Creatinine, Ser: 3.06 mg/dL — ABNORMAL HIGH (ref 0.57–1.00)
Glucose: 91 mg/dL (ref 70–99)
Potassium: 3.6 mmol/L (ref 3.5–5.2)
Sodium: 146 mmol/L — ABNORMAL HIGH (ref 134–144)
eGFR: 15 mL/min/{1.73_m2} — ABNORMAL LOW (ref >59)

## 2021-04-18 NOTE — Telephone Encounter (Signed)
Community message sent to [employer], [place], Colletta [place] and Shane Crutch @ [facility] to process DME order for Foley bag and rollator.  ? ?Jone Baseman, CMA ? ?

## 2021-04-18 NOTE — Telephone Encounter (Signed)
Received and they will process now.  [name], CMA ? ?

## 2021-04-19 ENCOUNTER — Other Ambulatory Visit: Payer: Medicare Other

## 2021-04-22 ENCOUNTER — Other Ambulatory Visit: Payer: Medicare Other | Admitting: Hospice

## 2021-04-22 ENCOUNTER — Encounter: Payer: Self-pay | Admitting: Family Medicine

## 2021-04-23 DIAGNOSIS — R269 Unspecified abnormalities of gait and mobility: Secondary | ICD-10-CM | POA: Diagnosis not present

## 2021-04-24 ENCOUNTER — Ambulatory Visit (INDEPENDENT_AMBULATORY_CARE_PROVIDER_SITE_OTHER): Payer: Medicare Other

## 2021-04-24 DIAGNOSIS — Z952 Presence of prosthetic heart valve: Secondary | ICD-10-CM | POA: Diagnosis not present

## 2021-04-24 DIAGNOSIS — I482 Chronic atrial fibrillation, unspecified: Secondary | ICD-10-CM

## 2021-04-24 DIAGNOSIS — Z7189 Other specified counseling: Secondary | ICD-10-CM

## 2021-04-24 DIAGNOSIS — Z5181 Encounter for therapeutic drug level monitoring: Secondary | ICD-10-CM | POA: Diagnosis not present

## 2021-04-24 LAB — POCT INR: INR: 5.8 — AB (ref 2.0–3.0)

## 2021-04-24 NOTE — Patient Instructions (Signed)
Description   ?Hold today's dose and tomorrow's dose and then START taking 1/2 tablet daily except for 1 tablet on Mondays and Wednesday.  ? Pt was started on Amiodarone at hospital discharge, loading dosage started 04/03/21.   ?Be consistent with your 3 serving of greens per week and your 4 ensures a week.  ?- Recheck INR next week.  ?Coumadin Clinic [phone].  ?  ?   ?

## 2021-04-25 DIAGNOSIS — Z960 Presence of urogenital implants: Secondary | ICD-10-CM | POA: Diagnosis not present

## 2021-04-25 DIAGNOSIS — Z7901 Long term (current) use of anticoagulants: Secondary | ICD-10-CM | POA: Diagnosis not present

## 2021-04-25 DIAGNOSIS — I110 Hypertensive heart disease with heart failure: Secondary | ICD-10-CM | POA: Diagnosis not present

## 2021-04-25 DIAGNOSIS — Z9181 History of falling: Secondary | ICD-10-CM | POA: Diagnosis not present

## 2021-04-25 DIAGNOSIS — R339 Retention of urine, unspecified: Secondary | ICD-10-CM | POA: Diagnosis not present

## 2021-04-25 DIAGNOSIS — I4729 Other ventricular tachycardia: Secondary | ICD-10-CM | POA: Diagnosis not present

## 2021-04-25 DIAGNOSIS — I5042 Chronic combined systolic (congestive) and diastolic (congestive) heart failure: Secondary | ICD-10-CM | POA: Diagnosis not present

## 2021-04-25 DIAGNOSIS — I482 Chronic atrial fibrillation, unspecified: Secondary | ICD-10-CM | POA: Diagnosis not present

## 2021-04-26 ENCOUNTER — Other Ambulatory Visit: Payer: Medicare Other

## 2021-04-26 ENCOUNTER — Other Ambulatory Visit: Payer: Self-pay | Admitting: Family Medicine

## 2021-04-29 DIAGNOSIS — Z7901 Long term (current) use of anticoagulants: Secondary | ICD-10-CM | POA: Diagnosis not present

## 2021-04-29 DIAGNOSIS — I5042 Chronic combined systolic (congestive) and diastolic (congestive) heart failure: Secondary | ICD-10-CM | POA: Diagnosis not present

## 2021-04-29 DIAGNOSIS — I482 Chronic atrial fibrillation, unspecified: Secondary | ICD-10-CM | POA: Diagnosis not present

## 2021-04-29 DIAGNOSIS — I110 Hypertensive heart disease with heart failure: Secondary | ICD-10-CM | POA: Diagnosis not present

## 2021-04-29 DIAGNOSIS — Z960 Presence of urogenital implants: Secondary | ICD-10-CM | POA: Diagnosis not present

## 2021-04-29 DIAGNOSIS — R339 Retention of urine, unspecified: Secondary | ICD-10-CM | POA: Diagnosis not present

## 2021-04-29 DIAGNOSIS — Z9181 History of falling: Secondary | ICD-10-CM | POA: Diagnosis not present

## 2021-04-29 DIAGNOSIS — I4729 Other ventricular tachycardia: Secondary | ICD-10-CM | POA: Diagnosis not present

## 2021-04-30 ENCOUNTER — Telehealth: Payer: Self-pay

## 2021-04-30 ENCOUNTER — Ambulatory Visit (INDEPENDENT_AMBULATORY_CARE_PROVIDER_SITE_OTHER): Payer: Medicare Other

## 2021-04-30 ENCOUNTER — Telehealth: Payer: Self-pay | Admitting: Hospice

## 2021-04-30 DIAGNOSIS — Z5181 Encounter for therapeutic drug level monitoring: Secondary | ICD-10-CM

## 2021-04-30 DIAGNOSIS — I482 Chronic atrial fibrillation, unspecified: Secondary | ICD-10-CM

## 2021-04-30 DIAGNOSIS — Z952 Presence of prosthetic heart valve: Secondary | ICD-10-CM | POA: Diagnosis not present

## 2021-04-30 DIAGNOSIS — Z7189 Other specified counseling: Secondary | ICD-10-CM

## 2021-04-30 LAB — POCT INR: INR: 2.5 (ref 2.0–3.0)

## 2021-04-30 NOTE — Telephone Encounter (Signed)
Attempted to contact daughter [name], to reschedule Palliative Consult, no answer - left VM asking her to return my call by the end of the day so that we could reschedule and that if she did not call me back that we would cancel the referral.  ?

## 2021-04-30 NOTE — Telephone Encounter (Signed)
[name] [name] PT calls nurse line requesting verbal orders for [name] PT as follows.  ? ?1x a week for 8 weeks.  ? ?Verbal order given.  ?

## 2021-04-30 NOTE — Patient Instructions (Signed)
Description   ?Start taking 1/2 tablet daily except for 1 tablet on Mondays and Fridays.  Recheck in 2 weeks.  Pt was started on Amiodarone at hospital discharge, loading dosage started 04/03/21.  Be consistent with your 3 serving of greens per week and your 4 ensures a week. ?Coumadin Clinic [phone].  ?  ?  ?

## 2021-05-01 ENCOUNTER — Telehealth: Payer: Self-pay

## 2021-05-01 DIAGNOSIS — I482 Chronic atrial fibrillation, unspecified: Secondary | ICD-10-CM | POA: Diagnosis not present

## 2021-05-01 DIAGNOSIS — Z960 Presence of urogenital implants: Secondary | ICD-10-CM | POA: Diagnosis not present

## 2021-05-01 DIAGNOSIS — Z9181 History of falling: Secondary | ICD-10-CM | POA: Diagnosis not present

## 2021-05-01 DIAGNOSIS — I5042 Chronic combined systolic (congestive) and diastolic (congestive) heart failure: Secondary | ICD-10-CM | POA: Diagnosis not present

## 2021-05-01 DIAGNOSIS — R339 Retention of urine, unspecified: Secondary | ICD-10-CM | POA: Diagnosis not present

## 2021-05-01 DIAGNOSIS — I110 Hypertensive heart disease with heart failure: Secondary | ICD-10-CM | POA: Diagnosis not present

## 2021-05-01 DIAGNOSIS — Z7901 Long term (current) use of anticoagulants: Secondary | ICD-10-CM | POA: Diagnosis not present

## 2021-05-01 DIAGNOSIS — I4729 Other ventricular tachycardia: Secondary | ICD-10-CM | POA: Diagnosis not present

## 2021-05-01 NOTE — Telephone Encounter (Signed)
Engineer, drilling requesting verbal orders for home health nursing as follows.  ? ?1x a week for 4 weeks. ? ?Verbal order given.  ?

## 2021-05-02 DIAGNOSIS — R339 Retention of urine, unspecified: Secondary | ICD-10-CM | POA: Diagnosis not present

## 2021-05-02 DIAGNOSIS — Z9181 History of falling: Secondary | ICD-10-CM | POA: Diagnosis not present

## 2021-05-02 DIAGNOSIS — I5042 Chronic combined systolic (congestive) and diastolic (congestive) heart failure: Secondary | ICD-10-CM | POA: Diagnosis not present

## 2021-05-02 DIAGNOSIS — I110 Hypertensive heart disease with heart failure: Secondary | ICD-10-CM | POA: Diagnosis not present

## 2021-05-02 DIAGNOSIS — Z960 Presence of urogenital implants: Secondary | ICD-10-CM | POA: Diagnosis not present

## 2021-05-02 DIAGNOSIS — I482 Chronic atrial fibrillation, unspecified: Secondary | ICD-10-CM | POA: Diagnosis not present

## 2021-05-02 DIAGNOSIS — Z7901 Long term (current) use of anticoagulants: Secondary | ICD-10-CM | POA: Diagnosis not present

## 2021-05-02 DIAGNOSIS — I4729 Other ventricular tachycardia: Secondary | ICD-10-CM | POA: Diagnosis not present

## 2021-05-06 DIAGNOSIS — Z960 Presence of urogenital implants: Secondary | ICD-10-CM | POA: Diagnosis not present

## 2021-05-06 DIAGNOSIS — I5042 Chronic combined systolic (congestive) and diastolic (congestive) heart failure: Secondary | ICD-10-CM | POA: Diagnosis not present

## 2021-05-06 DIAGNOSIS — I4729 Other ventricular tachycardia: Secondary | ICD-10-CM | POA: Diagnosis not present

## 2021-05-06 DIAGNOSIS — Z9181 History of falling: Secondary | ICD-10-CM | POA: Diagnosis not present

## 2021-05-06 DIAGNOSIS — I482 Chronic atrial fibrillation, unspecified: Secondary | ICD-10-CM | POA: Diagnosis not present

## 2021-05-06 DIAGNOSIS — R339 Retention of urine, unspecified: Secondary | ICD-10-CM | POA: Diagnosis not present

## 2021-05-06 DIAGNOSIS — I110 Hypertensive heart disease with heart failure: Secondary | ICD-10-CM | POA: Diagnosis not present

## 2021-05-06 DIAGNOSIS — Z7901 Long term (current) use of anticoagulants: Secondary | ICD-10-CM | POA: Diagnosis not present

## 2021-05-06 NOTE — Progress Notes (Signed)
? ?Cardiology Office Note   ? ?Date:  05/14/2021  ? ?ID:  [name], DOB 09-05-1935, MRN [id number] ? ? ?PCP:  Dickie La, MD ?  ?[place]  ?Cardiologist:  Cristopher Peru, MD   ?Advanced Practice Provider:  No care team member to display ?Electrophysiologist:  None  ? ?[id number]  ? ?Chief Complaint  ?Patient presents with  ? Hospitalization Follow-up  ? ? ?History of Present Illness:  ?[name] is a 86 y.o. female  with PMH of chronic diastolic CHF, chronic atrial fibrillation on coumadin, St Jude mechanical MVR 1987, mild AS, NSVT, RBBB, HTN, CKD stage III, GERD and chronic anemia. ? ?She presented on 3/1 with worsening dyspnea for 3 months. BNP elevated at 1633 with course complicated by ARF out-of-proportion to diuresis with possible element of obstructive etiology given improvement with foley placement. Also with Afib with RVR. Cardiology was consulted for HFrEF and Afib. She underwent LHC for newly reduced EF and had normal CORS.  Wedge mildly elevated. She clinically improved. Can continue oral lasix 40 mg. Crt is stable. Continue oral amiodarone. Afib is rate controlled.   ? ?Patient comes in for f/u with her daughter. Denies dyspnea, has chronic swelling in her right leg because of vein issues treated with compression hose. Crt 3.06 04/17/21. BP up today. She hasn't taken any of her meds. Has a nurse check her weekly and says it's ok. Forgot to bring the numbers. ? ? ? ?Past Medical History:  ?Diagnosis Date  ? Asthma   ? years ago  ? Chronic atrial fibrillation (HCC)   ? Dilated cardiomyopathy ([place])   ? history of this, now resolved  ? Dysphagia   ? Hx  ? Fracture of tibial plateau 05/21/2015  ? GERD (gastroesophageal reflux disease)   ? GI bleed   ? Hemorrhoid 04/2014  ? bleeding  ? History of blood transfusion   ? Hypertension   ? Microcytic anemia   ? Nonsustained ventricular tachycardia ([place])   ? Osteoporosis   ? Protein in urine   ? RBBB (right bundle branch block)    ? Rheumatic mitral valve disease   ? ? ?Past Surgical History:  ?Procedure Laterality Date  ? CHOLECYSTECTOMY  2005  ? COLONOSCOPY    ? COLONOSCOPY W/ POLYPECTOMY    ? EYE SURGERY  04/2014  ? HEMICOLECTOMY  2008  ? forpolyps  ? MITOMYCIN C APPLICATION Right [id number]  ? Procedure: MITOMYCIN C APPLICATION;  Surgeon: Marylynn Pearson, MD;  Location: [facility];  Service: Ophthalmology;  Laterality: Right;  ? MITRAL VALVE REPLACEMENT  1987  ? St Jude mechanical valve  ? RIGHT/LEFT HEART CATH AND CORONARY ANGIOGRAPHY N/A 04/02/2021  ? Procedure: RIGHT/LEFT HEART CATH AND CORONARY ANGIOGRAPHY;  Surgeon: Jolaine Artist, MD;  Location: [facility];  Service: Cardiovascular;  Laterality: N/A;  ? TRABECULECTOMY Right 05/17/2014  ? Procedure: TRABECULECTOMY WITH MITOMYCIN RIGHT EYE;  Surgeon: Marylynn Pearson, MD;  Location: [facility];  Service: Ophthalmology;  Laterality: Right;  ? TUBAL LIGATION    ? US ECHOCARDIOGRAPHY  02/2008, 03/2006, 06/2004  ? ? ?Current Medications: ?Current Meds  ?Medication Sig  ? amiodarone (PACERONE) 200 MG tablet Take one by mouth daily  ? Cholecalciferol (VITAMIN D) 2000 units tablet Take 1 tablet (2,000 Units total) by mouth daily.  ? dorzolamidel-timolol (COSOPT) 22.3-6.8 MG/ML SOLN ophthalmic solution Place 1 drop into both eyes in the morning and at bedtime.  ? furosemide (LASIX) 40 MG tablet  Take 1 tablet (40 mg total) by mouth daily.  ? hydrALAZINE (APRESOLINE) 25 MG tablet Take one half tablet three times a day with food  ? isosorbide mononitrate (IMDUR) 30 MG 24 hr tablet Take one half tablet by mouth daily  ? LUMIGAN 0.01 % SOLN Place 1 drop into both eyes at bedtime.  ? metoprolol succinate (TOPROL-XL) 100 MG 24 hr tablet TAKE 1 TABLET BY MOUTH DAILY  ? pantoprazole (PROTONIX) 40 MG tablet TAKE 1 TABLET(40 MG) BY MOUTH DAILY  ? polyethylene glycol (MIRALAX / GLYCOLAX) 17 g packet Take 17 g by mouth daily as needed for mild constipation.  ? tamsulosin (FLOMAX) 0.4 MG CAPS capsule Take 1 capsule  (0.4 mg total) by mouth daily.  ? warfarin (COUMADIN) 10 MG tablet Take 1/2 to 1 tablet by mouth once daily as directed by anticoagulation clinic (Patient taking differently: Take 5-10 mg by mouth as directed. 5mg  once daily on Sunday's, Tuesday's, and Thursday's, and 10mg  once daily on all other days of the week)  ?  ? ?Allergies:   Iodinated contrast media and Esomeprazole magnesium  ? ?Social History  ? ?Socioeconomic History  ? Marital status: Divorced  ?  Spouse name: Not on file  ? Number of children: 5  ? Years of education: 41  ? Highest education level: High school graduate  ?Occupational History  ? Occupation: Retired  ?Tobacco Use  ? Smoking status: Former  ?  Packs/day: 1.50  ?  Years: 15.00  ?  Pack years: 22.50  ?  Types: Cigarettes  ?  Quit date: 71  ?  Years since quitting: 36.3  ? Smokeless tobacco: Never  ? Tobacco comments:  ?  quit 1987  ?Vaping Use  ? Vaping Use: Never used  ?Substance and Sexual Activity  ? Alcohol use: No  ? Drug use: No  ? Sexual activity: Not Currently  ?Other Topics Concern  ? Not on file  ?Social History Narrative  ? Patient lives alone in [place].  ? Patient has 3 children who live near her and offer support. 2 live up [place].  ? Patient is active in [place] and enjoys walking for exercise.   ? ?Social Determinants of Health  ? ?Financial Resource Strain: Not on file  ?Food Insecurity: Not on file  ?Transportation Needs: Not on file  ?Physical Activity: Not on file  ?Stress: Not on file  ?Social Connections: Not on file  ?  ? ?Family History:  The patient's  family history includes Blindness in her sister; Cancer in her father and sister; Endometriosis in her daughter; Hypertension in her daughter, daughter, daughter, mother, and sister; Liver disease in her daughter; Stroke in her mother and sister; Thyroid disease in her daughter.  ? ?ROS:   ?Please see the history of present illness.    ?ROS All other systems reviewed and are negative. ? ? ?PHYSICAL EXAM:   ?VS:  BP  (!) 155/104   Pulse 68   Ht 5\' 8"  (1.727 m)   Wt 166 lb (75.3 kg)   SpO2 98%   BMI 25.24 kg/m?   ?Physical Exam  ?GEN: Well nourished, well developed, in no acute distress  ?Neck: no JVD, carotid bruits, or masses ?Cardiac:RRR; Crisp valves, 2/6 systolic murmur LSB ?Respiratory:  clear to auscultation bilaterally, normal work of breathing ?GI: soft, nontender, nondistended, + BS ?Ext: chronic LE edema without cyanosis, clubbing, Good distal pulses bilaterally ?Neuro:  Alert and Oriented x 3,  ?Psych: euthymic mood, full affect ? ?  Wt Readings from Last 3 Encounters:  ?05/14/21 166 lb (75.3 kg)  ?04/08/21 162 lb 6.4 oz (73.7 kg)  ?04/03/21 164 lb 10.9 oz (74.7 kg)  ?  ? ? ?Studies/Labs Reviewed:  ? ?EKG:  EKG is not ordered today.    ? ?Recent Labs: ?07/25/2020: TSH 1.967 ?03/25/2021: ALT 15 ?03/29/2021: B Natriuretic Peptide 513.6 ?03/31/2021: Magnesium 1.9 ?04/03/2021: Hemoglobin 11.0; Platelets 235 ?04/17/2021: BUN 47; Creatinine, Ser 3.06; Potassium 3.6; Sodium 146  ? ?Lipid Panel ?   ?Component Value Date/Time  ? CHOL 180 07/26/2020 0515  ? TRIG 118 07/26/2020 0515  ? HDL 58 07/26/2020 0515  ? CHOLHDL 3.1 07/26/2020 0515  ? VLDL 24 07/26/2020 0515  ? [place] 98 07/26/2020 0515  ? LDLDIRECT 100 (H) 06/10/2017 1352  ? ? ?Additional studies/ records that were reviewed today include:  ?RIGHT/LEFT HEART CATH AND CORONARY ANGIOGRAPHY 04/02/21  ?  ?Conclusion ?  ?Findings: ?  ?Ao = 135/88 (111) ?LV = 153/14 ?RA =  8 ?RV = 62/7 ?PA = 54/21 (36) ?PCW = 18 ?Fick cardiac output/index = 4.5/2.4 ?PVR = 4.0 ?Ao sat = 97% ?PA sat = 57%, 53% ?  ?Assessment: ?1. Normal coronary arteries ?2. Mild pulmonary HTN ?3. Relatively well compensated left-sided filling pressures ?4. Mild to moderately reduced cardiac output ?5. Well functioning MV leaflets on fluoro ?  ?Plan/Discussion:  ?  ?Medical therapy. ?  ?Echocardiogram 03/21/21 ? 1. Left ventricular ejection fraction, by estimation, is 35 to 40%. The  ?left ventricle has moderately  decreased function. The left ventricle  ?demonstrates global hypokinesis. There is moderate left ventricular  ?hypertrophy. Left ventricular diastolic  ?function could not be evaluated.  ? 2. Right ventricular

## 2021-05-07 ENCOUNTER — Encounter: Payer: Self-pay | Admitting: Family Medicine

## 2021-05-08 DIAGNOSIS — I482 Chronic atrial fibrillation, unspecified: Secondary | ICD-10-CM | POA: Diagnosis not present

## 2021-05-08 DIAGNOSIS — R339 Retention of urine, unspecified: Secondary | ICD-10-CM | POA: Diagnosis not present

## 2021-05-08 DIAGNOSIS — I4729 Other ventricular tachycardia: Secondary | ICD-10-CM | POA: Diagnosis not present

## 2021-05-08 DIAGNOSIS — I110 Hypertensive heart disease with heart failure: Secondary | ICD-10-CM | POA: Diagnosis not present

## 2021-05-08 DIAGNOSIS — Z7901 Long term (current) use of anticoagulants: Secondary | ICD-10-CM | POA: Diagnosis not present

## 2021-05-08 DIAGNOSIS — Z9181 History of falling: Secondary | ICD-10-CM | POA: Diagnosis not present

## 2021-05-08 DIAGNOSIS — Z960 Presence of urogenital implants: Secondary | ICD-10-CM | POA: Diagnosis not present

## 2021-05-08 DIAGNOSIS — I5042 Chronic combined systolic (congestive) and diastolic (congestive) heart failure: Secondary | ICD-10-CM | POA: Diagnosis not present

## 2021-05-14 ENCOUNTER — Ambulatory Visit (INDEPENDENT_AMBULATORY_CARE_PROVIDER_SITE_OTHER): Payer: Medicare Other | Admitting: Physician Assistant

## 2021-05-14 ENCOUNTER — Telehealth: Payer: Self-pay | Admitting: Physician Assistant

## 2021-05-14 ENCOUNTER — Encounter: Payer: Self-pay | Admitting: Physician Assistant

## 2021-05-14 ENCOUNTER — Ambulatory Visit (INDEPENDENT_AMBULATORY_CARE_PROVIDER_SITE_OTHER): Payer: Medicare Other | Admitting: Pharmacist

## 2021-05-14 VITALS — BP 155/104 | HR 68 | Ht 68.0 in | Wt 166.0 lb

## 2021-05-14 DIAGNOSIS — I4819 Other persistent atrial fibrillation: Secondary | ICD-10-CM | POA: Diagnosis not present

## 2021-05-14 DIAGNOSIS — Z952 Presence of prosthetic heart valve: Secondary | ICD-10-CM

## 2021-05-14 DIAGNOSIS — I5042 Chronic combined systolic (congestive) and diastolic (congestive) heart failure: Secondary | ICD-10-CM | POA: Diagnosis not present

## 2021-05-14 DIAGNOSIS — N183 Chronic kidney disease, stage 3 unspecified: Secondary | ICD-10-CM

## 2021-05-14 DIAGNOSIS — I482 Chronic atrial fibrillation, unspecified: Secondary | ICD-10-CM | POA: Diagnosis not present

## 2021-05-14 DIAGNOSIS — Z5181 Encounter for therapeutic drug level monitoring: Secondary | ICD-10-CM

## 2021-05-14 DIAGNOSIS — Z7189 Other specified counseling: Secondary | ICD-10-CM | POA: Diagnosis not present

## 2021-05-14 DIAGNOSIS — I10 Essential (primary) hypertension: Secondary | ICD-10-CM | POA: Diagnosis not present

## 2021-05-14 LAB — POCT INR: INR: 4.3 — AB (ref 2.0–3.0)

## 2021-05-14 NOTE — Telephone Encounter (Signed)
Pt c/o BP issue: STAT if pt c/o blurred vision, one-sided weakness or slurred speech ? ?1. What are your last 5 BP readings?  ?05/06/21: 42/87 HR 88 Temp: 97.6 wt 156.7 ? ?2. Are you having any other symptoms (ex. Dizziness, headache, blurred vision, passed out)? no ? ?3. What is your BP issue?  Patient was told at her office visit today to call to report the BP readings taken by her Home Health Nurse ?

## 2021-05-14 NOTE — Patient Instructions (Addendum)
Medication Instructions:  ?Your physician recommends that you continue on your current medications as directed. Please refer to the Current Medication list given to you today. ? ?*If you need a refill on your cardiac medications before your next appointment, please call your pharmacy* ? ? ?Lab Work: ?Bmp- today  ? ?If you have labs (blood work) drawn today and your tests are completely normal, you will receive your results only by: ?MyChart Message (if you have MyChart) OR ?A paper copy in the mail ?If you have any lab test that is abnormal or we need to change your treatment, we will call you to review the results. ? ? ?Testing/Procedures: ?Your physician has referred you to [facility]  ? ? ?Follow-Up: ?At [facility], you and your health needs are our priority.  As part of our continuing mission to provide you with exceptional heart care, we have created designated Provider Care Teams.  These Care Teams include your primary Cardiologist (physician) and Advanced Practice Providers (APPs -  Physician Assistants and Nurse Practitioners) who all work together to provide you with the care you need, when you need it. ? ?We recommend signing up for the patient portal called "MyChart".  Sign up information is provided on this After Visit Summary.  MyChart is used to connect with patients for Virtual Visits (Telemedicine).  Patients are able to view lab/test results, encounter notes, upcoming appointments, etc.  Non-urgent messages can be sent to your provider as well.   ?To learn more about what you can do with MyChart, go to [email or web address].   ? ?Your next appointment:   ?3 month(s) ? ?The format for your next appointment:   ?In Person ? ?Provider:   ?Lewayne Bunting, MD   ? ? ?Other Instructions ? ? ?Important Information About Sugar ? ? ? ? ?  ?

## 2021-05-14 NOTE — Patient Instructions (Signed)
Start taking 1/2 tablet daily except for 1 tablet on Fridays.  Recheck in 2 weeks. Be consistent with your 3 serving of greens per week and your 2 ensures a week. ?[facility] [phone].  ?

## 2021-05-14 NOTE — Telephone Encounter (Signed)
Spent over 23 minutes on the phone with pt.  ?Reports HHRN blood pressure taken on 4/17 was 142/87, she is not sure if this was before or after her medications. ?She does not have a BP a home that currently works. ?She is going to discuss borrowing her dtrs for next little bit. ? ?Advised I will discuss her  Amiodarone administration time with Herma Carson tomorrow. ?Patient verbalized understanding and agreeable to plan.  ? ?

## 2021-05-15 ENCOUNTER — Telehealth: Payer: Self-pay

## 2021-05-15 DIAGNOSIS — I110 Hypertensive heart disease with heart failure: Secondary | ICD-10-CM | POA: Diagnosis not present

## 2021-05-15 DIAGNOSIS — I482 Chronic atrial fibrillation, unspecified: Secondary | ICD-10-CM | POA: Diagnosis not present

## 2021-05-15 DIAGNOSIS — R339 Retention of urine, unspecified: Secondary | ICD-10-CM | POA: Diagnosis not present

## 2021-05-15 DIAGNOSIS — Z9181 History of falling: Secondary | ICD-10-CM | POA: Diagnosis not present

## 2021-05-15 DIAGNOSIS — I4729 Other ventricular tachycardia: Secondary | ICD-10-CM | POA: Diagnosis not present

## 2021-05-15 DIAGNOSIS — Z960 Presence of urogenital implants: Secondary | ICD-10-CM | POA: Diagnosis not present

## 2021-05-15 DIAGNOSIS — Z7901 Long term (current) use of anticoagulants: Secondary | ICD-10-CM | POA: Diagnosis not present

## 2021-05-15 DIAGNOSIS — I5042 Chronic combined systolic (congestive) and diastolic (congestive) heart failure: Secondary | ICD-10-CM | POA: Diagnosis not present

## 2021-05-15 NOTE — Telephone Encounter (Signed)
[name] nurse calls nurse line requesting verbal orders to continue [facility] skilled nursing.  ? ?1x a week for 4 weeks  ? ?Verbal order given.  ?

## 2021-05-16 NOTE — Telephone Encounter (Signed)
Pt advised to take daily BP, keep track, and report numbers in next couple weeks-month, per the PA. ?Advised she may take her Amiodarone in the morning with her other medications if she so wishes, per the PA. ?Patient verbalized understanding and agreeable to plan.  ? ?

## 2021-05-22 DIAGNOSIS — I5042 Chronic combined systolic (congestive) and diastolic (congestive) heart failure: Secondary | ICD-10-CM | POA: Diagnosis not present

## 2021-05-22 DIAGNOSIS — Z960 Presence of urogenital implants: Secondary | ICD-10-CM | POA: Diagnosis not present

## 2021-05-22 DIAGNOSIS — I110 Hypertensive heart disease with heart failure: Secondary | ICD-10-CM | POA: Diagnosis not present

## 2021-05-22 DIAGNOSIS — I482 Chronic atrial fibrillation, unspecified: Secondary | ICD-10-CM | POA: Diagnosis not present

## 2021-05-22 DIAGNOSIS — I4729 Other ventricular tachycardia: Secondary | ICD-10-CM | POA: Diagnosis not present

## 2021-05-22 DIAGNOSIS — Z7901 Long term (current) use of anticoagulants: Secondary | ICD-10-CM | POA: Diagnosis not present

## 2021-05-22 DIAGNOSIS — Z9181 History of falling: Secondary | ICD-10-CM | POA: Diagnosis not present

## 2021-05-22 DIAGNOSIS — R339 Retention of urine, unspecified: Secondary | ICD-10-CM | POA: Diagnosis not present

## 2021-05-23 DIAGNOSIS — Z9181 History of falling: Secondary | ICD-10-CM | POA: Diagnosis not present

## 2021-05-23 DIAGNOSIS — I4729 Other ventricular tachycardia: Secondary | ICD-10-CM | POA: Diagnosis not present

## 2021-05-23 DIAGNOSIS — I5042 Chronic combined systolic (congestive) and diastolic (congestive) heart failure: Secondary | ICD-10-CM | POA: Diagnosis not present

## 2021-05-23 DIAGNOSIS — I110 Hypertensive heart disease with heart failure: Secondary | ICD-10-CM | POA: Diagnosis not present

## 2021-05-23 DIAGNOSIS — Z7901 Long term (current) use of anticoagulants: Secondary | ICD-10-CM | POA: Diagnosis not present

## 2021-05-23 DIAGNOSIS — R339 Retention of urine, unspecified: Secondary | ICD-10-CM | POA: Diagnosis not present

## 2021-05-23 DIAGNOSIS — I482 Chronic atrial fibrillation, unspecified: Secondary | ICD-10-CM | POA: Diagnosis not present

## 2021-05-23 DIAGNOSIS — Z960 Presence of urogenital implants: Secondary | ICD-10-CM | POA: Diagnosis not present

## 2021-05-30 DIAGNOSIS — R339 Retention of urine, unspecified: Secondary | ICD-10-CM | POA: Diagnosis not present

## 2021-05-30 DIAGNOSIS — Z9181 History of falling: Secondary | ICD-10-CM | POA: Diagnosis not present

## 2021-05-30 DIAGNOSIS — Z7901 Long term (current) use of anticoagulants: Secondary | ICD-10-CM | POA: Diagnosis not present

## 2021-05-30 DIAGNOSIS — I110 Hypertensive heart disease with heart failure: Secondary | ICD-10-CM | POA: Diagnosis not present

## 2021-05-30 DIAGNOSIS — I4729 Other ventricular tachycardia: Secondary | ICD-10-CM | POA: Diagnosis not present

## 2021-05-30 DIAGNOSIS — I482 Chronic atrial fibrillation, unspecified: Secondary | ICD-10-CM | POA: Diagnosis not present

## 2021-05-30 DIAGNOSIS — Z960 Presence of urogenital implants: Secondary | ICD-10-CM | POA: Diagnosis not present

## 2021-05-30 DIAGNOSIS — I5042 Chronic combined systolic (congestive) and diastolic (congestive) heart failure: Secondary | ICD-10-CM | POA: Diagnosis not present

## 2021-05-31 DIAGNOSIS — Z9181 History of falling: Secondary | ICD-10-CM | POA: Diagnosis not present

## 2021-05-31 DIAGNOSIS — Z7901 Long term (current) use of anticoagulants: Secondary | ICD-10-CM | POA: Diagnosis not present

## 2021-05-31 DIAGNOSIS — I110 Hypertensive heart disease with heart failure: Secondary | ICD-10-CM | POA: Diagnosis not present

## 2021-05-31 DIAGNOSIS — I482 Chronic atrial fibrillation, unspecified: Secondary | ICD-10-CM | POA: Diagnosis not present

## 2021-05-31 DIAGNOSIS — Z960 Presence of urogenital implants: Secondary | ICD-10-CM | POA: Diagnosis not present

## 2021-05-31 DIAGNOSIS — I4729 Other ventricular tachycardia: Secondary | ICD-10-CM | POA: Diagnosis not present

## 2021-05-31 DIAGNOSIS — R339 Retention of urine, unspecified: Secondary | ICD-10-CM | POA: Diagnosis not present

## 2021-05-31 DIAGNOSIS — I5042 Chronic combined systolic (congestive) and diastolic (congestive) heart failure: Secondary | ICD-10-CM | POA: Diagnosis not present

## 2021-06-03 ENCOUNTER — Ambulatory Visit (INDEPENDENT_AMBULATORY_CARE_PROVIDER_SITE_OTHER): Payer: Medicare Other | Admitting: *Deleted

## 2021-06-03 DIAGNOSIS — Z952 Presence of prosthetic heart valve: Secondary | ICD-10-CM | POA: Diagnosis not present

## 2021-06-03 DIAGNOSIS — I482 Chronic atrial fibrillation, unspecified: Secondary | ICD-10-CM

## 2021-06-03 DIAGNOSIS — Z7189 Other specified counseling: Secondary | ICD-10-CM

## 2021-06-03 DIAGNOSIS — Z5181 Encounter for therapeutic drug level monitoring: Secondary | ICD-10-CM

## 2021-06-03 LAB — POCT INR: INR: 3.4 — AB (ref 2.0–3.0)

## 2021-06-03 NOTE — Patient Instructions (Signed)
Description   ?Continue taking 1/2 tablet daily except for 1 tablet on Fridays. Recheck in 2 weeks.  Pt was started on Amiodarone at hospital discharge, loading dosage started 04/03/21. Be consistent with your 3 serving of greens per week and your 4 ensures a week. ?Coumadin Clinic [phone].  ?  ?  ?

## 2021-06-04 DIAGNOSIS — I110 Hypertensive heart disease with heart failure: Secondary | ICD-10-CM | POA: Diagnosis not present

## 2021-06-04 DIAGNOSIS — Z960 Presence of urogenital implants: Secondary | ICD-10-CM | POA: Diagnosis not present

## 2021-06-04 DIAGNOSIS — I4729 Other ventricular tachycardia: Secondary | ICD-10-CM | POA: Diagnosis not present

## 2021-06-04 DIAGNOSIS — I5042 Chronic combined systolic (congestive) and diastolic (congestive) heart failure: Secondary | ICD-10-CM | POA: Diagnosis not present

## 2021-06-04 DIAGNOSIS — Z9181 History of falling: Secondary | ICD-10-CM | POA: Diagnosis not present

## 2021-06-04 DIAGNOSIS — Z7901 Long term (current) use of anticoagulants: Secondary | ICD-10-CM | POA: Diagnosis not present

## 2021-06-04 DIAGNOSIS — R339 Retention of urine, unspecified: Secondary | ICD-10-CM | POA: Diagnosis not present

## 2021-06-04 DIAGNOSIS — I482 Chronic atrial fibrillation, unspecified: Secondary | ICD-10-CM | POA: Diagnosis not present

## 2021-06-06 DIAGNOSIS — R338 Other retention of urine: Secondary | ICD-10-CM | POA: Diagnosis not present

## 2021-06-07 DIAGNOSIS — I5042 Chronic combined systolic (congestive) and diastolic (congestive) heart failure: Secondary | ICD-10-CM | POA: Diagnosis not present

## 2021-06-07 DIAGNOSIS — I110 Hypertensive heart disease with heart failure: Secondary | ICD-10-CM | POA: Diagnosis not present

## 2021-06-07 DIAGNOSIS — Z960 Presence of urogenital implants: Secondary | ICD-10-CM | POA: Diagnosis not present

## 2021-06-07 DIAGNOSIS — R339 Retention of urine, unspecified: Secondary | ICD-10-CM | POA: Diagnosis not present

## 2021-06-07 DIAGNOSIS — Z9181 History of falling: Secondary | ICD-10-CM | POA: Diagnosis not present

## 2021-06-07 DIAGNOSIS — Z7901 Long term (current) use of anticoagulants: Secondary | ICD-10-CM | POA: Diagnosis not present

## 2021-06-07 DIAGNOSIS — I4729 Other ventricular tachycardia: Secondary | ICD-10-CM | POA: Diagnosis not present

## 2021-06-07 DIAGNOSIS — I482 Chronic atrial fibrillation, unspecified: Secondary | ICD-10-CM | POA: Diagnosis not present

## 2021-06-10 DIAGNOSIS — I5042 Chronic combined systolic (congestive) and diastolic (congestive) heart failure: Secondary | ICD-10-CM | POA: Diagnosis not present

## 2021-06-10 DIAGNOSIS — R339 Retention of urine, unspecified: Secondary | ICD-10-CM | POA: Diagnosis not present

## 2021-06-10 DIAGNOSIS — Z7901 Long term (current) use of anticoagulants: Secondary | ICD-10-CM | POA: Diagnosis not present

## 2021-06-10 DIAGNOSIS — I4729 Other ventricular tachycardia: Secondary | ICD-10-CM | POA: Diagnosis not present

## 2021-06-10 DIAGNOSIS — I110 Hypertensive heart disease with heart failure: Secondary | ICD-10-CM | POA: Diagnosis not present

## 2021-06-10 DIAGNOSIS — Z960 Presence of urogenital implants: Secondary | ICD-10-CM | POA: Diagnosis not present

## 2021-06-10 DIAGNOSIS — Z9181 History of falling: Secondary | ICD-10-CM | POA: Diagnosis not present

## 2021-06-10 DIAGNOSIS — I482 Chronic atrial fibrillation, unspecified: Secondary | ICD-10-CM | POA: Diagnosis not present

## 2021-06-11 DIAGNOSIS — N183 Chronic kidney disease, stage 3 unspecified: Secondary | ICD-10-CM | POA: Diagnosis not present

## 2021-06-11 DIAGNOSIS — R339 Retention of urine, unspecified: Secondary | ICD-10-CM | POA: Diagnosis not present

## 2021-06-11 DIAGNOSIS — I509 Heart failure, unspecified: Secondary | ICD-10-CM | POA: Diagnosis not present

## 2021-06-12 DIAGNOSIS — I4729 Other ventricular tachycardia: Secondary | ICD-10-CM | POA: Diagnosis not present

## 2021-06-12 DIAGNOSIS — I110 Hypertensive heart disease with heart failure: Secondary | ICD-10-CM | POA: Diagnosis not present

## 2021-06-12 DIAGNOSIS — R339 Retention of urine, unspecified: Secondary | ICD-10-CM | POA: Diagnosis not present

## 2021-06-12 DIAGNOSIS — Z960 Presence of urogenital implants: Secondary | ICD-10-CM | POA: Diagnosis not present

## 2021-06-12 DIAGNOSIS — Z7901 Long term (current) use of anticoagulants: Secondary | ICD-10-CM | POA: Diagnosis not present

## 2021-06-12 DIAGNOSIS — I482 Chronic atrial fibrillation, unspecified: Secondary | ICD-10-CM | POA: Diagnosis not present

## 2021-06-12 DIAGNOSIS — Z9181 History of falling: Secondary | ICD-10-CM | POA: Diagnosis not present

## 2021-06-12 DIAGNOSIS — I5042 Chronic combined systolic (congestive) and diastolic (congestive) heart failure: Secondary | ICD-10-CM | POA: Diagnosis not present

## 2021-06-13 DIAGNOSIS — R338 Other retention of urine: Secondary | ICD-10-CM | POA: Diagnosis not present

## 2021-06-22 DIAGNOSIS — Z960 Presence of urogenital implants: Secondary | ICD-10-CM | POA: Diagnosis not present

## 2021-06-22 DIAGNOSIS — I5042 Chronic combined systolic (congestive) and diastolic (congestive) heart failure: Secondary | ICD-10-CM | POA: Diagnosis not present

## 2021-06-22 DIAGNOSIS — Z9181 History of falling: Secondary | ICD-10-CM | POA: Diagnosis not present

## 2021-06-22 DIAGNOSIS — I4729 Other ventricular tachycardia: Secondary | ICD-10-CM | POA: Diagnosis not present

## 2021-06-22 DIAGNOSIS — I482 Chronic atrial fibrillation, unspecified: Secondary | ICD-10-CM | POA: Diagnosis not present

## 2021-06-22 DIAGNOSIS — I110 Hypertensive heart disease with heart failure: Secondary | ICD-10-CM | POA: Diagnosis not present

## 2021-06-22 DIAGNOSIS — Z7901 Long term (current) use of anticoagulants: Secondary | ICD-10-CM | POA: Diagnosis not present

## 2021-06-22 DIAGNOSIS — R339 Retention of urine, unspecified: Secondary | ICD-10-CM | POA: Diagnosis not present

## 2021-06-24 ENCOUNTER — Telehealth: Payer: Self-pay | Admitting: Family Medicine

## 2021-06-24 NOTE — Telephone Encounter (Signed)
Patient's social worker came in stating that the patient was referred to [facility]. They did the assessment in her, but she was denied because they didn't have all the completed paperwork needed. She would like the referral to be resubmitted if possible. If someone can please reach out to patient at [phone], or her social worker Fayne Norrie at [phone].

## 2021-06-26 ENCOUNTER — Ambulatory Visit (INDEPENDENT_AMBULATORY_CARE_PROVIDER_SITE_OTHER): Payer: Medicare Other

## 2021-06-26 DIAGNOSIS — R339 Retention of urine, unspecified: Secondary | ICD-10-CM | POA: Diagnosis not present

## 2021-06-26 DIAGNOSIS — Z952 Presence of prosthetic heart valve: Secondary | ICD-10-CM | POA: Diagnosis not present

## 2021-06-26 DIAGNOSIS — I482 Chronic atrial fibrillation, unspecified: Secondary | ICD-10-CM

## 2021-06-26 DIAGNOSIS — Z5181 Encounter for therapeutic drug level monitoring: Secondary | ICD-10-CM

## 2021-06-26 DIAGNOSIS — Z9181 History of falling: Secondary | ICD-10-CM | POA: Diagnosis not present

## 2021-06-26 DIAGNOSIS — Z960 Presence of urogenital implants: Secondary | ICD-10-CM | POA: Diagnosis not present

## 2021-06-26 DIAGNOSIS — I5042 Chronic combined systolic (congestive) and diastolic (congestive) heart failure: Secondary | ICD-10-CM | POA: Diagnosis not present

## 2021-06-26 DIAGNOSIS — Z7901 Long term (current) use of anticoagulants: Secondary | ICD-10-CM | POA: Diagnosis not present

## 2021-06-26 DIAGNOSIS — I110 Hypertensive heart disease with heart failure: Secondary | ICD-10-CM | POA: Diagnosis not present

## 2021-06-26 DIAGNOSIS — I4729 Other ventricular tachycardia: Secondary | ICD-10-CM | POA: Diagnosis not present

## 2021-06-26 LAB — PROTIME-INR
INR: 9.2 (ref 0.9–1.2)
Prothrombin Time: 82.9 s — ABNORMAL HIGH (ref 9.1–12.0)

## 2021-06-26 NOTE — Patient Instructions (Signed)
Description   0912 POC INR >8. Pt sent to lab and instructed to HOLD Warfarin until labs are resulted.  1111 Lab INR 9.2. Called pt and instructed to hold today, tomorrow, Friday, and Saturday and then resume taking 1/2 tablet daily except for 1 tablet on Fridays.  Recheck INR on Monday 07/01/21    Pt was started on Amiodarone at hospital discharge, loading dosage started 04/03/21.  Be consistent with your 3 serving of greens per week and your 4 ensures a week. Coumadin Clinic [phone].

## 2021-06-27 ENCOUNTER — Ambulatory Visit: Payer: Self-pay

## 2021-06-27 DIAGNOSIS — R338 Other retention of urine: Secondary | ICD-10-CM | POA: Diagnosis not present

## 2021-06-27 NOTE — Chronic Care Management (AMB) (Signed)
   RNCM Care Management Note 06/27/2021 Name: [name] MRN: [id number] DOB: 1935/02/05   [name] is a 86 y.o. year old female who sees Nestor Ramp, MD for primary care. RNCM was consulted by Dr. Jennette Kettle to assistance patient with  Care Coordination.     Collaboration with [facility]  for  [facility] paperwork .  Summary: A message was sent to [facility]  by Dr. Jennette Kettle that [facility] denied the patient's PCS paperwork. I spoke with the social worker, Fayne Norrie, who brought this to our attention but needed clarification on what [facility] required to help the patient. I called [facility] and spoke with Victorino Dike, who informed me that the paperwork was filled out incorrectly but could be fixed and refaxed for the next steps. I then told April Zimmerman Rumple, CMA at [facility], about the situation..   Intervention: The patient may benefit from having her PCS paper corrected and re faxed to [facility] care.  Follow up Plan: If further intervention is needed the care management team is available to follow up after a formal CCM referral is placed  and No follow up scheduled with CCM team at this time     SDOH (Social Determinants of Health) screening and interventions performed today: No         No Care Plan  Established during this encounter     [name] RN, BSN, [facility] Care Management Coordinator [facility] Phone: [phone]

## 2021-06-27 NOTE — Telephone Encounter (Signed)
I am going to involve CCm as I do not know anything aboyut the paperwork and I have nothing in my box. See is Ms [name] can work her magic?!! Thanks Huntley Dec

## 2021-07-01 ENCOUNTER — Ambulatory Visit (INDEPENDENT_AMBULATORY_CARE_PROVIDER_SITE_OTHER): Payer: Medicare Other

## 2021-07-01 DIAGNOSIS — Z952 Presence of prosthetic heart valve: Secondary | ICD-10-CM | POA: Diagnosis not present

## 2021-07-01 DIAGNOSIS — I482 Chronic atrial fibrillation, unspecified: Secondary | ICD-10-CM | POA: Diagnosis not present

## 2021-07-01 DIAGNOSIS — Z5181 Encounter for therapeutic drug level monitoring: Secondary | ICD-10-CM | POA: Diagnosis not present

## 2021-07-01 LAB — POCT INR: INR: 3.5 — AB (ref 2.0–3.0)

## 2021-07-01 NOTE — Patient Instructions (Signed)
Description   START taking 1/2 tablet daily  Recheck INR in 1 week   Pt was started on Amiodarone at hospital discharge, loading dosage started 04/03/21.  Be consistent with your 3 serving of greens per week and your 2 ensures a week. Coumadin Clinic [phone].

## 2021-07-02 ENCOUNTER — Other Ambulatory Visit: Payer: Self-pay | Admitting: Gastroenterology

## 2021-07-02 DIAGNOSIS — K7460 Unspecified cirrhosis of liver: Secondary | ICD-10-CM

## 2021-07-03 DIAGNOSIS — R338 Other retention of urine: Secondary | ICD-10-CM | POA: Diagnosis not present

## 2021-07-04 DIAGNOSIS — I110 Hypertensive heart disease with heart failure: Secondary | ICD-10-CM | POA: Diagnosis not present

## 2021-07-04 DIAGNOSIS — Z9181 History of falling: Secondary | ICD-10-CM | POA: Diagnosis not present

## 2021-07-04 DIAGNOSIS — I482 Chronic atrial fibrillation, unspecified: Secondary | ICD-10-CM | POA: Diagnosis not present

## 2021-07-04 DIAGNOSIS — R339 Retention of urine, unspecified: Secondary | ICD-10-CM | POA: Diagnosis not present

## 2021-07-04 DIAGNOSIS — I4729 Other ventricular tachycardia: Secondary | ICD-10-CM | POA: Diagnosis not present

## 2021-07-04 DIAGNOSIS — I5042 Chronic combined systolic (congestive) and diastolic (congestive) heart failure: Secondary | ICD-10-CM | POA: Diagnosis not present

## 2021-07-04 DIAGNOSIS — Z7901 Long term (current) use of anticoagulants: Secondary | ICD-10-CM | POA: Diagnosis not present

## 2021-07-04 DIAGNOSIS — Z960 Presence of urogenital implants: Secondary | ICD-10-CM | POA: Diagnosis not present

## 2021-07-08 ENCOUNTER — Ambulatory Visit (INDEPENDENT_AMBULATORY_CARE_PROVIDER_SITE_OTHER): Payer: Medicare Other | Admitting: *Deleted

## 2021-07-08 DIAGNOSIS — Z5181 Encounter for therapeutic drug level monitoring: Secondary | ICD-10-CM

## 2021-07-08 DIAGNOSIS — Z952 Presence of prosthetic heart valve: Secondary | ICD-10-CM

## 2021-07-08 DIAGNOSIS — I482 Chronic atrial fibrillation, unspecified: Secondary | ICD-10-CM | POA: Diagnosis not present

## 2021-07-08 LAB — POCT INR: INR: 3 (ref 2.0–3.0)

## 2021-07-08 NOTE — Patient Instructions (Addendum)
Description   Continue taking Warfarin 1/2 tablet daily. Recheck INR in 2 weeks. Be consistent with your 3 serving of greens per week and your 2 ensures a week. Coumadin Clinic [phone] or [phone].   Pt was started on Amiodarone at hospital discharge, loading dosage started 04/03/21.

## 2021-07-10 ENCOUNTER — Ambulatory Visit
Admission: RE | Admit: 2021-07-10 | Discharge: 2021-07-10 | Disposition: A | Discharge status: Home / Self Care | Payer: Medicare Other | Source: Ambulatory Visit | Attending: Gastroenterology | Admitting: Gastroenterology

## 2021-07-10 DIAGNOSIS — K7460 Unspecified cirrhosis of liver: Secondary | ICD-10-CM

## 2021-07-12 DIAGNOSIS — I482 Chronic atrial fibrillation, unspecified: Secondary | ICD-10-CM | POA: Diagnosis not present

## 2021-07-12 DIAGNOSIS — Z960 Presence of urogenital implants: Secondary | ICD-10-CM | POA: Diagnosis not present

## 2021-07-12 DIAGNOSIS — Z7901 Long term (current) use of anticoagulants: Secondary | ICD-10-CM | POA: Diagnosis not present

## 2021-07-12 DIAGNOSIS — I5042 Chronic combined systolic (congestive) and diastolic (congestive) heart failure: Secondary | ICD-10-CM | POA: Diagnosis not present

## 2021-07-12 DIAGNOSIS — Z9181 History of falling: Secondary | ICD-10-CM | POA: Diagnosis not present

## 2021-07-12 DIAGNOSIS — I4729 Other ventricular tachycardia: Secondary | ICD-10-CM | POA: Diagnosis not present

## 2021-07-12 DIAGNOSIS — R339 Retention of urine, unspecified: Secondary | ICD-10-CM | POA: Diagnosis not present

## 2021-07-12 DIAGNOSIS — I110 Hypertensive heart disease with heart failure: Secondary | ICD-10-CM | POA: Diagnosis not present

## 2021-07-17 ENCOUNTER — Ambulatory Visit: Payer: Medicare Other | Admitting: Podiatry

## 2021-07-18 DIAGNOSIS — H401133 Primary open-angle glaucoma, bilateral, severe stage: Secondary | ICD-10-CM | POA: Diagnosis not present

## 2021-07-19 DIAGNOSIS — I4729 Other ventricular tachycardia: Secondary | ICD-10-CM | POA: Diagnosis not present

## 2021-07-19 DIAGNOSIS — R339 Retention of urine, unspecified: Secondary | ICD-10-CM | POA: Diagnosis not present

## 2021-07-19 DIAGNOSIS — Z960 Presence of urogenital implants: Secondary | ICD-10-CM | POA: Diagnosis not present

## 2021-07-19 DIAGNOSIS — I110 Hypertensive heart disease with heart failure: Secondary | ICD-10-CM | POA: Diagnosis not present

## 2021-07-19 DIAGNOSIS — Z7901 Long term (current) use of anticoagulants: Secondary | ICD-10-CM | POA: Diagnosis not present

## 2021-07-19 DIAGNOSIS — Z9181 History of falling: Secondary | ICD-10-CM | POA: Diagnosis not present

## 2021-07-19 DIAGNOSIS — I5042 Chronic combined systolic (congestive) and diastolic (congestive) heart failure: Secondary | ICD-10-CM | POA: Diagnosis not present

## 2021-07-19 DIAGNOSIS — I482 Chronic atrial fibrillation, unspecified: Secondary | ICD-10-CM | POA: Diagnosis not present

## 2021-07-24 ENCOUNTER — Ambulatory Visit: Payer: Medicare Other | Admitting: Podiatry

## 2021-07-25 ENCOUNTER — Ambulatory Visit (INDEPENDENT_AMBULATORY_CARE_PROVIDER_SITE_OTHER): Payer: Medicare Other

## 2021-07-25 DIAGNOSIS — Z5181 Encounter for therapeutic drug level monitoring: Secondary | ICD-10-CM | POA: Diagnosis not present

## 2021-07-25 DIAGNOSIS — I482 Chronic atrial fibrillation, unspecified: Secondary | ICD-10-CM | POA: Diagnosis not present

## 2021-07-25 DIAGNOSIS — Z952 Presence of prosthetic heart valve: Secondary | ICD-10-CM | POA: Diagnosis not present

## 2021-07-25 LAB — POCT INR: INR: 3.6 — AB (ref 2.0–3.0)

## 2021-07-25 NOTE — Patient Instructions (Signed)
Continue taking Warfarin 1/2 tablet daily. Recheck INR in 4 weeks. Be consistent with your 3 serving of greens per week and your 2 ensures a week. Coumadin Clinic [phone] or [phone]. Eat greens tonight.  Pt was started on Amiodarone at hospital discharge, loading dosage started 04/03/21.

## 2021-07-26 DIAGNOSIS — Z9181 History of falling: Secondary | ICD-10-CM | POA: Diagnosis not present

## 2021-07-26 DIAGNOSIS — I482 Chronic atrial fibrillation, unspecified: Secondary | ICD-10-CM | POA: Diagnosis not present

## 2021-07-26 DIAGNOSIS — Z7901 Long term (current) use of anticoagulants: Secondary | ICD-10-CM | POA: Diagnosis not present

## 2021-07-26 DIAGNOSIS — I4729 Other ventricular tachycardia: Secondary | ICD-10-CM | POA: Diagnosis not present

## 2021-07-26 DIAGNOSIS — R339 Retention of urine, unspecified: Secondary | ICD-10-CM | POA: Diagnosis not present

## 2021-07-26 DIAGNOSIS — I110 Hypertensive heart disease with heart failure: Secondary | ICD-10-CM | POA: Diagnosis not present

## 2021-07-26 DIAGNOSIS — Z960 Presence of urogenital implants: Secondary | ICD-10-CM | POA: Diagnosis not present

## 2021-07-26 DIAGNOSIS — I5042 Chronic combined systolic (congestive) and diastolic (congestive) heart failure: Secondary | ICD-10-CM | POA: Diagnosis not present

## 2021-07-30 ENCOUNTER — Ambulatory Visit: Payer: Medicare Other | Admitting: Internal Medicine

## 2021-07-31 DIAGNOSIS — I482 Chronic atrial fibrillation, unspecified: Secondary | ICD-10-CM | POA: Diagnosis not present

## 2021-07-31 DIAGNOSIS — Z9181 History of falling: Secondary | ICD-10-CM | POA: Diagnosis not present

## 2021-07-31 DIAGNOSIS — I110 Hypertensive heart disease with heart failure: Secondary | ICD-10-CM | POA: Diagnosis not present

## 2021-07-31 DIAGNOSIS — R339 Retention of urine, unspecified: Secondary | ICD-10-CM | POA: Diagnosis not present

## 2021-07-31 DIAGNOSIS — Z960 Presence of urogenital implants: Secondary | ICD-10-CM | POA: Diagnosis not present

## 2021-07-31 DIAGNOSIS — I4729 Other ventricular tachycardia: Secondary | ICD-10-CM | POA: Diagnosis not present

## 2021-07-31 DIAGNOSIS — I5042 Chronic combined systolic (congestive) and diastolic (congestive) heart failure: Secondary | ICD-10-CM | POA: Diagnosis not present

## 2021-07-31 DIAGNOSIS — Z7901 Long term (current) use of anticoagulants: Secondary | ICD-10-CM | POA: Diagnosis not present

## 2021-08-10 DIAGNOSIS — R339 Retention of urine, unspecified: Secondary | ICD-10-CM | POA: Diagnosis not present

## 2021-08-10 DIAGNOSIS — Z960 Presence of urogenital implants: Secondary | ICD-10-CM | POA: Diagnosis not present

## 2021-08-10 DIAGNOSIS — I482 Chronic atrial fibrillation, unspecified: Secondary | ICD-10-CM | POA: Diagnosis not present

## 2021-08-10 DIAGNOSIS — I110 Hypertensive heart disease with heart failure: Secondary | ICD-10-CM | POA: Diagnosis not present

## 2021-08-10 DIAGNOSIS — Z7901 Long term (current) use of anticoagulants: Secondary | ICD-10-CM | POA: Diagnosis not present

## 2021-08-10 DIAGNOSIS — I5042 Chronic combined systolic (congestive) and diastolic (congestive) heart failure: Secondary | ICD-10-CM | POA: Diagnosis not present

## 2021-08-10 DIAGNOSIS — I4729 Other ventricular tachycardia: Secondary | ICD-10-CM | POA: Diagnosis not present

## 2021-08-10 DIAGNOSIS — Z9181 History of falling: Secondary | ICD-10-CM | POA: Diagnosis not present

## 2021-08-12 DIAGNOSIS — I482 Chronic atrial fibrillation, unspecified: Secondary | ICD-10-CM | POA: Diagnosis not present

## 2021-08-12 DIAGNOSIS — Z960 Presence of urogenital implants: Secondary | ICD-10-CM | POA: Diagnosis not present

## 2021-08-12 DIAGNOSIS — R339 Retention of urine, unspecified: Secondary | ICD-10-CM | POA: Diagnosis not present

## 2021-08-12 DIAGNOSIS — I4729 Other ventricular tachycardia: Secondary | ICD-10-CM | POA: Diagnosis not present

## 2021-08-12 DIAGNOSIS — Z9181 History of falling: Secondary | ICD-10-CM | POA: Diagnosis not present

## 2021-08-12 DIAGNOSIS — I5042 Chronic combined systolic (congestive) and diastolic (congestive) heart failure: Secondary | ICD-10-CM | POA: Diagnosis not present

## 2021-08-12 DIAGNOSIS — Z7901 Long term (current) use of anticoagulants: Secondary | ICD-10-CM | POA: Diagnosis not present

## 2021-08-12 DIAGNOSIS — I110 Hypertensive heart disease with heart failure: Secondary | ICD-10-CM | POA: Diagnosis not present

## 2021-08-14 ENCOUNTER — Ambulatory Visit: Payer: Medicare Other | Admitting: Family Medicine

## 2021-08-18 DIAGNOSIS — Z960 Presence of urogenital implants: Secondary | ICD-10-CM | POA: Diagnosis not present

## 2021-08-18 DIAGNOSIS — Z9181 History of falling: Secondary | ICD-10-CM | POA: Diagnosis not present

## 2021-08-18 DIAGNOSIS — I4729 Other ventricular tachycardia: Secondary | ICD-10-CM | POA: Diagnosis not present

## 2021-08-18 DIAGNOSIS — Z7901 Long term (current) use of anticoagulants: Secondary | ICD-10-CM | POA: Diagnosis not present

## 2021-08-18 DIAGNOSIS — I110 Hypertensive heart disease with heart failure: Secondary | ICD-10-CM | POA: Diagnosis not present

## 2021-08-18 DIAGNOSIS — I5042 Chronic combined systolic (congestive) and diastolic (congestive) heart failure: Secondary | ICD-10-CM | POA: Diagnosis not present

## 2021-08-18 DIAGNOSIS — I482 Chronic atrial fibrillation, unspecified: Secondary | ICD-10-CM | POA: Diagnosis not present

## 2021-08-18 DIAGNOSIS — R339 Retention of urine, unspecified: Secondary | ICD-10-CM | POA: Diagnosis not present

## 2021-08-22 ENCOUNTER — Ambulatory Visit (INDEPENDENT_AMBULATORY_CARE_PROVIDER_SITE_OTHER): Payer: Medicare Other

## 2021-08-22 ENCOUNTER — Other Ambulatory Visit: Payer: Self-pay | Admitting: Internal Medicine

## 2021-08-22 DIAGNOSIS — Z5181 Encounter for therapeutic drug level monitoring: Secondary | ICD-10-CM | POA: Diagnosis not present

## 2021-08-22 DIAGNOSIS — Z952 Presence of prosthetic heart valve: Secondary | ICD-10-CM | POA: Diagnosis not present

## 2021-08-22 DIAGNOSIS — I482 Chronic atrial fibrillation, unspecified: Secondary | ICD-10-CM | POA: Diagnosis not present

## 2021-08-22 LAB — POCT INR: INR: 1.5 — AB (ref 2.0–3.0)

## 2021-08-22 NOTE — Telephone Encounter (Signed)
Prescription refill request received for warfarin Lov: 05/14/21 Geni Bers)  Next INR check: 09/05/21 Warfarin tablet strength: 10mg   Appropriate dose and refill sent to requested pharmacy.

## 2021-08-22 NOTE — Patient Instructions (Addendum)
TAKE 1 TABLET TODAY AND TOMORROW ONLY and then Continue taking Warfarin 1/2 tablet daily.  NO GREENS UNTIL MONDAY 8/7. Recheck INR in 2 weeks. Be consistent with your 3 serving of greens per week and your 2 ensures a week. Coumadin Clinic [phone] or [phone].   Pt was started on Amiodarone at hospital discharge, loading dosage started 04/03/21.

## 2021-09-05 ENCOUNTER — Ambulatory Visit (INDEPENDENT_AMBULATORY_CARE_PROVIDER_SITE_OTHER): Payer: Medicare Other

## 2021-09-05 DIAGNOSIS — I482 Chronic atrial fibrillation, unspecified: Secondary | ICD-10-CM | POA: Diagnosis not present

## 2021-09-05 DIAGNOSIS — Z952 Presence of prosthetic heart valve: Secondary | ICD-10-CM | POA: Diagnosis not present

## 2021-09-05 DIAGNOSIS — Z5181 Encounter for therapeutic drug level monitoring: Secondary | ICD-10-CM | POA: Diagnosis not present

## 2021-09-05 LAB — POCT INR: INR: 2.9 (ref 2.0–3.0)

## 2021-09-05 NOTE — Patient Instructions (Signed)
Continue taking Warfarin 1/2 tablet daily. Recheck INR in 4 weeks. Be consistent with your 3 serving of greens per week and your 2 ensures a week. Coumadin Clinic [phone] or [phone].   Pt was started on Amiodarone at hospital discharge, loading dosage started 04/03/21.

## 2021-09-06 ENCOUNTER — Telehealth: Payer: Self-pay | Admitting: Family Medicine

## 2021-09-06 NOTE — Telephone Encounter (Signed)
Social Worker dropped off form at front desk for [employer].  Verified that patient section of form has been completed.  Last DOS/WCC with PCP was 08/14/21.  Placed form in green team folder to be completed by clinical staff.  Vilinda Blanks

## 2021-09-10 NOTE — Telephone Encounter (Signed)
Clinical info completed on FL2 form.  Placed form in Dr. Donnetta Hail box for completion.    When form is completed, please route note to "RN Team" and place in wall pocket in front office.   Boaz Berisha Zimmerman Rumple, CMA

## 2021-09-17 NOTE — Telephone Encounter (Signed)
Placed in wall pocket

## 2021-09-18 ENCOUNTER — Ambulatory Visit (INDEPENDENT_AMBULATORY_CARE_PROVIDER_SITE_OTHER): Payer: Medicare Other | Admitting: Family Medicine

## 2021-09-18 ENCOUNTER — Encounter: Payer: Self-pay | Admitting: Family Medicine

## 2021-09-18 VITALS — BP 132/80 | HR 74 | Ht 68.0 in | Wt 154.8 lb

## 2021-09-18 DIAGNOSIS — I5042 Chronic combined systolic (congestive) and diastolic (congestive) heart failure: Secondary | ICD-10-CM

## 2021-09-18 DIAGNOSIS — N183 Chronic kidney disease, stage 3 unspecified: Secondary | ICD-10-CM | POA: Diagnosis not present

## 2021-09-18 DIAGNOSIS — I10 Essential (primary) hypertension: Secondary | ICD-10-CM

## 2021-09-18 DIAGNOSIS — Z87898 Personal history of other specified conditions: Secondary | ICD-10-CM

## 2021-09-18 NOTE — Assessment & Plan Note (Signed)
We will check creatinine today.

## 2021-09-18 NOTE — Patient Instructions (Signed)
II will send you a note about your labs. I will plan on seeing you back in 56 months. Please call sooner if you need something. Great to see you!

## 2021-09-18 NOTE — Assessment & Plan Note (Signed)
Currently euvolemic and seems stable.

## 2021-09-18 NOTE — Telephone Encounter (Signed)
Called and LVM with [place], patient's Child psychotherapist regarding completion of paperwork.   It is unclear on the forms if paperwork is to be faxed or picked up.   I will place paperwork at front desk. Copy made and placed in batch scanning.   Asked social worker to return call if paperwork also needs to be faxed.   Veronda Prude, RN

## 2021-09-18 NOTE — Assessment & Plan Note (Signed)
We will check creatinine today.  She is currently urinating without any problems.  She has follow-up with urology group next month.

## 2021-09-18 NOTE — Assessment & Plan Note (Signed)
Initial blood pressure elevated today but after recheck she was in good range.  I think there is some whitecoat hypertension component.  We will continue current medications check some labs today.  Follow-up this in 6 months.

## 2021-09-18 NOTE — Progress Notes (Signed)
    CHIEF COMPLAINT / HPI: #1.  Follow-up hypertension: Been taking medicines regularly.  Her booking pressure today was quite high but she states she is very nervous because she is afraid I may not put her in the hospital like I did last time.  He is feeling quite well however.  Gets her blood pressure checked by home health and it is usually in good range.  No problems with her medicines. 2.  History of chronic kidney disease with acute kidney injury and elevated creatinine requiring indwelling Foley for quite a while.  That was discontinued in June.  She has been urinating on her own without any problems since then. 3.  Chronic atrial fibrillation on chronic anticoagulation.  She follows with the Coumadin clinic.  She has not had any bleeding.  She also has history of a Development worker, international aid valve replacement.   PERTINENT  PMH / PSH: I have reviewed the patient's medications, allergies, past medical and surgical history, smoking status and updated in the EMR as appropriate. Saint Jude mechanical valve for mitral valve replacement  OBJECTIVE:  BP 132/80   Pulse 74   Ht 5\' 8"  (1.727 m)   Wt 154 lb 12.8 oz (70.2 kg)   SpO2 100%   BMI 23.54 kg/m  Vital signs reviewed. GENERAL: Well-developed, well-nourished, no acute distress.  Slightly frail-appearing. CARDIOVASCULAR: Irregularly irregular LUNGS: Clear to auscultation bilaterally, no rales or wheeze. ABDOMEN: Soft positive bowel sounds NEURO: No gross focal neurological deficits. MSK: Movement of extremity x 4.  She is walking with a cane and rises from a chair with minimal assistance from her daughter.   ASSESSMENT / PLAN:   CKD (chronic kidney disease), stage III (HCC) We will check creatinine today.  History of urinary retention We will check creatinine today.  She is currently urinating without any problems.  She has follow-up with urology group next month.  Chronic combined systolic and diastolic congestive heart failure  (HCC) Currently euvolemic and seems stable.  Hypertension Initial blood pressure elevated today but after recheck she was in good range.  I think there is some whitecoat hypertension component.  We will continue current medications check some labs today.  Follow-up this in 6 months.   MD

## 2021-09-19 LAB — BASIC METABOLIC PANEL
BUN/Creatinine Ratio: 18 (ref 12–28)
BUN: 40 mg/dL — ABNORMAL HIGH (ref 8–27)
CO2: 20 mmol/L (ref 20–29)
Calcium: 9.9 mg/dL (ref 8.7–10.3)
Chloride: 106 mmol/L (ref 96–106)
Creatinine, Ser: 2.18 mg/dL — ABNORMAL HIGH (ref 0.57–1.00)
Glucose: 89 mg/dL (ref 70–99)
Potassium: 3.9 mmol/L (ref 3.5–5.2)
Sodium: 140 mmol/L (ref 134–144)
eGFR: 22 mL/min/{1.73_m2} — ABNORMAL LOW (ref >59)

## 2021-09-26 ENCOUNTER — Ambulatory Visit (INDEPENDENT_AMBULATORY_CARE_PROVIDER_SITE_OTHER): Payer: Medicare Other | Admitting: Family Medicine

## 2021-09-26 VITALS — BP 152/81 | HR 65 | Ht 68.0 in | Wt 153.4 lb

## 2021-09-26 DIAGNOSIS — H9201 Otalgia, right ear: Secondary | ICD-10-CM | POA: Diagnosis not present

## 2021-09-26 DIAGNOSIS — H9193 Unspecified hearing loss, bilateral: Secondary | ICD-10-CM | POA: Diagnosis not present

## 2021-09-26 NOTE — Patient Instructions (Addendum)
It was nice seeing you today!  Try exercises such as yawning and chewing gum.  I recommend using a nasal spray like Flonase.  Referral placed for audiology specialist. They will call you for an appointment.  Stay well, Littie Deeds, MD [facility] [phone]  --  Make sure to check out at the front desk before you leave today.  Please arrive at least 15 minutes prior to your scheduled appointments.  If you had blood work today, I will send you a MyChart message or a letter if results are normal. Otherwise, I will give you a call.  If you had a referral placed, they will call you to set up an appointment. Please give [place] a call if you don't hear back in the next 2 weeks.  If you need additional refills before your next appointment, please call your pharmacy first.

## 2021-09-26 NOTE — Progress Notes (Signed)
    SUBJECTIVE:   CHIEF COMPLAINT / HPI:  Chief Complaint  Patient presents with   Ear Pain    Here with daughter.  Episodic right ear pain ongoing for several months. Wears hearing aids in both ears, thinks sometimes she pushes it in too far causing pain. Has pain in her right jaw worse when opening the jaw which is associated with the ear pain. Sometimes yawns with improvement. Denies fever, chills, ear drainage, headaches. Hard of hearing at baseline but no recent change. Has allergies and postnasal drip but not taking any medications.  Needs new hearing aids, wants a referral to audiology. Thinks she needs bigger ones. Last visit to audiology was 3-4 years ago.  PERTINENT  PMH / PSH: HTN, CKD III, A Fib, CHF, mitral valve replacement, right hemicolectomy  Patient Care Team: Nestor Ramp, MD as PCP - General (Family Medicine) Marinus Maw, MD as PCP - Cardiology (Cardiology) Willis Modena, MD as Consulting Physician (Gastroenterology)   OBJECTIVE:   BP (!) 152/81   Pulse 65   Ht 5\' 8"  (1.727 m)   Wt 153 lb 6.4 oz (69.6 kg)   SpO2 100%   BMI 23.32 kg/m   Physical Exam Constitutional:      General: She is not in acute distress. HENT:     Right Ear: Ear canal and external ear normal.     Left Ear: Tympanic membrane, ear canal and external ear normal.     Ears:     Comments: Right TM obstructed by cerumen, non-occluding Cardiovascular:     Rate and Rhythm: Normal rate.  Pulmonary:     Effort: Pulmonary effort is normal. No respiratory distress.  Musculoskeletal:     Cervical back: Neck supple.  Neurological:     Mental Status: She is alert.         09/26/2021    1:35 PM  Depression screen PHQ 2/9  Decreased Interest 0  Down, Depressed, Hopeless 0  PHQ - 2 Score 0  Altered sleeping 0  Tired, decreased energy 0  Change in appetite 0  Feeling bad or failure about yourself  0  Trouble concentrating 0  Moving slowly or fidgety/restless 0  Suicidal thoughts  0  PHQ-9 Score 0  Difficult doing work/chores Not difficult at all     {Show previous vital signs (optional):23777}    ASSESSMENT/PLAN:   Right ear pain Chronic, intermittent. Suspect Eustachian tube dysfunction, could consider TMJ. Low likelihood of otitis/infectious. - ear lavaged per patient request - recommended IN corticosteroids ie. Flonase - Eustachian tube exercises discussed  Bilateral hearing loss Referral to audiology per patient request. May need new hearing aids.  Return if symptoms worsen or fail to improve.   4/9, MD [facility]

## 2021-09-27 ENCOUNTER — Telehealth: Payer: Self-pay

## 2021-10-01 DIAGNOSIS — K7460 Unspecified cirrhosis of liver: Secondary | ICD-10-CM | POA: Diagnosis not present

## 2021-10-01 DIAGNOSIS — R197 Diarrhea, unspecified: Secondary | ICD-10-CM | POA: Diagnosis not present

## 2021-10-03 ENCOUNTER — Ambulatory Visit: Payer: Medicare Other | Attending: Cardiology

## 2021-10-03 DIAGNOSIS — Z952 Presence of prosthetic heart valve: Secondary | ICD-10-CM | POA: Diagnosis not present

## 2021-10-03 DIAGNOSIS — Z5181 Encounter for therapeutic drug level monitoring: Secondary | ICD-10-CM | POA: Diagnosis not present

## 2021-10-03 DIAGNOSIS — I482 Chronic atrial fibrillation, unspecified: Secondary | ICD-10-CM

## 2021-10-03 LAB — POCT INR: INR: 1.6 — AB (ref 2.0–3.0)

## 2021-10-03 NOTE — Patient Instructions (Signed)
TAKE 1.5 TABLETS TODAY ONLY and then Continue taking Warfarin 1/2 tablet daily. Recheck INR in 2 weeks. Be consistent with your 3 serving of greens per week and your 2 ensures a week. Coumadin Clinic [phone] or [phone].   Pt was started on Amiodarone at hospital discharge, loading dosage started 04/03/21.

## 2021-10-04 ENCOUNTER — Encounter: Payer: Self-pay | Admitting: Family Medicine

## 2021-10-07 DIAGNOSIS — N3000 Acute cystitis without hematuria: Secondary | ICD-10-CM | POA: Diagnosis not present

## 2021-10-07 DIAGNOSIS — R338 Other retention of urine: Secondary | ICD-10-CM | POA: Diagnosis not present

## 2021-10-15 DIAGNOSIS — H401133 Primary open-angle glaucoma, bilateral, severe stage: Secondary | ICD-10-CM | POA: Diagnosis not present

## 2021-10-17 ENCOUNTER — Ambulatory Visit: Payer: Medicare Other

## 2021-10-23 ENCOUNTER — Ambulatory Visit: Payer: Medicare Other | Attending: Cardiology | Admitting: *Deleted

## 2021-10-23 ENCOUNTER — Telehealth: Payer: Self-pay | Admitting: Internal Medicine

## 2021-10-23 DIAGNOSIS — Z952 Presence of prosthetic heart valve: Secondary | ICD-10-CM

## 2021-10-23 DIAGNOSIS — Z5181 Encounter for therapeutic drug level monitoring: Secondary | ICD-10-CM

## 2021-10-23 DIAGNOSIS — I4891 Unspecified atrial fibrillation: Secondary | ICD-10-CM

## 2021-10-23 DIAGNOSIS — I482 Chronic atrial fibrillation, unspecified: Secondary | ICD-10-CM

## 2021-10-23 LAB — POCT INR: INR: 3.1 — AB (ref 2.0–3.0)

## 2021-10-23 NOTE — Telephone Encounter (Signed)
Pt c/o medication issue:  1. Name of Medication:   diltiazem (CARDIZEM CD) 240 MG 24 hr capsule  2. How are you currently taking this medication (dosage and times per day)?   As prescribed  3. Are you having a reaction (difficulty breathing--STAT)?   No  4. What is your medication issue?   Patient stated she has been taking this medication and has only a couple of capsules left.  Patient would like to know if she needs to continue taking this medication.

## 2021-10-23 NOTE — Telephone Encounter (Signed)
Pt called back per call to [place] / Triage.   Pt was told that Diltiazem was discontinued on 3 / 2023, and that she should NOT be taking it.  Pt educated and told to take Amiodarone and Toprol XL.  Pt educated on what these medications do, and she is NOT to take any more Diltiazem.    Pt verbalized understanding, but seem confused b/c [facility] on [address] refilled this medication, Per patient.    Pt seemed to understand the explanation / teaching but told to discuss her meds with her PCP at next appointment.

## 2021-10-23 NOTE — Telephone Encounter (Signed)
Left message for patient to call me back. 

## 2021-10-23 NOTE — Telephone Encounter (Signed)
Patient is calling to get refill on Ditiazem....told patient it is not on medication list. Please call back to discuss

## 2021-10-23 NOTE — Patient Instructions (Signed)
Description   Continue taking Warfarin 1/2 tablet daily. Recheck INR in 3 weeks. Be consistent with your 3 serving of greens per week and your 2 ensures a week. Coumadin Clinic [phone] or [phone].   Pt was started on Amiodarone at hospital discharge, loading dosage started 04/03/21.

## 2021-10-25 ENCOUNTER — Other Ambulatory Visit: Payer: Self-pay | Admitting: Family Medicine

## 2021-10-28 ENCOUNTER — Other Ambulatory Visit: Payer: Self-pay | Admitting: *Deleted

## 2021-10-30 ENCOUNTER — Ambulatory Visit: Payer: Medicare Other | Admitting: Family Medicine

## 2021-11-05 ENCOUNTER — Ambulatory Visit (INDEPENDENT_AMBULATORY_CARE_PROVIDER_SITE_OTHER): Payer: Self-pay | Admitting: Podiatry

## 2021-11-05 DIAGNOSIS — Z91199 Patient's noncompliance with other medical treatment and regimen due to unspecified reason: Secondary | ICD-10-CM

## 2021-11-11 NOTE — Progress Notes (Signed)
No show

## 2021-11-12 NOTE — Progress Notes (Deleted)
PCP:  Dickie La, MD Primary Cardiologist: Cristopher Peru, MD Electrophysiologist: Cristopher Peru, MD   [name] is a 86 y.o. female seen today for Cristopher Peru, MD for {VISITTYPE:28148}  Past Medical History:  Diagnosis Date   Asthma    years ago   Chronic atrial fibrillation [facility])    Dilated cardiomyopathy ([place])    history of this, now resolved   Dysphagia    Hx   Fracture of tibial plateau 05/21/2015   GERD (gastroesophageal reflux disease)    GI bleed    Hemorrhoid 04/2014   bleeding   History of blood transfusion    Hypertension    Microcytic anemia    Nonsustained ventricular tachycardia ([place])    Osteoporosis    Protein in urine    RBBB (right bundle branch block)    Rheumatic mitral valve disease    Past Surgical History:  Procedure Laterality Date   CHOLECYSTECTOMY  2005   COLONOSCOPY     COLONOSCOPY W/ POLYPECTOMY     EYE SURGERY  04/2014   HEMICOLECTOMY  2008   forpolyps   MITOMYCIN C APPLICATION Right [id number]   Procedure: MITOMYCIN C APPLICATION;  Surgeon: Marylynn Pearson, MD;  Location: [place];  Service: Ophthalmology;  Laterality: Right;   MITRAL VALVE REPLACEMENT  1987   St Jude mechanical valve   RIGHT/LEFT HEART CATH AND CORONARY ANGIOGRAPHY N/A 04/02/2021   Procedure: RIGHT/LEFT HEART CATH AND CORONARY ANGIOGRAPHY;  Surgeon: Jolaine Artist, MD;  Location: [place] [facility];  Service: Cardiovascular;  Laterality: N/A;   TRABECULECTOMY Right 05/17/2014   Procedure: TRABECULECTOMY WITH MITOMYCIN RIGHT EYE;  Surgeon: Marylynn Pearson, MD;  Location: [place];  Service: Ophthalmology;  Laterality: Right;   TUBAL LIGATION     US ECHOCARDIOGRAPHY  02/2008, 03/2006, 06/2004    Current Outpatient Medications  Medication Sig Dispense Refill   amiodarone (PACERONE) 200 MG tablet Take one by mouth daily 90 tablet 3   Cholecalciferol (VITAMIN D) 2000 units tablet Take 1 tablet (2,000 Units total) by mouth daily. 100 tablet 3   dorzolamidel-timolol (COSOPT)  22.3-6.8 MG/ML SOLN ophthalmic solution Place 1 drop into both eyes in the morning and at bedtime.  4   furosemide (LASIX) 40 MG tablet Take 1 tablet (40 mg total) by mouth daily. 90 tablet 3   hydrALAZINE (APRESOLINE) 25 MG tablet Take one half tablet three times a day with food 95 tablet 3   isosorbide mononitrate (IMDUR) 30 MG 24 hr tablet Take one half tablet by mouth daily 45 tablet 3   LUMIGAN 0.01 % SOLN Place 1 drop into both eyes at bedtime.  0   metoprolol succinate (TOPROL-XL) 100 MG 24 hr tablet TAKE 1 TABLET BY MOUTH DAILY 90 tablet 3   pantoprazole (PROTONIX) 40 MG tablet TAKE 1 TABLET(40 MG) BY MOUTH DAILY 90 tablet 3   polyethylene glycol (MIRALAX / GLYCOLAX) 17 g packet Take 17 g by mouth daily as needed for mild constipation.     tamsulosin (FLOMAX) 0.4 MG CAPS capsule Take 1 capsule (0.4 mg total) by mouth daily. 90 capsule 3   warfarin (COUMADIN) 10 MG tablet TAKE 1/2 TO 1 TABLET BY MOUTH EVERY DAY AS DIRECTED BY COAGULATION CLINIC 55 tablet 1   No current facility-administered medications for this visit.    Allergies  Allergen Reactions   Iodinated Contrast Media Rash    Pt stated in the past had broken out in a rash on back and itching.  Esomeprazole Magnesium     REACTION: stomach upset, nausea    Social History   Socioeconomic History   Marital status: Divorced    Spouse name: Not on file   Number of children: 5   Years of education: 12   Highest education level: High school graduate  Occupational History   Occupation: Retired  Tobacco Use   Smoking status: Former    Packs/day: 1.50    Years: 15.00    Total pack years: 22.50    Types: Cigarettes    Quit date: 1987    Years since quitting: 36.8   Smokeless tobacco: Never   Tobacco comments:    quit 1987  Vaping Use   Vaping Use: Never used  Substance and Sexual Activity   Alcohol use: No   Drug use: No   Sexual activity: Not Currently  Other Topics Concern   Not on file  Social History  Narrative   Patient lives alone in [place].   Patient has 3 children who live near her and offer support. 2 live up [place].   Patient is active in [employer] and enjoys walking for exercise.    Social Determinants of Health   Financial Resource Strain: Low Risk  (10/07/2018)   Overall Financial Resource Strain (CARDIA)    Difficulty of Paying Living Expenses: Not hard at all  Food Insecurity: No Food Insecurity (10/07/2018)   Hunger Vital Sign    Worried About Running Out of Food in the Last Year: Never true    Ran Out of Food in the Last Year: Never true  Transportation Needs: No Transportation Needs (10/07/2018)   PRAPARE - Administrator, Civil ServiceTransportation    Lack of Transportation (Medical): No    Lack of Transportation (Non-Medical): No  Physical Activity: Insufficiently Active (10/07/2018)   Exercise Vital Sign    Days of Exercise per Week: 3 days    Minutes of Exercise per Session: 30 min  Stress: No Stress Concern Present (10/07/2018)   [employer] - Occupational Stress Questionnaire    Feeling of Stress : Not at all  Social Connections: Moderately Isolated (10/07/2018)   Social Connection and Isolation Panel [NHANES]    Frequency of Communication with Friends and Family: Three times a week    Frequency of Social Gatherings with Friends and Family: Three times a week    Attends Religious Services: 1 to 4 times per year    Active Member of Clubs or Organizations: No    Attends BankerClub or Organization Meetings: Never    Marital Status: Divorced  Catering managerntimate Partner Violence: Not At Risk (10/07/2018)   Humiliation, Afraid, Rape, and Kick questionnaire    Fear of Current or Ex-Partner: No    Emotionally Abused: No    Physically Abused: No    Sexually Abused: No     Review of Systems: All other systems reviewed and are otherwise negative except as noted above.  Physical Exam: There were no vitals filed for this visit.  GEN- The patient is well appearing, alert and  oriented x 3 today.   HEENT: normocephalic, atraumatic; sclera clear, conjunctiva pink; hearing intact; oropharynx clear; neck supple, no JVP Lymph- no cervical lymphadenopathy Lungs- Clear to ausculation bilaterally, normal work of breathing.  No wheezes, rales, rhonchi Heart- {Blank single:19197::"Regular","Irregularly irregular"} rate and rhythm, no murmurs, rubs or gallops, PMI not laterally displaced GI- soft, non-tender, non-distended, bowel sounds present, no hepatosplenomegaly Extremities- {EDEMA ZOXWR:60454}LEVEL:28147} peripheral edema. no clubbing or cyanosis; DP/PT/radial pulses 2+ bilaterally MS- no  significant deformity or atrophy Skin- warm and dry, no rash or lesion Psych- euthymic mood, full affect Neuro- strength and sensation are intact  EKG {ACTION; IS/IS RSW:[id number]} ordered. Personal review of EKG from {Blank single:19197::"today","***"} shows ***  Additional studies reviewed include: Previous EP office notes. ***  Assessment and Plan:  1. Paroxysmal atrial fib EKG today shows *** Continue toprol Continue coumadin for CHA2DS2/VASc of at least 5   2. HTN Stable on current regimen   3. H/o MVR Normal function by last echo 03/2021 and RHC 04/02/21  4. Mild/Mod AS By echo 03/2021  Follow up with {KKXFG:18299} in {EPFOLLOW BZ:16967}  Shirley Friar, PA-C  11/12/21 8:59 AM

## 2021-11-14 ENCOUNTER — Ambulatory Visit: Payer: Medicare Other | Admitting: Student

## 2021-11-14 DIAGNOSIS — I480 Paroxysmal atrial fibrillation: Secondary | ICD-10-CM

## 2021-11-14 DIAGNOSIS — I10 Essential (primary) hypertension: Secondary | ICD-10-CM

## 2021-11-14 DIAGNOSIS — Z952 Presence of prosthetic heart valve: Secondary | ICD-10-CM

## 2021-11-15 ENCOUNTER — Ambulatory Visit: Payer: Medicare Other

## 2021-11-20 ENCOUNTER — Ambulatory Visit: Payer: Medicare Other | Attending: Cardiology | Admitting: *Deleted

## 2021-11-20 DIAGNOSIS — I482 Chronic atrial fibrillation, unspecified: Secondary | ICD-10-CM | POA: Diagnosis not present

## 2021-11-20 DIAGNOSIS — Z5181 Encounter for therapeutic drug level monitoring: Secondary | ICD-10-CM

## 2021-11-20 DIAGNOSIS — Z952 Presence of prosthetic heart valve: Secondary | ICD-10-CM

## 2021-11-20 LAB — POCT INR: INR: 1.6 — AB (ref 2.0–3.0)

## 2021-11-20 NOTE — Patient Instructions (Addendum)
Description   Today take 1 tablet of warfarin and tomorrow take 1 tablet of warfarin then continue taking Warfarin 1/2 tablet daily. Recheck INR in 2 weeks. Be consistent with your 3 serving of greens per week and your 2 ensures a week. Coumadin Clinic [phone] or [phone].   Pt was started on Amiodarone at hospital discharge, loading dosage started 04/03/21.

## 2021-12-05 ENCOUNTER — Ambulatory Visit: Payer: Medicare Other | Attending: Cardiology

## 2021-12-05 DIAGNOSIS — Z5181 Encounter for therapeutic drug level monitoring: Secondary | ICD-10-CM

## 2021-12-05 DIAGNOSIS — I482 Chronic atrial fibrillation, unspecified: Secondary | ICD-10-CM | POA: Diagnosis not present

## 2021-12-05 DIAGNOSIS — Z952 Presence of prosthetic heart valve: Secondary | ICD-10-CM

## 2021-12-05 LAB — POCT INR: INR: 3.4 — AB (ref 2.0–3.0)

## 2021-12-05 NOTE — Patient Instructions (Signed)
Description   Continue taking Warfarin 1/2 tablet daily. Recheck INR in 3 weeks. Be consistent with your 3 serving of greens per week and your 2 ensures a week. Coumadin Clinic [phone].   Pt was started on Amiodarone at hospital discharge, loading dosage started 04/03/21.

## 2021-12-18 ENCOUNTER — Ambulatory Visit: Payer: Medicare Other | Admitting: Family Medicine

## 2021-12-26 ENCOUNTER — Ambulatory Visit: Payer: Medicare Other | Attending: Cardiovascular Disease | Admitting: *Deleted

## 2021-12-26 DIAGNOSIS — Z5181 Encounter for therapeutic drug level monitoring: Secondary | ICD-10-CM

## 2021-12-26 DIAGNOSIS — I482 Chronic atrial fibrillation, unspecified: Secondary | ICD-10-CM | POA: Diagnosis not present

## 2021-12-26 DIAGNOSIS — Z952 Presence of prosthetic heart valve: Secondary | ICD-10-CM

## 2021-12-26 LAB — POCT INR: INR: 3.4 — AB (ref 2.0–3.0)

## 2021-12-26 NOTE — Patient Instructions (Signed)
Description   Continue taking Warfarin 1/2 tablet daily. Recheck INR in 4 weeks. Be consistent with your 3 serving of greens per week and your 2 ensures a week. Coumadin Clinic [phone].   Pt was started on Amiodarone at hospital discharge, loading dosage started 04/03/21.

## 2021-12-27 ENCOUNTER — Ambulatory Visit: Payer: Medicare Other | Admitting: Student

## 2022-01-02 ENCOUNTER — Ambulatory Visit (INDEPENDENT_AMBULATORY_CARE_PROVIDER_SITE_OTHER): Payer: Medicare Other | Admitting: Podiatry

## 2022-01-02 ENCOUNTER — Other Ambulatory Visit: Payer: Self-pay | Admitting: Gastroenterology

## 2022-01-02 VITALS — BP 173/108 | HR 84

## 2022-01-02 DIAGNOSIS — K7460 Unspecified cirrhosis of liver: Secondary | ICD-10-CM

## 2022-01-02 DIAGNOSIS — B351 Tinea unguium: Secondary | ICD-10-CM

## 2022-01-02 DIAGNOSIS — M79674 Pain in right toe(s): Secondary | ICD-10-CM | POA: Diagnosis not present

## 2022-01-02 DIAGNOSIS — Z7901 Long term (current) use of anticoagulants: Secondary | ICD-10-CM | POA: Diagnosis not present

## 2022-01-02 DIAGNOSIS — M79675 Pain in left toe(s): Secondary | ICD-10-CM | POA: Diagnosis not present

## 2022-01-02 DIAGNOSIS — L84 Corns and callosities: Secondary | ICD-10-CM

## 2022-01-02 NOTE — Progress Notes (Signed)
Subjective:   Patient ID: [name], female   DOB: 86 y.o.   MRN: [id number]   HPI Chief Complaint  Patient presents with   Nail Problem    Patient came in today for Routine foot care, nail trim, Callus, and PAD, Seeing Vein and Vas    86 year old female presents the office with above concerns.  No swelling or or drainage to the toenail sites.  No open lesions.  She wears compression socks for "poor circulation".   She is on warfarin   Review of Systems  All other systems reviewed and are negative.  Past Medical History:  Diagnosis Date   Asthma    years ago   Chronic atrial fibrillation ([place])    Dilated cardiomyopathy ([place])    history of this, now resolved   Dysphagia    Hx   Fracture of tibial plateau 05/21/2015   GERD (gastroesophageal reflux disease)    GI bleed    Hemorrhoid 04/2014   bleeding   History of blood transfusion    Hypertension    Microcytic anemia    Nonsustained ventricular tachycardia ([facility])    Osteoporosis    Protein in urine    RBBB (right bundle branch block)    Rheumatic mitral valve disease     Past Surgical History:  Procedure Laterality Date   CHOLECYSTECTOMY  2005   COLONOSCOPY     COLONOSCOPY W/ POLYPECTOMY     EYE SURGERY  04/2014   HEMICOLECTOMY  2008   forpolyps   MITOMYCIN C APPLICATION Right [id number]   Procedure: MITOMYCIN C APPLICATION;  Surgeon: Marylynn Pearson, MD;  Location: [place];  Service: Ophthalmology;  Laterality: Right;   MITRAL VALVE REPLACEMENT  1987   St Jude mechanical valve   RIGHT/LEFT HEART CATH AND CORONARY ANGIOGRAPHY N/A 04/02/2021   Procedure: RIGHT/LEFT HEART CATH AND CORONARY ANGIOGRAPHY;  Surgeon: Jolaine Artist, MD;  Location: [place] [facility];  Service: Cardiovascular;  Laterality: N/A;   TRABECULECTOMY Right 05/17/2014   Procedure: TRABECULECTOMY WITH MITOMYCIN RIGHT EYE;  Surgeon: Marylynn Pearson, MD;  Location: [place];  Service: Ophthalmology;  Laterality: Right;   TUBAL LIGATION     US  ECHOCARDIOGRAPHY  02/2008, 03/2006, 06/2004     Current Outpatient Medications:    amiodarone (PACERONE) 200 MG tablet, Take one by mouth daily, Disp: 90 tablet, Rfl: 3   Cholecalciferol (VITAMIN D) 2000 units tablet, Take 1 tablet (2,000 Units total) by mouth daily., Disp: 100 tablet, Rfl: 3   dorzolamidel-timolol (COSOPT) 22.3-6.8 MG/ML SOLN ophthalmic solution, Place 1 drop into both eyes in the morning and at bedtime., Disp: , Rfl: 4   furosemide (LASIX) 40 MG tablet, Take 1 tablet (40 mg total) by mouth daily., Disp: 90 tablet, Rfl: 3   hydrALAZINE (APRESOLINE) 25 MG tablet, Take one half tablet three times a day with food, Disp: 95 tablet, Rfl: 3   isosorbide mononitrate (IMDUR) 30 MG 24 hr tablet, Take one half tablet by mouth daily, Disp: 45 tablet, Rfl: 3   LUMIGAN 0.01 % SOLN, Place 1 drop into both eyes at bedtime., Disp: , Rfl: 0   metoprolol succinate (TOPROL-XL) 100 MG 24 hr tablet, TAKE 1 TABLET BY MOUTH DAILY, Disp: 90 tablet, Rfl: 3   pantoprazole (PROTONIX) 40 MG tablet, TAKE 1 TABLET(40 MG) BY MOUTH DAILY, Disp: 90 tablet, Rfl: 3   polyethylene glycol (MIRALAX / GLYCOLAX) 17 g packet, Take 17 g by mouth daily as needed for mild constipation., Disp: , Rfl:  tamsulosin (FLOMAX) 0.4 MG CAPS capsule, Take 1 capsule (0.4 mg total) by mouth daily., Disp: 90 capsule, Rfl: 3   warfarin (COUMADIN) 10 MG tablet, TAKE 1/2 TO 1 TABLET BY MOUTH EVERY DAY AS DIRECTED BY COAGULATION CLINIC, Disp: 55 tablet, Rfl: 1  Allergies  Allergen Reactions   Iodinated Contrast Media Rash    Pt stated in the past had broken out in a rash on back and itching.    Esomeprazole Magnesium     REACTION: stomach upset, nausea          Objective:  Physical Exam  General: AAO x3, NAD  Dermatological: Hyperkeratotic lesion left sub 5 and right 2/5.  Without any underlying ulceration drainage or signs of infection.  Nails are hypertrophic, dystrophic with discoloration.  No hyperpigmentation.  No edema,  erythema.  No open lesions.   Vascular: Dorsalis Pedis artery and Posterior Tibial artery pedal pulses are 2/4 bilateral with immedate capillary fill time. There is no pain with calf compression, swelling, warmth, erythema.   Neruologic: Grossly intact via light touch bilateral.   Musculoskeletal: No other areas of discomfort.     Assessment:   86 year old female with symptomatic onychomycosis, hyperkeratotic lesions     Plan:  -Treatment options discussed including all alternatives, risks, and complications -Etiology of symptoms were discussed -Nails debrided 10 without complications or bleeding. -Sharply debrided hyperkeratotic lesions x 2 without any complications or bleeding. -Daily foot inspection -Follow-up in 3 months or sooner if any problems arise. In the meantime, encouraged to call the office with any questions, concerns, change in symptoms.   Ovid Curd, DPM

## 2022-01-22 ENCOUNTER — Ambulatory Visit: Payer: Medicare Other

## 2022-01-24 ENCOUNTER — Ambulatory Visit
Admission: RE | Admit: 2022-01-24 | Discharge: 2022-01-24 | Disposition: A | Discharge status: Home / Self Care | Payer: Medicare Other | Source: Ambulatory Visit | Attending: Gastroenterology | Admitting: Gastroenterology

## 2022-01-24 DIAGNOSIS — K7460 Unspecified cirrhosis of liver: Secondary | ICD-10-CM

## 2022-01-30 ENCOUNTER — Ambulatory Visit: Payer: 59 | Attending: Internal Medicine | Admitting: *Deleted

## 2022-01-30 DIAGNOSIS — Z952 Presence of prosthetic heart valve: Secondary | ICD-10-CM

## 2022-01-30 DIAGNOSIS — Z5181 Encounter for therapeutic drug level monitoring: Secondary | ICD-10-CM | POA: Diagnosis not present

## 2022-01-30 DIAGNOSIS — I482 Chronic atrial fibrillation, unspecified: Secondary | ICD-10-CM | POA: Diagnosis not present

## 2022-01-30 LAB — POCT INR: INR: 2.9 (ref 2.0–3.0)

## 2022-01-30 NOTE — Patient Instructions (Addendum)
Description   Continue taking Warfarin 1/2 tablet daily. Recheck INR in 6 weeks. Be consistent with your 3 serving of greens per week and your 2 ensures a week. Coumadin Clinic [phone].   Pt was started on Amiodarone at hospital discharge, loading dosage started 04/03/21.

## 2022-02-09 ENCOUNTER — Other Ambulatory Visit: Payer: Self-pay | Admitting: Family Medicine

## 2022-02-12 DIAGNOSIS — I509 Heart failure, unspecified: Secondary | ICD-10-CM | POA: Diagnosis not present

## 2022-02-12 DIAGNOSIS — N183 Chronic kidney disease, stage 3 unspecified: Secondary | ICD-10-CM | POA: Diagnosis not present

## 2022-02-12 DIAGNOSIS — R339 Retention of urine, unspecified: Secondary | ICD-10-CM | POA: Diagnosis not present

## 2022-03-03 ENCOUNTER — Other Ambulatory Visit: Payer: Self-pay | Admitting: Gastroenterology

## 2022-03-03 DIAGNOSIS — K7460 Unspecified cirrhosis of liver: Secondary | ICD-10-CM

## 2022-03-06 ENCOUNTER — Ambulatory Visit: Payer: 59 | Admitting: Internal Medicine

## 2022-03-07 DIAGNOSIS — H401133 Primary open-angle glaucoma, bilateral, severe stage: Secondary | ICD-10-CM | POA: Diagnosis not present

## 2022-03-07 DIAGNOSIS — H5213 Myopia, bilateral: Secondary | ICD-10-CM | POA: Diagnosis not present

## 2022-03-13 ENCOUNTER — Ambulatory Visit: Payer: 59

## 2022-03-17 ENCOUNTER — Ambulatory Visit: Payer: 59 | Attending: Cardiology

## 2022-03-17 DIAGNOSIS — I482 Chronic atrial fibrillation, unspecified: Secondary | ICD-10-CM

## 2022-03-17 DIAGNOSIS — Z952 Presence of prosthetic heart valve: Secondary | ICD-10-CM | POA: Diagnosis not present

## 2022-03-17 DIAGNOSIS — Z5181 Encounter for therapeutic drug level monitoring: Secondary | ICD-10-CM | POA: Diagnosis not present

## 2022-03-17 LAB — POCT INR: INR: 3.6 — AB (ref 2.0–3.0)

## 2022-03-17 NOTE — Patient Instructions (Signed)
Description   Eat a serving of greens today and continue taking Warfarin 1/2 tablet daily.  Recheck INR in 6 weeks.  Be consistent with your 3 serving of greens per week and your 2 ensures a week.  Coumadin Clinic [phone].  Pt was started on Amiodarone at hospital discharge, loading dosage started 04/03/21.

## 2022-03-30 ENCOUNTER — Other Ambulatory Visit: Payer: Self-pay | Admitting: Family Medicine

## 2022-04-04 ENCOUNTER — Observation Stay (HOSPITAL_COMMUNITY)
Admission: EM | Admit: 2022-04-04 | Discharge: 2022-04-05 | Disposition: A | Discharge status: Home Health | Payer: 59 | Attending: Family Medicine | Admitting: Family Medicine

## 2022-04-04 ENCOUNTER — Emergency Department (HOSPITAL_COMMUNITY): Payer: 59

## 2022-04-04 ENCOUNTER — Other Ambulatory Visit: Payer: Self-pay

## 2022-04-04 ENCOUNTER — Encounter (HOSPITAL_COMMUNITY): Payer: Self-pay

## 2022-04-04 DIAGNOSIS — R9431 Abnormal electrocardiogram [ECG] [EKG]: Secondary | ICD-10-CM | POA: Diagnosis not present

## 2022-04-04 DIAGNOSIS — I10 Essential (primary) hypertension: Secondary | ICD-10-CM | POA: Diagnosis present

## 2022-04-04 DIAGNOSIS — Z87891 Personal history of nicotine dependence: Secondary | ICD-10-CM | POA: Diagnosis not present

## 2022-04-04 DIAGNOSIS — I350 Nonrheumatic aortic (valve) stenosis: Secondary | ICD-10-CM | POA: Insufficient documentation

## 2022-04-04 DIAGNOSIS — I482 Chronic atrial fibrillation, unspecified: Secondary | ICD-10-CM | POA: Insufficient documentation

## 2022-04-04 DIAGNOSIS — Z79899 Other long term (current) drug therapy: Secondary | ICD-10-CM | POA: Insufficient documentation

## 2022-04-04 DIAGNOSIS — R6889 Other general symptoms and signs: Secondary | ICD-10-CM | POA: Diagnosis not present

## 2022-04-04 DIAGNOSIS — Z7901 Long term (current) use of anticoagulants: Secondary | ICD-10-CM | POA: Insufficient documentation

## 2022-04-04 DIAGNOSIS — I4510 Unspecified right bundle-branch block: Secondary | ICD-10-CM | POA: Diagnosis not present

## 2022-04-04 DIAGNOSIS — N189 Chronic kidney disease, unspecified: Secondary | ICD-10-CM | POA: Diagnosis not present

## 2022-04-04 DIAGNOSIS — I5022 Chronic systolic (congestive) heart failure: Secondary | ICD-10-CM | POA: Diagnosis not present

## 2022-04-04 DIAGNOSIS — R0789 Other chest pain: Principal | ICD-10-CM | POA: Insufficient documentation

## 2022-04-04 DIAGNOSIS — Z952 Presence of prosthetic heart valve: Secondary | ICD-10-CM | POA: Diagnosis not present

## 2022-04-04 DIAGNOSIS — R079 Chest pain, unspecified: Secondary | ICD-10-CM | POA: Diagnosis not present

## 2022-04-04 DIAGNOSIS — I499 Cardiac arrhythmia, unspecified: Secondary | ICD-10-CM | POA: Diagnosis not present

## 2022-04-04 DIAGNOSIS — I130 Hypertensive heart and chronic kidney disease with heart failure and stage 1 through stage 4 chronic kidney disease, or unspecified chronic kidney disease: Secondary | ICD-10-CM | POA: Diagnosis not present

## 2022-04-04 DIAGNOSIS — I4821 Permanent atrial fibrillation: Secondary | ICD-10-CM | POA: Insufficient documentation

## 2022-04-04 DIAGNOSIS — Z743 Need for continuous supervision: Secondary | ICD-10-CM | POA: Diagnosis not present

## 2022-04-04 LAB — BASIC METABOLIC PANEL
Anion gap: 13 (ref 5–15)
BUN: 54 mg/dL — ABNORMAL HIGH (ref 8–23)
CO2: 20 mmol/L — ABNORMAL LOW (ref 22–32)
Calcium: 9.4 mg/dL (ref 8.9–10.3)
Chloride: 104 mmol/L (ref 98–111)
Creatinine, Ser: 2.86 mg/dL — ABNORMAL HIGH (ref 0.44–1.00)
GFR, Estimated: 16 mL/min — ABNORMAL LOW (ref >60)
Glucose, Bld: 101 mg/dL — ABNORMAL HIGH (ref 70–99)
Potassium: 3.5 mmol/L (ref 3.5–5.1)
Sodium: 137 mmol/L (ref 135–145)

## 2022-04-04 LAB — CBC WITH DIFFERENTIAL/PLATELET
Abs Immature Granulocytes: 0.02 10*3/uL (ref 0.00–0.07)
Basophils Absolute: 0 10*3/uL (ref 0.0–0.1)
Basophils Relative: 1 %
Eosinophils Absolute: 0 10*3/uL (ref 0.0–0.5)
Eosinophils Relative: 1 %
HCT: 39.1 % (ref 36.0–46.0)
Hemoglobin: 12 g/dL (ref 12.0–15.0)
Immature Granulocytes: 0 %
Lymphocytes Relative: 37 %
Lymphs Abs: 2.1 10*3/uL (ref 0.7–4.0)
MCH: 27.9 pg (ref 26.0–34.0)
MCHC: 30.7 g/dL (ref 30.0–36.0)
MCV: 90.9 fL (ref 80.0–100.0)
Monocytes Absolute: 0.6 10*3/uL (ref 0.1–1.0)
Monocytes Relative: 10 %
Neutro Abs: 3 10*3/uL (ref 1.7–7.7)
Neutrophils Relative %: 51 %
Platelets: 188 10*3/uL (ref 150–400)
RBC: 4.3 MIL/uL (ref 3.87–5.11)
RDW: 15.7 % — ABNORMAL HIGH (ref 11.5–15.5)
WBC: 5.8 10*3/uL (ref 4.0–10.5)
nRBC: 0 % (ref 0.0–0.2)

## 2022-04-04 LAB — BRAIN NATRIURETIC PEPTIDE: B Natriuretic Peptide: 360.6 pg/mL — ABNORMAL HIGH (ref 0.0–100.0)

## 2022-04-04 LAB — PROTIME-INR
INR: 2.6 — ABNORMAL HIGH (ref 0.8–1.2)
Prothrombin Time: 27.4 seconds — ABNORMAL HIGH (ref 11.4–15.2)

## 2022-04-04 LAB — MRSA NEXT GEN BY PCR, NASAL: MRSA by PCR Next Gen: NOT DETECTED

## 2022-04-04 LAB — TROPONIN I (HIGH SENSITIVITY)
Troponin I (High Sensitivity): 20 ng/L — ABNORMAL HIGH (ref <18)
Troponin I (High Sensitivity): 26 ng/L — ABNORMAL HIGH (ref <18)

## 2022-04-04 MED ORDER — PANTOPRAZOLE SODIUM 40 MG PO TBEC
40.0000 mg | DELAYED_RELEASE_TABLET | Freq: Every day | ORAL | Status: DC
  Administered 2022-04-04 – 2022-04-05 (×2): 40 mg via ORAL
  Filled 2022-04-04 (×2): qty 1

## 2022-04-04 MED ORDER — ACETAMINOPHEN 325 MG PO TABS
650.0000 mg | ORAL_TABLET | Freq: Four times a day (QID) | ORAL | Status: DC | PRN
  Administered 2022-04-04: 650 mg via ORAL
  Filled 2022-04-04: qty 2

## 2022-04-04 MED ORDER — HYDRALAZINE HCL 25 MG PO TABS
12.5000 mg | ORAL_TABLET | Freq: Three times a day (TID) | ORAL | Status: DC
  Administered 2022-04-04 – 2022-04-05 (×4): 12.5 mg via ORAL
  Filled 2022-04-04 (×4): qty 1

## 2022-04-04 MED ORDER — WARFARIN SODIUM 5 MG PO TABS
5.0000 mg | ORAL_TABLET | Freq: Once | ORAL | Status: AC
  Administered 2022-04-04: 5 mg via ORAL
  Filled 2022-04-04: qty 1

## 2022-04-04 MED ORDER — METOPROLOL TARTRATE 5 MG/5ML IV SOLN: 5.0000 mg | Freq: Four times a day (QID) | INTRAVENOUS | Status: DC | PRN

## 2022-04-04 MED ORDER — AMIODARONE HCL 200 MG PO TABS: 200.0000 mg | ORAL_TABLET | Freq: Every day | ORAL | Status: DC

## 2022-04-04 MED ORDER — WARFARIN - PHARMACIST DOSING INPATIENT: Freq: Every day | Status: DC

## 2022-04-04 MED ORDER — TAMSULOSIN HCL 0.4 MG PO CAPS
0.4000 mg | ORAL_CAPSULE | Freq: Every day | ORAL | Status: DC
  Administered 2022-04-04 – 2022-04-05 (×2): 0.4 mg via ORAL
  Filled 2022-04-04 (×2): qty 1

## 2022-04-04 MED ORDER — METOPROLOL SUCCINATE ER 100 MG PO TB24
100.0000 mg | ORAL_TABLET | Freq: Every day | ORAL | Status: DC
  Administered 2022-04-04 – 2022-04-05 (×2): 100 mg via ORAL
  Filled 2022-04-04 (×2): qty 1

## 2022-04-04 MED ORDER — FUROSEMIDE 40 MG PO TABS
40.0000 mg | ORAL_TABLET | Freq: Every day | ORAL | Status: DC
  Administered 2022-04-04 – 2022-04-05 (×2): 40 mg via ORAL
  Filled 2022-04-04 (×2): qty 1

## 2022-04-04 MED ORDER — POLYETHYLENE GLYCOL 3350 17 G PO PACK: 17.0000 g | PACK | Freq: Every day | ORAL | Status: DC | PRN

## 2022-04-04 MED ORDER — ACETAMINOPHEN 650 MG RE SUPP: 650.0000 mg | Freq: Four times a day (QID) | RECTAL | Status: DC | PRN

## 2022-04-04 MED ORDER — ISOSORBIDE MONONITRATE ER 30 MG PO TB24
15.0000 mg | ORAL_TABLET | Freq: Every day | ORAL | Status: DC
  Administered 2022-04-04 – 2022-04-05 (×2): 15 mg via ORAL
  Filled 2022-04-04 (×2): qty 1

## 2022-04-04 NOTE — Progress Notes (Addendum)
ANTICOAGULATION CONSULT NOTE - Initial Consult  Pharmacy Consult for warfarin  Indication: atrial fibrillation // mechanical mitral valve   Allergies  Allergen Reactions   Iodinated Contrast Media Rash    Pt stated in the past had broken out in a rash on back and itching.    Esomeprazole Magnesium     REACTION: stomach upset, nausea    Patient Measurements: Height: 5\' 8"  (172.7 cm) Weight: 69.9 kg (154 lb) IBW/kg (Calculated) : 63.9  Vital Signs: Temp: 98 F (36.7 C) (03/15 1527) Temp Source: Oral (03/15 1527) BP: 175/109 (03/15 1500) Pulse Rate: 70 (03/15 1500)  Labs: Recent Labs    04/04/22 1100 04/04/22 1302  HGB 12.0  --   HCT 39.1  --   PLT 188  --   LABPROT 27.4*  --   INR 2.6*  --   CREATININE 2.86*  --   TROPONINIHS 20* 26*    Estimated Creatinine Clearance: 14.8 mL/min (A) (by C-G formula based on SCr of 2.86 mg/dL (H)).   Medical History: Past Medical History:  Diagnosis Date   Asthma    years ago   Chronic atrial fibrillation ([place])    Dilated cardiomyopathy ([place])    history of this, now resolved   Dysphagia    Hx   Fracture of tibial plateau 05/21/2015   GERD (gastroesophageal reflux disease)    GI bleed    Hemorrhoid 04/2014   bleeding   History of blood transfusion    Hypertension    Microcytic anemia    Nonsustained ventricular tachycardia ([facility])    Osteoporosis    Protein in urine    RBBB (right bundle branch block)    Rheumatic mitral valve disease      Assessment: Patient admitted with CC of chest pain. Small elevation in trop. On warfarin PTA taking 5mg  once daily with last dose on 3/14 at 17:00. INR today therapeutic at 2.6. Noted DDI with amiodarone, please note that amiodarone held on 3/15 for QTC of 589.    Goal of Therapy:  INR 2.5-3.5  Monitor platelets by anticoagulation protocol: Yes   Plan:  Warfarin 5mg  x 1.  F/u restart of amiodarone. Due to extended duration of action expect the effects on INR to linger and  patient at lower end of therapeutic range.  Daily INR.  Monitor for s/sx of bleeding.   Esmeralda Arthur, PharmD, BCCCP  04/04/2022,4:50 PM

## 2022-04-04 NOTE — Progress Notes (Addendum)
FMTS Interim Progress Note  S: Paged by RN for patient's returning chest pain.  Patient states pain began after she took her nighttime meds.  States this is different than what she was admitted for.  Describes this as a squeezing located primarily on the right side of her chest.  Does not radiate.  O: BP (!) 168/109   Pulse 65   Temp 97.7 F (36.5 C) (Oral)   Resp 13   Ht 5\' 8"  (1.727 m)   Wt 67.6 kg   SpO2 98%   BMI 22.66 kg/m   General: NAD, appears in pain, less comfortable than previous Cardiovascular: Irregularly irregular rhythm, no murmurs, no peripheral edema Respiratory: normal WOB on RA, CTAB, no wheezes, ronchi or rales Extremities: Moving all 4 extremities equally   A/P: Chest pain Concern for ACS versus dissection versus new structural cardiac abnormality. VSS, while still hypertensive. Given change in pain presentation while patient is at rest, will workup for ACS. -Repeat EKG -Repeat troponins -Consider CT Chest, caution with contrast given kidney function  Salvadore Oxford, MD 04/04/2022, 11:29 PM PGY-1, [facility] pager [phone]  Update: Repeat EKG without new ST changes from previous. Patient continues to be in atrial fibrillation with PVCs.

## 2022-04-04 NOTE — Progress Notes (Signed)
FMTS Attending Admission Note: Yehuda Savannah, MD  Personal pager:  [phone] FPTS Service Pager:  [phone]   I  have seen and examined this patient, reviewed their chart. I have discussed this patient with the resident. I agree with the resident's findings, assessment and care plan.  Ms Hairfield is a sweet 87 yo F with PMH dilated CM, HFrEF, AVR on Coumadin, chronic Afib who presents with exertional chest pain for the past day, as well as some dizziness with ambulation. At time of my interview, she denies chest pain but notes its worse when she walks around and radiates to her back, and has dyspnea with exertion. Denies any current shortness of breath, fevers, chills, coughing.  On exam, heart irregularly irregular with murmur present. Lungs CTAB. No JVD or edema. NO chest wall tenderness to palpation.  Imaging and lab work reveiwed from ED. Troponins flat. INR in therapeutic range. EKG with RBBB and LAFB, unchanged from previous. Qtc prolonged.  Atypical exertional chest pain and dyspnea- ddx related to CHF vs arrythmia, will get ECHO and monitor on tele. Troponins flat and EKG without acute changes and recent heart cath 03/3021 without CAD, making ACS unlikely. Wells score 0, no tachycardia or hypoxia making PE very unlikely, also as she is therapeutic INR currently. Consider cardiology involvement pending ECHO results. Dilated CM with AVR replacement- no signs of heart failure on exam. Continue home warfarin. Will restart her home medications. HTN- restart her home medications, very hypertensive here but hasn't had them since coming to ED.   Will sign resident H&P when its available.

## 2022-04-04 NOTE — Progress Notes (Signed)
FMTS Interim Progress Note  S: No acute events since admission. Is not currently having chest pain. Describes it as sharp and stabbing all over her chest. Some additional pain in back which is chronic.   O: BP (!) 174/111 (BP Location: Right Arm)   Pulse 65   Temp 97.7 F (36.5 C) (Oral)   Resp 13   Ht 5\' 8"  (1.727 m)   Wt 67.6 kg   SpO2 98%   BMI 22.66 kg/m   General: NAD, lying comfortably in hospital bed Cardiovascular: irregularly irregular rhythm, no murmurs, no peripheral edema Respiratory: normal WOB on RA, CTAB, no wheezes, ronchi or rales Extremities: Moving all 4 extremities equally, 2+ dorsalis pedis pulses   A/P: Chest pain Concern for new structural abnormality given prosthetic valve, CHF, and aortic stenosis, and aneurysm. VSS, with low concern for ACS at this time given resolution of chest pain and previous negative work up. -Follow up ECHO -Continue warfarin -Continue BP meds -Consider CTA if clinically worsening    Remained of plan per day team, orders reviewed.  Salvadore Oxford, MD 04/04/2022, 8:05 PM PGY-1, [facility] Service pager [phone]

## 2022-04-04 NOTE — ED Provider Notes (Signed)
[facility] Provider Note   CSN: [id number] Arrival date & time: 04/04/22  1043     History  Chief Complaint  Patient presents with   Chest Pain    [name] is a 87 y.o. female.  87 year old patient presents via EMS after patient began to have left-sided chest pain rating to her back.  Has been having this since yesterday.  Symptoms are exertional and better with rest.  Patient has history of CHF.  Also has a history of aortic valve replacement and is on Coumadin.  Patient given nitroglycerin with some relief.  No nausea or vomiting.  Pain characterized as squeezing and sharp.  No recent fever cough congestion.  Does note some lower extremity edema.       Home Medications Prior to Admission medications   Medication Sig Start Date End Date Taking? Authorizing Provider  amiodarone (PACERONE) 200 MG tablet Take one by mouth daily 04/17/21   Dickie La, MD  Cholecalciferol (VITAMIN D) 2000 units tablet Take 1 tablet (2,000 Units total) by mouth daily. 06/10/17   Dickie La, MD  dorzolamidel-timolol (COSOPT) 22.3-6.8 MG/ML SOLN ophthalmic solution Place 1 drop into both eyes in the morning and at bedtime. 03/02/17   [provider]  furosemide (LASIX) 40 MG tablet Take 1 tablet (40 mg total) by mouth daily. 04/17/21   Dickie La, MD  hydrALAZINE (APRESOLINE) 25 MG tablet TAKE 1/2 TABLET BY MOUTH THREE TIMES DAILY WITH FOOD 02/11/22   Dickie La, MD  isosorbide mononitrate (IMDUR) 30 MG 24 hr tablet Take one half tablet by mouth daily 04/17/21   Dickie La, MD  LUMIGAN 0.01 % SOLN Place 1 drop into both eyes at bedtime. 01/26/17   [provider]  metoprolol succinate (TOPROL-XL) 100 MG 24 hr tablet TAKE 1 TABLET BY MOUTH DAILY 04/30/21   Dickie La, MD  pantoprazole (PROTONIX) 40 MG tablet TAKE 1 TABLET(40 MG) BY MOUTH DAILY 10/25/21   Dickie La, MD  polyethylene glycol (MIRALAX / GLYCOLAX) 17 g packet Take 17 g  by mouth daily as needed for mild constipation. 07/27/20   Almyra Deforest, PA  tamsulosin (FLOMAX) 0.4 MG CAPS capsule TAKE 1 CAPSULE(0.4 MG) BY MOUTH DAILY 03/31/22   Dickie La, MD  warfarin (COUMADIN) 10 MG tablet TAKE 1/2 TO 1 TABLET BY MOUTH EVERY DAY AS DIRECTED BY COAGULATION CLINIC 08/22/21   Evans Lance, MD      Allergies    Iodinated contrast media and Esomeprazole magnesium    Review of Systems   Review of Systems  All other systems reviewed and are negative.   Physical Exam Updated Vital Signs Pulse 79   Temp 97.7 F (36.5 C)   Resp 18   Ht 1.727 m (5\' 8" )   Wt 69.9 kg   SpO2 100%   BMI 23.42 kg/m  Physical Exam Vitals and nursing note reviewed.  Constitutional:      General: She is not in acute distress.    Appearance: Normal appearance. She is well-developed. She is not toxic-appearing.  HENT:     Head: Normocephalic and atraumatic.  Eyes:     General: Lids are normal.     Conjunctiva/sclera: Conjunctivae normal.     Pupils: Pupils are equal, round, and reactive to light.  Neck:     Thyroid: No thyroid mass.     Trachea: No tracheal deviation.  Cardiovascular:  Rate and Rhythm: Normal rate and regular rhythm.     Heart sounds: Normal heart sounds. No murmur heard.    No gallop.  Pulmonary:     Effort: Pulmonary effort is normal. No respiratory distress.     Breath sounds: Decreased air movement present. No stridor. Examination of the right-lower field reveals decreased breath sounds. Examination of the left-lower field reveals decreased breath sounds. Decreased breath sounds present. No wheezing, rhonchi or rales.  Abdominal:     General: There is no distension.     Palpations: Abdomen is soft.     Tenderness: There is no abdominal tenderness. There is no rebound.  Musculoskeletal:        General: No tenderness. Normal range of motion.     Cervical back: Normal range of motion and neck supple.     Comments: 3+ bilateral lower extremity pitting edema   Skin:    General: Skin is warm and dry.     Findings: No abrasion or rash.  Neurological:     Mental Status: She is alert and oriented to person, place, and time. Mental status is at baseline.     GCS: GCS eye subscore is 4. GCS verbal subscore is 5. GCS motor subscore is 6.     Cranial Nerves: No cranial nerve deficit.     Sensory: No sensory deficit.     Motor: Motor function is intact.  Psychiatric:        Attention and Perception: Attention normal.        Speech: Speech normal.        Behavior: Behavior normal.     ED Results / Procedures / Treatments   Labs (all labs ordered are listed, but only abnormal results are displayed) Labs Reviewed  CBC WITH DIFFERENTIAL/PLATELET  BASIC METABOLIC PANEL  BRAIN NATRIURETIC PEPTIDE  PROTIME-INR  TROPONIN I (HIGH SENSITIVITY)    EKG EKG Interpretation  Date/Time:  Friday April 04 2022 10:55:31 EDT Ventricular Rate:  88 PR Interval:    QRS Duration: 208 QT Interval:  486 QTC Calculation: 589 R Axis:   253 Text Interpretation: Atrial fibrillation Ventricular premature complex RBBB and LAFB No significant change since last tracing Confirmed by Lacretia Leigh ([id number]) on 04/04/2022 11:07:49 AM  Radiology No results found.  Procedures Procedures    Medications Ordered in ED Medications - No data to display  ED Course/ Medical Decision Making/ A&P                             Medical Decision Making Amount and/or Complexity of Data Reviewed Labs: ordered. Radiology: ordered.   Chest x-ray per interpretation shows no acute findings.  EKG per my interpretation shows no signs of acute coronary ischemia.  Concern for possible ACS but troponins are flat at this time.  Patient does have new O2 requirement.  She is subtherapeutic on her Coumadin.  Will admit for observation.  Discussed with family practice resident        Final Clinical Impression(s) / ED Diagnoses Final diagnoses:  None    Rx / DC Orders ED  Discharge Orders     None         Lacretia Leigh, MD 04/04/22 1544

## 2022-04-04 NOTE — ED Triage Notes (Signed)
Pt bib ems from home; yesterday began having L sided cp, radiating to back this am, worsening; sob with exertion; 100% RA, diminished R side; hx fluid buildup; 12 lead shows RBBB, wide; 1 nitro, 324 asa given pta; bp 99991111 systolic initally, Q000111Q after nitro; placed on 2L  for comfort; hx artificial valve; denies N/V; pain described as pressure, squeezing, sharp

## 2022-04-04 NOTE — Assessment & Plan Note (Addendum)
Elevated overnight but now normotensive this AM. -Home med hydral 12.5 mg TID, Imdur 15 mg daily

## 2022-04-04 NOTE — Hospital Course (Signed)
[name] is a 87 y.o. female presenting with acute onset exertional chest pain and SOB. Past medical history is significant for Chronic A-fib, dilated cardiomyopathy, GERD, GI bleed, hemorrhoid, hypertension, microcytic anemia, osteoporosis, right bundle branch block, rheumatic mitral valve disease.  Her hospital course is outlined below:  Chest pain: Patient presented to the ED after having new onset exertional chest pain. In the ED, VS significant for hypertension but were otherwise stable.  BMP and CBC were consistent with her prior studies.  CXR was negative for active cardiopulmonary disease.  She is placed on 2 L O2 for comfort.  Called for admission for observation and further cardiac workup.  Ambulatory pulse ox showed***.  Echocardiogram was ordered and showed***.  Prolonged QTc: EKG on admission showed QTc of 588.  Due to this her home amiodarone was held per pharmacy recommendations.  Repeat EKG on 3/16 showed QTc of***.  At discharge she was restarted on her home amiodarone***.  Hypertension: Patient's blood pressures on presentation in the high 190s.  She had not taken any of her medications prior to coming to the hospital.  She was restarted on her home medications and was discharged with no medication changes***.  PCP follow-up:

## 2022-04-04 NOTE — ED Notes (Signed)
Pt assisted on bedpan.

## 2022-04-04 NOTE — ED Notes (Signed)
Pt's daughter [name]) notified of pt's arrival to ED per pt request

## 2022-04-04 NOTE — Assessment & Plan Note (Signed)
Repeat EKG this AM showed QT prolongation (correct likely not prolonged per Cards), afib, RBBB. Case discuss with Cards fellow overnight who did not recommend intervention. -EKG in AM -Restart home amiodarone given corrected QT appropriate per Cards

## 2022-04-04 NOTE — Assessment & Plan Note (Addendum)
Reports intermittent R chest pain/pressure that radiates to R arm this AM. Occurs at rest and with exertion. H/o dilated cardiomyopathy, prosthetic mitral valve, RBBB and chronic afib. Troponins negative. -Cardiology consulted, appreciate recs -Echo pending  -PT/OT following

## 2022-04-04 NOTE — H&P (Cosign Needed Addendum)
Hospital Admission History and Physical Service Pager: [phone]  [name] Medical record number: [id number] Date of Birth: 07/23/1935 Age: 87 y.o. Gender: female  Primary Care Provider: Dickie La, MD Consultants: None Code Status: Full  Preferred Emergency Contact:  Contact Information     Name Relation Home Work [name] Daughter [phone]        Chief Complaint: Chest pain  Assessment and Plan: [name] is a 87 y.o. female presenting with exertional chest pain . Differential for this patient's presentation of this includes acute on chronic HFrEF exacerbation, stable angina, PE, mechanical valve insufficiency, aortic aneurysm, aortic stenosis.  With patient's complicated cardiac history with mechanical valve placed in the 1980s and new onset exertional shortness of breath concern for structural abnormality like valve insufficiency.  Low suspicion for PE at this time as patient is not tachycardic, with therapeutic warfarin, and Wells score 0.   * Chest pain Last echo in 03/2021 LVEF 35-40%, global hypokinesis, left atrial size massively dilated, mitral repair/replacement, mild to moderate aortic stenosis.  - Admit to [facility]MTS, attending Dr. Thompson Grayer - med-tele, Vital signs per floor - Regular diet  - PT/OT to treat - VTE prophylaxis: Cont warfarin  - AM CBC/BMP  - Echo ordered  - restart Imdur  - ambulatory pulse ox   Abnormal EKG Qtc 588 - repeat EKG 3/17 - hold amiodarone   Hypertension Elevated in the ED in the high 190s, patient had not taken her home medications of hydral 12.5 mg TID, Imdur 15 mg daily - restart BP meds - Lopressor PRN for systolic pressures [id number] - monitor BP   Chronic and Stable:  A-fib: Warfarin per pharmacy GERD: Continue Protonix  FEN/GI: regular diet  VTE Prophylaxis: warfarin   Disposition: med-tele, Attending Dr. Thompson Grayer  History of Present Illness:  [name] is a 87 y.o. female presenting  with chest pain on exertion starting yesterday.  Occurs intermittently. Feels that when she exerts herself she is not getting enough air. Denies nausea, vomiting. States she just got out of the hospital around October of last year for fluid overload. Unsure when she last saw her Cardiologist.   Denies headeache. Endorses pain in her back. Denies taking any medication today. Typically takes her meds daily. Lives by herself, performs own ADLs. Daughter helps  In the ED, VS significant for hypertension, otherwise stable.  BMP and CBC were consistent with her prior studies.  CXR was negative for active cardiopulmonary disease.  She is placed on 2 L O2 for comfort.  Called for admission for observation and further cardiac workup.  Review Of Systems: Per HPI with the following additions: as above  Pertinent Past Medical History: Chronic A-fib, dilated cardiomyopathy, GERD, GI bleed, hemorrhoid, hypertension, microcytic anemia, osteoporosis, right bundle branch block, rheumatic mitral valve disease Remainder reviewed in history tab.   Pertinent Past Surgical History: Cholecystectomy 2005 Hemicolectomy 2008 Mitral valve replacement 1987 Right and left heart cath 2023 Remainder reviewed in history tab.   Pertinent Social History: Tobacco use: Former, 1.5 PPD quit 1987 Alcohol use: Denies Other Substance use: Denies Lives by herself  Pertinent Family History: Mother: Hypertension, stroke Father: Prostate cancer Sister: Bladder cancer Remainder reviewed in history tab.   Important Outpatient Medications: Amiodarone 200 mg daily Lasix 40 mg daily Hydralazine 12.5 mg 3 times daily Imdur 15 mg daily Toprol 100 mg daily Protonix 40 mg daily MiraLAX Flomax 0.4 mg daily Warfarin 5-10 mg daily  as directed by coagulation clinic Remainder reviewed in medication history.   Objective: BP (!) 175/109   Pulse 70   Temp 98 F (36.7 C) (Oral)   Resp 14   Ht 5\' 8"  (1.727 m)   Wt 69.9 kg    SpO2 100%   BMI 23.42 kg/m  Exam: Chronically ill-appearing, no acute distress Cardio: Regular rate, irregularly irregular rhythm, no murmurs on exam. Pulm: Clear, no wheezing, no crackles. No increased work of breathing Abdominal: bowel sounds present, soft, non-distended, mild abdominal tenderness Extremities: Trace peripheral edema  Neuro: alert and oriented x3, speech normal in content, no facial asymmetry, pupils equal and reactive to light.   Labs:  CBC BMET  Recent Labs  Lab 04/04/22 1100  WBC 5.8  HGB 12.0  HCT 39.1  PLT 188   Recent Labs  Lab 04/04/22 1100  NA 137  K 3.5  CL 104  CO2 20*  BUN 54*  CREATININE 2.86*  GLUCOSE 101*  CALCIUM 9.4    Pertinent additional labs BNP 360, troponin flat, INR 2.6, PT 27.4.   EKG: Rate controlled A-fib, no ST elevations, QTc 589  Imaging Studies Performed:  CXR: clear with no consolidations, normal cardiac silhouette, sharp costophrenic angles   Tami Current, DO 04/04/2022, 4:58 PM PGY-1, [facility] Intern pager: [phone], text pages welcome Secure chat group [facility] Upper-Level Resident Addendum   I have independently interviewed and examined the patient. I have discussed the above with the original author and agree with their documentation. My edits for correction/addition/clarification are included. Please see also any attending notes.   [name], D.O. PGY-3, [facility] 04/06/2022 2:56 PM  [facility] Service pager: [phone] (text pages welcome through [facility])

## 2022-04-04 NOTE — ED Notes (Signed)
ED TO INPATIENT HANDOFF REPORT  ED Nurse Name and Phone #:   S Name/Age/Gender [name] 87 y.o. female Room/Bed: RESUSC/RESUSC  Code Status   Code Status: Full Code  Home/SNF/Other Home Patient oriented to: self, place, time, and situation Is this baseline? Yes   Triage Complete: Triage complete  Chief Complaint Chest pain [R07.9]  Triage Note Pt bib ems from home; yesterday began having L sided cp, radiating to back this am, worsening; sob with exertion; 100% RA, diminished R side; hx fluid buildup; 12 lead shows RBBB, wide; 1 nitro, 324 asa given pta; bp 99991111 systolic initally, Q000111Q after nitro; placed on 2L [place] for comfort; hx artificial valve; denies N/V; pain described as pressure, squeezing, sharp   Allergies Allergies  Allergen Reactions   Iodinated Contrast Media Rash    Pt stated in the past had broken out in a rash on back and itching.    Esomeprazole Magnesium     REACTION: stomach upset, nausea    Level of Care/Admitting Diagnosis ED Disposition     ED Disposition  Admit   Condition  --   Comment  Hospital Area: [facility] [[id number]]  Level of Care: Telemetry Medical [104]  May place patient in observation at [facility] or [facility] if equivalent level of care is available:: Yes  Covid Evaluation: Asymptomatic - no recent exposure (last 10 days) testing not required  Diagnosis: Chest pain [id number]  Admitting Physician: Lenoria Chime [id number]  Attending Physician: Lenoria Chime [id number]          B Medical/Surgery History Past Medical History:  Diagnosis Date   Asthma    years ago   Chronic atrial fibrillation ([place])    Dilated cardiomyopathy ([place])    history of this, now resolved   Dysphagia    Hx   Fracture of tibial plateau 05/21/2015   GERD (gastroesophageal reflux disease)    GI bleed    Hemorrhoid 04/2014   bleeding   History of blood transfusion    Hypertension    Microcytic anemia    Nonsustained  ventricular tachycardia ([place])    Osteoporosis    Protein in urine    RBBB (right bundle branch block)    Rheumatic mitral valve disease    Past Surgical History:  Procedure Laterality Date   CHOLECYSTECTOMY  2005   COLONOSCOPY     COLONOSCOPY W/ POLYPECTOMY     EYE SURGERY  04/2014   HEMICOLECTOMY  2008   forpolyps   MITOMYCIN C APPLICATION Right [id number]   Procedure: MITOMYCIN C APPLICATION;  Surgeon: Marylynn Pearson, MD;  Location: [facility];  Service: Ophthalmology;  Laterality: Right;   MITRAL VALVE REPLACEMENT  1987   St Jude mechanical valve   RIGHT/LEFT HEART CATH AND CORONARY ANGIOGRAPHY N/A 04/02/2021   Procedure: RIGHT/LEFT HEART CATH AND CORONARY ANGIOGRAPHY;  Surgeon: Jolaine Artist, MD;  Location: [facility] CV [facility];  Service: Cardiovascular;  Laterality: N/A;   TRABECULECTOMY Right 05/17/2014   Procedure: TRABECULECTOMY WITH MITOMYCIN RIGHT EYE;  Surgeon: Marylynn Pearson, MD;  Location: [facility];  Service: Ophthalmology;  Laterality: Right;   TUBAL LIGATION     US ECHOCARDIOGRAPHY  02/2008, 03/2006, 06/2004     A IV Location/Drains/Wounds Patient Lines/Drains/Airways Status     Active Line/Drains/Airways     Name Placement date Placement time Site Days   Peripheral IV 04/04/22 20 G Anterior;Left Forearm 04/04/22  1101  Forearm  less than 1   Urethral Catheter  Dina Rich RN Non-latex 14 Fr. 03/30/21  1900  Non-latex  370            Intake/Output Last 24 hours No intake or output data in the 24 hours ending 04/04/22 1649  Labs/Imaging Results for orders placed or performed during the hospital encounter of 04/04/22 (from the past 48 hour(s))  CBC with Differential/Platelet     Status: Abnormal   Collection Time: 04/04/22 11:00 AM  Result Value Ref Range   WBC 5.8 4.0 - 10.5 K/uL   RBC 4.30 3.87 - 5.11 MIL/uL   Hemoglobin 12.0 12.0 - 15.0 g/dL   HCT 39.1 36.0 - 46.0 %   MCV 90.9 80.0 - 100.0 fL   MCH 27.9 26.0 - 34.0 pg   MCHC 30.7 30.0 - 36.0 g/dL   RDW 15.7 (H)  11.5 - 15.5 %   Platelets 188 150 - 400 K/uL   nRBC 0.0 0.0 - 0.2 %   Neutrophils Relative % 51 %   Neutro Abs 3.0 1.7 - 7.7 K/uL   Lymphocytes Relative 37 %   Lymphs Abs 2.1 0.7 - 4.0 K/uL   Monocytes Relative 10 %   Monocytes Absolute 0.6 0.1 - 1.0 K/uL   Eosinophils Relative 1 %   Eosinophils Absolute 0.0 0.0 - 0.5 K/uL   Basophils Relative 1 %   Basophils Absolute 0.0 0.0 - 0.1 K/uL   Immature Granulocytes 0 %   Abs Immature Granulocytes 0.02 0.00 - 0.07 K/uL    Comment: Performed at [facility], [address] [place], [place] [id number]  Basic metabolic panel     Status: Abnormal   Collection Time: 04/04/22 11:00 AM  Result Value Ref Range   Sodium 137 135 - 145 mmol/L   Potassium 3.5 3.5 - 5.1 mmol/L   Chloride 104 98 - 111 mmol/L   CO2 20 (L) 22 - 32 mmol/L   Glucose, Bld 101 (H) 70 - 99 mg/dL    Comment: Glucose reference range applies only to samples taken after fasting for at least 8 hours.   BUN 54 (H) 8 - 23 mg/dL   Creatinine, Ser 2.86 (H) 0.44 - 1.00 mg/dL   Calcium 9.4 8.9 - 10.3 mg/dL   GFR, Estimated 16 (L) >60 mL/min    Comment: (NOTE) Calculated using the CKD-EPI Creatinine Equation (2021)    Anion gap 13 5 - 15    Comment: Performed at [facility] [address], [place], [place] [address]  Brain natriuretic peptide     Status: Abnormal   Collection Time: 04/04/22 11:00 AM  Result Value Ref Range   B Natriuretic Peptide 360.6 (H) 0.0 - 100.0 pg/mL    Comment: Performed at [facility] [address] [place], [place] [address]  Troponin I (High Sensitivity)     Status: Abnormal   Collection Time: 04/04/22 11:00 AM  Result Value Ref Range   Troponin I (High Sensitivity) 20 (H) <18 ng/L    Comment: (NOTE) Elevated high sensitivity troponin I (hsTnI) values and significant  changes across serial measurements may suggest ACS but many other  chronic and acute conditions are known to elevate hsTnI results.  Refer to the "Links"  section for chest pain algorithms and additional  guidance. Performed at [facility], [place] [address] [place], [place] [address]   Protime-INR     Status: Abnormal   Collection Time: 04/04/22 11:00 AM  Result Value Ref Range   Prothrombin  Time 27.4 (H) 11.4 - 15.2 seconds   INR 2.6 (H) 0.8 - 1.2    Comment: (NOTE) INR goal varies based on device and disease states. Performed at [facility], [place] [address] [place], [place] [address]   Troponin I (High Sensitivity)     Status: Abnormal   Collection Time: 04/04/22  1:02 PM  Result Value Ref Range   Troponin I (High Sensitivity) 26 (H) <18 ng/L    Comment: (NOTE) Elevated high sensitivity troponin I (hsTnI) values and significant  changes across serial measurements may suggest ACS but many other  chronic and acute conditions are known to elevate hsTnI results.  Refer to the "Links" section for chest pain algorithms and additional  guidance. Performed at [facility], [place] [address] [place], [place] [address]    DG Chest Port 1 View  Result Date: 04/04/2022 CLINICAL DATA:  Chest pain EXAM: PORTABLE CHEST 1 VIEW COMPARISON:  03/21/2021 FINDINGS: Stable cardiac enlargement previous median sternotomy. Aortic atherosclerosis. There is no pleural effusion or interstitial edema. No airspace consolidation identified. IMPRESSION: No acute cardiopulmonary abnormalities. Electronically Signed   By: Kerby Moors M.D.   On: 04/04/2022 12:01    Pending Labs Unresulted Labs (From admission, onward)     Start     Ordered   04/05/22 [id number]  Basic metabolic panel  Tomorrow morning,   R        04/04/22 1624   04/05/22 0500  CBC  Tomorrow morning,   R        04/04/22 1624            Vitals/Pain Today's Vitals   04/04/22 1230 04/04/22 1453 04/04/22 1500 04/04/22 1527  BP: (!) 176/99  (!) 175/109   Pulse: 70  70   Resp: 16  14   Temp:    98 F (36.7 C)  TempSrc:    Oral  SpO2: 100%  100%   Weight:      Height:       PainSc:  0-No pain      Isolation Precautions No active isolations  Medications Medications  amiodarone (PACERONE) tablet 200 mg (has no administration in time range)  furosemide (LASIX) tablet 40 mg (has no administration in time range)  hydrALAZINE (APRESOLINE) tablet 12.5 mg (has no administration in time range)  isosorbide mononitrate (IMDUR) 24 hr tablet 15 mg (has no administration in time range)  metoprolol succinate (TOPROL-XL) 24 hr tablet 12.5 mg (has no administration in time range)  pantoprazole (PROTONIX) EC tablet 40 mg (has no administration in time range)  tamsulosin (FLOMAX) capsule 0.4 mg (has no administration in time range)  acetaminophen (TYLENOL) tablet 650 mg (has no administration in time range)    Or  acetaminophen (TYLENOL) suppository 650 mg (has no administration in time range)  polyethylene glycol (MIRALAX / GLYCOLAX) packet 17 g (has no administration in time range)  metoprolol tartrate (LOPRESSOR) injection 5 mg (has no administration in time range)    Mobility walks with device     Focused Assessments Pulmonary Assessment Handoff:  Lung sounds:          R Recommendations: See Admitting Provider Note  Report given to:   Additional Notes:

## 2022-04-05 ENCOUNTER — Observation Stay (HOSPITAL_BASED_OUTPATIENT_CLINIC_OR_DEPARTMENT_OTHER): Payer: 59

## 2022-04-05 ENCOUNTER — Observation Stay (HOSPITAL_COMMUNITY): Payer: 59

## 2022-04-05 DIAGNOSIS — I482 Chronic atrial fibrillation, unspecified: Secondary | ICD-10-CM | POA: Diagnosis not present

## 2022-04-05 DIAGNOSIS — Z87891 Personal history of nicotine dependence: Secondary | ICD-10-CM | POA: Diagnosis not present

## 2022-04-05 DIAGNOSIS — Z7901 Long term (current) use of anticoagulants: Secondary | ICD-10-CM | POA: Diagnosis not present

## 2022-04-05 DIAGNOSIS — Z8249 Family history of ischemic heart disease and other diseases of the circulatory system: Secondary | ICD-10-CM | POA: Diagnosis not present

## 2022-04-05 DIAGNOSIS — S199XXA Unspecified injury of neck, initial encounter: Secondary | ICD-10-CM | POA: Diagnosis not present

## 2022-04-05 DIAGNOSIS — M25519 Pain in unspecified shoulder: Secondary | ICD-10-CM | POA: Diagnosis not present

## 2022-04-05 DIAGNOSIS — I4821 Permanent atrial fibrillation: Secondary | ICD-10-CM | POA: Diagnosis not present

## 2022-04-05 DIAGNOSIS — N189 Chronic kidney disease, unspecified: Secondary | ICD-10-CM | POA: Diagnosis not present

## 2022-04-05 DIAGNOSIS — J9811 Atelectasis: Secondary | ICD-10-CM | POA: Diagnosis not present

## 2022-04-05 DIAGNOSIS — I10 Essential (primary) hypertension: Secondary | ICD-10-CM | POA: Diagnosis not present

## 2022-04-05 DIAGNOSIS — I5042 Chronic combined systolic (congestive) and diastolic (congestive) heart failure: Secondary | ICD-10-CM | POA: Diagnosis not present

## 2022-04-05 DIAGNOSIS — Z952 Presence of prosthetic heart valve: Secondary | ICD-10-CM | POA: Diagnosis not present

## 2022-04-05 DIAGNOSIS — Z821 Family history of blindness and visual loss: Secondary | ICD-10-CM | POA: Diagnosis not present

## 2022-04-05 DIAGNOSIS — R079 Chest pain, unspecified: Secondary | ICD-10-CM | POA: Diagnosis not present

## 2022-04-05 DIAGNOSIS — K219 Gastro-esophageal reflux disease without esophagitis: Secondary | ICD-10-CM | POA: Diagnosis not present

## 2022-04-05 DIAGNOSIS — S7291XA Unspecified fracture of right femur, initial encounter for closed fracture: Secondary | ICD-10-CM | POA: Diagnosis not present

## 2022-04-05 DIAGNOSIS — R9431 Abnormal electrocardiogram [ECG] [EKG]: Secondary | ICD-10-CM | POA: Diagnosis not present

## 2022-04-05 DIAGNOSIS — Z043 Encounter for examination and observation following other accident: Secondary | ICD-10-CM | POA: Diagnosis not present

## 2022-04-05 DIAGNOSIS — J45909 Unspecified asthma, uncomplicated: Secondary | ICD-10-CM | POA: Diagnosis not present

## 2022-04-05 DIAGNOSIS — Z743 Need for continuous supervision: Secondary | ICD-10-CM | POA: Diagnosis not present

## 2022-04-05 DIAGNOSIS — Z79899 Other long term (current) drug therapy: Secondary | ICD-10-CM | POA: Diagnosis not present

## 2022-04-05 DIAGNOSIS — M810 Age-related osteoporosis without current pathological fracture: Secondary | ICD-10-CM | POA: Diagnosis not present

## 2022-04-05 DIAGNOSIS — I5022 Chronic systolic (congestive) heart failure: Secondary | ICD-10-CM | POA: Diagnosis not present

## 2022-04-05 DIAGNOSIS — S72011A Unspecified intracapsular fracture of right femur, initial encounter for closed fracture: Secondary | ICD-10-CM | POA: Diagnosis not present

## 2022-04-05 DIAGNOSIS — I059 Rheumatic mitral valve disease, unspecified: Secondary | ICD-10-CM | POA: Diagnosis not present

## 2022-04-05 DIAGNOSIS — Z888 Allergy status to other drugs, medicaments and biological substances status: Secondary | ICD-10-CM | POA: Diagnosis not present

## 2022-04-05 DIAGNOSIS — N179 Acute kidney failure, unspecified: Secondary | ICD-10-CM | POA: Diagnosis not present

## 2022-04-05 DIAGNOSIS — Z9049 Acquired absence of other specified parts of digestive tract: Secondary | ICD-10-CM | POA: Diagnosis not present

## 2022-04-05 DIAGNOSIS — S8991XA Unspecified injury of right lower leg, initial encounter: Secondary | ICD-10-CM | POA: Diagnosis not present

## 2022-04-05 DIAGNOSIS — I130 Hypertensive heart and chronic kidney disease with heart failure and stage 1 through stage 4 chronic kidney disease, or unspecified chronic kidney disease: Secondary | ICD-10-CM | POA: Diagnosis not present

## 2022-04-05 DIAGNOSIS — M79604 Pain in right leg: Secondary | ICD-10-CM | POA: Diagnosis not present

## 2022-04-05 DIAGNOSIS — R6889 Other general symptoms and signs: Secondary | ICD-10-CM | POA: Diagnosis not present

## 2022-04-05 DIAGNOSIS — E860 Dehydration: Secondary | ICD-10-CM | POA: Diagnosis not present

## 2022-04-05 DIAGNOSIS — N320 Bladder-neck obstruction: Secondary | ICD-10-CM | POA: Diagnosis not present

## 2022-04-05 DIAGNOSIS — R0789 Other chest pain: Secondary | ICD-10-CM | POA: Diagnosis not present

## 2022-04-05 DIAGNOSIS — I420 Dilated cardiomyopathy: Secondary | ICD-10-CM | POA: Diagnosis not present

## 2022-04-05 DIAGNOSIS — S42031A Displaced fracture of lateral end of right clavicle, initial encounter for closed fracture: Secondary | ICD-10-CM | POA: Diagnosis not present

## 2022-04-05 DIAGNOSIS — S72001A Fracture of unspecified part of neck of right femur, initial encounter for closed fracture: Secondary | ICD-10-CM | POA: Diagnosis not present

## 2022-04-05 DIAGNOSIS — N184 Chronic kidney disease, stage 4 (severe): Secondary | ICD-10-CM | POA: Diagnosis not present

## 2022-04-05 DIAGNOSIS — S42034A Nondisplaced fracture of lateral end of right clavicle, initial encounter for closed fracture: Secondary | ICD-10-CM | POA: Diagnosis not present

## 2022-04-05 DIAGNOSIS — Z91041 Radiographic dye allergy status: Secondary | ICD-10-CM | POA: Diagnosis not present

## 2022-04-05 DIAGNOSIS — Z602 Problems related to living alone: Secondary | ICD-10-CM | POA: Diagnosis not present

## 2022-04-05 DIAGNOSIS — S022XXA Fracture of nasal bones, initial encounter for closed fracture: Secondary | ICD-10-CM | POA: Diagnosis not present

## 2022-04-05 DIAGNOSIS — I350 Nonrheumatic aortic (valve) stenosis: Secondary | ICD-10-CM | POA: Diagnosis not present

## 2022-04-05 LAB — ECHOCARDIOGRAM COMPLETE
AR max vel: 1 cm2
AV Area VTI: 0.94 cm2
AV Area mean vel: 0.97 cm2
AV Mean grad: 14.4 mmHg
AV Peak grad: 23.1 mmHg
Ao pk vel: 2.4 m/s
Area-P 1/2: 2.65 cm2
Est EF: 55
Height: 68 in
MV VTI: 2.05 cm2
S' Lateral: 3.4 cm
Weight: 2278.67 oz

## 2022-04-05 LAB — BASIC METABOLIC PANEL
Anion gap: 16 — ABNORMAL HIGH (ref 5–15)
BUN: 43 mg/dL — ABNORMAL HIGH (ref 8–23)
CO2: 21 mmol/L — ABNORMAL LOW (ref 22–32)
Calcium: 9.8 mg/dL (ref 8.9–10.3)
Chloride: 102 mmol/L (ref 98–111)
Creatinine, Ser: 2.3 mg/dL — ABNORMAL HIGH (ref 0.44–1.00)
GFR, Estimated: 20 mL/min — ABNORMAL LOW (ref >60)
Glucose, Bld: 92 mg/dL (ref 70–99)
Potassium: 3.6 mmol/L (ref 3.5–5.1)
Sodium: 139 mmol/L (ref 135–145)

## 2022-04-05 LAB — TROPONIN I (HIGH SENSITIVITY)
Troponin I (High Sensitivity): 29 ng/L — ABNORMAL HIGH (ref <18)
Troponin I (High Sensitivity): 30 ng/L — ABNORMAL HIGH (ref <18)

## 2022-04-05 LAB — CBC
HCT: 39.6 % (ref 36.0–46.0)
Hemoglobin: 12.6 g/dL (ref 12.0–15.0)
MCH: 27.9 pg (ref 26.0–34.0)
MCHC: 31.8 g/dL (ref 30.0–36.0)
MCV: 87.8 fL (ref 80.0–100.0)
Platelets: 173 10*3/uL (ref 150–400)
RBC: 4.51 MIL/uL (ref 3.87–5.11)
RDW: 15.8 % — ABNORMAL HIGH (ref 11.5–15.5)
WBC: 6.6 10*3/uL (ref 4.0–10.5)
nRBC: 0 % (ref 0.0–0.2)

## 2022-04-05 LAB — PROTIME-INR
INR: 2.4 — ABNORMAL HIGH (ref 0.8–1.2)
Prothrombin Time: 25.5 seconds — ABNORMAL HIGH (ref 11.4–15.2)

## 2022-04-05 MED ORDER — PERFLUTREN LIPID MICROSPHERE
1.0000 mL | INTRAVENOUS | Status: AC | PRN
  Administered 2022-04-05: 1.5 mL via INTRAVENOUS

## 2022-04-05 MED ORDER — AMIODARONE HCL 200 MG PO TABS
200.0000 mg | ORAL_TABLET | Freq: Every day | ORAL | Status: DC
  Administered 2022-04-05: 200 mg via ORAL
  Filled 2022-04-05: qty 1

## 2022-04-05 MED ORDER — WARFARIN SODIUM 5 MG PO TABS
7.0000 mg | ORAL_TABLET | Freq: Once | ORAL | Status: AC
  Administered 2022-04-05: 7 mg via ORAL
  Filled 2022-04-05: qty 1

## 2022-04-05 MED ORDER — METOPROLOL SUCCINATE ER 100 MG PO TB24: 50.0000 mg | ORAL_TABLET | Freq: Two times a day (BID) | ORAL | 1 refills | Status: DC

## 2022-04-05 NOTE — Discharge Instructions (Signed)
Dear [name],   Thank you for letting us participate in your care! In this section, you will find a brief hospital admission summary of why you were admitted to the hospital, what happened during your admission, your diagnosis/diagnoses, and recommended follow up.  Primary diagnosis: Chest pain Treatment plan: Cardiology was consulted and reviewed your heart ultrasound and rhythm and determined there is likely not an underlying cardiac concern for your chest pain. Please continue your medications as prescribed and follow up with your PCP and Cardiologist soon.  POST-HOSPITAL & CARE INSTRUCTIONS We recommend following up with your PCP within 1 week from being discharged from the hospital. Please let PCP/Specialists know of any changes in medications that were made which you will be able to see in the medications section of this packet. Please also follow up cardiology outpatient  [facility] Future Appointments  Date Time Provider [place]  04/16/2022  8:30 AM Dickie La, MD [facility]  04/28/2022  8:45 AM [facility] None  06/25/2022  9:00 AM Marzetta Board, DPM [facility]     Thank you for choosing [facility]! Take care and be well!  [facility]  [place], [place] [address] [phone]

## 2022-04-05 NOTE — Discharge Summary (Signed)
[facility] Discharge Summary  [name] Medical record number: [id number] Date of birth: March 18, 1935 Age: 87 y.o. Gender: female Date of Admission: 04/04/2022  Date of Discharge: 04/05/2022 Admitting Physician: Lenoria Chime, MD  Primary Care Provider: Dickie La, MD Consultants: Cardiology  Indication for Hospitalization: Chest pain  Discharge Diagnoses/Problem List:  Principal Problem for Admission: Chest pain Other Problems addressed during stay:  Principal Problem:   Chest pain Active Problems:   Hypertension   Abnormal EKG  Brief Hospital Course:  [name] is a 87 y.o. female presenting with acute onset exertional chest pain and SOB. Past medical history is significant for Chronic A-fib, dilated cardiomyopathy, GERD, GI bleed, hemorrhoid, hypertension, microcytic anemia, osteoporosis, right bundle branch block, rheumatic mitral valve disease.  Her hospital course is outlined below:  Chest pain Prolonged Qtc Hypertension Admitted for R sided chest pain/pressure that radiated to the R arm. Hypertensive in the ED but patient had not taken her meds. Otherwise normal vitals and labs consistent with baseline. EKG on admission showed QTc of 588. Due to this her home amiodarone was held per pharmacy recommendations. Per Cardiology QT interval likely falsely prolonged in the setting of RBBB and corrected to close to normal. Echo showed no acute changes and patient cleared from Cardiology standpoint. Cardiology recommended stopping Amiodarone and changing Metoprolol to 100mg  in AM and 50mg  in PM upon discharge.  PCP follow-up: Stopped Amiodarone, changed to Metoprolol 100mg  AM and 50mg  PM, HR check Repeat EKG given borderline QT prolongation   Disposition: Home with [facility]  Discharge Condition: Stable   Discharge Exam:  Vitals:   04/05/22 1204 04/05/22 1541  BP:  134/81  Pulse:  76  Resp:  13  Temp: 98.1 F (36.7 C) 97.6  F (36.4 C)  SpO2:  96%   General: Elderly female sitting up in bed, alert Cardiovascular: Irregularly irregular rhythm. Loud systolic murmur Respiratory: CTAB. Normal WOB on RA Abdomen: Soft, non-tender, non-distended Extremities: No peripheral edema  Significant Procedures: None  Significant Labs and Imaging:  Recent Labs  Lab 04/04/22 1100 04/04/22 2348  WBC 5.8 6.6  HGB 12.0 12.6  HCT 39.1 39.6  PLT 188 173   Recent Labs  Lab 04/04/22 1100 04/04/22 2348  NA 137 139  K 3.5 3.6  CL 104 102  CO2 20* 21*  GLUCOSE 101* 92  BUN 54* 43*  CREATININE 2.86* 2.30*  CALCIUM 9.4 9.8    Pertinent Imaging: ECHOCARDIOGRAM COMPLETE Result Date: 04/05/2022 IMPRESSIONS  1. Left ventricular ejection fraction, by estimation, is 55%. The left ventricle has normal function. The left ventricle has no regional wall motion abnormalities. There is moderate left ventricular hypertrophy. Left ventricular diastolic function could  not be evaluated.  2. Right ventricular systolic function is moderately reduced. The right ventricular size is moderately enlarged. There is mildly elevated pulmonary artery systolic pressure. The estimated right ventricular systolic pressure is [id number] mmHg.  3. Left atrial size was massively dilated.  4. Right atrial size was severely dilated.  5. The mitral valve has been repaired/replaced. Trivial mitral valve regurgitation. The mean mitral valve gradient is 2.0 mmHg with average heart rate of 65 bpm. There is a unknown size St. Jude mechanical valve present in the mitral position. Procedure  Date: 62. Echo findings are consistent with normal structure and function of the mitral valve prosthesis.  6. The aortic valve is abnormal. There is mild calcification of the aortic valve. Aortic valve regurgitation  is mild. Mild to moderate aortic valve stenosis. Aortic valve mean gradient measures 14.4 mmHg. Aortic valve Vmax measures 2.40 m/s. DVI 0.30.  7. The inferior vena cava is  dilated in size with >50% respiratory variability, suggesting right atrial pressure of 8 mmHg.  CT CHEST WO CONTRAST Result Date: 04/05/2022 IMPRESSION: 1. Aortic atherosclerosis without evidence of aneurysm. 2. Markedly distended pulmonary trunk suggesting underlying pulmonary artery hypertension. 3. Cardiomegaly with coronary artery calcifications. 4. Morphologic changes of cirrhosis. 5. Nonobstructive left renal calculus.   Results/Tests Pending at Time of Discharge: None  Discharge Medications:  Allergies as of 04/05/2022       Reactions   Iodinated Contrast Media Rash   Pt stated in the past had broken out in a rash on back and itching.    Esomeprazole Magnesium    REACTION: stomach upset, nausea        Medication List     STOP taking these medications    amiodarone 200 MG tablet Commonly known as: Pacerone       TAKE these medications    dorzolamidel-timolol 22.3-6.8 MG/ML Soln ophthalmic solution Commonly known as: COSOPT Place 1 drop into both eyes in the morning and at bedtime.   furosemide 40 MG tablet Commonly known as: LASIX Take 1 tablet (40 mg total) by mouth daily.   hydrALAZINE 25 MG tablet Commonly known as: APRESOLINE TAKE 1/2 TABLET BY MOUTH THREE TIMES DAILY WITH FOOD What changed:  how much to take how to take this when to take this additional instructions   isosorbide mononitrate 30 MG 24 hr tablet Commonly known as: IMDUR Take one half tablet by mouth daily What changed:  how much to take how to take this when to take this additional instructions   [place] 0.01 % Soln Generic drug: bimatoprost Place 1 drop into both eyes at bedtime.   metoprolol succinate 100 MG 24 hr tablet Commonly known as: TOPROL-XL Take 0.5-1 tablets (50-100 mg total) by mouth 2 (two) times daily. Please take 1 tablet (100mg ) in the morning and 0.5 tablet (50mg ) in the evening. What changed:  how much to take how to take this when to take this additional  instructions   pantoprazole 40 MG tablet Commonly known as: PROTONIX TAKE 1 TABLET(40 MG) BY MOUTH DAILY What changed: See the new instructions.   polyethylene glycol 17 g packet Commonly known as: MIRALAX / GLYCOLAX Take 17 g by mouth daily as needed for mild constipation.   tamsulosin 0.4 MG Caps capsule Commonly known as: FLOMAX TAKE 1 CAPSULE(0.4 MG) BY MOUTH DAILY What changed: See the new instructions.   Vitamin D 50 MCG (2000 UT) tablet Take 1 tablet (2,000 Units total) by mouth daily.   warfarin 10 MG tablet Commonly known as: COUMADIN Take as directed. If you are unsure how to take this medication, talk to your nurse or doctor. Original instructions: TAKE 1/2 TO 1 TABLET BY MOUTH EVERY DAY AS DIRECTED BY COAGULATION CLINIC What changed:  how much to take how to take this when to take this additional instructions        Discharge Instructions: Please refer to Patient Instructions section of EMR for full details.  Patient was counseled important signs and symptoms that should prompt return to medical care, changes in medications, dietary instructions, activity restrictions, and follow up appointments.   Follow-Up Appointments:  Follow-up Information     Dickie La, MD Follow up.   Specialties: Family Medicine, Sports Medicine Why: 3/27 @ 8:30AM  Contact information: [address] [place] [address] [phone]                 Colletta Maryland, MD 04/05/2022, 4:28 PM PGY-1, [place]

## 2022-04-05 NOTE — TOC Transition Note (Signed)
Transition of Care [facility]) - CM/SW Discharge Note   Patient Details  Name: [name] MRN: [id number] Date of Birth: 08-04-1935  Transition of [facility] CM/SW Contact:  Carles Collet, RN Phone Number: 04/05/2022, 4:30 PM   Clinical Narrative:     Damaris Schooner w patient over the phone. She is agreeable to [facility] services. She had preference for [facility], however they do not accept her insurance. Referral made to [facility] who will accept. No other TOC needs identified at this time. Patient states her granddaughter will provide transportation home   Final next level of care: [facility] Barriers to Discharge: No Barriers Identified   Patient Goals and CMS Choice CMS Medicare.gov Compare Post Acute Care list provided to:: Patient Represenative (must comment) Choice offered to / list presented to : Patient  Discharge Placement                         Discharge Plan and Services Additional resources added to the After Visit Summary for                  DME Arranged: N/A         HH Arranged: PT, OT HH Agency: [place] Date [place]: 04/05/22 Time HH Agency Contacted: 62 Representative spoke with at [place]: [place] Determinants of Health ([place]) Interventions SDOH Screenings   Food Insecurity: No Food Insecurity (10/07/2018)  Transportation Needs: No Transportation Needs (10/07/2018)  Depression (PHQ2-9): Low Risk  (09/26/2021)  Financial Resource Strain: Low Risk  (10/07/2018)  Physical Activity: Insufficiently Active (10/07/2018)  Social Connections: Moderately Isolated (10/07/2018)  Stress: No Stress Concern Present (10/07/2018)  Tobacco Use: Medium Risk (04/04/2022)     Readmission Risk Interventions     No data to display

## 2022-04-05 NOTE — Progress Notes (Signed)
No further complain of feeling nauseous, claimed feeling much better.

## 2022-04-05 NOTE — Evaluation (Signed)
Physical Therapy Evaluation Patient Details Name: [name] MRN: [id number] DOB: [birth year] Today's Date: 04/05/2022  History of Present Illness  87 yo female presenting 3/15 with chest pain that radiates to back and L arm and SOB with exertion. Work up shows RBBB. PMH includes: dilated CM, HFrEF, AVR on Coumadin, chronic Afib.   Clinical Impression  Pt in bed upon arrival of PT, agreeable to evaluation at this time. Prior to admission the pt was living alone in senior living apartment, ambulating with use of a cane, and relying on family for transportation. The pt now presents with limitations in functional mobility, power, endurance, and dynamic stability due to above dx, and will continue to benefit from skilled PT to address these deficits. The pt was able to complete bed mobility without assist, but required minA for all OOB mobility, as pt with posterior LOB on initial stand and multiple minor LOB with hallway ambulation despite use of RW. The pt was also limited in endurance, requiring standing rest every 50 ft. Pt with decreased verbal responsiveness after hallway ambulation, returned to sitting for safety where pt began conversing more again and states she was just tired. Given need for assist to balance both with initial sit-stand and with ambulation, recommend d/c home with HHPT only if pt's daughter can provide increased assist and supervision (the pt states she is able to, but daughter not present to confirm). Will continue to benefit from skilled PT to progress endurance and stability.      Recommendations for follow up therapy are one component of a multi-disciplinary discharge planning process, led by the attending physician.  Recommendations may be updated based on patient status, additional functional criteria and insurance authorization.  Follow Up Recommendations Home health PT      Assistance Recommended at Discharge Frequent or constant Supervision/Assistance  Patient can  return home with the following  A little help with walking and/or transfers;A little help with bathing/dressing/bathroom;Assistance with cooking/housework;Direct supervision/assist for medications management;Direct supervision/assist for financial management;Assist for transportation;Help with stairs or ramp for entrance    Equipment Recommendations None recommended by PT  Recommendations for Other Services       Functional Status Assessment Patient has had a recent decline in their functional status and demonstrates the ability to make significant improvements in function in a reasonable and predictable amount of time.     Precautions / Restrictions Precautions Precautions: Fall Precaution Comments: HOH Restrictions Weight Bearing Restrictions: No      Mobility  Bed Mobility Overal bed mobility: Needs Assistance Bed Mobility: Supine to Sit     Supine to sit: Min guard     General bed mobility comments: pt using bed rails, no assist needed to get to EOB    Transfers Overall transfer level: Needs assistance Equipment used: None Transfers: Sit to/from Stand Sit to Stand: Min assist           General transfer comment: pt with posterior LOB with initial stand from EOB    Ambulation/Gait Ambulation/Gait assistance: Min assist Gait Distance (Feet): 150 Feet (standing rest after each 50 ft) Assistive device: Rolling walker (2 wheels) Gait Pattern/deviations: Step-through pattern, Decreased stride length, Trunk flexed Gait velocity: decreased Gait velocity interpretation: <1.31 ft/sec, indicative of household ambulator   General Gait Details: small steps with minA to manage RW and balance. pt with multiple minor LOB and then asking for standing rest. reports SOB but SpO2 > 90% with good reading.     Balance Overall balance assessment: Needs  assistance Sitting-balance support: No upper extremity supported, Feet supported Sitting balance-Leahy Scale: Good      Standing balance support: Bilateral upper extremity supported, During functional activity, Reliant on assistive device for balance Standing balance-Leahy Scale: Poor                               Pertinent Vitals/Pain Pain Assessment Pain Assessment: No/denies pain    Home Living Family/patient expects to be discharged to:: Private residence Living Arrangements: Alone Available Help at Discharge: Family;Available 24 hours/day Type of Home: Apartment Home Access: Level entry       Home Layout: One level Home Equipment: Rollator (4 wheels);Cane - single point;Shower seat Additional Comments: senior living apt    Prior Function Prior Level of Function : Independent/Modified Independent             Mobility Comments: pt reports use of SPC without LOB ADLs Comments: pt reports not driving, but is otherwise independent with ADLs     Hand Dominance        Extremity/Trunk Assessment   Upper Extremity Assessment Upper Extremity Assessment: Defer to OT evaluation    Lower Extremity Assessment Lower Extremity Assessment: Generalized weakness (grossly 3+/5 with no focal deficits)    Cervical / Trunk Assessment Cervical / Trunk Assessment: Kyphotic  Communication   Communication: HOH  Cognition Arousal/Alertness: Awake/alert Behavior During Therapy: WFL for tasks assessed/performed Overall Cognitive Status: Difficult to assess                                 General Comments: limited by [facility] at times, pt slow to respond or needing cues repeated. following simple cues with increased time        General Comments General comments (skin integrity, edema, etc.): VSS on RA        Assessment/Plan    PT Assessment Patient needs continued PT services  PT Problem List Decreased strength;Decreased activity tolerance;Decreased balance       PT Treatment Interventions DME instruction;Gait training;Functional mobility training;Therapeutic  activities;Therapeutic exercise;Balance training;Patient/family education    PT Goals (Current goals can be found in the Care Plan section)  Acute Rehab PT Goals Patient Stated Goal: return home PT Goal Formulation: With patient Time For Goal Achievement: 04/19/22 Potential to Achieve Goals: Good    Frequency Min 3X/week     Co-evaluation PT/OT/SLP Co-Evaluation/Treatment: Yes Reason for Co-Treatment: For patient/therapist safety;To address functional/ADL transfers PT goals addressed during session: Mobility/safety with mobility;Balance;Proper use of DME         AM-PAC PT "6 Clicks" Mobility  Outcome Measure Help needed turning from your back to your side while in a flat bed without using bedrails?: None Help needed moving from lying on your back to sitting on the side of a flat bed without using bedrails?: None Help needed moving to and from a bed to a chair (including a wheelchair)?: A Little Help needed standing up from a chair using your arms (e.g., wheelchair or bedside chair)?: A Little Help needed to walk in hospital room?: A Little Help needed climbing 3-5 steps with a railing? : A Lot 6 Click Score: 19    End of Session Equipment Utilized During Treatment: Gait belt Activity Tolerance: Patient limited by fatigue Patient left: in chair;with call bell/phone within reach;with chair alarm set Nurse Communication: Mobility status PT Visit Diagnosis: Unsteadiness on feet (R26.81);Muscle weakness (generalized) (  M62.81)    [id number] PT Time Calculation (min) (ACUTE ONLY): 22 min   Charges:   PT Evaluation $PT Eval Low Complexity: 1 Low          West Carbo, PT, DPT   Acute Rehabilitation Department Office [phone] Secure Chat Communication Preferred  [name] 04/05/2022, 12:15 PM

## 2022-04-05 NOTE — Evaluation (Signed)
Occupational Therapy Evaluation Patient Details Name: [name] MRN: [id number] DOB: 28-Oct-1935 Today's Date: 04/05/2022   History of Present Illness 87 yo female presenting 3/15 with chest pain that radiates to back and L arm and SOB with exertion. Work up shows RBBB. PMH includes: dilated CM, HFrEF, AVR on Coumadin, chronic Afib.   Clinical Impression   Pt admitted for concerns listed above. PTA pt reported that she was independent with all aDL's and IADL's, except for driving, which her daughter assists with all transportation needs. At this time, pt presenting with decreased activity tolerance, requiring increased assist for functional mobility. She stated that she felt short of breath, despite respiratory rate remaining the same. After hallway ambulation, pt became minimally verbally responsive, with no change in cardiac monitor, other than Left lead falling off. Once seated pt returned back to baseline responsiveness. OT recommending HHOT as long as pt's daughter is able to stay with pt until she gets her strength and balance improved. OT will continue to follow acutely.      Recommendations for follow up therapy are one component of a multi-disciplinary discharge planning process, led by the attending physician.  Recommendations may be updated based on patient status, additional functional criteria and insurance authorization.   Follow Up Recommendations  Home health OT     Assistance Recommended at Discharge Intermittent Supervision/Assistance  Patient can return home with the following A little help with walking and/or transfers;A little help with bathing/dressing/bathroom;Assistance with cooking/housework;Help with stairs or ramp for entrance    Functional Status Assessment  Patient has had a recent decline in their functional status and demonstrates the ability to make significant improvements in function in a reasonable and predictable amount of time.  Equipment  Recommendations  None recommended by OT    Recommendations for Other Services       Precautions / Restrictions Precautions Precautions: Fall Precaution Comments: HOH Restrictions Weight Bearing Restrictions: No      Mobility Bed Mobility Overal bed mobility: Needs Assistance Bed Mobility: Supine to Sit     Supine to sit: Min guard     General bed mobility comments: pt using bed rails, no assist needed to get to EOB    Transfers Overall transfer level: Needs assistance Equipment used: None Transfers: Sit to/from Stand Sit to Stand: Min assist           General transfer comment: pt with posterior LOB with initial stand from EOB      Balance Overall balance assessment: Needs assistance Sitting-balance support: No upper extremity supported, Feet supported Sitting balance-Leahy Scale: Good     Standing balance support: Bilateral upper extremity supported, During functional activity, Reliant on assistive device for balance Standing balance-Leahy Scale: Poor                             ADL either performed or assessed with clinical judgement   ADL Overall ADL's : Needs assistance/impaired Eating/Feeding: Set up;Sitting   Grooming: Min guard;Standing   Upper Body Bathing: Min guard;Sitting   Lower Body Bathing: Minimal assistance;Sitting/lateral leans;Sit to/from stand   Upper Body Dressing : Min guard;Sitting   Lower Body Dressing: Minimal assistance;Sitting/lateral leans;Sit to/from stand   Toilet Transfer: Minimal assistance;+2 for safety/equipment;Ambulation   Toileting- Clothing Manipulation and Hygiene: Min guard;Sitting/lateral lean;Sit to/from stand       Functional mobility during ADLs: Minimal assistance;Rolling walker (2 wheels);+2 for safety/equipment General ADL Comments: Pt able to complete most ADL's  in sitting, however pt presenting with poor activity tolerance and requiring assist once standing for 5+ mins     Vision  Baseline Vision/History: 1 Wears glasses Ability to See in Adequate Light: 0 Adequate Patient Visual Report: No change from baseline Vision Assessment?: No apparent visual deficits     Perception Perception Perception Tested?: No   Praxis Praxis Praxis tested?: Not tested    Pertinent Vitals/Pain Pain Assessment Pain Assessment: No/denies pain     Hand Dominance Right   Extremity/Trunk Assessment Upper Extremity Assessment Upper Extremity Assessment: Generalized weakness   Lower Extremity Assessment Lower Extremity Assessment: Defer to PT evaluation   Cervical / Trunk Assessment Cervical / Trunk Assessment: Kyphotic   Communication Communication Communication: HOH   Cognition Arousal/Alertness: Awake/alert Behavior During Therapy: WFL for tasks assessed/performed Overall Cognitive Status: Difficult to assess                                 General Comments: limited by [facility] at times, pt slow to respond or needing cues repeated. following simple cues with increased time     General Comments  VSS on RA    Exercises     Shoulder Instructions      Home Living Family/patient expects to be discharged to:: Private residence Living Arrangements: Alone Available Help at Discharge: Family;Available 24 hours/day Type of Home: Apartment Home Access: Level entry     Home Layout: One level     Bathroom Shower/Tub: Occupational psychologist: Handicapped height Bathroom Accessibility: Yes How Accessible: Accessible via walker Home Equipment: Rollator (4 wheels);Cane - single point;Shower seat   Additional Comments: senior living apt      Prior Functioning/Environment Prior Level of Function : Independent/Modified Independent             Mobility Comments: pt reports use of SPC without LOB ADLs Comments: pt reports not driving, but is otherwise independent with ADLs        OT Problem List: Decreased strength;Decreased activity  tolerance;Impaired balance (sitting and/or standing);Decreased coordination;Decreased safety awareness;Decreased knowledge of use of DME or AE;Cardiopulmonary status limiting activity      OT Treatment/Interventions: Self-care/ADL training;Therapeutic exercise;Energy conservation;DME and/or AE instruction;Therapeutic activities;Patient/family education;Balance training    OT Goals(Current goals can be found in the care plan section) Acute Rehab OT Goals Patient Stated Goal: To go home OT Goal Formulation: With patient Time For Goal Achievement: 04/19/22 Potential to Achieve Goals: Good ADL Goals Pt Will Perform Grooming: with modified independence;standing Pt Will Perform Lower Body Bathing: with modified independence;sit to/from stand;sitting/lateral leans Pt Will Perform Lower Body Dressing: with modified independence;sit to/from stand;sitting/lateral leans Pt Will Transfer to Toilet: with modified independence;ambulating Pt Will Perform Toileting - Clothing Manipulation and hygiene: with modified independence;sitting/lateral leans;sit to/from stand  OT Frequency: Min 2X/week    Co-evaluation PT/OT/SLP Co-Evaluation/Treatment: Yes Reason for Co-Treatment: For patient/therapist safety;To address functional/ADL transfers PT goals addressed during session: Mobility/safety with mobility;Balance;Proper use of DME OT goals addressed during session: ADL's and self-care;Strengthening/ROM      AM-PAC OT "6 Clicks" Daily Activity     Outcome Measure Help from another person eating meals?: A Little Help from another person taking care of personal grooming?: A Little Help from another person toileting, which includes using toliet, bedpan, or urinal?: A Little Help from another person bathing (including washing, rinsing, drying)?: A Little Help from another person to put on and taking off regular upper body  clothing?: A Little Help from another person to put on and taking off regular lower body  clothing?: A Little 6 Click Score: 18   End of Session Equipment Utilized During Treatment: Gait belt;Rolling walker (2 wheels) Nurse Communication: Mobility status  Activity Tolerance: Patient tolerated treatment well Patient left: in chair;with call bell/phone within reach;with chair alarm set  OT Visit Diagnosis: Unsteadiness on feet (R26.81);Other abnormalities of gait and mobility (R26.89);Muscle weakness (generalized) (M62.81)                Time: [id number] OT Time Calculation (min): 21 min Charges:  OT General Charges $OT Visit: 1 Visit OT Evaluation $OT Eval Moderate Complexity: [place], OTR/L [place] [facility]  Yesenia Locurto Elane Yolanda Bonine 04/05/2022, 12:59 PM

## 2022-04-05 NOTE — Plan of Care (Signed)

## 2022-04-05 NOTE — Progress Notes (Signed)
[name] for warfarin  Indication: atrial fibrillation // mechanical mitral valve   Allergies  Allergen Reactions   Iodinated Contrast Media Rash    Pt stated in the past had broken out in a rash on back and itching.    Esomeprazole Magnesium     REACTION: stomach upset, nausea    Patient Measurements: Height: 5\' 8"  (172.7 cm) Weight: 64.6 kg (142 lb 6.7 oz) IBW/kg (Calculated) : 63.9  Vital Signs: Temp: 97.3 F (36.3 C) (03/16 0334) Temp Source: Oral (03/16 0334) BP: 111/72 (03/16 0334) Pulse Rate: 61 (03/16 0334)  Labs: Recent Labs    04/04/22 1100 04/04/22 1302 04/04/22 2348 04/05/22 0120  HGB 12.0  --  12.6  --   HCT 39.1  --  39.6  --   PLT 188  --  173  --   LABPROT 27.4*  --  25.5*  --   INR 2.6*  --  2.4*  --   CREATININE 2.86*  --  2.30*  --   TROPONINIHS 20* 26* 29* 30*     Estimated Creatinine Clearance: 18.4 mL/min (A) (by C-G formula based on SCr of 2.3 mg/dL (H)).   Medical History: Past Medical History:  Diagnosis Date   Asthma    years ago   Chronic atrial fibrillation ([place])    Dilated cardiomyopathy ([place])    history of this, now resolved   Dysphagia    Hx   Fracture of tibial plateau 05/21/2015   GERD (gastroesophageal reflux disease)    GI bleed    Hemorrhoid 04/2014   bleeding   History of blood transfusion    Hypertension    Microcytic anemia    Nonsustained ventricular tachycardia ([facility])    Osteoporosis    Protein in urine    RBBB (right bundle branch block)    Rheumatic mitral valve disease      Assessment: [name] admitted with CP. Pt with hx mMVR and AFib on warfarin. INR today slightly below goal at 2.4. Noted home amiodarone on hold right now.  Home warfarin dose is 5mg  daily  Goal of Therapy:  INR 2.5-3.5  Monitor platelets by anticoagulation protocol: Yes   Plan:  Warfarin 7mg  x1 Daily INR  Arrie Senate, PharmD, BCPS, [facility] Clinical Pharmacist [phone] Please check  AMION for all [facility] numbers 04/05/2022

## 2022-04-05 NOTE — Progress Notes (Signed)
SATURATION QUALIFICATIONS: (This note is used to comply with regulatory documentation for home oxygen)  Patient Saturations on Room Air at Rest = 95%  Patient Saturations on Room Air while Ambulating = 94%  Patient Saturations on 0 Liters of oxygen while Ambulating = 94%  Please briefly explain why patient needs home oxygen:does not need home oxygen

## 2022-04-05 NOTE — Progress Notes (Signed)
Complaining of feeling nauseous.MD made aware who claimed not to give antiemetic med secondary to QT issue. Provided with ginger ale and cracker. Continue to monitor.

## 2022-04-05 NOTE — Consult Note (Addendum)
Cardiology Consultation   Patient ID: [name] MRN: [id number]; DOB: February 04, 1935  Admit date: 04/04/2022 Date of Consult: 04/05/2022  PCP:  Dickie La, MD   [facility] Providers Cardiologist:  Cristopher Peru, MD  Electrophysiologist:  Cristopher Peru, MD       Patient Profile:   [name] is a 87 y.o. female with a hx of (HFrEF) heart failure with reduced ejection fraction, Non-ischemic cardiomyopathy (prior hx of (HFpEF) heart failure with preserved ejection fraction), permanent atrial fibrillation, valvular heart disease s/p St Jude mechanical MVR in 1987 on chronic Coumadin Rx, Mild to mod aortic stenosis, NSVT, Right Bundle Branch Block, hypertension, chronic kidney disease who is being seen 04/05/2022 for the evaluation of chest pain at the request of Dr. Jerilee Hoh.  History of Present Illness:   [name] was last seen in the office in 04/2021 after an admission for new onset heart failure with reduced EF at 35-40. She had AKI on chronic kidney disease that was due more to obtructive nephropathy than diuresis. GDMT was limited by abnormal renal function. Cardiac cath showed normal coronary arteries.   She was admitted by the teaching service yesterday for the evaluation of chest pain. Her hsTrops have been minimally elevated but flat with not trend. Her EKG does not show any new changes. Echocardiogram is pending. Cardiology has been asked to further evaluate.  She notes R sided chest pain starting 2 days ago while walking in the grocery store. She mainly notes pleuritic chest pain with radiation to her back. She has had some worsening with ambulation but mainly notes increased pain with taking a deep breath. She notes assoc shortness of breath. She has not had diaphoresis, nausea, syncope. She has not had any recent respiratory illnesses or productive cough. She sleeps on 2 pillows chronically. She notes improvement in LE edema recently as well as weight loss. She  notes her pain has moved to her R shoulder blade this morning.    Prior Cardiac Studies [facility] 04/02/21: No CAD; mean PA 36; PCWP 18; CO 4.5, CI 2.4 TTE 03/21/2021: EF 35-40, global HK, moderate LVH, mild reduced RVSF, massive LAE, severe RAE, Saint Jude mechanical MVR with normal function and structure, mild to moderate aortic stenosis (mean 8, V-max 194 cm/s, DI 0.52) TTE 07/25/20: EF 50-55  Data this admission K 3.6, SCr 2.30, Hgb 12.6 INR 2.4 hsTrop 20-26-29-30 CT wo contrast: aortic atherosclerosis, no aneurysm, distended pulmonary trunk suggesting pulmonary HTN, CM w coronary Ca2+, changes of cirrhosis, L renal calculus. CXR: NAD EKG 04/05/22: AFib, HR 61, QRS 208, QTc 543 (corrected for QRS: 456), RBBB, LAFB Echo pending    Past Medical History:  Diagnosis Date   Asthma    years ago   Chronic atrial fibrillation (HCC)    Dilated cardiomyopathy ([place])    history of this, now resolved   Dysphagia    Hx   Fracture of tibial plateau 05/21/2015   GERD (gastroesophageal reflux disease)    GI bleed    Hemorrhoid 04/2014   bleeding   History of blood transfusion    Hypertension    Microcytic anemia    Nonsustained ventricular tachycardia (HCC)    Osteoporosis    Protein in urine    RBBB (right bundle branch block)    Rheumatic mitral valve disease     Past Surgical History:  Procedure Laterality Date   CHOLECYSTECTOMY  2005   COLONOSCOPY     COLONOSCOPY W/ POLYPECTOMY  EYE SURGERY  04/2014   HEMICOLECTOMY  2008   forpolyps   MITOMYCIN C APPLICATION Right [id number]   Procedure: MITOMYCIN C APPLICATION;  Surgeon: Marylynn Pearson, MD;  Location: [facility];  Service: Ophthalmology;  Laterality: Right;   MITRAL VALVE REPLACEMENT  1987   St Jude mechanical valve   RIGHT/LEFT HEART CATH AND CORONARY ANGIOGRAPHY N/A 04/02/2021   Procedure: RIGHT/LEFT HEART CATH AND CORONARY ANGIOGRAPHY;  Surgeon: Jolaine Artist, MD;  Location: [facility];  Service: Cardiovascular;   Laterality: N/A;   TRABECULECTOMY Right 05/17/2014   Procedure: TRABECULECTOMY WITH MITOMYCIN RIGHT EYE;  Surgeon: Marylynn Pearson, MD;  Location: [facility];  Service: Ophthalmology;  Laterality: Right;   TUBAL LIGATION     US ECHOCARDIOGRAPHY  02/2008, 03/2006, 06/2004       Inpatient Medications: Scheduled Meds:  amiodarone  200 mg Oral Daily   furosemide  40 mg Oral Daily   hydrALAZINE  12.5 mg Oral TID   isosorbide mononitrate  15 mg Oral Daily   metoprolol succinate  100 mg Oral Daily   pantoprazole  40 mg Oral Daily   tamsulosin  0.4 mg Oral Daily   warfarin  7 mg Oral ONCE-1600   Warfarin - Pharmacist Dosing Inpatient   Does not apply q1600   Continuous Infusions:  PRN Meds: acetaminophen **OR** acetaminophen, metoprolol tartrate, perflutren lipid microspheres (DEFINITY) IV suspension, polyethylene glycol  Allergies:    Allergies  Allergen Reactions   Iodinated Contrast Media Rash    Pt stated in the past had broken out in a rash on back and itching.    Esomeprazole Magnesium     REACTION: stomach upset, nausea    Social History:   Social History   Socioeconomic History   Marital status: Divorced    Spouse name: Not on file   Number of children: 5   Years of education: 12   Highest education level: High school graduate  Occupational History   Occupation: Retired  Tobacco Use   Smoking status: Former    Packs/day: 1.50    Years: 15.00    Additional pack years: 0.00    Total pack years: 22.50    Types: Cigarettes    Quit date: 1987    Years since quitting: 37.2   Smokeless tobacco: Never   Tobacco comments:    quit 1987  Vaping Use   Vaping Use: Never used  Substance and Sexual Activity   Alcohol use: No   Drug use: No   Sexual activity: Not Currently  Other Topics Concern   Not on file  Social History Narrative   Patient lives alone in [place].   Patient has 3 children who live near her and offer support. 2 live up [place].   Patient is active in  [place] and enjoys walking for exercise.    Social Determinants of Health   Financial Resource Strain: Low Risk  (10/07/2018)   Overall Financial Resource Strain (CARDIA)    Difficulty of Paying Living Expenses: Not hard at all  Food Insecurity: No Food Insecurity (10/07/2018)   Hunger Vital Sign    Worried About Running Out of Food in the Last Year: Never true    Ran Out of Food in the Last Year: Never true  Transportation Needs: No Transportation Needs (10/07/2018)   PRAPARE - Hydrologist (Medical): No    Lack of Transportation (Non-Medical): No  Physical Activity: Insufficiently Active (10/07/2018)   Exercise Vital Sign  Days of Exercise per Week: 3 days    Minutes of Exercise per Session: 30 min  Stress: No Stress Concern Present (10/07/2018)   [place]    Feeling of Stress : Not at all  Social Connections: Moderately Isolated (10/07/2018)   Social Connection and Isolation Panel [NHANES]    Frequency of Communication with Friends and Family: Three times a week    Frequency of Social Gatherings with Friends and Family: Three times a week    Attends Religious Services: 1 to 4 times per year    Active Member of Clubs or Organizations: No    Attends Archivist Meetings: Never    Marital Status: Divorced  Human resources officer Violence: Not At Risk (10/07/2018)   Humiliation, Afraid, Rape, and Kick questionnaire    Fear of Current or Ex-Partner: No    Emotionally Abused: No    Physically Abused: No    Sexually Abused: No    Family History:   Family History  Problem Relation Age of Onset   Cancer Father        prostate cancer   Hypertension Mother    Stroke Mother        CVA x 2   Cancer Sister        in the Bladder   Endometriosis Daughter    Stroke Sister    Blindness Sister    Hypertension Sister    Thyroid disease Daughter    Hypertension Daughter    Hypertension  Daughter    Hypertension Daughter    Liver disease Daughter    Colon cancer Neg Hx    Breast cancer Neg Hx      ROS:  Please see the history of present illness.  No fever, chills, cough, vomiting, melena, hematochezia, hematuria. She has chronic diarrhea.  All other ROS reviewed and negative.     Physical Exam/Data:   Vitals:   04/05/22 0334 04/05/22 0500 04/05/22 0753 04/05/22 0918  BP: 111/72  115/78 127/74  Pulse: 61  67 82  Resp: (!) 21  19   Temp: (!) 97.3 F (36.3 C)  97.9 F (36.6 C)   TempSrc: Oral  Oral   SpO2: 97%  98%   Weight:  64.6 kg    Height:        Intake/Output Summary (Last 24 hours) at 04/05/2022 1013 Last data filed at 04/05/2022 [id number] Gross per 24 hour  Intake 240 ml  Output --  Net 240 ml      04/05/2022    5:00 AM 04/04/2022    6:10 PM 04/04/2022   10:51 AM  Last 3 Weights  Weight (lbs) 142 lb 6.7 oz 149 lb 0.5 oz 154 lb  Weight (kg) 64.6 kg 67.6 kg 69.854 kg     Body mass index is 21.65 kg/m.  General:  Well nourished, well developed, in no acute distress HEENT: normal Neck: no JVD or HJR Vascular: No carotid bruits; DP 2+ bilaterally Cardiac:  mechanical S1, normal S2, 3/6 crescendo-decrescendo systolic murmur at the RUSB Lungs:  clear to auscultation bilaterally, no wheezing, rhonchi or rales  Abd: soft, nontender   Ext: no edema Musculoskeletal:  No deformities  Skin: warm and dry  Neuro:  CNs 2-12 intact, no focal abnormalities noted Psych:  Normal affect   EKG:  The EKG was personally reviewed and demonstrates:  see HPI Telemetry:  Telemetry was personally reviewed and demonstrates:  atrial fibrillation   Laboratory Data:  High Sensitivity Troponin:   Recent Labs  Lab 04/04/22 1100 04/04/22 1302 04/04/22 2348 04/05/22 0120  TROPONINIHS 20* 26* 29* 30*     Chemistry Recent Labs  Lab 04/04/22 1100 04/04/22 2348  NA 137 139  K 3.5 3.6  CL 104 102  CO2 20* 21*  GLUCOSE 101* 92  BUN 54* 43*  CREATININE 2.86* 2.30*   CALCIUM 9.4 9.8  GFRNONAA 16* 20*  ANIONGAP 13 16*    Hematology Recent Labs  Lab 04/04/22 1100 04/04/22 2348  WBC 5.8 6.6  RBC 4.30 4.51  HGB 12.0 12.6  HCT 39.1 39.6  MCV 90.9 87.8  MCH 27.9 27.9  MCHC 30.7 31.8  RDW 15.7* 15.8*  PLT 188 173   BNP Recent Labs  Lab 04/04/22 1100  BNP 360.6*     Radiology/Studies:  CT CHEST WO CONTRAST  Result Date: 04/05/2022 CLINICAL DATA:  Aortic aneurysm suspected. EXAM: CT CHEST WITHOUT CONTRAST TECHNIQUE: Multidetector CT imaging of the chest was performed following the standard protocol without IV contrast. RADIATION DOSE REDUCTION: This exam was performed according to the departmental dose-optimization program which includes automated exposure control, adjustment of the mA and/or kV according to patient size and/or use of iterative reconstruction technique. COMPARISON:  None Available. FINDINGS: Cardiovascular: The heart is enlarged and there is no pericardial effusion. Scattered coronary artery calcifications are noted. There is atherosclerotic calcification of the aorta without evidence of aneurysm. The pulmonary trunk is distended measuring 4.2 cm, suggesting underlying pulmonary artery hypertension. Mediastinum/Nodes: No mediastinal or axillary lymphadenopathy. Evaluation of the hilar regions is limited due to lack of IV contrast. The thyroid gland, trachea, and esophagus are within normal limits. There is a small hiatal hernia. Lungs/Pleura: Mild atelectasis is present bilaterally. No effusion or pneumothorax. No significant pulmonary nodule or mass. Upper Abdomen: The liver has a nodular contour, compatible with underlying cirrhosis. The gallbladder is surgically absent. There is a nonobstructive left renal calculus. No acute abnormality. Musculoskeletal: Degenerative changes in the thoracic spine. No acute or suspicious osseous abnormality. IMPRESSION: 1. Aortic atherosclerosis without evidence of aneurysm. 2. Markedly distended  pulmonary trunk suggesting underlying pulmonary artery hypertension. 3. Cardiomegaly with coronary artery calcifications. 4. Morphologic changes of cirrhosis. 5. Nonobstructive left renal calculus. Electronically Signed   By: Brett Fairy M.D.   On: 04/05/2022 01:54   DG Chest Port 1 View  Result Date: 04/04/2022 CLINICAL DATA:  Chest pain EXAM: PORTABLE CHEST 1 VIEW COMPARISON:  03/21/2021 FINDINGS: Stable cardiac enlargement previous median sternotomy. Aortic atherosclerosis. There is no pleural effusion or interstitial edema. No airspace consolidation identified. IMPRESSION: No acute cardiopulmonary abnormalities. Electronically Signed   By: Kerby Moors M.D.   On: 04/04/2022 12:01     Assessment and Plan:   1. Chest pain She mainly notes pleuritic chest pain that started in her R anterior chest with radiation to her back and now notes R scapular pain. Her troponins are minimally elevated and flat. This is not consistent with ACS. Her symptoms sound non-cardiac for the most part. She had normal coronary arteries on cath in 2023. At this point, she does not likely need an ischemic workup.  Her CT and CXR did not suggest infection and her temp and WBC are normal. Her INR is therapeutic, therefore pulmonary embolism seems unlikely. She has had a cholecystectomy, therefore doubt her symptoms are hepatic in nature. She does not really have signs or symptoms of volume overload. Echocardiogram is currently pending. Further cardiac recommendations to follow based upon her  echocardiogram results.   2. S/p mechanical MVR INR goal 2.5-3.5. Managed by pharmacy while here. Echocardiogram pending.   3. Permanent atrial fibrillation HR controlled. She has a wide QRS. QTc is actually stable and it should be ok for her to continue Amiodarone.  4. Chronic kidney disease  She is followed by nephrology as an OP.   5. (HFrEF) heart failure with reduced ejection fraction  As noted, she does not seem to be  volume overloaded. Continue current dose of Lasix at 40 mg once daily, hydralazine 12.5 mg three times a day, Imdur 15 mg once daily, Toprol XL 100 mg once daily. GFR 20. She is not a candidate for ACE/ARB/ARNI, SGLT2 inhib or MRA.   6. Aortic stenosis She had mild to mod AS in 2023. Repeat echocardiogram pending.    Risk Assessment/Risk Scores:       New York Heart Association (NYHA) Functional Class NYHA Class III  CHA2DS2-VASc Score = 6   This indicates a 9.7% annual risk of stroke. The patient's score is based upon: CHF History: 1 HTN History: 1 Diabetes History: 0 Stroke History: 0 Vascular Disease History: 1 Age Score: 2 Gender Score: 1         For questions or updates, please contact [place] Please consult [email or web address] for contact info under    Signed, Richardson Dopp, PA-C  04/05/2022 10:13 AM    I have seen and examined this patient with the APP.  I have reviewed agree with the attached note unless detailed differently below.    The patient is a 87 y.o. female with a hx of (HFrEF) heart failure with reduced ejection fraction, Non-ischemic cardiomyopathy, heart failure with preserved ejection fraction, permanent atrial fibrillation, valvular heart disease s/p St Jude mechanical MVR in 1987 on chronic Coumadin Rx, Mild to mod aortic stenosis, NSVT, Right Bundle Branch Block, hypertension, chronic kidney disease who is being seen 04/05/2022 for the evaluation of chest pain at the request of Dr. Jerilee Hoh.   GEN: Well nourished, well developed, in no acute distress  Cardiac: iRRR; no murmurs, rubs, or gallops, no edema. Mechanical click  Respiratory:  normal work of breathing MSK: no reproducible chest wall tenderness  TTE result today:  1. Left ventricular ejection fraction, by estimation, is 55%. The left  ventricle has normal function. The left ventricle has no regional wall  motion abnormalities. There is moderate left ventricular hypertrophy. Left   ventricular diastolic function could not be evaluated.   2. Right ventricular systolic function is moderately reduced. The right  ventricular size is moderately enlarged. There is mildly elevated  pulmonary artery systolic pressure. The estimated right ventricular  systolic pressure is [id number] mmHg.   3. Left atrial size was massively dilated.   4. Right atrial size was severely dilated.   5. The mitral valve has been repaired/replaced. Trivial mitral valve  regurgitation. The mean mitral valve gradient is 2.0 mmHg with average  heart rate of 65 bpm. There is a unknown size St. Jude mechanical valve  present in the mitral position. Procedure   Date: 60. Echo findings are consistent with normal structure and  function of the mitral valve prosthesis.   6. The aortic valve is abnormal. There is mild calcification of the  aortic valve. Aortic valve regurgitation is mild. Mild to moderate aortic  valve stenosis. Aortic valve mean gradient measures 14.4 mmHg. Aortic  valve Vmax measures 2.40 m/s. DVI 0.30.   7. The inferior vena cava is dilated in size  with >50% respiratory  variability, suggesting right atrial pressure of 8 mmHg.   Chest pain: pleuritic, non-cardiac. Possible pleuritis or MSK pain though I could not reproduce her pain on exam History of mitral valve replacement: normal function. Continue anticoagulation with warfarin Atrial fibrillation: permanent. Rate is controlled. I would favor discontinuing amiodarone and using a safer agent (increase metoprolol or add diltiazem if needed) for rate control.  We will sign off. Please contact by Epic secure chat with questions or concerns.  Doralee Albino MD 04/05/2022 11:46 AM

## 2022-04-05 NOTE — Progress Notes (Addendum)
     Daily Progress Note Intern Pager: [phone]  [name] Medical record number: [id number] Date of birth: Jul 05, 1935 Age: 87 y.o. Gender: female  Primary Care Provider: Dickie La, MD Consultants: None Code Status: Full code  Pt Overview and Major Events to Date:  3/15: Admitted to FMTS  Assessment and Plan: [name] is a 87 y.o. female presenting with exertional chest pain . PMHx includes afib, dilated cardiomyopathy, GERD, HTN, anemia, osteoporosis, RBBB, rheumatic mitral valve disease.  * Chest pain Reports intermittent R chest pain/pressure that radiates to R arm this AM. Occurs at rest and with exertion. H/o dilated cardiomyopathy, prosthetic mitral valve, RBBB and chronic afib. Troponins negative. -Cardiology consulted, appreciate recs -Echo pending  -PT/OT following  Abnormal EKG Repeat EKG this AM showed QT prolongation (correct likely not prolonged per Cards), afib, RBBB. Case discuss with Cards fellow overnight who did not recommend intervention. -EKG in AM -Restart home amiodarone given corrected QT appropriate per Cards  Hypertension Elevated overnight but now normotensive this AM. -Home med hydral 12.5 mg TID, Imdur 15 mg daily   Chronic and Stable:  A-fib: Warfarin per pharmacy GERD: Continue Protonix   FEN/GI: Heart healthy diet  VTE Prophylaxis: Warfarin  Dispo: Home pending symptom improvement  Subjective:  Patient assessed at bedside, sitting up eating breakfast. Reports intermittent R chest pain she describes as "pressure". Radiates to R arm and occasional associated with a HA. Denies N/V. Denies palpitations.  Objective: Temp:  [97.3 F (36.3 C)-98 F (36.7 C)] 97.9 F (36.6 C) (03/16 0753) Pulse Rate:  [61-84] 82 (03/16 0918) Resp:  [13-21] 19 (03/16 0753) BP: (111-193)/(72-111) 127/74 (03/16 0918) SpO2:  [97 %-100 %] 98 % (03/16 0753) Weight:  [64.6 kg-69.9 kg] 64.6 kg (03/16 0500) Physical Exam: General:  Elderly female sitting up in bed, alert Cardiovascular: Irregularly irregular rhythm. Loud systolic murmur Respiratory: CTAB. Normal WOB on RA Abdomen: Soft, non-tender, non-distended Extremities: No peripheral edema  Laboratory: Most recent CBC Lab Results  Component Value Date   WBC 6.6 04/04/2022   HGB 12.6 04/04/2022   HCT 39.6 04/04/2022   MCV 87.8 04/04/2022   PLT 173 04/04/2022   Most recent BMP    Latest Ref Rng & Units 04/04/2022   11:48 PM  BMP  Glucose 70 - 99 mg/dL 92   BUN 8 - 23 mg/dL 43   Creatinine 0.44 - 1.00 mg/dL 2.30   Sodium 135 - 145 mmol/L 139   Potassium 3.5 - 5.1 mmol/L 3.6   Chloride 98 - 111 mmol/L 102   CO2 22 - 32 mmol/L 21   Calcium 8.9 - 10.3 mg/dL 9.8     Other pertinent labs: Troponin: 30>29  Imaging/Diagnostic Tests: CT CHEST WO CONTRAST Result Date: 04/05/2022 IMPRESSION: 1. Aortic atherosclerosis without evidence of aneurysm. 2. Markedly distended pulmonary trunk suggesting underlying pulmonary artery hypertension. 3. Cardiomegaly with coronary artery calcifications. 4. Morphologic changes of cirrhosis. 5. Nonobstructive left renal calculus.  DG Chest Port 1 View Result Date: 04/04/2022 IMPRESSION: No acute cardiopulmonary abnormalities.  Tami Maryland, MD 04/05/2022, 10:40 AM  PGY-1, [place] Intern pager: [phone], text pages welcome Secure chat group [place]

## 2022-04-07 ENCOUNTER — Encounter (HOSPITAL_COMMUNITY): Payer: Self-pay

## 2022-04-07 ENCOUNTER — Inpatient Hospital Stay (HOSPITAL_COMMUNITY)
Admission: EM | Admit: 2022-04-07 | Discharge: 2022-04-11 | DRG: 522 | Disposition: A | Discharge status: Skilled Nursing Facility | Payer: 59 | Attending: Family Medicine | Admitting: Family Medicine

## 2022-04-07 ENCOUNTER — Other Ambulatory Visit: Payer: Self-pay

## 2022-04-07 ENCOUNTER — Emergency Department (HOSPITAL_COMMUNITY): Payer: 59

## 2022-04-07 DIAGNOSIS — Z952 Presence of prosthetic heart valve: Secondary | ICD-10-CM

## 2022-04-07 DIAGNOSIS — Z8249 Family history of ischemic heart disease and other diseases of the circulatory system: Secondary | ICD-10-CM

## 2022-04-07 DIAGNOSIS — Z602 Problems related to living alone: Secondary | ICD-10-CM | POA: Diagnosis present

## 2022-04-07 DIAGNOSIS — N320 Bladder-neck obstruction: Secondary | ICD-10-CM | POA: Diagnosis not present

## 2022-04-07 DIAGNOSIS — N179 Acute kidney failure, unspecified: Secondary | ICD-10-CM | POA: Diagnosis not present

## 2022-04-07 DIAGNOSIS — I482 Chronic atrial fibrillation, unspecified: Secondary | ICD-10-CM | POA: Diagnosis present

## 2022-04-07 DIAGNOSIS — Z91041 Radiographic dye allergy status: Secondary | ICD-10-CM

## 2022-04-07 DIAGNOSIS — S8991XA Unspecified injury of right lower leg, initial encounter: Secondary | ICD-10-CM | POA: Diagnosis not present

## 2022-04-07 DIAGNOSIS — N184 Chronic kidney disease, stage 4 (severe): Secondary | ICD-10-CM | POA: Diagnosis present

## 2022-04-07 DIAGNOSIS — Z743 Need for continuous supervision: Secondary | ICD-10-CM | POA: Diagnosis not present

## 2022-04-07 DIAGNOSIS — S42031A Displaced fracture of lateral end of right clavicle, initial encounter for closed fracture: Secondary | ICD-10-CM

## 2022-04-07 DIAGNOSIS — I059 Rheumatic mitral valve disease, unspecified: Secondary | ICD-10-CM | POA: Diagnosis present

## 2022-04-07 DIAGNOSIS — Z79899 Other long term (current) drug therapy: Secondary | ICD-10-CM

## 2022-04-07 DIAGNOSIS — S72001A Fracture of unspecified part of neck of right femur, initial encounter for closed fracture: Secondary | ICD-10-CM | POA: Diagnosis not present

## 2022-04-07 DIAGNOSIS — W19XXXA Unspecified fall, initial encounter: Secondary | ICD-10-CM

## 2022-04-07 DIAGNOSIS — I420 Dilated cardiomyopathy: Secondary | ICD-10-CM | POA: Diagnosis present

## 2022-04-07 DIAGNOSIS — I5042 Chronic combined systolic (congestive) and diastolic (congestive) heart failure: Secondary | ICD-10-CM | POA: Diagnosis present

## 2022-04-07 DIAGNOSIS — K219 Gastro-esophageal reflux disease without esophagitis: Secondary | ICD-10-CM | POA: Diagnosis present

## 2022-04-07 DIAGNOSIS — Z821 Family history of blindness and visual loss: Secondary | ICD-10-CM

## 2022-04-07 DIAGNOSIS — M25519 Pain in unspecified shoulder: Secondary | ICD-10-CM | POA: Diagnosis not present

## 2022-04-07 DIAGNOSIS — M79604 Pain in right leg: Secondary | ICD-10-CM | POA: Diagnosis not present

## 2022-04-07 DIAGNOSIS — Z7901 Long term (current) use of anticoagulants: Secondary | ICD-10-CM

## 2022-04-07 DIAGNOSIS — S42034A Nondisplaced fracture of lateral end of right clavicle, initial encounter for closed fracture: Secondary | ICD-10-CM | POA: Diagnosis present

## 2022-04-07 DIAGNOSIS — Y92002 Bathroom of unspecified non-institutional (private) residence single-family (private) house as the place of occurrence of the external cause: Secondary | ICD-10-CM

## 2022-04-07 DIAGNOSIS — Z823 Family history of stroke: Secondary | ICD-10-CM

## 2022-04-07 DIAGNOSIS — S72011A Unspecified intracapsular fracture of right femur, initial encounter for closed fracture: Principal | ICD-10-CM

## 2022-04-07 DIAGNOSIS — N189 Chronic kidney disease, unspecified: Secondary | ICD-10-CM | POA: Diagnosis present

## 2022-04-07 DIAGNOSIS — J45909 Unspecified asthma, uncomplicated: Secondary | ICD-10-CM | POA: Diagnosis present

## 2022-04-07 DIAGNOSIS — Z87891 Personal history of nicotine dependence: Secondary | ICD-10-CM

## 2022-04-07 DIAGNOSIS — R6889 Other general symptoms and signs: Secondary | ICD-10-CM | POA: Diagnosis not present

## 2022-04-07 DIAGNOSIS — I10 Essential (primary) hypertension: Secondary | ICD-10-CM | POA: Diagnosis not present

## 2022-04-07 DIAGNOSIS — S7291XA Unspecified fracture of right femur, initial encounter for closed fracture: Secondary | ICD-10-CM | POA: Diagnosis present

## 2022-04-07 DIAGNOSIS — Z888 Allergy status to other drugs, medicaments and biological substances status: Secondary | ICD-10-CM

## 2022-04-07 DIAGNOSIS — E860 Dehydration: Secondary | ICD-10-CM | POA: Diagnosis present

## 2022-04-07 DIAGNOSIS — S7290XA Unspecified fracture of unspecified femur, initial encounter for closed fracture: Secondary | ICD-10-CM

## 2022-04-07 DIAGNOSIS — Z9049 Acquired absence of other specified parts of digestive tract: Secondary | ICD-10-CM

## 2022-04-07 DIAGNOSIS — M810 Age-related osteoporosis without current pathological fracture: Secondary | ICD-10-CM | POA: Diagnosis present

## 2022-04-07 DIAGNOSIS — W1811XA Fall from or off toilet without subsequent striking against object, initial encounter: Secondary | ICD-10-CM | POA: Diagnosis present

## 2022-04-07 DIAGNOSIS — S199XXA Unspecified injury of neck, initial encounter: Secondary | ICD-10-CM | POA: Diagnosis not present

## 2022-04-07 DIAGNOSIS — I130 Hypertensive heart and chronic kidney disease with heart failure and stage 1 through stage 4 chronic kidney disease, or unspecified chronic kidney disease: Secondary | ICD-10-CM | POA: Diagnosis present

## 2022-04-07 MED ORDER — FENTANYL CITRATE PF 50 MCG/ML IJ SOSY
50.0000 ug | PREFILLED_SYRINGE | Freq: Once | INTRAMUSCULAR | Status: AC
  Administered 2022-04-07: 50 ug via INTRAVENOUS
  Filled 2022-04-07: qty 1

## 2022-04-07 NOTE — ED Notes (Signed)
Pt returned from CT °

## 2022-04-07 NOTE — ED Provider Notes (Signed)
[facility] Emergency Department Provider Note MRN:  [id number]  Arrival date & time: 04/08/22     Chief Complaint   Fall (Fall from sitting position)   History of Present Illness   [name] is a 87 y.o. year-old female presents to the ED with chief complaint of fall from standing.  She states that she was on the toilet, stood up, then her legs gave out and she fell to the floor.  She is anticoagulated on warfarin.  She complains of right shoulder pain and right hip pain. She also states that her neck hurts.  She is uncertain if she hit her head.  She doesn't think she passed out.  She denies chest pain or SOB.  She reports severe pain in the hip. Moderate pain in the right shoulder.  Denies treatments prior to arrival. She denies numbness in her extremities at present.  History provided by patient.   Review of Systems  Pertinent positive and negative review of systems noted in HPI.    Physical Exam   Vitals:   04/07/22 2345 04/08/22 0000  BP: 126/77 132/81  Pulse: 90 84  Resp: 18 (!) 23  Temp:    SpO2: 100% 97%    CONSTITUTIONAL:  uncomfortable-appearing, NAD NEURO:  Alert and oriented x 3, CN 3-12 grossly intact EYES:  eyes equal and reactive ENT/NECK:  Supple, no stridor  CARDIO:  normal rate, regular rhythm, appears well-perfused, distal pulses intact.  PULM:  No respiratory distress, CTAB GI/GU:  non-distended,  MSK/SPINE:  RLE shortened and externally rotated, right shoulder TTP SKIN:  no rash, atraumatic   *Additional and/or pertinent findings included in MDM below  Diagnostic and Interventional Summary    EKG Interpretation  Date/Time:  Monday April 07 2022 19:49:24 EDT Ventricular Rate:  84 PR Interval:    QRS Duration: 220 QT Interval:  493 QTC Calculation: 583 R Axis:   168 Text Interpretation: Atrial fibrillation Right bundle branch block NO SIGNIFICANT CHANGE SINCE LAST TRACING YESTERDAY Confirmed by Fredia Sorrow  [phone]) on 04/07/2022 7:53:33 PM       Labs Reviewed  CBC WITH DIFFERENTIAL/PLATELET - Abnormal; Notable for the following components:      Result Value   RDW 15.8 (*)    All other components within normal limits  PROTIME-INR - Abnormal; Notable for the following components:   Prothrombin Time 37.5 (*)    INR 3.8 (*)    All other components within normal limits  COMPREHENSIVE METABOLIC PANEL - Abnormal; Notable for the following components:   Glucose, Bld 147 (*)    BUN 64 (*)    Creatinine, Ser 4.12 (*)    GFR, Estimated 10 (*)    All other components within normal limits  I-STAT CHEM 8, ED - Abnormal; Notable for the following components:   BUN 59 (*)    Creatinine, Ser 4.80 (*)    Glucose, Bld 137 (*)    All other components within normal limits  LACTIC ACID, PLASMA  URINALYSIS, ROUTINE W REFLEX MICROSCOPIC  BASIC METABOLIC PANEL  CBC  SAMPLE TO BLOOD BANK    CT HEAD WO CONTRAST (5MM)  Final Result    CT Cervical Spine Wo Contrast  Final Result    DG Pelvis Portable  Final Result    DG Shoulder Right  Final Result    DG Chest Port 1 View  Final Result    DG Knee 1-2 Views Right    (Results Pending)  Medications  polyethylene glycol (MIRALAX / GLYCOLAX) packet 17 g (has no administration in time range)  senna-docusate (Senokot-S) tablet 1 tablet (has no administration in time range)  tamsulosin (FLOMAX) capsule 0.4 mg (has no administration in time range)  lactated ringers bolus 500 mL (has no administration in time range)  acetaminophen (TYLENOL) tablet 650 mg (has no administration in time range)  oxyCODONE (Oxy IR/ROXICODONE) immediate release tablet 5 mg (has no administration in time range)  HYDROmorphone (DILAUDID) injection 0.5-1 mg (has no administration in time range)  fentaNYL (SUBLIMAZE) injection 50 mcg (50 mcg Intravenous Given 04/07/22 2235)  fentaNYL (SUBLIMAZE) injection 50 mcg (50 mcg Intravenous Given 04/08/22 0040)  lactated ringers bolus  500 mL (500 mLs Intravenous New Bag/Given 04/08/22 0052)     Procedures  /  Critical Care Procedures  ED Course and Medical Decision Making  I have reviewed the triage vital signs, the nursing notes, and pertinent available records from the EMR.  Social Determinants Affecting Complexity of Care: Patient has no clinically significant social determinants affecting this chief complaint..   ED Course: Clinical Course as of 04/08/22 0119  Mon Apr 07, 2022  2211 Patient seen by me when I started my shift at 2200.  She had a fall from standing to the ground.  She is on blood thinner (coumadin).  She hit her right shoulder and right hip.  She's uncertain if she hit her head, but does report neck pain.  She is upgraded to a Level 2 trauma per department protocol.  Fall on thinners. [RB]  2212 ATLS protocol followed.  ABCs intact.  D- Probable deformity to the right hip with shortening and external rotation. [RB]    Clinical Course User Index [RB] Montine Circle, PA-C    Medical Decision Making Patient here after a fall from standing after using the toilet.    Amount and/or Complexity of Data Reviewed Labs: ordered.    Details: Cr 4.12, increased from recent admission.  AKI. Radiology: ordered.    Details: Fx of right femoral neck Right clavicle fx  Risk Prescription drug management. Decision regarding hospitalization.     Consultants: I consulted with Dr. Kathaleen Bury from ortho, who will see patient in the AM.  Requests coumadin reversal and NPO.   Treatment and Plan: Patient's exam and diagnostic results are concerning for hip fracture.  Feel that patient will need admission to the hospital for further treatment and evaluation.  Patient seen by and discussed with attending physician, Dr. Christy Gentles, who agrees with plan.  Final Clinical Impressions(s) / ED Diagnoses     ICD-10-CM   1. Closed subcapital fracture of right femur, initial encounter ([place])  S72.011A     2. Closed  displaced fracture of acromial end of right clavicle, initial encounter  S42.031A     3. AKI (acute kidney injury) ([place])  N17.9       ED Discharge Orders     None         Discharge Instructions Discussed with and Provided to Patient:   Discharge Instructions   None      Montine Circle, PA-C 04/08/22 0121    Ripley Fraise, MD 04/08/22 [phone]

## 2022-04-07 NOTE — Progress Notes (Signed)
Trauma Response Nurse Documentation   [name] is a 87 y.o. female arriving to [facility] ED via EMS  On warfarin daily. Trauma was activated as a Level 2 by ED PA based on the following trauma criteria Elderly patients > 65 with head trauma on anti-coagulation (excluding ASA). Trauma team at the bedside on patient arrival.   Patient cleared for CT by Lorre Munroe EDP. Pt transported to CT with trauma response nurse present to monitor. RN remained with the patient throughout their absence from the department for clinical observation.   GCS 15.  History   Past Medical History:  Diagnosis Date   Asthma    years ago   Chronic atrial fibrillation ([place])    Dilated cardiomyopathy ([place])    history of this, now resolved   Dysphagia    Hx   Fracture of tibial plateau 05/21/2015   GERD (gastroesophageal reflux disease)    GI bleed    Hemorrhoid 04/2014   bleeding   History of blood transfusion    Hypertension    Microcytic anemia    Nonsustained ventricular tachycardia (HCC)    Osteoporosis    Protein in urine    RBBB (right bundle branch block)    Rheumatic mitral valve disease      Past Surgical History:  Procedure Laterality Date   CHOLECYSTECTOMY  2005   COLONOSCOPY     COLONOSCOPY W/ POLYPECTOMY     EYE SURGERY  04/2014   HEMICOLECTOMY  2008   forpolyps   MITOMYCIN C APPLICATION Right [id number]   Procedure: MITOMYCIN C APPLICATION;  Surgeon: Marylynn Pearson, MD;  Location: [place];  Service: Ophthalmology;  Laterality: Right;   MITRAL VALVE REPLACEMENT  1987   St Jude mechanical valve   RIGHT/LEFT HEART CATH AND CORONARY ANGIOGRAPHY N/A 04/02/2021   Procedure: RIGHT/LEFT HEART CATH AND CORONARY ANGIOGRAPHY;  Surgeon: Jolaine Artist, MD;  Location: [facility];  Service: Cardiovascular;  Laterality: N/A;   TRABECULECTOMY Right 05/17/2014   Procedure: TRABECULECTOMY WITH MITOMYCIN RIGHT EYE;  Surgeon: Marylynn Pearson, MD;  Location: [place];  Service: Ophthalmology;   Laterality: Right;   TUBAL LIGATION     US ECHOCARDIOGRAPHY  02/2008, 03/2006, 06/2004       Initial Focused Assessment (If applicable, or please see trauma documentation): Alert/oriented female presents via EMS after a ground level fall on her right side, takes coumadin. Late activation by EDP over two hours after arrival, it appears that it was initially thought that patient did not hit her head. C/o right side shoulder and leg pain. Airway patent/ unobstructed, BS clear No obvious uncontrolled hemorrhage GCS 15, left pupil 59mm, right pupil 54mm sluggish  CT's Completed:   CT Head and CT C-Spine   Interventions:  IV start Portable chest, right shoulder and pelvis XRAY EKG Shoulder immobilizer  Plan for disposition:  Admission to floor   Consults completed:  Orthopaedic Surgeon at 2256.  Event Summary: Presents via EMS after a fall off the commode. Reports right sided shoulder and hip pain, right leg rotation noted by EMS. Assisted with IV access and pain control, escorted to CT. Pending ortho consult, admit.   Bedside handoff with ED RN Nani Gasser.    Xitlali Kastens O Lindie Roberson  Trauma Response RN  Please call TRN at [phone] for further assistance.

## 2022-04-07 NOTE — ED Notes (Signed)
Pt transported to CT w/ Primary RN & TRN

## 2022-04-07 NOTE — ED Triage Notes (Addendum)
Pt BIBEMS w/ c/o Rt arm & Rt leg pain s/p fall PTA. Denies any LOC or trauma to head. As per pt she was on the potty when she tried to stand her legs felt numb & couldn't support her weight. Pt endorsed she is currently on Warfarin. Rt leg rotated, able to move toes on affected leg, distal pulses present.

## 2022-04-08 ENCOUNTER — Observation Stay (HOSPITAL_COMMUNITY): Payer: 59 | Admitting: Certified Registered Nurse Anesthetist

## 2022-04-08 ENCOUNTER — Encounter (HOSPITAL_COMMUNITY): Payer: Self-pay | Admitting: Family Medicine

## 2022-04-08 ENCOUNTER — Observation Stay (HOSPITAL_COMMUNITY): Payer: 59

## 2022-04-08 ENCOUNTER — Encounter (HOSPITAL_COMMUNITY)
Admission: EM | Disposition: A | Discharge status: Skilled Nursing Facility | Payer: Self-pay | Source: Home / Self Care | Attending: Family Medicine

## 2022-04-08 ENCOUNTER — Other Ambulatory Visit: Payer: Self-pay

## 2022-04-08 DIAGNOSIS — S42034A Nondisplaced fracture of lateral end of right clavicle, initial encounter for closed fracture: Secondary | ICD-10-CM | POA: Diagnosis present

## 2022-04-08 DIAGNOSIS — Z8249 Family history of ischemic heart disease and other diseases of the circulatory system: Secondary | ICD-10-CM | POA: Diagnosis not present

## 2022-04-08 DIAGNOSIS — Z888 Allergy status to other drugs, medicaments and biological substances status: Secondary | ICD-10-CM | POA: Diagnosis not present

## 2022-04-08 DIAGNOSIS — S7291XA Unspecified fracture of right femur, initial encounter for closed fracture: Secondary | ICD-10-CM | POA: Diagnosis not present

## 2022-04-08 DIAGNOSIS — S72011A Unspecified intracapsular fracture of right femur, initial encounter for closed fracture: Secondary | ICD-10-CM

## 2022-04-08 DIAGNOSIS — N189 Chronic kidney disease, unspecified: Secondary | ICD-10-CM | POA: Diagnosis not present

## 2022-04-08 DIAGNOSIS — I951 Orthostatic hypotension: Secondary | ICD-10-CM | POA: Diagnosis not present

## 2022-04-08 DIAGNOSIS — K219 Gastro-esophageal reflux disease without esophagitis: Secondary | ICD-10-CM | POA: Diagnosis present

## 2022-04-08 DIAGNOSIS — Z87891 Personal history of nicotine dependence: Secondary | ICD-10-CM

## 2022-04-08 DIAGNOSIS — S72041A Displaced fracture of base of neck of right femur, initial encounter for closed fracture: Secondary | ICD-10-CM | POA: Diagnosis not present

## 2022-04-08 DIAGNOSIS — S42034D Nondisplaced fracture of lateral end of right clavicle, subsequent encounter for fracture with routine healing: Secondary | ICD-10-CM | POA: Diagnosis not present

## 2022-04-08 DIAGNOSIS — I10 Essential (primary) hypertension: Secondary | ICD-10-CM | POA: Diagnosis not present

## 2022-04-08 DIAGNOSIS — Z602 Problems related to living alone: Secondary | ICD-10-CM | POA: Diagnosis present

## 2022-04-08 DIAGNOSIS — I5040 Unspecified combined systolic (congestive) and diastolic (congestive) heart failure: Secondary | ICD-10-CM | POA: Diagnosis not present

## 2022-04-08 DIAGNOSIS — I110 Hypertensive heart disease with heart failure: Secondary | ICD-10-CM

## 2022-04-08 DIAGNOSIS — Z952 Presence of prosthetic heart valve: Secondary | ICD-10-CM | POA: Diagnosis not present

## 2022-04-08 DIAGNOSIS — I420 Dilated cardiomyopathy: Secondary | ICD-10-CM | POA: Diagnosis not present

## 2022-04-08 DIAGNOSIS — I482 Chronic atrial fibrillation, unspecified: Secondary | ICD-10-CM | POA: Diagnosis not present

## 2022-04-08 DIAGNOSIS — M1711 Unilateral primary osteoarthritis, right knee: Secondary | ICD-10-CM | POA: Diagnosis not present

## 2022-04-08 DIAGNOSIS — S42031A Displaced fracture of lateral end of right clavicle, initial encounter for closed fracture: Secondary | ICD-10-CM | POA: Diagnosis not present

## 2022-04-08 DIAGNOSIS — S7290XA Unspecified fracture of unspecified femur, initial encounter for closed fracture: Secondary | ICD-10-CM

## 2022-04-08 DIAGNOSIS — I059 Rheumatic mitral valve disease, unspecified: Secondary | ICD-10-CM | POA: Diagnosis not present

## 2022-04-08 DIAGNOSIS — S72001A Fracture of unspecified part of neck of right femur, initial encounter for closed fracture: Secondary | ICD-10-CM | POA: Diagnosis not present

## 2022-04-08 DIAGNOSIS — W19XXXA Unspecified fall, initial encounter: Secondary | ICD-10-CM

## 2022-04-08 DIAGNOSIS — I5042 Chronic combined systolic (congestive) and diastolic (congestive) heart failure: Secondary | ICD-10-CM | POA: Diagnosis not present

## 2022-04-08 DIAGNOSIS — L89626 Pressure-induced deep tissue damage of left heel: Secondary | ICD-10-CM | POA: Diagnosis not present

## 2022-04-08 DIAGNOSIS — Z96641 Presence of right artificial hip joint: Secondary | ICD-10-CM | POA: Diagnosis not present

## 2022-04-08 DIAGNOSIS — Z8719 Personal history of other diseases of the digestive system: Secondary | ICD-10-CM | POA: Diagnosis not present

## 2022-04-08 DIAGNOSIS — Z9181 History of falling: Secondary | ICD-10-CM | POA: Diagnosis not present

## 2022-04-08 DIAGNOSIS — I509 Heart failure, unspecified: Secondary | ICD-10-CM | POA: Diagnosis not present

## 2022-04-08 DIAGNOSIS — J45909 Unspecified asthma, uncomplicated: Secondary | ICD-10-CM | POA: Diagnosis not present

## 2022-04-08 DIAGNOSIS — Z471 Aftercare following joint replacement surgery: Secondary | ICD-10-CM | POA: Diagnosis not present

## 2022-04-08 DIAGNOSIS — N183 Chronic kidney disease, stage 3 unspecified: Secondary | ICD-10-CM | POA: Diagnosis not present

## 2022-04-08 DIAGNOSIS — N320 Bladder-neck obstruction: Secondary | ICD-10-CM | POA: Diagnosis not present

## 2022-04-08 DIAGNOSIS — Z821 Family history of blindness and visual loss: Secondary | ICD-10-CM | POA: Diagnosis not present

## 2022-04-08 DIAGNOSIS — N184 Chronic kidney disease, stage 4 (severe): Secondary | ICD-10-CM | POA: Diagnosis not present

## 2022-04-08 DIAGNOSIS — N179 Acute kidney failure, unspecified: Secondary | ICD-10-CM | POA: Diagnosis not present

## 2022-04-08 DIAGNOSIS — I4891 Unspecified atrial fibrillation: Secondary | ICD-10-CM | POA: Diagnosis not present

## 2022-04-08 DIAGNOSIS — W1811XA Fall from or off toilet without subsequent striking against object, initial encounter: Secondary | ICD-10-CM | POA: Diagnosis present

## 2022-04-08 DIAGNOSIS — Z91041 Radiographic dye allergy status: Secondary | ICD-10-CM | POA: Diagnosis not present

## 2022-04-08 DIAGNOSIS — I130 Hypertensive heart and chronic kidney disease with heart failure and stage 1 through stage 4 chronic kidney disease, or unspecified chronic kidney disease: Secondary | ICD-10-CM | POA: Diagnosis not present

## 2022-04-08 DIAGNOSIS — Z7901 Long term (current) use of anticoagulants: Secondary | ICD-10-CM | POA: Diagnosis not present

## 2022-04-08 DIAGNOSIS — Y92002 Bathroom of unspecified non-institutional (private) residence single-family (private) house as the place of occurrence of the external cause: Secondary | ICD-10-CM | POA: Diagnosis not present

## 2022-04-08 DIAGNOSIS — I150 Renovascular hypertension: Secondary | ICD-10-CM | POA: Diagnosis not present

## 2022-04-08 DIAGNOSIS — Z043 Encounter for examination and observation following other accident: Secondary | ICD-10-CM | POA: Diagnosis not present

## 2022-04-08 DIAGNOSIS — M810 Age-related osteoporosis without current pathological fracture: Secondary | ICD-10-CM | POA: Diagnosis not present

## 2022-04-08 DIAGNOSIS — S7291XD Unspecified fracture of right femur, subsequent encounter for closed fracture with routine healing: Secondary | ICD-10-CM | POA: Diagnosis not present

## 2022-04-08 DIAGNOSIS — E860 Dehydration: Secondary | ICD-10-CM | POA: Diagnosis not present

## 2022-04-08 DIAGNOSIS — Z79899 Other long term (current) drug therapy: Secondary | ICD-10-CM | POA: Diagnosis not present

## 2022-04-08 DIAGNOSIS — Z9049 Acquired absence of other specified parts of digestive tract: Secondary | ICD-10-CM | POA: Diagnosis not present

## 2022-04-08 HISTORY — PX: TOTAL HIP ARTHROPLASTY: SHX124

## 2022-04-08 LAB — CBC
HCT: 35.7 % — ABNORMAL LOW (ref 36.0–46.0)
Hemoglobin: 11.6 g/dL — ABNORMAL LOW (ref 12.0–15.0)
MCH: 28.8 pg (ref 26.0–34.0)
MCHC: 32.5 g/dL (ref 30.0–36.0)
MCV: 88.6 fL (ref 80.0–100.0)
Platelets: 158 10*3/uL (ref 150–400)
RBC: 4.03 MIL/uL (ref 3.87–5.11)
RDW: 15.9 % — ABNORMAL HIGH (ref 11.5–15.5)
WBC: 12.8 10*3/uL — ABNORMAL HIGH (ref 4.0–10.5)
nRBC: 0 % (ref 0.0–0.2)

## 2022-04-08 LAB — COMPREHENSIVE METABOLIC PANEL
ALT: 37 U/L (ref 0–44)
AST: 36 U/L (ref 15–41)
Albumin: 3.7 g/dL (ref 3.5–5.0)
Alkaline Phosphatase: 78 U/L (ref 38–126)
Anion gap: 8 (ref 5–15)
BUN: 64 mg/dL — ABNORMAL HIGH (ref 8–23)
CO2: 23 mmol/L (ref 22–32)
Calcium: 9.2 mg/dL (ref 8.9–10.3)
Chloride: 105 mmol/L (ref 98–111)
Creatinine, Ser: 4.12 mg/dL — ABNORMAL HIGH (ref 0.44–1.00)
GFR, Estimated: 10 mL/min — ABNORMAL LOW (ref >60)
Glucose, Bld: 147 mg/dL — ABNORMAL HIGH (ref 70–99)
Potassium: 3.9 mmol/L (ref 3.5–5.1)
Sodium: 136 mmol/L (ref 135–145)
Total Bilirubin: 0.8 mg/dL (ref 0.3–1.2)
Total Protein: 7.6 g/dL (ref 6.5–8.1)

## 2022-04-08 LAB — BASIC METABOLIC PANEL
Anion gap: 11 (ref 5–15)
BUN: 67 mg/dL — ABNORMAL HIGH (ref 8–23)
CO2: 23 mmol/L (ref 22–32)
Calcium: 9.2 mg/dL (ref 8.9–10.3)
Chloride: 102 mmol/L (ref 98–111)
Creatinine, Ser: 3.84 mg/dL — ABNORMAL HIGH (ref 0.44–1.00)
GFR, Estimated: 11 mL/min — ABNORMAL LOW (ref >60)
Glucose, Bld: 146 mg/dL — ABNORMAL HIGH (ref 70–99)
Potassium: 4 mmol/L (ref 3.5–5.1)
Sodium: 136 mmol/L (ref 135–145)

## 2022-04-08 LAB — CBC WITH DIFFERENTIAL/PLATELET
Abs Immature Granulocytes: 0.06 10*3/uL (ref 0.00–0.07)
Basophils Absolute: 0 10*3/uL (ref 0.0–0.1)
Basophils Relative: 0 %
Eosinophils Absolute: 0 10*3/uL (ref 0.0–0.5)
Eosinophils Relative: 0 %
HCT: 39.2 % (ref 36.0–46.0)
Hemoglobin: 12.3 g/dL (ref 12.0–15.0)
Immature Granulocytes: 1 %
Lymphocytes Relative: 14 %
Lymphs Abs: 1.4 10*3/uL (ref 0.7–4.0)
MCH: 28.4 pg (ref 26.0–34.0)
MCHC: 31.4 g/dL (ref 30.0–36.0)
MCV: 90.5 fL (ref 80.0–100.0)
Monocytes Absolute: 0.6 10*3/uL (ref 0.1–1.0)
Monocytes Relative: 6 %
Neutro Abs: 7.7 10*3/uL (ref 1.7–7.7)
Neutrophils Relative %: 79 %
Platelets: 165 10*3/uL (ref 150–400)
RBC: 4.33 MIL/uL (ref 3.87–5.11)
RDW: 15.8 % — ABNORMAL HIGH (ref 11.5–15.5)
WBC: 9.8 10*3/uL (ref 4.0–10.5)
nRBC: 0 % (ref 0.0–0.2)

## 2022-04-08 LAB — I-STAT CHEM 8, ED
BUN: 59 mg/dL — ABNORMAL HIGH (ref 8–23)
Calcium, Ion: 1.24 mmol/L (ref 1.15–1.40)
Chloride: 104 mmol/L (ref 98–111)
Creatinine, Ser: 4.8 mg/dL — ABNORMAL HIGH (ref 0.44–1.00)
Glucose, Bld: 137 mg/dL — ABNORMAL HIGH (ref 70–99)
HCT: 40 % (ref 36.0–46.0)
Hemoglobin: 13.6 g/dL (ref 12.0–15.0)
Potassium: 4 mmol/L (ref 3.5–5.1)
Sodium: 139 mmol/L (ref 135–145)
TCO2: 22 mmol/L (ref 22–32)

## 2022-04-08 LAB — LACTIC ACID, PLASMA: Lactic Acid, Venous: 1.4 mmol/L (ref 0.5–1.9)

## 2022-04-08 LAB — PROTIME-INR
INR: 1.6 — ABNORMAL HIGH (ref 0.8–1.2)
INR: 3.8 — ABNORMAL HIGH (ref 0.8–1.2)
Prothrombin Time: 19.2 seconds — ABNORMAL HIGH (ref 11.4–15.2)
Prothrombin Time: 37.5 seconds — ABNORMAL HIGH (ref 11.4–15.2)

## 2022-04-08 LAB — SAMPLE TO BLOOD BANK

## 2022-04-08 SURGERY — ARTHROPLASTY, HIP, TOTAL,POSTERIOR APPROACH
Anesthesia: General | Site: Hip | Laterality: Right

## 2022-04-08 MED ORDER — 0.9 % SODIUM CHLORIDE (POUR BTL) OPTIME
TOPICAL | Status: DC | PRN
  Administered 2022-04-08: 1000 mL

## 2022-04-08 MED ORDER — HEPARIN (PORCINE) 25000 UT/250ML-% IV SOLN
900.0000 [IU]/h | INTRAVENOUS | Status: DC
  Administered 2022-04-08: 900 [IU]/h via INTRAVENOUS
  Filled 2022-04-08: qty 250

## 2022-04-08 MED ORDER — ROCURONIUM BROMIDE 10 MG/ML (PF) SYRINGE
PREFILLED_SYRINGE | INTRAVENOUS | Status: AC
  Filled 2022-04-08: qty 10

## 2022-04-08 MED ORDER — LACTATED RINGERS IV SOLN: INTRAVENOUS | Status: AC

## 2022-04-08 MED ORDER — TRANEXAMIC ACID-NACL 1000-0.7 MG/100ML-% IV SOLN
1000.0000 mg | INTRAVENOUS | Status: AC
  Administered 2022-04-08: 1000 mg via INTRAVENOUS
  Filled 2022-04-08: qty 100

## 2022-04-08 MED ORDER — TAMSULOSIN HCL 0.4 MG PO CAPS
0.4000 mg | ORAL_CAPSULE | Freq: Every day | ORAL | Status: DC
  Administered 2022-04-08 – 2022-04-11 (×4): 0.4 mg via ORAL
  Filled 2022-04-08 (×4): qty 1

## 2022-04-08 MED ORDER — POLYETHYLENE GLYCOL 3350 17 G PO PACK: 17.0000 g | PACK | Freq: Every day | ORAL | Status: DC | PRN

## 2022-04-08 MED ORDER — METOCLOPRAMIDE HCL 5 MG PO TABS: 5.0000 mg | ORAL_TABLET | Freq: Three times a day (TID) | ORAL | Status: DC | PRN

## 2022-04-08 MED ORDER — FENTANYL CITRATE (PF) 250 MCG/5ML IJ SOLN
INTRAMUSCULAR | Status: AC
  Filled 2022-04-08: qty 5

## 2022-04-08 MED ORDER — METOPROLOL SUCCINATE ER 50 MG PO TB24
100.0000 mg | ORAL_TABLET | Freq: Every day | ORAL | Status: DC
  Administered 2022-04-08 – 2022-04-11 (×4): 100 mg via ORAL
  Filled 2022-04-08 (×4): qty 2

## 2022-04-08 MED ORDER — ORAL CARE MOUTH RINSE: 15.0000 mL | Freq: Once | OROMUCOSAL | Status: DC

## 2022-04-08 MED ORDER — OXYCODONE HCL 5 MG PO TABS
5.0000 mg | ORAL_TABLET | ORAL | Status: DC | PRN
  Administered 2022-04-08: 5 mg via ORAL
  Filled 2022-04-08: qty 1

## 2022-04-08 MED ORDER — ORAL CARE MOUTH RINSE: 15.0000 mL | OROMUCOSAL | Status: DC | PRN

## 2022-04-08 MED ORDER — ROCURONIUM BROMIDE 10 MG/ML (PF) SYRINGE
PREFILLED_SYRINGE | INTRAVENOUS | Status: DC | PRN
  Administered 2022-04-08: 50 mg via INTRAVENOUS

## 2022-04-08 MED ORDER — MENTHOL 3 MG MT LOZG: 1.0000 | LOZENGE | OROMUCOSAL | Status: DC | PRN

## 2022-04-08 MED ORDER — FENTANYL CITRATE PF 50 MCG/ML IJ SOSY
50.0000 ug | PREFILLED_SYRINGE | Freq: Once | INTRAMUSCULAR | Status: AC
  Administered 2022-04-08: 50 ug via INTRAVENOUS
  Filled 2022-04-08: qty 1

## 2022-04-08 MED ORDER — METHOCARBAMOL 500 MG PO TABS
500.0000 mg | ORAL_TABLET | Freq: Four times a day (QID) | ORAL | Status: DC | PRN
  Administered 2022-04-10 – 2022-04-11 (×3): 500 mg via ORAL
  Filled 2022-04-08 (×3): qty 1

## 2022-04-08 MED ORDER — ALUM & MAG HYDROXIDE-SIMETH 200-200-20 MG/5ML PO SUSP: 30.0000 mL | ORAL | Status: DC | PRN

## 2022-04-08 MED ORDER — DEXAMETHASONE SODIUM PHOSPHATE 10 MG/ML IJ SOLN
INTRAMUSCULAR | Status: DC | PRN
  Administered 2022-04-08: 5 mg via INTRAVENOUS

## 2022-04-08 MED ORDER — HYDROCODONE-ACETAMINOPHEN 5-325 MG PO TABS: 1.0000 | ORAL_TABLET | ORAL | Status: DC | PRN

## 2022-04-08 MED ORDER — BISACODYL 10 MG RE SUPP: 10.0000 mg | Freq: Every day | RECTAL | Status: DC | PRN

## 2022-04-08 MED ORDER — HEPARIN (PORCINE) 25000 UT/250ML-% IV SOLN
900.0000 [IU]/h | INTRAVENOUS | Status: DC
  Administered 2022-04-09: 900 [IU]/h via INTRAVENOUS
  Filled 2022-04-08 (×2): qty 250

## 2022-04-08 MED ORDER — METOPROLOL SUCCINATE ER 50 MG PO TB24
50.0000 mg | ORAL_TABLET | Freq: Every day | ORAL | Status: DC
  Administered 2022-04-08 – 2022-04-10 (×2): 50 mg via ORAL
  Filled 2022-04-08 (×3): qty 1

## 2022-04-08 MED ORDER — DIPHENHYDRAMINE HCL 12.5 MG/5ML PO ELIX: 12.5000 mg | ORAL_SOLUTION | ORAL | Status: DC | PRN

## 2022-04-08 MED ORDER — WARFARIN SODIUM 7.5 MG PO TABS
7.5000 mg | ORAL_TABLET | Freq: Once | ORAL | Status: AC
  Administered 2022-04-09: 7.5 mg via ORAL
  Filled 2022-04-08: qty 1

## 2022-04-08 MED ORDER — METHOCARBAMOL 1000 MG/10ML IJ SOLN: 500.0000 mg | Freq: Four times a day (QID) | INTRAVENOUS | Status: DC | PRN

## 2022-04-08 MED ORDER — CEFAZOLIN SODIUM-DEXTROSE 1-4 GM/50ML-% IV SOLN
1.0000 g | Freq: Four times a day (QID) | INTRAVENOUS | Status: AC
  Administered 2022-04-08 – 2022-04-09 (×2): 1 g via INTRAVENOUS
  Filled 2022-04-08 (×2): qty 50

## 2022-04-08 MED ORDER — LACTATED RINGERS IV BOLUS
500.0000 mL | Freq: Once | INTRAVENOUS | Status: AC
  Administered 2022-04-08: 500 mL via INTRAVENOUS

## 2022-04-08 MED ORDER — MAGNESIUM CITRATE PO SOLN: 1.0000 | Freq: Once | ORAL | Status: DC | PRN

## 2022-04-08 MED ORDER — CHLORHEXIDINE GLUCONATE 4 % EX LIQD: 60.0000 mL | Freq: Once | CUTANEOUS | Status: DC

## 2022-04-08 MED ORDER — PROPOFOL 10 MG/ML IV BOLUS
INTRAVENOUS | Status: AC
  Filled 2022-04-08: qty 20

## 2022-04-08 MED ORDER — BUPIVACAINE LIPOSOME 1.3 % IJ SUSP
INTRAMUSCULAR | Status: AC
  Filled 2022-04-08: qty 20

## 2022-04-08 MED ORDER — SODIUM CHLORIDE 0.9 % IV SOLN: INTRAVENOUS | Status: DC

## 2022-04-08 MED ORDER — MUSCLE RUB 10-15 % EX CREA
TOPICAL_CREAM | CUTANEOUS | Status: DC | PRN
  Administered 2022-04-08: 1 via TOPICAL
  Filled 2022-04-08: qty 85

## 2022-04-08 MED ORDER — ACETAMINOPHEN 500 MG PO TABS
1000.0000 mg | ORAL_TABLET | Freq: Four times a day (QID) | ORAL | Status: AC
  Administered 2022-04-08 – 2022-04-09 (×4): 1000 mg via ORAL
  Filled 2022-04-08 (×4): qty 2

## 2022-04-08 MED ORDER — SODIUM CHLORIDE 0.9 % IV SOLN: INTRAVENOUS | Status: DC | PRN

## 2022-04-08 MED ORDER — LIDOCAINE 2% (20 MG/ML) 5 ML SYRINGE
INTRAMUSCULAR | Status: DC | PRN
  Administered 2022-04-08: 60 mg via INTRAVENOUS

## 2022-04-08 MED ORDER — CHLORHEXIDINE GLUCONATE 0.12 % MT SOLN
OROMUCOSAL | Status: AC
  Administered 2022-04-08: 15 mL
  Filled 2022-04-08: qty 15

## 2022-04-08 MED ORDER — DEXAMETHASONE SODIUM PHOSPHATE 10 MG/ML IJ SOLN
10.0000 mg | Freq: Once | INTRAMUSCULAR | Status: AC
  Administered 2022-04-09: 10 mg via INTRAVENOUS
  Filled 2022-04-08: qty 1

## 2022-04-08 MED ORDER — PHENYLEPHRINE HCL-NACL 20-0.9 MG/250ML-% IV SOLN
INTRAVENOUS | Status: DC | PRN
  Administered 2022-04-08: 30 ug/min via INTRAVENOUS
  Administered 2022-04-08: 100 ug/min via INTRAVENOUS

## 2022-04-08 MED ORDER — ONDANSETRON HCL 4 MG PO TABS: 4.0000 mg | ORAL_TABLET | Freq: Four times a day (QID) | ORAL | Status: DC | PRN

## 2022-04-08 MED ORDER — ONDANSETRON HCL 4 MG/2ML IJ SOLN
INTRAMUSCULAR | Status: AC
  Filled 2022-04-08: qty 2

## 2022-04-08 MED ORDER — CEFAZOLIN SODIUM-DEXTROSE 2-4 GM/100ML-% IV SOLN
2.0000 g | INTRAVENOUS | Status: AC
  Administered 2022-04-08: 2 g via INTRAVENOUS
  Filled 2022-04-08: qty 100

## 2022-04-08 MED ORDER — DOCUSATE SODIUM 100 MG PO CAPS
100.0000 mg | ORAL_CAPSULE | Freq: Two times a day (BID) | ORAL | Status: DC
  Administered 2022-04-08 – 2022-04-10 (×4): 100 mg via ORAL
  Filled 2022-04-08 (×4): qty 1

## 2022-04-08 MED ORDER — METOCLOPRAMIDE HCL 5 MG/ML IJ SOLN: 5.0000 mg | Freq: Three times a day (TID) | INTRAMUSCULAR | Status: DC | PRN

## 2022-04-08 MED ORDER — CHLORHEXIDINE GLUCONATE 0.12 % MT SOLN: 15.0000 mL | Freq: Once | OROMUCOSAL | Status: DC

## 2022-04-08 MED ORDER — TRANEXAMIC ACID-NACL 1000-0.7 MG/100ML-% IV SOLN
1000.0000 mg | Freq: Once | INTRAVENOUS | Status: AC
  Administered 2022-04-08: 1000 mg via INTRAVENOUS
  Filled 2022-04-08: qty 100

## 2022-04-08 MED ORDER — PANTOPRAZOLE SODIUM 40 MG PO TBEC
40.0000 mg | DELAYED_RELEASE_TABLET | Freq: Every day | ORAL | Status: DC
  Administered 2022-04-08 – 2022-04-11 (×4): 40 mg via ORAL
  Filled 2022-04-08 (×4): qty 1

## 2022-04-08 MED ORDER — HYDROMORPHONE HCL 1 MG/ML IJ SOLN: 0.5000 mg | INTRAMUSCULAR | Status: DC | PRN

## 2022-04-08 MED ORDER — VITAMIN K1 10 MG/ML IJ SOLN
2.5000 mg | Freq: Once | INTRAVENOUS | Status: AC
  Administered 2022-04-08: 2.5 mg via INTRAVENOUS
  Filled 2022-04-08: qty 0.25

## 2022-04-08 MED ORDER — PROTHROMBIN COMPLEX CONC HUMAN 500 UNITS IV KIT
1569.0000 [IU] | PACK | Status: AC
  Administered 2022-04-08: 1569 [IU] via INTRAVENOUS
  Filled 2022-04-08: qty 569

## 2022-04-08 MED ORDER — ONDANSETRON HCL 4 MG/2ML IJ SOLN: 4.0000 mg | Freq: Four times a day (QID) | INTRAMUSCULAR | Status: DC | PRN

## 2022-04-08 MED ORDER — DEXAMETHASONE SODIUM PHOSPHATE 10 MG/ML IJ SOLN
INTRAMUSCULAR | Status: AC
  Filled 2022-04-08: qty 1

## 2022-04-08 MED ORDER — PROPOFOL 10 MG/ML IV BOLUS
INTRAVENOUS | Status: DC | PRN
  Administered 2022-04-08 (×2): 50 mg via INTRAVENOUS

## 2022-04-08 MED ORDER — OXYCODONE HCL 5 MG PO TABS
5.0000 mg | ORAL_TABLET | ORAL | Status: DC | PRN
  Administered 2022-04-10 – 2022-04-11 (×3): 5 mg via ORAL
  Filled 2022-04-08 (×3): qty 1

## 2022-04-08 MED ORDER — ONDANSETRON HCL 4 MG/2ML IJ SOLN
INTRAMUSCULAR | Status: DC | PRN
  Administered 2022-04-08: 4 mg via INTRAVENOUS

## 2022-04-08 MED ORDER — FENTANYL CITRATE (PF) 250 MCG/5ML IJ SOLN
INTRAMUSCULAR | Status: DC | PRN
  Administered 2022-04-08: 50 ug via INTRAVENOUS

## 2022-04-08 MED ORDER — BUPIVACAINE LIPOSOME 1.3 % IJ SUSP
INTRAMUSCULAR | Status: DC | PRN
  Administered 2022-04-08: 20 mL

## 2022-04-08 MED ORDER — SUGAMMADEX SODIUM 200 MG/2ML IV SOLN
INTRAVENOUS | Status: DC | PRN
  Administered 2022-04-08: 128.8 mg via INTRAVENOUS

## 2022-04-08 MED ORDER — HYDROMORPHONE HCL 1 MG/ML IJ SOLN
0.5000 mg | INTRAMUSCULAR | Status: DC | PRN
  Administered 2022-04-08: 0.5 mg via INTRAVENOUS
  Filled 2022-04-08: qty 1

## 2022-04-08 MED ORDER — SENNOSIDES-DOCUSATE SODIUM 8.6-50 MG PO TABS: 1.0000 | ORAL_TABLET | Freq: Once | ORAL | Status: DC

## 2022-04-08 MED ORDER — VASOPRESSIN 20 UNIT/ML IV SOLN
INTRAVENOUS | Status: DC | PRN
  Administered 2022-04-08: 2 [IU] via INTRAVENOUS
  Administered 2022-04-08: 1 [IU] via INTRAVENOUS
  Administered 2022-04-08: 2 [IU] via INTRAVENOUS
  Administered 2022-04-08: 1 [IU] via INTRAVENOUS

## 2022-04-08 MED ORDER — PHENYLEPHRINE HCL (PRESSORS) 10 MG/ML IV SOLN
INTRAVENOUS | Status: DC | PRN
  Administered 2022-04-08: 80 ug via INTRAVENOUS
  Administered 2022-04-08 (×2): 160 ug via INTRAVENOUS
  Administered 2022-04-08: 240 ug via INTRAVENOUS

## 2022-04-08 MED ORDER — PHENYLEPHRINE 80 MCG/ML (10ML) SYRINGE FOR IV PUSH (FOR BLOOD PRESSURE SUPPORT)
PREFILLED_SYRINGE | INTRAVENOUS | Status: AC
  Filled 2022-04-08: qty 10

## 2022-04-08 MED ORDER — ACETAMINOPHEN 325 MG PO TABS
650.0000 mg | ORAL_TABLET | Freq: Four times a day (QID) | ORAL | Status: DC
  Administered 2022-04-08: 650 mg via ORAL
  Filled 2022-04-08: qty 2

## 2022-04-08 MED ORDER — SODIUM CHLORIDE FLUSH 0.9 % IV SOLN
INTRAVENOUS | Status: DC | PRN
  Administered 2022-04-08: 30 mL

## 2022-04-08 MED ORDER — WARFARIN - PHARMACIST DOSING INPATIENT: Freq: Every day | Status: DC

## 2022-04-08 MED ORDER — TRAMADOL HCL 50 MG PO TABS
50.0000 mg | ORAL_TABLET | Freq: Four times a day (QID) | ORAL | Status: DC
  Administered 2022-04-08 – 2022-04-09 (×3): 50 mg via ORAL
  Filled 2022-04-08 (×3): qty 1

## 2022-04-08 MED ORDER — PHENOL 1.4 % MT LIQD: 1.0000 | OROMUCOSAL | Status: DC | PRN

## 2022-04-08 MED ORDER — POVIDONE-IODINE 10 % EX SWAB
2.0000 | Freq: Once | CUTANEOUS | Status: AC
  Administered 2022-04-08: 2 via TOPICAL

## 2022-04-08 MED ORDER — LACTATED RINGERS IV BOLUS: 500.0000 mL | Freq: Once | INTRAVENOUS | Status: DC

## 2022-04-08 MED ORDER — VASOPRESSIN 20 UNIT/ML IV SOLN
INTRAVENOUS | Status: AC
  Filled 2022-04-08: qty 1

## 2022-04-08 SURGICAL SUPPLY — 60 items
BAG COUNTER SPONGE SURGICOUNT (BAG) ×1 IMPLANT
BAG SPNG CNTER NS LX DISP (BAG) ×1
BLADE SAG 18X100X1.27 (BLADE) IMPLANT
CLSR STERI-STRIP ANTIMIC 1/2X4 (GAUZE/BANDAGES/DRESSINGS) ×1 IMPLANT
COVER PERINEAL POST (MISCELLANEOUS) ×1 IMPLANT
COVER SURGICAL LIGHT HANDLE (MISCELLANEOUS) ×1 IMPLANT
DRAPE C-ARM 42X72 X-RAY (DRAPES) ×1 IMPLANT
DRAPE STERI IOBAN 125X83 (DRAPES) ×1 IMPLANT
DRAPE U-SHAPE 47X51 STRL (DRAPES) IMPLANT
DRSG MEPILEX POST OP 4X8 (GAUZE/BANDAGES/DRESSINGS) ×1 IMPLANT
DURAPREP 26ML APPLICATOR (WOUND CARE) ×1 IMPLANT
ELECT BLADE 4.0 EZ CLEAN MEGAD (MISCELLANEOUS) ×1
ELECT REM PT RETURN 9FT ADLT (ELECTROSURGICAL) ×1
ELECTRODE BLDE 4.0 EZ CLN MEGD (MISCELLANEOUS) ×1 IMPLANT
ELECTRODE REM PT RTRN 9FT ADLT (ELECTROSURGICAL) ×1 IMPLANT
FACESHIELD WRAPAROUND (MASK) ×2 IMPLANT
FACESHIELD WRAPAROUND OR TEAM (MASK) ×1 IMPLANT
GLOVE BIO SURGEON STRL SZ7.5 (GLOVE) ×1 IMPLANT
GLOVE BIOGEL PI IND STRL 7.5 (GLOVE) ×1 IMPLANT
GLOVE BIOGEL PI IND STRL 8 (GLOVE) ×1 IMPLANT
GLOVE SURG SYN 7.5  E (GLOVE) ×1
GLOVE SURG SYN 7.5 E (GLOVE) ×1 IMPLANT
GLOVE SURG SYN 7.5 PF PI (GLOVE) ×1 IMPLANT
GOWN STRL REUS W/ TWL LRG LVL3 (GOWN DISPOSABLE) ×2 IMPLANT
GOWN STRL REUS W/ TWL XL LVL3 (GOWN DISPOSABLE) ×1 IMPLANT
GOWN STRL REUS W/TWL LRG LVL3 (GOWN DISPOSABLE) ×2
GOWN STRL REUS W/TWL XL LVL3 (GOWN DISPOSABLE) ×1
HEAD BIOLOX HIP 36/-5 (Joint) IMPLANT
HIP BIOLOX HD 36/-5 (Joint) ×1 IMPLANT
INSERT TRIDENT POLY 36 0DEG (Insert) IMPLANT
KIT BASIN OR (CUSTOM PROCEDURE TRAY) ×1 IMPLANT
KIT TURNOVER KIT B (KITS) ×1 IMPLANT
MANIFOLD NEPTUNE II (INSTRUMENTS) ×1 IMPLANT
NDL 18GX1X1/2 (RX/OR ONLY) (NEEDLE) IMPLANT
NEEDLE 18GX1X1/2 (RX/OR ONLY) (NEEDLE) IMPLANT
NEEDLE HYPO 22GX1.5 SAFETY (NEEDLE) ×1 IMPLANT
NS IRRIG 1000ML POUR BTL (IV SOLUTION) ×1 IMPLANT
PACK TOTAL JOINT (CUSTOM PROCEDURE TRAY) ×1 IMPLANT
PAD ARMBOARD 7.5X6 YLW CONV (MISCELLANEOUS) ×1 IMPLANT
SCREW HEX LP 6.5X20 (Screw) IMPLANT
SHELL CLUSTERHOLE ACETABULAR 5 (Shell) IMPLANT
SPONGE T-LAP 18X18 ~~LOC~~+RFID (SPONGE) IMPLANT
STEM 37MM HIP (Hips) IMPLANT
SUT MNCRL AB 4-0 PS2 18 (SUTURE) ×1 IMPLANT
SUT STRATAFIX 1PDS 45CM VIOLET (SUTURE) ×1 IMPLANT
SUT VIC AB 0 CT1 27 (SUTURE) ×2
SUT VIC AB 0 CT1 27XBRD ANBCTR (SUTURE) ×2 IMPLANT
SUT VIC AB 1 CT1 27 (SUTURE) ×2
SUT VIC AB 1 CT1 27XBRD ANBCTR (SUTURE) ×2 IMPLANT
SUT VIC AB 2-0 CT1 27 (SUTURE) ×2
SUT VIC AB 2-0 CT1 TAPERPNT 27 (SUTURE) ×2 IMPLANT
SYR 20ML LL LF (SYRINGE) IMPLANT
SYR 50ML LL SCALE MARK (SYRINGE) ×2 IMPLANT
SYR BULB IRRIG 60ML STRL (SYRINGE) ×1 IMPLANT
TOWEL GREEN STERILE (TOWEL DISPOSABLE) ×1 IMPLANT
TOWEL GREEN STERILE FF (TOWEL DISPOSABLE) ×1 IMPLANT
TRAY CATH INTERMITTENT SS 16FR (CATHETERS) IMPLANT
TRAY FOLEY MTR SLVR 16FR STAT (SET/KITS/TRAYS/PACK) IMPLANT
WATER STERILE IRR 1000ML POUR (IV SOLUTION) ×1 IMPLANT
YANKAUER SUCT BULB TIP NO VENT (SUCTIONS) ×2 IMPLANT

## 2022-04-08 NOTE — Progress Notes (Signed)
OT Cancellation Note  Patient Details Name: [name] MRN: [id number] DOB: 30-Nov-1935   Cancelled Treatment:    Reason Eval/Treat Not Completed: Other (comment) (Pt scheduled for surgery today. Will follow up post surgery as appropriate.)  [name],[name] 04/08/2022, 11:08 AM [name], OT/L   Acute OT Clinical Specialist Acute Rehabilitation Services Pager [phone] Office [phone]

## 2022-04-08 NOTE — Interval H&P Note (Signed)
History and Physical Interval Note:  04/08/2022 11:28 AM  [name]  has presented today for surgery, with the diagnosis of HIP FRACTURE.  The various methods of treatment have been discussed with the patient and family. After consideration of risks, benefits and other options for treatment, the patient has consented to  Procedure(s): TOTAL HIP ARTHROPLASTY (Right) as a surgical intervention.  The patient's history has been reviewed, patient examined, no change in status, stable for surgery.  I have reviewed the patient's chart and labs.  Questions were answered to the patient's satisfaction.     Renette Butters

## 2022-04-08 NOTE — Assessment & Plan Note (Addendum)
XR showed subcapital right femoral neck fracture. Orthopedics to evaluate this AM for potential surgical intervention. -Orthopedic surgery consulted, appreciate care  -PT/OT following -Fall precautions -Heparin pre-op per Pharmacy - Pain control:   - Tylenol 1000 mg Q6H scheduled   - Oxycodone 5 mg Q4H PRN  - Dilaudid 0.5-1 mg Q4H for breakthrough pain

## 2022-04-08 NOTE — Progress Notes (Signed)
ANTICOAGULATION CONSULT NOTE - Initial Consult  Pharmacy Consult for warfarin/heparin Indication: atrial fibrillation and mechanical MVR  Allergies  Allergen Reactions   Iodinated Contrast Media Rash    Pt stated in the past had broken out in a rash on back and itching.    Esomeprazole Magnesium     REACTION: stomach upset, nausea    Patient Measurements: Height: 5\' 8"  (172.7 cm) Weight: 64.4 kg (142 lb) IBW/kg (Calculated) : 63.9 Heparin Dosing Weight: 64.6 kg  Vital Signs: Temp: 97.5 F (36.4 C) (03/19 1600) Temp Source: Oral (03/19 1132) BP: 108/72 (03/19 1600) Pulse Rate: 70 (03/19 1600)  Labs: Recent Labs    04/07/22 2356 04/08/22 0018 04/08/22 0421  HGB 12.3 13.6 11.6*  HCT 39.2 40.0 35.7*  PLT 165  --  158  LABPROT 37.5*  --  19.2*  INR 3.8*  --  1.6*  CREATININE 4.12* 4.80* 3.84*     Estimated Creatinine Clearance: 11 mL/min (A) (by C-G formula based on SCr of 3.84 mg/dL (H)).   Medical History: Past Medical History:  Diagnosis Date   Asthma    years ago   Chronic atrial fibrillation ([place])    Dilated cardiomyopathy ([place])    history of this, now resolved   Dysphagia    Hx   Fracture of tibial plateau 05/21/2015   GERD (gastroesophageal reflux disease)    GI bleed    Hemorrhoid 04/2014   bleeding   History of blood transfusion    Hypertension    Microcytic anemia    Nonsustained ventricular tachycardia (HCC)    Osteoporosis    Protein in urine    RBBB (right bundle branch block)    Rheumatic mitral valve disease     Medications:  Medications Prior to Admission  Medication Sig Dispense Refill Last Dose   Cholecalciferol (VITAMIN D) 2000 units tablet Take 1 tablet (2,000 Units total) by mouth daily. 100 tablet 3 UNKNOWN   dorzolamidel-timolol (COSOPT) 22.3-6.8 MG/ML SOLN ophthalmic solution Place 1 drop into both eyes in the morning and at bedtime.  4 04/07/2022 at am   furosemide (LASIX) 40 MG tablet Take 1 tablet (40 mg total) by mouth  daily. 90 tablet 3 04/07/2022 at am   hydrALAZINE (APRESOLINE) 25 MG tablet TAKE 1/2 TABLET BY MOUTH THREE TIMES DAILY WITH FOOD (Patient taking differently: Take 12.5 mg by mouth with breakfast, with lunch, and with evening meal.) 137 tablet 3 04/06/2022   isosorbide mononitrate (IMDUR) 30 MG 24 hr tablet Take one half tablet by mouth daily (Patient taking differently: Take 15 mg by mouth daily.) 45 tablet 3 04/06/2022   LUMIGAN 0.01 % SOLN Place 1 drop into both eyes at bedtime.  0 04/06/2022 at pm   metoprolol succinate (TOPROL-XL) 100 MG 24 hr tablet Take 0.5-1 tablets (50-100 mg total) by mouth 2 (two) times daily. Please take 1 tablet (100mg ) in the morning and 0.5 tablet (50mg ) in the evening. 90 tablet 1 04/06/2022 at 1800   pantoprazole (PROTONIX) 40 MG tablet TAKE 1 TABLET(40 MG) BY MOUTH DAILY (Patient taking differently: Take 40 mg by mouth daily.) 90 tablet 3 04/06/2022   polyethylene glycol (MIRALAX / GLYCOLAX) 17 g packet Take 17 g by mouth daily as needed for mild constipation.   Past Week   tamsulosin (FLOMAX) 0.4 MG CAPS capsule TAKE 1 CAPSULE(0.4 MG) BY MOUTH DAILY (Patient taking differently: Take 0.4 mg by mouth daily.) 90 capsule 3 04/07/2022 at am   warfarin (COUMADIN) 10 MG tablet TAKE  1/2 TO 1 TABLET BY MOUTH EVERY DAY AS DIRECTED BY COAGULATION CLINIC (Patient taking differently: Take 5 mg by mouth daily.) 55 tablet 1 04/06/2022 at 1600    Assessment: [name] is a 87 yo female presenting s/p fall resulting in right femur and right clavicular fracture. PMH includes AFib on chronic warfarin (mechanical MVR) with INR goal 2.5-3.5. Warfarin held and reversed for likely ortho procedure. Vitamin K 2.5mg  IV x1 and Kcentra 1500 units IV x1. INR down to 1.6 this morning. Pharmacy consulted to start heparin for surgery plans. Hgb stable at 11.6, plt wnl.  Pt is s/p THA. Spoke with Noemi Chapel about when to resume heparin and coumadin for MVR. Ok to resume heparin 3/20 AM and  coumadin 3/20 PM. INR will likely be resistant due to vit k reversal.   Goal of Therapy:  Heparin level 0.3-0.7 units/ml Monitor platelets by anticoagulation protocol: Yes   Plan:  Resume heparin 900 units/hr at 0800 8 hr HL then daily Coumadin 7.5mg  PO x1 tomorrow Daily INR  Onnie Boer, PharmD, [place], AAHIVP, CPP Infectious Disease Pharmacist 04/08/2022 5:40 PM

## 2022-04-08 NOTE — H&P (View-Only) (Signed)
Reason for Consult:Right hip/clavicle fxs Referring Physician: Chrisandra Netters Time called: [id number] Time at bedside: [name] is an 87 y.o. female.  HPI: [name] got up off the toilet and misstepped. This caused her to lose her balance and fall. She had immediate right hip and shoulder pain and could not get up. She was brought to the ED where x-rays showed right hip and clavicle fxs and orthopedic surgery was consulted. She lives alone and generally uses a cane or RW to ambulate.  Past Medical History:  Diagnosis Date   Asthma    years ago   Chronic atrial fibrillation ([place])    Dilated cardiomyopathy ([place])    history of this, now resolved   Dysphagia    Hx   Fracture of tibial plateau 05/21/2015   GERD (gastroesophageal reflux disease)    GI bleed    Hemorrhoid 04/2014   bleeding   History of blood transfusion    Hypertension    Microcytic anemia    Nonsustained ventricular tachycardia (HCC)    Osteoporosis    Protein in urine    RBBB (right bundle branch block)    Rheumatic mitral valve disease     Past Surgical History:  Procedure Laterality Date   CHOLECYSTECTOMY  2005   COLONOSCOPY     COLONOSCOPY W/ POLYPECTOMY     EYE SURGERY  04/2014   HEMICOLECTOMY  2008   forpolyps   MITOMYCIN C APPLICATION Right [id number]   Procedure: MITOMYCIN C APPLICATION;  Surgeon: Marylynn Pearson, MD;  Location: [facility];  Service: Ophthalmology;  Laterality: Right;   MITRAL VALVE REPLACEMENT  1987   St Jude mechanical valve   RIGHT/LEFT HEART CATH AND CORONARY ANGIOGRAPHY N/A 04/02/2021   Procedure: RIGHT/LEFT HEART CATH AND CORONARY ANGIOGRAPHY;  Surgeon: Jolaine Artist, MD;  Location: [facility];  Service: Cardiovascular;  Laterality: N/A;   TRABECULECTOMY Right 05/17/2014   Procedure: TRABECULECTOMY WITH MITOMYCIN RIGHT EYE;  Surgeon: Marylynn Pearson, MD;  Location: [facility];  Service: Ophthalmology;  Laterality: Right;   TUBAL LIGATION     US ECHOCARDIOGRAPHY  02/2008,  03/2006, 06/2004    Family History  Problem Relation Age of Onset   Cancer Father        prostate cancer   Hypertension Mother    Stroke Mother        CVA x 2   Cancer Sister        in the Bladder   Endometriosis Daughter    Stroke Sister    Blindness Sister    Hypertension Sister    Thyroid disease Daughter    Hypertension Daughter    Hypertension Daughter    Hypertension Daughter    Liver disease Daughter    Colon cancer Neg Hx    Breast cancer Neg Hx     Social History:  reports that she quit smoking about 37 years ago. Her smoking use included cigarettes. She has a 22.50 pack-year smoking history. She has never used smokeless tobacco. She reports that she does not drink alcohol and does not use drugs.  Allergies:  Allergies  Allergen Reactions   Iodinated Contrast Media Rash    Pt stated in the past had broken out in a rash on back and itching.    Esomeprazole Magnesium     REACTION: stomach upset, nausea    Medications: I have reviewed the patient's current medications.  Results for orders placed or performed during the hospital encounter of 04/07/22 (from  the past 48 hour(s))  CBC with Differential     Status: Abnormal   Collection Time: 04/07/22 11:56 PM  Result Value Ref Range   WBC 9.8 4.0 - 10.5 K/uL   RBC 4.33 3.87 - 5.11 MIL/uL   Hemoglobin 12.3 12.0 - 15.0 g/dL   HCT 39.2 36.0 - 46.0 %   MCV 90.5 80.0 - 100.0 fL   MCH 28.4 26.0 - 34.0 pg   MCHC 31.4 30.0 - 36.0 g/dL   RDW 15.8 (H) 11.5 - 15.5 %   Platelets 165 150 - 400 K/uL   nRBC 0.0 0.0 - 0.2 %   Neutrophils Relative % 79 %   Neutro Abs 7.7 1.7 - 7.7 K/uL   Lymphocytes Relative 14 %   Lymphs Abs 1.4 0.7 - 4.0 K/uL   Monocytes Relative 6 %   Monocytes Absolute 0.6 0.1 - 1.0 K/uL   Eosinophils Relative 0 %   Eosinophils Absolute 0.0 0.0 - 0.5 K/uL   Basophils Relative 0 %   Basophils Absolute 0.0 0.0 - 0.1 K/uL   Immature Granulocytes 1 %   Abs Immature Granulocytes 0.06 0.00 - 0.07 K/uL     Comment: Performed at [facility], [address] [place], [place] [address]  Protime-INR     Status: Abnormal   Collection Time: 04/07/22 11:56 PM  Result Value Ref Range   Prothrombin Time 37.5 (H) 11.4 - 15.2 seconds   INR 3.8 (H) 0.8 - 1.2    Comment: (NOTE) INR goal varies based on device and disease states. Performed at [facility], [place] [address], [place], [place] [address]   Comprehensive metabolic panel     Status: Abnormal   Collection Time: 04/07/22 11:56 PM  Result Value Ref Range   Sodium 136 135 - 145 mmol/L   Potassium 3.9 3.5 - 5.1 mmol/L   Chloride 105 98 - 111 mmol/L   CO2 23 22 - 32 mmol/L   Glucose, Bld 147 (H) 70 - 99 mg/dL    Comment: Glucose reference range applies only to samples taken after fasting for at least 8 hours.   BUN 64 (H) 8 - 23 mg/dL   Creatinine, Ser 4.12 (H) 0.44 - 1.00 mg/dL   Calcium 9.2 8.9 - 10.3 mg/dL   Total Protein 7.6 6.5 - 8.1 g/dL   Albumin 3.7 3.5 - 5.0 g/dL   AST 36 15 - 41 U/L   ALT 37 0 - 44 U/L   Alkaline Phosphatase 78 38 - 126 U/L   Total Bilirubin 0.8 0.3 - 1.2 mg/dL   GFR, Estimated 10 (L) >60 mL/min    Comment: (NOTE) Calculated using the CKD-EPI Creatinine Equation (2021)    Anion gap 8 5 - 15    Comment: Performed at [facility] [address] [place], [place] [address]  Lactic acid, plasma     Status: None   Collection Time: 04/07/22 11:56 PM  Result Value Ref Range   Lactic Acid, Venous 1.4 0.5 - 1.9 mmol/L    Comment: Performed at [facility] [address] [place], [place] [address]  Sample to Blood Bank     Status: None   Collection Time: 04/07/22 11:56 PM  Result Value Ref Range   Blood Bank Specimen SAMPLE AVAILABLE FOR TESTING    Sample Expiration      04/10/2022,2359 Performed at [facility], [place] [address], [place], [place] [address]   I-Stat Chem 8,  ED     Status: Abnormal   Collection Time: 04/08/22 12:18 AM  Result Value Ref Range   Sodium 139 135 - 145  mmol/L   Potassium 4.0 3.5 - 5.1 mmol/L   Chloride 104 98 - 111 mmol/L   BUN 59 (H) 8 - 23 mg/dL   Creatinine, Ser 4.80 (H) 0.44 - 1.00 mg/dL   Glucose, Bld 137 (H) 70 - 99 mg/dL    Comment: Glucose reference range applies only to samples taken after fasting for at least 8 hours.   Calcium, Ion 1.24 1.15 - 1.40 mmol/L   TCO2 22 22 - 32 mmol/L   Hemoglobin 13.6 12.0 - 15.0 g/dL   HCT 40.0 36.0 - [id number] %  Basic metabolic panel     Status: Abnormal   Collection Time: 04/08/22  4:21 AM  Result Value Ref Range   Sodium 136 135 - 145 mmol/L   Potassium 4.0 3.5 - 5.1 mmol/L   Chloride 102 98 - 111 mmol/L   CO2 23 22 - 32 mmol/L   Glucose, Bld 146 (H) 70 - 99 mg/dL    Comment: Glucose reference range applies only to samples taken after fasting for at least 8 hours.   BUN 67 (H) 8 - 23 mg/dL   Creatinine, Ser 3.84 (H) 0.44 - 1.00 mg/dL   Calcium 9.2 8.9 - 10.3 mg/dL   GFR, Estimated 11 (L) >60 mL/min    Comment: (NOTE) Calculated using the CKD-EPI Creatinine Equation (2021)    Anion gap 11 5 - 15    Comment: Performed at [facility] [address], [place], [place] [address]  CBC     Status: Abnormal   Collection Time: 04/08/22  4:21 AM  Result Value Ref Range   WBC 12.8 (H) 4.0 - 10.5 K/uL   RBC 4.03 3.87 - 5.11 MIL/uL   Hemoglobin 11.6 (L) 12.0 - 15.0 g/dL   HCT 35.7 (L) 36.0 - 46.0 %   MCV 88.6 80.0 - 100.0 fL   MCH 28.8 26.0 - 34.0 pg   MCHC 32.5 30.0 - 36.0 g/dL   RDW 15.9 (H) 11.5 - 15.5 %   Platelets 158 150 - 400 K/uL   nRBC 0.0 0.0 - 0.2 %    Comment: Performed at [facility], [place] [address], [place], [place] [address]  Protime-INR     Status: Abnormal   Collection Time: 04/08/22  4:21 AM  Result Value Ref Range   Prothrombin Time 19.2 (H) 11.4 - 15.2 seconds   INR 1.6 (H) 0.8 - 1.2    Comment: (NOTE) INR goal varies based on device and disease states. Performed at [facility], [place] [address] [place], [place] [address]     DG Knee 1-2 Views  Right  Result Date: 04/08/2022 CLINICAL DATA:  Fall EXAM: RIGHT KNEE - 1-2 VIEW COMPARISON:  None Available. FINDINGS: Moderate tricompartment degenerative changes with joint space narrowing and spurring. No acute bony abnormality. Specifically, no fracture, subluxation, or dislocation. No joint effusion. IMPRESSION: Tricompartment degenerative changes.  No acute bony abnormality. Electronically Signed   By: Rolm Baptise M.D.   On: 04/08/2022 01:58   CT Cervical Spine Wo Contrast  Result Date: 04/07/2022 CLINICAL DATA:  Fall prior to arrival. EXAM: CT CERVICAL SPINE WITHOUT CONTRAST TECHNIQUE: Multidetector CT imaging of the cervical spine was performed without intravenous contrast. Multiplanar CT image reconstructions were also generated. RADIATION DOSE REDUCTION: This exam was performed according to the departmental  dose-optimization program which includes automated exposure control, adjustment of the mA and/or kV according to patient size and/or use of iterative reconstruction technique. COMPARISON:  None Available. FINDINGS: Alignment: Normal. Skull base and vertebrae: No acute fracture. Vertebral body heights are maintained. The dens and skull base are intact. Soft tissues and spinal canal: No prevertebral fluid or swelling. No visible canal hematoma. Disc levels: Mild diffuse degenerative disc disease and facet hypertrophy. Upper chest: Distal right clavicle fracture is partially included. Other: Carotid calcifications IMPRESSION: 1. No acute fracture or subluxation of the cervical spine. 2. Multilevel degenerative change. 3. Distal right clavicle fracture is partially included in the field of view. Electronically Signed   By: Keith Rake M.D.   On: 04/07/2022 23:14   CT HEAD WO CONTRAST (5MM)  Result Date: 04/07/2022 CLINICAL DATA:  Fall prior to arrival. EXAM: CT HEAD WITHOUT CONTRAST TECHNIQUE: Contiguous axial images were obtained from the base of the skull through the vertex without  intravenous contrast. RADIATION DOSE REDUCTION: This exam was performed according to the departmental dose-optimization program which includes automated exposure control, adjustment of the mA and/or kV according to patient size and/or use of iterative reconstruction technique. COMPARISON:  Remote head CT 10/26/2010 FINDINGS: Brain: No intracranial hemorrhage, mass effect, or midline shift. Age related atrophy and chronic small vessel ischemic change. No hydrocephalus. The basilar cisterns are patent. No evidence of territorial infarct or acute ischemia. No extra-axial or intracranial fluid collection. Vascular: Atherosclerosis of skullbase vasculature without hyperdense vessel or abnormal calcification. Skull: No fracture or focal lesion. Sinuses/Orbits: Remote nasal bone fracture which was seen on prior exam. No acute findings. Bilateral cataract resection. Other: No confluent scalp hematoma. IMPRESSION: 1. No acute intracranial abnormality. No skull fracture. 2. Age related atrophy and chronic small vessel ischemic change. Electronically Signed   By: Keith Rake M.D.   On: 04/07/2022 23:09   DG Chest Port 1 View  Result Date: 04/07/2022 CLINICAL DATA:  Trauma, fall. EXAM: PORTABLE CHEST 1 VIEW COMPARISON:  Chest radiograph dated April 04, 2022 FINDINGS: The heart is enlarged. Aortic atherosclerotic calcification with tortuous course. Lungs are clear without evidence of focal consolidation or pleural effusion. Redemonstration of the mildly displaced right distal clavicular fracture and advanced osteoarthritis of the acromioclavicular joint. IMPRESSION: 1. Cardiomegaly.  No active cardiopulmonary disease. 2. Redemonstration of the mildly displaced right distal clavicular fracture. Electronically Signed   By: Keane Police D.O.   On: 04/07/2022 22:55   DG Pelvis Portable  Result Date: 04/07/2022 CLINICAL DATA:  Fall. EXAM: PORTABLE PELVIS 1-2 VIEWS COMPARISON:  None Available. FINDINGS: There is  subcapital right femoral neck fracture with superolateral displacement of the femur. No appreciable pelvic fracture. Degenerate disc disease of the lower lumbar spine. Osteopenia. IMPRESSION: Subcapital right femoral neck fracture. Electronically Signed   By: Keane Police D.O.   On: 04/07/2022 22:54   DG Shoulder Right  Result Date: 04/07/2022 CLINICAL DATA:  Trauma, fall. EXAM: RIGHT SHOULDER - 2+ VIEW COMPARISON:  Chest radiograph dated April 04, 2022 FINDINGS: There is a mildly displaced fracture of the distal clavicle. Moderate acromioclavicular and glenohumeral osteoarthritis. Thoracic spondylosis and sternotomy wires were noted. Low lung volumes without evidence of pleural effusion or pneumothorax. IMPRESSION: 1. Mildly displaced fracture of the distal right clavicle. 2. Moderate acromioclavicular and glenohumeral osteoarthritis. Electronically Signed   By: Keane Police D.O.   On: 04/07/2022 22:51    Review of Systems  HENT:  Negative for ear discharge, ear pain, hearing loss and tinnitus.  Eyes:  Negative for photophobia and pain.  Respiratory:  Negative for cough and shortness of breath.   Cardiovascular:  Negative for chest pain.  Gastrointestinal:  Negative for abdominal pain, nausea and vomiting.  Genitourinary:  Negative for dysuria, flank pain, frequency and urgency.  Musculoskeletal:  Positive for arthralgias (Right hip and shoulder) and neck pain. Negative for back pain and myalgias.  Neurological:  Negative for dizziness and headaches.  Hematological:  Does not bruise/bleed easily.  Psychiatric/Behavioral:  The patient is not nervous/anxious.    Blood pressure 109/75, pulse 91, temperature 98.1 F (36.7 C), resp. rate 16, height 5\' 8"  (1.727 m), weight 64.6 kg, SpO2 96 %. Physical Exam Constitutional:      General: She is not in acute distress.    Appearance: She is well-developed. She is not diaphoretic.  HENT:     Head: Normocephalic and atraumatic.  Eyes:     General:  No scleral icterus.       Right eye: No discharge.        Left eye: No discharge.     Conjunctiva/sclera: Conjunctivae normal.  Cardiovascular:     Rate and Rhythm: Normal rate and regular rhythm.  Pulmonary:     Effort: Pulmonary effort is normal. No respiratory distress.  Musculoskeletal:     Cervical back: Normal range of motion.     Comments: Right shoulder, elbow, wrist, digits- no skin wounds, sling in place, no instability, no blocks to motion  Sens  Ax/R/M/U intact  Mot   Ax/ R/ PIN/ M/ AIN/ U intact  Rad 2+  RLE No traumatic wounds, ecchymosis, or rash  Mod TTP hip  No knee or ankle effusion  Knee stable to varus/ valgus and anterior/posterior stress  Sens DPN, SPN, TN intact  Motor EHL, ext, flex, evers 5/5  DP 2+, PT 2+, No significant edema  Skin:    General: Skin is warm and dry.  Neurological:     Mental Status: She is alert.  Psychiatric:        Mood and Affect: Mood normal.        Behavior: Behavior normal.    Assessment/Plan: Right hip fx -- Plan THA with Dr. Percell Miller today. Please keep NPO. Right clavicle fx -- Plan non-operative management with NWB in sling Multiple medical problems including afib on coumadin, HTN, and AKI -- per primary service    Lisette Abu, PA-C Orthopedic Surgery [phone] 04/08/2022, 8:50 AM

## 2022-04-08 NOTE — Progress Notes (Signed)
Orthopedic Tech Progress Note Patient Details:  [name] [birth year] [id number]  Patient ID: [name], female   DOB: 07/11/1935, 87 y.o.   MRN: [id number] I attended trauma page. Karolee Stamps 04/08/2022, 12:41 AM

## 2022-04-08 NOTE — Discharge Instructions (Addendum)
POST-OPERATIVE OPIOID TAPER INSTRUCTIONS: It is important to wean off of your opioid medication as soon as possible. If you do not need pain medication after your surgery it is ok to stop day one. Opioids include: Codeine, Hydrocodone(Norco, Vicodin), Oxycodone(Percocet, oxycontin) and hydromorphone amongst others.  Long term and even short term use of opiods can cause: Increased pain response Dependence Constipation Depression Respiratory depression And more.  Withdrawal symptoms can include Flu like symptoms Nausea, vomiting And more Techniques to manage these symptoms Hydrate well Eat regular healthy meals Stay active Use relaxation techniques(deep breathing, meditating, yoga) Do Not substitute Alcohol to help with tapering If you have been on opioids for less than two weeks and do not have pain than it is ok to stop all together.  Plan to wean off of opioids This plan should start within one week post op of your joint replacement. Maintain the same interval or time between taking each dose and first decrease the dose.  Cut the total daily intake of opioids by one tablet each day Next start to increase the time between doses. The last dose that should be eliminated is the evening dose.    Dear [name],   Thank you for letting us participate in your care! In this section, you will find a brief hospital admission summary of why you were admitted to the hospital, what happened during your admission, your diagnosis/diagnoses, and recommended follow up.  Primary diagnosis: R hip fracture Treatment plan: Your hip was repaired by orthopedics and you underwent rehab services in the hospital. You were transitioned from your Warfarin to Heparin for surgery and then bridged back to your Warfarin. Secondary diagnosis: Kidney injury Treatment plan: We monitored your kidney function as it was elevated from your baseline value. You had a kidney ultrasound that was normal. By the time  you were discharged your kidney function had returned to baseline value.   POST-HOSPITAL & CARE INSTRUCTIONS We recommend following up with your PCP within 1 week from being discharged from the hospital. Please let PCP/Specialists know of any changes in medications that were made which you will be able to see in the medications section of this packet. Please also follow up with orthopedics in 2 weeks. Need to limit ROM of R shoulder due to clavicle fx, should be NWB RUE x 4-6 weeks and in a sling, needs a platform walker rather than a rolling walker to limit weight placed on R shoulder   DOCTOR'S APPOINTMENTS & FOLLOW UP Future Appointments  Date Time Provider [place]  04/16/2022  8:30 AM Dickie La, MD [facility]  04/28/2022  8:45 AM [facility] None  05/26/2022  2:15 PM Evans Lance, MD [facility]  06/25/2022  9:00 AM Marzetta Board, DPM [facility]     Thank you for choosing [facility]! Take care and be well!  [facility]  [place], [place] [address] [phone]

## 2022-04-08 NOTE — Progress Notes (Signed)
ANTICOAGULATION CONSULT NOTE - Initial Consult  Pharmacy Consult for warfarin >> heparin Indication: atrial fibrillation and mechanical MVR  Allergies  Allergen Reactions   Iodinated Contrast Media Rash    Pt stated in the past had broken out in a rash on back and itching.    Esomeprazole Magnesium     REACTION: stomach upset, nausea    Patient Measurements: Height: 5\' 8"  (172.7 cm) Weight: 64.6 kg (142 lb 6.7 oz) IBW/kg (Calculated) : 63.9 Heparin Dosing Weight: 64.6 kg  Vital Signs: Temp: 98.1 F (36.7 C) (03/18 2254) Temp Source: Oral (03/18 1947) BP: 121/77 (03/19 0248) Pulse Rate: 94 (03/19 0248)  Labs: Recent Labs    04/07/22 2356 04/08/22 0018 04/08/22 0421  HGB 12.3 13.6 11.6*  HCT 39.2 40.0 35.7*  PLT 165  --  158  LABPROT 37.5*  --  19.2*  INR 3.8*  --  1.6*  CREATININE 4.12* 4.80* 3.84*    Estimated Creatinine Clearance: 11 mL/min (A) (by C-G formula based on SCr of 3.84 mg/dL (H)).   Medical History: Past Medical History:  Diagnosis Date   Asthma    years ago   Chronic atrial fibrillation ([place])    Dilated cardiomyopathy ([place])    history of this, now resolved   Dysphagia    Hx   Fracture of tibial plateau 05/21/2015   GERD (gastroesophageal reflux disease)    GI bleed    Hemorrhoid 04/2014   bleeding   History of blood transfusion    Hypertension    Microcytic anemia    Nonsustained ventricular tachycardia (HCC)    Osteoporosis    Protein in urine    RBBB (right bundle branch block)    Rheumatic mitral valve disease     Medications:  Medications Prior to Admission  Medication Sig Dispense Refill Last Dose   Cholecalciferol (VITAMIN D) 2000 units tablet Take 1 tablet (2,000 Units total) by mouth daily. 100 tablet 3 UNKNOWN   dorzolamidel-timolol (COSOPT) 22.3-6.8 MG/ML SOLN ophthalmic solution Place 1 drop into both eyes in the morning and at bedtime.  4 04/07/2022 at am   furosemide (LASIX) 40 MG tablet Take 1 tablet (40 mg total) by  mouth daily. 90 tablet 3 04/07/2022 at am   hydrALAZINE (APRESOLINE) 25 MG tablet TAKE 1/2 TABLET BY MOUTH THREE TIMES DAILY WITH FOOD (Patient taking differently: Take 12.5 mg by mouth with breakfast, with lunch, and with evening meal.) 137 tablet 3 04/06/2022   isosorbide mononitrate (IMDUR) 30 MG 24 hr tablet Take one half tablet by mouth daily (Patient taking differently: Take 15 mg by mouth daily.) 45 tablet 3 04/06/2022   LUMIGAN 0.01 % SOLN Place 1 drop into both eyes at bedtime.  0 04/06/2022 at pm   metoprolol succinate (TOPROL-XL) 100 MG 24 hr tablet Take 0.5-1 tablets (50-100 mg total) by mouth 2 (two) times daily. Please take 1 tablet (100mg ) in the morning and 0.5 tablet (50mg ) in the evening. 90 tablet 1 04/06/2022 at 1800   pantoprazole (PROTONIX) 40 MG tablet TAKE 1 TABLET(40 MG) BY MOUTH DAILY (Patient taking differently: Take 40 mg by mouth daily.) 90 tablet 3 04/06/2022   polyethylene glycol (MIRALAX / GLYCOLAX) 17 g packet Take 17 g by mouth daily as needed for mild constipation.   Past Week   tamsulosin (FLOMAX) 0.4 MG CAPS capsule TAKE 1 CAPSULE(0.4 MG) BY MOUTH DAILY (Patient taking differently: Take 0.4 mg by mouth daily.) 90 capsule 3 04/07/2022 at am   warfarin (COUMADIN) 10  MG tablet TAKE 1/2 TO 1 TABLET BY MOUTH EVERY DAY AS DIRECTED BY COAGULATION CLINIC (Patient taking differently: Take 5 mg by mouth daily.) 55 tablet 1 04/06/2022 at 1600    Assessment: [name] is a 87 yo female presenting s/p fall resulting in right femur and right clavicular fracture. PMH includes AFib on chronic warfarin (mechanical MVR) with INR goal 2.5-3.5. Warfarin held and reversed for likely ortho procedure. Vitamin K 2.5mg  IV x1 and Kcentra 1500 units IV x1. INR down to 1.6 this morning. Pharmacy consulted to start heparin for surgery plans. Hgb stable at 11.6, plt wnl.  Plan for Ortho surgery (THA) today.  Goal of Therapy:  Heparin level 0.3-0.7 units/ml Monitor platelets by anticoagulation  protocol: Yes   Plan:  Start heparin infusion at 900 units/hr Check anti-Xa level in 8 hours and daily while on heparin Continue to monitor H&H and platelets F/u post-op heparin and warfarin plans    Thank you for allowing [place] to participate in this patients care. Jens Som, PharmD 04/08/2022 7:17 AM  **Pharmacist phone directory can be found on [email or web address] listed under [place]**

## 2022-04-08 NOTE — Transfer of Care (Signed)
Immediate Anesthesia Transfer of Care Note  Patient: [name]  Procedure(s) Performed: TOTAL HIP ARTHROPLASTY (Right: Hip)  Patient Location: PACU  Anesthesia Type:General  Level of Consciousness: awake, alert , and oriented  Airway & Oxygen Therapy: Patient Spontanous Breathing and Patient connected to nasal cannula oxygen  Post-op Assessment: Report given to RN, Post -op Vital signs reviewed and stable, Patient moving all extremities X 4, and Patient able to stick tongue midline  Post vital signs: Reviewed  Last Vitals:  Vitals Value Taken Time  BP 131/74 04/08/22 1515  Temp 98.6   Pulse 68   Resp 14 04/08/22 1517  SpO2 96   Vitals shown include unvalidated device data.  Last Pain:  Vitals:   04/08/22 1147  TempSrc:   PainSc: 0-No pain         Complications: No notable events documented.

## 2022-04-08 NOTE — ED Notes (Signed)
ED TO INPATIENT HANDOFF REPORT  ED Nurse Name and Phone #: Shenee Wignall [phone]  S Name/Age/Gender [name] 87 y.o. female Room/Bed: 026C/026C  Code Status   Code Status: Full Code  Home/SNF/Other Rehab Patient oriented to: self, place, time & situation  Is this baseline? Yes   Triage Complete: Triage complete  Chief Complaint Femur fracture, right ([place]) [S72.91XA]  Triage Note Pt BIBEMS w/ c/o Rt arm & Rt leg pain s/p fall PTA. Denies any LOC or trauma to head. As per pt she was on the potty when she tried to stand her legs felt numb & couldn't support her weight. Pt endorsed she is currently on Warfarin. Rt leg rotated, able to move toes on affected leg, distal pulses present.    Allergies Allergies  Allergen Reactions   Iodinated Contrast Media Rash    Pt stated in the past had broken out in a rash on back and itching.    Esomeprazole Magnesium     REACTION: stomach upset, nausea    Level of Care/Admitting Diagnosis ED Disposition     ED Disposition  Admit   Condition  --   Comment  Hospital Area: [facility] [[id number]]  Level of Care: Med-Surg [16]  May place patient in observation at [facility] or [place] if equivalent level of care is available:: Yes  Covid Evaluation: Asymptomatic - no recent exposure (last 10 days) testing not required  Diagnosis: Femur fracture, right [id number]  Admitting Physician: Leeanne Rio [phone]  Attending Physician: Leeanne Rio [phone]          B Medical/Surgery History Past Medical History:  Diagnosis Date   Asthma    years ago   Chronic atrial fibrillation ([place])    Dilated cardiomyopathy ([place])    history of this, now resolved   Dysphagia    Hx   Fracture of tibial plateau 05/21/2015   GERD (gastroesophageal reflux disease)    GI bleed    Hemorrhoid 04/2014   bleeding   History of blood transfusion    Hypertension    Microcytic anemia    Nonsustained ventricular  tachycardia ([place])    Osteoporosis    Protein in urine    RBBB (right bundle branch block)    Rheumatic mitral valve disease    Past Surgical History:  Procedure Laterality Date   CHOLECYSTECTOMY  2005   COLONOSCOPY     COLONOSCOPY W/ POLYPECTOMY     EYE SURGERY  04/2014   HEMICOLECTOMY  2008   forpolyps   MITOMYCIN C APPLICATION Right [id number]   Procedure: MITOMYCIN C APPLICATION;  Surgeon: Marylynn Pearson, MD;  Location: [facility];  Service: Ophthalmology;  Laterality: Right;   MITRAL VALVE REPLACEMENT  1987   St Jude mechanical valve   RIGHT/LEFT HEART CATH AND CORONARY ANGIOGRAPHY N/A 04/02/2021   Procedure: RIGHT/LEFT HEART CATH AND CORONARY ANGIOGRAPHY;  Surgeon: Jolaine Artist, MD;  Location: [facility];  Service: Cardiovascular;  Laterality: N/A;   TRABECULECTOMY Right 05/17/2014   Procedure: TRABECULECTOMY WITH MITOMYCIN RIGHT EYE;  Surgeon: Marylynn Pearson, MD;  Location: [facility];  Service: Ophthalmology;  Laterality: Right;   TUBAL LIGATION     US ECHOCARDIOGRAPHY  02/2008, 03/2006, 06/2004     A IV Location/Drains/Wounds Patient Lines/Drains/Airways Status     Active Line/Drains/Airways     Name Placement date Placement time Site Days   Peripheral IV 04/07/22 20 G Posterior;Right Hand 04/07/22  2235  Hand  1  External Urinary Catheter 04/04/22  1810  --  4            Intake/Output Last 24 hours No intake or output data in the 24 hours ending 04/08/22 0137  Labs/Imaging Results for orders placed or performed during the hospital encounter of 04/07/22 (from the past 48 hour(s))  CBC with Differential     Status: Abnormal   Collection Time: 04/07/22 11:56 PM  Result Value Ref Range   WBC 9.8 4.0 - 10.5 K/uL   RBC 4.33 3.87 - 5.11 MIL/uL   Hemoglobin 12.3 12.0 - 15.0 g/dL   HCT 39.2 36.0 - 46.0 %   MCV 90.5 80.0 - 100.0 fL   MCH 28.4 26.0 - 34.0 pg   MCHC 31.4 30.0 - 36.0 g/dL   RDW 15.8 (H) 11.5 - 15.5 %   Platelets 165 150 - 400 K/uL   nRBC 0.0 0.0 -  0.2 %   Neutrophils Relative % 79 %   Neutro Abs 7.7 1.7 - 7.7 K/uL   Lymphocytes Relative 14 %   Lymphs Abs 1.4 0.7 - 4.0 K/uL   Monocytes Relative 6 %   Monocytes Absolute 0.6 0.1 - 1.0 K/uL   Eosinophils Relative 0 %   Eosinophils Absolute 0.0 0.0 - 0.5 K/uL   Basophils Relative 0 %   Basophils Absolute 0.0 0.0 - 0.1 K/uL   Immature Granulocytes 1 %   Abs Immature Granulocytes 0.06 0.00 - 0.07 K/uL    Comment: Performed at [facility], [address] [place], [place] [address]  Protime-INR     Status: Abnormal   Collection Time: 04/07/22 11:56 PM  Result Value Ref Range   Prothrombin Time 37.5 (H) 11.4 - 15.2 seconds   INR 3.8 (H) 0.8 - 1.2    Comment: (NOTE) INR goal varies based on device and disease states. Performed at [facility], [place] [address], [place], [place] [address]   Comprehensive metabolic panel     Status: Abnormal   Collection Time: 04/07/22 11:56 PM  Result Value Ref Range   Sodium 136 135 - 145 mmol/L   Potassium 3.9 3.5 - 5.1 mmol/L   Chloride 105 98 - 111 mmol/L   CO2 23 22 - 32 mmol/L   Glucose, Bld 147 (H) 70 - 99 mg/dL    Comment: Glucose reference range applies only to samples taken after fasting for at least 8 hours.   BUN 64 (H) 8 - 23 mg/dL   Creatinine, Ser 4.12 (H) 0.44 - 1.00 mg/dL   Calcium 9.2 8.9 - 10.3 mg/dL   Total Protein 7.6 6.5 - 8.1 g/dL   Albumin 3.7 3.5 - 5.0 g/dL   AST 36 15 - 41 U/L   ALT 37 0 - 44 U/L   Alkaline Phosphatase 78 38 - 126 U/L   Total Bilirubin 0.8 0.3 - 1.2 mg/dL   GFR, Estimated 10 (L) >60 mL/min    Comment: (NOTE) Calculated using the CKD-EPI Creatinine Equation (2021)    Anion gap 8 5 - 15    Comment: Performed at [facility] [address], [place], [place] [address]  Lactic acid, plasma     Status: None   Collection Time: 04/07/22 11:56 PM  Result Value Ref Range   Lactic Acid, Venous 1.4 0.5 - 1.9 mmol/L    Comment: Performed at [facility] [address],  [place], [place] [address]  Sample to Blood Bank  Status: None   Collection Time: 04/07/22 11:56 PM  Result Value Ref Range   Blood Bank Specimen SAMPLE AVAILABLE FOR TESTING    Sample Expiration      04/10/2022,2359 Performed at [facility], [place] [address] [place], [place] [address]   I-Stat Chem 8, ED     Status: Abnormal   Collection Time: 04/08/22 12:18 AM  Result Value Ref Range   Sodium 139 135 - 145 mmol/L   Potassium 4.0 3.5 - 5.1 mmol/L   Chloride 104 98 - 111 mmol/L   BUN 59 (H) 8 - 23 mg/dL   Creatinine, Ser 4.80 (H) 0.44 - 1.00 mg/dL   Glucose, Bld 137 (H) 70 - 99 mg/dL    Comment: Glucose reference range applies only to samples taken after fasting for at least 8 hours.   Calcium, Ion 1.24 1.15 - 1.40 mmol/L   TCO2 22 22 - 32 mmol/L   Hemoglobin 13.6 12.0 - 15.0 g/dL   HCT 40.0 36.0 - 46.0 %   CT Cervical Spine Wo Contrast  Result Date: 04/07/2022 CLINICAL DATA:  Fall prior to arrival. EXAM: CT CERVICAL SPINE WITHOUT CONTRAST TECHNIQUE: Multidetector CT imaging of the cervical spine was performed without intravenous contrast. Multiplanar CT image reconstructions were also generated. RADIATION DOSE REDUCTION: This exam was performed according to the departmental dose-optimization program which includes automated exposure control, adjustment of the mA and/or kV according to patient size and/or use of iterative reconstruction technique. COMPARISON:  None Available. FINDINGS: Alignment: Normal. Skull base and vertebrae: No acute fracture. Vertebral body heights are maintained. The dens and skull base are intact. Soft tissues and spinal canal: No prevertebral fluid or swelling. No visible canal hematoma. Disc levels: Mild diffuse degenerative disc disease and facet hypertrophy. Upper chest: Distal right clavicle fracture is partially included. Other: Carotid calcifications IMPRESSION: 1. No acute fracture or subluxation of the cervical spine. 2. Multilevel degenerative change.  3. Distal right clavicle fracture is partially included in the field of view. Electronically Signed   By: Keith Rake M.D.   On: 04/07/2022 23:14   CT HEAD WO CONTRAST (5MM)  Result Date: 04/07/2022 CLINICAL DATA:  Fall prior to arrival. EXAM: CT HEAD WITHOUT CONTRAST TECHNIQUE: Contiguous axial images were obtained from the base of the skull through the vertex without intravenous contrast. RADIATION DOSE REDUCTION: This exam was performed according to the departmental dose-optimization program which includes automated exposure control, adjustment of the mA and/or kV according to patient size and/or use of iterative reconstruction technique. COMPARISON:  Remote head CT 10/26/2010 FINDINGS: Brain: No intracranial hemorrhage, mass effect, or midline shift. Age related atrophy and chronic small vessel ischemic change. No hydrocephalus. The basilar cisterns are patent. No evidence of territorial infarct or acute ischemia. No extra-axial or intracranial fluid collection. Vascular: Atherosclerosis of skullbase vasculature without hyperdense vessel or abnormal calcification. Skull: No fracture or focal lesion. Sinuses/Orbits: Remote nasal bone fracture which was seen on prior exam. No acute findings. Bilateral cataract resection. Other: No confluent scalp hematoma. IMPRESSION: 1. No acute intracranial abnormality. No skull fracture. 2. Age related atrophy and chronic small vessel ischemic change. Electronically Signed   By: Keith Rake M.D.   On: 04/07/2022 23:09   DG Chest Port 1 View  Result Date: 04/07/2022 CLINICAL DATA:  Trauma, fall. EXAM: PORTABLE CHEST 1 VIEW COMPARISON:  Chest radiograph dated April 04, 2022 FINDINGS: The heart is enlarged. Aortic atherosclerotic calcification with tortuous course. Lungs are clear without evidence of focal consolidation  or pleural effusion. Redemonstration of the mildly displaced right distal clavicular fracture and advanced osteoarthritis of the  acromioclavicular joint. IMPRESSION: 1. Cardiomegaly.  No active cardiopulmonary disease. 2. Redemonstration of the mildly displaced right distal clavicular fracture. Electronically Signed   By: Keane Police D.O.   On: 04/07/2022 22:55   DG Pelvis Portable  Result Date: 04/07/2022 CLINICAL DATA:  Fall. EXAM: PORTABLE PELVIS 1-2 VIEWS COMPARISON:  None Available. FINDINGS: There is subcapital right femoral neck fracture with superolateral displacement of the femur. No appreciable pelvic fracture. Degenerate disc disease of the lower lumbar spine. Osteopenia. IMPRESSION: Subcapital right femoral neck fracture. Electronically Signed   By: Keane Police D.O.   On: 04/07/2022 22:54   DG Shoulder Right  Result Date: 04/07/2022 CLINICAL DATA:  Trauma, fall. EXAM: RIGHT SHOULDER - 2+ VIEW COMPARISON:  Chest radiograph dated April 04, 2022 FINDINGS: There is a mildly displaced fracture of the distal clavicle. Moderate acromioclavicular and glenohumeral osteoarthritis. Thoracic spondylosis and sternotomy wires were noted. Low lung volumes without evidence of pleural effusion or pneumothorax. IMPRESSION: 1. Mildly displaced fracture of the distal right clavicle. 2. Moderate acromioclavicular and glenohumeral osteoarthritis. Electronically Signed   By: Keane Police D.O.   On: 04/07/2022 22:51    Pending Labs Unresulted Labs (From admission, onward)     Start     Ordered   04/08/22 [id number]  Basic metabolic panel  Tomorrow morning,   R        04/08/22 0114   04/08/22 0500  CBC  Tomorrow morning,   R        04/08/22 0114   04/07/22 2215  Urinalysis, Routine w reflex microscopic -Urine, Unspecified Source  (Trauma Panel)  Once,   URGENT       Question:  Specimen Source  Answer:  Urine, Unspecified Source   04/07/22 2214            Vitals/Pain Today's Vitals   04/07/22 2315 04/07/22 2330 04/07/22 2345 04/08/22 0000  BP: (!) 140/87 132/84 126/77 132/81  Pulse: 86 87 90 84  Resp: 15 19 18  (!) 23  Temp:       TempSrc:      SpO2: 100% 98% 100% 97%  Weight:      Height:      PainSc:        Isolation Precautions No active isolations  Medications Medications  polyethylene glycol (MIRALAX / GLYCOLAX) packet 17 g (has no administration in time range)  senna-docusate (Senokot-S) tablet 1 tablet (has no administration in time range)  tamsulosin (FLOMAX) capsule 0.4 mg (has no administration in time range)  lactated ringers bolus 500 mL (has no administration in time range)  acetaminophen (TYLENOL) tablet 650 mg (has no administration in time range)  oxyCODONE (Oxy IR/ROXICODONE) immediate release tablet 5 mg (has no administration in time range)  HYDROmorphone (DILAUDID) injection 0.5-1 mg (has no administration in time range)  fentaNYL (SUBLIMAZE) injection 50 mcg (50 mcg Intravenous Given 04/07/22 2235)  fentaNYL (SUBLIMAZE) injection 50 mcg (50 mcg Intravenous Given 04/08/22 0040)  lactated ringers bolus 500 mL (500 mLs Intravenous New Bag/Given 04/08/22 0052)    Mobility non-ambulatory     Focused Assessments  Pt has fx Rt hip & Rt Shoulder.  She has a purwick  in place  She is alert & oriented   R Recommendations: See Admitting Provider Note  Report given to:   Additional Notes:

## 2022-04-08 NOTE — Assessment & Plan Note (Addendum)
Rate controlled. Transitioned from Warfarin to Heparin pending imminent surgical procedure. -Heparin per Pharmacy

## 2022-04-08 NOTE — H&P (Addendum)
Hospital Admission History and Physical Service Pager: [phone]  [name] Medical record number: [id number] Date of Birth: Jun 19, 1935 Age: 87 y.o. Gender: female  Primary Care Provider: Dickie La, MD Consultants: Orthopedic Surgery  Code Status: Full code   Preferred Emergency Contact:  Contact Information     Name Relation Home Work [place] Daughter [phone]         Chief Complaint: Fall  Assessment and Plan: [name] is a 87 y.o. female presenting s/p fall at home resulting in right femur and right clavicular fracture. She was recently hospitalized for a cardiac workup which was grossly normal.  Patient reports mechanism of the fall being related to leg numbness from sitting on the commode for a prolonged period of time. Cause of fall may be related to deconditioning and less likely cardiac in nature as patient denies CP or SOB prior to fall and with recent normal cards workup. Can also consider orthostatic hypotension due to tight BP control and 2/2 to Flomax. BP meds will need to be adjusted to prevent further falls. Patient denies any loss of consciousness.   Patient currently lives at home by herself, will most likely need CIR/SNF. Frail appearing and may have difficulties with rehab. Will need to start [place] conversations with her daughters this admission. Patient is not a great candidate for anticoagulation now s/p fall. However, with mechanical heart valve this may be unavoidable.   * Femur fracture, right ([place]) - Admit to FMTS, attending Dr. Ardelia Mems  - Med-surg, Vital signs per floor - Orthopedic surgery consulted, appreciate care  - NPO  - PT/OT to treat - VTE prophylaxis held pending surgical intervention  - Fluids: 500 cc LR bolus  - AM CBC/BMP  - Fall precautions - Warfarin to be reversed per pharmacy  - Pain control:   - Tylenol 1000 mg Q6H scheduled   - Oxycodone 5 mg Q4H PRN  - Dilaudid 0.5-1 mg Q4H for  breakthrough pain  Fall Recent cardiac workup WNL.   - orthostatic VS after surgery  - consider reducing BP meds to prevent further falls  - DG right knee ordered due to tenderness and pain on palpation  - GOC conversation needed   Clavicular fracture Mildly displaced right distal clavicular fracture  - pain control as above - monitor respiratory status  - ortho consulted  AKI (acute kidney injury) ([place]) Cr on admission 4.8 with baseline of ~2. Last Cr value 2.3 3 days ago. Patient reports compliance with Lasix 40 mg daily.   - BMP for the morning  - ordered 500 cc fluid bolus, if Cr improves consider starting gentle fluids  - monitor for volume overload  - nephrology consult in the AM  Hypertension BP meds currently held. Home meds include: Hydralazine 12.5 mg 3 times daily, Imdur 15 mg daily, metoprolol 100 mg in the morning and 50 mg in the evening. - monitor BP - restart metoprolol - will add Hydral back first for pressures 0000000 systolic   Chronic atrial fibrillation ([facility]) Currently rate controlled on 100 mg Toprol AM and 50 mg PM - restarted medications  - consider reducing dose to prevent hypotension  - warfarin to be reversed per pharmacy, will need to talk about long term anticoagulation. Will need heparin bridge if surgery later in day.      FEN/GI: NPO pending orthopedic intervention  VTE Prophylaxis: None, pharm consult to help with reversing warfarin, will consider heparin bridge  if prolonged interval before surgery. SCD's ordered  Disposition: med-surg  History of Present Illness:  [name] is a 87 y.o. female presenting s/p fall late yesterday 3/18. She reports getting up from the commode and feeling her legs give out due to numbness from sitting for prolonged period of time.  When she stood up she lost feeling in her legs and fell almost immediately.  She was able to call EMS to come pick her up and she was only down for approximately 20 to 30 minutes.   She denies loss of consciousness or hitting her head.   She was recently hospitalized for chest pain, prolonged Qtc. She was cleared from a cardiology standpoint and discharged home with reducing her metoprolol to 100 mg AM and 50 mg PM.   In the ED, VS were stable. She received x-rays of her chest, hip and shoulder revealing a right femur fracture and right mildly displaced right clavicular. CT head and neck were negative for acute pathology. Orthopedic surgrey was consulted in the ED and requested her Warfarin be reversed and for her to remain NPO pending intervention on 3/19. Labs were notable for an INR of 3.8, Cr of 4.80 (baseline ~2).   Review Of Systems: Per HPI with the following additions: as above  Pertinent Past Medical History: Chronic A-fib, dilated cardiomyopathy, GERD, GI bleed, hemorrhoid, hypertension, microcytic anemia, osteoporosis, right bundle branch block, hx of rheumatic mitral valve disease w/ artifical valve. Remainder reviewed in history tab.   Pertinent Past Surgical History: Cholecystectomy 2005 Hemicolectomy 2008 Mitral valve replacement 1987 Right and left heart cath 2023  Remainder reviewed in history tab.   Pertinent Social History: Tobacco use: Former, 1.5 PPD quit 1987 Alcohol use: Denies Other Substance use: Denies Lives by herself   Pertinent Family History: Mother: Hypertension, stroke Father: Prostate cancer Sister: Bladder cancer Remainder reviewed in history tab.   Important Outpatient Medications: Lasix 40 mg daily Hydralazine 12.5 mg 3 times daily Imdur 15 mg daily Toprol 100 mg daily Protonix 40 mg daily MiraLAX Flomax 0.4 mg daily Warfarin 5-10 mg daily as directed by coagulation clinic Remainder reviewed in medication history.   Objective: BP 132/81   Pulse 94   Temp 98.1 F (36.7 C)   Resp 18   Ht 5\' 8"  (1.727 m)   Wt 64.6 kg   SpO2 97%   BMI 21.65 kg/m  Exam: Chronically ill-appearing, frail, no acute  distress Cardio: Regular rate, irregularly irregular rhythm, no murmurs on exam. Pulm: Clear, no wheezing, no crackles. No increased work of breathing Abdominal: bowel sounds present, soft, non-tender, non-distended Extremities: mild swelling and severe tenderness in right knee, unable to move right arm due to pain Neuro: alert and oriented x3, speech normal in content, no facial asymmetry  Labs:  CBC BMET  Recent Labs  Lab 04/07/22 2356 04/08/22 0018  WBC 9.8  --   HGB 12.3 13.6  HCT 39.2 40.0  PLT 165  --    Recent Labs  Lab 04/07/22 2356 04/08/22 0018  NA 136 139  K 3.9 4.0  CL 105 104  CO2 23  --   BUN 64* 59*  CREATININE 4.12* 4.80*  GLUCOSE 147* 137*  CALCIUM 9.2  --     Pertinent additional labs INR 3.8, PT 37.5.   EKG: a fib, RBBB, rate controlled, Qtc 583  Imaging Studies Performed:  DG Pelvis:  There is subcapital right femoral neck fracture with superolateral displacement of the femur. No appreciable pelvic  fracture. Degenerate disc disease of the lower lumbar spine. Osteopenia.  DG Shoulder Right:  1. Mildly displaced fracture of the distal right clavicle. 2. Moderate acromioclavicular and glenohumeral osteoarthritis.  CXR:  1. Cardiomegaly.  No active cardiopulmonary disease. 2. Redemonstration of the mildly displaced right distal clavicular fracture.  CT Head and Neck WO Contrast:  1. No acute intracranial abnormality. No skull fracture. 2. Age related atrophy and chronic small vessel ischemic change.  1. No acute fracture or subluxation of the cervical spine. 2. Multilevel degenerative change. 3. Distal right clavicle fracture is partially included in the field of view.  Darci Current, DO 04/08/2022, 1:40 AM PGY-1, [facility] Intern pager: [phone], text pages welcome Secure chat group [place]   I was personally present and performed or re-performed the history, physical exam  and medical decision making activities of this service and have verified that the service and findings are accurately documented in the resident's note.  Holley Bouche, MD                  04/08/2022, 2:17 AM

## 2022-04-08 NOTE — Assessment & Plan Note (Addendum)
Will likely need acute rehab to regain strength and mobility. Continue to discuss safety planning if patient wishes to live alone. -D/c MIVF since PO intake advancing

## 2022-04-08 NOTE — Progress Notes (Signed)
CONSULT NOTE - Initial Consult  Pharmacy Consult for warfarin reversal Indication:  awaiting ortho surgery in setting of Afib/MVR  Allergies  Allergen Reactions   Iodinated Contrast Media Rash    Pt stated in the past had broken out in a rash on back and itching.    Esomeprazole Magnesium     REACTION: stomach upset, nausea    Patient Measurements: Height: 5\' 8"  (172.7 cm) Weight: 64.6 kg (142 lb 6.7 oz) IBW/kg (Calculated) : 63.9  Vital Signs: Temp: 98.1 F (36.7 C) (03/18 2254) Temp Source: Oral (03/18 1947) BP: 132/81 (03/19 0000) Pulse Rate: 84 (03/19 0000)  Labs: Recent Labs    04/07/22 2356 04/08/22 0018  HGB 12.3 13.6  HCT 39.2 40.0  PLT 165  --   LABPROT 37.5*  --   INR 3.8*  --   CREATININE 4.12* 4.80*    Estimated Creatinine Clearance: 8.8 mL/min (A) (by C-G formula based on SCr of 4.8 mg/dL (H)).   Medical History: Past Medical History:  Diagnosis Date   Asthma    years ago   Chronic atrial fibrillation ([facility])    Dilated cardiomyopathy ([place])    history of this, now resolved   Dysphagia    Hx   Fracture of tibial plateau 05/21/2015   GERD (gastroesophageal reflux disease)    GI bleed    Hemorrhoid 04/2014   bleeding   History of blood transfusion    Hypertension    Microcytic anemia    Nonsustained ventricular tachycardia ([facility])    Osteoporosis    Protein in urine    RBBB (right bundle branch block)    Rheumatic mitral valve disease     Assessment: 87yo female sustained fractures of R femur and R clavicle, awaiting ortho surgery consult for likely urgent procedure; pt is on Coumadin for Afib and SJ mechanical MVR; given high thrombosis risk, will use multi-factorial reversal and monitor closely for resuming AC.   Plan:  Vitamin K 2.5mg  IV x1 plus Kcentra 1500 units IV x1. Recheck INR. F/u anti-coag plans; would recommend heparin bridge if surgical procedure will be delayed.  Wynona Neat, PharmD, BCPS  04/08/2022,1:45 AM

## 2022-04-08 NOTE — Anesthesia Postprocedure Evaluation (Signed)
Anesthesia Post Note  Patient: [name]  Procedure(s) Performed: TOTAL HIP ARTHROPLASTY (Right: Hip)     Patient location during evaluation: PACU Anesthesia Type: General Level of consciousness: awake and alert Pain management: pain level controlled Vital Signs Assessment: post-procedure vital signs reviewed and stable Respiratory status: spontaneous breathing, nonlabored ventilation and respiratory function stable Cardiovascular status: blood pressure returned to baseline and stable Postop Assessment: no apparent nausea or vomiting Anesthetic complications: no   No notable events documented.  Last Vitals:  Vitals:   04/08/22 1545 04/08/22 1600  BP: 118/81 108/72  Pulse: 69 70  Resp: 18 16  Temp:  (!) 36.4 C  SpO2: 97% 98%    Last Pain:  Vitals:   04/08/22 1545  TempSrc:   PainSc: 0-No pain                 Lynda Rainwater

## 2022-04-08 NOTE — Assessment & Plan Note (Addendum)
Continues to be normotensive, unlikely to need additional home medications upon discharge and can follow up outpatient for BP management. -On Metoprolol 100 mg in the morning and 50 mg in the evening -Hold home: Hydralazine 12.5 mg 3 times daily, Imdur 15 mg daily

## 2022-04-08 NOTE — TOC CAGE-AID Note (Signed)
Transition of Care [facility] - CAGE-AID Screening   Patient Details  Name: [name] MRN: [id number] Date of Birth: 1935-05-16  Transition of Care [facility]) CM/SW Contact:    Army Melia, RN Phone Number:[phone] 04/08/2022, 6:17 AM   Clinical Narrative: No hx of drug/alcohol use, no resources indicated.   CAGE-AID Screening:    Have You Ever Felt You Ought to Cut Down on Your Drinking or Drug Use?: No Have People Annoyed You By Critizing Your Drinking Or Drug Use?: No Have You Felt Bad Or Guilty About Your Drinking Or Drug Use?: No Have You Ever Had a Drink or Used Drugs First Thing In The Morning to Steady Your Nerves or to Get Rid of a Hangover?: No CAGE-AID Score: 0  Substance Abuse Education Offered: No

## 2022-04-08 NOTE — Anesthesia Preprocedure Evaluation (Addendum)
Anesthesia Evaluation  Patient identified by MRN, date of birth, ID band Patient awake    Reviewed: Allergy & Precautions, H&P , NPO status , Patient's Chart, lab work & pertinent test results, reviewed documented beta blocker date and time   Airway Mallampati: II  TM Distance: >3 FB Neck ROM: Full    Dental no notable dental hx. (+) Teeth Intact, Dental Advisory Given   Pulmonary asthma , former smoker   Pulmonary exam normal breath sounds clear to auscultation       Cardiovascular hypertension, Pt. on medications and Pt. on home beta blockers +CHF  + dysrhythmias Atrial Fibrillation + Valvular Problems/Murmurs AS  Rhythm:Regular Rate:Normal     Neuro/Psych negative neurological ROS  negative psych ROS   GI/Hepatic Neg liver ROS,GERD  Medicated,,  Endo/Other  negative endocrine ROS    Renal/GU Renal InsufficiencyRenal disease  negative genitourinary   Musculoskeletal  (+) Arthritis , Osteoarthritis,    Abdominal   Peds  Hematology  (+) Blood dyscrasia, anemia   Anesthesia Other Findings   Reproductive/Obstetrics negative OB ROS                             Anesthesia Physical Anesthesia Plan  ASA: 3  Anesthesia Plan: General   Post-op Pain Management: Tylenol PO (pre-op)*   Induction: Intravenous  PONV Risk Score and Plan: 4 or greater and Ondansetron, Dexamethasone and Treatment may vary due to age or medical condition  Airway Management Planned: Oral ETT  Additional Equipment:   Intra-op Plan:   Post-operative Plan: Extubation in OR  Informed Consent: I have reviewed the patients History and Physical, chart, labs and discussed the procedure including the risks, benefits and alternatives for the proposed anesthesia with the patient or authorized representative who has indicated his/her understanding and acceptance.     Dental advisory given  Plan Discussed with:  CRNA  Anesthesia Plan Comments:        Anesthesia Quick Evaluation

## 2022-04-08 NOTE — Progress Notes (Addendum)
Daily Progress Note Intern Pager: [phone]  [name] Medical record number: [id number] Date of birth: 1935-08-25 Age: 87 y.o. Gender: female  Primary Care Provider: Dickie La, MD Consultants: Orthopedics Code Status: Full code  Pt Overview and Major Events to Date:  3/18: Admitted to FMTS  Assessment and Plan: [name] is a 87 y.o. female presenting s/p fall at home resulting in right femur and right clavicular fracture. PMHx includes chronic A-fib, dilated cardiomyopathy, GERD, GI bleed, hemorrhoid, HTN, microcytic anemia, osteoporosis, RBBB, rheumatic mitral valve disease.  * Femur fracture, right (HCC) XR showed subcapital right femoral neck fracture. Orthopedics to evaluate this AM for potential surgical intervention. -Orthopedic surgery consulted, appreciate care  -PT/OT following -Fall precautions -Heparin pre-op per Pharmacy - Pain control:   - Tylenol 1000 mg Q6H scheduled   - Oxycodone 5 mg Q4H PRN  - Dilaudid 0.5-1 mg Q4H for breakthrough pain  Fall Fall in the setting of deconditioning and frailty. Likely vasovagal given prolonged time on the toilet. Taking Lasix at home and has reportedly good PO intake. Currently lives alone, will need to consider acute rehab and potentially long term care. Patient not interested in ALF at this time. -Consider reducing BP meds to prevent further falls  -Gentle MIVF given NPO  Clavicular fracture Mildly displaced right distal clavicular fracture that occurred as result of the fall. -Pain control as above -Supportive care and sling as needed for comfort  Acute-on-chronic kidney injury ([place]) Improved to Cr 3.8 this AM. Baseline ~2.3. Likely prerenal in the setting of prolonged time down. -UA pending -Consider Nephrology consult if not improving  Hypertension Initially mildly hypertensive but now normotensive. Home meds include: Hydralazine 12.5 mg 3 times daily, Imdur 15 mg daily, metoprolol 100  mg in the morning and 50 mg in the evening. -On Metoprolol -Evaluate post-op for additional BP management  Chronic atrial fibrillation (HCC) Rate controlled. Transitioned from Warfarin to Heparin pending imminent surgical procedure. -Heparin per Pharmacy   FEN/GI: NPO pending surgical intervention PPx: Heparin Dispo: Pending surgical improvement and post-op care  Subjective:  Patient assessed at bedside, states she is a little uncomfortable with hip pain but is "trying not to move". States she lives in a senior community with call bell in the home but was on the floor a while after her fall trying to get to a phone. Expressed concern to patient, states she is not interested in living in a nursing home but would be okay with going to acute rehab.  Objective: Temp:  [97.3 F (36.3 C)-98.1 F (36.7 C)] 97.7 F (36.5 C) (03/19 1132) Pulse Rate:  [71-98] 74 (03/19 1132) Resp:  [14-26] 17 (03/19 1132) BP: (101-165)/(59-133) 115/59 (03/19 1132) SpO2:  [95 %-100 %] 99 % (03/19 1132) Weight:  [64.4 kg-64.6 kg] 64.4 kg (03/19 1132) Physical Exam: General: Elderly female, NAD Cardiovascular: Irregularly irregular rhythm. Normal rate. Loud systolic murmur Respiratory: CTAB. Normal WOB on RA Abdomen: Soft, non-tender, non-distended Extremities: Minimal peripheral edema MSK: Tender to palpation of R clavicle and R hip. Pain limits ROM  Laboratory: Most recent CBC Lab Results  Component Value Date   WBC 12.8 (H) 04/08/2022   HGB 11.6 (L) 04/08/2022   HCT 35.7 (L) 04/08/2022   MCV 88.6 04/08/2022   PLT 158 04/08/2022   Most recent BMP    Latest Ref Rng & Units 04/08/2022    4:21 AM  BMP  Glucose 70 - 99 mg/dL 146  BUN 8 - 23 mg/dL 67   Creatinine 0.44 - 1.00 mg/dL 3.84   Sodium 135 - 145 mmol/L 136   Potassium 3.5 - 5.1 mmol/L 4.0   Chloride 98 - 111 mmol/L 102   CO2 22 - 32 mmol/L 23   Calcium 8.9 - 10.3 mg/dL 9.2     Other pertinent labs: INR: 1.6 Lactic acid:  1.4  Imaging/Diagnostic Tests: DG Knee 1-2 Views Right Result Date: 04/08/2022 IMPRESSION: Tricompartment degenerative changes.  No acute bony abnormality.  CT Cervical Spine Wo Contrast Result Date: 04/07/2022 IMPRESSION: 1. No acute fracture or subluxation of the cervical spine. 2. Multilevel degenerative change. 3. Distal right clavicle fracture is partially included in the field of view.   CT HEAD WO CONTRAST (5MM) Result Date: 04/07/2022 IMPRESSION: 1. No acute intracranial abnormality. No skull fracture. 2. Age related atrophy and chronic small vessel ischemic change.  DG Chest Port 1 View Result Date: 04/07/2022 IMPRESSION: 1. Cardiomegaly.  No active cardiopulmonary disease. 2. Redemonstration of the mildly displaced right distal clavicular fracture.   DG Pelvis Portable Result Date: 04/07/2022 IMPRESSION: Subcapital right femoral neck fracture.   DG Shoulder Right Result Date: 04/07/2022 IMPRESSION: 1. Mildly displaced fracture of the distal right clavicle. 2. Moderate acromioclavicular and glenohumeral osteoarthritis.   Colletta Maryland, MD 04/08/2022, 12:07 PM  PGY-1, [place] Intern pager: [phone], text pages welcome Secure chat group [place]

## 2022-04-08 NOTE — Assessment & Plan Note (Deleted)
-   Admit to FMTS, attending Dr. Ardelia Mems  - Med-surg, Vital signs per floor - Orthopedic surgery consulted, appreciate care  - NPO  - PT/OT to treat - VTE prophylaxis held pending surgical intervention  - Fluids: 500 cc LR bolus  - AM CBC/BMP  - Fall precautions - Warfarin to be reversed per pharmacy  - Pain control:   - Tylenol 1000 mg Q6H scheduled   - Oxycodone 5 mg Q4H PRN  - Dilaudid 0.5-1 mg Q4H for breakthrough pain

## 2022-04-08 NOTE — Plan of Care (Signed)
  Problem: Clinical Measurements: Goal: Ability to maintain clinical measurements within normal limits will improve Outcome: Progressing Goal: Will remain free from infection Outcome: Progressing Goal: Respiratory complications will improve Outcome: Progressing   

## 2022-04-08 NOTE — Assessment & Plan Note (Addendum)
Patient intermittently using sling for comfort. Encourage mobility and ROM of R shoulder as tolerated.

## 2022-04-08 NOTE — Anesthesia Procedure Notes (Signed)
Procedure Name: Intubation Date/Time: 04/08/2022 1:08 PM  Performed by: Maude Leriche, CRNAPre-anesthesia Checklist: Patient identified, Emergency Drugs available, Suction available and Patient being monitored Patient Re-evaluated:Patient Re-evaluated prior to induction Oxygen Delivery Method: Circle system utilized Preoxygenation: Pre-oxygenation with 100% oxygen Induction Type: IV induction Ventilation: Mask ventilation without difficulty Laryngoscope Size: Miller and 2 Grade View: Grade II Tube type: Oral Tube size: 7.0 mm Number of attempts: 1 Airway Equipment and Method: Stylet and Bite block Placement Confirmation: ETT inserted through vocal cords under direct vision, positive ETCO2 and breath sounds checked- equal and bilateral Secured at: 21 cm Tube secured with: Tape Dental Injury: Teeth and Oropharynx as per pre-operative assessment

## 2022-04-08 NOTE — Op Note (Signed)
04/07/2022 - 04/08/2022  2:25 PM  PATIENT:  [name]   MRN: [id number]  PRE-OPERATIVE DIAGNOSIS:  HIP FRACTURE  POST-OPERATIVE DIAGNOSIS:  * No post-op diagnosis entered *  PROCEDURE:  Procedure(s): TOTAL HIP ARTHROPLASTY  PREOPERATIVE INDICATIONS:    [name] is an 87 y.o. female who has a diagnosis of Femur fracture, right ([place]) and elected for surgical management after failing conservative treatment.  The risks benefits and alternatives were discussed with the patient including but not limited to the risks of nonoperative treatment, versus surgical intervention including infection, bleeding, nerve injury, periprosthetic fracture, the need for revision surgery, dislocation, leg length discrepancy, blood clots, cardiopulmonary complications, morbidity, mortality, among others, and they were willing to proceed.     OPERATIVE REPORT     SURGEON:   Renette Butters, MD    ASSISTANT:  Aggie Moats, PA-C, she was present and scrubbed throughout the case, critical for completion in a timely fashion, and for retraction, instrumentation, and closure.     ANESTHESIA:  General    COMPLICATIONS:  None.     COMPONENTS:  [employer]ker acolade fit femur size 7 with a 36 mm -5 head ball and an acetabular shell size 52 with a  polyethylene liner    PROCEDURE IN DETAIL:   The patient was met in the holding area and  identified.  The appropriate hip was identified and marked at the operative site.  The patient was then transported to the OR  and  placed under anesthesia per that record.  At that point, the patient was  placed in the supine position and  secured to the operating room table and all bony prominences padded. He received pre-operative antibiotics    The operative lower extremity was prepped from the iliac crest to the distal leg.  Sterile draping was performed.  Time out was performed prior to incision.      Skin incision was made just 2 cm lateral to the ASIS  extending in line  with the tensor fascia lata. Electrocautery was used to control all bleeders. I dissected down sharply to the fascia of the tensor fascia lata was confirmed that the muscle fibers beneath were running posteriorly. I then incised the fascia over the superficial tensor fascia lata in line with the incision. The fascia was elevated off the anterior aspect of the muscle the muscle was retracted posteriorly and protected throughout the case. I then used electrocautery to incise the tensor fascia lata fascia control and all bleeders. Immediately visible was the fat over top of the anterior neck and capsule.  I removed the anterior fat from the capsule and elevated the rectus muscle off of the anterior capsule. I then removed a large time of capsule. The retractors were then placed over the anterior acetabulum as well as around the superior and inferior neck.  I then freshened the femoral neck cut. Then used the power corkscrew to remove the femoral head from the acetabulum and thoroughly irrigated the acetabulum. I sized the femoral head.    I then exposed the deep acetabulum, cleared out any tissue including the ligamentum teres.   After adequate visualization, I excised the labrum, and then sequentially reamed.  I then impacted the acetabular implant into place using fluoroscopy for guidance.  Appropriate version and inclination was confirmed clinically matching their bony anatomy, and with fluoroscopy.  I placed a 20 mm screw in the posterior/superio position with an excellent bite.    I then placed the polyethylene  liner in place  I then adducted the leg and released the external rotators from the posterior femur allowing it to be easily delivered up lateral and anterior to the acetabulum for preparation of the femoral canal.    I then prepared the proximal femur using the cookie-cutter and then sequentially reamed and broached.  A trial broach, neck, and head was utilized, and I reduced the hip and  used floroscopy to assess the neck length and femoral implant.  I then impacted the femoral prosthesis into place into the appropriate version. The hip was then reduced and fluoroscopy confirmed appropriate position. Leg lengths were restored.  I then irrigated the hip copiously again with, and repaired the fascia with Vicryl, followed by monocryl for the subcutaneous tissue, Monocryl for the skin, Steri-Strips and sterile gauze. The patient was then awakened and returned to PACU in stable and satisfactory condition. There were no complications.  POST OPERATIVE PLAN: WBAT, DVT px: SCD's/TED, ambulation and chemical dvt px  Tami Lynch, MD Orthopedic Surgeon [phone]

## 2022-04-08 NOTE — Plan of Care (Signed)

## 2022-04-08 NOTE — Assessment & Plan Note (Addendum)
Improved to Cr 3.8 this AM. Baseline ~2.3. Likely prerenal in the setting of prolonged time down. -UA pending -Consider Nephrology consult if not improving

## 2022-04-08 NOTE — Consult Note (Signed)
Reason for Consult:Right hip/clavicle fxs Referring Physician: Chrisandra Netters Time called: [id number] Time at bedside: [name] is an 87 y.o. female.  HPI: [name] got up off the toilet and misstepped. This caused her to lose her balance and fall. She had immediate right hip and shoulder pain and could not get up. She was brought to the ED where x-rays showed right hip and clavicle fxs and orthopedic surgery was consulted. She lives alone and generally uses a cane or RW to ambulate.  Past Medical History:  Diagnosis Date   Asthma    years ago   Chronic atrial fibrillation ([place])    Dilated cardiomyopathy ([place])    history of this, now resolved   Dysphagia    Hx   Fracture of tibial plateau 05/21/2015   GERD (gastroesophageal reflux disease)    GI bleed    Hemorrhoid 04/2014   bleeding   History of blood transfusion    Hypertension    Microcytic anemia    Nonsustained ventricular tachycardia (HCC)    Osteoporosis    Protein in urine    RBBB (right bundle branch block)    Rheumatic mitral valve disease     Past Surgical History:  Procedure Laterality Date   CHOLECYSTECTOMY  2005   COLONOSCOPY     COLONOSCOPY W/ POLYPECTOMY     EYE SURGERY  04/2014   HEMICOLECTOMY  2008   forpolyps   MITOMYCIN C APPLICATION Right [id number]   Procedure: MITOMYCIN C APPLICATION;  Surgeon: Marylynn Pearson, MD;  Location: [facility];  Service: Ophthalmology;  Laterality: Right;   MITRAL VALVE REPLACEMENT  1987   St Jude mechanical valve   RIGHT/LEFT HEART CATH AND CORONARY ANGIOGRAPHY N/A 04/02/2021   Procedure: RIGHT/LEFT HEART CATH AND CORONARY ANGIOGRAPHY;  Surgeon: Jolaine Artist, MD;  Location: [facility];  Service: Cardiovascular;  Laterality: N/A;   TRABECULECTOMY Right 05/17/2014   Procedure: TRABECULECTOMY WITH MITOMYCIN RIGHT EYE;  Surgeon: Marylynn Pearson, MD;  Location: [facility];  Service: Ophthalmology;  Laterality: Right;   TUBAL LIGATION     US ECHOCARDIOGRAPHY  02/2008,  03/2006, 06/2004    Family History  Problem Relation Age of Onset   Cancer Father        prostate cancer   Hypertension Mother    Stroke Mother        CVA x 2   Cancer Sister        in the Bladder   Endometriosis Daughter    Stroke Sister    Blindness Sister    Hypertension Sister    Thyroid disease Daughter    Hypertension Daughter    Hypertension Daughter    Hypertension Daughter    Liver disease Daughter    Colon cancer Neg Hx    Breast cancer Neg Hx     Social History:  reports that she quit smoking about 37 years ago. Her smoking use included cigarettes. She has a 22.50 pack-year smoking history. She has never used smokeless tobacco. She reports that she does not drink alcohol and does not use drugs.  Allergies:  Allergies  Allergen Reactions   Iodinated Contrast Media Rash    Pt stated in the past had broken out in a rash on back and itching.    Esomeprazole Magnesium     REACTION: stomach upset, nausea    Medications: I have reviewed the patient's current medications.  Results for orders placed or performed during the hospital encounter of 04/07/22 (from  the past 48 hour(s))  CBC with Differential     Status: Abnormal   Collection Time: 04/07/22 11:56 PM  Result Value Ref Range   WBC 9.8 4.0 - 10.5 K/uL   RBC 4.33 3.87 - 5.11 MIL/uL   Hemoglobin 12.3 12.0 - 15.0 g/dL   HCT 39.2 36.0 - 46.0 %   MCV 90.5 80.0 - 100.0 fL   MCH 28.4 26.0 - 34.0 pg   MCHC 31.4 30.0 - 36.0 g/dL   RDW 15.8 (H) 11.5 - 15.5 %   Platelets 165 150 - 400 K/uL   nRBC 0.0 0.0 - 0.2 %   Neutrophils Relative % 79 %   Neutro Abs 7.7 1.7 - 7.7 K/uL   Lymphocytes Relative 14 %   Lymphs Abs 1.4 0.7 - 4.0 K/uL   Monocytes Relative 6 %   Monocytes Absolute 0.6 0.1 - 1.0 K/uL   Eosinophils Relative 0 %   Eosinophils Absolute 0.0 0.0 - 0.5 K/uL   Basophils Relative 0 %   Basophils Absolute 0.0 0.0 - 0.1 K/uL   Immature Granulocytes 1 %   Abs Immature Granulocytes 0.06 0.00 - 0.07 K/uL     Comment: Performed at [facility], [address], [place], [place] [address]  Protime-INR     Status: Abnormal   Collection Time: 04/07/22 11:56 PM  Result Value Ref Range   Prothrombin Time 37.5 (H) 11.4 - 15.2 seconds   INR 3.8 (H) 0.8 - 1.2    Comment: (NOTE) INR goal varies based on device and disease states. Performed at [facility], [place] [address] [place], [place] [address]   Comprehensive metabolic panel     Status: Abnormal   Collection Time: 04/07/22 11:56 PM  Result Value Ref Range   Sodium 136 135 - 145 mmol/L   Potassium 3.9 3.5 - 5.1 mmol/L   Chloride 105 98 - 111 mmol/L   CO2 23 22 - 32 mmol/L   Glucose, Bld 147 (H) 70 - 99 mg/dL    Comment: Glucose reference range applies only to samples taken after fasting for at least 8 hours.   BUN 64 (H) 8 - 23 mg/dL   Creatinine, Ser 4.12 (H) 0.44 - 1.00 mg/dL   Calcium 9.2 8.9 - 10.3 mg/dL   Total Protein 7.6 6.5 - 8.1 g/dL   Albumin 3.7 3.5 - 5.0 g/dL   AST 36 15 - 41 U/L   ALT 37 0 - 44 U/L   Alkaline Phosphatase 78 38 - 126 U/L   Total Bilirubin 0.8 0.3 - 1.2 mg/dL   GFR, Estimated 10 (L) >60 mL/min    Comment: (NOTE) Calculated using the CKD-EPI Creatinine Equation (2021)    Anion gap 8 5 - 15    Comment: Performed at [facility] [address], [place], [place] [address]  Lactic acid, plasma     Status: None   Collection Time: 04/07/22 11:56 PM  Result Value Ref Range   Lactic Acid, Venous 1.4 0.5 - 1.9 mmol/L    Comment: Performed at [facility] [address] [place], [place] [address]  Sample to Blood Bank     Status: None   Collection Time: 04/07/22 11:56 PM  Result Value Ref Range   Blood Bank Specimen SAMPLE AVAILABLE FOR TESTING    Sample Expiration      04/10/2022,2359 Performed at [facility], [place] [address], [place], [place] [address]   I-Stat Chem 8,  ED     Status: Abnormal   Collection Time: 04/08/22 12:18 AM  Result Value Ref Range   Sodium 139 135 - 145  mmol/L   Potassium 4.0 3.5 - 5.1 mmol/L   Chloride 104 98 - 111 mmol/L   BUN 59 (H) 8 - 23 mg/dL   Creatinine, Ser 4.80 (H) 0.44 - 1.00 mg/dL   Glucose, Bld 137 (H) 70 - 99 mg/dL    Comment: Glucose reference range applies only to samples taken after fasting for at least 8 hours.   Calcium, Ion 1.24 1.15 - 1.40 mmol/L   TCO2 22 22 - 32 mmol/L   Hemoglobin 13.6 12.0 - 15.0 g/dL   HCT 40.0 36.0 - [id number] %  Basic metabolic panel     Status: Abnormal   Collection Time: 04/08/22  4:21 AM  Result Value Ref Range   Sodium 136 135 - 145 mmol/L   Potassium 4.0 3.5 - 5.1 mmol/L   Chloride 102 98 - 111 mmol/L   CO2 23 22 - 32 mmol/L   Glucose, Bld 146 (H) 70 - 99 mg/dL    Comment: Glucose reference range applies only to samples taken after fasting for at least 8 hours.   BUN 67 (H) 8 - 23 mg/dL   Creatinine, Ser 3.84 (H) 0.44 - 1.00 mg/dL   Calcium 9.2 8.9 - 10.3 mg/dL   GFR, Estimated 11 (L) >60 mL/min    Comment: (NOTE) Calculated using the CKD-EPI Creatinine Equation (2021)    Anion gap 11 5 - 15    Comment: Performed at [facility] [address], [place], [place] [address]  CBC     Status: Abnormal   Collection Time: 04/08/22  4:21 AM  Result Value Ref Range   WBC 12.8 (H) 4.0 - 10.5 K/uL   RBC 4.03 3.87 - 5.11 MIL/uL   Hemoglobin 11.6 (L) 12.0 - 15.0 g/dL   HCT 35.7 (L) 36.0 - 46.0 %   MCV 88.6 80.0 - 100.0 fL   MCH 28.8 26.0 - 34.0 pg   MCHC 32.5 30.0 - 36.0 g/dL   RDW 15.9 (H) 11.5 - 15.5 %   Platelets 158 150 - 400 K/uL   nRBC 0.0 0.0 - 0.2 %    Comment: Performed at [facility], [place] [address], [place], [place] [address]  Protime-INR     Status: Abnormal   Collection Time: 04/08/22  4:21 AM  Result Value Ref Range   Prothrombin Time 19.2 (H) 11.4 - 15.2 seconds   INR 1.6 (H) 0.8 - 1.2    Comment: (NOTE) INR goal varies based on device and disease states. Performed at [facility], [place] [address], [place], [place] [address]     DG Knee 1-2 Views  Right  Result Date: 04/08/2022 CLINICAL DATA:  Fall EXAM: RIGHT KNEE - 1-2 VIEW COMPARISON:  None Available. FINDINGS: Moderate tricompartment degenerative changes with joint space narrowing and spurring. No acute bony abnormality. Specifically, no fracture, subluxation, or dislocation. No joint effusion. IMPRESSION: Tricompartment degenerative changes.  No acute bony abnormality. Electronically Signed   By: Rolm Baptise M.D.   On: 04/08/2022 01:58   CT Cervical Spine Wo Contrast  Result Date: 04/07/2022 CLINICAL DATA:  Fall prior to arrival. EXAM: CT CERVICAL SPINE WITHOUT CONTRAST TECHNIQUE: Multidetector CT imaging of the cervical spine was performed without intravenous contrast. Multiplanar CT image reconstructions were also generated. RADIATION DOSE REDUCTION: This exam was performed according to the departmental  dose-optimization program which includes automated exposure control, adjustment of the mA and/or kV according to patient size and/or use of iterative reconstruction technique. COMPARISON:  None Available. FINDINGS: Alignment: Normal. Skull base and vertebrae: No acute fracture. Vertebral body heights are maintained. The dens and skull base are intact. Soft tissues and spinal canal: No prevertebral fluid or swelling. No visible canal hematoma. Disc levels: Mild diffuse degenerative disc disease and facet hypertrophy. Upper chest: Distal right clavicle fracture is partially included. Other: Carotid calcifications IMPRESSION: 1. No acute fracture or subluxation of the cervical spine. 2. Multilevel degenerative change. 3. Distal right clavicle fracture is partially included in the field of view. Electronically Signed   By: Keith Rake M.D.   On: 04/07/2022 23:14   CT HEAD WO CONTRAST (5MM)  Result Date: 04/07/2022 CLINICAL DATA:  Fall prior to arrival. EXAM: CT HEAD WITHOUT CONTRAST TECHNIQUE: Contiguous axial images were obtained from the base of the skull through the vertex without  intravenous contrast. RADIATION DOSE REDUCTION: This exam was performed according to the departmental dose-optimization program which includes automated exposure control, adjustment of the mA and/or kV according to patient size and/or use of iterative reconstruction technique. COMPARISON:  Remote head CT 10/26/2010 FINDINGS: Brain: No intracranial hemorrhage, mass effect, or midline shift. Age related atrophy and chronic small vessel ischemic change. No hydrocephalus. The basilar cisterns are patent. No evidence of territorial infarct or acute ischemia. No extra-axial or intracranial fluid collection. Vascular: Atherosclerosis of skullbase vasculature without hyperdense vessel or abnormal calcification. Skull: No fracture or focal lesion. Sinuses/Orbits: Remote nasal bone fracture which was seen on prior exam. No acute findings. Bilateral cataract resection. Other: No confluent scalp hematoma. IMPRESSION: 1. No acute intracranial abnormality. No skull fracture. 2. Age related atrophy and chronic small vessel ischemic change. Electronically Signed   By: Keith Rake M.D.   On: 04/07/2022 23:09   DG Chest Port 1 View  Result Date: 04/07/2022 CLINICAL DATA:  Trauma, fall. EXAM: PORTABLE CHEST 1 VIEW COMPARISON:  Chest radiograph dated April 04, 2022 FINDINGS: The heart is enlarged. Aortic atherosclerotic calcification with tortuous course. Lungs are clear without evidence of focal consolidation or pleural effusion. Redemonstration of the mildly displaced right distal clavicular fracture and advanced osteoarthritis of the acromioclavicular joint. IMPRESSION: 1. Cardiomegaly.  No active cardiopulmonary disease. 2. Redemonstration of the mildly displaced right distal clavicular fracture. Electronically Signed   By: Keane Police D.O.   On: 04/07/2022 22:55   DG Pelvis Portable  Result Date: 04/07/2022 CLINICAL DATA:  Fall. EXAM: PORTABLE PELVIS 1-2 VIEWS COMPARISON:  None Available. FINDINGS: There is  subcapital right femoral neck fracture with superolateral displacement of the femur. No appreciable pelvic fracture. Degenerate disc disease of the lower lumbar spine. Osteopenia. IMPRESSION: Subcapital right femoral neck fracture. Electronically Signed   By: Keane Police D.O.   On: 04/07/2022 22:54   DG Shoulder Right  Result Date: 04/07/2022 CLINICAL DATA:  Trauma, fall. EXAM: RIGHT SHOULDER - 2+ VIEW COMPARISON:  Chest radiograph dated April 04, 2022 FINDINGS: There is a mildly displaced fracture of the distal clavicle. Moderate acromioclavicular and glenohumeral osteoarthritis. Thoracic spondylosis and sternotomy wires were noted. Low lung volumes without evidence of pleural effusion or pneumothorax. IMPRESSION: 1. Mildly displaced fracture of the distal right clavicle. 2. Moderate acromioclavicular and glenohumeral osteoarthritis. Electronically Signed   By: Keane Police D.O.   On: 04/07/2022 22:51    Review of Systems  HENT:  Negative for ear discharge, ear pain, hearing loss and tinnitus.  Eyes:  Negative for photophobia and pain.  Respiratory:  Negative for cough and shortness of breath.   Cardiovascular:  Negative for chest pain.  Gastrointestinal:  Negative for abdominal pain, nausea and vomiting.  Genitourinary:  Negative for dysuria, flank pain, frequency and urgency.  Musculoskeletal:  Positive for arthralgias (Right hip and shoulder) and neck pain. Negative for back pain and myalgias.  Neurological:  Negative for dizziness and headaches.  Hematological:  Does not bruise/bleed easily.  Psychiatric/Behavioral:  The patient is not nervous/anxious.    Blood pressure 109/75, pulse 91, temperature 98.1 F (36.7 C), resp. rate 16, height 5\' 8"  (1.727 m), weight 64.6 kg, SpO2 96 %. Physical Exam Constitutional:      General: She is not in acute distress.    Appearance: She is well-developed. She is not diaphoretic.  HENT:     Head: Normocephalic and atraumatic.  Eyes:     General:  No scleral icterus.       Right eye: No discharge.        Left eye: No discharge.     Conjunctiva/sclera: Conjunctivae normal.  Cardiovascular:     Rate and Rhythm: Normal rate and regular rhythm.  Pulmonary:     Effort: Pulmonary effort is normal. No respiratory distress.  Musculoskeletal:     Cervical back: Normal range of motion.     Comments: Right shoulder, elbow, wrist, digits- no skin wounds, sling in place, no instability, no blocks to motion  Sens  Ax/R/M/U intact  Mot   Ax/ R/ PIN/ M/ AIN/ U intact  Rad 2+  RLE No traumatic wounds, ecchymosis, or rash  Mod TTP hip  No knee or ankle effusion  Knee stable to varus/ valgus and anterior/posterior stress  Sens DPN, SPN, TN intact  Motor EHL, ext, flex, evers 5/5  DP 2+, PT 2+, No significant edema  Skin:    General: Skin is warm and dry.  Neurological:     Mental Status: She is alert.  Psychiatric:        Mood and Affect: Mood normal.        Behavior: Behavior normal.    Assessment/Plan: Right hip fx -- Plan THA with Dr. Percell Miller today. Please keep NPO. Right clavicle fx -- Plan non-operative management with NWB in sling Multiple medical problems including afib on coumadin, HTN, and AKI -- per primary service    Lisette Abu, PA-C Orthopedic Surgery [phone] 04/08/2022, 8:50 AM

## 2022-04-09 ENCOUNTER — Encounter (HOSPITAL_COMMUNITY): Payer: Self-pay | Admitting: Orthopedic Surgery

## 2022-04-09 DIAGNOSIS — N179 Acute kidney failure, unspecified: Secondary | ICD-10-CM | POA: Diagnosis not present

## 2022-04-09 LAB — GLUCOSE, CAPILLARY
Glucose-Capillary: 132 mg/dL — ABNORMAL HIGH (ref 70–99)
Glucose-Capillary: 112 mg/dL — ABNORMAL HIGH (ref 70–99)
Glucose-Capillary: 126 mg/dL — ABNORMAL HIGH (ref 70–99)

## 2022-04-09 LAB — URINALYSIS, ROUTINE W REFLEX MICROSCOPIC
Bilirubin Urine: NEGATIVE
Glucose, UA: NEGATIVE mg/dL
Hgb urine dipstick: NEGATIVE
Ketones, ur: NEGATIVE mg/dL
Nitrite: NEGATIVE
Protein, ur: >=300 mg/dL — AB
Specific Gravity, Urine: 1.031 — ABNORMAL HIGH (ref 1.005–1.030)
WBC, UA: >50 WBC/hpf (ref 0–5)
pH: 6 (ref 5.0–8.0)

## 2022-04-09 LAB — BASIC METABOLIC PANEL
Anion gap: 14 (ref 5–15)
BUN: 68 mg/dL — ABNORMAL HIGH (ref 8–23)
CO2: 18 mmol/L — ABNORMAL LOW (ref 22–32)
Calcium: 8.6 mg/dL — ABNORMAL LOW (ref 8.9–10.3)
Chloride: 104 mmol/L (ref 98–111)
Creatinine, Ser: 3.63 mg/dL — ABNORMAL HIGH (ref 0.44–1.00)
GFR, Estimated: 12 mL/min — ABNORMAL LOW (ref >60)
Glucose, Bld: 143 mg/dL — ABNORMAL HIGH (ref 70–99)
Potassium: 4 mmol/L (ref 3.5–5.1)
Sodium: 136 mmol/L (ref 135–145)

## 2022-04-09 LAB — CBC
HCT: 33.2 % — ABNORMAL LOW (ref 36.0–46.0)
Hemoglobin: 10.2 g/dL — ABNORMAL LOW (ref 12.0–15.0)
MCH: 28 pg (ref 26.0–34.0)
MCHC: 30.7 g/dL (ref 30.0–36.0)
MCV: 91.2 fL (ref 80.0–100.0)
Platelets: 112 10*3/uL — ABNORMAL LOW (ref 150–400)
RBC: 3.64 MIL/uL — ABNORMAL LOW (ref 3.87–5.11)
RDW: 16.3 % — ABNORMAL HIGH (ref 11.5–15.5)
WBC: 9.8 10*3/uL (ref 4.0–10.5)
nRBC: 0 % (ref 0.0–0.2)

## 2022-04-09 LAB — PROTIME-INR
INR: 1.4 — ABNORMAL HIGH (ref 0.8–1.2)
Prothrombin Time: 17 seconds — ABNORMAL HIGH (ref 11.4–15.2)

## 2022-04-09 LAB — HEPARIN LEVEL (UNFRACTIONATED): Heparin Unfractionated: 0.33 IU/mL (ref 0.30–0.70)

## 2022-04-09 MED ORDER — SODIUM CHLORIDE 0.9 % IV BOLUS
500.0000 mL | Freq: Once | INTRAVENOUS | Status: AC
  Administered 2022-04-09: 500 mL via INTRAVENOUS

## 2022-04-09 NOTE — Progress Notes (Addendum)
Daily Progress Note Intern Pager: [phone]  [name] Medical record number: [id number] Date of birth: 12-Mar-1935 Age: 87 y.o. Gender: female  Primary Care Provider: Dickie La, MD Consultants: Orthopedics Code Status: Full code   Pt Overview and Major Events to Date:  3/18: Admitted to [facility]MTS 3/19: R THA   Assessment and Plan: [name] is a 87 y.o. female presenting s/p fall at home resulting in right femur and right clavicular fracture. PMHx includes chronic A-fib, dilated cardiomyopathy, GERD, GI bleed, hemorrhoid, HTN, microcytic anemia, osteoporosis, RBBB, rheumatic mitral valve disease.  * Femur fracture, right ([place]) S/p R THA with orthopedics yesterday. Pain well controlled post op and has not required any prn pain medications. Hgb 10.2, stable. Needs to be seen by PT/OT today for rehab planning. -Orthopedic surgery consulted, appreciate care  -PT/OT following -Fall precautions -Heparin to Warfarin bridge for DVT prophylaxis -Pain control: Tylenol 1000 mg q6h sch, Oxycodone 5 mg q4h prn -D/c Tramadol 50mg  q6h sch due to kidney function  Fall Will likely need acute rehab to regain strength and mobility. Continue to discuss safety planning if patient wishes to live alone. -D/c MIVF since PO intake advancing  Closed nondisplaced fracture of lateral end of right clavicle Mildly displaced right distal clavicular fracture that occurred as result of the fall. -Pain control as above -Supportive care and sling as needed for comfort  Acute-on-chronic kidney injury ([place]) Improved to Cr 3.63 this AM. Baseline ~2.3. -UA pending -500cc LR bolus -Consider Nephrology consult if not improving  Hypertension Normotensive overnight. -On Metoprolol 100 mg in the morning and 50 mg in the evening -Hold home: Hydralazine 12.5 mg 3 times daily, Imdur 15 mg daily  Chronic atrial fibrillation (HCC) Rate controlled. On Heparin currently but bridging to  Warfarin today per Pharmacy -Heparin to Warfarin bridge   FEN/GI: Heart healthy PPx: Heparin Dispo: Pending surgical improvement and post-op care  Subjective:  Patient assessed at bedside, reports she has minimal to no pain. States Rn got her up this morning to bath and she felt pretty good. States she is drinking fluids but eats less because food tends to "go right through her".  Objective: Temp:  [97.2 F (36.2 C)-98 F (36.7 C)] 97.2 F (36.2 C) (03/20 1208) Pulse Rate:  [66-81] 69 (03/20 1208) Resp:  [13-19] 18 (03/20 1208) BP: (101-131)/(61-84) 109/75 (03/20 1208) SpO2:  [93 %-100 %] 99 % (03/20 0802) Physical Exam: General: Sitting up in bed, alert Cardiovascular: Irregularly irregular. Regular rate. Loud systolic murmur Respiratory: CTAB. Normal WOB on RA Abdomen: Soft, non-tender, non-distended Extremities: No peripheral edema. Bandage over R hip clean and dry without surrounding erythema or edema  Laboratory: Most recent CBC Lab Results  Component Value Date   WBC 9.8 04/09/2022   HGB 10.2 (L) 04/09/2022   HCT 33.2 (L) 04/09/2022   MCV 91.2 04/09/2022   PLT 112 (L) 04/09/2022   Most recent BMP    Latest Ref Rng & Units 04/09/2022    2:18 AM  BMP  Glucose 70 - 99 mg/dL 143   BUN 8 - 23 mg/dL 68   Creatinine 0.44 - 1.00 mg/dL 3.63   Sodium 135 - 145 mmol/L 136   Potassium 3.5 - 5.1 mmol/L 4.0   Chloride 98 - 111 mmol/L 104   CO2 22 - 32 mmol/L 18   Calcium 8.9 - 10.3 mg/dL 8.6     Other pertinent labs: INR: 1.4   Colletta Maryland, MD  04/09/2022, 12:25 PM  PGY-1, [place] Intern pager: [phone], text pages welcome Secure chat group [place]

## 2022-04-09 NOTE — Evaluation (Signed)
Physical Therapy Evaluation Patient Details Name: [name] MRN: [id number] DOB: 08-16-1935 Today's Date: 04/09/2022  History of Present Illness  87 y.o. female presenting 3/18 with R hip and shoulder pain after a fall. Found to have R clavicle and R hip fractures. Now s/p R total hip arthroplasty 3/19. Recent admission 3/15-16 with RBB. PMH includes: dilated CM, HFrEF, AVR on Coumadin, chronic Afib.  Clinical Impression  Pt is presenting below baseline. Currently she requires 2 person assist for bed mobility, transfers and was unable to progress gait. BP dropped with standing over short period of time and pt took increased time (~3 min) to recover. Due to pt current functional status, home set up and available assistance at home recommending skilled physical therapy services at a higher level of care and frequency on discharge from acute care hospital setting in order to decrease risk for falls, immobility, injury and re-hospitalization.        Recommendations for follow up therapy are one component of a multi-disciplinary discharge planning process, led by the attending physician.  Recommendations may be updated based on patient status, additional functional criteria and insurance authorization.  Follow Up Recommendations Skilled nursing-short term rehab (<3 hours/day) Can patient physically be transported by private vehicle: Yes    Assistance Recommended at Discharge Frequent or constant Supervision/Assistance  Patient can return home with the following  Assistance with cooking/housework;Assist for transportation;Help with stairs or ramp for entrance;Two people to help with walking and/or transfers    Equipment Recommendations Other (comment) (defer to post acute)  Recommendations for Other Services       Functional Status Assessment Patient has had a recent decline in their functional status and demonstrates the ability to make significant improvements in function in a reasonable  and predictable amount of time.     Precautions / Restrictions Precautions Precautions: Fall Precaution Comments: HOH Restrictions Weight Bearing Restrictions: Yes RUE Weight Bearing: Non weight bearing RLE Weight Bearing: Weight bearing as tolerated Other Position/Activity Restrictions: can wgtbear through the R elbow      Mobility  Bed Mobility Overal bed mobility: Needs Assistance Bed Mobility: Supine to Sit     Supine to sit: Mod assist, +2 for safety/equipment, +2 for physical assistance     General bed mobility comments: Pt needed Min A to get LE off EOB and Mod A at the trunk to get to full upright sitting position. Patient Response: Cooperative  Transfers Overall transfer level: Needs assistance Equipment used: 1 person hand held assist Transfers: Sit to/from Stand, Bed to chair/wheelchair/BSC Sit to Stand: Mod assist, +2 physical assistance, +2 safety/equipment   Step pivot transfers: +2 physical assistance, +2 safety/equipment, Mod assist       General transfer comment: pt requires Mod A for momentum to get to standing from sitting and Mod A +2 to take small steps toward chair with poor WB through RLE and progression of RLE.    Ambulation/Gait Ambulation/Gait assistance: Mod assist, +2 physical assistance, +2 safety/equipment Gait Distance (Feet): 2 Feet Assistive device: 1 person hand held assist   Gait velocity: Decreased cadence.     General Gait Details: Pt only took a few steps from EOB toward Recliner with 2 person assistance. Pt R knee buckled initially with WB trying to progress the LLE forward requiring Mod a to maintian upright balance.       Balance Overall balance assessment: Needs assistance Sitting-balance support: No upper extremity supported, Feet supported Sitting balance-Leahy Scale: Fair Sitting balance - Comments: No LOB  Standing balance support: During functional activity, Reliant on assistive device for balance, Single  extremity supported Standing balance-Leahy Scale: Zero Standing balance comment: single UE support and support at pelvis to maintain upright balance due to WB precautions on the RLE. Mod A +2 to maintain upright balance.       Pertinent Vitals/Pain Pain Assessment Pain Assessment: No/denies pain    Home Living Family/patient expects to be discharged to:: Private residence Living Arrangements: Alone Available Help at Discharge: Family;Available 24 hours/day Type of Home: Apartment Home Access: Level entry       Home Layout: One level Home Equipment: Rollator (4 wheels);Cane - single point;Shower seat Additional Comments: senior living apt    Prior Function Prior Level of Function : Independent/Modified Independent             Mobility Comments: pt reports use of SPC without LOB ADLs Comments: pt reports not driving, but is otherwise independent with ADLs and IADL     Hand Dominance   Dominant Hand: Right    Extremity/Trunk Assessment   Upper Extremity Assessment Upper Extremity Assessment: Defer to OT evaluation    Lower Extremity Assessment Lower Extremity Assessment: Generalized weakness    Cervical / Trunk Assessment Cervical / Trunk Assessment: Normal;Kyphotic  Communication   Communication: HOH  Cognition Arousal/Alertness: Awake/alert Behavior During Therapy: WFL for tasks assessed/performed Overall Cognitive Status: Difficult to assess       General Comments: limited by [facility] at times, pt slow to respond or needing cues repeated        General Comments General comments (skin integrity, edema, etc.): pt was reporting dizziness with eyes closing sitting in recliner at end of session. Reclined back with legs elevated  BP soft, pt reporting improvement after ~3 min. RN notified.        Assessment/Plan    PT Assessment Patient needs continued PT services  PT Problem List Decreased strength;Decreased activity tolerance;Decreased balance        PT Treatment Interventions DME instruction;Gait training;Functional mobility training;Therapeutic activities;Therapeutic exercise;Balance training;Patient/family education    PT Goals (Current goals can be found in the Care Plan section)  Acute Rehab PT Goals Patient Stated Goal: To improve PT Goal Formulation: With patient Time For Goal Achievement: 04/23/22 Potential to Achieve Goals: Fair    Frequency Min 3X/week     Co-evaluation PT/OT/SLP Co-Evaluation/Treatment: Yes Reason for Co-Treatment: For patient/therapist safety;To address functional/ADL transfers PT goals addressed during session: Mobility/safety with mobility;Balance;Proper use of DME         AM-PAC PT "6 Clicks" Mobility  Outcome Measure Help needed turning from your back to your side while in a flat bed without using bedrails?: Total Help needed moving from lying on your back to sitting on the side of a flat bed without using bedrails?: Total Help needed moving to and from a bed to a chair (including a wheelchair)?: Total Help needed standing up from a chair using your arms (e.g., wheelchair or bedside chair)?: Total Help needed to walk in hospital room?: Total Help needed climbing 3-5 steps with a railing? : Total 6 Click Score: 6    End of Session Equipment Utilized During Treatment: Gait belt Activity Tolerance: Patient limited by fatigue;Other (comment) (possibly orthostatic) Patient left: in chair;with call bell/phone within reach;with chair alarm set Nurse Communication: Mobility status PT Visit Diagnosis: Unsteadiness on feet (R26.81);Muscle weakness (generalized) (M62.81);Other abnormalities of gait and mobility (R26.89)    Time: [id number] PT Time Calculation (min) (ACUTE ONLY): 23 min   Charges:  PT Evaluation $PT Eval Low Complexity: [place], DPT, CLT  Acute Rehabilitation Services Office: [phone] (Secure chat preferred)   Ander Purpura 04/09/2022,  2:48 PM

## 2022-04-09 NOTE — Progress Notes (Signed)
ANTICOAGULATION CONSULT NOTE - Initial Consult  Pharmacy Consult for warfarin/heparin Indication: atrial fibrillation and mechanical MVR  Allergies  Allergen Reactions   Iodinated Contrast Media Rash    Pt stated in the past had broken out in a rash on back and itching.    Esomeprazole Magnesium     REACTION: stomach upset, nausea    Patient Measurements: Height: 5\' 8"  (172.7 cm) Weight: 64.4 kg (142 lb) IBW/kg (Calculated) : 63.9 Heparin Dosing Weight: 64.6 kg  Vital Signs: Temp: 97.2 F (36.2 C) (03/20 1208) Temp Source: Oral (03/20 1208) BP: 109/75 (03/20 1208) Pulse Rate: 69 (03/20 1208)  Labs: Recent Labs    04/07/22 2356 04/08/22 0018 04/08/22 0421 04/09/22 0218 04/09/22 1753  HGB 12.3 13.6 11.6* 10.2*  --   HCT 39.2 40.0 35.7* 33.2*  --   PLT 165  --  158 112*  --   LABPROT 37.5*  --  19.2* 17.0*  --   INR 3.8*  --  1.6* 1.4*  --   HEPARINUNFRC  --   --   --   --  0.33  CREATININE 4.12* 4.80* 3.84* 3.63*  --      Estimated Creatinine Clearance: 11.6 mL/min (A) (by C-G formula based on SCr of 3.63 mg/dL (H)).   Medical History: Past Medical History:  Diagnosis Date   Asthma    years ago   Chronic atrial fibrillation ([place])    Dilated cardiomyopathy ([place])    history of this, now resolved   Dysphagia    Hx   Fracture of tibial plateau 05/21/2015   GERD (gastroesophageal reflux disease)    GI bleed    Hemorrhoid 04/2014   bleeding   History of blood transfusion    Hypertension    Microcytic anemia    Nonsustained ventricular tachycardia (HCC)    Osteoporosis    Protein in urine    RBBB (right bundle branch block)    Rheumatic mitral valve disease     Medications:  Medications Prior to Admission  Medication Sig Dispense Refill Last Dose   Cholecalciferol (VITAMIN D) 2000 units tablet Take 1 tablet (2,000 Units total) by mouth daily. 100 tablet 3 UNKNOWN   dorzolamidel-timolol (COSOPT) 22.3-6.8 MG/ML SOLN ophthalmic solution Place 1 drop into  both eyes in the morning and at bedtime.  4 04/07/2022 at am   furosemide (LASIX) 40 MG tablet Take 1 tablet (40 mg total) by mouth daily. 90 tablet 3 04/07/2022 at am   hydrALAZINE (APRESOLINE) 25 MG tablet TAKE 1/2 TABLET BY MOUTH THREE TIMES DAILY WITH FOOD (Patient taking differently: Take 12.5 mg by mouth with breakfast, with lunch, and with evening meal.) 137 tablet 3 04/06/2022   isosorbide mononitrate (IMDUR) 30 MG 24 hr tablet Take one half tablet by mouth daily (Patient taking differently: Take 15 mg by mouth daily.) 45 tablet 3 04/06/2022   LUMIGAN 0.01 % SOLN Place 1 drop into both eyes at bedtime.  0 04/06/2022 at pm   metoprolol succinate (TOPROL-XL) 100 MG 24 hr tablet Take 0.5-1 tablets (50-100 mg total) by mouth 2 (two) times daily. Please take 1 tablet (100mg ) in the morning and 0.5 tablet (50mg ) in the evening. 90 tablet 1 04/06/2022 at 1800   pantoprazole (PROTONIX) 40 MG tablet TAKE 1 TABLET(40 MG) BY MOUTH DAILY (Patient taking differently: Take 40 mg by mouth daily.) 90 tablet 3 04/06/2022   polyethylene glycol (MIRALAX / GLYCOLAX) 17 g packet Take 17 g by mouth daily as needed for mild  constipation.   Past Week   tamsulosin (FLOMAX) 0.4 MG CAPS capsule TAKE 1 CAPSULE(0.4 MG) BY MOUTH DAILY (Patient taking differently: Take 0.4 mg by mouth daily.) 90 capsule 3 04/07/2022 at am   warfarin (COUMADIN) 10 MG tablet TAKE 1/2 TO 1 TABLET BY MOUTH EVERY DAY AS DIRECTED BY COAGULATION CLINIC (Patient taking differently: Take 5 mg by mouth daily.) 55 tablet 1 04/06/2022 at 1600    Assessment: [name] is a 87 yo female presenting s/p fall resulting in right femur and right clavicular fracture. PMH includes AFib on chronic warfarin (mechanical MVR) with INR goal 2.5-3.5. Warfarin held and reversed for likely ortho procedure. Vitamin K 2.5mg  IV x1 and Kcentra 1500 units IV x1. INR down to 1.6 this morning. Pharmacy consulted to start heparin for surgery plans. Hgb stable at 11.6, plt  wnl.  Pt is s/p THA. Spoke with Noemi Chapel about when to resume heparin and coumadin for MVR. Ok to resume heparin 3/20 AM and coumadin 3/20 PM. INR will likely be resistant due to vit k reversal.   Heparin level came back therapeutic this PM. We will cont with the current rate and check confirm in AM.  Goal of Therapy:  Heparin level 0.3-0.7 units/ml Monitor platelets by anticoagulation protocol: Yes   Plan:  Cont heparin 900 units/hr HL daily Daily INR  Onnie Boer, PharmD, [place], AAHIVP, CPP Infectious Disease Pharmacist 04/09/2022 6:54 PM

## 2022-04-09 NOTE — TOC Initial Note (Signed)
Transition of Care [facility]) - Initial/Assessment Note    Patient Details  Name: [name] MRN: [id number] Date of Birth: 1935/03/02  [facility]) CM/SW Contact:    Tami Chars, LCSW Phone Number: 04/09/2022, 3:48 PM  Clinical Narrative:   CSW met with pt regarding DC recommendation for SNF.  Pt somewhat hard of hearing, does participate in conversation, does state she is agreeable to SNF.  Pt readmit, recently DC with [facility] services.  Lives alone.  Permission given to speak with daughter [name].  Medicare choice document provided.    CSW spoke with daughter [name], who is also in agreement with plan for SNF.  Pt was at [facility] previously and did not have good experience.                  Expected Discharge Plan: Skilled Nursing Facility Barriers to Discharge: Continued Medical Work up, SNF Pending bed offer   Patient Goals and CMS Choice Patient states their goals for this hospitalization and ongoing recovery are:: get back home CMS Medicare.gov Compare Post Acute Care list provided to:: Patient Choice offered to / list presented to : Patient      Expected Discharge Plan and Services In-house Referral: Clinical Social Work   Post Acute Care Choice: [place] Living arrangements for the past 2 months: [place]                                      Prior Living Arrangements/Services Living arrangements for the past 2 months: Single Family Home Lives with:: Self Patient language and need for interpreter reviewed:: Yes Do you feel safe going back to the place where you live?: Yes      Need for Family Participation in Patient Care: Yes (Comment) Care giver support system in place?: Yes (comment) Current home services: Home PT, Home OT ([facility]) Criminal Activity/Legal Involvement Pertinent to Current Situation/Hospitalization: No - Comment as needed  Activities of Daily Living      Permission Sought/Granted Permission  sought to share information with : Family Supports Permission granted to share information with : Yes, Verbal Permission Granted  Share Information with NAME: daughter [name]  Permission granted to share info w AGENCY: SNF        Emotional Assessment Appearance:: Appears stated age Attitude/Demeanor/Rapport: Engaged Affect (typically observed): Appropriate, Pleasant Orientation: : Oriented to Self, Oriented to Place, Oriented to  Time, Oriented to Situation      Admission diagnosis:  Femur fracture, right ([place]) [S72.91XA] AKI (acute kidney injury) ([place]) [N17.9] Closed subcapital fracture of right femur, initial encounter ([place]) [S72.011A] Closed displaced fracture of acromial end of right clavicle, initial encounter [S42.031A] Patient Active Problem List   Diagnosis Date Noted   Femur fracture ([place]) 04/08/2022   Femur fracture, right ([place]) 04/08/2022   Closed nondisplaced fracture of lateral end of right clavicle 04/08/2022   Fall 04/08/2022   Closed subcapital fracture of right femur ([place]) 04/08/2022   Chest pain 04/04/2022   Abnormal EKG 04/04/2022   History of urinary retention 09/18/2021   Chronic combined systolic and diastolic congestive heart failure ([place]) 04/17/2021   Acute-on-chronic kidney injury ([place])    Diverticular disease of colon 04/25/2020   Gastro-esophageal reflux disease without esophagitis 04/25/2020   Personal history of colonic polyps 04/25/2020   Spinal stenosis 04/25/2020   Weakness 04/25/2020   H/O right hemicolectomy 06/16/2018   Abnormal  CT scan, liver 06/16/2018   Chronic atrial fibrillation ([place])    CKD (chronic kidney disease), stage III ([place]) 05/31/2015   Hx of Tibial plateau fracture 2017 05/22/2015   Long term current use of anticoagulant therapy 05/03/2010   VENTRICULAR TACHYCARDIA 02/16/2008   History of mitral valve replacement - St Jude mechanical valve 02/16/2008   Hypertension 03/19/2006   RHINITIS, ALLERGIC 03/19/2006   Osteoarthrosis  involving lower leg 03/19/2006   Osteoporosis 03/19/2006   PCP:  Tami La, MD Pharmacy:   [facility] [place] [place] [address] Phone: [phone] Fax: [phone]  [facility] [address] [place] [address] Phone: [phone] Fax: [phone]     Social Determinants of Health (SDOH) Social History: SDOH Screenings   Food Insecurity: No Food Insecurity (10/07/2018)  Transportation Needs: No Transportation Needs (10/07/2018)  Depression (PHQ2-9): Low Risk  (09/26/2021)  Financial Resource Strain: Low Risk  (10/07/2018)  Physical Activity: Insufficiently Active (10/07/2018)  Social Connections: Moderately Isolated (10/07/2018)  Stress: No Stress Concern Present (10/07/2018)  Tobacco Use: Medium Risk (04/08/2022)   SDOH Interventions:     Readmission Risk Interventions     No data to display

## 2022-04-09 NOTE — Progress Notes (Addendum)
Subjective: Patient reports pain as mild to moderate. Voices concerns about anyone touching or moving her leg. Tolerating diet.  Urinating.   No CP, SOB.  Hasn't mobilized OOB with PT/OT yet.  Objective:   VITALS:   Vitals:   04/09/22 0022 04/09/22 0406 04/09/22 0802 04/09/22 1208  BP: 114/72 101/70 106/74 109/75  Pulse: 80 70 75 69  Resp: 19 15 18 18   Temp: (!) 97.5 F (36.4 C) 98 F (36.7 C) (!) 97.4 F (36.3 C) (!) 97.2 F (36.2 C)  TempSrc: Oral Oral Oral Oral  SpO2: 97% 98% 99%   Weight:      Height:          Latest Ref Rng & Units 04/09/2022    2:18 AM 04/08/2022    4:21 AM 04/08/2022   12:18 AM  CBC  WBC 4.0 - 10.5 K/uL 9.8  12.8    Hemoglobin 12.0 - 15.0 g/dL 10.2  11.6  13.6   Hematocrit 36.0 - 46.0 % 33.2  35.7  40.0   Platelets 150 - 400 K/uL 112  158        Latest Ref Rng & Units 04/09/2022    2:18 AM 04/08/2022    4:21 AM 04/08/2022   12:18 AM  BMP  Glucose 70 - 99 mg/dL 143  146  137   BUN 8 - 23 mg/dL 68  67  59   Creatinine 0.44 - 1.00 mg/dL 3.63  3.84  4.80   Sodium 135 - 145 mmol/L 136  136  139   Potassium 3.5 - 5.1 mmol/L 4.0  4.0  4.0   Chloride 98 - 111 mmol/L 104  102  104   CO2 22 - 32 mmol/L 18  23    Calcium 8.9 - 10.3 mg/dL 8.6  9.2     Intake/Output      03/19 0701 03/20 0700 03/20 0701 03/21 0700   P.O. 60 200   I.V. (mL/kg) 487 (7.6)    IV Piggyback 300    Total Intake(mL/kg) 847 (13.2) 200 (3.1)   Urine (mL/kg/hr) 200 (0.1)    Blood 100    Total Output 300    Net +547 +200        Urine Occurrence 1 x       Physical Exam: General: NAD.  Sitting up in bed, calm, comfortable Resp: No increased wob Cardio: regular rate and rhythm ABD soft Neurologically intact MSK Neurovascularly intact Sensation intact distally Intact pulses distally Dorsiflexion/Plantar flexion intact Incision: dressing C/D/I   Assessment: 1 Day Post-Op  S/P Procedure(s) (LRB): TOTAL HIP ARTHROPLASTY (Right) by Dr. Ernesta Amble. Percell Miller on  04/08/22  Principal Problem:   Femur fracture, right ([place]) Active Problems:   Hypertension   Chronic atrial fibrillation (HCC)   Acute-on-chronic kidney injury ([place])   Femur fracture (HCC)   Closed nondisplaced fracture of lateral end of right clavicle   Fall   Closed subcapital fracture of right femur ([place])   Plan: Unsure why my post-op order for Tramadol was d/c as I'd rather it be used than Oxycodone  Advance diet Up with therapy Incentive Spirometry Elevate and Apply ice  Weightbearing: WBAT RLE Insicional and dressing care: Dressings left intact until follow-up and Reinforce dressings as needed Orthopedic device(s): None Showering: Keep dressing dry VTE prophylaxis:  heparin drip and then Coumadin restarting tonight  , SCDs, ambulation Pain control: limit narcotics Follow - up plan: 2 weeks Contact information:  Edmonia Lynch MD, Aggie Moats  PA-C  Dispo:  TBD based on PT/OT evaluations. Will likely need SNF even though she seems VERY HESITANT to accept that she needs it.    Britt Bottom, PA-C Office [phone] 04/09/2022, 12:12 PM

## 2022-04-09 NOTE — Evaluation (Signed)
Occupational Therapy Evaluation Patient Details Name: [name] MRN: [id number] DOB: 07-12-1935 Today's Date: 04/09/2022   History of Present Illness 87 y.o. female presenting 3/18 with R hip and shoulder pain after a fall. Found to have R clavicle and R hip fractures. Now s/p R total hip arthroplasty 3/19. Recent admission 3/15-16 with RBB. PMH includes: dilated CM, HFrEF, AVR on Coumadin, chronic Afib.   Clinical Impression   Pt recently discharged back home to senior living community. Pt reports that she had been performing her own ADL and functional mobility at home with cane. Upon eval, pt presents with pain, decreased balance, strength, knowledge of precautions, and safety. Pt requiring up to max a for LB ADL and transfers. Pt requiring cues and physical assist for maintaining shoulder precautions and NWB status. Recommending discharge to SNF for continued OT services to optimize safety and independence in ADL and IADL.      Recommendations for follow up therapy are one component of a multi-disciplinary discharge planning process, led by the attending physician.  Recommendations may be updated based on patient status, additional functional criteria and insurance authorization.   Follow Up Recommendations  Skilled nursing-short term rehab (<3 hours/day)     Assistance Recommended at Discharge Frequent or constant Supervision/Assistance  Patient can return home with the following A lot of help with walking and/or transfers;A lot of help with bathing/dressing/bathroom;Assistance with cooking/housework;Direct supervision/assist for medications management;Direct supervision/assist for financial management;Assist for transportation;Help with stairs or ramp for entrance    Functional Status Assessment  Patient has had a recent decline in their functional status and demonstrates the ability to make significant improvements in function in a reasonable and predictable amount of time.   Equipment Recommendations  Other (comment) (defer)    Recommendations for Other Services       Precautions / Restrictions Precautions Precautions: Fall Precaution Comments: HOH Restrictions Weight Bearing Restrictions: Yes RUE Weight Bearing: Non weight bearing RLE Weight Bearing: Weight bearing as tolerated Other Position/Activity Restrictions: can wgtbear through the R elbow      Mobility Bed Mobility Overal bed mobility: Needs Assistance Bed Mobility: Supine to Sit     Supine to sit: Max assist     General bed mobility comments: Assist for moving RLE to EOB as well as truncal elevation, cues throughout. Assist to scoot toward EOB.    Transfers Overall transfer level: Needs assistance Equipment used: 2 person hand held assist Transfers: Sit to/from Stand, Bed to chair/wheelchair/BSC Sit to Stand: Mod assist, +2 physical assistance     Step pivot transfers: Mod assist, Max assist     General transfer comment: mod A for boost up from EOB. Mod-max A for step pivot as pt with dropping BP and decr command following      Balance Overall balance assessment: Needs assistance Sitting-balance support: No upper extremity supported, Feet supported Sitting balance-Leahy Scale: Fair     Standing balance support: Bilateral upper extremity supported, During functional activity, Reliant on assistive device for balance Standing balance-Leahy Scale: Zero Standing balance comment: Up to mod A for static standing balance                           ADL either performed or assessed with clinical judgement   ADL Overall ADL's : Needs assistance/impaired Eating/Feeding: Set up;Sitting   Grooming: Set up;Sitting   Upper Body Bathing: Sitting;Minimal assistance   Lower Body Bathing: Maximal assistance;Sit to/from stand   Upper Body Dressing :  Sitting;Maximal assistance Upper Body Dressing Details (indicate cue type and reason): to don sling Lower Body Dressing:  Maximal assistance;Sit to/from stand   Toilet Transfer: Moderate assistance;Stand-pivot;+2 for physical assistance Toilet Transfer Details (indicate cue type and reason): simulated in room         Functional mobility during ADLs: Moderate assistance;+2 for physical assistance       Vision Baseline Vision/History: 1 Wears glasses Ability to See in Adequate Light: 0 Adequate Patient Visual Report: No change from baseline Vision Assessment?: No apparent visual deficits     Perception Perception Perception Tested?: No   Praxis Praxis Praxis tested?: Not tested    Pertinent Vitals/Pain Pain Assessment Pain Assessment: No/denies pain     Hand Dominance Right   Extremity/Trunk Assessment Upper Extremity Assessment Upper Extremity Assessment: Defer to OT evaluation   Lower Extremity Assessment Lower Extremity Assessment: Generalized weakness   Cervical / Trunk Assessment Cervical / Trunk Assessment: Normal;Kyphotic   Communication Communication Communication: HOH   Cognition Arousal/Alertness: Awake/alert Behavior During Therapy: WFL for tasks assessed/performed Overall Cognitive Status: Difficult to assess                                 General Comments: limited by [facility] at times, pt slow to respond or needing cues repeated. following simple cues with increased time     General Comments  soft BPs at end of session; coming back up in recliner with head reclined and feet up.    Exercises     Shoulder Instructions      Home Living Family/patient expects to be discharged to:: Private residence Living Arrangements: Alone Available Help at Discharge: Family;Available 24 hours/day Type of Home: Apartment Home Access: Level entry     Home Layout: One level     Bathroom Shower/Tub: Occupational psychologist: Handicapped height Bathroom Accessibility: Yes How Accessible: Accessible via walker Home Equipment: Rollator (4 wheels);Cane -  single point;Shower seat   Additional Comments: senior living apt      Prior Functioning/Environment Prior Level of Function : Independent/Modified Independent             Mobility Comments: pt reports use of SPC without LOB ADLs Comments: pt reports not driving, but is otherwise independent with ADLs and IADL        OT Problem List: Decreased strength;Decreased activity tolerance;Impaired balance (sitting and/or standing);Decreased coordination;Decreased safety awareness;Decreased knowledge of use of DME or AE;Cardiopulmonary status limiting activity      OT Treatment/Interventions: Self-care/ADL training;Therapeutic exercise;Energy conservation;DME and/or AE instruction;Therapeutic activities;Patient/family education;Balance training;Cognitive remediation/compensation    OT Goals(Current goals can be found in the care plan section) Acute Rehab OT Goals Patient Stated Goal: go home OT Goal Formulation: With patient Time For Goal Achievement: 04/23/22 Potential to Achieve Goals: Good  OT Frequency: Min 2X/week    Co-evaluation   Reason for Co-Treatment: For patient/therapist safety;To address functional/ADL transfers PT goals addressed during session: Mobility/safety with mobility;Balance;Proper use of DME        AM-PAC OT "6 Clicks" Daily Activity     Outcome Measure Help from another person eating meals?: A Little Help from another person taking care of personal grooming?: A Lot Help from another person toileting, which includes using toliet, bedpan, or urinal?: A Lot Help from another person bathing (including washing, rinsing, drying)?: A Lot Help from another person to put on and taking off regular upper body clothing?: A Lot Help  from another person to put on and taking off regular lower body clothing?: A Lot 6 Click Score: 13   End of Session Equipment Utilized During Treatment: Gait belt;Other (comment) (sling) Nurse Communication: Mobility status;Other  (comment);Weight bearing status (transfers, soft BPs)  Activity Tolerance: Patient tolerated treatment well Patient left: in chair;with call bell/phone within reach;with chair alarm set  OT Visit Diagnosis: Unsteadiness on feet (R26.81);Other abnormalities of gait and mobility (R26.89);Muscle weakness (generalized) (M62.81);Other symptoms and signs involving cognitive function;History of falling (Z91.81)                Time: [id number] OT Time Calculation (min): 28 min Charges:  OT General Charges $OT Visit: 1 Visit OT Evaluation $OT Eval Moderate Complexity: 1 Mod  Elder Cyphers, OTR/L [facility] Acute Rehabilitation Office: [phone]   Magnus Ivan 04/09/2022, 2:41 PM

## 2022-04-09 NOTE — NC FL2 (Signed)
[place] LEVEL OF CARE FORM     IDENTIFICATION  [name] Birthdate: 1935-03-30 Sex: female Admission Date (Current Location): 04/07/2022  [facility] and [place] Number:  Herbalist and Address:  [facility], [place] [address] [place], [place] [address]      Provider Number: [id number]  Attending Physician Name and Address:  Lenoria Chime, MD  Relative Name and Phone Number:  Christoper Allegra Daughter [phone]    Current Level of Care: Hospital Recommended Level of Care: [place] Prior Approval Number:    Date Approved/Denied:   PASRR Number: [id number]  Discharge Plan: SNF    Current Diagnoses: Patient Active Problem List   Diagnosis Date Noted   Femur fracture ([place]) 04/08/2022   Femur fracture, right ([place]) 04/08/2022   Closed nondisplaced fracture of lateral end of right clavicle 04/08/2022   Fall 04/08/2022   Closed subcapital fracture of right femur ([place]) 04/08/2022   Chest pain 04/04/2022   Abnormal EKG 04/04/2022   History of urinary retention 09/18/2021   Chronic combined systolic and diastolic congestive heart failure ([place]) 04/17/2021   Acute-on-chronic kidney injury ([place])    Diverticular disease of colon 04/25/2020   Gastro-esophageal reflux disease without esophagitis 04/25/2020   Personal history of colonic polyps 04/25/2020   Spinal stenosis 04/25/2020   Weakness 04/25/2020   H/O right hemicolectomy 06/16/2018   Abnormal CT scan, liver 06/16/2018   Chronic atrial fibrillation ([place])    CKD (chronic kidney disease), stage III ([place]) 05/31/2015   Hx of Tibial plateau fracture 2017 05/22/2015   Long term current use of anticoagulant therapy 05/03/2010   VENTRICULAR TACHYCARDIA 02/16/2008   History of mitral valve replacement - St Jude mechanical valve 02/16/2008   Hypertension 03/19/2006   RHINITIS, ALLERGIC 03/19/2006   Osteoarthrosis involving lower leg 03/19/2006    Osteoporosis 03/19/2006    Orientation RESPIRATION BLADDER Height & Weight     Self, Time, Situation, Place  Normal Incontinent, External catheter Weight: 142 lb (64.4 kg) Height:  5\' 8"  (172.7 cm)  BEHAVIORAL SYMPTOMS/MOOD NEUROLOGICAL BOWEL NUTRITION STATUS      Continent Diet (see discharge summary)  AMBULATORY STATUS COMMUNICATION OF NEEDS Skin   Limited Assist Verbally Surgical wounds                       Personal Care Assistance Level of Assistance  Bathing, Feeding, Dressing Bathing Assistance: Maximum assistance Feeding assistance: Limited assistance Dressing Assistance: Maximum assistance     Functional Limitations Info  Sight, Hearing, Speech Sight Info: Adequate Hearing Info: Impaired Speech Info: Adequate    SPECIAL CARE FACTORS FREQUENCY  PT (By licensed PT), OT (By licensed OT)     PT Frequency: 5x week OT Frequency: 5x week            Contractures Contractures Info: Not present    Additional Factors Info  Code Status, Allergies Code Status Info: full Allergies Info: Iodinated Contrast Media, Esomeprazole Magnesium           Current Medications (04/09/2022):  This is the current hospital active medication list Current Facility-Administered Medications  Medication Dose Route Frequency Provider Last Rate Last Admin   alum & mag hydroxide-simeth (MAALOX/MYLANTA) 200-200-20 MG/5ML suspension 30 mL  30 mL Oral Q4H PRN Gawne, Meghan M, PA-C       bisacodyl (DULCOLAX) suppository 10 mg  10 mg Rectal Daily PRN Britt Bottom, PA-C  diphenhydrAMINE (BENADRYL) 12.5 MG/5ML elixir 12.5-25 mg  12.5-25 mg Oral Q4H PRN Gawne, Meghan M, PA-C       docusate sodium (COLACE) capsule 100 mg  100 mg Oral BID Aggie Moats M, PA-C   100 mg at 04/09/22 [id number]   heparin ADULT infusion 100 units/mL (25000 units/263mL)  900 Units/hr Intravenous Continuous Pham, Lonna Duval Q, RPH-CPP 9 mL/hr at 04/09/22 1013 900 Units/hr at 04/09/22 1013   magnesium citrate solution 1  Bottle  1 Bottle Oral Once PRN Gawne, Meghan M, PA-C       menthol-cetylpyridinium (CEPACOL) lozenge 3 mg  1 lozenge Oral PRN Madelon Lips, Meghan M, PA-C       Or   phenol (CHLORASEPTIC) mouth spray 1 spray  1 spray Mouth/Throat PRN Gawne, Meghan M, PA-C       methocarbamol (ROBAXIN) tablet 500 mg  500 mg Oral Q6H PRN Gawne, Meghan M, PA-C       Or   methocarbamol (ROBAXIN) 500 mg in dextrose 5 % 50 mL IVPB  500 mg Intravenous Q6H PRN Gawne, Meghan M, PA-C       metoCLOPramide (REGLAN) tablet 5-10 mg  5-10 mg Oral Q8H PRN Gawne, Meghan M, PA-C       Or   metoCLOPramide (REGLAN) injection 5-10 mg  5-10 mg Intravenous Q8H PRN Gawne, Meghan M, PA-C       metoprolol succinate (TOPROL-XL) 24 hr tablet 100 mg  100 mg Oral Daily Aggie Moats M, PA-C   100 mg at 04/09/22 [id number]   metoprolol succinate (TOPROL-XL) 24 hr tablet 50 mg  50 mg Oral QHS Aggie Moats M, PA-C   50 mg at 04/08/22 2110   ondansetron (ZOFRAN) tablet 4 mg  4 mg Oral Q6H PRN Gawne, Meghan M, PA-C       Or   ondansetron [facility]) injection 4 mg  4 mg Intravenous Q6H PRN Gawne, Meghan M, PA-C       Oral care mouth rinse  15 mL Mouth Rinse PRN Gawne, Meghan M, PA-C       oxyCODONE (Oxy IR/ROXICODONE) immediate release tablet 5 mg  5 mg Oral Q4H PRN Gawne, Meghan M, PA-C       pantoprazole (PROTONIX) EC tablet 40 mg  40 mg Oral Daily Aggie Moats M, PA-C   40 mg at 04/09/22 [id number]   polyethylene glycol (MIRALAX / GLYCOLAX) packet 17 g  17 g Oral Daily PRN Gawne, Meghan M, PA-C       tamsulosin [facility]) capsule 0.4 mg  0.4 mg Oral Daily Aggie Moats M, PA-C   0.4 mg at 04/09/22 [id number]   warfarin (COUMADIN) tablet 7.5 mg  7.5 mg Oral ONCE-1600 Pham, Minh Q, RPH-CPP       Warfarin - Pharmacist Dosing Inpatient   Does not apply q1600 Dinah Beers, RPH-CPP         Discharge Medications: Please see discharge summary for a list of discharge medications.  Relevant Imaging Results:  Relevant Lab Results:   Additional Information SSN: [id number]  [name] Fillers [place] Beets, [place]

## 2022-04-10 ENCOUNTER — Inpatient Hospital Stay (HOSPITAL_COMMUNITY): Payer: 59

## 2022-04-10 DIAGNOSIS — S7291XA Unspecified fracture of right femur, initial encounter for closed fracture: Secondary | ICD-10-CM

## 2022-04-10 LAB — CBC
HCT: 32.5 % — ABNORMAL LOW (ref 36.0–46.0)
Hemoglobin: 9.9 g/dL — ABNORMAL LOW (ref 12.0–15.0)
MCH: 28 pg (ref 26.0–34.0)
MCHC: 30.5 g/dL (ref 30.0–36.0)
MCV: 91.8 fL (ref 80.0–100.0)
Platelets: 114 10*3/uL — ABNORMAL LOW (ref 150–400)
RBC: 3.54 MIL/uL — ABNORMAL LOW (ref 3.87–5.11)
RDW: 16.1 % — ABNORMAL HIGH (ref 11.5–15.5)
WBC: 9.3 10*3/uL (ref 4.0–10.5)
nRBC: 0 % (ref 0.0–0.2)

## 2022-04-10 LAB — BASIC METABOLIC PANEL
Anion gap: 8 (ref 5–15)
BUN: 72 mg/dL — ABNORMAL HIGH (ref 8–23)
CO2: 18 mmol/L — ABNORMAL LOW (ref 22–32)
Calcium: 8.3 mg/dL — ABNORMAL LOW (ref 8.9–10.3)
Chloride: 106 mmol/L (ref 98–111)
Creatinine, Ser: 3.19 mg/dL — ABNORMAL HIGH (ref 0.44–1.00)
GFR, Estimated: 14 mL/min — ABNORMAL LOW (ref >60)
Glucose, Bld: 98 mg/dL (ref 70–99)
Potassium: 4 mmol/L (ref 3.5–5.1)
Sodium: 132 mmol/L — ABNORMAL LOW (ref 135–145)

## 2022-04-10 LAB — PROTIME-INR
INR: 1.8 — ABNORMAL HIGH (ref 0.8–1.2)
Prothrombin Time: 20.3 seconds — ABNORMAL HIGH (ref 11.4–15.2)

## 2022-04-10 LAB — HEPARIN LEVEL (UNFRACTIONATED): Heparin Unfractionated: 0.46 IU/mL (ref 0.30–0.70)

## 2022-04-10 MED ORDER — ACETAMINOPHEN 325 MG PO TABS: 650.0000 mg | ORAL_TABLET | Freq: Four times a day (QID) | ORAL | Status: DC | PRN

## 2022-04-10 MED ORDER — WARFARIN SODIUM 7.5 MG PO TABS
7.5000 mg | ORAL_TABLET | Freq: Once | ORAL | Status: AC
  Administered 2022-04-10: 7.5 mg via ORAL
  Filled 2022-04-10: qty 1

## 2022-04-10 MED ORDER — LIDOCAINE 5 % EX PTCH
1.0000 | MEDICATED_PATCH | CUTANEOUS | Status: DC
  Administered 2022-04-10: 1 via TRANSDERMAL
  Filled 2022-04-10: qty 1

## 2022-04-10 MED ORDER — HYDROMORPHONE HCL 1 MG/ML IJ SOLN: 0.5000 mg | INTRAMUSCULAR | Status: DC | PRN

## 2022-04-10 MED ORDER — SENNOSIDES-DOCUSATE SODIUM 8.6-50 MG PO TABS
1.0000 | ORAL_TABLET | Freq: Two times a day (BID) | ORAL | Status: DC
  Administered 2022-04-10 – 2022-04-11 (×2): 1 via ORAL
  Filled 2022-04-10 (×2): qty 1

## 2022-04-10 MED ORDER — POLYETHYLENE GLYCOL 3350 17 G PO PACK
17.0000 g | PACK | Freq: Two times a day (BID) | ORAL | Status: DC
  Administered 2022-04-10 – 2022-04-11 (×2): 17 g via ORAL
  Filled 2022-04-10 (×2): qty 1

## 2022-04-10 NOTE — Progress Notes (Signed)
ANTICOAGULATION CONSULT NOTE - [place] for heparin >> warfarin  Indication: atrial fibrillation and mechanical MVR  Allergies  Allergen Reactions   Iodinated Contrast Media Rash    Pt stated in the past had broken out in a rash on back and itching.    Esomeprazole Magnesium     REACTION: stomach upset, nausea    Patient Measurements: Height: 5\' 8"  (172.7 cm) Weight: 64.4 kg (142 lb) IBW/kg (Calculated) : 63.9 Heparin Dosing Weight: 64.6 kg  Vital Signs: Temp: 97.9 F (36.6 C) (03/21 0836) Temp Source: Oral (03/21 0836) BP: 127/67 (03/21 0836) Pulse Rate: 93 (03/21 0836)  Labs: Recent Labs    04/08/22 0421 04/09/22 0218 04/09/22 1753 04/10/22 0245  HGB 11.6* 10.2*  --  9.9*  HCT 35.7* 33.2*  --  32.5*  PLT 158 112*  --  114*  LABPROT 19.2* 17.0*  --  20.3*  INR 1.6* 1.4*  --  1.8*  HEPARINUNFRC  --   --  0.33 0.46  CREATININE 3.84* 3.63*  --  3.19*     Estimated Creatinine Clearance: 13.2 mL/min (A) (by C-G formula based on SCr of 3.19 mg/dL (H)).   Medical History: Past Medical History:  Diagnosis Date   Asthma    years ago   Chronic atrial fibrillation ([place])    Dilated cardiomyopathy ([place])    history of this, now resolved   Dysphagia    Hx   Fracture of tibial plateau 05/21/2015   GERD (gastroesophageal reflux disease)    GI bleed    Hemorrhoid 04/2014   bleeding   History of blood transfusion    Hypertension    Microcytic anemia    Nonsustained ventricular tachycardia (HCC)    Osteoporosis    Protein in urine    RBBB (right bundle branch block)    Rheumatic mitral valve disease     Medications:  Medications Prior to Admission  Medication Sig Dispense Refill Last Dose   Cholecalciferol (VITAMIN D) 2000 units tablet Take 1 tablet (2,000 Units total) by mouth daily. 100 tablet 3 UNKNOWN   dorzolamidel-timolol (COSOPT) 22.3-6.8 MG/ML SOLN ophthalmic solution Place 1 drop into both eyes in the morning and at bedtime.   4 04/07/2022 at am   furosemide (LASIX) 40 MG tablet Take 1 tablet (40 mg total) by mouth daily. 90 tablet 3 04/07/2022 at am   hydrALAZINE (APRESOLINE) 25 MG tablet TAKE 1/2 TABLET BY MOUTH THREE TIMES DAILY WITH FOOD (Patient taking differently: Take 12.5 mg by mouth with breakfast, with lunch, and with evening meal.) 137 tablet 3 04/06/2022   isosorbide mononitrate (IMDUR) 30 MG 24 hr tablet Take one half tablet by mouth daily (Patient taking differently: Take 15 mg by mouth daily.) 45 tablet 3 04/06/2022   LUMIGAN 0.01 % SOLN Place 1 drop into both eyes at bedtime.  0 04/06/2022 at pm   metoprolol succinate (TOPROL-XL) 100 MG 24 hr tablet Take 0.5-1 tablets (50-100 mg total) by mouth 2 (two) times daily. Please take 1 tablet (100mg ) in the morning and 0.5 tablet (50mg ) in the evening. 90 tablet 1 04/06/2022 at 1800   pantoprazole (PROTONIX) 40 MG tablet TAKE 1 TABLET(40 MG) BY MOUTH DAILY (Patient taking differently: Take 40 mg by mouth daily.) 90 tablet 3 04/06/2022   polyethylene glycol (MIRALAX / GLYCOLAX) 17 g packet Take 17 g by mouth daily as needed for mild constipation.   Past Week   tamsulosin (FLOMAX) 0.4 MG CAPS capsule TAKE 1 CAPSULE(0.4  MG) BY MOUTH DAILY (Patient taking differently: Take 0.4 mg by mouth daily.) 90 capsule 3 04/07/2022 at am   warfarin (COUMADIN) 10 MG tablet TAKE 1/2 TO 1 TABLET BY MOUTH EVERY DAY AS DIRECTED BY COAGULATION CLINIC (Patient taking differently: Take 5 mg by mouth daily.) 55 tablet 1 04/06/2022 at 1600    Assessment: [name] is a 87 yo female presenting s/p fall resulting in right femur and right clavicular fracture. PMH includes AFib and mechnical MVR on chronic warfarin with INR goal 2.5-3.5. Warfarin held and INR reversed for ortho procedure 3/19. Vitamin K 2.5mg  IV x1 and Kcentra 1500 units IV x1.    Heparin and warfarin resumed 3/20.  Heparin remains therapeutic on 900 units/hr.  INR trending up nicely on increased warfarin dose.  Will repeat  warfarin 7.5mg  dose tonight.  Anticipate can resume home warfarin dose (5mg  daily) on 3/22.    Goal of Therapy:  Heparin level 0.3-0.7 units/ml INR goal 2.5-3.5 Monitor platelets by anticoagulation protocol: Yes   Plan:  Continue heparin at 900 units/hr Warfarin 7.5mg  PO x 1 Daily heparin level, INR, and CBC   [name], Pharm.D., BCPS Clinical Pharmacist Clinical phone for 04/10/2022 from 7:30-3:00 is [phone].  **Pharmacist phone directory can be found on [email or web address] listed under [place].  04/10/2022 9:01 AM

## 2022-04-10 NOTE — Progress Notes (Signed)
Patient has had several ectopic beats, and pauses < 2 seconds, Central Telemetry has called to report strips are saved for review.  Notified Colletta Maryland, MD. No new orders, continue cardiac monitoring.

## 2022-04-10 NOTE — Plan of Care (Signed)
  Problem: Health Behavior/Discharge Planning: Goal: Ability to manage health-related needs will improve Outcome: Progressing   Problem: Clinical Measurements: Goal: Will remain free from infection Outcome: Progressing   Problem: Nutrition: Goal: Adequate nutrition will be maintained Outcome: Progressing   Problem: Elimination: Goal: Will not experience complications related to bowel motility Outcome: Progressing Goal: Will not experience complications related to urinary retention Outcome: Progressing   Problem: Pain Managment: Goal: General experience of comfort will improve Outcome: Progressing   Problem: Safety: Goal: Ability to remain free from injury will improve Outcome: Progressing   Problem: Clinical Measurements: Goal: Postoperative complications will be avoided or minimized Outcome: Progressing   Problem: Pain Management: Goal: Pain level will decrease Outcome: Progressing

## 2022-04-10 NOTE — TOC Progression Note (Addendum)
Transition of Care [facility]) - Progression Note    Patient Details  Name: [name] MRN: [id number] Date of Birth: 1935-08-10  [facility]) CM/SW Contact  Joanne Chars, LCSW Phone Number: 04/10/2022, 9:54 AM  Clinical Narrative:   Bed offers presented to pt.  She would like to discuss with her daughter.    1100: CSW spoke with daughter by phone and pt: they would like to accept offer at [facility].  Message from [place] confirming they can accept pt, won't have bed until tomorrow.    Auth request submitted in [place] and approved: [id number], 3 days: 3/22-3/24.     Expected Discharge Plan: [place] Barriers to Discharge: Continued Medical Work up, SNF Pending bed offer  Expected Discharge Plan and Services In-house Referral: Clinical Social Work   Post Acute Care Choice: [place] Living arrangements for the past 2 months: Single Family Home                                       Social Determinants of Health (SDOH) Interventions SDOH Screenings   Food Insecurity: No Food Insecurity (10/07/2018)  Transportation Needs: No Transportation Needs (10/07/2018)  Depression (PHQ2-9): Low Risk  (09/26/2021)  Financial Resource Strain: Low Risk  (10/07/2018)  Physical Activity: Insufficiently Active (10/07/2018)  Social Connections: Moderately Isolated (10/07/2018)  Stress: No Stress Concern Present (10/07/2018)  Tobacco Use: Medium Risk (04/09/2022)    Readmission Risk Interventions     No data to display

## 2022-04-10 NOTE — Hospital Course (Addendum)
[name] is a 87 y.o.female with a history of chronic a-fib, dilated cardiomyopathy, GERD, GI bleed, hemorrhoid, HTN, microcytic anemia, osteoporosis, RBBB, rheumatic mitral valve disease who was admitted to the [facility] Medicine Teaching Service at [facility] for R hip fracture. Her hospital course is detailed below:  Fall R Femoral neck fracture R distal clavicle fracture Fell at home while on the toilet in the setting of deconditioning and possible vasovagal response. XR showed subcapital right femoral neck fracture and orthopedics was consulted. XR also showed R distal non-displaced clavicle fracture, treated with conservative management. Underwent successful R total hip replacement on 3/19. Minimal pain post-op requiring only Tylenol. PT/OT recommended SNF for additional rehab and patient opted to go to [place]. F/u with orthopedics in 2 weeks outpatient.  Acute on chronic kidney disease Cr 4.8 upon admission (baseline ~2.3), likely prerenal in the setting of dehydration with unknown amount of time down. Possible obstructive component given h/o bladder outlet obstruction in 03/2021 requiring Foley. UA showed proteinuria but renal US negative for hydronephrosis. Continued on home Flomax. Cr trended down throughout hospital stay and was 2.38 (baseline ~2.3) upon discharge.  HTN Patient normotensive throughout hospital course on home Metoprolol. Held Hydralazine and Imdur given recent fall and preference to avoid hypotension.  Other chronic conditions were medically managed with home medications and formulary alternatives as necessary (afib)  PCP Follow-up Recommendations:  BP check and restart Hydrlazine and Imdur as appropriate BMP to monitor Cr, elevated above baseline inpatient Limit ROM R shoulder, NWB and in a sling x 4-6 weeks

## 2022-04-10 NOTE — Progress Notes (Signed)
Subjective: Patient reports pain as mild. Hasn't needed much pain medicine at all. Tolerating diet.  Urinating.   No CP, SOB.  Has mobilized OOB with PT/OT some. Agrees to go to SNF.  Objective:   VITALS:   Vitals:   04/10/22 0417 04/10/22 0728 04/10/22 0836 04/10/22 1327  BP: 121/73 128/83 127/67 99/63  Pulse: 90 90 93 82  Resp: 15 17 14 17   Temp: 98 F (36.7 C) 98.3 F (36.8 C) 97.9 F (36.6 C) 98 F (36.7 C)  TempSrc: Oral Oral Oral Oral  SpO2: 99% 100% 99% 100%  Weight:      Height:          Latest Ref Rng & Units 04/10/2022    2:45 AM 04/09/2022    2:18 AM 04/08/2022    4:21 AM  CBC  WBC 4.0 - 10.5 K/uL 9.3  9.8  12.8   Hemoglobin 12.0 - 15.0 g/dL 9.9  10.2  11.6   Hematocrit 36.0 - 46.0 % 32.5  33.2  35.7   Platelets 150 - 400 K/uL 114  112  158       Latest Ref Rng & Units 04/10/2022    2:45 AM 04/09/2022    2:18 AM 04/08/2022    4:21 AM  BMP  Glucose 70 - 99 mg/dL 98  143  146   BUN 8 - 23 mg/dL 72  68  67   Creatinine 0.44 - 1.00 mg/dL 3.19  3.63  3.84   Sodium 135 - 145 mmol/L 132  136  136   Potassium 3.5 - 5.1 mmol/L 4.0  4.0  4.0   Chloride 98 - 111 mmol/L 106  104  102   CO2 22 - 32 mmol/L 18  18  23    Calcium 8.9 - 10.3 mg/dL 8.3  8.6  9.2    Intake/Output      03/20 0701 03/21 0700 03/21 0701 03/22 0700   P.O. 200 120   I.V. (mL/kg) 138.2 (2.1)    IV Piggyback 1000    Total Intake(mL/kg) 1338.2 (20.8) 120 (1.9)   Urine (mL/kg/hr) 250 (0.2)    Blood     Total Output 250    Net +1088.2 +120        Urine Occurrence 1 x        Assessment: 2 Days Post-Op  S/P Procedure(s) (LRB): TOTAL HIP ARTHROPLASTY (Right) by Dr. Ernesta Amble. Percell Miller on 04/08/22  Principal Problem:   Femur fracture, right ([place]) Active Problems:   Hypertension   Chronic atrial fibrillation (HCC)   Acute-on-chronic kidney injury ([place])   Femur fracture (HCC)   Closed nondisplaced fracture of lateral end of right clavicle   Fall   Closed subcapital fracture of  right femur ([place])   Plan: Need to limit ROM of R shoulder due to clavicle fx, should be NWB RUE x 4-6 weeks and in a sling, needs a platform walker rather than a rolling walker to limit weight placed on R shoulder  Advance diet Up with therapy Incentive Spirometry Elevate and Apply ice  Weightbearing: WBAT RLE, NWB RUE, ok to use platform walker Insicional and dressing care: Dressings left intact until follow-up and Reinforce dressings as needed Orthopedic device(s): None Showering: Keep dressing dry VTE prophylaxis:  Coumadin  , SCDs, ambulation Pain control: limit narcotics Follow - up plan: 2 weeks after surgery Contact information:  Edmonia Lynch MD, Aggie Moats PA-C  Dispo:  SNF. [place] for discharge from orthopedic standpoint.  Britt Bottom, PA-C Office [phone] 04/10/2022, 1:58 PM

## 2022-04-10 NOTE — Progress Notes (Signed)
Mobility Specialist Progress Note   04/10/22 1130  Orthostatic Lying   BP- Lying 120/81  Pulse- Lying 87 (+/-)  Orthostatic Sitting  BP- Sitting 117/77  Pulse- Sitting 93 (+/-)  Orthostatic Standing at 0 minutes  BP- Standing at 0 minutes (!) 113/91  Pulse- Standing at 0 minutes 94 (+/-)  Mobility  Activity Stood at bedside;Transferred from bed to chair;Dangled on edge of bed  Level of Assistance Moderate assist, patient does 50-74%  Assistive Device Front wheel walker  Range of Motion/Exercises Active;All extremities  RUE Weight Bearing NWB  RLE Weight Bearing WBAT  Activity Response Tolerated well   Patient received in supine and agreeable to participate. Progressing well with mobility compared to past session with therapy. Completed orthostatic VS without significant decrease in BP or s/sx of dizziness. Did better with initiating movement in RLE, required mod A for bed mobility more so to assist from supine to sit. Needed occasional tactile and verbal cueing throughout session for hand/foot placement and adhere to NWB precautions on RUE. Stood x2 trials with mod A. On first trial completed stand with RW, needing more physical assistance + cueing to stand. Was able to stand for extended time, complete weight shifting and take minimal steps forwards and backwards. Returned to seated position with max A to descend from stand to sitting. On second standing trial, completed forward-facing stand/pivot to recliner chair with mod A. Tolerated well without complaint or incident. Was left in recliner with all needs met, call bell in reach.   [place] Wanell Lorenzi, BS EXP Mobility Specialist Please contact via SecureChat or Rehab office at [phone]

## 2022-04-10 NOTE — Progress Notes (Addendum)
Daily Progress Note Intern Pager: [phone]  [name] Medical record number: [id number] Date of birth: 09-02-1935 Age: 87 y.o. Gender: female  Primary Care Provider: Dickie La, MD Consultants: Orthopedics Code Status: Full code   Pt Overview and Major Events to Date:  3/18: Admitted to [facility]MTS 3/19: R THA   Assessment and Plan: [name] is a 87 y.o. female presenting s/p fall at home resulting in right femur and right clavicular fracture. PMHx includes chronic A-fib, dilated cardiomyopathy, GERD, GI bleed, hemorrhoid, HTN, microcytic anemia, osteoporosis, RBBB, rheumatic mitral valve disease.  * Femur fracture, right (HCC) POD #2 from R THA. Pain well controlled post op and has not required any prn pain medications. PT/OT recommending SNF placement for rehab, patient agreeable. -Orthopedic surgery consulted, appreciate care  -PT/OT following -Fall precautions -Heparin to Warfarin bridge for DVT prophylaxis -Pain control: Tylenol 1000 mg q6h sch, Oxycodone 5 mg q4h prn  Fall Plan for SNF to regain mobility and strength to hopefully return to independent living  Closed nondisplaced fracture of lateral end of right clavicle Patient intermittently using sling for comfort. Encourage mobility and ROM of R shoulder as tolerated.  Acute-on-chronic kidney injury ([place]) Improved to Cr 3.19 this AM (Cr 4.8 upon admission). Baseline ~2.3. UA showed significant protein. Consider renal US to r/o hydronephrosis and obstructive component. Patient on Flomax secondary obstructive process requiring Foley placement in 03/2021. -Obtain renal US  Hypertension Continues to be normotensive, unlikely to need additional home medications upon discharge and can follow up outpatient for BP management. -On Metoprolol 100 mg in the morning and 50 mg in the evening -Hold home: Hydralazine 12.5 mg 3 times daily, Imdur 15 mg daily  Chronic atrial fibrillation (HCC) Rate  controlled. INR 1.8, subtherapeutic but will bridge to Warfarin per Pharmacy -Monitor for therapeutic levels of Warfarin    FEN/GI: Heart healthy PPx: Warfarin Dispo: SNF pending medical clearance  Subjective:  Patient assessed at bedside, states she feels no pain in her hip. States she tried to get up yesterday and felt very weak and "almost passed out". Agreed she needs to go to SNF for acute rehab.  Objective: Temp:  [97.2 F (36.2 C)-98.3 F (36.8 C)] 97.9 F (36.6 C) (03/21 0836) Pulse Rate:  [69-93] 93 (03/21 0836) Resp:  [13-18] 14 (03/21 0836) BP: (93-133)/(48-83) 127/67 (03/21 0836) SpO2:  [96 %-100 %] 99 % (03/21 0836) Physical Exam: General: Sitting up in bed, alert Cardiovascular: Irregularly irregular. Regular rate. Loud systolic murmur Respiratory: CTAB. Normal WOB on RA Abdomen: Soft, non-tender, non-distended Extremities: No peripheral edema. Bandage over R hip clean and dry without surrounding erythema or edema  Laboratory: Most recent CBC Lab Results  Component Value Date   WBC 9.3 04/10/2022   HGB 9.9 (L) 04/10/2022   HCT 32.5 (L) 04/10/2022   MCV 91.8 04/10/2022   PLT 114 (L) 04/10/2022   Most recent BMP    Latest Ref Rng & Units 04/10/2022    2:45 AM  BMP  Glucose 70 - 99 mg/dL 98   BUN 8 - 23 mg/dL 72   Creatinine 0.44 - 1.00 mg/dL 3.19   Sodium 135 - 145 mmol/L 132   Potassium 3.5 - 5.1 mmol/L 4.0   Chloride 98 - 111 mmol/L 106   CO2 22 - 32 mmol/L 18   Calcium 8.9 - 10.3 mg/dL 8.3     Other pertinent labs: INR: 1.8   Colletta Maryland, MD 04/10/2022, 11:34 AM  PGY-1, [place] Intern pager: [phone], text pages welcome Secure chat group [place]

## 2022-04-11 DIAGNOSIS — S42009D Fracture of unspecified part of unspecified clavicle, subsequent encounter for fracture with routine healing: Secondary | ICD-10-CM | POA: Diagnosis not present

## 2022-04-11 DIAGNOSIS — N183 Chronic kidney disease, stage 3 unspecified: Secondary | ICD-10-CM | POA: Diagnosis not present

## 2022-04-11 DIAGNOSIS — I4891 Unspecified atrial fibrillation: Secondary | ICD-10-CM | POA: Diagnosis not present

## 2022-04-11 DIAGNOSIS — I739 Peripheral vascular disease, unspecified: Secondary | ICD-10-CM | POA: Diagnosis not present

## 2022-04-11 DIAGNOSIS — L89626 Pressure-induced deep tissue damage of left heel: Secondary | ICD-10-CM | POA: Diagnosis not present

## 2022-04-11 DIAGNOSIS — S42001D Fracture of unspecified part of right clavicle, subsequent encounter for fracture with routine healing: Secondary | ICD-10-CM | POA: Diagnosis not present

## 2022-04-11 DIAGNOSIS — I482 Chronic atrial fibrillation, unspecified: Secondary | ICD-10-CM | POA: Diagnosis not present

## 2022-04-11 DIAGNOSIS — I5040 Unspecified combined systolic (congestive) and diastolic (congestive) heart failure: Secondary | ICD-10-CM | POA: Diagnosis not present

## 2022-04-11 DIAGNOSIS — S72041D Displaced fracture of base of neck of right femur, subsequent encounter for closed fracture with routine healing: Secondary | ICD-10-CM | POA: Diagnosis not present

## 2022-04-11 DIAGNOSIS — Z9181 History of falling: Secondary | ICD-10-CM | POA: Diagnosis not present

## 2022-04-11 DIAGNOSIS — M810 Age-related osteoporosis without current pathological fracture: Secondary | ICD-10-CM | POA: Diagnosis not present

## 2022-04-11 DIAGNOSIS — S72001D Fracture of unspecified part of neck of right femur, subsequent encounter for closed fracture with routine healing: Secondary | ICD-10-CM | POA: Diagnosis not present

## 2022-04-11 DIAGNOSIS — S7291XA Unspecified fracture of right femur, initial encounter for closed fracture: Secondary | ICD-10-CM | POA: Diagnosis not present

## 2022-04-11 DIAGNOSIS — S7291XD Unspecified fracture of right femur, subsequent encounter for closed fracture with routine healing: Secondary | ICD-10-CM | POA: Diagnosis not present

## 2022-04-11 DIAGNOSIS — S42034D Nondisplaced fracture of lateral end of right clavicle, subsequent encounter for fracture with routine healing: Secondary | ICD-10-CM | POA: Diagnosis not present

## 2022-04-11 DIAGNOSIS — Z8719 Personal history of other diseases of the digestive system: Secondary | ICD-10-CM | POA: Diagnosis not present

## 2022-04-11 DIAGNOSIS — I150 Renovascular hypertension: Secondary | ICD-10-CM | POA: Diagnosis not present

## 2022-04-11 DIAGNOSIS — M25511 Pain in right shoulder: Secondary | ICD-10-CM | POA: Diagnosis not present

## 2022-04-11 DIAGNOSIS — I951 Orthostatic hypotension: Secondary | ICD-10-CM | POA: Diagnosis not present

## 2022-04-11 DIAGNOSIS — N189 Chronic kidney disease, unspecified: Secondary | ICD-10-CM | POA: Diagnosis not present

## 2022-04-11 DIAGNOSIS — Z952 Presence of prosthetic heart valve: Secondary | ICD-10-CM | POA: Diagnosis not present

## 2022-04-11 DIAGNOSIS — Z96641 Presence of right artificial hip joint: Secondary | ICD-10-CM | POA: Diagnosis not present

## 2022-04-11 DIAGNOSIS — Z7901 Long term (current) use of anticoagulants: Secondary | ICD-10-CM | POA: Diagnosis not present

## 2022-04-11 DIAGNOSIS — N179 Acute kidney failure, unspecified: Secondary | ICD-10-CM | POA: Diagnosis not present

## 2022-04-11 DIAGNOSIS — I10 Essential (primary) hypertension: Secondary | ICD-10-CM | POA: Diagnosis not present

## 2022-04-11 LAB — CBC
HCT: 32.7 % — ABNORMAL LOW (ref 36.0–46.0)
Hemoglobin: 10.3 g/dL — ABNORMAL LOW (ref 12.0–15.0)
MCH: 28.5 pg (ref 26.0–34.0)
MCHC: 31.5 g/dL (ref 30.0–36.0)
MCV: 90.3 fL (ref 80.0–100.0)
Platelets: 127 10*3/uL — ABNORMAL LOW (ref 150–400)
RBC: 3.62 MIL/uL — ABNORMAL LOW (ref 3.87–5.11)
RDW: 16.1 % — ABNORMAL HIGH (ref 11.5–15.5)
WBC: 7.1 10*3/uL (ref 4.0–10.5)
nRBC: 0.6 % — ABNORMAL HIGH (ref 0.0–0.2)

## 2022-04-11 LAB — BASIC METABOLIC PANEL
Anion gap: 8 (ref 5–15)
BUN: 63 mg/dL — ABNORMAL HIGH (ref 8–23)
CO2: 21 mmol/L — ABNORMAL LOW (ref 22–32)
Calcium: 8.7 mg/dL — ABNORMAL LOW (ref 8.9–10.3)
Chloride: 106 mmol/L (ref 98–111)
Creatinine, Ser: 2.38 mg/dL — ABNORMAL HIGH (ref 0.44–1.00)
GFR, Estimated: 20 mL/min — ABNORMAL LOW (ref >60)
Glucose, Bld: 94 mg/dL (ref 70–99)
Potassium: 3.9 mmol/L (ref 3.5–5.1)
Sodium: 135 mmol/L (ref 135–145)

## 2022-04-11 LAB — HEPARIN LEVEL (UNFRACTIONATED): Heparin Unfractionated: 0.59 IU/mL (ref 0.30–0.70)

## 2022-04-11 LAB — PROTIME-INR
INR: 2.8 — ABNORMAL HIGH (ref 0.8–1.2)
Prothrombin Time: 28.9 seconds — ABNORMAL HIGH (ref 11.4–15.2)

## 2022-04-11 MED ORDER — OXYCODONE HCL 5 MG PO TABS: 5.0000 mg | ORAL_TABLET | ORAL | 0 refills | Status: AC | PRN

## 2022-04-11 MED ORDER — SENNOSIDES-DOCUSATE SODIUM 8.6-50 MG PO TABS: 1.0000 | ORAL_TABLET | Freq: Two times a day (BID) | ORAL | Status: DC

## 2022-04-11 MED ORDER — WARFARIN SODIUM 5 MG PO TABS: 5.0000 mg | ORAL_TABLET | Freq: Every day | ORAL | Status: DC

## 2022-04-11 MED ORDER — OXYCODONE HCL 5 MG PO TABS: 5.0000 mg | ORAL_TABLET | ORAL | 0 refills | Status: DC | PRN

## 2022-04-11 MED ORDER — ACETAMINOPHEN 325 MG PO TABS
650.0000 mg | ORAL_TABLET | Freq: Four times a day (QID) | ORAL | Status: DC
  Administered 2022-04-11: 650 mg via ORAL
  Filled 2022-04-11: qty 2

## 2022-04-11 NOTE — Discharge Summary (Addendum)
[facility] Discharge Summary  [name] Medical record number: [id number] Date of birth: 11-Aug-1935 Age: 87 y.o. Gender: female Date of Admission: 04/07/2022  Date of Discharge: 04/11/2022 Admitting Physician: Leeanne Rio, MD  Primary Care Provider: Dickie La, MD Consultants: Orthopedics  Indication for Hospitalization: R femur fracture  Discharge Diagnoses/Problem List:  Principal Problem for Admission: R femur fracture Other Problems addressed during stay:  Principal Problem:   Femur fracture, right [facility]) Active Problems:   Hypertension   Acute-on-chronic kidney injury ([place])   Closed nondisplaced fracture of lateral end of right clavicle   Fall   Chronic atrial fibrillation ([place])   Femur fracture ([place])   Closed subcapital fracture of right femur [facility])  Brief Hospital Course:  [name] is a 87 y.o.female with a history of chronic a-fib, dilated cardiomyopathy, GERD, GI bleed, hemorrhoid, HTN, microcytic anemia, osteoporosis, RBBB, rheumatic mitral valve disease who was admitted to the [facility] Medicine Teaching Service at [facility] for R hip fracture. Her hospital course is detailed below:  Fall R Femoral neck fracture R distal clavicle fracture Fell at home while on the toilet in the setting of deconditioning and possible vasovagal response. XR showed subcapital right femoral neck fracture and orthopedics was consulted. XR also showed R distal non-displaced clavicle fracture, treated with conservative management. Underwent successful R total hip replacement on 3/19. Minimal pain post-op requiring only Tylenol. PT/OT recommended SNF for additional rehab and patient opted to go to [place]. F/u with orthopedics in 2 weeks outpatient.  Acute on chronic kidney disease Cr 4.8 upon admission (baseline ~2.3), likely prerenal in the setting of dehydration with unknown amount of time down. Possible obstructive component given  h/o bladder outlet obstruction in 03/2021 requiring Foley. UA showed proteinuria but renal US negative for hydronephrosis. Continued on home Flomax. Cr trended down throughout hospital stay and was 2.38 (baseline ~2.3) upon discharge.  HTN Patient normotensive throughout hospital course on home Metoprolol. Held Hydralazine and Imdur given recent fall and preference to avoid hypotension.  Other chronic conditions were medically managed with home medications and formulary alternatives as necessary (afib)  PCP Follow-up Recommendations:  BP check and restart Hydrlazine and Imdur as appropriate BMP to monitor Cr, elevated above baseline inpatient Limit ROM R shoulder, NWB and in a sling x 4-6 weeks  Disposition: SNF - [facility]  Discharge Condition: Stable  Discharge Exam:  Vitals:   04/11/22 0408 04/11/22 0800  BP: 129/68 (!) 112/55  Pulse: 90 78  Resp: 17 16  Temp:  (!) 97.5 F (36.4 C)  SpO2: 98% 99%    General: Sitting up in bed, alert Cardiovascular: Irregularly irregular. Regular rate. Loud systolic murmur Respiratory: CTAB. Normal WOB on RA Abdomen: Soft, non-tender, non-distended Extremities: No peripheral edema. Bandage over R hip clean and dry without surrounding erythema or edema  Significant Procedures: R THA  Significant Labs and Imaging:  Recent Labs  Lab 04/10/22 0245 04/11/22 0219  WBC 9.3 7.1  HGB 9.9* 10.3*  HCT 32.5* 32.7*  PLT 114* 127*   Recent Labs  Lab 04/10/22 0245 04/11/22 0219  NA 132* 135  K 4.0 3.9  CL 106 106  CO2 18* 21*  GLUCOSE 98 94  BUN 72* 63*  CREATININE 3.19* 2.38*  CALCIUM 8.3* 8.7*    Pertinent Imaging: US RENAL Result Date: 04/10/2022 IMPRESSION: No evidence of renal mass or hydronephrosis.   DG HIP UNILAT WITH PELVIS 1V RIGHT Result Date: 04/08/2022 IMPRESSION: Intraoperative  fluoroscopy for total right hip arthroplasty.   DG Knee 1-2 Views Right Result Date: 04/08/2022 IMPRESSION: Tricompartment degenerative  changes.  No acute bony abnormality.   CT Cervical Spine Wo Contrast Result Date: 04/07/2022 IMPRESSION: 1. No acute fracture or subluxation of the cervical spine. 2. Multilevel degenerative change. 3. Distal right clavicle fracture is partially included in the field of view.  CT HEAD WO CONTRAST (5MM) Result Date: 04/07/2022 IMPRESSION: 1. No acute intracranial abnormality. No skull fracture. 2. Age related atrophy and chronic small vessel ischemic change.  DG Chest Port 1 View Result Date: 04/07/2022 IMPRESSION: 1. Cardiomegaly.  No active cardiopulmonary disease. 2. Redemonstration of the mildly displaced right distal clavicular fracture.  DG Pelvis Portable Result Date: 04/07/2022 IMPRESSION: Subcapital right femoral neck fracture.   DG Shoulder Right Result Date: 04/07/2022 IMPRESSION: 1. Mildly displaced fracture of the distal right clavicle. 2. Moderate acromioclavicular and glenohumeral osteoarthritis.   Results/Tests Pending at Time of Discharge: None  Discharge Medications:  Allergies as of 04/11/2022       Reactions   Iodinated Contrast Media Rash   Pt stated in the past had broken out in a rash on back and itching.    Esomeprazole Magnesium    REACTION: stomach upset, nausea        Medication List     STOP taking these medications    hydrALAZINE 25 MG tablet Commonly known as: APRESOLINE   isosorbide mononitrate 30 MG 24 hr tablet Commonly known as: IMDUR       TAKE these medications    dorzolamidel-timolol 22.3-6.8 MG/ML Soln ophthalmic solution Commonly known as: COSOPT Place 1 drop into both eyes in the morning and at bedtime.   furosemide 40 MG tablet Commonly known as: LASIX Take 1 tablet (40 mg total) by mouth daily.   Lumigan 0.01 % Soln Generic drug: bimatoprost Place 1 drop into both eyes at bedtime.   metoprolol succinate 100 MG 24 hr tablet Commonly known as: TOPROL-XL Take 0.5-1 tablets (50-100 mg total) by mouth 2 (two) times  daily. Please take 1 tablet (100mg ) in the morning and 0.5 tablet (50mg ) in the evening.   oxyCODONE 5 MG immediate release tablet Commonly known as: Oxy IR/ROXICODONE Take 1 tablet (5 mg total) by mouth every 4 (four) hours as needed for up to 5 days for severe pain (pain score 7-10).   pantoprazole 40 MG tablet Commonly known as: PROTONIX TAKE 1 TABLET(40 MG) BY MOUTH DAILY What changed: See the new instructions.   polyethylene glycol 17 g packet Commonly known as: MIRALAX / GLYCOLAX Take 17 g by mouth daily as needed for mild constipation.   senna-docusate 8.6-50 MG tablet Commonly known as: Senokot-S Take 1 tablet by mouth 2 (two) times daily.   tamsulosin 0.4 MG Caps capsule Commonly known as: FLOMAX TAKE 1 CAPSULE(0.4 MG) BY MOUTH DAILY What changed: See the new instructions.   Vitamin D 50 MCG (2000 UT) tablet Take 1 tablet (2,000 Units total) by mouth daily.   warfarin 5 MG tablet Commonly known as: COUMADIN Take as directed. If you are unsure how to take this medication, talk to your nurse or doctor. Original instructions: Take 1 tablet (5 mg total) by mouth daily. What changed:  medication strength how much to take how to take this when to take this additional instructions        Discharge Instructions: Please refer to Patient Instructions section of EMR for full details.  Patient was counseled important signs and symptoms that  should prompt return to medical care, changes in medications, dietary instructions, activity restrictions, and follow up appointments.   Follow-Up Appointments:  Contact information for follow-up providers     Renette Butters, MD. Schedule an appointment as soon as possible for a visit in 2 week(s).   Specialty: Orthopedic Surgery Contact information: [address] [place] [address] [phone]              Contact information for after-discharge care     Destination     [facility] .    Service: Skilled Nursing Contact information: [address] [place] [phone]                     Colletta Maryland, MD 04/11/2022, 11:02 AM PGY-1, [place]

## 2022-04-11 NOTE — TOC Transition Note (Signed)
Transition of Care [facility]) - CM/SW Discharge Note   Patient Details  Name: [name] MRN: [id number] Date of Birth: December 02, 1935  [facility]) CM/SW Contact:  Joanne Chars, LCSW Phone Number: 04/11/2022, 11:09 AM   Clinical Narrative:  Pt discharging to [facility].  RN call report to [phone].  Pt daughter [name] will provide transportation.  Will need pt brought down to main [place] entrance and will need assistance getting pt into the vehicle.    Final next level of care: Skilled Nursing Facility Barriers to Discharge: Barriers Resolved   Patient Goals and CMS Choice CMS Medicare.gov Compare Post Acute Care list provided to:: Patient Choice offered to / list presented to : Patient  Discharge Placement                Patient chooses bed at:  [facility]) Patient to be transferred to facility by: daughter [name] Name of family member notified: daughter [name] Patient and family notified of of transfer: 04/11/22  Discharge Plan and Services Additional resources added to the After Visit Summary for   In-house Referral: Clinical Social Work   Post Acute Care Choice: [place]                               Social Determinants of Health ([place]) Interventions SDOH Screenings   Food Insecurity: No Food Insecurity (10/07/2018)  Transportation Needs: No Transportation Needs (10/07/2018)  Depression (PHQ2-9): Low Risk  (09/26/2021)  Financial Resource Strain: Low Risk  (10/07/2018)  Physical Activity: Insufficiently Active (10/07/2018)  Social Connections: Moderately Isolated (10/07/2018)  Stress: No Stress Concern Present (10/07/2018)  Tobacco Use: Medium Risk (04/09/2022)     Readmission Risk Interventions     No data to display

## 2022-04-11 NOTE — Care Management Important Message (Signed)
Important Message  Patient Details  Name: [name] MRN: [id number] Date of Birth: 1935/05/06   Medicare Important Message Given:  Yes     Hannah Beat 04/11/2022, 1:51 PM

## 2022-04-11 NOTE — TOC Progression Note (Addendum)
Transition of Care [facility]) - Progression Note    Patient Details  Name: [name] MRN: [id number] Date of Birth: [birth year]  [facility]) CM/SW Contact  Joanne Chars, LCSW Phone Number: 04/11/2022, 8:31 AM  Clinical Narrative:   CSW spoke with daughter [name] regarding transportation.  She is able to transport to SNF.  Will set time when DC paperwork is complete.  CSW confirmed with [facility] that they can accept pt today.    Expected Discharge Plan: [place] Barriers to Discharge: Continued Medical Work up, SNF Pending bed offer  Expected Discharge Plan and Services In-house Referral: Clinical Social Work   Post Acute Care Choice: [place] Living arrangements for the past 2 months: Single Family Home                                       Social Determinants of Health (SDOH) Interventions SDOH Screenings   Food Insecurity: No Food Insecurity (10/07/2018)  Transportation Needs: No Transportation Needs (10/07/2018)  Depression (PHQ2-9): Low Risk  (09/26/2021)  Financial Resource Strain: Low Risk  (10/07/2018)  Physical Activity: Insufficiently Active (10/07/2018)  Social Connections: Moderately Isolated (10/07/2018)  Stress: No Stress Concern Present (10/07/2018)  Tobacco Use: Medium Risk (04/09/2022)    Readmission Risk Interventions     No data to display

## 2022-04-11 NOTE — Progress Notes (Signed)
ANTICOAGULATION CONSULT NOTE - [place] for heparin >> warfarin  Indication: atrial fibrillation and mechanical MVR  Allergies  Allergen Reactions   Iodinated Contrast Media Rash    Pt stated in the past had broken out in a rash on back and itching.    Esomeprazole Magnesium     REACTION: stomach upset, nausea    Patient Measurements: Height: 5\' 8"  (172.7 cm) Weight: 64.4 kg (142 lb) IBW/kg (Calculated) : 63.9 Heparin Dosing Weight: 64.6 kg  Vital Signs: BP: 129/68 (03/22 0408) Pulse Rate: 90 (03/22 0408)  Labs: Recent Labs    04/09/22 0218 04/09/22 1753 04/10/22 0245 04/11/22 0219  HGB 10.2*  --  9.9* 10.3*  HCT 33.2*  --  32.5* 32.7*  PLT 112*  --  114* 127*  LABPROT 17.0*  --  20.3* 28.9*  INR 1.4*  --  1.8* 2.8*  HEPARINUNFRC  --  0.33 0.46 0.59  CREATININE 3.63*  --  3.19* 2.38*     Estimated Creatinine Clearance: 17.8 mL/min (A) (by C-G formula based on SCr of 2.38 mg/dL (H)).   Medical History: Past Medical History:  Diagnosis Date   Asthma    years ago   Chronic atrial fibrillation ([place])    Dilated cardiomyopathy ([place])    history of this, now resolved   Dysphagia    Hx   Fracture of tibial plateau 05/21/2015   GERD (gastroesophageal reflux disease)    GI bleed    Hemorrhoid 04/2014   bleeding   History of blood transfusion    Hypertension    Microcytic anemia    Nonsustained ventricular tachycardia (HCC)    Osteoporosis    Protein in urine    RBBB (right bundle branch block)    Rheumatic mitral valve disease     Medications:  Medications Prior to Admission  Medication Sig Dispense Refill Last Dose   Cholecalciferol (VITAMIN D) 2000 units tablet Take 1 tablet (2,000 Units total) by mouth daily. 100 tablet 3 UNKNOWN   dorzolamidel-timolol (COSOPT) 22.3-6.8 MG/ML SOLN ophthalmic solution Place 1 drop into both eyes in the morning and at bedtime.  4 04/07/2022 at am   furosemide (LASIX) 40 MG tablet Take 1 tablet  (40 mg total) by mouth daily. 90 tablet 3 04/07/2022 at am   hydrALAZINE (APRESOLINE) 25 MG tablet TAKE 1/2 TABLET BY MOUTH THREE TIMES DAILY WITH FOOD (Patient taking differently: Take 12.5 mg by mouth with breakfast, with lunch, and with evening meal.) 137 tablet 3 04/06/2022   isosorbide mononitrate (IMDUR) 30 MG 24 hr tablet Take one half tablet by mouth daily (Patient taking differently: Take 15 mg by mouth daily.) 45 tablet 3 04/06/2022   LUMIGAN 0.01 % SOLN Place 1 drop into both eyes at bedtime.  0 04/06/2022 at pm   metoprolol succinate (TOPROL-XL) 100 MG 24 hr tablet Take 0.5-1 tablets (50-100 mg total) by mouth 2 (two) times daily. Please take 1 tablet (100mg ) in the morning and 0.5 tablet (50mg ) in the evening. 90 tablet 1 04/06/2022 at 1800   pantoprazole (PROTONIX) 40 MG tablet TAKE 1 TABLET(40 MG) BY MOUTH DAILY (Patient taking differently: Take 40 mg by mouth daily.) 90 tablet 3 04/06/2022   polyethylene glycol (MIRALAX / GLYCOLAX) 17 g packet Take 17 g by mouth daily as needed for mild constipation.   Past Week   tamsulosin (FLOMAX) 0.4 MG CAPS capsule TAKE 1 CAPSULE(0.4 MG) BY MOUTH DAILY (Patient taking differently: Take 0.4 mg by mouth daily.) 90  capsule 3 04/07/2022 at am   warfarin (COUMADIN) 10 MG tablet TAKE 1/2 TO 1 TABLET BY MOUTH EVERY DAY AS DIRECTED BY COAGULATION CLINIC (Patient taking differently: Take 5 mg by mouth daily.) 55 tablet 1 04/06/2022 at 1600    Assessment: [name] is a 87 yo female presenting s/p fall resulting in right femur and right clavicular fracture. PMH includes AFib and mechnical MVR on chronic warfarin with INR goal 2.5-3.5. Warfarin held and INR reversed for ortho procedure 3/19. Vitamin K 2.5mg  IV x1 and Kcentra 1500 units IV x1.    Heparin and warfarin resumed 3/20.  Heparin remains therapeutic on 900 units/hr.  INR now within goal.  Will discontinue heparin.  Can resume home warfarin dose (5mg  daily) on 3/22.    Goal of Therapy:  INR goal  2.5-3.5 Monitor platelets by anticoagulation protocol: Yes   Plan:  Discontinue heparin and associated labs Resume Warfarin 5mg  PO daily Noted plan for d/c SNF today.   [employer], Pharm.D., BCPS Clinical Pharmacist Clinical phone for 04/11/2022 from 7:30-3:00 is [phone].  **Pharmacist phone directory can be found on [email or web address] listed under [place].  04/11/2022 9:13 AM

## 2022-04-11 NOTE — Progress Notes (Signed)
Attempted to call report to [facility] x3 with no answer. Staff from [place] called for report, I was unavailable at that time. Called back twice with no answer - unable to leave voicemail, did not have the option.

## 2022-04-14 ENCOUNTER — Ambulatory Visit: Payer: Medicare Other | Admitting: Podiatry

## 2022-04-14 DIAGNOSIS — N183 Chronic kidney disease, stage 3 unspecified: Secondary | ICD-10-CM | POA: Diagnosis not present

## 2022-04-14 DIAGNOSIS — I150 Renovascular hypertension: Secondary | ICD-10-CM | POA: Diagnosis not present

## 2022-04-14 DIAGNOSIS — N179 Acute kidney failure, unspecified: Secondary | ICD-10-CM | POA: Diagnosis not present

## 2022-04-14 DIAGNOSIS — Z9181 History of falling: Secondary | ICD-10-CM | POA: Diagnosis not present

## 2022-04-14 DIAGNOSIS — I951 Orthostatic hypotension: Secondary | ICD-10-CM | POA: Diagnosis not present

## 2022-04-14 DIAGNOSIS — I4891 Unspecified atrial fibrillation: Secondary | ICD-10-CM | POA: Diagnosis not present

## 2022-04-14 DIAGNOSIS — Z952 Presence of prosthetic heart valve: Secondary | ICD-10-CM | POA: Diagnosis not present

## 2022-04-14 DIAGNOSIS — S72001D Fracture of unspecified part of neck of right femur, subsequent encounter for closed fracture with routine healing: Secondary | ICD-10-CM | POA: Diagnosis not present

## 2022-04-14 DIAGNOSIS — M810 Age-related osteoporosis without current pathological fracture: Secondary | ICD-10-CM | POA: Diagnosis not present

## 2022-04-14 DIAGNOSIS — Z8719 Personal history of other diseases of the digestive system: Secondary | ICD-10-CM | POA: Diagnosis not present

## 2022-04-14 DIAGNOSIS — S42009D Fracture of unspecified part of unspecified clavicle, subsequent encounter for fracture with routine healing: Secondary | ICD-10-CM | POA: Diagnosis not present

## 2022-04-14 DIAGNOSIS — I5040 Unspecified combined systolic (congestive) and diastolic (congestive) heart failure: Secondary | ICD-10-CM | POA: Diagnosis not present

## 2022-04-16 ENCOUNTER — Ambulatory Visit: Payer: 59 | Admitting: Family Medicine

## 2022-04-16 DIAGNOSIS — M25511 Pain in right shoulder: Secondary | ICD-10-CM | POA: Diagnosis not present

## 2022-04-16 DIAGNOSIS — S72041D Displaced fracture of base of neck of right femur, subsequent encounter for closed fracture with routine healing: Secondary | ICD-10-CM | POA: Diagnosis not present

## 2022-04-28 ENCOUNTER — Ambulatory Visit: Payer: 59

## 2022-04-28 DIAGNOSIS — L89626 Pressure-induced deep tissue damage of left heel: Secondary | ICD-10-CM | POA: Diagnosis not present

## 2022-04-28 DIAGNOSIS — I482 Chronic atrial fibrillation, unspecified: Secondary | ICD-10-CM | POA: Diagnosis not present

## 2022-04-28 DIAGNOSIS — Z7901 Long term (current) use of anticoagulants: Secondary | ICD-10-CM | POA: Diagnosis not present

## 2022-04-28 DIAGNOSIS — S42001D Fracture of unspecified part of right clavicle, subsequent encounter for fracture with routine healing: Secondary | ICD-10-CM | POA: Diagnosis not present

## 2022-04-28 DIAGNOSIS — I739 Peripheral vascular disease, unspecified: Secondary | ICD-10-CM | POA: Diagnosis not present

## 2022-04-28 DIAGNOSIS — Z952 Presence of prosthetic heart valve: Secondary | ICD-10-CM | POA: Diagnosis not present

## 2022-04-28 DIAGNOSIS — S72001D Fracture of unspecified part of neck of right femur, subsequent encounter for closed fracture with routine healing: Secondary | ICD-10-CM | POA: Diagnosis not present

## 2022-04-30 DIAGNOSIS — S72001D Fracture of unspecified part of neck of right femur, subsequent encounter for closed fracture with routine healing: Secondary | ICD-10-CM | POA: Diagnosis not present

## 2022-04-30 DIAGNOSIS — I5040 Unspecified combined systolic (congestive) and diastolic (congestive) heart failure: Secondary | ICD-10-CM | POA: Diagnosis not present

## 2022-04-30 DIAGNOSIS — S42009D Fracture of unspecified part of unspecified clavicle, subsequent encounter for fracture with routine healing: Secondary | ICD-10-CM | POA: Diagnosis not present

## 2022-04-30 DIAGNOSIS — N183 Chronic kidney disease, stage 3 unspecified: Secondary | ICD-10-CM | POA: Diagnosis not present

## 2022-04-30 DIAGNOSIS — I150 Renovascular hypertension: Secondary | ICD-10-CM | POA: Diagnosis not present

## 2022-04-30 DIAGNOSIS — I482 Chronic atrial fibrillation, unspecified: Secondary | ICD-10-CM | POA: Diagnosis not present

## 2022-04-30 DIAGNOSIS — M810 Age-related osteoporosis without current pathological fracture: Secondary | ICD-10-CM | POA: Diagnosis not present

## 2022-05-06 ENCOUNTER — Ambulatory Visit: Payer: 59 | Attending: Cardiovascular Disease | Admitting: *Deleted

## 2022-05-06 DIAGNOSIS — I482 Chronic atrial fibrillation, unspecified: Secondary | ICD-10-CM | POA: Diagnosis not present

## 2022-05-06 DIAGNOSIS — Z952 Presence of prosthetic heart valve: Secondary | ICD-10-CM | POA: Diagnosis not present

## 2022-05-06 DIAGNOSIS — Z5181 Encounter for therapeutic drug level monitoring: Secondary | ICD-10-CM

## 2022-05-06 LAB — POCT INR: INR: 3.1 — AB (ref 2.0–3.0)

## 2022-05-06 NOTE — Patient Instructions (Addendum)
Description   Continue taking Warfarin 1/2 tablet ( )daily. Recheck INR in 1 week.  Be consistent with your 2 serving of greens per week and your 2 ensures a week.  [facility]ou[facility] [phone].  Pt was started on Amiodarone at hospital discharge, loading dosage started 04/03/21.

## 2022-05-10 ENCOUNTER — Telehealth (HOSPITAL_COMMUNITY): Payer: Self-pay | Admitting: Emergency Medicine

## 2022-05-10 ENCOUNTER — Emergency Department (HOSPITAL_COMMUNITY): Payer: 59

## 2022-05-10 ENCOUNTER — Observation Stay (HOSPITAL_COMMUNITY)
Admission: EM | Admit: 2022-05-10 | Discharge: 2022-05-12 | Disposition: A | Discharge status: Home Health | Payer: 59 | Attending: Family Medicine | Admitting: Family Medicine

## 2022-05-10 ENCOUNTER — Other Ambulatory Visit: Payer: Self-pay

## 2022-05-10 DIAGNOSIS — Z743 Need for continuous supervision: Secondary | ICD-10-CM | POA: Diagnosis not present

## 2022-05-10 DIAGNOSIS — Z7901 Long term (current) use of anticoagulants: Secondary | ICD-10-CM | POA: Insufficient documentation

## 2022-05-10 DIAGNOSIS — S20219A Contusion of unspecified front wall of thorax, initial encounter: Secondary | ICD-10-CM | POA: Diagnosis not present

## 2022-05-10 DIAGNOSIS — M810 Age-related osteoporosis without current pathological fracture: Secondary | ICD-10-CM | POA: Insufficient documentation

## 2022-05-10 DIAGNOSIS — I700 Atherosclerosis of aorta: Secondary | ICD-10-CM | POA: Diagnosis not present

## 2022-05-10 DIAGNOSIS — E876 Hypokalemia: Secondary | ICD-10-CM | POA: Insufficient documentation

## 2022-05-10 DIAGNOSIS — Z87891 Personal history of nicotine dependence: Secondary | ICD-10-CM | POA: Diagnosis not present

## 2022-05-10 DIAGNOSIS — I10 Essential (primary) hypertension: Secondary | ICD-10-CM | POA: Diagnosis not present

## 2022-05-10 DIAGNOSIS — S2242XA Multiple fractures of ribs, left side, initial encounter for closed fracture: Secondary | ICD-10-CM | POA: Diagnosis not present

## 2022-05-10 DIAGNOSIS — I482 Chronic atrial fibrillation, unspecified: Secondary | ICD-10-CM | POA: Diagnosis present

## 2022-05-10 DIAGNOSIS — R0789 Other chest pain: Secondary | ICD-10-CM | POA: Diagnosis not present

## 2022-05-10 DIAGNOSIS — S2249XA Multiple fractures of ribs, unspecified side, initial encounter for closed fracture: Secondary | ICD-10-CM

## 2022-05-10 DIAGNOSIS — S022XXA Fracture of nasal bones, initial encounter for closed fracture: Secondary | ICD-10-CM | POA: Diagnosis not present

## 2022-05-10 DIAGNOSIS — S20212A Contusion of left front wall of thorax, initial encounter: Secondary | ICD-10-CM | POA: Diagnosis not present

## 2022-05-10 DIAGNOSIS — I499 Cardiac arrhythmia, unspecified: Secondary | ICD-10-CM | POA: Diagnosis not present

## 2022-05-10 DIAGNOSIS — R079 Chest pain, unspecified: Secondary | ICD-10-CM | POA: Diagnosis not present

## 2022-05-10 DIAGNOSIS — J90 Pleural effusion, not elsewhere classified: Secondary | ICD-10-CM | POA: Diagnosis not present

## 2022-05-10 DIAGNOSIS — R6889 Other general symptoms and signs: Secondary | ICD-10-CM | POA: Diagnosis not present

## 2022-05-10 DIAGNOSIS — J9811 Atelectasis: Secondary | ICD-10-CM | POA: Diagnosis not present

## 2022-05-10 DIAGNOSIS — W19XXXA Unspecified fall, initial encounter: Secondary | ICD-10-CM | POA: Diagnosis present

## 2022-05-10 DIAGNOSIS — J45909 Unspecified asthma, uncomplicated: Secondary | ICD-10-CM | POA: Insufficient documentation

## 2022-05-10 DIAGNOSIS — R9431 Abnormal electrocardiogram [ECG] [EKG]: Secondary | ICD-10-CM | POA: Insufficient documentation

## 2022-05-10 LAB — CBC WITH DIFFERENTIAL/PLATELET
Abs Immature Granulocytes: 0.07 10*3/uL (ref 0.00–0.07)
Basophils Absolute: 0 10*3/uL (ref 0.0–0.1)
Basophils Relative: 0 %
Eosinophils Absolute: 0.1 10*3/uL (ref 0.0–0.5)
Eosinophils Relative: 1 %
HCT: 33.2 % — ABNORMAL LOW (ref 36.0–46.0)
Hemoglobin: 9.8 g/dL — ABNORMAL LOW (ref 12.0–15.0)
Immature Granulocytes: 1 %
Lymphocytes Relative: 21 %
Lymphs Abs: 1.5 10*3/uL (ref 0.7–4.0)
MCH: 27.8 pg (ref 26.0–34.0)
MCHC: 29.5 g/dL — ABNORMAL LOW (ref 30.0–36.0)
MCV: 94.3 fL (ref 80.0–100.0)
Monocytes Absolute: 0.8 10*3/uL (ref 0.1–1.0)
Monocytes Relative: 11 %
Neutro Abs: 4.9 10*3/uL (ref 1.7–7.7)
Neutrophils Relative %: 66 %
Platelets: 168 10*3/uL (ref 150–400)
RBC: 3.52 MIL/uL — ABNORMAL LOW (ref 3.87–5.11)
RDW: 16 % — ABNORMAL HIGH (ref 11.5–15.5)
WBC: 7.3 10*3/uL (ref 4.0–10.5)
nRBC: 0 % (ref 0.0–0.2)

## 2022-05-10 MED ORDER — HYDROCODONE-ACETAMINOPHEN 5-325 MG PO TABS
1.0000 | ORAL_TABLET | Freq: Once | ORAL | Status: AC
  Administered 2022-05-10: 1 via ORAL
  Filled 2022-05-10: qty 1

## 2022-05-10 NOTE — ED Provider Notes (Signed)
EMERGENCY DEPARTMENT AT [facility] Provider Note   CSN: [id number] Arrival date & time: 05/10/22  1953     History  Chief Complaint  Patient presents with   Chest Pain    [name] is a 87 y.o. female.  With a history of A-fib on warfarin, hypertension, asthma, anemia who presents to the ED for evaluation of chest trauma.  She states she was standing up at the refrigerator when she turned and lost her balance.  She fell backwards onto her buttocks.  The walker that she was using came with her and hit her in the sternum.  She reported pain immediately afterwards.  This was witnessed by the daughter who helped the patient up.  Patient denies hitting her head or losing consciousness.  She does take warfarin, but states she has not taken her dose today.  She reports the pain to be localized to the sternal and worse with deep inspiration.  States the pain makes it difficult to breathe but denies shortness of breath.  She was recently discharged from the hospital for a right THA and clavicle fracture.  She has not taken anything for pain prior to arrival.  She denies numbness, weakness, tingling, abdominal pain, nausea, vomiting.  Daughter at the bedside reports patient fell in a similar fashion 1 week ago.  Today the daughter was able to catch the patient, however they both fell backwards.  Patient fell onto the daughter and the walker fell onto the patient.  Patient lives at home alone and does not have a caretaker.  Daughter is concerned about frequency of falls.   Chest Pain      Home Medications Prior to Admission medications   Medication Sig Start Date End Date Taking? Authorizing Provider  Cholecalciferol (VITAMIN D) 2000 units tablet Take 1 tablet (2,000 Units total) by mouth daily. 06/10/17   Nestor Ramp, MD  dorzolamidel-timolol (COSOPT) 22.3-6.8 MG/ML SOLN ophthalmic solution Place 1 drop into both eyes in the morning and at bedtime. 03/02/17   [provider]  furosemide (LASIX) 40 MG tablet Take 1 tablet (40 mg total) by mouth daily. 04/17/21   Nestor Ramp, MD  LUMIGAN 0.01 % SOLN Place 1 drop into both eyes at bedtime. 01/26/17   [provider]  metoprolol succinate (TOPROL-XL) 100 MG 24 hr tablet Take 0.5-1 tablets (50-100 mg total) by mouth 2 (two) times daily. Please take 1 tablet (100mg ) in the morning and 0.5 tablet (50mg ) in the evening. 04/05/22   Elberta Fortis, MD  pantoprazole (PROTONIX) 40 MG tablet TAKE 1 TABLET(40 MG) BY MOUTH DAILY Patient taking differently: Take 40 mg by mouth daily. 10/25/21   Nestor Ramp, MD  polyethylene glycol (MIRALAX / GLYCOLAX) 17 g packet Take 17 g by mouth daily as needed for mild constipation. 07/27/20   Azalee Course, PA  senna-docusate (SENOKOT-S) 8.6-50 MG tablet Take 1 tablet by mouth 2 (two) times daily. 04/11/22   Elberta Fortis, MD  tamsulosin (FLOMAX) 0.4 MG CAPS capsule TAKE 1 CAPSULE(0.4 MG) BY MOUTH DAILY Patient taking differently: Take 0.4 mg by mouth daily. 03/31/22   Nestor Ramp, MD  warfarin (COUMADIN) 5 MG tablet Take 1 tablet (5 mg total) by mouth daily. 04/11/22   Jerre Simon, MD      Allergies    Iodinated contrast media and Esomeprazole magnesium    Review of Systems   Review of Systems  Cardiovascular:  Positive for chest pain.  All  other systems reviewed and are negative.   Physical Exam Updated Vital Signs BP (!) 142/96   Pulse 95   Temp 97.7 F (36.5 C) (Oral)   Resp 17   SpO2 95%  Physical Exam Vitals and nursing note reviewed.  Constitutional:      General: She is not in acute distress.    Appearance: She is well-developed.     Comments: Resting comfortably in bed  HENT:     Head: Normocephalic and atraumatic.  Eyes:     Conjunctiva/sclera: Conjunctivae normal.  Cardiovascular:     Rate and Rhythm: Normal rate and regular rhythm.     Heart sounds: No murmur heard. Pulmonary:     Effort: Pulmonary effort is normal. No respiratory  distress.     Breath sounds: Normal breath sounds. No decreased breath sounds, wheezing or rales.  Chest:     Comments: Significant TTP to the manubrium and superior portion of the body of the sternum.  No obvious deformities or crepitus. Abdominal:     Palpations: Abdomen is soft.     Tenderness: There is no abdominal tenderness.  Musculoskeletal:        General: No swelling.     Cervical back: Neck supple.  Skin:    General: Skin is warm and dry.     Capillary Refill: Capillary refill takes less than 2 seconds.  Neurological:     Mental Status: She is alert.  Psychiatric:        Mood and Affect: Mood normal.     ED Results / Procedures / Treatments   Labs (all labs ordered are listed, but only abnormal results are displayed) Labs Reviewed  BASIC METABOLIC PANEL  CBC WITH DIFFERENTIAL/PLATELET  PROTIME-INR  TROPONIN I (HIGH SENSITIVITY)    EKG EKG Interpretation  Date/Time:  Saturday May 10 2022 22:32:48 EDT Ventricular Rate:  109 PR Interval:  49 QRS Duration: 210 QT Interval:  474 QTC Calculation: 639 R Axis:   232 Text Interpretation: atiral fibrilliaton Right bundle branch block No significant change since last tracing Confirmed by Kommor, Madison ([id number]) on 05/10/2022 10:36:42 PM  Radiology CT Head Wo Contrast  Result Date: 05/10/2022 CLINICAL DATA:  Status post fall. EXAM: CT HEAD WITHOUT CONTRAST TECHNIQUE: Contiguous axial images were obtained from the base of the skull through the vertex without intravenous contrast. RADIATION DOSE REDUCTION: This exam was performed according to the departmental dose-optimization program which includes automated exposure control, adjustment of the mA and/or kV according to patient size and/or use of iterative reconstruction technique. COMPARISON:  April 07, 2022 FINDINGS: Brain: There is mild cerebral atrophy with widening of the extra-axial spaces and ventricular dilatation. There are areas of decreased attenuation within the  white matter tracts of the supratentorial brain, consistent with microvascular disease changes. Vascular: No hyperdense vessel or unexpected calcification. Skull: A chronic nasal bone fracture is again seen. Sinuses/Orbits: No acute finding. Other: None. IMPRESSION: 1. No acute intracranial abnormality. 2. Generalized cerebral atrophy with chronic white matter small vessel ischemic changes. Electronically Signed   By: Aram Candela M.D.   On: 05/10/2022 22:31   CT Chest Wo Contrast  Result Date: 05/10/2022 CLINICAL DATA:  Status post fall. EXAM: CT CHEST WITHOUT CONTRAST TECHNIQUE: Multidetector CT imaging of the chest was performed following the standard protocol without IV contrast. RADIATION DOSE REDUCTION: This exam was performed according to the departmental dose-optimization program which includes automated exposure control, adjustment of the mA and/or kV according to patient size and/or use of  iterative reconstruction technique. COMPARISON:  April 05, 2022 FINDINGS: Cardiovascular: There is marked severity calcification of the thoracic aorta, without evidence of aortic aneurysm. There is moderate severity cardiomegaly with a stable, markedly dilated left atrium and moderate severity coronary artery calcification. An artificial mitral valve is seen. No pericardial effusion. Mediastinum/Nodes: No enlarged mediastinal or axillary lymph nodes. Thyroid gland, trachea, and esophagus demonstrate no significant findings. Lungs/Pleura: Mild atelectasis is seen within the bilateral lung bases, right slightly greater than left. A very small amount of pleural fluid is seen on the left. No pneumothorax is identified. Upper Abdomen: There is a small hiatal hernia. The liver is cirrhotic in appearance. Musculoskeletal: Multiple sternal wires are seen. Acute, posterior seventh and eighth left rib fractures are noted. Multilevel degenerative changes are present throughout the thoracic spine. IMPRESSION: 1. Acute,  posterior seventh and eighth left rib fractures. 2. Very small left pleural effusion. 3. Moderate severity cardiomegaly with findings consistent with history of mitral valve replacement. 4. Small hiatal hernia. 5. Hepatic cirrhosis. 6. Aortic atherosclerosis. Aortic Atherosclerosis (ICD10-I70.0). Electronically Signed   By: Aram Candela M.D.   On: 05/10/2022 22:28   DG Chest 2 View  Result Date: 05/10/2022 CLINICAL DATA:  Sternal chest pain EXAM: CHEST - 2 VIEW COMPARISON:  04/07/2022 FINDINGS: Lungs are clear. No pneumothorax or pleural effusion. Median sternotomy has been performed. Moderate cardiomegaly is stable. Pulmonary vascularity is normal. Osseous structures are age appropriate. Thoracic spine is diffusely osteopenic. No acute bone abnormality identified. IMPRESSION: 1. No active cardiopulmonary disease. 2. Stable cardiomegaly. Electronically Signed   By: Helyn Numbers M.D.   On: 05/10/2022 21:36    Procedures Procedures    Medications Ordered in ED Medications  HYDROcodone-acetaminophen (NORCO/VICODIN) 5-325 MG per tablet 1 tablet (1 tablet Oral Given 05/10/22 2230)    ED Course/ Medical Decision Making/ A&P                             Medical Decision Making Amount and/or Complexity of Data Reviewed Labs: ordered. Radiology: ordered.  Risk Prescription drug management.  This patient presents to the ED for concern of sternal pain, this involves an extensive number of treatment options, and is a complaint that carries with it a high risk of complications and morbidity.  The differential diagnosis includes fracture, strain, sprain, contusion, ACS  Co morbidities that complicate the patient evaluation  A-fib on warfarin, hypertension, asthma, anemia  My initial workup includes ACS rule out, CT head  Additional history obtained from: Nursing notes from this visit. EMS provided portion of the history  I ordered, reviewed and interpreted labs which include: BMP, CBC,  troponin, INR. Pending at the time of shift change.   I ordered imaging studies including CT head, chest x-ray, CT chest I independently visualized and interpreted imaging which showed acute fractures of the posterior left seventh and eighth ribs I agree with the radiologist interpretation  Afebrile, borderline tachycardic but otherwise hemodynamically stable.  87 year old female presenting to the ED for evaluation of chest trauma  And a fall.  These appear to be mechanical falls as she had no prodromal symptoms, however this is her second fall in 7 days.  She lives at home and does not have any caretakers.  Recently discharged after right hip THA.  She has pain with deep inspiration.  Imaging positive for fracture of left posterior seventh and eighth ribs.  Given her multiple recent falls, rib fractures, significant pain with  inspiration and ambulation and her lack of support at home, believe patient would benefit from inpatient monitoring, pain control and incentive spirometry. Will admit when labs result. Care will be handed off to [employer], PA-C pending labs. Please see his note for final disposition and decision making. Plan may change at the discretion of the oncoming provider.   Patient's case discussed with Dr. Posey Rea who agrees with plan to obtain labs and admit for pain control and incentive spirometry.   Note: Portions of this report may have been transcribed using voice recognition software. Every effort was made to ensure accuracy; however, inadvertent computerized transcription errors may still be present.        Final Clinical Impression(s) / ED Diagnoses Final diagnoses:  Contusion of chest wall, unspecified laterality, initial encounter  Closed fracture of multiple ribs of left side, initial encounter    Rx / DC Orders ED Discharge Orders     None         Michelle Piper, Cordelia Poche 05/10/22 2346    Glendora Score, MD 05/11/22 [phone]

## 2022-05-10 NOTE — ED Notes (Signed)
Daughter Cy Blamer [phone] would like an update asap

## 2022-05-10 NOTE — ED Notes (Signed)
Tried to ambulate PT . PT Attempted to get out of bed on left side , PT began to cry and couldn't get legs completely over on the side of the bed the patient stated her chest was in pain and couldn't rise up  . PT would like attempt to ambulate again on right side in about 10 mins  .

## 2022-05-10 NOTE — ED Provider Notes (Signed)
Patient signed out to me at shift change.  Patient here after a fall.  She is anticoagulated on coumadin, but denies hitting her head.  She complains of chest pain.  It appears that she she was not made a level 2 trauma.    At time of signout we are waiting on labs for admission.  CT chest already completed shows rib fractures and small effusion.  This was a non-contrasted CT study and was discussed with Dr. Posey Rea, who saw the patient and doesn't feel that contrasted study is needed.  Will admit after labs.  Case discussed with [facility] team, who is appreciated for admitting.  Will need pain control, pulmonary toilet, and likely SW/CM for potential SNF due to recurrent falls.   Roxy Horseman, PA-C 05/11/22 2130    Marily Memos, MD 05/11/22 [phone]

## 2022-05-10 NOTE — ED Triage Notes (Signed)
Patient arrived with EMS from home , her wheelchair fell on her chest this evening when she lost her balance and almost fell , family assisted her after the incident . No LOC/denies head injury , she takes Coumadin for AFIB . Patient endorses mild central chest pain .

## 2022-05-11 ENCOUNTER — Encounter (HOSPITAL_COMMUNITY): Payer: Self-pay

## 2022-05-11 DIAGNOSIS — S2242XA Multiple fractures of ribs, left side, initial encounter for closed fracture: Secondary | ICD-10-CM | POA: Diagnosis not present

## 2022-05-11 DIAGNOSIS — E876 Hypokalemia: Secondary | ICD-10-CM | POA: Insufficient documentation

## 2022-05-11 DIAGNOSIS — S2249XA Multiple fractures of ribs, unspecified side, initial encounter for closed fracture: Secondary | ICD-10-CM

## 2022-05-11 DIAGNOSIS — R9431 Abnormal electrocardiogram [ECG] [EKG]: Secondary | ICD-10-CM | POA: Insufficient documentation

## 2022-05-11 LAB — BASIC METABOLIC PANEL
Anion gap: 10 (ref 5–15)
Anion gap: 11 (ref 5–15)
BUN: 35 mg/dL — ABNORMAL HIGH (ref 8–23)
BUN: 35 mg/dL — ABNORMAL HIGH (ref 8–23)
CO2: 21 mmol/L — ABNORMAL LOW (ref 22–32)
CO2: 22 mmol/L (ref 22–32)
Calcium: 8.8 mg/dL — ABNORMAL LOW (ref 8.9–10.3)
Calcium: 8.8 mg/dL — ABNORMAL LOW (ref 8.9–10.3)
Chloride: 106 mmol/L (ref 98–111)
Chloride: 106 mmol/L (ref 98–111)
Creatinine, Ser: 1.89 mg/dL — ABNORMAL HIGH (ref 0.44–1.00)
Creatinine, Ser: 2.04 mg/dL — ABNORMAL HIGH (ref 0.44–1.00)
GFR, Estimated: 24 mL/min — ABNORMAL LOW (ref >60)
GFR, Estimated: 26 mL/min — ABNORMAL LOW (ref >60)
Glucose, Bld: 107 mg/dL — ABNORMAL HIGH (ref 70–99)
Glucose, Bld: 122 mg/dL — ABNORMAL HIGH (ref 70–99)
Potassium: 3 mmol/L — ABNORMAL LOW (ref 3.5–5.1)
Potassium: 3.8 mmol/L (ref 3.5–5.1)
Sodium: 138 mmol/L (ref 135–145)
Sodium: 138 mmol/L (ref 135–145)

## 2022-05-11 LAB — TROPONIN I (HIGH SENSITIVITY)
Troponin I (High Sensitivity): 13 ng/L (ref <18)
Troponin I (High Sensitivity): 17 ng/L (ref <18)

## 2022-05-11 LAB — CBC
HCT: 33.2 % — ABNORMAL LOW (ref 36.0–46.0)
Hemoglobin: 10 g/dL — ABNORMAL LOW (ref 12.0–15.0)
MCH: 28.1 pg (ref 26.0–34.0)
MCHC: 30.1 g/dL (ref 30.0–36.0)
MCV: 93.3 fL (ref 80.0–100.0)
Platelets: 164 10*3/uL (ref 150–400)
RBC: 3.56 MIL/uL — ABNORMAL LOW (ref 3.87–5.11)
RDW: 15.9 % — ABNORMAL HIGH (ref 11.5–15.5)
WBC: 7.7 10*3/uL (ref 4.0–10.5)
nRBC: 0 % (ref 0.0–0.2)

## 2022-05-11 LAB — MAGNESIUM: Magnesium: 2 mg/dL (ref 1.7–2.4)

## 2022-05-11 LAB — PROTIME-INR
INR: 3.3 — ABNORMAL HIGH (ref 0.8–1.2)
Prothrombin Time: 33.2 seconds — ABNORMAL HIGH (ref 11.4–15.2)

## 2022-05-11 LAB — VITAMIN D 25 HYDROXY (VIT D DEFICIENCY, FRACTURES): Vit D, 25-Hydroxy: 38.24 ng/mL (ref 30–100)

## 2022-05-11 MED ORDER — WARFARIN - PHARMACIST DOSING INPATIENT: Freq: Every day | Status: DC

## 2022-05-11 MED ORDER — ACETAMINOPHEN 500 MG PO TABS
1000.0000 mg | ORAL_TABLET | Freq: Four times a day (QID) | ORAL | Status: DC
  Administered 2022-05-11 – 2022-05-12 (×3): 1000 mg via ORAL
  Filled 2022-05-11 (×3): qty 2

## 2022-05-11 MED ORDER — WARFARIN SODIUM 5 MG PO TABS
5.0000 mg | ORAL_TABLET | Freq: Every day | ORAL | Status: DC
  Administered 2022-05-11: 5 mg via ORAL
  Filled 2022-05-11: qty 1

## 2022-05-11 MED ORDER — ACETAMINOPHEN 500 MG PO TABS
500.0000 mg | ORAL_TABLET | Freq: Four times a day (QID) | ORAL | Status: DC
  Administered 2022-05-11 (×3): 500 mg via ORAL
  Filled 2022-05-11 (×3): qty 1

## 2022-05-11 MED ORDER — OXYCODONE HCL 5 MG PO TABS
5.0000 mg | ORAL_TABLET | Freq: Four times a day (QID) | ORAL | Status: DC | PRN
  Administered 2022-05-11 (×3): 5 mg via ORAL
  Filled 2022-05-11 (×4): qty 1

## 2022-05-11 MED ORDER — SENNOSIDES-DOCUSATE SODIUM 8.6-50 MG PO TABS
1.0000 | ORAL_TABLET | Freq: Two times a day (BID) | ORAL | Status: DC
  Administered 2022-05-11 – 2022-05-12 (×4): 1 via ORAL
  Filled 2022-05-11 (×4): qty 1

## 2022-05-11 MED ORDER — WARFARIN - PHYSICIAN DOSING INPATIENT: Freq: Every day | Status: DC

## 2022-05-11 MED ORDER — POTASSIUM CHLORIDE CRYS ER 20 MEQ PO TBCR
40.0000 meq | EXTENDED_RELEASE_TABLET | Freq: Once | ORAL | Status: DC
  Filled 2022-05-11: qty 2

## 2022-05-11 MED ORDER — DORZOLAMIDE HCL-TIMOLOL MAL PF 22.3-6.8 MG/ML OP SOLN: 1.0000 [drp] | Freq: Two times a day (BID) | OPHTHALMIC | Status: DC

## 2022-05-11 MED ORDER — METOPROLOL SUCCINATE ER 50 MG PO TB24
100.0000 mg | ORAL_TABLET | Freq: Every day | ORAL | Status: DC
  Administered 2022-05-11 – 2022-05-12 (×2): 100 mg via ORAL
  Filled 2022-05-11 (×2): qty 2

## 2022-05-11 MED ORDER — DORZOLAMIDE HCL-TIMOLOL MAL 2-0.5 % OP SOLN
1.0000 [drp] | Freq: Two times a day (BID) | OPHTHALMIC | Status: DC
  Administered 2022-05-11 – 2022-05-12 (×3): 1 [drp] via OPHTHALMIC
  Filled 2022-05-11: qty 10

## 2022-05-11 MED ORDER — METOPROLOL SUCCINATE ER 50 MG PO TB24
50.0000 mg | ORAL_TABLET | Freq: Every day | ORAL | Status: DC
  Administered 2022-05-11 (×2): 50 mg via ORAL
  Filled 2022-05-11 (×2): qty 1

## 2022-05-11 MED ORDER — POTASSIUM CHLORIDE CRYS ER 20 MEQ PO TBCR
40.0000 meq | EXTENDED_RELEASE_TABLET | Freq: Once | ORAL | Status: AC
  Administered 2022-05-11: 40 meq via ORAL
  Filled 2022-05-11: qty 2

## 2022-05-11 MED ORDER — LATANOPROST 0.005 % OP SOLN
1.0000 [drp] | Freq: Every day | OPHTHALMIC | Status: DC
  Administered 2022-05-11: 1 [drp] via OPHTHALMIC
  Filled 2022-05-11: qty 2.5

## 2022-05-11 MED ORDER — POLYETHYLENE GLYCOL 3350 17 G PO PACK: 17.0000 g | PACK | Freq: Every day | ORAL | Status: DC | PRN

## 2022-05-11 MED ORDER — TAMSULOSIN HCL 0.4 MG PO CAPS
0.4000 mg | ORAL_CAPSULE | Freq: Every day | ORAL | Status: DC
  Administered 2022-05-11 – 2022-05-12 (×2): 0.4 mg via ORAL
  Filled 2022-05-11 (×2): qty 1

## 2022-05-11 MED ORDER — PANTOPRAZOLE SODIUM 40 MG PO TBEC
40.0000 mg | DELAYED_RELEASE_TABLET | Freq: Every day | ORAL | Status: DC
  Administered 2022-05-11 – 2022-05-12 (×2): 40 mg via ORAL
  Filled 2022-05-11 (×2): qty 1

## 2022-05-11 MED ORDER — POTASSIUM CHLORIDE 20 MEQ PO PACK
20.0000 meq | PACK | Freq: Once | ORAL | Status: AC
  Administered 2022-05-11: 20 meq via ORAL
  Filled 2022-05-11: qty 1

## 2022-05-11 MED ORDER — FUROSEMIDE 40 MG PO TABS
40.0000 mg | ORAL_TABLET | Freq: Every day | ORAL | Status: DC
  Administered 2022-05-11 – 2022-05-12 (×2): 40 mg via ORAL
  Filled 2022-05-11 (×2): qty 1

## 2022-05-11 MED ORDER — VITAMIN D 25 MCG (1000 UNIT) PO TABS
2000.0000 [IU] | ORAL_TABLET | Freq: Every day | ORAL | Status: DC
  Administered 2022-05-11 – 2022-05-12 (×2): 2000 [IU] via ORAL
  Filled 2022-05-11 (×2): qty 2

## 2022-05-11 NOTE — Assessment & Plan Note (Signed)
Patient EKG on admission showing A. Fib, rate controlled, w/ Qtc 639. Will avoid QT prolonging agents.  -CTM

## 2022-05-11 NOTE — Evaluation (Signed)
Physical Therapy Evaluation Patient Details Name: [name] MRN: [id number] DOB: August 29, 1935 Today's Date: 05/11/2022  History of Present Illness  Pt is a 87 y.o. F who presents 05/10/2022 with witnessed mechanical fall and 7th and 8th rib fractures on CT chest. 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  Clinical Impression  Pt admitted with above. Pt reports sharp chest pain with movement and inhalation; RN notified for pain medication. Pt requiring min assist for transfers and ambulating 100 ft with a R platform RW at a min guard assist level. Orthostatic vitals positive from supine to sitting, but negative sitting to standing (see below). Suspect pt will progress well when pain is under better control. Pt reports she had been using a Rollator at home; however, per ortho note from last admission, pt is weightbearing through R elbow only for 4-6 weeks. Therefore, recommend R platform RW for mobility. Attempted to call pt daughter with no response. Will continue to progress as tolerated.  Orthostatic Vitals: Supine: 155/100 (113) Sitting: 129/95 (104) Standing: 139/98 (113)     Recommendations for follow up therapy are one component of a multi-disciplinary discharge planning process, led by the attending physician.  Recommendations may be updated based on patient status, additional functional criteria and insurance authorization.  Follow Up Recommendations       Assistance Recommended at Discharge Frequent or constant Supervision/Assistance  Patient can return home with the following  A little help with walking and/or transfers;A little help with bathing/dressing/bathroom;Assistance with cooking/housework;Assist for transportation;Help with stairs or ramp for entrance    Equipment Recommendations Other (comment) (R  platform RW)  Recommendations for Other Services       Functional Status Assessment Patient has had a recent decline in their functional status and demonstrates the ability to make significant improvements in function in a reasonable and predictable amount of time.     Precautions / Restrictions Precautions Precautions: Fall Precaution Comments: L rib fxs Restrictions Weight Bearing Restrictions: Yes RUE Weight Bearing: Weight bear through elbow only RLE Weight Bearing: Weight bearing as tolerated      Mobility  Bed Mobility Overal bed mobility: Needs Assistance Bed Mobility: Supine to Sit     Supine to sit: Min assist     General bed mobility comments: Slow to exit, cues for use of bed rail with LUE, minA to scoot L hip out to edge of bed    Transfers Overall transfer level: Needs assistance Equipment used: Right platform walker Transfers: Sit to/from Stand Sit to Stand: Min assist           General transfer comment: MinA to rise from edge of bed and BSC. Difficulty with descending to chair    Ambulation/Gait Ambulation/Gait assistance: Min guard Gait Distance (Feet): 100 Feet Assistive device: Right platform walker Gait Pattern/deviations: Step-through pattern, Decreased stride length, Decreased step length - left, Decreased stance time - right Gait velocity: decreased     General Gait Details: Overall slow and steady pace, min guard for safety  Stairs            Wheelchair Mobility    Modified Rankin (Stroke Patients Only)       Balance Overall balance assessment: Needs assistance Sitting-balance support: Feet supported Sitting balance-Leahy Scale: Fair     Standing balance support: Bilateral upper extremity supported Standing balance-Leahy Scale: Poor  Pertinent Vitals/Pain Pain Assessment Pain Assessment: Faces Faces Pain Scale: Hurts whole lot Pain Location: chest with inhalation and  movement Pain Descriptors / Indicators: Grimacing, Guarding, Sharp Pain Intervention(s): Limited activity within patient's tolerance, Monitored during session, Patient requesting pain meds-RN notified    Home Living Family/patient expects to be discharged to:: Private residence Living Arrangements: Alone Available Help at Discharge: Family;Available 24 hours/day Type of Home: Apartment Home Access: Level entry       Home Layout: One level Home Equipment: Rollator (4 wheels);Cane - single point;Shower seat;BSC/3in1 Additional Comments: senior living apt    Prior Function Prior Level of Function : Needs assist             Mobility Comments: had returned to using Rollator ADLs Comments: granddaughter was assisting with IADL's     Hand Dominance   Dominant Hand: Right    Extremity/Trunk Assessment   Upper Extremity Assessment Upper Extremity Assessment: Defer to OT evaluation    Lower Extremity Assessment Lower Extremity Assessment: Generalized weakness    Cervical / Trunk Assessment Cervical / Trunk Assessment: Normal  Communication   Communication: HOH  Cognition Arousal/Alertness: Awake/alert Behavior During Therapy: WFL for tasks assessed/performed Overall Cognitive Status: Difficult to assess                                          General Comments      Exercises     Assessment/Plan    PT Assessment Patient needs continued PT services  PT Problem List Decreased strength;Decreased activity tolerance;Decreased balance;Decreased mobility;Pain       PT Treatment Interventions DME instruction;Gait training;Functional mobility training;Therapeutic activities;Therapeutic exercise;Balance training;Patient/family education    PT Goals (Current goals can be found in the Care Plan section)  Acute Rehab PT Goals Patient Stated Goal: less pain PT Goal Formulation: With patient Time For Goal Achievement: 05/25/22 Potential to Achieve Goals:  Good    Frequency Min 3X/week     Co-evaluation               AM-PAC PT "6 Clicks" Mobility  Outcome Measure Help needed turning from your back to your side while in a flat bed without using bedrails?: A Little Help needed moving from lying on your back to sitting on the side of a flat bed without using bedrails?: A Little Help needed moving to and from a bed to a chair (including a wheelchair)?: A Little Help needed standing up from a chair using your arms (e.g., wheelchair or bedside chair)?: A Little Help needed to walk in hospital room?: A Little Help needed climbing 3-5 steps with a railing? : A Lot 6 Click Score: 17    End of Session Equipment Utilized During Treatment: Gait belt Activity Tolerance: Patient tolerated treatment well Patient left: in chair;with call bell/phone within reach;with chair alarm set Nurse Communication: Mobility status;Patient requests pain meds PT Visit Diagnosis: Unsteadiness on feet (R26.81);History of falling (Z91.81);Difficulty in walking, not elsewhere classified (R26.2);Pain Pain - part of body:  (chest)    Time: 1610-9604 PT Time Calculation (min) (ACUTE ONLY): 47 min   Charges:   PT Evaluation $PT Eval Moderate Complexity: 1 Mod PT Treatments $Therapeutic Activity: 23-37 mins        Lillia Pauls, PT, DPT Acute Rehabilitation Services Office [phone]   Norval Morton 05/11/2022, 10:45 AM

## 2022-05-11 NOTE — Assessment & Plan Note (Addendum)
Currently rate controlled on 100 mg Toprol AM and 50 mg PM, w/ warfarin for anticoagulation. INR therapeutic at 3.3 - Continue Metoprolol and Warfarin - Warfarin per pharmacy - Consider reducing dose metoprolol for concern of hypotension

## 2022-05-11 NOTE — H&P (Addendum)
Hospital Admission History and Physical Service Pager: [phone]  [name] Medical record number: [id number] Date of Birth: [birth year] Age: 87 y.o. Gender: female  Primary Care Provider: Nestor Ramp, MD Consultants: None Code Status: Full Code  Preferred Emergency Contact:  Contact Information     Name Relation Home Work Blanch[place] Daughter [phone]     Elyse Hsu   [phone]        Chief Complaint: Rib Fractures  Assessment and Plan: NEKA BISE is a 87 y.o. female presenting with rib fractures s/p witnessed mechanical fall. Differential for this patient's presentation of this includes syncope (though less likely as she had no prodromal symptoms), seizure (unlikely as she never lost consciousness and had no post ictal time), orthostatic hypotension (though less likely as she was already standing when fall happened).   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 Will admit for pain control and pulmonary toilet. -Admit to FPTS, attending Dr. Lum Babe -Vitals per unit -Up with assistance -Fall precautions -Tylenol 500 mg q6h -Oxycodone 5 mg prn q6h -Pulmonary toilet -Incentive Spirometer -PT/OT eval/treat -Orthostatic Vitals -AM BMP & CBC -Vitamin D lvl  Fall Patient presents for witnessed mechanical fall at home. Patient recently admitted for fall at home 3/18 that resulted in femur and clavicular fracture. Patient believes leg has been a little week since fracture and may have contributed to the fall. Patient note's she was d/c from SNF, after hospitalization, on 4/11. Patient denies any prodromal signs, hear racing, or seizure like activity. Fall appears to be  mechanical. Patient did not hit head, and Head CT was negative. -Orthostatic Vitals  Chronic atrial fibrillation Currently rate controlled on 100 mg Toprol AM and 50 mg PM, w/ warfarin for anticoagulation. INR therapeutic at 3.3 - Continue Metoprolol and Warfarin - Warfarin per pharmacy - Consider reducing dose metoprolol for concern of hypotension   Prolonged QT interval Patient EKG on admission showing A. Fib, rate controlled, w/ Qtc 639. Will avoid QT prolonging agents.  -CTM  Hypokalemia K 3.0 on admission. Was repleted. Will continue to monitor -AM BMP   Chronic Stable Conditions GERD -  Protonix 40 mg CHF - Euvolemic/does not appear to be in exacerbation. Continue lasix 40 mg  FEN/GI: Heart Healthy VTE Prophylaxis: Warfarin  Disposition: Med Surg  History of Present Illness:  [name] is a 87 y.o. female presenting with rib fractures after witnessed mechanical fall.   Was opening the refrigerator and got off balanced and fell backwards onto her granddaughter. She did not hit her head or loose consciousness. She notes her right leg is a little weak after surgery and maybe this played a role. Denies any prodromal symptoms, or post ictal state. Patient notes this is her 2nd fall this year. She was recently admitted for a mechanical fall where she broker her femur and clavicle. She received surgery and was d/c to SNF. She notes that, she was discharged from snf on the 11th and was staying with daughter, until this Wednesday when she went back home.   In the ED, patient had CT head performed that was negative and CT chest performed that showed 7th and 8th rib posterior fractures. She was given vicodin for pain controlled and FPTS  was called for admission for pain control and pulmonary toilet.   Review Of Systems: Per HPI with the following additions:   Pertinent Past Medical History: Chronic A-fib, dilated cardiomyopathy, GERD, Hx of GI bleed, hemorrhoid, hypertension,  microcytic anemia, osteoporosis, right bundle branch block, hx of rheumatic mitral valve disease w/ artifical valve   Remainder reviewed in history tab.   Pertinent Past Surgical History: Cholecystectomy 2005 Hemicolectomy 2008 Mitral valve replacement 1987 Right and left heart cath 2023   Remainder reviewed in history tab.  Pertinent Social History: Tobacco use:  Former, 1.5 PPD quit 1987  Alcohol use: Denies Other Substance use: denies Lives with herself  Pertinent Family History: Mother: Hypertension, stroke Father: Prostate cancer Sister: Bladder cancer  Remainder reviewed in history tab.   Important Outpatient Medications: Lasix 40 mg daily Toprol 100 mg daily Protonix 40 mg daily MiraLAX Flomax 0.4 mg daily Senna-docusate 8.6-5.0 Lumigan 0.01% qhs Dorzolamidel-timolol 22.3-6.8 BID Warfarin 5-10 mg daily as directed by coagulation clinic Remainder reviewed in medication history.   Objective: BP (!) 149/100 (BP Location: Left Arm)   Pulse 99   Temp 98 F (36.7 C) (Oral)   Resp 15   Ht  (1.727 m)   Wt 66.6 kg   SpO2 100%   BMI 22.32 kg/m  Exam: General: Well appearing, NAD, elderly, frail, conversant ENTM: MMM Cardiovascular: S1/S2, irregularly irregular, NRMG Respiratory: CTABL, no wheezes or rhonci Gastrointestinal: Soft, NTTP, non-distended MSK: Moving all extremities, no pitting edema in BLE  Labs:  CBC BMET  Recent Labs  Lab 05/11/22 0254  WBC 7.7  HGB 10.0*  HCT 33.2*  PLT 164   Recent Labs  Lab 05/11/22 0254  NA 138  K 3.8  CL 106  CO2 22  BUN 35*  CREATININE 1.89*  GLUCOSE 107*  CALCIUM 8.8*    EKG: My own interpretation (not copied from electronic read)  A. Fib w/ vent rate 109, Qtc  639   Imaging Studies Performed: CT Head Wo Contrast  Result Date: 05/10/2022 CLINICAL DATA:  Status post fall. EXAM: CT HEAD WITHOUT CONTRAST TECHNIQUE: Contiguous axial images were obtained from the base of the skull through the vertex  without intravenous contrast. RADIATION DOSE REDUCTION: This exam was performed according to the departmental dose-optimization program which includes automated exposure control, adjustment of the mA and/or kV according to patient size and/or use of iterative reconstruction technique. COMPARISON:  April 07, 2022 FINDINGS: Brain: There is mild cerebral atrophy with widening of the extra-axial spaces and ventricular dilatation. There are areas of decreased attenuation within the white matter tracts of the supratentorial brain, consistent with microvascular disease changes. Vascular: No hyperdense vessel or unexpected calcification. Skull: A chronic nasal bone fracture is again seen. Sinuses/Orbits: No acute finding. Other: None. IMPRESSION: 1. No acute intracranial abnormality. 2. Generalized cerebral atrophy with chronic white matter small vessel ischemic changes. Electronically Signed   By: Aram Candela M.D.   On: 05/10/2022 22:31   CT Chest Wo Contrast  Result Date: 05/10/2022 CLINICAL DATA:  Status post fall. EXAM: CT CHEST WITHOUT CONTRAST TECHNIQUE: Multidetector CT imaging of the chest was performed following the standard protocol without IV contrast. RADIATION DOSE REDUCTION: This exam was performed according to the departmental dose-optimization program which includes automated exposure control, adjustment of the mA and/or kV according to patient size and/or use of iterative reconstruction technique. COMPARISON:  April 05, 2022 FINDINGS: Cardiovascular: There is marked severity calcification of the thoracic aorta, without evidence of aortic aneurysm. There  is moderate severity cardiomegaly with a stable, markedly dilated left atrium and moderate severity coronary artery calcification. An artificial mitral valve is seen. No pericardial effusion. Mediastinum/Nodes: No enlarged mediastinal or axillary lymph nodes. Thyroid gland, trachea, and esophagus demonstrate no significant findings. Lungs/Pleura:  Mild atelectasis is seen within the bilateral lung bases, right slightly greater than left. A very small amount of pleural fluid is seen on the left. No pneumothorax is identified. Upper Abdomen: There is a small hiatal hernia. The liver is cirrhotic in appearance. Musculoskeletal: Multiple sternal wires are seen. Acute, posterior seventh and eighth left rib fractures are noted. Multilevel degenerative changes are present throughout the thoracic spine. IMPRESSION: 1. Acute, posterior seventh and eighth left rib fractures. 2. Very small left pleural effusion. 3. Moderate severity cardiomegaly with findings consistent with history of mitral valve replacement. 4. Small hiatal hernia. 5. Hepatic cirrhosis. 6. Aortic atherosclerosis. Aortic Atherosclerosis (ICD10-I70.0). Electronically Signed   By: Aram Candela M.D.   On: 05/10/2022 22:28   DG Chest 2 View  Result Date: 05/10/2022 CLINICAL DATA:  Sternal chest pain EXAM: CHEST - 2 VIEW COMPARISON:  04/07/2022 FINDINGS: Lungs are clear. No pneumothorax or pleural effusion. Median sternotomy has been performed. Moderate cardiomegaly is stable. Pulmonary vascularity is normal. Osseous structures are age appropriate. Thoracic spine is diffusely osteopenic. No acute bone abnormality identified. IMPRESSION: 1. No active cardiopulmonary disease. 2. Stable cardiomegaly. Electronically Signed   By: Helyn Numbers M.D.   On: 05/10/2022 21:36      Bess Kinds, MD 05/11/2022, 7:34 AM PGY-2, [facility]  FPTS Intern pager: [phone], text pages welcome Secure chat group [facility] Teaching Service

## 2022-05-11 NOTE — Assessment & Plan Note (Addendum)
Patient presents for witnessed mechanical fall at home. Patient recently admitted for fall at home 3/18 that resulted in femur and clavicular fracture. Patient believes leg has been a little week since fracture and may have contributed to the fall. Patient note's she was d/c from SNF, after hospitalization, on 4/11. Patient denies any prodromal signs, hear racing, or seizure like activity. Fall appears to be mechanical. Patient did not hit head, and Head CT was negative. -Orthostatic Vitals

## 2022-05-11 NOTE — Assessment & Plan Note (Signed)
K 3.0 on admission. Was repleted. Will continue to monitor -AM BMP

## 2022-05-11 NOTE — Assessment & Plan Note (Addendum)
Patient presents w/ 7th and 8th rib fractures on CT Chest, after witnessed mechanical fall at home. Will admit for pain control and pulmonary toilet. -Admit to FPTS, attending Dr. Lum Babe -Vitals per unit -Up with assistance -Fall precautions -Tylenol 500 mg q6h -Oxycodone 5 mg prn q6h -Pulmonary toilet -Incentive Spirometer -PT/OT eval/treat -Orthostatic Vitals -AM BMP & CBC -Vitamin D lvl

## 2022-05-11 NOTE — Progress Notes (Signed)
ANTICOAGULATION CONSULT NOTE - Initial Consult  Pharmacy Consult for warfarin Indication: atrial fibrillation and mechanical valve  Allergies  Allergen Reactions   Iodinated Contrast Media Rash    Pt stated in the past had broken out in a rash on back and itching.    Esomeprazole Magnesium     REACTION: stomach upset, nausea    Patient Measurements: Height:  (172.7 cm) Weight: 66.6 kg (146 lb 13.2 oz) IBW/kg (Calculated) : 63.9  Vital Signs: Temp: 97.8 F (36.6 C) (04/21 1307) Temp Source: Oral (04/21 1307) BP: 121/75 (04/21 1307) Pulse Rate: 85 (04/21 1307)  Labs: Recent Labs    05/10/22 2336 05/11/22 0254  HGB 9.8* 10.0*  HCT 33.2* 33.2*  PLT 168 164  LABPROT 33.2*  --   INR 3.3*  --   CREATININE 2.04* 1.89*  TROPONINIHS 13 17    Estimated Creatinine Clearance: 22.4 mL/min (A) (by C-G formula based on SCr of 1.89 mg/dL (H)).   Medical History: Past Medical History:  Diagnosis Date   Asthma    years ago   Chronic atrial fibrillation    Dilated cardiomyopathy    history of this, now resolved   Dysphagia    Hx   Fracture of tibial plateau 05/21/2015   GERD (gastroesophageal reflux disease)    GI bleed    Hemorrhoid 04/2014   bleeding   History of blood transfusion    Hypertension    Microcytic anemia    Nonsustained ventricular tachycardia    Osteoporosis    Protein in urine    RBBB (right bundle branch block)    Rheumatic mitral valve disease     Medications:  Scheduled:   acetaminophen  500 mg Oral Q6H   cholecalciferol  2,000 Units Oral Daily   dorzolamide-timolol  1 drop Both Eyes BID   furosemide  40 mg Oral Daily   latanoprost  1 drop Both Eyes QHS   metoprolol succinate  100 mg Oral Daily   metoprolol succinate  50 mg Oral QHS   pantoprazole  40 mg Oral Daily   senna-docusate  1 tablet Oral BID   tamsulosin  0.4 mg Oral Daily   warfarin  5 mg Oral Daily   Warfarin - Pharmacist Dosing Inpatient   Does not apply q1600     Assessment: Patient is an 78 yof admitted s/p a fall with rib fractures. Pharmacy team asked to dose warfarin while inpatient. CT head showed no acute intracranial abnormality. INR on admission was therapeutic at 3.3. hgb low but stable at 10, plts stable and wnl at 164.  4/16 anticoag clinic note states that her dose is 5 mg every day and this is a stable dose.   Goal of Therapy:  INR 2.5-3.5 Monitor platelets by anticoagulation protocol: Yes   Plan:  Warfarin 5 mg PO  Daily INR, CBC  Consider QOD INR/CBC once stable as an IP   Fara Olden, PharmD, BCPS, BCOP Clinical Pharmacist 05/11/2022,1:48 PM

## 2022-05-11 NOTE — ED Notes (Signed)
ED TO INPATIENT HANDOFF REPORT  ED Nurse Name and Phone #: Grant Fontana PM [phone]  S Name/Age/Gender [name] 87 y.o. female Room/Bed: 032C/032C  Code Status   Code Status: Prior  Home/SNF/Other Home Patient oriented to: self, place, and situation Is this baseline? Yes   Triage Complete: Triage complete  Chief Complaint Rib fractures [S22.49XA]  Triage Note Patient arrived with EMS from home , her wheelchair fell on her chest this evening when she lost her balance and almost fell , family assisted her after the incident . No LOC/denies head injury , she takes Coumadin for AFIB . Patient endorses mild central chest pain .    Allergies Allergies  Allergen Reactions   Iodinated Contrast Media Rash    Pt stated in the past had broken out in a rash on back and itching.    Esomeprazole Magnesium     REACTION: stomach upset, nausea    Level of Care/Admitting Diagnosis ED Disposition     ED Disposition  Admit   Condition  --   Comment  Hospital Area: [facility] [[id number]]  Level of Care: Med-Surg [16]  May place patient in observation at [facility] or [facility] if equivalent level of care is available:: No  Covid Evaluation: Asymptomatic - no recent exposure (last 10 days) testing not required  Diagnosis: Rib fractures [454098]  Admitting Physician: Doreene Eland [[id number]]  Attending Physician: Esmeralda Arthur          B Medical/Surgery History Past Medical History:  Diagnosis Date   Asthma    years ago   Chronic atrial fibrillation    Dilated cardiomyopathy    history of this, now resolved   Dysphagia    Hx   Fracture of tibial plateau 05/21/2015   GERD (gastroesophageal reflux disease)    GI bleed    Hemorrhoid 04/2014   bleeding   History of blood transfusion    Hypertension    Microcytic anemia    Nonsustained ventricular tachycardia    Osteoporosis    Protein in urine    RBBB (right bundle Kayhan Boardley block)     Rheumatic mitral valve disease    Past Surgical History:  Procedure Laterality Date   CHOLECYSTECTOMY  2005   COLONOSCOPY     COLONOSCOPY W/ POLYPECTOMY     EYE SURGERY  04/2014   HEMICOLECTOMY  2008   forpolyps   MITOMYCIN C APPLICATION Right 05/17/2014   Procedure: MITOMYCIN C APPLICATION;  Surgeon: Chalmers Guest, MD;  Location: [facility] OR;  Service: Ophthalmology;  Laterality: Right;   MITRAL VALVE REPLACEMENT  1987   St Jude mechanical valve   RIGHT/LEFT HEART CATH AND CORONARY ANGIOGRAPHY N/A 04/02/2021   Procedure: RIGHT/LEFT HEART CATH AND CORONARY ANGIOGRAPHY;  Surgeon: Dolores Patty, MD;  Location: [facility] INVASIVE CV LAB;  Service: Cardiovascular;  Laterality: N/A;   TOTAL HIP ARTHROPLASTY Right 04/08/2022   Procedure: TOTAL HIP ARTHROPLASTY;  Surgeon: Sheral Apley, MD;  Location: [facility] OR;  Service: Orthopedics;  Laterality: Right;   TRABECULECTOMY Right 05/17/2014   Procedure: TRABECULECTOMY WITH MITOMYCIN RIGHT EYE;  Surgeon: Chalmers Guest, MD;  Location: [facility] OR;  Service: Ophthalmology;  Laterality: Right;   TUBAL LIGATION     US ECHOCARDIOGRAPHY  02/2008, 03/2006, 06/2004     A IV Location/Drains/Wounds Patient Lines/Drains/Airways Status     Active Line/Drains/Airways     Name Placement date Placement time Site Days   External Urinary Catheter 04/04/22  1810  --  37            Intake/Output Last 24 hours No intake or output data in the 24 hours ending 05/11/22 0108  Labs/Imaging Results for orders placed or performed during the hospital encounter of 05/10/22 (from the past 48 hour(s))  Basic metabolic panel     Status: Abnormal   Collection Time: 05/10/22 11:36 PM  Result Value Ref Range   Sodium 138 135 - 145 mmol/L   Potassium 3.0 (L) 3.5 - 5.1 mmol/L   Chloride 106 98 - 111 mmol/L   CO2 21 (L) 22 - 32 mmol/L   Glucose, Bld 122 (H) 70 - 99 mg/dL    Comment: Glucose reference range applies only to samples taken after fasting for at least 8 hours.   BUN 35 (H)  8 - 23 mg/dL   Creatinine, Ser 1.61 (H) 0.44 - 1.00 mg/dL   Calcium 8.8 (L) 8.9 - 10.3 mg/dL   GFR, Estimated 24 (L) >60 mL/min    Comment: (NOTE) Calculated using the CKD-EPI Creatinine Equation (2021)    Anion gap 11 5 - 15    Comment: Performed at [facility], [address] [place], [place] [address]  CBC with Differential     Status: Abnormal   Collection Time: 05/10/22 11:36 PM  Result Value Ref Range   WBC 7.3 4.0 - 10.5 K/uL   RBC 3.52 (L) 3.87 - 5.11 MIL/uL   Hemoglobin 9.8 (L) 12.0 - 15.0 g/dL   HCT 54.0 (L) 98.1 - 19.1 %   MCV 94.3 80.0 - 100.0 fL   MCH 27.8 26.0 - 34.0 pg   MCHC 29.5 (L) 30.0 - 36.0 g/dL   RDW 47.8 (H) 29.5 - 62.1 %   Platelets 168 150 - 400 K/uL   nRBC 0.0 0.0 - 0.2 %   Neutrophils Relative % 66 %   Neutro Abs 4.9 1.7 - 7.7 K/uL   Lymphocytes Relative 21 %   Lymphs Abs 1.5 0.7 - 4.0 K/uL   Monocytes Relative 11 %   Monocytes Absolute 0.8 0.1 - 1.0 K/uL   Eosinophils Relative 1 %   Eosinophils Absolute 0.1 0.0 - 0.5 K/uL   Basophils Relative 0 %   Basophils Absolute 0.0 0.0 - 0.1 K/uL   Immature Granulocytes 1 %   Abs Immature Granulocytes 0.07 0.00 - 0.07 K/uL    Comment: Performed at [facility], [address] [place], [place] [address]  Protime-INR     Status: Abnormal   Collection Time: 05/10/22 11:36 PM  Result Value Ref Range   Prothrombin Time 33.2 (H) 11.4 - 15.2 seconds   INR 3.3 (H) 0.8 - 1.2    Comment: (NOTE) INR goal varies based on device and disease states. Performed at [facility], [address] [place], [place] [address]   Troponin I (High Sensitivity)     Status: None   Collection Time: 05/10/22 11:36 PM  Result Value Ref Range   Troponin I (High Sensitivity) 13 <18 ng/L    Comment: (NOTE) Elevated high sensitivity troponin I (hsTnI) values and significant  changes across serial measurements may suggest ACS but many other  chronic and acute conditions are known to elevate hsTnI results.   Refer to the "Links" section for chest pain algorithms and additional  guidance. Performed at [facility], [address] [place], [place] [address]    CT Head Wo Contrast  Result Date: 05/10/2022 CLINICAL DATA:  Status  post fall. EXAM: CT HEAD WITHOUT CONTRAST TECHNIQUE: Contiguous axial images were obtained from the base of the skull through the vertex without intravenous contrast. RADIATION DOSE REDUCTION: This exam was performed according to the departmental dose-optimization program which includes automated exposure control, adjustment of the mA and/or kV according to patient size and/or use of iterative reconstruction technique. COMPARISON:  April 07, 2022 FINDINGS: Brain: There is mild cerebral atrophy with widening of the extra-axial spaces and ventricular dilatation. There are areas of decreased attenuation within the white matter tracts of the supratentorial brain, consistent with microvascular disease changes. Vascular: No hyperdense vessel or unexpected calcification. Skull: A chronic nasal bone fracture is again seen. Sinuses/Orbits: No acute finding. Other: None. IMPRESSION: 1. No acute intracranial abnormality. 2. Generalized cerebral atrophy with chronic white matter small vessel ischemic changes. Electronically Signed   By: Aram Candela M.D.   On: 05/10/2022 22:31   CT Chest Wo Contrast  Result Date: 05/10/2022 CLINICAL DATA:  Status post fall. EXAM: CT CHEST WITHOUT CONTRAST TECHNIQUE: Multidetector CT imaging of the chest was performed following the standard protocol without IV contrast. RADIATION DOSE REDUCTION: This exam was performed according to the departmental dose-optimization program which includes automated exposure control, adjustment of the mA and/or kV according to patient size and/or use of iterative reconstruction technique. COMPARISON:  April 05, 2022 FINDINGS: Cardiovascular: There is marked severity calcification of the thoracic aorta, without evidence  of aortic aneurysm. There is moderate severity cardiomegaly with a stable, markedly dilated left atrium and moderate severity coronary artery calcification. An artificial mitral valve is seen. No pericardial effusion. Mediastinum/Nodes: No enlarged mediastinal or axillary lymph nodes. Thyroid gland, trachea, and esophagus demonstrate no significant findings. Lungs/Pleura: Mild atelectasis is seen within the bilateral lung bases, right slightly greater than left. A very small amount of pleural fluid is seen on the left. No pneumothorax is identified. Upper Abdomen: There is a small hiatal hernia. The liver is cirrhotic in appearance. Musculoskeletal: Multiple sternal wires are seen. Acute, posterior seventh and eighth left rib fractures are noted. Multilevel degenerative changes are present throughout the thoracic spine. IMPRESSION: 1. Acute, posterior seventh and eighth left rib fractures. 2. Very small left pleural effusion. 3. Moderate severity cardiomegaly with findings consistent with history of mitral valve replacement. 4. Small hiatal hernia. 5. Hepatic cirrhosis. 6. Aortic atherosclerosis. Aortic Atherosclerosis (ICD10-I70.0). Electronically Signed   By: Aram Candela M.D.   On: 05/10/2022 22:28   DG Chest 2 View  Result Date: 05/10/2022 CLINICAL DATA:  Sternal chest pain EXAM: CHEST - 2 VIEW COMPARISON:  04/07/2022 FINDINGS: Lungs are clear. No pneumothorax or pleural effusion. Median sternotomy has been performed. Moderate cardiomegaly is stable. Pulmonary vascularity is normal. Osseous structures are age appropriate. Thoracic spine is diffusely osteopenic. No acute bone abnormality identified. IMPRESSION: 1. No active cardiopulmonary disease. 2. Stable cardiomegaly. Electronically Signed   By: Helyn Numbers M.D.   On: 05/10/2022 21:36    Pending Labs Unresulted Labs (From admission, onward)    None       Vitals/Pain Today's Vitals   05/10/22 2230 05/10/22 2355 05/10/22 2355 05/11/22  0000  BP: (!) 142/96   (!) 145/97  Pulse: 95   92  Resp: 17   (!) 21  Temp:  98.5 F (36.9 C)    TempSrc:  Oral    SpO2: 95%   98%  PainSc:   6      Isolation Precautions No active isolations  Medications Medications  potassium chloride SA (KLOR-CON  M) CR tablet 40 mEq (has no administration in time range)  HYDROcodone-acetaminophen (NORCO/VICODIN) 5-325 MG per tablet 1 tablet (1 tablet Oral Given 05/10/22 2230)    Mobility walks with device     Focused Assessments     R Recommendations: See Admitting Provider Note  Report given to:   Additional Notes:

## 2022-05-12 ENCOUNTER — Telehealth: Payer: Self-pay | Admitting: Family Medicine

## 2022-05-12 ENCOUNTER — Other Ambulatory Visit (HOSPITAL_COMMUNITY): Payer: Self-pay

## 2022-05-12 DIAGNOSIS — Z87891 Personal history of nicotine dependence: Secondary | ICD-10-CM | POA: Diagnosis not present

## 2022-05-12 DIAGNOSIS — S20219A Contusion of unspecified front wall of thorax, initial encounter: Secondary | ICD-10-CM | POA: Diagnosis not present

## 2022-05-12 DIAGNOSIS — J45909 Unspecified asthma, uncomplicated: Secondary | ICD-10-CM | POA: Diagnosis not present

## 2022-05-12 DIAGNOSIS — Z7901 Long term (current) use of anticoagulants: Secondary | ICD-10-CM | POA: Diagnosis not present

## 2022-05-12 DIAGNOSIS — S2242XA Multiple fractures of ribs, left side, initial encounter for closed fracture: Secondary | ICD-10-CM | POA: Diagnosis not present

## 2022-05-12 DIAGNOSIS — I482 Chronic atrial fibrillation, unspecified: Secondary | ICD-10-CM | POA: Diagnosis not present

## 2022-05-12 DIAGNOSIS — E876 Hypokalemia: Secondary | ICD-10-CM | POA: Diagnosis not present

## 2022-05-12 DIAGNOSIS — I10 Essential (primary) hypertension: Secondary | ICD-10-CM | POA: Diagnosis not present

## 2022-05-12 LAB — BASIC METABOLIC PANEL
Anion gap: 10 (ref 5–15)
BUN: 27 mg/dL — ABNORMAL HIGH (ref 8–23)
CO2: 24 mmol/L (ref 22–32)
Calcium: 9 mg/dL (ref 8.9–10.3)
Chloride: 106 mmol/L (ref 98–111)
Creatinine, Ser: 1.62 mg/dL — ABNORMAL HIGH (ref 0.44–1.00)
GFR, Estimated: 31 mL/min — ABNORMAL LOW (ref >60)
Glucose, Bld: 88 mg/dL (ref 70–99)
Potassium: 3.6 mmol/L (ref 3.5–5.1)
Sodium: 140 mmol/L (ref 135–145)

## 2022-05-12 LAB — CBC
HCT: 32.7 % — ABNORMAL LOW (ref 36.0–46.0)
Hemoglobin: 9.9 g/dL — ABNORMAL LOW (ref 12.0–15.0)
MCH: 28 pg (ref 26.0–34.0)
MCHC: 30.3 g/dL (ref 30.0–36.0)
MCV: 92.4 fL (ref 80.0–100.0)
Platelets: 150 10*3/uL (ref 150–400)
RBC: 3.54 MIL/uL — ABNORMAL LOW (ref 3.87–5.11)
RDW: 16.3 % — ABNORMAL HIGH (ref 11.5–15.5)
WBC: 5.1 10*3/uL (ref 4.0–10.5)
nRBC: 0 % (ref 0.0–0.2)

## 2022-05-12 MED ORDER — ALENDRONATE SODIUM 70 MG PO TABS
70.0000 mg | ORAL_TABLET | ORAL | 1 refills | Status: DC
  Filled 2022-05-12: qty 4, 28d supply, fill #0

## 2022-05-12 MED ORDER — OXYCODONE HCL 5 MG PO TABS
5.0000 mg | ORAL_TABLET | Freq: Four times a day (QID) | ORAL | 0 refills | Status: DC | PRN
  Filled 2022-05-12: qty 15, 7d supply, fill #0

## 2022-05-12 MED ORDER — POLYETHYLENE GLYCOL 3350 17 G PO PACK: 17.0000 g | PACK | Freq: Every day | ORAL | 0 refills | Status: DC | PRN

## 2022-05-12 MED ORDER — HYDRALAZINE HCL 10 MG PO TABS
10.0000 mg | ORAL_TABLET | Freq: Three times a day (TID) | ORAL | 0 refills | Status: DC
  Filled 2022-05-12: qty 30, 10d supply, fill #0

## 2022-05-12 MED ORDER — ACETAMINOPHEN 500 MG PO TABS
1000.0000 mg | ORAL_TABLET | Freq: Four times a day (QID) | ORAL | 0 refills | Status: DC
  Filled 2022-05-12: qty 30, 4d supply, fill #0

## 2022-05-12 NOTE — TOC Transition Note (Signed)
Transition of Care (TOC) - CM/SW Discharge Note Donn Pierini RN, BSN Transitions of Care Unit 4E- RN Case Manager See Treatment Team for direct phone # Cross Coverage 5N  Patient Details  Name: [name] MRN: [id number] Date of Birth: [birth year]  Transition of Care [facility]) CM/SW Contact:  Darrold Span, RN Phone Number: 05/12/2022, 2:21 PM   Clinical Narrative:    Pt stable for transition home today, HHPT order placed.   CM in to speak with pt and provide choice Per CMS guidelines from PhoneFinancing.pl website with star ratings (copy placed in shadow chart), per pt she had HH in past with [facility] however she does not want to use them again, and states the last time she went home [facility] agency never came out.  Pt has selected [facility] this time to see if they can service.   Address, phone # and PCP all confirmed.  Pt voiced she will be staying with her daughter-  [address], [place] [address]  Daughter to come provide transport home.   Pt reports she has all needed DME at home.   Call made to [facility] for HHPT referral. Liaison Angie to check on staffing  1415- received notice from Angie w/ [facility] that they can accept referral and will see pt this week.   No further TOC needs noted.    Final next level of care: Home w Home Health Services Barriers to Discharge: No Barriers Identified   Patient Goals and CMS Choice CMS Medicare.gov Compare Post Acute Care list provided to:: Patient Choice offered to / list presented to : Patient  Discharge Placement                   Home w/ [facility]      Discharge Plan and Services Additional resources added to the After Visit Summary for     Discharge Planning Services: CM Consult Post Acute Care Choice: Home Health          DME Arranged: N/A DME Agency: NA       HH Arranged: PT HH Agency: [facility] Date [facility] Agency Contacted: 05/12/22 Time HH Agency Contacted: 1315 Representative spoke  with at [facility] Agency: Marylene Land  Social Determinants of Health (SDOH) Interventions SDOH Screenings   Food Insecurity: No Food Insecurity (05/11/2022)  Housing: Low Risk  (05/11/2022)  Transportation Needs: No Transportation Needs (05/11/2022)  Utilities: Not At Risk (05/11/2022)  Depression (PHQ2-9): Low Risk  (09/26/2021)  Financial Resource Strain: Low Risk  (10/07/2018)  Physical Activity: Insufficiently Active (10/07/2018)  Social Connections: Moderately Isolated (10/07/2018)  Stress: No Stress Concern Present (10/07/2018)  Tobacco Use: Medium Risk (05/11/2022)     Readmission Risk Interventions     No data to display

## 2022-05-12 NOTE — Progress Notes (Signed)
PT Cancellation Note  Patient Details Name: [name] MRN: [id number] DOB: 1935/11/23   Cancelled Treatment:    Reason Eval/Treat Not Completed: Other (comment).  Talked with pt about having therapy before her daughter gets to hosp and pt refuses, although nursing reports she is going to be awhile.  Follow up as time allows.   Ivar Drape 05/12/2022, 2:46 PM  Samul Dada, PT PhD Acute Rehab Dept. Number: [facility] [id number] and [facility] [phone]

## 2022-05-12 NOTE — Telephone Encounter (Signed)
Daughter reports patient has fallen three times in the last month; Has broken rib, hip, and shoulder blade.  Made appt for May 8th. Currently in the hospital to be discharged today.    Phone number [phone]

## 2022-05-12 NOTE — Evaluation (Signed)
Occupational Therapy Evaluation Patient Details Name: [name] MRN: [id number] DOB: 03-04-1935 Today's Date: 05/12/2022   History of Present Illness Pt is a 87 y.o. F who presents 05/10/2022 with witnessed mechanical fall and 7th and 8th rib fractures on CT chest. 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   Clinical Impression   Patient admitted for the diagnosis above.  PTA she lives at home, and has family assist with iADL.  Patient has had multiple falls in the recent past, and would probably benefit from transitioning to a local ALF for assist as needed.  Currently patient does struggle to stand from lower surfaces, but is closer to supervision from higher surfaces.  In addition, she is supervision for mobility in the room/toileting, and Min A for lower body ADL from a sit to stand level.  OT is indicated in the acute setting to address deficits, and HH can be considered for post acute rehab.        Recommendations for follow up therapy are one component of a multi-disciplinary discharge planning process, led by the attending physician.  Recommendations may be updated based on patient status, additional functional criteria and insurance authorization.   Assistance Recommended at Discharge Intermittent Supervision/Assistance  Patient can return home with the following Assist for transportation;Assistance with cooking/housework    Functional Status Assessment  Patient has had a recent decline in their functional status and demonstrates the ability to make significant improvements in function in a reasonable and predictable amount of time.  Equipment Recommendations  None recommended by OT    Recommendations for Other Services       Precautions / Restrictions Precautions Precautions: Fall Precaution Comments:  L rib fxs Restrictions Weight Bearing Restrictions: Yes RUE Weight Bearing: Weight bear through elbow only RLE Weight Bearing: Weight bearing as tolerated      Mobility Bed Mobility   Bed Mobility: Supine to Sit     Supine to sit: Supervision     General bed mobility comments: Increased time and use of bed functions    Transfers Overall transfer level: Needs assistance Equipment used: Right platform walker Transfers: Sit to/from Stand Sit to Stand: Supervision, Min assist           General transfer comment: Min A from lower surfaces, Supervision from elevated      Balance Overall balance assessment: Needs assistance Sitting-balance support: Feet supported Sitting balance-Leahy Scale: Good     Standing balance support: Reliant on assistive device for balance Standing balance-Leahy Scale: Poor                             ADL either performed or assessed with clinical judgement   ADL Overall ADL's : Needs assistance/impaired Eating/Feeding: Independent;Sitting   Grooming: Wash/dry hands;Wash/dry face;Supervision/safety;Standing   Upper Body Bathing: Set up;Sitting   Lower Body Bathing: Min guard;Sit to/from stand Lower Body Bathing Details (indicate cue type and reason): Min Guard for initial stand, supervision once standing Upper Body Dressing : Set up;Sitting   Lower Body Dressing: Min guard;Sit to/from stand   Toilet Transfer: Supervision/safety;Regular Toilet;Rolling walker (2 wheels)   Toileting- Architect and Hygiene: Supervision/safety;Sit to/from stand               Vision Baseline Vision/History: 1 Wears glasses Patient Visual Report: No change from  baseline       Perception     Praxis      Pertinent Vitals/Pain Pain Assessment Faces Pain Scale: Hurts even more Pain Location: chest with inhalation and movement Pain Descriptors / Indicators: Tender Pain Intervention(s): Monitored during session     Hand  Dominance Right   Extremity/Trunk Assessment Upper Extremity Assessment Upper Extremity Assessment: Overall WFL for tasks assessed;RUE deficits/detail RUE Deficits / Details: Not complaining of any real shoulder discomfort   Lower Extremity Assessment Lower Extremity Assessment: Defer to PT evaluation   Cervical / Trunk Assessment Cervical / Trunk Assessment: Normal   Communication Communication Communication: HOH   Cognition Arousal/Alertness: Awake/alert Behavior During Therapy: WFL for tasks assessed/performed Overall Cognitive Status: Within Functional Limits for tasks assessed                                       General Comments   VSS on RA    Exercises     Shoulder Instructions      Home Living Family/patient expects to be discharged to:: Private residence Living Arrangements: Alone Available Help at Discharge: Family;Available PRN/intermittently Type of Home: Apartment Home Access: Level entry     Home Layout: One level     Bathroom Shower/Tub: Producer, television/film/video: Handicapped height Bathroom Accessibility: Yes How Accessible: Accessible via walker Home Equipment: Rollator (4 wheels);Cane - single point;Shower seat;BSC/3in1          Prior Functioning/Environment Prior Level of Function : Needs assist             Mobility Comments: had returned to using Rollator ADLs Comments: granddaughter was assisting with IADL's        OT Problem List: Impaired balance (sitting and/or standing);Decreased strength;Pain      OT Treatment/Interventions: Self-care/ADL training;Therapeutic activities;Patient/family education;DME and/or AE instruction;Balance training    OT Goals(Current goals can be found in the care plan section) Acute Rehab OT Goals Patient Stated Goal: Return home OT Goal Formulation: With patient Time For Goal Achievement: 05/26/22 Potential to Achieve Goals: Good ADL Goals Pt Will Perform Grooming: with  modified independence;standing Pt Will Perform Lower Body Dressing: with modified independence;sit to/from stand Pt Will Transfer to Toilet: with modified independence;ambulating;regular height toilet  OT Frequency: Min 2X/week    Co-evaluation              AM-PAC OT "6 Clicks" Daily Activity     Outcome Measure Help from another person eating meals?: None Help from another person taking care of personal grooming?: A Little Help from another person toileting, which includes using toliet, bedpan, or urinal?: A Little Help from another person bathing (including washing, rinsing, drying)?: A Little Help from another person to put on and taking off regular upper body clothing?: None Help from another person to put on and taking off regular lower body clothing?: A Little 6 Click Score: 20   End of Session Equipment Utilized During Treatment: Rolling walker (2 wheels);Other (comment) (R PF)  Activity Tolerance: Patient tolerated treatment well Patient left: in chair;with call bell/phone within reach;with chair alarm set  OT Visit Diagnosis: Unsteadiness on feet (R26.81)                Time: 0981-1914 OT Time Calculation (min): 23 min Charges:  OT General Charges $OT Visit: 1 Visit OT Evaluation $OT Eval Moderate Complexity: 1 Mod OT Treatments $Self Care/Home Management :  8-22 mins  05/12/2022  RP, OTR/L  Acute Rehabilitation Services  Office:  [phone]   Suzanna Obey 05/12/2022, 8:54 AM

## 2022-05-12 NOTE — Discharge Instructions (Addendum)
Dear [name],   Thank you so much for allowing [place] to be part of your care!  You were admitted to [facility] for rib fractures. Here we got your pain under control and were able to start methods to keep your lungs open like the breathing machine and physical therapy. You will be sent home with 15 oxycodone 5 mg tablets, please wean your use of these pills as needed. You were also started on fosamax. This will be a weekly medicine to help with osteoporosis.  Please follow up at the family medicine clinic.    POST-HOSPITAL & CARE INSTRUCTIONS Follow up at your family medicine appointment.  Please let PCP/Specialists know of any changes that were made.  Please see medications section of this packet for any medication changes.   DOCTOR'S APPOINTMENT & FOLLOW UP CARE INSTRUCTIONS  Future Appointments  Date Time Provider Department Center  05/14/2022  9:00 AM [facility] None  05/14/2022  1:30 PM Carney Living, MD [facility]  05/26/2022  2:15 PM Marinus Maw, MD [facility]  05/28/2022 10:10 AM Nestor Ramp, MD [facility]  06/25/2022  9:00 AM Freddie Breech, DPM [facility]    RETURN PRECAUTIONS: If you have increasing pain that you are not able to control at home or difficulty breathing please seek care.   Take care and be well!  Family Medicine Teaching Service  [place]  [facility]  [address] [place], [place] [address] [phone]

## 2022-05-12 NOTE — Hospital Course (Addendum)
[name] is a 87 y.o.female with a history of afib, recent hip fracture, chronic prolonged QT who was admitted to the [facility] Teaching Service at [facility] for rib fracure. Her hospital course is detailed below:  Rib Fracture  Patient presented after having a witnessed purely mechanical fall during which her rollator fell on top of her. CXR negative other than stable cardiomegaly. CT Chest notable for acute posterior seventh and eighth left rib fractures, very small left pleural fusion, small hiatal hernia, hepatic cirrhosis. CT Head negative for any acute intracranial abnormality, generalized cerebral atrophy with chronic white matter small vessel ischemic changes. She used incentive spirometry while inpatient and was sent home with one. She will be continued on vitamin D. She was started on alendronate for osteoporosis. She will be sent Pain regimen for home will be oxycodone 5 mg q 6 hours for 15 doses.   Other chronic conditions were medically managed with home medications and formulary alternatives as necessary  PCP Follow-up Recommendations: Started fosamax Follow up pain  Reduced hydral dose due to risk of orthostatic hypotension, consider adjusting

## 2022-05-12 NOTE — Plan of Care (Signed)

## 2022-05-12 NOTE — TOC CAGE-AID Note (Signed)
Transition of Care [facility]) - CAGE-AID Screening  Patient Details  Name: [name] MRN: [id number] Date of Birth: 02-Dec-1935  Clinical Narrative:  Patient denies alcohol and drug use, no need for resources at this time.  CAGE-AID Screening:   Have You Ever Felt You Ought to Cut Down on Your Drinking or Drug Use?: No Have People Annoyed You By Critizing Your Drinking Or Drug Use?: No Have You Felt Bad Or Guilty About Your Drinking Or Drug Use?: No Have You Ever Had a Drink or Used Drugs First Thing In The Morning to Steady Your Nerves or to Get Rid of a Hangover?: No CAGE-AID Score: 0  Substance Abuse Education Offered: No

## 2022-05-12 NOTE — Discharge Summary (Signed)
Family Medicine Teaching [facility] Discharge Summary  [name] Medical record number: [id number] Date of birth: 1935/12/16 Age: 87 y.o. Gender: female Date of Admission: 05/10/2022  Date of Discharge: 05/12/22  Admitting Physician: Doreene Eland, MD  Primary Care Provider: Nestor Ramp, MD Consultants: None  Indication for Hospitalization: Rib fractures with uncontrolled pain   Discharge Diagnoses/Problem List:  Principal Problem for Admission: Rib fracture Other Problems addressed during stay:  Principal Problem:   Rib fractures Active Problems:   Fall   Chronic atrial fibrillation   Hypokalemia   Prolonged QT interval    Brief Hospital Course:  [name] is a 87 y.o.female with a history of afib, recent hip fracture, chronic prolonged QT who was admitted to the [facility] Teaching Service at [facility] for rib fracure. Her hospital course is detailed below:  Rib Fracture  Patient presented after having a witnessed purely mechanical fall during which her rollator fell on top of her. CXR negative other than stable cardiomegaly. CT Chest notable for acute posterior seventh and eighth left rib fractures, very small left pleural fusion, small hiatal hernia, hepatic cirrhosis. CT Head negative for any acute intracranial abnormality, generalized cerebral atrophy with chronic white matter small vessel ischemic changes. She used incentive spirometry while inpatient and was sent home with one. She will be continued on vitamin D. She was started on alendronate for osteoporosis. She will be sent Pain regimen for home will be oxycodone 5 mg q 6 hours for 15 doses.   Other chronic conditions were medically managed with home medications and formulary alternatives as necessary  PCP Follow-up Recommendations: Started fosamax Follow up pain  Reduced hydral dose due to risk of orthostatic hypotension, consider adjusting   Disposition: Home with home health PT OT    Discharge Condition: stable   Discharge Exam:  Vitals:   05/11/22 2056 05/12/22 0901  BP: (!) 132/92 (!) 121/107  Pulse: 81 83  Resp:  18  Temp:  97.7 F (36.5 C)  SpO2:  99%   General: well appearing, in no acute distress CV: RRR, radial pulses equal and palpable, no BLE edema  Resp: Normal work of breathing on room air, CTAB Abd: Soft, non tender, non distended  Neuro: Alert & Oriented x 4   Significant Procedures: None  Significant Labs and Imaging:  Recent Labs  Lab 05/10/22 2336 05/11/22 0254 05/12/22 0714  WBC 7.3 7.7 5.1  HGB 9.8* 10.0* 9.9*  HCT 33.2* 33.2* 32.7*  PLT 168 164 150   Recent Labs  Lab 05/10/22 2336 05/11/22 0254 05/11/22 0740 05/12/22 0714  NA 138 138  --  140  K 3.0* 3.8  --  3.6  CL 106 106  --  106  CO2 21* 22  --  24  GLUCOSE 122* 107*  --  88  BUN 35* 35*  --  27*  CREATININE 2.04* 1.89*  --  1.62*  CALCIUM 8.8* 8.8*  --  9.0  MG  --   --  2.0  --     CT Chest wo Contrast 1. Acute, posterior seventh and eighth left rib fractures. 2. Very small left pleural effusion. 3. Moderate severity cardiomegaly with findings consistent with history of mitral valve replacement. 4. Small hiatal hernia. 5. Hepatic cirrhosis. 6. Aortic atherosclerosis.   Aortic Atherosclerosis (ICD10-I70.0).  CT Head wo Contrast  1. No acute intracranial abnormality. 2. Generalized cerebral atrophy with chronic white matter small vessel ischemic changes.  Results/Tests Pending  at Time of Discharge: None  Discharge Medications:  Allergies as of 05/12/2022       Reactions   Iodinated Contrast Media Rash   Pt stated in the past had broken out in a rash on back and itching.    Esomeprazole Magnesium    REACTION: stomach upset, nausea        Medication List     STOP taking these medications    oxycodone 5 MG capsule Commonly known as: OXY-IR Replaced by: oxyCODONE 5 MG immediate release tablet       TAKE these medications     acetaminophen 500 MG tablet Commonly known as: TYLENOL Take 2 tablets (1,000 mg total) by mouth every 6 (six) hours.   alendronate 70 MG tablet Commonly known as: Fosamax Take 1 tablet (70 mg total) by mouth every 7 (seven) days. Take with a full glass of water on an empty stomach.   dorzolamidel-timolol 22.3-6.8 MG/ML Soln ophthalmic solution Commonly known as: COSOPT Place 1 drop into both eyes in the morning and at bedtime.   FIBER PO Take 1 tablet by mouth daily as needed (Only for diarrhea).   furosemide 40 MG tablet Commonly known as: LASIX Take 1 tablet (40 mg total) by mouth daily.   hydrALAZINE 10 MG tablet Commonly known as: APRESOLINE Take 1 tablet (10 mg total) by mouth 3 (three) times daily. What changed:  medication strength how much to take   isosorbide mononitrate 30 MG 24 hr tablet Commonly known as: IMDUR Take 15 mg by mouth daily.   Lumigan 0.01 % Soln Generic drug: bimatoprost Place 1 drop into both eyes at bedtime.   metoprolol succinate 100 MG 24 hr tablet Commonly known as: TOPROL-XL Take 0.5-1 tablets (50-100 mg total) by mouth 2 (two) times daily. Please take 1 tablet ( ) in the morning and 0.5 tablet ( ) in the evening. What changed:  how much to take when to take this additional instructions   oxyCODONE 5 MG immediate release tablet Commonly known as: Oxy IR/ROXICODONE Take 1 tablet (5 mg total) by mouth every 6 (six) hours as needed for up to 15 doses for breakthrough pain. Replaces: oxycodone 5 MG capsule   pantoprazole 40 MG tablet Commonly known as: PROTONIX TAKE 1 TABLET(40 MG) BY MOUTH DAILY What changed: See the new instructions.   polyethylene glycol 17 g packet Commonly known as: MIRALAX / GLYCOLAX Take 17 g by mouth daily as needed for mild constipation.   senna-docusate 8.6-50 MG tablet Commonly known as: Senokot-S Take 1 tablet by mouth 2 (two) times daily.   tamsulosin 0.4 MG Caps capsule Commonly known  as: FLOMAX TAKE 1 CAPSULE(0.4 MG) BY MOUTH DAILY What changed: See the new instructions.   Vitamin D 50 MCG (2000 UT) tablet Take 1 tablet (2,000 Units total) by mouth daily.   warfarin 5 MG tablet Commonly known as: COUMADIN Take as directed. If you are unsure how to take this medication, talk to your nurse or doctor. Original instructions: Take 1 tablet (5 mg total) by mouth daily.        Discharge Instructions: Please refer to Patient Instructions section of EMR for full details.  Patient was counseled important signs and symptoms that should prompt return to medical care, changes in medications, dietary instructions, activity restrictions, and follow up appointments.   Follow-Up Appointments: 4/24 Appointment with Dr. Deirdre Priest in [facility]  5/8 Appointment with Dr. Jennette Kettle at [facility]   Lockie Mola, MD 05/12/2022, 1:07 PM PGY-1, [facility] Family  Medicine

## 2022-05-13 ENCOUNTER — Telehealth: Payer: Self-pay

## 2022-05-13 DIAGNOSIS — K7460 Unspecified cirrhosis of liver: Secondary | ICD-10-CM | POA: Diagnosis not present

## 2022-05-13 DIAGNOSIS — M800AXD Age-related osteoporosis with current pathological fracture, other site, subsequent encounter for fracture with routine healing: Secondary | ICD-10-CM | POA: Diagnosis not present

## 2022-05-13 DIAGNOSIS — G319 Degenerative disease of nervous system, unspecified: Secondary | ICD-10-CM | POA: Diagnosis not present

## 2022-05-13 DIAGNOSIS — I700 Atherosclerosis of aorta: Secondary | ICD-10-CM | POA: Diagnosis not present

## 2022-05-13 DIAGNOSIS — Z9181 History of falling: Secondary | ICD-10-CM | POA: Diagnosis not present

## 2022-05-13 DIAGNOSIS — I4891 Unspecified atrial fibrillation: Secondary | ICD-10-CM | POA: Diagnosis not present

## 2022-05-13 NOTE — Telephone Encounter (Signed)
[facility] PT calls nurse line for verbal orders as follows.   2x a week for 3 weeks 1x a week for 3 weeks   OT evaluation  Social Work Retail buyer order given for all disciplines.   She also reports her BP was 160/100. She reports the patient did not take her blood pressure medication yet.   Attempted to call patient to check on her and to make sure she has taken medications today. No answer.   Patient does have an apt tomorrow in clinic.

## 2022-05-14 ENCOUNTER — Ambulatory Visit: Payer: 59 | Attending: Internal Medicine | Admitting: *Deleted

## 2022-05-14 ENCOUNTER — Inpatient Hospital Stay: Payer: 59 | Admitting: Family Medicine

## 2022-05-14 DIAGNOSIS — Z5181 Encounter for therapeutic drug level monitoring: Secondary | ICD-10-CM

## 2022-05-14 DIAGNOSIS — Z952 Presence of prosthetic heart valve: Secondary | ICD-10-CM

## 2022-05-14 LAB — POCT INR: INR: 3.2 — AB (ref 2.0–3.0)

## 2022-05-14 NOTE — Patient Instructions (Addendum)
Description   Continue taking Warfarin 1/2 tablet ( ) daily. Recheck INR in 2 weeks.  Be consistent with your 1 serving of greens per week and your 1 ensures a week (normally 2 greens and 2 ensures per week).  Coumadin Clinic [phone].  Pt was started on Amiodarone at hospital discharge, loading dosage started 04/03/21.

## 2022-05-14 NOTE — Progress Notes (Deleted)
SUBJECTIVE:   CHIEF COMPLAINT / HPI:   From 4/22 DC summary ndication for Hospitalization: Rib fractures with uncontrolled pain    Discharge Diagnoses/Problem List:  Principal Problem for Admission: Rib fracture Other Problems addressed during stay:  Principal Problem:   Rib fractures Active Problems:   Fall   Chronic atrial fibrillation   Hypokalemia   Prolonged QT interval       Brief Hospital Course:  [name] is a 87 y.o.female with a history of afib, recent hip fracture, chronic prolonged QT who was admitted to the [facility] at [facility] for rib fracure. Her hospital course is detailed below:   Rib Fracture  Patient presented after having a witnessed purely mechanical fall during which her rollator fell on top of her. CXR negative other than stable cardiomegaly. CT Chest notable for acute posterior seventh and eighth left rib fractures, very small left pleural fusion, small hiatal hernia, hepatic cirrhosis. CT Head negative for any acute intracranial abnormality, generalized cerebral atrophy with chronic white matter small vessel ischemic changes. She used incentive spirometry while inpatient and was sent home with one. She will be continued on vitamin D. She was started on alendronate for osteoporosis. She will be sent Pain regimen for home will be oxycodone 5 mg q 6 hours for 15 doses.    Other chronic conditions were medically managed with home medications and formulary alternatives as necessary   PCP Follow-up Recommendations: Started fosamax Follow up pain  Reduced hydral dose due to risk of orthostatic hypotension, consider adjusting   PERTINENT  PMH / PSH: *** Patient Active Problem List   Diagnosis Date Noted   Femur fracture, right 04/08/2022    Priority: High   Closed nondisplaced fracture of lateral end of right clavicle 04/08/2022    Priority: High   Acute-on-chronic kidney injury     Priority: High   Hypertension 03/19/2006    Priority:  High   History of urinary retention 09/18/2021    Priority: Medium    Chronic combined systolic and diastolic congestive heart failure 04/17/2021    Priority: Medium    Gastro-esophageal reflux disease without esophagitis 04/25/2020    Priority: Medium    Spinal stenosis 04/25/2020    Priority: Medium    Weakness 04/25/2020    Priority: Medium    RHINITIS, ALLERGIC 03/19/2006    Priority: Medium    Diverticular disease of colon 04/25/2020    Priority: Low   Personal history of colonic polyps 04/25/2020    Priority: Low   Abnormal CT scan, liver 06/16/2018    Priority: Low   Hx of Tibial plateau fracture 2017 05/22/2015    Priority: Low   VENTRICULAR TACHYCARDIA 02/16/2008    Priority: Low   Osteoarthrosis involving lower leg 03/19/2006    Priority: Low   Osteoporosis 03/19/2006    Priority: Low   Rib fractures 05/11/2022    Priority: 1.   Fall 04/08/2022    Priority: 2.   Chronic atrial fibrillation     Priority: 3.   Hypokalemia 05/11/2022   Prolonged QT interval 05/11/2022   Femur fracture 04/08/2022   Closed subcapital fracture of right femur 04/08/2022   Chest pain 04/04/2022   Abnormal EKG 04/04/2022   H/O right hemicolectomy 06/16/2018   CKD (chronic kidney disease), stage III 05/31/2015   Long term current use of anticoagulant therapy 05/03/2010   History of mitral valve replacement - St Jude mechanical valve 02/16/2008    Current Outpatient Medications  Medication Instructions   Acetaminophen Extra Strength 1,000 mg, Oral, Every 6 hours   alendronate (FOSAMAX) 70 mg, Oral, Every 7 days, Take with a full glass of water on an empty stomach.   dorzolamidel-timolol (COSOPT) 22.3-6.8 MG/ML SOLN ophthalmic solution 1 drop, Both Eyes, 2 times daily   FIBER PO 1 tablet, Oral, Daily PRN   furosemide (LASIX) 40 mg, Oral, Daily   hydrALAZINE (APRESOLINE) 10 mg, Oral, 3 times daily   isosorbide mononitrate (IMDUR) 15 mg, Oral, Daily   LUMIGAN 0.01 % SOLN 1 drop, Both  Eyes, Daily at bedtime   metoprolol succinate (TOPROL-XL) 50-100 mg, Oral, 2 times daily, Please take 1 tablet ( ) in the morning and 0.5 tablet ( ) in the evening.   oxyCODONE (OXY IR/ROXICODONE) 5 mg, Oral, Every 6 hours PRN   pantoprazole (PROTONIX) 40 MG tablet TAKE 1 TABLET(40 MG) BY MOUTH DAILY   polyethylene glycol (MIRALAX / GLYCOLAX) 17 g, Oral, Daily PRN   senna-docusate (SENOKOT-S) 8.6-50 MG tablet 1 tablet, Oral, 2 times daily   tamsulosin (FLOMAX) 0.4 MG CAPS capsule TAKE 1 CAPSULE(0.4 MG) BY MOUTH DAILY   Vitamin D 2,000 Units, Oral, Daily   warfarin (COUMADIN) 5 mg, Oral, Daily       05/12/2022    3:02 PM 05/12/2022    9:01 AM 05/11/2022    8:56 PM  Vitals with BMI  Systolic 146 121 409  Diastolic 80 107 92  Pulse 85 83 81      OBJECTIVE:   There were no vitals taken for this visit.  ***  ASSESSMENT/PLAN:   There are no diagnoses linked to this encounter.   There are no Patient Instructions on file for this visit.   Carney Living, MD [facility]

## 2022-05-15 ENCOUNTER — Other Ambulatory Visit: Payer: Self-pay | Admitting: Family Medicine

## 2022-05-15 DIAGNOSIS — K7460 Unspecified cirrhosis of liver: Secondary | ICD-10-CM | POA: Diagnosis not present

## 2022-05-15 DIAGNOSIS — M800AXD Age-related osteoporosis with current pathological fracture, other site, subsequent encounter for fracture with routine healing: Secondary | ICD-10-CM | POA: Diagnosis not present

## 2022-05-15 DIAGNOSIS — I700 Atherosclerosis of aorta: Secondary | ICD-10-CM | POA: Diagnosis not present

## 2022-05-15 DIAGNOSIS — I4891 Unspecified atrial fibrillation: Secondary | ICD-10-CM | POA: Diagnosis not present

## 2022-05-15 DIAGNOSIS — Z9181 History of falling: Secondary | ICD-10-CM | POA: Diagnosis not present

## 2022-05-15 DIAGNOSIS — G319 Degenerative disease of nervous system, unspecified: Secondary | ICD-10-CM | POA: Diagnosis not present

## 2022-05-16 DIAGNOSIS — M25511 Pain in right shoulder: Secondary | ICD-10-CM | POA: Diagnosis not present

## 2022-05-16 DIAGNOSIS — S72041D Displaced fracture of base of neck of right femur, subsequent encounter for closed fracture with routine healing: Secondary | ICD-10-CM | POA: Diagnosis not present

## 2022-05-20 ENCOUNTER — Telehealth: Payer: Self-pay

## 2022-05-20 DIAGNOSIS — K7460 Unspecified cirrhosis of liver: Secondary | ICD-10-CM | POA: Diagnosis not present

## 2022-05-20 DIAGNOSIS — M800AXD Age-related osteoporosis with current pathological fracture, other site, subsequent encounter for fracture with routine healing: Secondary | ICD-10-CM | POA: Diagnosis not present

## 2022-05-20 DIAGNOSIS — G319 Degenerative disease of nervous system, unspecified: Secondary | ICD-10-CM | POA: Diagnosis not present

## 2022-05-20 DIAGNOSIS — I4891 Unspecified atrial fibrillation: Secondary | ICD-10-CM | POA: Diagnosis not present

## 2022-05-20 DIAGNOSIS — Z9181 History of falling: Secondary | ICD-10-CM | POA: Diagnosis not present

## 2022-05-20 DIAGNOSIS — I700 Atherosclerosis of aorta: Secondary | ICD-10-CM | POA: Diagnosis not present

## 2022-05-20 NOTE — Telephone Encounter (Signed)
[name] [facility] OT calls nurse line requesting verbal orders for [name] as follows.   1x a week for 5 weeks   Verbal order given.

## 2022-05-21 DIAGNOSIS — Z9181 History of falling: Secondary | ICD-10-CM | POA: Diagnosis not present

## 2022-05-21 DIAGNOSIS — K7460 Unspecified cirrhosis of liver: Secondary | ICD-10-CM | POA: Diagnosis not present

## 2022-05-21 DIAGNOSIS — I700 Atherosclerosis of aorta: Secondary | ICD-10-CM | POA: Diagnosis not present

## 2022-05-21 DIAGNOSIS — I4891 Unspecified atrial fibrillation: Secondary | ICD-10-CM | POA: Diagnosis not present

## 2022-05-21 DIAGNOSIS — G319 Degenerative disease of nervous system, unspecified: Secondary | ICD-10-CM | POA: Diagnosis not present

## 2022-05-21 DIAGNOSIS — M800AXD Age-related osteoporosis with current pathological fracture, other site, subsequent encounter for fracture with routine healing: Secondary | ICD-10-CM | POA: Diagnosis not present

## 2022-05-26 ENCOUNTER — Ambulatory Visit: Payer: 59 | Attending: Internal Medicine | Admitting: Internal Medicine

## 2022-05-26 ENCOUNTER — Encounter: Payer: Self-pay | Admitting: Internal Medicine

## 2022-05-26 VITALS — BP 154/100 | HR 99 | Ht 68.0 in | Wt 150.4 lb

## 2022-05-26 DIAGNOSIS — I482 Chronic atrial fibrillation, unspecified: Secondary | ICD-10-CM

## 2022-05-26 DIAGNOSIS — I10 Essential (primary) hypertension: Secondary | ICD-10-CM

## 2022-05-26 NOTE — Patient Instructions (Signed)
Medication Instructions:  Your physician recommends that you continue on your current medications as directed. Please refer to the Current Medication list given to you today.  *If you need a refill on your cardiac medications before your next appointment, please call your pharmacy*  Lab Work: None ordered.  If you have labs (blood work) drawn today and your tests are completely normal, you will receive your results only by: MyChart Message (if you have MyChart) OR A paper copy in the mail If you have any lab test that is abnormal or we need to change your treatment, we will call you to review the results.  Testing/Procedures: None ordered.  Follow-Up: At [facility], you and your health needs are our priority.  As part of our continuing mission to provide you with exceptional heart care, we have created designated Provider Care Teams.  These Care Teams include your primary Cardiologist (physician) and Advanced Practice Providers (APPs -  Physician Assistants and Nurse Practitioners) who all work together to provide you with the care you need, when you need it.  We recommend signing up for the patient portal called "MyChart".  Sign up information is provided on this After Visit Summary.  MyChart is used to connect with patients for Virtual Visits (Telemedicine).  Patients are able to view lab/test results, encounter notes, upcoming appointments, etc.  Non-urgent messages can be sent to your provider as well.   To learn more about what you can do with MyChart, go to [email or web address].    Your next appointment:   1 year(s)  The format for your next appointment:   In Person  Provider:   Gregg Taylor, MD{or one of the following Advanced Practice Providers on your designated Care Team:   Renee Ursuy, PA-C Michael "Andy" Tillery, PA-C          

## 2022-05-26 NOTE — Progress Notes (Signed)
HPI [name] returns today for followup. She is a pleasant 87 yo woman with MS s/p MVR, chronic atrial fib and NSVT. She has a h/o mod LV dysfunction. No syncope. She notes that she has some falls occaisionally. She has not injured herself. She denies chest pain. She has class 2 dyspnea. She has become more sedentary. Allergies  Allergen Reactions   Iodinated Contrast Media Rash    Pt stated in the past had broken out in a rash on back and itching.    Esomeprazole Magnesium     REACTION: stomach upset, nausea     Current Outpatient Medications  Medication Sig Dispense Refill   acetaminophen (TYLENOL) 500 MG tablet Take 2 tablets (1,000 mg total) by mouth every 6 (six) hours. 30 tablet 0   alendronate (FOSAMAX) 70 MG tablet Take 1 tablet (70 mg total) by mouth every 7 (seven) days. Take with a full glass of water on an empty stomach. 4 tablet 1   Cholecalciferol (VITAMIN D) 2000 units tablet Take 1 tablet (2,000 Units total) by mouth daily. 100 tablet 3   dorzolamidel-timolol (COSOPT) 22.3-6.8 MG/ML SOLN ophthalmic solution Place 1 drop into both eyes in the morning and at bedtime.  4   FIBER PO Take 1 tablet by mouth daily as needed (Only for diarrhea).     furosemide (LASIX) 40 MG tablet Take 1 tablet (40 mg total) by mouth daily. 90 tablet 3   hydrALAZINE (APRESOLINE) 10 MG tablet Take 1 tablet (10 mg total) by mouth 3 (three) times daily. 30 tablet 0   isosorbide mononitrate (IMDUR) 30 MG 24 hr tablet TAKE 1/2 TABLET BY MOUTH DAILY 45 tablet 1   LUMIGAN 0.01 % SOLN Place 1 drop into both eyes at bedtime.  0   metoprolol succinate (TOPROL-XL) 100 MG 24 hr tablet Take 0.5-1 tablets (50-100 mg total) by mouth 2 (two) times daily. Please take 1 tablet (100mg ) in the morning and 0.5 tablet (50mg ) in the evening. (Patient taking differently: Take 100 mg by mouth every evening.) 90 tablet 1   oxyCODONE (OXY IR/ROXICODONE) 5 MG immediate release tablet Take 1 tablet (5 mg total) by  mouth every 6 (six) hours as needed for up to 15 doses for breakthrough pain. 15 tablet 0   pantoprazole (PROTONIX) 40 MG tablet TAKE 1 TABLET(40 MG) BY MOUTH DAILY (Patient taking differently: Take 40 mg by mouth daily.) 90 tablet 3   polyethylene glycol (MIRALAX / GLYCOLAX) 17 g packet Take 17 g by mouth daily as needed for mild constipation. 14 each 0   senna-docusate (SENOKOT-S) 8.6-50 MG tablet Take 1 tablet by mouth 2 (two) times daily.     tamsulosin (FLOMAX) 0.4 MG CAPS capsule TAKE 1 CAPSULE(0.4 MG) BY MOUTH DAILY (Patient taking differently: Take 0.4 mg by mouth daily.) 90 capsule 3   warfarin (COUMADIN) 5 MG tablet Take 1 tablet (5 mg total) by mouth daily. (Patient taking differently: Take 5 mg by mouth daily.)     No current facility-administered medications for this visit.     Past Medical History:  Diagnosis Date   Asthma    years ago   Chronic atrial fibrillation (HCC)    Dilated cardiomyopathy (HCC)    history of this, now resolved   Dysphagia    Hx   Fracture of tibial plateau 05/21/2015   GERD (gastroesophageal reflux disease)    GI bleed    Hemorrhoid 04/2014   bleeding   History of blood  transfusion    Hypertension    Microcytic anemia    Nonsustained ventricular tachycardia (HCC)    Osteoporosis    Protein in urine    RBBB (right bundle branch block)    Rheumatic mitral valve disease     ROS:   All systems reviewed and negative except as noted in the HPI.   Past Surgical History:  Procedure Laterality Date   CHOLECYSTECTOMY  2005   COLONOSCOPY     COLONOSCOPY W/ POLYPECTOMY     EYE SURGERY  04/2014   HEMICOLECTOMY  2008   forpolyps   MITOMYCIN C APPLICATION Right 05/17/2014   Procedure: MITOMYCIN C APPLICATION;  Surgeon: Chalmers Guest, MD;  Location: [facility] OR;  Service: Ophthalmology;  Laterality: Right;   MITRAL VALVE REPLACEMENT  1987   St Jude mechanical valve   RIGHT/LEFT HEART CATH AND CORONARY ANGIOGRAPHY N/A 04/02/2021   Procedure: RIGHT/LEFT  HEART CATH AND CORONARY ANGIOGRAPHY;  Surgeon: Dolores Patty, MD;  Location: [facility] INVASIVE CV LAB;  Service: Cardiovascular;  Laterality: N/A;   TOTAL HIP ARTHROPLASTY Right 04/08/2022   Procedure: TOTAL HIP ARTHROPLASTY;  Surgeon: Sheral Apley, MD;  Location: [facility] OR;  Service: Orthopedics;  Laterality: Right;   TRABECULECTOMY Right 05/17/2014   Procedure: TRABECULECTOMY WITH MITOMYCIN RIGHT EYE;  Surgeon: Chalmers Guest, MD;  Location: [facility] OR;  Service: Ophthalmology;  Laterality: Right;   TUBAL LIGATION     US ECHOCARDIOGRAPHY  02/2008, 03/2006, 06/2004     Family History  Problem Relation Age of Onset   Cancer Father        prostate cancer   Hypertension Mother    Stroke Mother        CVA x 2   Cancer Sister        in the Bladder   Endometriosis Daughter    Stroke Sister    Blindness Sister    Hypertension Sister    Thyroid disease Daughter    Hypertension Daughter    Hypertension Daughter    Hypertension Daughter    Liver disease Daughter    Colon cancer Neg Hx    Breast cancer Neg Hx      Social History   Socioeconomic History   Marital status: Divorced    Spouse name: Not on file   Number of children: 5   Years of education: 12   Highest education level: High school graduate  Occupational History   Occupation: Retired  Tobacco Use   Smoking status: Former    Packs/day: 1.50    Years: 15.00    Additional pack years: 0.00    Total pack years: 22.50    Types: Cigarettes    Quit date: 1987    Years since quitting: 37.3   Smokeless tobacco: Never   Tobacco comments:    quit 1987  Vaping Use   Vaping Use: Never used  Substance and Sexual Activity   Alcohol use: No   Drug use: No   Sexual activity: Not Currently  Other Topics Concern   Not on file  Social History Narrative   Patient lives alone in [place].   Patient has 3 children who live near her and offer support. 2 live up [place].   Patient is active in [place] and enjoys walking for exercise.     Social Determinants of Health   Financial Resource Strain: Low Risk  (10/07/2018)   Overall Financial Resource Strain (CARDIA)    Difficulty of Paying Living Expenses: Not hard at all  Food  Insecurity: No Food Insecurity (05/11/2022)   Hunger Vital Sign    Worried About Running Out of Food in the Last Year: Never true    Ran Out of Food in the Last Year: Never true  Transportation Needs: No Transportation Needs (05/11/2022)   PRAPARE - Administrator, Civil Service (Medical): No    Lack of Transportation (Non-Medical): No  Physical Activity: Insufficiently Active (10/07/2018)   Exercise Vital Sign    Days of Exercise per Week: 3 days    Minutes of Exercise per Session: 30 min  Stress: No Stress Concern Present (10/07/2018)   [employer] of Occupational Health - Occupational Stress Questionnaire    Feeling of Stress : Not at all  Social Connections: Moderately Isolated (10/07/2018)   Social Connection and Isolation Panel [NHANES]    Frequency of Communication with Friends and Family: Three times a week    Frequency of Social Gatherings with Friends and Family: Three times a week    Attends Religious Services: 1 to 4 times per year    Active Member of Clubs or Organizations: No    Attends Banker Meetings: Never    Marital Status: Divorced  Catering manager Violence: Unknown (05/11/2022)   Humiliation, Afraid, Rape, and Kick questionnaire    Fear of Current or Ex-Partner: No    Emotionally Abused: No    Physically Abused: Not on file    Sexually Abused: No     BP (!) 154/100   Pulse 99   Ht 5\' 8"  (1.727 m)   Wt 150 lb 6.4 oz (68.2 kg)   SpO2 99%   BMI 22.87 kg/m   Physical Exam:  Well appearing NAD HEENT: Unremarkable Neck:  No JVD, no thyromegally Lymphatics:  No adenopathy Back:  No CVA tenderness Lungs:  Clear with no wheezes HEART:  Regular rate rhythm, no murmurs, no rubs, no clicks Abd:  soft, positive bowel sounds, no  organomegally, no rebound, no guarding Ext:  2 plus pulses, no edema, no cyanosis, no clubbing Skin:  No rashes no nodules Neuro:  CN II through XII intact, motor grossly intact  EKG - atrial fib with RBBB  DEVICE  Normal device function.  See PaceArt for details.   Assess/Plan: Chronic atrial fib - her VR is well controlled. No change in meds. Coags - she has not had any bleeding.  Mechanical MR - her valve sounds like it is closing properly.  NSVT - she has not had syncope. No change in her dose of the beta blocker.   Sharlot Gowda Diya Gervasi,MD

## 2022-05-27 DIAGNOSIS — M800AXD Age-related osteoporosis with current pathological fracture, other site, subsequent encounter for fracture with routine healing: Secondary | ICD-10-CM | POA: Diagnosis not present

## 2022-05-27 DIAGNOSIS — K7460 Unspecified cirrhosis of liver: Secondary | ICD-10-CM | POA: Diagnosis not present

## 2022-05-27 DIAGNOSIS — Z9181 History of falling: Secondary | ICD-10-CM | POA: Diagnosis not present

## 2022-05-27 DIAGNOSIS — G319 Degenerative disease of nervous system, unspecified: Secondary | ICD-10-CM | POA: Diagnosis not present

## 2022-05-27 DIAGNOSIS — I4891 Unspecified atrial fibrillation: Secondary | ICD-10-CM | POA: Diagnosis not present

## 2022-05-27 DIAGNOSIS — I700 Atherosclerosis of aorta: Secondary | ICD-10-CM | POA: Diagnosis not present

## 2022-05-28 ENCOUNTER — Encounter: Payer: Self-pay | Admitting: Family Medicine

## 2022-05-28 ENCOUNTER — Ambulatory Visit: Payer: 59 | Attending: Cardiology | Admitting: *Deleted

## 2022-05-28 ENCOUNTER — Ambulatory Visit (INDEPENDENT_AMBULATORY_CARE_PROVIDER_SITE_OTHER): Payer: 59 | Admitting: Family Medicine

## 2022-05-28 ENCOUNTER — Ambulatory Visit: Payer: 59 | Admitting: Family Medicine

## 2022-05-28 VITALS — BP 140/77 | HR 69 | Ht 68.0 in | Wt 150.2 lb

## 2022-05-28 DIAGNOSIS — Z7901 Long term (current) use of anticoagulants: Secondary | ICD-10-CM | POA: Diagnosis not present

## 2022-05-28 DIAGNOSIS — Z8781 Personal history of (healed) traumatic fracture: Secondary | ICD-10-CM

## 2022-05-28 DIAGNOSIS — N183 Chronic kidney disease, stage 3 unspecified: Secondary | ICD-10-CM

## 2022-05-28 DIAGNOSIS — R296 Repeated falls: Secondary | ICD-10-CM | POA: Diagnosis not present

## 2022-05-28 DIAGNOSIS — I10 Essential (primary) hypertension: Secondary | ICD-10-CM | POA: Diagnosis not present

## 2022-05-28 DIAGNOSIS — Z952 Presence of prosthetic heart valve: Secondary | ICD-10-CM | POA: Diagnosis not present

## 2022-05-28 DIAGNOSIS — M810 Age-related osteoporosis without current pathological fracture: Secondary | ICD-10-CM | POA: Diagnosis not present

## 2022-05-28 DIAGNOSIS — Z5181 Encounter for therapeutic drug level monitoring: Secondary | ICD-10-CM | POA: Diagnosis not present

## 2022-05-28 DIAGNOSIS — R269 Unspecified abnormalities of gait and mobility: Secondary | ICD-10-CM

## 2022-05-28 LAB — POCT INR: INR: 5.1 — AB (ref 2.0–3.0)

## 2022-05-28 MED ORDER — METOPROLOL SUCCINATE ER 100 MG PO TB24: 100.0000 mg | ORAL_TABLET | Freq: Every evening | ORAL | Status: DC

## 2022-05-28 MED ORDER — ALENDRONATE SODIUM 70 MG PO TABS: ORAL_TABLET | ORAL | 3 refills | Status: DC

## 2022-05-28 MED ORDER — TRAMADOL HCL 50 MG PO TABS: ORAL_TABLET | ORAL | 1 refills | Status: DC

## 2022-05-28 MED ORDER — HYDRALAZINE HCL 10 MG PO TABS: 10.0000 mg | ORAL_TABLET | Freq: Three times a day (TID) | ORAL | 3 refills | Status: DC

## 2022-05-28 NOTE — Assessment & Plan Note (Addendum)
Reviewed her recent hospital labs.  While in the hospital she had acute on chronic kidney injury.  Her creatinine bumped way up to 5.  On discharge she was about 2.  This may be her new baseline.  Will plan on rechecking it in 2 to 3 months when I see her back.  Will continue her current medications.  I would like to see her every 3 months.

## 2022-05-28 NOTE — Assessment & Plan Note (Signed)
Discussed how to appropriately take alendronate.  Refilled that for her.  She has not had any problems with that.

## 2022-05-28 NOTE — Assessment & Plan Note (Signed)
Hypertension: Initial blood pressure elevated today.  Improved on recheck.  Given her history of falls and some thought that this was related to orthostasis, I do not think we want to be too aggressive with managing her blood pressure.  Will follow-up when I see her back.  She also follows with cardiology.

## 2022-05-28 NOTE — Progress Notes (Signed)
CHIEF COMPLAINT / HPI: [name] is here with her daughter today for follow-up of recent hospitalization for another fall.  She broke several ribs.  She is having quite a bit of pain from that still.  They had given her some 5 mg oxycodone but she does not want to take that because it is a narcotic.  She does have 6-7 out of 10 pain when she is up and moving around however so she has not been doing as much as she would like to.  She has continued on the Tylenol.  That seems to help a little bit. 2.  Says she is eating a little bit better and her daughter agrees. 3.  They started her on a new medication, alendronate.  No problems with that. #4.  Recent fall: She has had several falls.  I think it would be beneficial for her to have a gait evaluation by physical therapy.  She agrees to have placed the referral. #5.  Has some questions about hearing aids.  She has existing hearing aids but is having some problem with them.  Wants to know if she needs a referral for audiology. 6.  Trying to get some personal care services and brings a form with her.  Also wonders if her insurance would pay for the Ensure she supposed to be taking twice a day.   PERTINENT  PMH / PSH: I have reviewed the patient's medications, allergies, past medical and surgical history, smoking status and updated in the EMR as appropriate.   OBJECTIVE:  BP (!) 140/77   Pulse 69   Ht 5\' 8"  (1.727 m)   Wt 150 lb 3.2 oz (68.1 kg)   SpO2 98%   BMI 22.84 kg/m   Vital signs reviewed. GENERAL: Well-developed, well-nourished, no acute distress. CARDIOVASCULAR: Irregularly irregular, slight murmur  LUNGS: Clear to auscultation bilaterally, no rales or wheeze. ABDOMEN: Soft positive bowel sounds NEURO: No gross focal neurological deficits except decreased hearing bilaterally. MSK: Movement of extremity x 4.  She rises from a chair with minimal assistance.  Her gait is a little wide-based and she has hunched posture.  A little unsteady  with walking.  ASSESSMENT / PLAN:  #1.  I will fill out paperwork for personal care services and a copy will be in the chart.  I will also send her a copy. 2.  I think Ensure twice a day is great idea for her.  I will send a prescription for that. 3.  Regarding her hearing, I forgot to give her the name of the audiologist.  I will send it to her through MyChart.  I think that is where we should start to see if they can help her with her existing hearing aids rather than try to get some new ones right off. 4.  Pain from rib fractures: She does not want to use the oxycodone.  The Tylenol is not covering her well enough.  I will continue the Tylenol.  I have given her prescription for tramadol twice a day.  Reviewed her QTc which is slightly elevated.  I think with judicious use of this tramadol she should be okay.  She does not have a lot of other options for pain management.  She will let me know if this is not working for her. 5.  I reviewed all of her medications with her and her daughter and gave them a copy.  I have sent in refills for some.  I would like to see  her back in 4 to 6 weeks. RECHECK CR AT NEXT VISIT  Osteoporosis Discussed how to appropriately take alendronate.  Refilled that for her.  She has not had any problems with that.  Hypertension Hypertension: Initial blood pressure elevated today.  Improved on recheck.  Given her history of falls and some thought that this was related to orthostasis, I do not think we want to be too aggressive with managing her blood pressure.  Will follow-up when I see her back.  She also follows with cardiology.  CKD (chronic kidney disease), stage III [facility]) Reviewed her recent hospital labs.  While in the hospital she had acute on chronic kidney injury.  Her creatinine bumped way up to 5.  On discharge she was about 2.  This may be her new baseline.  Will plan on rechecking it in 2 to 3 months when I see her back.  Will continue her current medications.   I would like to see her every 3 months.   Denny Levy MD

## 2022-05-28 NOTE — Patient Instructions (Addendum)
Description   Do not take any warfarin today and no warfarin tomorrow then continue taking Warfarin 1/2 tablet (5mg ) daily. Recheck INR in 2 weeks. Have some leafy greens today and remain  consistent with your 2 serving of greens per week and your 2 ensures a week. [facility] [phone] or [phone].  Pt was started on Amiodarone at hospital discharge, loading dosage started 04/03/21.

## 2022-05-28 NOTE — Patient Instructions (Signed)
I will send you a copy of the PCs paperwork. I have started you on a different pain medicine. Let me know if it does not work! Great to see you! RTC 6-8 weeks

## 2022-05-29 ENCOUNTER — Telehealth: Payer: Self-pay | Admitting: Family Medicine

## 2022-05-29 NOTE — Telephone Encounter (Signed)
[name] with [employer] called stating the patient is requesting a prescription be sent in for a walker and a cane.   Please Advise.   Thanks!

## 2022-05-30 DIAGNOSIS — G319 Degenerative disease of nervous system, unspecified: Secondary | ICD-10-CM | POA: Diagnosis not present

## 2022-05-30 DIAGNOSIS — K7460 Unspecified cirrhosis of liver: Secondary | ICD-10-CM | POA: Diagnosis not present

## 2022-05-30 DIAGNOSIS — I4891 Unspecified atrial fibrillation: Secondary | ICD-10-CM | POA: Diagnosis not present

## 2022-05-30 DIAGNOSIS — I700 Atherosclerosis of aorta: Secondary | ICD-10-CM | POA: Diagnosis not present

## 2022-05-30 DIAGNOSIS — Z9181 History of falling: Secondary | ICD-10-CM | POA: Diagnosis not present

## 2022-05-30 DIAGNOSIS — M800AXD Age-related osteoporosis with current pathological fracture, other site, subsequent encounter for fracture with routine healing: Secondary | ICD-10-CM | POA: Diagnosis not present

## 2022-06-02 ENCOUNTER — Other Ambulatory Visit: Payer: Self-pay | Admitting: Family Medicine

## 2022-06-02 DIAGNOSIS — G319 Degenerative disease of nervous system, unspecified: Secondary | ICD-10-CM | POA: Diagnosis not present

## 2022-06-02 DIAGNOSIS — R269 Unspecified abnormalities of gait and mobility: Secondary | ICD-10-CM

## 2022-06-02 DIAGNOSIS — I4891 Unspecified atrial fibrillation: Secondary | ICD-10-CM | POA: Diagnosis not present

## 2022-06-02 DIAGNOSIS — Z9181 History of falling: Secondary | ICD-10-CM | POA: Diagnosis not present

## 2022-06-02 DIAGNOSIS — S7291XA Unspecified fracture of right femur, initial encounter for closed fracture: Secondary | ICD-10-CM

## 2022-06-02 DIAGNOSIS — I700 Atherosclerosis of aorta: Secondary | ICD-10-CM | POA: Diagnosis not present

## 2022-06-02 DIAGNOSIS — K7460 Unspecified cirrhosis of liver: Secondary | ICD-10-CM | POA: Diagnosis not present

## 2022-06-02 DIAGNOSIS — M800AXD Age-related osteoporosis with current pathological fracture, other site, subsequent encounter for fracture with routine healing: Secondary | ICD-10-CM | POA: Diagnosis not present

## 2022-06-02 NOTE — Telephone Encounter (Signed)
DME ordered and Paige notified Denny Levy

## 2022-06-03 ENCOUNTER — Other Ambulatory Visit: Payer: Self-pay | Admitting: Family Medicine

## 2022-06-03 ENCOUNTER — Other Ambulatory Visit (HOSPITAL_COMMUNITY): Payer: Self-pay

## 2022-06-03 DIAGNOSIS — Z9181 History of falling: Secondary | ICD-10-CM | POA: Diagnosis not present

## 2022-06-03 DIAGNOSIS — G319 Degenerative disease of nervous system, unspecified: Secondary | ICD-10-CM | POA: Diagnosis not present

## 2022-06-03 DIAGNOSIS — M800AXD Age-related osteoporosis with current pathological fracture, other site, subsequent encounter for fracture with routine healing: Secondary | ICD-10-CM | POA: Diagnosis not present

## 2022-06-03 DIAGNOSIS — I4891 Unspecified atrial fibrillation: Secondary | ICD-10-CM | POA: Diagnosis not present

## 2022-06-03 DIAGNOSIS — I700 Atherosclerosis of aorta: Secondary | ICD-10-CM | POA: Diagnosis not present

## 2022-06-03 DIAGNOSIS — K7460 Unspecified cirrhosis of liver: Secondary | ICD-10-CM | POA: Diagnosis not present

## 2022-06-03 MED ORDER — TRAMADOL HCL 50 MG PO TABS: ORAL_TABLET | ORAL | 1 refills | Status: DC

## 2022-06-03 NOTE — Patient Outreach (Signed)
  Care Coordination     06/03/2022 Name: [name] MRN: [id number] DOB: May 19, 1935  [name] is a 87 y.o. year old female who sees Nestor Ramp, MD for primary care. I  collaborated with Medical illustrator who indicates the patient is in need of financial assistance for Ensure. SW initiated Path[employer] application to determine is patients health plan will assist with costs.  What matters to the patients health and wellness today?  SW did not speak with the patient today.    Goals Addressed             This Visit's Progress    Care Coordination Activities       Care Coordination Interventions: Collaboration with RN Care Manager [name] who indicates she was contacted by patients primary care provider requesting assistance with Ensure costs Initiation of [employer] application for Ensure; sent to providers office for completion SW will continue to follow to assist with application process and determine patients eligibility for assistance         SDOH assessments and interventions completed:  No     Care Coordination Interventions:  Yes, provided   Interventions Today    Flowsheet Row Most Recent Value  Chronic Disease   Chronic disease during today's visit Hypertension (HTN), Congestive Heart Failure (CHF)  General Interventions   General Interventions Discussed/Reviewed Communication with, Programme researcher, broadcasting/film/video for Ensure]  Communication with RN        Follow up plan:  SW will continue to follow    Encounter Outcome:  Pt. Visit Completed   Bevelyn Ngo, Kenard Gower, CDP Social Worker, Certified Dementia Practitioner [facility] Care Management  Care Coordination [phone]

## 2022-06-04 ENCOUNTER — Telehealth: Payer: Self-pay

## 2022-06-04 NOTE — Telephone Encounter (Signed)
A Prior Authorization was initiated for this patients TRAMADOL through CoverMyMeds.   Key: [id number]

## 2022-06-06 ENCOUNTER — Encounter: Payer: Self-pay | Admitting: Family Medicine

## 2022-06-06 DIAGNOSIS — E440 Moderate protein-calorie malnutrition: Secondary | ICD-10-CM | POA: Insufficient documentation

## 2022-06-06 NOTE — Telephone Encounter (Signed)
Prior Auth for patients medication TRAMADOL approved by [facility] MEDICARE PART D from 06/04/22 to 07/04/22.  CoverMyMeds Key: [id number] PA Case ID #: [id number]

## 2022-06-09 ENCOUNTER — Ambulatory Visit: Payer: Self-pay

## 2022-06-09 NOTE — Patient Instructions (Signed)
Visit Information  Thank you for taking time to visit with me today. Please don't hesitate to contact me if I can be of assistance to you.   Following are the goals we discussed today:  -Contact your primary care provider as needed   If you are experiencing a Mental Health or Behavioral Health Crisis or need someone to talk to, please go to [facility] [address], [place] [phone]) call 911  The patient verbalized understanding of instructions, educational materials, and care plan provided today and DECLINED offer to receive copy of patient instructions, educational materials, and care plan.   Bevelyn Ngo, BSW, CDP Social Worker, Certified Dementia Practitioner [facility]  Care Coordination [phone]

## 2022-06-09 NOTE — Patient Outreach (Signed)
  Care Coordination   Initial Visit Note   06/09/2022 Name: LARIA ZAMMIT MRN: [id number] DOB: 1935/06/03  TIMOLIN LININGER is a 87 y.o. year old female who sees Nestor Ramp, MD for primary care. I spoke with  [name] by phone today.  What matters to the patients health and wellness today?  Patient would like to determine if her health plan will cover the cost of Ensure Plus    Goals Addressed             This Visit's Progress    Care Coordination Activities   On track    Care Coordination Interventions: Received completed [employer] application from the patients Primary Care Providers office Contacted the patient to review application process and obtain verbal consent to submit the application to [employer] to determine if patients health plan will cover the costs of Ensure Plus Submitted application to [employer]           SDOH assessments and interventions completed:  No     Care Coordination Interventions:  Yes, provided   Interventions Today    Flowsheet Row Most Recent Value  Chronic Disease   Chronic disease during today's visit Hypertension (HTN), Congestive Heart Failure (CHF)  General Interventions   General Interventions Discussed/Reviewed General Interventions Discussed, Community Resources  Nutrition Interventions   Nutrition Discussed/Reviewed Supplmental nutrition        Follow up plan:  SW will continue to follow    Encounter Outcome:  Pt. Visit Completed   Bevelyn Ngo, Kenard Gower, CDP Social Worker, Certified Dementia Practitioner [facility] Care Management  Care Coordination [phone]

## 2022-06-09 NOTE — Telephone Encounter (Signed)
Entered in error

## 2022-06-10 DIAGNOSIS — G319 Degenerative disease of nervous system, unspecified: Secondary | ICD-10-CM | POA: Diagnosis not present

## 2022-06-10 DIAGNOSIS — K7460 Unspecified cirrhosis of liver: Secondary | ICD-10-CM | POA: Diagnosis not present

## 2022-06-10 DIAGNOSIS — Z9181 History of falling: Secondary | ICD-10-CM | POA: Diagnosis not present

## 2022-06-10 DIAGNOSIS — I700 Atherosclerosis of aorta: Secondary | ICD-10-CM | POA: Diagnosis not present

## 2022-06-10 DIAGNOSIS — M800AXD Age-related osteoporosis with current pathological fracture, other site, subsequent encounter for fracture with routine healing: Secondary | ICD-10-CM | POA: Diagnosis not present

## 2022-06-10 DIAGNOSIS — I4891 Unspecified atrial fibrillation: Secondary | ICD-10-CM | POA: Diagnosis not present

## 2022-06-12 DIAGNOSIS — N183 Chronic kidney disease, stage 3 unspecified: Secondary | ICD-10-CM | POA: Diagnosis not present

## 2022-06-12 DIAGNOSIS — R339 Retention of urine, unspecified: Secondary | ICD-10-CM | POA: Diagnosis not present

## 2022-06-12 DIAGNOSIS — I509 Heart failure, unspecified: Secondary | ICD-10-CM | POA: Diagnosis not present

## 2022-06-13 ENCOUNTER — Ambulatory Visit: Payer: 59

## 2022-06-17 ENCOUNTER — Ambulatory Visit: Payer: Self-pay

## 2022-06-17 DIAGNOSIS — I4891 Unspecified atrial fibrillation: Secondary | ICD-10-CM | POA: Diagnosis not present

## 2022-06-17 DIAGNOSIS — K7460 Unspecified cirrhosis of liver: Secondary | ICD-10-CM | POA: Diagnosis not present

## 2022-06-17 DIAGNOSIS — I700 Atherosclerosis of aorta: Secondary | ICD-10-CM | POA: Diagnosis not present

## 2022-06-17 DIAGNOSIS — G319 Degenerative disease of nervous system, unspecified: Secondary | ICD-10-CM | POA: Diagnosis not present

## 2022-06-17 DIAGNOSIS — Z9181 History of falling: Secondary | ICD-10-CM | POA: Diagnosis not present

## 2022-06-17 DIAGNOSIS — M800AXD Age-related osteoporosis with current pathological fracture, other site, subsequent encounter for fracture with routine healing: Secondary | ICD-10-CM | POA: Diagnosis not present

## 2022-06-17 NOTE — Patient Outreach (Signed)
  Care Coordination   Follow Up Visit Note   06/17/2022 Name: [name] MRN: [id number] DOB: 09-13-1935  [name] is a 87 y.o. year old female who sees Nestor Ramp, MD for primary care. I spoke with  [name] by phone today.  What matters to the patients health and wellness today?  The patient would like assistance with the cost of Ensure    Goals Addressed             This Visit's Progress    COMPLETED: Care Coordination Activities       Care Coordination Interventions: Communication with [employer] who advises the patients health plan does not cover the cost of Ensure unless it is for tube feedings therefore patient does not qualify for the program Contacted the patient to advise on outcome of her application; Discussed the patient is to drink two Ensure cans twice a week since she is on blood thinner Mailed the patient Ensure coupons to assist with cost savings Determined the patient has yet to receive the walker or cane her provider ordered earlier this month Advised the patient SW would communicate with her providers office requesting a team member follow up on the status of this order; patient is aware she will be contacted by the DME agency to arrange delivery. Patient encouraged to call her providers office if she does not receive DME within the next two weeks Encouraged the patient to contact her primary care providers office as needed          SDOH assessments and interventions completed:  No     Care Coordination Interventions:  Yes, provided   Interventions Today    Flowsheet Row Most Recent Value  Chronic Disease   Chronic disease during today's visit Hypertension (HTN), Congestive Heart Failure (CHF)  General Interventions   General Interventions Discussed/Reviewed General Interventions Reviewed, Communication with  Communication with PCP/Specialists  Nutrition Interventions   Nutrition Discussed/Reviewed Supplemental nutrition   [provided coupons to assist with Ensure costs]       Follow up plan: No further intervention required.   Encounter Outcome:  Pt. Visit Completed   Bevelyn Ngo, BSW, CDP Social Worker, Certified Dementia Practitioner [facility] Care Management  Care Coordination [phone]

## 2022-06-17 NOTE — Patient Instructions (Signed)
Visit Information  Thank you for taking time to visit with me today. Please don't hesitate to contact me if I can be of assistance to you.   Following are the goals we discussed today:  - Contact your primary care provider to follow up on DME orders as needed -Utilize provided Ensure coupons for cost savings  If you are experiencing a Mental Health or Behavioral Health Crisis or need someone to talk to, please go to [facility] [address] [place] [phone]) call 911  The patient verbalized understanding of instructions, educational materials, and care plan provided today and DECLINED offer to receive copy of patient instructions, educational materials, and care plan.   No further follow up required: Please contact me as needed.  Bevelyn Ngo, BSW, CDP Social Worker, Certified Dementia Practitioner [facility] Care Management  Care Coordination [phone]

## 2022-06-20 ENCOUNTER — Ambulatory Visit: Payer: 59 | Attending: Internal Medicine

## 2022-06-20 DIAGNOSIS — Z952 Presence of prosthetic heart valve: Secondary | ICD-10-CM

## 2022-06-20 DIAGNOSIS — Z5181 Encounter for therapeutic drug level monitoring: Secondary | ICD-10-CM | POA: Diagnosis not present

## 2022-06-20 LAB — POCT INR: INR: 2.8 (ref 2.0–3.0)

## 2022-06-20 NOTE — Patient Instructions (Signed)
continue taking Warfarin 1/2 tablet (5mg ) daily. Recheck INR in 3 weeks. remain  consistent with your 2 serving of greens per week and your 2 ensures a week. [facility] [phone] or [phone]

## 2022-06-23 ENCOUNTER — Telehealth: Payer: Self-pay

## 2022-06-23 NOTE — Telephone Encounter (Signed)
Patient calls nurse line requesting update on DME orders for [facility] and rollator.   Will send message to [employer] to check status.   Veronda Prude, RN

## 2022-06-24 ENCOUNTER — Telehealth: Payer: Self-pay

## 2022-06-25 ENCOUNTER — Ambulatory Visit: Payer: 59 | Admitting: Podiatry

## 2022-06-25 NOTE — Patient Outreach (Signed)
  Care Coordination   Initial Visit Note   06/24/2022 Name: SKYLAN SAMPAT MRN: [id number] DOB: 13-Aug-1935  MARCILLA STABER is a 87 y.o. year old female who sees Nestor Ramp, MD for primary care. I spoke with  [name] by phone today.  What matters to the patients health and wellness today?  I had a conversation with [name]. She mentioned that she lives alone and is managing well. She asked for a couple of DME orders, and I was able to verify on the computer that the orders had been placed. We also talked about some of her health issues, and I will be sending her some information for her to go through.    Goals Addressed             This Visit's Progress    I want my health maintained       Patient wants help to understand her conditions to monitor them       Patient Goals/Self Care Activities: -Patient/Caregiver will self-administer medications as prescribed as evidenced by self-report/primary caregiver report  -Patient/Caregiver will attend all scheduled provider appointments as evidenced by clinician review of documented attendance to scheduled appointments and patient/caregiver report -Patient/Caregiver will call pharmacy for medication refills as evidenced by patient report and review of pharmacy fill history as appropriate -Patient/Caregiver will call provider office for new concerns or questions as evidenced by review of documented incoming telephone call notes and patient report -Patient/Caregiver verbalizes understanding of plan -Patient/Caregiver will focus on medication adherence by taking medications as prescribed  -Calls provider office for new concerns, questions, or BP outside discussed parameters -Checks BP and records as discussed -Follows a low sodium diet/DASH diet -Weigh daily and record (notify MD with 3 lb weight gain over night or 5 lb in a week) -Follow CHF Action Plan -Adhere to low sodium diet  -Learn to check your pulse and write down the number  every day. -Record your zone each day and know your atrial fibrillation (A-fib) Action Plan          SDOH assessments and interventions completed:  Yes  SDOH Interventions Today    Flowsheet Row Most Recent Value  SDOH Interventions   Food Insecurity Interventions Intervention Not Indicated  Transportation Interventions Intervention Not Indicated        Care Coordination Interventions:  Yes, provided   Interventions Today    Flowsheet Row Most Recent Value  Chronic Disease   Chronic disease during today's visit Atrial Fibrillation (AFib), Hypertension (HTN), Congestive Heart Failure (CHF)  General Interventions   General Interventions Discussed/Reviewed General Interventions Discussed  Education Interventions   Education Provided Provided Education  Provided Verbal Education On Nutrition  Safety Interventions   Safety Discussed/Reviewed Safety Reviewed, Safety Discussed        Follow up plan: Follow up call scheduled for 07/08/22  1130 am    Encounter Outcome:  Pt. Visit Completed   [name] RN, BSN, [facility] Care Coordinator [facility]   Phone: [phone]

## 2022-06-25 NOTE — Patient Instructions (Signed)
Visit Information  Thank you for taking time to visit with me today. Please don't hesitate to contact me if I can be of assistance to you.   Following are the goals we discussed today:   Goals Addressed             This Visit's Progress    I want my health maintained       Patient wants help to understand her conditions to monitor them       Patient Goals/Self Care Activities: -Patient/Caregiver will self-administer medications as prescribed as evidenced by self-report/primary caregiver report  -Patient/Caregiver will attend all scheduled provider appointments as evidenced by clinician review of documented attendance to scheduled appointments and patient/caregiver report -Patient/Caregiver will call pharmacy for medication refills as evidenced by patient report and review of pharmacy fill history as appropriate -Patient/Caregiver will call provider office for new concerns or questions as evidenced by review of documented incoming telephone call notes and patient report -Patient/Caregiver verbalizes understanding of plan -Patient/Caregiver will focus on medication adherence by taking medications as prescribed  -Calls provider office for new concerns, questions, or BP outside discussed parameters -Checks BP and records as discussed -Follows a low sodium diet/DASH diet -Weigh daily and record (notify MD with 3 lb weight gain over night or 5 lb in a week) -Follow CHF Action Plan -Adhere to low sodium diet  -Learn to check your pulse and write down the number every day. -Record your zone each day and know your atrial fibrillation (A-fib) Action Plan          Our next appointment is by telephone on 07/08/22 at 1130  Please call the care guide team at [phone] if you need to cancel or reschedule your appointment.   If you are experiencing a Mental Health or Behavioral Health Crisis or need someone to talk to, please call 1-800-273-TALK (toll free, 24 hour hotline)   The patient  verbalized understanding of instructions, educational materials, and care plan provided today.    [name] RN, BSN, [facility] Care Coordinator [facility]   Phone: [phone]

## 2022-06-26 ENCOUNTER — Other Ambulatory Visit: Payer: Self-pay

## 2022-06-26 MED ORDER — METOPROLOL SUCCINATE ER 100 MG PO TB24: 100.0000 mg | ORAL_TABLET | Freq: Every evening | ORAL | 3 refills | Status: DC

## 2022-06-26 NOTE — Telephone Encounter (Signed)
Pt called asking about the refill of her metoprolol. While looking at the order it was marked as no print. Please resend Rx if appropriate.  Sunday Spillers, CMA

## 2022-06-26 NOTE — Telephone Encounter (Signed)
Pended Rx. Please fill in Qty and number of refills.  Sunday Spillers, CMA

## 2022-06-29 NOTE — Patient Instructions (Incomplete)
[name] , Thank you for taking time to come for your Medicare Wellness Visit. I appreciate your ongoing commitment to your health goals. Please review the following plan we discussed and let me know if I can assist you in the future.   These are the goals we discussed:  Goals       Blood Pressure < 140/90      Exercise 4-5x a week (pt-stated)      Good job walking 3x per week 30 minutes a time.  Lets try to increase to 4-5x per week 30 minutes each time.       Remain active and independent        This is a list of the screening recommended for you and due dates:  Health Maintenance  Topic Date Due   COVID-19 Vaccine (1) Never done   DTaP/Tdap/Td vaccine (3 - Tdap) 08/27/2017   Flu Shot  08/21/2022   Medicare Annual Wellness Visit  06/30/2023   Pneumonia Vaccine  Completed   DEXA scan (bone density measurement)  Completed   Zoster (Shingles) Vaccine  Completed   HPV Vaccine  Aged Out    Advanced directives: We have a copy of your advanced directives available in your record should your provider ever need to access them.   Conditions/risks identified: Aim for 30 minutes of exercise or brisk walking, 6-8 glasses of water, and 5 servings of fruits and vegetables each day.  Next appointment: Follow up in one year for your annual wellness visit   The number for [facility] Transportation services is [phone]   Preventive Care 65 Years and Older, Female Preventive care refers to lifestyle choices and visits with your health care provider that can promote health and wellness. What does preventive care include? A yearly physical exam. This is also called an annual well check. Dental exams once or twice a year. Routine eye exams. Ask your health care provider how often you should have your eyes checked. Personal lifestyle choices, including: Daily care of your teeth and gums. Regular physical activity. Eating a healthy diet. Avoiding tobacco and drug use. Limiting alcohol  use. Practicing safe sex. Taking low-dose aspirin every day. Taking vitamin and mineral supplements as recommended by your health care provider. What happens during an annual well check? The services and screenings done by your health care provider during your annual well check will depend on your age, overall health, lifestyle risk factors, and family history of disease. Counseling  Your health care provider may ask you questions about your: Alcohol use. Tobacco use. Drug use. Emotional well-being. Home and relationship well-being. Sexual activity. Eating habits. History of falls. Memory and ability to understand (cognition). Work and work Astronomer. Reproductive health. Screening  You may have the following tests or measurements: Height, weight, and BMI. Blood pressure. Lipid and cholesterol levels. These may be checked every 5 years, or more frequently if you are over 64 years old. Skin check. Lung cancer screening. You may have this screening every year starting at age 42 if you have a 30-pack-year history of smoking and currently smoke or have quit within the past 15 years. Fecal occult blood test (FOBT) of the stool. You may have this test every year starting at age 70. Flexible sigmoidoscopy or colonoscopy. You may have a sigmoidoscopy every 5 years or a colonoscopy every 10 years starting at age 82. Hepatitis C blood test. Hepatitis B blood test. Sexually transmitted disease (STD) testing. Diabetes screening. This is done by checking your blood  sugar (glucose) after you have not eaten for a while (fasting). You may have this done every 1-3 years. Bone density scan. This is done to screen for osteoporosis. You may have this done starting at age 52. Mammogram. This may be done every 1-2 years. Talk to your health care provider about how often you should have regular mammograms. Talk with your health care provider about your test results, treatment options, and if necessary,  the need for more tests. Vaccines  Your health care provider may recommend certain vaccines, such as: Influenza vaccine. This is recommended every year. Tetanus, diphtheria, and acellular pertussis (Tdap, Td) vaccine. You may need a Td booster every 10 years. Zoster vaccine. You may need this after age 54. Pneumococcal 13-valent conjugate (PCV13) vaccine. One dose is recommended after age 43. Pneumococcal polysaccharide (PPSV23) vaccine. One dose is recommended after age 28. Talk to your health care provider about which screenings and vaccines you need and how often you need them. This information is not intended to replace advice given to you by your health care provider. Make sure you discuss any questions you have with your health care provider. Document Released: 02/02/2015 Document Revised: 09/26/2015 Document Reviewed: 11/07/2014 Elsevier Interactive Patient Education  2017 [employer].  Fall Prevention in the Home Falls can cause injuries. They can happen to people of all ages. There are many things you can do to make your home safe and to help prevent falls. What can I do on the outside of my home? Regularly fix the edges of walkways and driveways and fix any cracks. Remove anything that might make you trip as you walk through a door, such as a raised step or threshold. Trim any bushes or trees on the path to your home. Use bright outdoor lighting. Clear any walking paths of anything that might make someone trip, such as rocks or tools. Regularly check to see if handrails are loose or broken. Make sure that both sides of any steps have handrails. Any raised decks and porches should have guardrails on the edges. Have any leaves, snow, or ice cleared regularly. Use sand or salt on walking paths during winter. Clean up any spills in your garage right away. This includes oil or grease spills. What can I do in the bathroom? Use night lights. Install grab bars by the toilet and in the  tub and shower. Do not use towel bars as grab bars. Use non-skid mats or decals in the tub or shower. If you need to sit down in the shower, use a plastic, non-slip stool. Keep the floor dry. Clean up any water that spills on the floor as soon as it happens. Remove soap buildup in the tub or shower regularly. Attach bath mats securely with double-sided non-slip rug tape. Do not have throw rugs and other things on the floor that can make you trip. What can I do in the bedroom? Use night lights. Make sure that you have a light by your bed that is easy to reach. Do not use any sheets or blankets that are too big for your bed. They should not hang down onto the floor. Have a firm chair that has side arms. You can use this for support while you get dressed. Do not have throw rugs and other things on the floor that can make you trip. What can I do in the kitchen? Clean up any spills right away. Avoid walking on wet floors. Keep items that you use a lot in easy-to-reach  places. If you need to reach something above you, use a strong step stool that has a grab bar. Keep electrical cords out of the way. Do not use floor polish or wax that makes floors slippery. If you must use wax, use non-skid floor wax. Do not have throw rugs and other things on the floor that can make you trip. What can I do with my stairs? Do not leave any items on the stairs. Make sure that there are handrails on both sides of the stairs and use them. Fix handrails that are broken or loose. Make sure that handrails are as long as the stairways. Check any carpeting to make sure that it is firmly attached to the stairs. Fix any carpet that is loose or worn. Avoid having throw rugs at the top or bottom of the stairs. If you do have throw rugs, attach them to the floor with carpet tape. Make sure that you have a light switch at the top of the stairs and the bottom of the stairs. If you do not have them, ask someone to add them for  you. What else can I do to help prevent falls? Wear shoes that: Do not have high heels. Have rubber bottoms. Are comfortable and fit you well. Are closed at the toe. Do not wear sandals. If you use a stepladder: Make sure that it is fully opened. Do not climb a closed stepladder. Make sure that both sides of the stepladder are locked into place. Ask someone to hold it for you, if possible. Clearly mark and make sure that you can see: Any grab bars or handrails. First and last steps. Where the edge of each step is. Use tools that help you move around (mobility aids) if they are needed. These include: Canes. Walkers. Scooters. Crutches. Turn on the lights when you go into a dark area. Replace any light bulbs as soon as they burn out. Set up your furniture so you have a clear path. Avoid moving your furniture around. If any of your floors are uneven, fix them. If there are any pets around you, be aware of where they are. Review your medicines with your doctor. Some medicines can make you feel dizzy. This can increase your chance of falling. Ask your doctor what other things that you can do to help prevent falls. This information is not intended to replace advice given to you by your health care provider. Make sure you discuss any questions you have with your health care provider. Document Released: 11/02/2008 Document Revised: 06/14/2015 Document Reviewed: 02/10/2014 Elsevier Interactive Patient Education  2017 [employer].

## 2022-06-29 NOTE — Progress Notes (Unsigned)
Subjective:   [name] is a 87 y.o. female who presents for Medicare Annual (Subsequent) preventive examination.  Review of Systems    ***       Objective:    There were no vitals filed for this visit. There is no height or weight on file to calculate BMI.     05/11/2022    4:53 AM 05/10/2022    8:01 PM 04/04/2022    3:27 PM 09/26/2021    1:36 PM 09/18/2021   11:16 AM 04/08/2021   11:30 AM 03/27/2021   11:00 AM  Advanced Directives  Does Patient Have a Medical Advance Directive?  No No No No No No  Would patient like information on creating a medical advance directive? No - Patient declined     Yes (MAU/Ambulatory/Procedural Areas - Information given) No - Patient declined    Current Medications (verified) Outpatient Encounter Medications as of 06/30/2022  Medication Sig   acetaminophen (TYLENOL) 500 MG tablet Take 2 tablets (1,000 mg total) by mouth every 6 (six) hours. (Patient not taking: Reported on 06/24/2022)   alendronate (FOSAMAX) 70 MG tablet Take with a full glass of water on an empty stomach.TAKE EVERY TUESDAY (Patient not taking: Reported on 06/24/2022)   Cholecalciferol (VITAMIN D) 2000 units tablet Take 1 tablet (2,000 Units total) by mouth daily.   dorzolamidel-timolol (COSOPT) 22.3-6.8 MG/ML SOLN ophthalmic solution Place 1 drop into both eyes in the morning and at bedtime.   FIBER PO Take 1 tablet by mouth daily as needed (Only for diarrhea).   furosemide (LASIX) 40 MG tablet Take 1 tablet (40 mg total) by mouth daily.   hydrALAZINE (APRESOLINE) 10 MG tablet Take 1 tablet (10 mg total) by mouth 3 (three) times daily.   isosorbide mononitrate (IMDUR) 30 MG 24 hr tablet TAKE 1/2 TABLET BY MOUTH DAILY   LUMIGAN 0.01 % SOLN Place 1 drop into both eyes at bedtime.   metoprolol succinate (TOPROL-XL) 100 MG 24 hr tablet Take 1 tablet (100 mg total) by mouth every evening.   pantoprazole (PROTONIX) 40 MG tablet TAKE 1 TABLET(40 MG) BY MOUTH DAILY (Patient taking  differently: Take 40 mg by mouth daily.)   polyethylene glycol (MIRALAX / GLYCOLAX) 17 g packet Take 17 g by mouth daily as needed for mild constipation. (Patient not taking: Reported on 06/24/2022)   senna-docusate (SENOKOT-S) 8.6-50 MG tablet Take 1 tablet by mouth 2 (two) times daily. (Patient not taking: Reported on 06/24/2022)   tamsulosin (FLOMAX) 0.4 MG CAPS capsule TAKE 1 CAPSULE(0.4 MG) BY MOUTH DAILY (Patient taking differently: Take 0.4 mg by mouth daily.)   traMADol (ULTRAM) 50 MG tablet TAKE ONE BY MOUTH ONCE OR TWICE A DAY AS NEEDED FOR SEVERE PAIN WITH MAX DOSE 2 TABS A DAY   warfarin (COUMADIN) 5 MG tablet Take 1 tablet (5 mg total) by mouth daily. (Patient taking differently: Take 5 mg by mouth daily.)   No facility-administered encounter medications on file as of 06/30/2022.    Allergies (verified) Iodinated contrast media and Esomeprazole magnesium   History: Past Medical History:  Diagnosis Date   Asthma    years ago   Chronic atrial fibrillation (HCC)    Dilated cardiomyopathy (HCC)    history of this, now resolved   Dysphagia    Hx   Fracture of tibial plateau 05/21/2015   GERD (gastroesophageal reflux disease)    GI bleed    Hemorrhoid 04/2014   bleeding   History of blood transfusion  Hypertension    Microcytic anemia    Nonsustained ventricular tachycardia (HCC)    Osteoporosis    Protein in urine    RBBB (right bundle branch block)    Rheumatic mitral valve disease    Past Surgical History:  Procedure Laterality Date   CHOLECYSTECTOMY  2005   COLONOSCOPY     COLONOSCOPY W/ POLYPECTOMY     EYE SURGERY  04/2014   HEMICOLECTOMY  2008   forpolyps   MITOMYCIN C APPLICATION Right 05/17/2014   Procedure: MITOMYCIN C APPLICATION;  Surgeon: Chalmers Guest, MD;  Location: [facility] OR;  Service: Ophthalmology;  Laterality: Right;   MITRAL VALVE REPLACEMENT  1987   St Jude mechanical valve   RIGHT/LEFT HEART CATH AND CORONARY ANGIOGRAPHY N/A 04/02/2021   Procedure:  RIGHT/LEFT HEART CATH AND CORONARY ANGIOGRAPHY;  Surgeon: Dolores Patty, MD;  Location: [facility] INVASIVE CV LAB;  Service: Cardiovascular;  Laterality: N/A;   TOTAL HIP ARTHROPLASTY Right 04/08/2022   Procedure: TOTAL HIP ARTHROPLASTY;  Surgeon: Sheral Apley, MD;  Location: [facility] OR;  Service: Orthopedics;  Laterality: Right;   TRABECULECTOMY Right 05/17/2014   Procedure: TRABECULECTOMY WITH MITOMYCIN RIGHT EYE;  Surgeon: Chalmers Guest, MD;  Location: [facility] OR;  Service: Ophthalmology;  Laterality: Right;   TUBAL LIGATION     US ECHOCARDIOGRAPHY  02/2008, 03/2006, 06/2004   Family History  Problem Relation Age of Onset   Cancer Father        prostate cancer   Hypertension Mother    Stroke Mother        CVA x 2   Cancer Sister        in the Bladder   Endometriosis Daughter    Stroke Sister    Blindness Sister    Hypertension Sister    Thyroid disease Daughter    Hypertension Daughter    Hypertension Daughter    Hypertension Daughter    Liver disease Daughter    Colon cancer Neg Hx    Breast cancer Neg Hx    Social History   Socioeconomic History   Marital status: Divorced    Spouse name: Not on file   Number of children: 5   Years of education: 12   Highest education level: High school graduate  Occupational History   Occupation: Retired  Tobacco Use   Smoking status: Former    Packs/day: 1.50    Years: 15.00    Additional pack years: 0.00    Total pack years: 22.50    Types: Cigarettes    Quit date: 1987    Years since quitting: 37.4   Smokeless tobacco: Never   Tobacco comments:    quit 1987  Vaping Use   Vaping Use: Never used  Substance and Sexual Activity   Alcohol use: No   Drug use: No   Sexual activity: Not Currently  Other Topics Concern   Not on file  Social History Narrative   Patient lives alone in [place].   Patient has 3 children who live near her and offer support. 2 live up [place].   Patient is active in [place] and enjoys walking for exercise.     Social Determinants of Health   Financial Resource Strain: Low Risk  (10/07/2018)   Overall Financial Resource Strain (CARDIA)    Difficulty of Paying Living Expenses: Not hard at all  Food Insecurity: No Food Insecurity (05/11/2022)   Hunger Vital Sign    Worried About Running Out of Food in the Last Year: Never true  Ran Out of Food in the Last Year: Never true  Transportation Needs: No Transportation Needs (05/11/2022)   PRAPARE - Administrator, Civil Service (Medical): No    Lack of Transportation (Non-Medical): No  Physical Activity: Insufficiently Active (10/07/2018)   Exercise Vital Sign    Days of Exercise per Week: 3 days    Minutes of Exercise per Session: 30 min  Stress: No Stress Concern Present (10/07/2018)   [employer] of Occupational Health - Occupational Stress Questionnaire    Feeling of Stress : Not at all  Social Connections: Moderately Isolated (10/07/2018)   Social Connection and Isolation Panel [NHANES]    Frequency of Communication with Friends and Family: Three times a week    Frequency of Social Gatherings with Friends and Family: Three times a week    Attends Religious Services: 1 to 4 times per year    Active Member of Clubs or Organizations: No    Attends Banker Meetings: Never    Marital Status: Divorced    Tobacco Counseling Counseling given: Not Answered Tobacco comments: quit 1987   Clinical Intake:                 Diabetic?No          Activities of Daily Living    05/11/2022    3:00 AM  In your present state of health, do you have any difficulty performing the following activities:  Hearing? 1  Vision? 0  Difficulty concentrating or making decisions? 1  Comment sometimes  Walking or climbing stairs? 1  Dressing or bathing? 0  Doing errands, shopping? 1    Patient Care Team: Nestor Ramp, MD as PCP - General (Family Medicine) Marinus Maw, MD as PCP - Electrophysiology  (Cardiology) Marinus Maw, MD as PCP - Cardiology (Cardiology) Willis Modena, MD as Consulting Physician (Gastroenterology)  Indicate any recent Medical Services you may have received from other than [facility] providers in the past year (date may be approximate).     Assessment:   This is a routine wellness examination for [name].  Hearing/Vision screen No results found.  Dietary issues and exercise activities discussed:     Goals Addressed   None    Depression Screen    05/28/2022    9:46 AM 09/26/2021    1:35 PM 04/08/2021   11:12 AM 07/20/2019   11:45 AM 10/07/2018    1:50 PM 06/10/2017   11:32 AM 09/17/2016   10:43 AM  PHQ 2/9 Scores  PHQ - 2 Score 0 0 1 0 0 0 0  PHQ- 9 Score 0 0 4        Fall Risk    06/24/2022   11:16 AM 05/28/2022    9:43 AM 09/26/2021    1:35 PM 04/08/2021   11:29 AM 07/20/2019   11:45 AM  Fall Risk   Falls in the past year? 1 1 0 0 0  Number falls in past yr: 1 1 0    Injury with Fall?   0      FALL RISK PREVENTION PERTAINING TO THE HOME:  Any stairs in or around the home? {YES/NO:21197} If so, are there any without handrails? {YES/NO:21197} Home free of loose throw rugs in walkways, pet beds, electrical cords, etc? {YES/NO:21197} Adequate lighting in your home to reduce risk of falls? {YES/NO:21197}  ASSISTIVE DEVICES UTILIZED TO PREVENT FALLS:  Life alert? {YES/NO:21197} Use of a cane, walker or w/c? {YES/NO:21197} Grab bars in  the bathroom? {YES/NO:21197} Shower chair or bench in shower? {YES/NO:21197} Elevated toilet seat or a handicapped toilet? {YES/NO:21197}  TIMED UP AND GO:  Was the test performed? No . Telephonic visit   Cognitive Function:        10/07/2018    1:52 PM  6CIT Screen  What Year? 0 points  What month? 0 points  What time? 0 points  Count back from 20 0 points  Months in reverse 2 points  Repeat phrase 0 points  Total Score 2 points    Immunizations Immunization History  Administered Date(s)  Administered   Influenza Split 10/25/2008, 11/14/2011, 11/16/2012, 10/31/2013   Influenza, High Dose Seasonal PF 10/09/2017, 10/04/2018   Influenza,inj,Quad PF,6+ Mos 10/14/2010, 10/25/2013, 10/24/2015   Influenza-Unspecified 10/04/2018   Pneumococcal Conjugate-13 03/22/2014, 05/06/2016   Pneumococcal Polysaccharide-23 09/24/2002, 08/28/2006   Td 07/20/2001, 08/28/2007   Zoster Recombinat (Shingrix) 05/28/2017   Zoster, Live 03/22/2014    {TDAP status:2101805}  Pneumococcal vaccine status: Up to date  Covid-19 vaccine status: Information provided on how to obtain vaccines.   Qualifies for Shingles Vaccine? Yes   Zostavax completed No   Shingrix Completed?: No.    Education has been provided regarding the importance of this vaccine. Patient has been advised to call insurance company to determine out of pocket expense if they have not yet received this vaccine. Advised may also receive vaccine at local pharmacy or Health Dept. Verbalized acceptance and understanding.  Screening Tests Health Maintenance  Topic Date Due   COVID-19 Vaccine (1) Never done   Zoster Vaccines- Shingrix (2 of 2) 07/23/2017   DTaP/Tdap/Td (3 - Tdap) 08/27/2017   Medicare Annual Wellness (AWV)  10/07/2019   INFLUENZA VACCINE  08/21/2022   Pneumonia Vaccine 86+ Years old  Completed   DEXA SCAN  Completed   HPV VACCINES  Aged Out    Health Maintenance  Health Maintenance Due  Topic Date Due   COVID-19 Vaccine (1) Never done   Zoster Vaccines- Shingrix (2 of 2) 07/23/2017   DTaP/Tdap/Td (3 - Tdap) 08/27/2017   Medicare Annual Wellness (AWV)  10/07/2019    Colorectal cancer screening: No longer required.   Mammogram status: No longer required due to age and preference .  Bone Density status: Completed 07/15/12. Results reflect: Bone density results: OSTEOPOROSIS. Repeat every 2 years.  Lung Cancer Screening: (Low Dose CT Chest recommended if Age 61-80 years, 30 pack-year currently smoking OR have  quit w/in 15years.) does not qualify.   Lung Cancer Screening Referral: n/a  Additional Screening:  Hepatitis C Screening: does not qualify  Vision Screening: Recommended annual ophthalmology exams for early detection of glaucoma and other disorders of the eye. Is the patient up to date with their annual eye exam?  {YES/NO:21197} Who is the provider or what is the name of the office in which the patient attends annual eye exams? *** If pt is not established with a provider, would they like to be referred to a provider to establish care? {YES/NO:21197}.   Dental Screening: Recommended annual dental exams for proper oral hygiene  Community Resource Referral / Chronic Care Management: CRR required this visit?  {YES/NO:21197}  CCM required this visit?  {YES/NO:21197}     Plan:     I have personally reviewed and noted the following in the patient's chart:   Medical and social history Use of alcohol, tobacco or illicit drugs  Current medications and supplements including opioid prescriptions. {Opioid Prescriptions:7064206567} Functional ability and status Nutritional status Physical activity Advanced  directives List of other physicians Hospitalizations, surgeries, and ER visits in previous 12 months Vitals Screenings to include cognitive, depression, and falls Referrals and appointments  In addition, I have reviewed and discussed with patient certain preventive protocols, quality metrics, and best practice recommendations. A written personalized care plan for preventive services as well as general preventive health recommendations were provided to patient.     Durwin Nora, [place]   01/25/1094   Due to this being a virtual visit, the after visit summary with patients personalized plan was offered to patient via mail or my-chart. ***Patient declined at this time./ Patient would like to access on my-chart/ per request, patient was mailed a copy of AVS./ Patient preferred  to pick up at office at next visit  Nurse Notes: ***

## 2022-06-30 ENCOUNTER — Ambulatory Visit (INDEPENDENT_AMBULATORY_CARE_PROVIDER_SITE_OTHER): Payer: 59

## 2022-06-30 VITALS — Ht 68.0 in | Wt 150.0 lb

## 2022-06-30 DIAGNOSIS — Z0000 Encounter for general adult medical examination without abnormal findings: Secondary | ICD-10-CM

## 2022-07-05 ENCOUNTER — Other Ambulatory Visit: Payer: Self-pay | Admitting: Family Medicine

## 2022-07-07 ENCOUNTER — Other Ambulatory Visit: Payer: Self-pay | Admitting: Family Medicine

## 2022-07-07 MED ORDER — FUROSEMIDE 40 MG PO TABS: 40.0000 mg | ORAL_TABLET | Freq: Two times a day (BID) | ORAL | 3 refills | Status: DC

## 2022-07-07 NOTE — Progress Notes (Signed)
Reviewing notes from [facility]: Dr. Kathrene Bongo changed her lasix to BID. I have updated EPIC and sent in a new rx so she will have enough

## 2022-07-08 ENCOUNTER — Ambulatory Visit: Payer: Self-pay

## 2022-07-08 NOTE — Patient Outreach (Signed)
  Care Coordination   Follow Up Visit Note   07/08/2022 Name: [name] MRN: [id number] DOB: 1935-06-26  [name] is a 87 y.o. year old female who sees Nestor Ramp, MD for primary care. I spoke with  [name] by phone today.  What matters to the patients health and wellness today?  [name] is in good health and feeling strong, with no chest pain or swelling. Her medications are effective. She needs to buy batteries for the scales so she can start weighing herself daily. She is looking for information on her equipment orders with [employer]. I have already reached out to the nurses at the office to inquire about the order from [employer], and they should get in touch with me.    Goals Addressed             This Visit's Progress    I want to maintain my CHF       Patient Goals/Self Care Activities: -Patient/Caregiver will self-administer medications as prescribed as evidenced by self-report/primary caregiver report  -Patient/Caregiver will attend all scheduled provider appointments as evidenced by clinician review of documented attendance to scheduled appointments and patient/caregiver report -Patient/Caregiver will call pharmacy for medication refills as evidenced by patient report and review of pharmacy fill history as appropriate -Patient/Caregiver will call provider office for new concerns or questions as evidenced by review of documented incoming telephone call notes and patient report -Patient/Caregiver verbalizes understanding of plan -Patient/Caregiver will focus on medication adherence by taking medications as prescribed  -Weigh daily and record (notify MD with 3 lb weight gain over night or 5 lb in a week) -Follow CHF Action Plan -Adhere to low sodium diet         SDOH assessments and interventions completed:  No     Care Coordination Interventions:  Yes, provided   Follow up plan: Follow up call scheduled for 08/08/22  1130  am    Encounter Outcome:  Pt.  Visit Completed   [name] RN, BSN, [facility] Care Coordinator [facility]   Phone: [phone]

## 2022-07-08 NOTE — Patient Instructions (Signed)
Visit Information  Thank you for taking time to visit with me today. Please don't hesitate to contact me if I can be of assistance to you.   Following are the goals we discussed today:   Goals Addressed             This Visit's Progress    I want to maintain my CHF       Patient Goals/Self Care Activities: -Patient/Caregiver will self-administer medications as prescribed as evidenced by self-report/primary caregiver report  -Patient/Caregiver will attend all scheduled provider appointments as evidenced by clinician review of documented attendance to scheduled appointments and patient/caregiver report -Patient/Caregiver will call pharmacy for medication refills as evidenced by patient report and review of pharmacy fill history as appropriate -Patient/Caregiver will call provider office for new concerns or questions as evidenced by review of documented incoming telephone call notes and patient report -Patient/Caregiver verbalizes understanding of plan -Patient/Caregiver will focus on medication adherence by taking medications as prescribed  -Weigh daily and record (notify MD with 3 lb weight gain over night or 5 lb in a week) -Follow CHF Action Plan -Adhere to low sodium diet         Our next appointment is by telephone on 08/07/25 at 1130 am  Please call the care guide team at [phone] if you need to cancel or reschedule your appointment.   If you are experiencing a Mental Health or Behavioral Health Crisis or need someone to talk to, please call 1-800-273-TALK (toll free, 24 hour hotline)  The patient verbalized understanding of instructions, educational materials, and care plan provided today.   [name] RN, BSN, [facility] Care Coordinator [facility]   Phone: [phone]

## 2022-07-11 ENCOUNTER — Ambulatory Visit: Payer: 59 | Attending: Internal Medicine | Admitting: *Deleted

## 2022-07-11 DIAGNOSIS — Z5181 Encounter for therapeutic drug level monitoring: Secondary | ICD-10-CM

## 2022-07-11 DIAGNOSIS — Z952 Presence of prosthetic heart valve: Secondary | ICD-10-CM | POA: Diagnosis not present

## 2022-07-11 LAB — POCT INR: INR: 1.6 — AB (ref 2.0–3.0)

## 2022-07-11 NOTE — Patient Instructions (Addendum)
Description   Today take 1 tablet of warfarin and 1 tablet of tomorrow then continue taking Warfarin 1/2 tablet (5mg ) daily. Recheck INR in 3 weeks. Remain consistent with your 2 serving of greens per week and your 2 ensures a week. Coumadin Clinic [phone] or [phone].

## 2022-07-14 ENCOUNTER — Other Ambulatory Visit: Payer: Self-pay

## 2022-07-14 DIAGNOSIS — I482 Chronic atrial fibrillation, unspecified: Secondary | ICD-10-CM

## 2022-07-14 MED ORDER — WARFARIN SODIUM 5 MG PO TABS
ORAL_TABLET | ORAL | 1 refills | Status: DC
Start: 2022-07-14 — End: 2022-12-22

## 2022-07-14 NOTE — Telephone Encounter (Signed)
This encounter was created in error - please disregard.

## 2022-07-15 ENCOUNTER — Telehealth: Payer: Self-pay

## 2022-07-15 NOTE — Patient Outreach (Signed)
  Care Coordination   Follow Up Visit Note   07/15/2022 Name: [name] MRN: [id number] DOB: 1935/12/19  [name] is a 87 y.o. year old female who sees Nestor Ramp, MD for primary care. I spoke with  [name] by phone today.  What matters to the patients health and wellness today?  I spoke with [name] to inform her that she received her rollator in 2023. Unfortunately, her insurance will not cover the walker. Also, the 4-prong cane will have to be paid for out of pocket.      SDOH assessments and interventions completed:  No     Care Coordination Interventions:  Yes, provided   Interventions Today    Flowsheet Row Most Recent Value  General Interventions   General Interventions Discussed/Reviewed Durable Medical Equipment (DME), General Interventions Discussed  [Walker  4-prong cane]          Follow up plan:  next scheduled interval    Encounter Outcome:  Pt. Visit Completed   [name] RN, BSN, [facility] Care Coordinator [facility]   Phone: [phone]

## 2022-07-16 ENCOUNTER — Encounter: Payer: Self-pay | Admitting: Family Medicine

## 2022-07-16 ENCOUNTER — Ambulatory Visit (INDEPENDENT_AMBULATORY_CARE_PROVIDER_SITE_OTHER): Payer: 59 | Admitting: Family Medicine

## 2022-07-16 VITALS — BP 142/92 | HR 93 | Wt 146.6 lb

## 2022-07-16 DIAGNOSIS — I10 Essential (primary) hypertension: Secondary | ICD-10-CM

## 2022-07-16 DIAGNOSIS — N183 Chronic kidney disease, stage 3 unspecified: Secondary | ICD-10-CM

## 2022-07-16 DIAGNOSIS — R531 Weakness: Secondary | ICD-10-CM | POA: Diagnosis not present

## 2022-07-16 DIAGNOSIS — M810 Age-related osteoporosis without current pathological fracture: Secondary | ICD-10-CM

## 2022-07-16 NOTE — Patient Instructions (Signed)
Great to see you! Let me see you in 4-5 months! Please call in mean time with any issues!

## 2022-07-17 NOTE — Progress Notes (Signed)
    CHIEF COMPLAINT / HPI:   Here with her daughter [name] for follow-up of her chronic medical conditions 1.  Hypertension: Taking her medicines regularly.  Her nephrologist had increased her Lasix and she is having a lot more urinary output but otherwise is feeling pretty well with that increased dose.  Notably, she has history of urinary retention with no symptoms at this time. 2.  Chronic anticoagulation for valve replacement.  She is following with the anticoagulation clinic and is on warfarin.  No bleeding.  No problems with her medicines. 3.  Generalized weakness: Says she is eating pretty well and feels well although she notes that she does not have the strength that she used to have or the stamina.  We had attempted to get her some dietary supplements such as Ensure to make sure she was getting adequate protein but unfortunately she did not qualify for that she is trying to buy them over-the-counter and drinks about 1 or 2 a week.  PERTINENT  PMH / PSH: I have reviewed the patient's medications, allergies, past medical and surgical history, smoking status and updated in the EMR as appropriate.   OBJECTIVE:  BP (!) 142/92   Pulse 93   Wt 146 lb 9.6 oz (66.5 kg)   SpO2 98%   BMI 22.29 kg/m  GENERAL: Well-developed elderly female no acute distress CV: Irregularly irregular rhythm, 2 out of 6 systolic murmur heard left at the best left sternal border. LUNGS: No increased work of breathing. NEURO: Decreased hearing and she is not wearing her hearing aids today. MSK: Rises from a chair with minimal assistance from her daughter.  Short stride length and somewhat stooped posture with some mild instability.  ASSESSMENT / PLAN:   Weakness Weakness is multifactorial.  I do think she is eating better.  I encouraged her to continue to get as much Ensure per week as she can.  Once a day would be ideal.  If that is too expensive also consider other supplements such as Carnation instant breakfast  etc.  Hypertension Her goal blood pressure systolic less than 160.  She has a very large pulse pressure.  I would not change her regimen at this time.  She is significant fall risk and she is also on chronic anticoagulation.  CKD (chronic kidney disease), stage III (HCC) She continues to follow with nephrology.  They had increased her furosemide dose and she seems to be tolerating that well.  She has follow-up with them.   Denny Levy MD

## 2022-07-17 NOTE — Assessment & Plan Note (Signed)
Her goal blood pressure systolic less than 160.  She has a very large pulse pressure.  I would not change her regimen at this time.  She is significant fall risk and she is also on chronic anticoagulation.

## 2022-07-17 NOTE — Assessment & Plan Note (Signed)
Was started on bisphosphonate most recent hospitalization but she does not want to take that.  She has discontinued it.

## 2022-07-17 NOTE — Assessment & Plan Note (Signed)
Weakness is multifactorial.  I do think she is eating better.  I encouraged her to continue to get as much Ensure per week as she can.  Once a day would be ideal.  If that is too expensive also consider other supplements such as Carnation instant breakfast etc.

## 2022-07-17 NOTE — Assessment & Plan Note (Signed)
She continues to follow with nephrology.  They had increased her furosemide dose and she seems to be tolerating that well.  She has follow-up with them.

## 2022-08-01 ENCOUNTER — Ambulatory Visit: Payer: 59 | Attending: Internal Medicine

## 2022-08-01 DIAGNOSIS — Z952 Presence of prosthetic heart valve: Secondary | ICD-10-CM

## 2022-08-01 DIAGNOSIS — Z5181 Encounter for therapeutic drug level monitoring: Secondary | ICD-10-CM | POA: Diagnosis not present

## 2022-08-01 LAB — POCT INR: INR: 2.1 (ref 2.0–3.0)

## 2022-08-01 NOTE — Patient Instructions (Signed)
Description   Today take 2 tablets (10mg ) of warfarin and then START taking Warfarin 1 tablet (5mg ) daily except 1.5 tablets (7.5mg ) on Sundays.  Recheck INR in 2 weeks.  Remain consistent with your 2 serving of greens per week and your 2 ensures a week.  C[facility]madin Clinic [phone] or [phone].

## 2022-08-06 ENCOUNTER — Other Ambulatory Visit: Payer: Self-pay | Admitting: Gastroenterology

## 2022-08-06 DIAGNOSIS — K7460 Unspecified cirrhosis of liver: Secondary | ICD-10-CM

## 2022-08-07 ENCOUNTER — Other Ambulatory Visit (HOSPITAL_COMMUNITY): Payer: Self-pay

## 2022-08-15 ENCOUNTER — Ambulatory Visit: Payer: 59 | Attending: Internal Medicine | Admitting: Pharmacist

## 2022-08-15 DIAGNOSIS — Z952 Presence of prosthetic heart valve: Secondary | ICD-10-CM | POA: Diagnosis not present

## 2022-08-15 DIAGNOSIS — I482 Chronic atrial fibrillation, unspecified: Secondary | ICD-10-CM | POA: Diagnosis not present

## 2022-08-15 LAB — POCT INR: INR: 3.5 — AB (ref 2.0–3.0)

## 2022-08-15 NOTE — Patient Instructions (Signed)
Description   Continue Warfarin 1 tablet (5mg ) daily except 1.5 tablets (7.5mg ) on Sundays.  Recheck INR in 3 weeks.  Remain consistent with your 2 serving of greens per week and your 2 ensures a week.  Coumadin Clinic [phone] or [phone].

## 2022-08-19 ENCOUNTER — Ambulatory Visit: Payer: Self-pay

## 2022-08-19 ENCOUNTER — Ambulatory Visit: Payer: 59 | Admitting: Podiatry

## 2022-08-19 NOTE — Patient Instructions (Signed)
Visit Information  Thank you for taking time to visit with me today. Please don't hesitate to contact me if I can be of assistance to you.   Following are the goals we discussed today:   Goals Addressed             This Visit's Progress    I want to maintain my CHF       Patient Goals/Self Care Activities: -Patient/Caregiver will self-administer medications as prescribed as evidenced by self-report/primary caregiver report  -Patient/Caregiver will attend all scheduled provider appointments as evidenced by clinician review of documented attendance to scheduled appointments and patient/caregiver report -Patient/Caregiver will call pharmacy for medication refills as evidenced by patient report and review of pharmacy fill history as appropriate -Patient/Caregiver will call provider office for new concerns or questions as evidenced by review of documented incoming telephone call notes and patient report -Patient/Caregiver will focus on medication adherence by taking medications as prescribed  -Weigh daily and record (notify MD with 3 lb weight gain over night or 5 lb in a week) -Follow CHF Action Plan -Adhere to low sodium diet         Our next appointment is by telephone on 09/14/22 at 1130 am  Please call the care guide team at [phone] if you need to cancel or reschedule your appointment.   If you are experiencing a Mental Health or Behavioral Health Crisis or need someone to talk to, please call 1-800-273-TALK (toll free, 24 hour hotline)  The patient verbalized understanding of instructions, educational materials, and care plan provided today.   [name] RN, BSN, [facility] Care Coordinator [facility]   Phone: [phone]

## 2022-08-19 NOTE — Patient Outreach (Signed)
  Care Coordination   Follow Up Visit Note   08/19/2022 Name: [name] MRN: [id number] DOB: 1935/09/30  [name] is a 87 y.o. year old female who sees Tami Ramp, MD for primary care. I spoke with  [name] by phone today.  What matters to the patients health and wellness today?  [name] is currently in stable condition. She has been experiencing swelling in her right leg and is currently managing this with the use of compression stockings and a prescribed regimen of two fluid pills per day. She reports that she would like to stop the twwo pills a day. I have suggested that she maintain proper hydration and consult her nephrologist regarding her medication, specifically inquiring about the necessity of continuing with the current dosage of two pills daily. A recent assessment conducted by the [facility] nurse two weeks ago indicated that her blood pressure was within healthy parameters, and she denied any associated symptoms such as headache, swelling, chest pain, or dyspnea. Furthermore, she plans arranges an annual check-up appointment with the urology department.     Goals Addressed             This Visit's Progress    I want to maintain my CHF       Patient Goals/Self Care Activities: -Patient/Caregiver will self-administer medications as prescribed as evidenced by self-report/primary caregiver report  -Patient/Caregiver will attend all scheduled provider appointments as evidenced by clinician review of documented attendance to scheduled appointments and patient/caregiver report -Patient/Caregiver will call pharmacy for medication refills as evidenced by patient report and review of pharmacy fill history as appropriate -Patient/Caregiver will call provider office for new concerns or questions as evidenced by review of documented incoming telephone call notes and patient report -Patient/Caregiver will focus on medication adherence by taking medications as prescribed   -Weigh daily and record (notify MD with 3 lb weight gain over night or 5 lb in a week) -Follow CHF Action Plan -Adhere to low sodium diet         SDOH assessments and interventions completed:  No     Care Coordination Interventions:  Yes, provided   Interventions Today    Flowsheet Row Most Recent Value  Chronic Disease   Chronic disease during today's visit Congestive Heart Failure (CHF)  General Interventions   General Interventions Discussed/Reviewed General Interventions Discussed, General Interventions Reviewed  Nutrition Interventions   Nutrition Discussed/Reviewed Nutrition Discussed, Decreasing salt  Pharmacy Interventions   Pharmacy Dicussed/Reviewed Pharmacy Topics Discussed  Safety Interventions   Safety Discussed/Reviewed Safety Reviewed        Follow up plan: Follow up call scheduled for 09/14/22  1130 am    Encounter Outcome:  Pt. Visit Completed   [name] RN, BSN, [facility] Care Coordinator [facility]   Phone: [phone]

## 2022-08-20 ENCOUNTER — Telehealth: Payer: Self-pay

## 2022-08-20 NOTE — Patient Outreach (Signed)
  Care Coordination   Follow Up Visit Note   08/20/2022 Name: [name] MRN: [id number] DOB: 10-26-1935  [name] is a 87 y.o. year old female who sees Nestor Ramp, MD for primary care. I left  message for  [name] by phone today.  What matters to the patients health and wellness today?  I returned [name]'s call to acknowledge the receipt of her information. She is scheduled to meet with her nephrologist on September 09, 2022, at 8:40 a.m. During the appointment, she intends to consult the physician regarding the administration of the secondary diuretic medication.     SDOH assessments and interventions completed:  No     Care Coordination Interventions:  Yes, provided   Interventions Today    Flowsheet Row Most Recent Value  Chronic Disease   Chronic disease during today's visit Congestive Heart Failure (CHF)  General Interventions   General Interventions Discussed/Reviewed General Interventions Discussed  [Retured call to let her know i recieved her information.]        Follow up plan:  Next scheduled interval.    Encounter Outcome:  Pt. Visit Completed   [name] RN, BSN, [facility] Care Coordinator [facility]   Phone: [phone]

## 2022-08-22 ENCOUNTER — Other Ambulatory Visit: Payer: 59

## 2022-09-05 ENCOUNTER — Ambulatory Visit: Payer: 59 | Admitting: *Deleted

## 2022-09-05 DIAGNOSIS — Z952 Presence of prosthetic heart valve: Secondary | ICD-10-CM | POA: Diagnosis not present

## 2022-09-05 LAB — POCT INR: INR: 2.2 (ref 2.0–3.0)

## 2022-09-05 NOTE — Patient Instructions (Signed)
Description   Today take 1.5 tablets of warfarin then continue Warfarin 1 tablet (5mg ) daily except 1.5 tablets (7.5mg ) on Sundays. Recheck INR in 3 weeks.  Remain consistent with your 2 serving of greens per week and your 2 ensures a week.  [facility] Clinic [phone] or [phone].

## 2022-09-09 ENCOUNTER — Telehealth: Payer: Self-pay

## 2022-09-09 NOTE — Telephone Encounter (Unsigned)
Patient LVM on nurse line asking if paperwork has been completed.   Returned call to patient. She states that we were supposed to receive fax from Social Services regarding her in home care.   Patient states that [place], her caseworker, had told her that these forms had been faxed to our office.   Paperwork in provider box for completion.   Veronda Prude, RN

## 2022-09-11 ENCOUNTER — Other Ambulatory Visit: Payer: 59

## 2022-09-11 NOTE — Telephone Encounter (Signed)
Called patient and daughter. Reviewed and completed form.   Form requires a good bit of demographic information that I do not have. Please include.  Reviewed, completed, and signed form.  Note routed to RN team inbasket and placed completed form in RN Wall pocket in the front office.  Westley Chandler, MD

## 2022-09-11 NOTE — Telephone Encounter (Signed)
Called [place] regarding forms. She states that only demographic information needed is patient's name and date of birth.   Also, form needs to be completed on page that is not labeled example. They are unable to accept documentation on example form.   Transcribed information onto new form and gave to Dr. Manson Passey to sign.   Faxed to [place] at [phone].  Copy made and placed in batch scanning.   Veronda Prude, RN

## 2022-09-17 ENCOUNTER — Ambulatory Visit: Payer: Self-pay

## 2022-09-17 NOTE — Patient Outreach (Signed)
  Care Coordination   Follow Up Visit Note   09/17/2022 Name: REATA WESTRICK MRN: [id number] DOB: October 25, 1935  GLORINE GERARDO is a 87 y.o. year old female who sees Nestor Ramp, MD for primary care. I spoke with  [name] by phone today.  What matters to the patients health and wellness today?  Mrs. Detore' condition continues to improve, although with some weight loss and reduced appetite. Her scheduled discussion with her nephrologist has been rescheduled to November due to another appointment. The [facility] nurse is set to visit on the 3rd of next week, and she will engage in a discussion with her. Mrs. Lautner experiences occasional swelling in her right leg, which she manages by elevation and the use of a TED hose. She consumes 2 1/2 8-ounce servings of water daily.    Goals Addressed             This Visit's Progress    I want to maintain my CHF       Patient Goals/Self Care Activities: -Patient/Caregiver will self-administer medications as prescribed as evidenced by self-report/primary caregiver report  -Patient/Caregiver will attend all scheduled provider appointments as evidenced by clinician review of documented attendance to scheduled appointments and patient/caregiver report -Patient/Caregiver will call provider office for new concerns or questions as evidenced by review of documented incoming telephone call notes and patient report -Patient/Caregiver will focus on medication adherence by taking medications as prescribed  -Weigh daily and record (notify MD with 3 lb weight gain over night or 5 lb in a week) -Follow CHF Action Plan -continue to drink water        SDOH assessments and interventions completed:  No     Care Coordination Interventions:  Yes, provided   Interventions Today    Flowsheet Row Most Recent Value  Chronic Disease   Chronic disease during today's visit Congestive Heart Failure (CHF)  General Interventions   General Interventions  Discussed/Reviewed General Interventions Discussed  Nutrition Interventions   Nutrition Discussed/Reviewed Nutrition Discussed, Fluid intake  Pharmacy Interventions   Pharmacy Dicussed/Reviewed Pharmacy Topics Discussed  Safety Interventions   Safety Discussed/Reviewed Safety Reviewed        Follow up plan: Follow up call scheduled for 10/08/22  1030 am    Encounter Outcome:  Pt. Visit Completed   [name] RN, BSN, [facility]   Care Coordinator Phone: [phone]

## 2022-09-17 NOTE — Patient Instructions (Signed)
Visit Information  Thank you for taking time to visit with me today. Please don't hesitate to contact me if I can be of assistance to you.   Following are the goals we discussed today:   Goals Addressed             This Visit's Progress    I want to maintain my CHF       Patient Goals/Self Care Activities: -Patient/Caregiver will self-administer medications as prescribed as evidenced by self-report/primary caregiver report  -Patient/Caregiver will attend all scheduled provider appointments as evidenced by clinician review of documented attendance to scheduled appointments and patient/caregiver report -Patient/Caregiver will call provider office for new concerns or questions as evidenced by review of documented incoming telephone call notes and patient report -Patient/Caregiver will focus on medication adherence by taking medications as prescribed  -Weigh daily and record (notify MD with 3 lb weight gain over night or 5 lb in a week) -Follow CHF Action Plan -continue to drink water        Our next appointment is by telephone on 10/08/22 at 1030 am  Please call the care guide team at [phone] if you need to cancel or reschedule your appointment.   If you are experiencing a Mental Health or Behavioral Health Crisis or need someone to talk to, please call 1-800-273-TALK (toll free, 24 hour hotline)  The patient verbalized understanding of instructions, educational materials, and care plan provided today.    [name] RN, BSN, [facility] Phone: [phone]

## 2022-09-23 ENCOUNTER — Telehealth: Payer: Self-pay

## 2022-09-23 ENCOUNTER — Other Ambulatory Visit: Payer: Self-pay

## 2022-09-23 ENCOUNTER — Telehealth: Payer: Self-pay | Admitting: Internal Medicine

## 2022-09-23 MED ORDER — AMOXICILLIN 500 MG PO CAPS: ORAL_CAPSULE | ORAL | 0 refills | Status: DC

## 2022-09-23 NOTE — Patient Outreach (Signed)
  Care Coordination   Care Coordination    Note   09/23/2022 Name: JANALYN BURNE MRN: [id number] DOB: 1935-07-24  JANILEE PEGAN is a 87 y.o. year old female who sees Nestor Ramp, MD for primary care. I spoke with  [name] by phone today.  What matters to the patients health and wellness today?  Mrs. Stauff called to let me know that her nephrologist advised her to discontinue taking Alendronate Sodium 70 mg as it is impacting her kidneys. She asked if I would send a communication to Dr. Jennette Kettle and also ask if there are alternative medications she can take. She also requests that you inform Dr. Kathrene Bongo at [facility] about this situation.        SDOH assessments and interventions completed:  No     Care Coordination Interventions:  Yes, provided     Interventions Today    Flowsheet Row Most Recent Value  Chronic Disease   Chronic disease during today's visit --  [CKD]  General Interventions   General Interventions Discussed/Reviewed General Interventions Discussed, Communication with  Communication with PCP/Specialists  [Sent a communication to her PCP.]        Follow Up Plan: RNCM will follow up at the next scheduled Interval.    Encounter Outcome:  Pt. Visit Completed   [name] RN, BSN, [facility] Phone: [phone]

## 2022-09-23 NOTE — Telephone Encounter (Signed)
Called pt back. Verified Pharmacy. Medication was sent in for her for her dental procedure per Dr Lubertha Basque instructions.

## 2022-09-23 NOTE — Telephone Encounter (Signed)
Patient is requesting call back to discuss medication for antibiotic she would need before a dental procedure.

## 2022-09-25 DIAGNOSIS — H401133 Primary open-angle glaucoma, bilateral, severe stage: Secondary | ICD-10-CM | POA: Diagnosis not present

## 2022-09-26 ENCOUNTER — Ambulatory Visit: Payer: 59

## 2022-09-30 ENCOUNTER — Encounter: Payer: Self-pay | Admitting: Family Medicine

## 2022-10-03 ENCOUNTER — Encounter: Payer: Self-pay | Admitting: Family Medicine

## 2022-10-08 ENCOUNTER — Ambulatory Visit: Payer: Self-pay

## 2022-10-08 NOTE — Patient Outreach (Signed)
Care Coordination   Follow Up Visit Note   10/08/2022 Name: [name] MRN: [id number] DOB: Jul 04, 1935  [name] is a 87 y.o. year old female who sees Tami Ramp, MD for primary care. I spoke with  [name] by phone today.  What matters to the patients health and wellness today?  [name] is in satisfactory condition. She is adhering to her prescribed medication regimen, with the exception of alendronate. Hydration and regular communication with her nephrology nurse are part of her routine. She denies experiencing any symptoms of chest pain or shortness of breath. Should she encounter any swelling, she is prepared to mitigate it by elevating her lower extremities and utilizing compression stockings.    Goals Addressed             This Visit's Progress    I want to maintain my CHF       Patient Goals/Self Care Activities: -Patient/Caregiver will self-administer medications as prescribed as evidenced by self-report/primary caregiver report  -Patient/Caregiver will attend all scheduled provider appointments as evidenced by clinician review of documented attendance to scheduled appointments and patient/caregiver report -Patient/Caregiver will call provider office for new concerns or questions as evidenced by review of documented incoming telephone call notes and patient report -Patient/Caregiver will focus on medication adherence by taking medications as prescribed  -Weigh daily and record (notify MD with 3 lb weight gain over night or 5 lb in a week) -get the batteries for your scale to weigh daily -Follow CHF Action Plan -continue to drink water        SDOH assessments and interventions completed:  No     Care Coordination Interventions:  Yes, provided   Interventions Today    Flowsheet Row Most Recent Value  Chronic Disease   Chronic disease during today's visit Congestive Heart Failure (CHF)  General Interventions   General Interventions  Discussed/Reviewed General Interventions Discussed, General Interventions Reviewed  [Needs to get batteries for her scale so she can start back weighing daily]  Pharmacy Interventions   Pharmacy Dicussed/Reviewed Pharmacy Topics Discussed  Safety Interventions   Safety Discussed/Reviewed Safety Discussed       Follow up plan: Follow up call scheduled for 11/07/22  1030 am    Encounter Outcome:  Patient Visit Completed   Tami Fairly RN, BSN, [name]   Care Coordinator Phone: [phone]

## 2022-10-08 NOTE — Patient Instructions (Signed)
Visit Information  Thank you for taking time to visit with me today. Please don't hesitate to contact me if I can be of assistance to you.   Following are the goals we discussed today:   Goals Addressed             This Visit's Progress    I want to maintain my CHF       Patient Goals/Self Care Activities: -Patient/Caregiver will self-administer medications as prescribed as evidenced by self-report/primary caregiver report  -Patient/Caregiver will attend all scheduled provider appointments as evidenced by clinician review of documented attendance to scheduled appointments and patient/caregiver report -Patient/Caregiver will call provider office for new concerns or questions as evidenced by review of documented incoming telephone call notes and patient report -Patient/Caregiver will focus on medication adherence by taking medications as prescribed  -Weigh daily and record (notify MD with 3 lb weight gain over night or 5 lb in a week) -get the batteries for your scale to weigh daily -Follow CHF Action Plan -continue to drink water        Our next appointment is by telephone on 11/07/22 at 1030 am  Please call the care guide team at [phone] if you need to cancel or reschedule your appointment.   If you are experiencing a Mental Health or Behavioral Health Crisis or need someone to talk to, please call 1-800-273-TALK (toll free, 24 hour hotline)  The patient verbalized understanding of instructions, educational materials, and care plan provided today.    [name] RN, BSN, [facility] Phone: [phone]

## 2022-10-10 ENCOUNTER — Ambulatory Visit: Payer: 59 | Attending: Cardiovascular Disease | Admitting: *Deleted

## 2022-10-10 DIAGNOSIS — Z5181 Encounter for therapeutic drug level monitoring: Secondary | ICD-10-CM

## 2022-10-10 DIAGNOSIS — Z952 Presence of prosthetic heart valve: Secondary | ICD-10-CM | POA: Diagnosis not present

## 2022-10-10 LAB — POCT INR: POC INR: 1.8

## 2022-10-10 NOTE — Patient Instructions (Signed)
Description   Take 1.5 tablets of warfarin today and tomorrow Then START taking warfarin 1 tablet daily except for 1.5 tablets on Sundays and Thursdays.  Recheck INR in 2 weeks.  Remain consistent with your 2 serving of greens per week and your 2 ensures a week.  [facility] Clinic [phone] or [phone].

## 2022-10-21 ENCOUNTER — Ambulatory Visit: Payer: 59 | Admitting: Podiatry

## 2022-10-21 DIAGNOSIS — Z961 Presence of intraocular lens: Secondary | ICD-10-CM | POA: Diagnosis not present

## 2022-10-21 DIAGNOSIS — H401133 Primary open-angle glaucoma, bilateral, severe stage: Secondary | ICD-10-CM | POA: Diagnosis not present

## 2022-10-23 ENCOUNTER — Other Ambulatory Visit: Payer: 59

## 2022-10-24 ENCOUNTER — Ambulatory Visit: Payer: 59 | Attending: Cardiology

## 2022-10-24 DIAGNOSIS — Z5181 Encounter for therapeutic drug level monitoring: Secondary | ICD-10-CM | POA: Diagnosis not present

## 2022-10-24 DIAGNOSIS — Z952 Presence of prosthetic heart valve: Secondary | ICD-10-CM

## 2022-10-24 LAB — POCT INR: INR: 2.6 (ref 2.0–3.0)

## 2022-10-24 NOTE — Patient Instructions (Signed)
Description   Take 1.5 tablets of warfarin today and then continue taking warfarin 1 tablet daily except for 1.5 tablets on Sundays and Thursdays.  Recheck INR in 3 weeks.  Remain consistent with your 2 serving of greens per week and your 2 ensures a week.  [facility] Clinic [phone] or [phone].

## 2022-10-28 ENCOUNTER — Encounter: Payer: Self-pay | Admitting: Family Medicine

## 2022-10-28 ENCOUNTER — Ambulatory Visit: Payer: 59

## 2022-10-28 VITALS — BP 136/106 | HR 63 | Temp 98.1°F | Ht 60.0 in | Wt 150.1 lb

## 2022-10-28 DIAGNOSIS — B349 Viral infection, unspecified: Secondary | ICD-10-CM

## 2022-10-28 DIAGNOSIS — I10 Essential (primary) hypertension: Secondary | ICD-10-CM

## 2022-10-28 LAB — POC SOFIA 2 FLU + SARS ANTIGEN FIA
Influenza A, POC: NEGATIVE
Influenza B, POC: NEGATIVE
SARS Coronavirus 2 Ag: NEGATIVE

## 2022-10-28 NOTE — Progress Notes (Signed)
    SUBJECTIVE:   CHIEF COMPLAINT / HPI:   Fever, diarrhea, runny nose Saturday 10/5, had subjective fever and chills with runny nose and mild abd pain Felt fatigued and dizzy BP was high She did not take her hydralazine today   PERTINENT  PMH / PSH: History of mitral valve replacement on Coumadin.  CKD 3.  History of A-fib.  History of CHF on Lasix.  OBJECTIVE:   BP (!) 136/106   Pulse 63   Temp 98.1 F (36.7 C) (Oral)   Ht 5' (1.524 m)   Wt 150 lb 2 oz (68.1 kg)   SpO2 98%   BMI 29.32 kg/m    General: NAD, pleasant, able to participate in exam Cardiac: RRR, no murmurs auscultated Respiratory: CTAB, normal WOB Abdomen: soft, non-tender, non-distended, normoactive bowel sounds Extremities: warm and well perfused, no edema or cyanosis Skin: warm and dry, no rashes noted Neuro: alert, no obvious focal deficits, speech normal Psych: Normal affect and mood  ASSESSMENT/PLAN:   Assessment & Plan Viral illness Overall nontoxic-appearing on exam, COVID/flu negative, sent letter with results.  Does not appear to be in a acute exacerbation of CHF.  Discussed return precautions and supportive care. Primary hypertension Asymptomatic.  Elevated BP although patient did not take her hydralazine today, advised her to continue meds as prescribed, monitoring at home and follow-up in 2 weeks to discuss BP regimen   Vonna Drafts, MD [facility]

## 2022-10-28 NOTE — Assessment & Plan Note (Signed)
Asymptomatic.  Elevated BP although patient did not take her hydralazine today, advised her to continue meds as prescribed, monitoring at home and follow-up in 2 weeks to discuss BP regimen

## 2022-10-28 NOTE — Patient Instructions (Addendum)
I will call with your COVID results if they are positive. If it is negative I'll send you a letter in the mail.  Please make an appointment for 2 weeks from now to revisit your blood pressure management

## 2022-10-31 ENCOUNTER — Telehealth: Payer: Self-pay | Admitting: *Deleted

## 2022-10-31 NOTE — Progress Notes (Signed)
Care Coordination Note  10/31/2022 Name: [name] MRN: [id number] DOB: 1935/03/31  [name] is a 87 y.o. year old female who is a primary care patient of Nestor Ramp, MD and is actively engaged with the care management team. I reached out to [name] by phone today to assist with re-scheduling a follow up visit with the RN Case Manager  Follow up plan: Unsuccessful telephone outreach attempt made. A HIPAA compliant phone message was left for the patient providing contact information and requesting a return call.   [facility]  Care Coordination Care Guide  Direct Dial: [phone]

## 2022-10-31 NOTE — Progress Notes (Signed)
Care Coordination Note  10/31/2022 Name: EADIE ZIESMER MRN: [id number] DOB: 1935-02-10  KEIMYA KILBURG is a 87 y.o. year old female who is a primary care patient of Nestor Ramp, MD and is actively engaged with the care management team. I reached out to [name] by phone today to assist with re-scheduling a follow up visit with the RN Case Manager  Follow up plan: Telephone appointment with care management team member scheduled for:11/19/22  [facility] Coordination Care Guide  Direct Dial: [phone]

## 2022-11-04 ENCOUNTER — Ambulatory Visit
Admission: RE | Admit: 2022-11-04 | Discharge: 2022-11-04 | Disposition: A | Discharge status: Home / Self Care | Payer: 59 | Source: Ambulatory Visit | Attending: Gastroenterology | Admitting: Gastroenterology

## 2022-11-04 DIAGNOSIS — K7460 Unspecified cirrhosis of liver: Secondary | ICD-10-CM

## 2022-11-10 NOTE — Progress Notes (Unsigned)
    SUBJECTIVE:   CHIEF COMPLAINT / HPI:   BP follow up Seen 10/8 for viral illness, noted to have elevated BP although pt had not taken her hydralazine prior to that visit ***  PERTINENT  PMH / PSH: ***  OBJECTIVE:   There were no vitals taken for this visit.  ***  ASSESSMENT/PLAN:   Assessment & Plan    Vonna Drafts, MD [facility]

## 2022-11-11 ENCOUNTER — Encounter: Payer: Self-pay | Admitting: Family Medicine

## 2022-11-11 ENCOUNTER — Ambulatory Visit: Payer: 59 | Admitting: Family Medicine

## 2022-11-11 VITALS — BP 120/78 | HR 82 | Ht 60.0 in | Wt 147.4 lb

## 2022-11-11 DIAGNOSIS — I10 Essential (primary) hypertension: Secondary | ICD-10-CM | POA: Diagnosis not present

## 2022-11-11 DIAGNOSIS — Z23 Encounter for immunization: Secondary | ICD-10-CM | POA: Diagnosis not present

## 2022-11-11 NOTE — Patient Instructions (Signed)
Please continue all of your current medications

## 2022-11-11 NOTE — Assessment & Plan Note (Addendum)
Stable, suspect had elevated reading at last visit due to acute illness and because she had not taken her meds. Continue current regimen (on hydralazine, imdur, toprol-xl, lasix), f/u 3 mo

## 2022-11-14 ENCOUNTER — Ambulatory Visit: Payer: 59 | Attending: Internal Medicine

## 2022-11-14 DIAGNOSIS — Z5181 Encounter for therapeutic drug level monitoring: Secondary | ICD-10-CM | POA: Diagnosis not present

## 2022-11-14 DIAGNOSIS — Z952 Presence of prosthetic heart valve: Secondary | ICD-10-CM | POA: Diagnosis not present

## 2022-11-14 LAB — POCT INR: INR: 2 (ref 2.0–3.0)

## 2022-11-14 NOTE — Patient Instructions (Signed)
Take 2 tablets of warfarin today and then continue taking warfarin 1 tablet daily except for 1.5 tablets on Sundays and Thursdays.  Recheck INR in 3 weeks.  Remain consistent with your 2 serving of greens per week and your 2 ensures a week.  Coumadin Clinic [phone] or [phone].

## 2022-11-18 ENCOUNTER — Encounter: Payer: Self-pay | Admitting: Podiatry

## 2022-11-18 ENCOUNTER — Ambulatory Visit (INDEPENDENT_AMBULATORY_CARE_PROVIDER_SITE_OTHER): Payer: 59 | Admitting: Podiatry

## 2022-11-18 VITALS — Ht 60.0 in | Wt 147.4 lb

## 2022-11-18 DIAGNOSIS — M79675 Pain in left toe(s): Secondary | ICD-10-CM | POA: Diagnosis not present

## 2022-11-18 DIAGNOSIS — M79671 Pain in right foot: Secondary | ICD-10-CM

## 2022-11-18 DIAGNOSIS — M79674 Pain in right toe(s): Secondary | ICD-10-CM | POA: Diagnosis not present

## 2022-11-18 DIAGNOSIS — B351 Tinea unguium: Secondary | ICD-10-CM

## 2022-11-18 DIAGNOSIS — M79672 Pain in left foot: Secondary | ICD-10-CM

## 2022-11-18 DIAGNOSIS — Z7901 Long term (current) use of anticoagulants: Secondary | ICD-10-CM

## 2022-11-18 DIAGNOSIS — L84 Corns and callosities: Secondary | ICD-10-CM

## 2022-11-19 ENCOUNTER — Ambulatory Visit: Payer: Self-pay

## 2022-11-19 NOTE — Patient Instructions (Signed)
Visit Information  Thank you for taking time to visit with me today. Please don't hesitate to contact me if I can be of assistance to you.   Following are the goals we discussed today:   Goals Addressed             This Visit's Progress    I want to maintain my CHF       Patient Goals/Self Care Activities: -Patient/Caregiver will self-administer medications as prescribed as evidenced by self-report/primary caregiver report  -Patient/Caregiver will attend all scheduled provider appointments as evidenced by clinician review of documented attendance to scheduled appointments and patient/caregiver report -Patient/Caregiver will call provider office for new concerns or questions as evidenced by review of documented incoming telephone call notes and patient report -Patient/Caregiver will focus on medication adherence by taking medications as prescribed  -Weigh daily and record (notify MD with 3 lb weight gain over night or 5 lb in a week) -Don't forget to talk with your Nephrologist about your Lasix regiment. -Follow CHF Action Plan        Our next appointment is by telephone on 12/11/22 at 130 pm  Please call the care guide team at [phone] if you need to cancel or reschedule your appointment.   If you are experiencing a Mental Health or Behavioral Health Crisis or need someone to talk to, please call 1-800-273-TALK (toll free, 24 hour hotline)  The patient verbalized understanding of instructions, educational materials, and care plan provided today.    [name] RN, BSN, [facility] [place]  [facility], [facility]  Care Coordinator Phone: [phone]

## 2022-11-19 NOTE — Patient Outreach (Signed)
Care Coordination   Follow Up Visit Note   11/19/2022 Name: [name] MRN: [id number] DOB: Feb 19, 1935  [name] is a 87 y.o. year old female who sees Nestor Ramp, MD for primary care. I spoke with  [name] by phone today.  What matters to the patients health and wellness today?  [name] reported that she is doing well. She had an appointment with her primary care physician on October 27th, during which her blood pressure was recorded at 120/78. She also received her flu shot and COVID-19 vaccine. Additionally, she attended a podiatry appointment where her toenails were clipped, and a fungus was discovered. [name] is maintaining her weight at 147 pounds and is taking her medication as prescribed, including her diuretic every other day. She has not experienced any chest pain, shortness of breath, or swelling. While her appetite is good, her sleep patterns vary. She has a nephrology appointment scheduled for November.     Goals Addressed             This Visit's Progress    I want to maintain my CHF       Patient Goals/Self Care Activities: -Patient/Caregiver will self-administer medications as prescribed as evidenced by self-report/primary caregiver report  -Patient/Caregiver will attend all scheduled provider appointments as evidenced by clinician review of documented attendance to scheduled appointments and patient/caregiver report -Patient/Caregiver will call provider office for new concerns or questions as evidenced by review of documented incoming telephone call notes and patient report -Patient/Caregiver will focus on medication adherence by taking medications as prescribed  -Weigh daily and record (notify MD with 3 lb weight gain over night or 5 lb in a week) -Don't forget to talk with your Nephrologist about your Lasix regiment. -Follow CHF Action Plan        SDOH assessments and interventions completed:  No     Care Coordination  Interventions:  Yes, provided   Interventions Today    Flowsheet Row Most Recent Value  Chronic Disease   Chronic disease during today's visit Congestive Heart Failure (CHF)  General Interventions   General Interventions Discussed/Reviewed General Interventions Discussed, General Interventions Reviewed, Vaccines  Vaccines Flu, COVID-19  Pharmacy Interventions   Pharmacy Dicussed/Reviewed Pharmacy Topics Discussed, Pharmacy Topics Reviewed  Safety Interventions   Safety Discussed/Reviewed Safety Discussed        Follow up plan: Follow up call scheduled for 11/21/524  130 pm    Encounter Outcome:  Patient Visit Completed   [name] RN, BSN, [facility]  [facility], [facility]  Care Coordinator Phone: [phone]

## 2022-11-23 ENCOUNTER — Encounter: Payer: Self-pay | Admitting: Podiatry

## 2022-11-23 NOTE — Progress Notes (Signed)
  Subjective:  Patient ID: [name], female    DOB: 07/21/1935,  MRN: [id number]  87 y.o. female presents with at risk foot care. Patient has h/o PAD and callus(es) of both feet and painful thick toenails that are difficult to trim. Painful toenails interfere with ambulation. Aggravating factors include wearing enclosed shoe gear. Pain is relieved with periodic professional debridement. Painful calluses are aggravated when weightbearing with and without shoegear. Pain is relieved with periodic professional debridement. Chief Complaint  Patient presents with   Nail Problem    RFC, Pt is not a diabetic, last office visit was 3 months ago, PCP is Dr Jennette Kettle.    PCP: Nestor Ramp, MD.  New problem(s): None.   Review of Systems: Negative except as noted in the HPI.   Allergies  Allergen Reactions   Iodinated Contrast Media Rash    Pt stated in the past had broken out in a rash on back and itching.    Esomeprazole Magnesium     REACTION: stomach upset, nausea    Objective:  There were no vitals filed for this visit. Constitutional Patient is a pleasant 87 y.o. African American female WD, WN in NAD. AAO x 3.  Vascular Capillary fill time to digits <3 seconds.  DP/PT pulse(s) are faintly palpable b/l lower extremities. Pedal hair absent b/l. Lower extremity skin temperature gradient warm to cool b/l. No pain with calf compression b/l. No cyanosis or clubbing noted. No ischemia nor gangrene noted b/l.   Neurologic Protective sensation intact 5/5 intact bilaterally with 10g monofilament b/l. Vibratory sensation intact b/l. No clonus b/l.   Dermatologic Pedal skin is thin, shiny and atrophic b/l.  No open wounds b/l lower extremities. No interdigital macerations b/l lower extremities. Toenails 1-5 b/l elongated, discolored, dystrophic, thickened, crumbly with subungual debris and tenderness to dorsal palpation. Hyperkeratotic lesion(s) submet head 2 right foot and submet head 5 b/l.  No erythema,  no edema, no drainage, no fluctuance.  Orthopedic: Normal muscle strength 5/5 to all lower extremity muscle groups bilaterally. Muscle strength 5/5 to all lower extremity muscle groups bilaterally. No pain, crepitus or joint limitation noted with ROM bilateral LE.   Last HgA1c:      No data to display           Assessment:   1. Pain due to onychomycosis of toenails of both feet   2. Callus   3. Pain in both feet   4. Chronic anticoagulation    Plan:  Patient was evaluated and treated and all questions answered. Consent given for treatment as described below: -Patient to continue soft, supportive shoe gear daily. -Toenails 1-5 b/l were debrided in length and girth with sterile nail nippers and dremel without iatrogenic bleeding.  -Callus(es) submet head 2 right foot and submet head 5 b/l pared utilizing sterile scalpel blade without complication or incident. Total number debrided =3. -Patient/POA to call should there be question/concern in the interim.  Return in about 3 months (around 02/18/2023).  Freddie Breech, DPM

## 2022-11-24 ENCOUNTER — Other Ambulatory Visit: Payer: Self-pay | Admitting: Family Medicine

## 2022-12-05 ENCOUNTER — Ambulatory Visit: Payer: 59 | Attending: Internal Medicine

## 2022-12-05 DIAGNOSIS — Z952 Presence of prosthetic heart valve: Secondary | ICD-10-CM | POA: Diagnosis not present

## 2022-12-05 LAB — POCT INR: INR: 1.8 — AB (ref 2.0–3.0)

## 2022-12-05 NOTE — Patient Instructions (Signed)
Description   Take 2 tablets of warfarin today and then START taking 1 tablet daily 1.5 tablets on Mondays, Wednesdays, and Fridays.  Recheck INR in 2 weeks.  Remain consistent with your 2 serving of greens per week and your 2 ensures a week.  [facility] [phone].

## 2022-12-09 DIAGNOSIS — N183 Chronic kidney disease, stage 3 unspecified: Secondary | ICD-10-CM | POA: Diagnosis not present

## 2022-12-09 DIAGNOSIS — I509 Heart failure, unspecified: Secondary | ICD-10-CM | POA: Diagnosis not present

## 2022-12-09 DIAGNOSIS — N390 Urinary tract infection, site not specified: Secondary | ICD-10-CM | POA: Diagnosis not present

## 2022-12-09 DIAGNOSIS — R339 Retention of urine, unspecified: Secondary | ICD-10-CM | POA: Diagnosis not present

## 2022-12-11 ENCOUNTER — Ambulatory Visit: Payer: Self-pay

## 2022-12-11 NOTE — Patient Outreach (Signed)
  Care Coordination   Follow Up Visit Note   12/11/2022 Name: MARLISSA ARKWRIGHT MRN: [id number] DOB: 1935/11/28  ADIYA LIVIGNI is a 87 y.o. year old female who sees Nestor Ramp, MD for primary care. I spoke with  [name] by phone today.  What matters to the patients health and wellness today?   Mrs. Almendinger is doing well. She recently saw her nephrologist, who advised that she take two of her fluid pills on Mondays, Wednesdays, and Fridays, and one pill on Tuesdays and Thursdays. She weighs herself daily, and her current weight is 142 lbs. Mrs. Tieu denies experiencing any chest pain or shortness of breath, but she reports some slight swelling in her legs, which is not easily noticeable. However, upon touching her legs, there is a bit of swelling evident. I explained to her that I would no longer be her nurse case manager and that she would be assigned to a new case manager, Danise Edge.      Goals Addressed             This Visit's Progress    I want to maintain my CHF       Patient Goals/Self Care Activities: -Patient/Caregiver will self-administer medications as prescribed as evidenced by self-report/primary caregiver report  -Patient/Caregiver will attend all scheduled provider appointments as evidenced by clinician review of documented attendance to scheduled appointments and patient/caregiver report -Patient/Caregiver will call provider office for new concerns or questions as evidenced by review of documented incoming telephone call notes and patient report -Patient/Caregiver will focus on medication adherence by taking medications as prescribed  -Weigh daily and record (notify MD with 3 lb weight gain over night or 5 lb in a week) -Don't forget to talk with your Nephrologist about your Lasix regiment. -Follow CHF Action Plan -Stay with your nephrology plan to take your fluid pills 2 pills on Mondays Wednesdays and Fridays and 1 pill on Tuesdays and Thursdays.          SDOH assessments and interventions completed:  No     Care Coordination Interventions:  Yes, provided   Interventions Today    Flowsheet Row Most Recent Value  Chronic Disease   Chronic disease during today's visit Congestive Heart Failure (CHF)  General Interventions   General Interventions Discussed/Reviewed General Interventions Discussed  Pharmacy Interventions   Pharmacy Dicussed/Reviewed Pharmacy Topics Discussed  Safety Interventions   Safety Discussed/Reviewed Safety Discussed        Follow up plan: No further intervention required.   Encounter Outcome:  Patient Visit Completed   [name] RN, BSN, [facility] [place]  [facility]  Care Coordinator Phone: [phone]

## 2022-12-12 LAB — LAB REPORT - SCANNED
Creatinine, POC: 121.4 mg/dL
EGFR: 23

## 2022-12-12 NOTE — Patient Instructions (Signed)
Visit Information  Thank you for taking time to visit with me today. Please don't hesitate to contact me if I can be of assistance to you.   Following are the goals we discussed today:   Goals Addressed             This Visit's Progress    I want to maintain my CHF       Patient Goals/Self Care Activities: -Patient/Caregiver will self-administer medications as prescribed as evidenced by self-report/primary caregiver report  -Patient/Caregiver will attend all scheduled provider appointments as evidenced by clinician review of documented attendance to scheduled appointments and patient/caregiver report -Patient/Caregiver will call provider office for new concerns or questions as evidenced by review of documented incoming telephone call notes and patient report -Patient/Caregiver will focus on medication adherence by taking medications as prescribed  -Weigh daily and record (notify MD with 3 lb weight gain over night or 5 lb in a week) -Don't forget to talk with your Nephrologist about your Lasix regiment. -Follow CHF Action Plan -Stay with your nephrology plan to take your fluid pills 2 pills on Mondays Wednesdays and Fridays and 1 pill on Tuesdays and Thursdays.          If you are experiencing a Mental Health or Behavioral Health Crisis or need someone to talk to, please call 1-800-273-TALK (toll free, 24 hour hotline)  The patient verbalized understanding of instructions, educational materials, and care plan provided today and agreed to receive a mailed copy of patient instructions, educational materials, and care plan.   [name] RN, BSN, [facility] [place]  [facility], [facility]  Care Coordinator Phone: [phone]

## 2022-12-12 NOTE — Patient Outreach (Deleted)
  Care Coordination   Follow Up Visit Note   12/12/2022 Name: [name] MRN: [id number] DOB: 19-May-1935  [name] is a 87 y.o. year old female who sees Nestor Ramp, MD for primary care. I spoke with  [name] by phone today.  What matters to the patients health and wellness today?  [name] is doing well. She recently saw her nephrologist, who advised that she take two of her fluid pills on Mondays, Wednesdays, and Fridays, and one pill on Tuesdays and Thursdays. She weighs herself daily, and her current weight is 142 lbs. [name] denies experiencing any chest pain or shortness of breath, but she reports some slight swelling in her legs, which is not easily noticeable. However, upon touching her legs, there is a bit of swelling evident. I explained to her that I would no longer be her nurse case manager and that she would be assigned to a new case manager, Danise Edge.     Goals Addressed             This Visit's Progress    I want to maintain my CHF       Patient Goals/Self Care Activities: -Patient/Caregiver will self-administer medications as prescribed as evidenced by self-report/primary caregiver report  -Patient/Caregiver will attend all scheduled provider appointments as evidenced by clinician review of documented attendance to scheduled appointments and patient/caregiver report -Patient/Caregiver will call provider office for new concerns or questions as evidenced by review of documented incoming telephone call notes and patient report -Patient/Caregiver will focus on medication adherence by taking medications as prescribed  -Weigh daily and record (notify MD with 3 lb weight gain over night or 5 lb in a week) -Don't forget to talk with your Nephrologist about your Lasix regiment. -Follow CHF Action Plan -Stay with your nephrology plan to take your fluid pills 2 pills on Mondays Wednesdays and Fridays and 1 pill on Tuesdays and Thursdays.          SDOH assessments and interventions completed:  No{THN Tip this will not be part of the note when signed-REQUIRED REPORT FIELD DO NOT DELETE (Optional):27901}     Care Coordination Interventions:  Yes, provided {THN Tip this will not be part of the note when signed-REQUIRED REPORT FIELD DO NOT DELETE (Optional):27901}  Interventions Today    Flowsheet Row Most Recent Value  Chronic Disease   Chronic disease during today's visit Congestive Heart Failure (CHF)  General Interventions   General Interventions Discussed/Reviewed General Interventions Discussed  Pharmacy Interventions   Pharmacy Dicussed/Reviewed Pharmacy Topics Discussed  Safety Interventions   Safety Discussed/Reviewed Safety Discussed        Follow up plan: No further intervention required.   Encounter Outcome:  Patient Visit Completed {THN Tip this will not be part of the note when signed-REQUIRED REPORT FIELD DO NOT DELETE (Optional):27901}  Lonzo Candy, BSN, [facility] [place]  [facility], [facility]  Care Coordinator Phone: [phone]

## 2022-12-22 ENCOUNTER — Other Ambulatory Visit: Payer: Self-pay | Admitting: Internal Medicine

## 2022-12-22 DIAGNOSIS — I482 Chronic atrial fibrillation, unspecified: Secondary | ICD-10-CM

## 2022-12-23 ENCOUNTER — Ambulatory Visit: Payer: 59

## 2022-12-24 ENCOUNTER — Other Ambulatory Visit: Payer: Self-pay | Admitting: Family Medicine

## 2023-01-02 ENCOUNTER — Ambulatory Visit: Payer: 59 | Attending: Cardiology

## 2023-01-02 DIAGNOSIS — Z952 Presence of prosthetic heart valve: Secondary | ICD-10-CM | POA: Diagnosis not present

## 2023-01-02 DIAGNOSIS — Z5181 Encounter for therapeutic drug level monitoring: Secondary | ICD-10-CM

## 2023-01-02 LAB — POCT INR: INR: 2.3 (ref 2.0–3.0)

## 2023-01-02 NOTE — Patient Instructions (Signed)
Take 2.5 tablets of warfarin today and then continue taking 1 tablet daily 1.5 tablets on Mondays, Wednesdays, and Fridays.  Recheck INR in 4 weeks.  Remain consistent with your 2 serving of greens per week and your 2 ensures a week.  [facility] [phone].

## 2023-01-06 ENCOUNTER — Encounter: Payer: Self-pay | Admitting: *Deleted

## 2023-01-06 ENCOUNTER — Other Ambulatory Visit: Payer: Self-pay | Admitting: *Deleted

## 2023-01-06 ENCOUNTER — Other Ambulatory Visit: Payer: Self-pay | Admitting: Family Medicine

## 2023-01-06 NOTE — Patient Outreach (Signed)
Care Management   Visit Note  01/06/2023 Name: [name] MRN: [id number] DOB: [birth year]  Subjective: [name] is a 87 y.o. year old female who is a primary care patient of Nestor Ramp, MD. The Care Management team was consulted for assistance.      Engaged with patient spoke with patient by telephone.    Goals Addressed             This Visit's Progress    RNCM Care Management Expected Outcomes: Monitor, Self-Manage and Reduce Sympomts of: CHF       Current Barriers:  Knowledge Deficits related to plan of care for management of CHF  Chronic Disease Management support and education needs related to CHF   RNCM Clinical Goal(s):  Patient will verbalize basic understanding of  CHF disease process and self health management plan as evidenced by verbal explanation, recognizing symptoms and lifestyle modifications take all medications exactly as prescribed and will call provider for medication related questions as evidenced by compliance with all medications attend all scheduled medical appointments: with primary care provider and specialist as evidenced by keeping all scheduled appointments demonstrate Improved and Ongoing adherence to prescribed treatment plan for CHF as evidenced by consistent medication compliance, symptom monitoring and continued lifestyle modifications continue to work with RN Care Manager to address care management and care coordination needs related to  CHF as evidenced by adherence to CM Team Scheduled appointments through collaboration with RN Care manager, provider, and care team.   Interventions: Evaluation of current treatment plan related to  self management and patient's adherence to plan as established by provider Wt Readings from Last 3 Encounters:  11/18/22 147 lb 6.4 oz (66.9 kg)  11/11/22 147 lb 6.4 oz (66.9 kg)  10/28/22 150 lb 2 oz (68.1 kg)     Heart Failure Interventions:  (Status:  New goal. and Goal on track:  Yes.) Long Term  Goal Basic overview and discussion of pathophysiology of Heart Failure reviewed.  Provided education on low sodium diet Reviewed Heart Failure Action Plan in depth and provided written copy Assessed need for readable accurate scales in home Provided education about placing scale on hard, flat surface Advised patient to weigh each morning after emptying bladder Discussed importance of daily weight and advised patient to weigh and record daily. Does not weigh daily. RNCM advised to weigh daily and record. States she does wear TED hose daily and reclines in her chair when sitting up. Reviewed role of diuretics in prevention of fluid overload and management of heart failure; Discussed the importance of keeping all appointments with provider Provided patient with education about the role of exercise in the management of heart failure Advised patient to discuss any new changes with provider Screening for signs and symptoms of depression related to chronic disease state  Assessed social determinant of health barriers   Patient Goals/Self-Care Activities: Take all medications as prescribed Attend all scheduled provider appointments Call pharmacy for medication refills 3-7 days in advance of running out of medications Attend church or other social activities Perform all self care activities independently  Perform IADL's (shopping, preparing meals, housekeeping, managing finances) independently Call provider office for new concerns or questions  call office if I gain more than 2 pounds in one day or 5 pounds in one week keep legs up while sitting use salt in moderation watch for swelling in feet, ankles and legs every day  Follow Up Plan:  Telephone follow up appointment with care management team  member scheduled for:  02-05-2023 at 2:30 pm           Consent to Services:  Patient was given information about care management services, agreed to services, and gave verbal consent to participate.    Plan: Telephone follow up appointment with care management team member scheduled for:02-05-2023 at 2:30 pm  Danise Edge, BSN RN RN Care Manager  [facility]  Ambulatory Care Management  Direct Number: [phone]

## 2023-01-06 NOTE — Patient Instructions (Signed)
Visit Information  Thank you for taking time to visit with me today. Please don't hesitate to contact me if I can be of assistance to you before our next scheduled telephone appointment.  Following are the goals we discussed today:   Goals Addressed             This Visit's Progress    RNCM Care Management Expected Outcomes: Monitor, Self-Manage and Reduce Sympomts of: CHF       Current Barriers:  Knowledge Deficits related to plan of care for management of CHF  Chronic Disease Management support and education needs related to CHF   RNCM Clinical Goal(s):  Patient will verbalize basic understanding of  CHF disease process and self health management plan as evidenced by verbal explanation, recognizing symptoms and lifestyle modifications take all medications exactly as prescribed and will call provider for medication related questions as evidenced by compliance with all medications attend all scheduled medical appointments: with primary care provider and specialist as evidenced by keeping all scheduled appointments demonstrate Improved and Ongoing adherence to prescribed treatment plan for CHF as evidenced by consistent medication compliance, symptom monitoring and continued lifestyle modifications continue to work with RN Care Manager to address care management and care coordination needs related to  CHF as evidenced by adherence to CM Team Scheduled appointments through collaboration with RN Care manager, provider, and care team.   Interventions: Evaluation of current treatment plan related to  self management and patient's adherence to plan as established by provider Wt Readings from Last 3 Encounters:  11/18/22 147 lb 6.4 oz (66.9 kg)  11/11/22 147 lb 6.4 oz (66.9 kg)  10/28/22 150 lb 2 oz (68.1 kg)     Heart Failure Interventions:  (Status:  New goal. and Goal on track:  Yes.) Long Term Goal Basic overview and discussion of pathophysiology of Heart Failure reviewed.  Provided  education on low sodium diet Reviewed Heart Failure Action Plan in depth and provided written copy Assessed need for readable accurate scales in home Provided education about placing scale on hard, flat surface Advised patient to weigh each morning after emptying bladder Discussed importance of daily weight and advised patient to weigh and record daily. Does not weigh daily. RNCM advised to weigh daily and record. States she does wear TED hose daily and reclines in her chair when sitting up. Reviewed role of diuretics in prevention of fluid overload and management of heart failure; Discussed the importance of keeping all appointments with provider Provided patient with education about the role of exercise in the management of heart failure Advised patient to discuss any new changes with provider Screening for signs and symptoms of depression related to chronic disease state  Assessed social determinant of health barriers   Patient Goals/Self-Care Activities: Take all medications as prescribed Attend all scheduled provider appointments Call pharmacy for medication refills 3-7 days in advance of running out of medications Attend church or other social activities Perform all self care activities independently  Perform IADL's (shopping, preparing meals, housekeeping, managing finances) independently Call provider office for new concerns or questions  call office if I gain more than 2 pounds in one day or 5 pounds in one week keep legs up while sitting use salt in moderation watch for swelling in feet, ankles and legs every day  Follow Up Plan:  Telephone follow up appointment with care management team member scheduled for:  02-05-2023 at 2:30 pm           Our next  appointment is by telephone on 02-05-2023 at 2:30 pm  Please call the care guide team at [phone] if you need to cancel or reschedule your appointment.   If you are experiencing a Mental Health or Behavioral Health Crisis  or need someone to talk to, please call the Suicide and Crisis Lifeline: 988 call the [place] National Suicide Prevention Lifeline: 6302049944 or TTY: 234-464-2474 TTY 704-563-4396) to talk to a trained counselor call 1-800-273-TALK (toll free, 24 hour hotline) go to [facility] [address] [place] [phone])   The patient verbalized understanding of instructions, educational materials, and care plan provided today and DECLINED offer to receive copy of patient instructions, educational materials, and care plan.   Danise Edge, BSN RN RN Care Manager  [facility]  Direct Number: [phone]

## 2023-01-12 ENCOUNTER — Encounter: Payer: Self-pay | Admitting: Family Medicine

## 2023-02-02 ENCOUNTER — Ambulatory Visit: Payer: 59

## 2023-02-04 DIAGNOSIS — N3000 Acute cystitis without hematuria: Secondary | ICD-10-CM | POA: Diagnosis not present

## 2023-02-05 ENCOUNTER — Other Ambulatory Visit: Payer: Self-pay | Admitting: *Deleted

## 2023-02-05 ENCOUNTER — Telehealth: Payer: Self-pay | Admitting: *Deleted

## 2023-02-05 DIAGNOSIS — I5042 Chronic combined systolic (congestive) and diastolic (congestive) heart failure: Secondary | ICD-10-CM

## 2023-02-05 NOTE — Progress Notes (Signed)
Complex Care Management Care Guide Note  02/05/2023 Name: [name] MRN: [id number] DOB: [birth year]  [name] is a 88 y.o. year old female who is a primary care patient of Nestor Ramp, MD and is actively engaged with the care management team. I reached out to [name] by phone today to assist with scheduling  with the Licensed Clinical Social Worker.  Follow up plan: Telephone appointment with complex care management team member scheduled for:  02/11/23  Gwenevere Ghazi  [facility]  [facility], [facility] Guide  Direct Dial: [phone]  Fax [phone]

## 2023-02-05 NOTE — Addendum Note (Signed)
Addended by: Ricky Stabs on: 02/05/2023 03:06 PM   Modules accepted: Orders

## 2023-02-05 NOTE — Patient Instructions (Addendum)
Visit Information  Thank you for taking time to visit with me today. Please don't hesitate to contact me if I can be of assistance to you before our next scheduled telephone appointment.  Following are the goals we discussed today:   Goals Addressed             This Visit's Progress    RNCM Care Management Expected Outcomes: Monitor, Self-Manage and Reduce Sympomts of: CHF       Current Barriers:  Knowledge Deficits related to plan of care for management of CHF  Care Coordination needs related to Limited access to caregiver Chronic Disease Management support and education needs related to CHF   RNCM Clinical Goal(s):  Patient will verbalize basic understanding of  CHF disease process and self health management plan as evidenced by verbal explanation, recognizing symptoms and lifestyle modifications take all medications exactly as prescribed and will call provider for medication related questions as evidenced by compliance with all medications attend all scheduled medical appointments: with primary care provider and specialist as evidenced by keeping all scheduled appointments demonstrate Improved and Ongoing adherence to prescribed treatment plan for CHF as evidenced by consistent medication compliance, symptom monitoring and continued lifestyle modifications continue to work with RN Care Manager to address care management and care coordination needs related to  CHF as evidenced by adherence to CM Team Scheduled appointments through collaboration with RN Care manager, provider, and care team.   Interventions: Evaluation of current treatment plan related to  self management and patient's adherence to plan as established by provider Wt Readings from Last 3 Encounters:  11/18/22 147 lb 6.4 oz (66.9 kg)  11/11/22 147 lb 6.4 oz (66.9 kg)  10/28/22 150 lb 2 oz (68.1 kg)     Heart Failure Interventions:  (Status:  Goal on track:  Yes.) Long Term Goal Basic overview and discussion of  pathophysiology of Heart Failure reviewed. Reports no changing with her hearth health. Denies any swelling. Continues wearing her compression stockings. Provided education on low sodium diet. States she does not use salt or seasonings. Reviewed Heart Failure Action Plan in depth and provided written copy Assessed need for readable accurate scales in home. Reports that she needs batteries. Provided education about placing scale on hard, flat surface Advised patient to weigh each morning after emptying bladder Discussed importance of daily weight and advised patient to weigh and record daily. Has not been weighing daily due to scale needing batteries. RNCM reiterated the importance of daily weighs. Reviewed role of diuretics in prevention of fluid overload and management of heart failure;Marland Kitchen Reports that nephrology has changed her frequency to 3x/week. Discussed the importance of keeping all appointments with provider. 02-06-2023 with coumadin clinic Provided patient with education about the role of exercise in the management of heart failure Advised patient to discuss any new changes with provider Screening for signs and symptoms of depression related to chronic disease state  Assessed social determinant of health barriers  Patient expressed concerns related to CAP services, RNCM to place referral for SW to assist patient with need.  Patient Goals/Self-Care Activities: Take all medications as prescribed Attend all scheduled provider appointments Call pharmacy for medication refills 3-7 days in advance of running out of medications Attend church or other social activities Perform all self care activities independently  Perform IADL's (shopping, preparing meals, housekeeping, managing finances) independently Call provider office for new concerns or questions  call office if I gain more than 2 pounds in one day or 5 pounds  in one week keep legs up while sitting use salt in moderation watch for  swelling in feet, ankles and legs every day  Follow Up Plan:  Telephone follow up appointment with care management team member scheduled for:  03-05-2023 at 1:00 pm           Our next appointment is by telephone on 03-05-2023 at 1:00 pm  Please call the care guide team at [phone] if you need to cancel or reschedule your appointment.   If you are experiencing a Mental Health or Behavioral Health Crisis or need someone to talk to, please call the Suicide and Crisis Lifeline: 988 call the [place] National Suicide Prevention Lifeline: 949-318-4051 or TTY: (775)729-2237 TTY (513) 332-3674) to talk to a trained counselor call 1-800-273-TALK (toll free, 24 hour hotline) go to [facility] [address], [place] [phone])   The patient verbalized understanding of instructions, educational materials, and care plan provided today and DECLINED offer to receive copy of patient instructions, educational materials, and care plan.   Telephone follow up appointment with care management team member scheduled for:03-05-2023 at 1:00 pm  Rosalene Billings, BSN RN [facility], [facility] Health RN Care Manager Direct Dial: [phone]  Fax: [phone]

## 2023-02-05 NOTE — Patient Outreach (Addendum)
Care Management   Visit Note  02/05/2023 Name: [name] MRN: [id number] DOB: 02/06/1935  Subjective: [name] is a 88 y.o. year old female who is a primary care patient of Nestor Ramp, MD. The Care Management team was consulted for assistance.      Engaged with patient spoke with patient by telephone.   Goals Addressed             This Visit's Progress    RNCM Care Management Expected Outcomes: Monitor, Self-Manage and Reduce Sympomts of: CHF       Current Barriers:  Knowledge Deficits related to plan of care for management of CHF  Care Coordination needs related to Limited access to caregiver Chronic Disease Management support and education needs related to CHF   RNCM Clinical Goal(s):  Patient will verbalize basic understanding of  CHF disease process and self health management plan as evidenced by verbal explanation, recognizing symptoms and lifestyle modifications take all medications exactly as prescribed and will call provider for medication related questions as evidenced by compliance with all medications attend all scheduled medical appointments: with primary care provider and specialist as evidenced by keeping all scheduled appointments demonstrate Improved and Ongoing adherence to prescribed treatment plan for CHF as evidenced by consistent medication compliance, symptom monitoring and continued lifestyle modifications continue to work with RN Care Manager to address care management and care coordination needs related to  CHF as evidenced by adherence to CM Team Scheduled appointments through collaboration with RN Care manager, provider, and care team.   Interventions: Evaluation of current treatment plan related to  self management and patient's adherence to plan as established by provider Wt Readings from Last 3 Encounters:  11/18/22 147 lb 6.4 oz (66.9 kg)  11/11/22 147 lb 6.4 oz (66.9 kg)  10/28/22 150 lb 2 oz (68.1 kg)     Heart Failure Interventions:   (Status:  Goal on track:  Yes.) Long Term Goal Basic overview and discussion of pathophysiology of Heart Failure reviewed. Reports no changing with her hearth health. Denies any swelling. Continues wearing her compression stockings. Provided education on low sodium diet. States she does not use salt or seasonings. Reviewed Heart Failure Action Plan in depth and provided written copy Assessed need for readable accurate scales in home. Reports that she needs batteries. Provided education about placing scale on hard, flat surface Advised patient to weigh each morning after emptying bladder Discussed importance of daily weight and advised patient to weigh and record daily. Has not been weighing daily due to scale needing batteries. RNCM reiterated the importance of daily weighs. Reviewed role of diuretics in prevention of fluid overload and management of heart failure;Marland Kitchen Reports that nephrology has changed her frequency to 3x/week. Discussed the importance of keeping all appointments with provider. 02-06-2023 with coumadin clinic Provided patient with education about the role of exercise in the management of heart failure Advised patient to discuss any new changes with provider Screening for signs and symptoms of depression related to chronic disease state  Assessed social determinant of health barriers  Patient expressed concerns related to CAP services, RNCM to place referral for SW to assist patient with need.  Patient Goals/Self-Care Activities: Take all medications as prescribed Attend all scheduled provider appointments Call pharmacy for medication refills 3-7 days in advance of running out of medications Attend church or other social activities Perform all self care activities independently  Perform IADL's (shopping, preparing meals, housekeeping, managing finances) independently Call provider office for new  concerns or questions  call office if I gain more than 2 pounds in one day or 5  pounds in one week keep legs up while sitting use salt in moderation watch for swelling in feet, ankles and legs every day  Follow Up Plan:  Telephone follow up appointment with care management team member scheduled for:  03-05-2023 at 1:00 pm               Consent to Services:  Patient was given information about care management services, agreed to services, and gave verbal consent to participate.   Plan: Telephone follow up appointment with care management team member scheduled for:03-05-2023 at 1:00 pm  Rosalene Billings, BSN RN [facility], [facility], [facility] RN Care Manager Direct Dial: [phone]  Fax: [phone]

## 2023-02-06 ENCOUNTER — Ambulatory Visit: Payer: 59

## 2023-02-06 ENCOUNTER — Ambulatory Visit: Payer: 59 | Attending: Internal Medicine | Admitting: *Deleted

## 2023-02-06 DIAGNOSIS — Z952 Presence of prosthetic heart valve: Secondary | ICD-10-CM

## 2023-02-06 DIAGNOSIS — Z5181 Encounter for therapeutic drug level monitoring: Secondary | ICD-10-CM

## 2023-02-06 LAB — POCT INR: INR: 1.9 — AB (ref 2.0–3.0)

## 2023-02-06 NOTE — Patient Instructions (Addendum)
Description   Take 2 tablets of warfarin today and then START taking warfarin 1.5 tablets daily except 1 tablet on Tuesdays, Thursdays, and Saturdays.  Recheck INR in 3 weeks.  Remain consistent with your 2 serving of greens per week and your 1 ensure a week.  Coumadin Clinic [phone].

## 2023-02-11 ENCOUNTER — Ambulatory Visit: Payer: Self-pay | Admitting: Licensed Clinical Social Worker

## 2023-02-11 NOTE — Patient Instructions (Signed)
Visit Information  Thank you for taking time to visit with me today. Please don't hesitate to contact me if I can be of assistance to you.   Following are the goals we discussed today:   Goals Addressed             This Visit's Progress    Care Coordination       CAP application completed by LCSW A. Dorreen Valiente and faxed to [facility]  Please use walking devices when ambulating through the home and when outside.  Do not provide any personal or financial information over the phone.  Communicate with trust family member such as daughter for any concerns.         Our next appointment is by telephone on 02/25/2023 at 11am  Please call the care guide team at [phone] if you need to cancel or reschedule your appointment.   If you are experiencing a Mental Health or Behavioral Health Crisis or need someone to talk to, please call 911   Patient verbalizes understanding of instructions and care plan provided today and agrees to view in MyChart. Active MyChart status and patient understanding of how to access instructions and care plan via MyChart confirmed with patient.     Telephone follow up appointment with care management team member scheduled for:02/25/2023  Gwyndolyn Saxon MSW, LCSW Licensed Clinical Social Worker  [facility], Population Health Direct Dial: [phone]  Fax: [phone]

## 2023-02-11 NOTE — Patient Outreach (Signed)
  Care Coordination   Initial Visit Note   02/11/2023 Name: [name] MRN: [id number] DOB: [birth year]  [name] is a 88 y.o. year old female who sees Tami Ramp, MD for primary care. I spoke with  [name] by phone today.  What matters to the patients health and wellness today?  [name] requested information and assistance apply for CAP program with [place] [facility].    Goals Addressed             This Visit's Progress    Care Coordination       CAP application completed by LCSW A. Koren Sermersheim and faxed to [facility]  Please use walking devices when ambulating through the home and when outside.  Do not provide any personal or financial information over the phone.  Communicate with trust family member such as daughter for any concerns.         SDOH assessments and interventions completed:  Yes  SDOH Interventions Today    Flowsheet Row Most Recent Value  SDOH Interventions   Food Insecurity Interventions Intervention Not Indicated  Housing Interventions Intervention Not Indicated  Transportation Interventions Intervention Not Indicated  Utilities Interventions Intervention Not Indicated        Care Coordination Interventions:  Yes, provided  Interventions Today    Flowsheet Row Most Recent Value  General Interventions   General Interventions Discussed/Reviewed Level of Care  Level of Care Applications  [Pt requested assistance with CAP program. Application completed and faxed [place] [facility].]  Mental Health Interventions   Mental Health Discussed/Reviewed Mental Health Reviewed, Coping Strategies  [Provided emotional support and reassurance to [name].]  Safety Interventions   Safety Discussed/Reviewed Safety Discussed, Safety Reviewed  [Encourage pt to not give personal or financial information over the phone. Encourage pt to use walking devices when ambulating in the home.]       Follow up plan: Follow up call scheduled for 02/25/2023    Encounter  Outcome:  Patient Visit Completed   Gwyndolyn Saxon MSW, LCSW Licensed Clinical Social Worker  [facility], Population Health Direct Dial: [phone]  Fax: [phone]

## 2023-02-16 ENCOUNTER — Other Ambulatory Visit: Payer: Self-pay

## 2023-02-18 ENCOUNTER — Encounter: Payer: Self-pay | Admitting: Podiatry

## 2023-02-18 ENCOUNTER — Ambulatory Visit (INDEPENDENT_AMBULATORY_CARE_PROVIDER_SITE_OTHER): Payer: 59 | Admitting: Podiatry

## 2023-02-18 VITALS — Ht 60.0 in

## 2023-02-18 DIAGNOSIS — M79672 Pain in left foot: Secondary | ICD-10-CM | POA: Diagnosis not present

## 2023-02-18 DIAGNOSIS — M79671 Pain in right foot: Secondary | ICD-10-CM | POA: Diagnosis not present

## 2023-02-18 DIAGNOSIS — M79674 Pain in right toe(s): Secondary | ICD-10-CM

## 2023-02-18 DIAGNOSIS — M79675 Pain in left toe(s): Secondary | ICD-10-CM

## 2023-02-18 DIAGNOSIS — B351 Tinea unguium: Secondary | ICD-10-CM

## 2023-02-18 DIAGNOSIS — Z7901 Long term (current) use of anticoagulants: Secondary | ICD-10-CM | POA: Diagnosis not present

## 2023-02-18 DIAGNOSIS — L84 Corns and callosities: Secondary | ICD-10-CM

## 2023-02-18 NOTE — Progress Notes (Signed)
Subjective:  Patient ID: [name], female    DOB: 1935/07/21,  MRN: [id number]  88 y.o. female presents with callus(es) b/l feet and painful thick toenails that are difficult to trim. Painful toenails interfere with ambulation. Aggravating factors include wearing enclosed shoe gear. Pain is relieved with periodic professional debridement. Painful calluses are aggravated when weightbearing with and without shoegear. Pain is relieved with periodic professional debridement.  Patient would like to discuss oral antifungal medication for her onychomycosis. Chief Complaint  Patient presents with   RFC    She is here for a nail trim, PCP is Dr, Jennette Kettle,  She is hard of hearing,      PCP: Nestor Ramp, MD.  New problem(s): None.   Review of Systems: Negative except as noted in the HPI.   Allergies  Allergen Reactions   Iodinated Contrast Media Rash    Pt stated in the past had broken out in a rash on back and itching.    Esomeprazole Magnesium     REACTION: stomach upset, nausea    Objective:  There were no vitals filed for this visit. Constitutional Patient is a pleasant 88 y.o. female WD, WN in NAD. AAO x 3.  Vascular Capillary fill time to digits <3 seconds.  DP/PT pulse(s) are faintly palpable b/l lower extremities. Pedal hair absent b/l. Lower extremity skin temperature gradient warm to cool b/l. No pain with calf compression b/l. No cyanosis or clubbing noted. No ischemia nor gangrene noted b/l.   Neurologic Protective sensation intact 5/5 intact bilaterally with 10g monofilament b/l. Vibratory sensation intact b/l. No clonus b/l.   Dermatologic Pedal skin is thin, shiny and atrophic b/l.  No open wounds b/l lower extremities. No interdigital macerations b/l lower extremities. Toenails 1-5 b/l elongated, discolored, dystrophic, thickened, crumbly with subungual debris and tenderness to dorsal palpation. Hyperkeratotic lesion(s) submet head 2 right foot and submet head 5 b/l.  No  erythema, no edema, no drainage, no fluctuance.  Orthopedic: Normal muscle strength 5/5 to all lower extremity muscle groups bilaterally. HAV with bunion bilaterally and hammertoes 2-5 b/l.    Assessment:   1. Pain due to onychomycosis of toenails of both feet   2. Callus   3. Pain in both feet   4. Chronic anticoagulation    Plan:  Patient was evaluated and treated. All patient's and/or POA's questions/concerns addressed on today's visit. Mycotic toenails 1-5 debrided in length and girth without incident. Continue soft, supportive shoe gear daily. Report any pedal injuries to medical professional. Call office if there are any quesitons/concerns. Her caregiver was present during today's visit. I advised her I would not be prescribing oral antifungals for her onychomycosis. She may discuss with PCP,. We did discuss some topical treatments available. -Callus(es) submet head 2 right foot, submet head 5 left foot, and submet head 5 right foot pared utilizing sterile scalpel blade without complication or incident. Total number debrided =3. -Patient/POA to call should there be question/concern in the interim.  Return in about 3 months (around 05/19/2023).  Freddie Breech, DPM      [facility] LOCATION: [address] [place], [place] [address]  Office [phone]   [facility] LOCATION: [address] [place], [place] [address] Office [phone]

## 2023-02-24 DIAGNOSIS — H04123 Dry eye syndrome of bilateral lacrimal glands: Secondary | ICD-10-CM | POA: Diagnosis not present

## 2023-02-24 DIAGNOSIS — H401133 Primary open-angle glaucoma, bilateral, severe stage: Secondary | ICD-10-CM | POA: Diagnosis not present

## 2023-02-24 DIAGNOSIS — Z961 Presence of intraocular lens: Secondary | ICD-10-CM | POA: Diagnosis not present

## 2023-02-25 ENCOUNTER — Ambulatory Visit: Payer: Self-pay | Admitting: Licensed Clinical Social Worker

## 2023-02-25 NOTE — Patient Outreach (Signed)
  Care Coordination   Follow Up Visit Note   02/25/2023 Name: [name] MRN: [id number] DOB: May 19, 1935  [name] is Tami 88 y.o. year old female who sees Tami Camie CROME, MD for primary care. I spoke with  [name] by phone today.  What matters to the patients health and wellness today?  Completed follow with [name]. LCSW [name] reviewed home safety with patient and follow up regarding PCA request.     Goals Addressed             This Visit's Progress    Care Coordination       CAP application completed by LCSW [name] and faxed to [place] [employer]  Please use walking devices when ambulating through the home and when outside.  Do not provide any personal or financial information over the phone.  Communicate with trust family member such as daughter for any concerns.  Reviewed goals with pt 02/25/2023        SDOH assessments and interventions completed:  Yes  SDOH Interventions Today    Flowsheet Row Most Recent Value  SDOH Interventions   Food Insecurity Interventions Intervention Not Indicated  Housing Interventions Intervention Not Indicated  Transportation Interventions Intervention Not Indicated  Utilities Interventions Intervention Not Indicated        Care Coordination Interventions:  Yes, provided   Interventions Today    Flowsheet Row Most Recent Value  General Interventions   General Interventions Discussed/Reviewed Level of Care  Level of Care Applications  [CAP application faxed 1/22 [name] reports CAP representative has contacted her via letter and phone.]  Applications Other  Education Interventions   Applications Other  Safety Interventions   Safety Discussed/Reviewed Home Safety  [Encouraged [name] to communicate any concerns with her daughter. Do not answer the door or provide financial/person information to unknown individuals]       Follow up plan: Follow up call scheduled for 03/18/2023    Encounter Outcome:  Patient  Visit Completed   [name] MSW, LCSW Licensed Clinical Social Worker  [facility], Population Health Direct Dial: [phone]  Fax: [phone]

## 2023-02-25 NOTE — Patient Instructions (Signed)
 Visit Information  Thank you for taking time to visit with me today. Please don't hesitate to contact me if I can be of assistance to you.   Following are the goals we discussed today:   Goals Addressed             This Visit's Progress    Care Coordination       CAP application completed by LCSW A. Shakeria Robinette and faxed to [place] [employer]  Please use walking devices when ambulating through the home and when outside.  Do not provide any personal or financial information over the phone.  Communicate with trust family member such as daughter for any concerns.  Reviewed goals with pt 02/25/2023        Our next appointment is by telephone on 03/18/2023 at 11am  Please call the care guide team at [phone] if you need to cancel or reschedule your appointment.   If you are experiencing a Mental Health or Behavioral Health Crisis or need someone to talk to, please call 911   Patient verbalizes understanding of instructions and care plan provided today and agrees to view in MyChart. Active MyChart status and patient understanding of how to access instructions and care plan via MyChart confirmed with patient.     Telephone follow up appointment with care management team member scheduled for:03/18/2023  Alfonso Rummer MSW, LCSW Licensed Clinical Social Worker  [facility], Population Health Direct Dial: [phone]  Fax: [phone]

## 2023-02-27 ENCOUNTER — Ambulatory Visit: Payer: 59 | Attending: Cardiology

## 2023-02-27 DIAGNOSIS — Z952 Presence of prosthetic heart valve: Secondary | ICD-10-CM

## 2023-02-27 DIAGNOSIS — Z5181 Encounter for therapeutic drug level monitoring: Secondary | ICD-10-CM | POA: Diagnosis not present

## 2023-02-27 LAB — POCT INR: INR: 2 (ref 2.0–3.0)

## 2023-02-27 NOTE — Patient Instructions (Signed)
 Take 2.5 tablets of warfarin today and then continue taking warfarin 1.5 tablets daily except 1 tablet on Tuesdays, Thursdays, and Saturdays.  Recheck INR in 4 weeks.  Remain consistent with your 2 serving of greens per week and your 1 ensure a week.  Coumadin  Clinic [phone].

## 2023-03-05 ENCOUNTER — Other Ambulatory Visit: Payer: Self-pay | Admitting: *Deleted

## 2023-03-05 NOTE — Patient Outreach (Signed)
Care Management   Visit Note  03/05/2023 Name: [name] MRN: [id number] DOB: [birth year]  Subjective: [name] is a 88 y.o. year old female who is a primary care patient of Nestor Ramp, MD. The Care Management team was consulted for assistance.      Engaged with patient spoke with patient by telephone.    Goals Addressed             This Visit's Progress    RNCM Care Management Expected Outcomes: Monitor, Self-Manage and Reduce Sympomts of: CHF       Current Barriers:  Knowledge Deficits related to plan of care for management of CHF  Care Coordination needs related to Limited access to caregiver Chronic Disease Management support and education needs related to CHF   RNCM Clinical Goal(s):  Patient will verbalize basic understanding of  CHF disease process and self health management plan as evidenced by verbal explanation, recognizing symptoms and lifestyle modifications take all medications exactly as prescribed and will call provider for medication related questions as evidenced by compliance with all medications attend all scheduled medical appointments: with primary care provider and specialist as evidenced by keeping all scheduled appointments demonstrate Improved and Ongoing adherence to prescribed treatment plan for CHF as evidenced by consistent medication compliance, symptom monitoring and continued lifestyle modifications continue to work with RN Care Manager to address care management and care coordination needs related to  CHF as evidenced by adherence to CM Team Scheduled appointments through collaboration with RN Care manager, provider, and care team.   Interventions: Evaluation of current treatment plan related to  self management and patient's adherence to plan as established by provider Wt Readings from Last 3 Encounters:  11/18/22 147 lb 6.4 oz (66.9 kg)  11/11/22 147 lb 6.4 oz (66.9 kg)  10/28/22 150 lb 2 oz (68.1 kg)     Heart Failure  Interventions:  (Status:  Goal on track:  Yes.) Long Term Goal Basic overview and discussion of pathophysiology of Heart Failure reviewed. Reports no changing with her hearth health. Denies any swelling at this time and continues on her diuretics per nephrology recommendations. Continues wearing her compression stockings. Has not been weighing daily, education provided on the importance. Provided education on low sodium diet. States she does not use salt or seasonings. Reviewed Heart Failure Action Plan in depth and provided written copy Assessed need for readable accurate scales in home. Reports that she needs batteries. Provided education about placing scale on hard, flat surface Advised patient to weigh each morning after emptying bladder Discussed importance of daily weight and advised patient to weigh and record daily. Has not been weighing daily due to scale needing batteries. RNCM reiterated the importance of daily weighs. Reviewed role of diuretics in prevention of fluid overload and management of heart failure;Marland Kitchen Reports that nephrology has changed her frequency to 3x/week., MWF Discussed the importance of keeping all appointments with provider.  Provided patient with education about the role of exercise in the management of heart failure Advised patient to discuss any new changes with provider Screening for signs and symptoms of depression related to chronic disease state  Assessed social determinant of health barriers  Patient expressed concerns related to CAP services, RNCM to place referral for SW to assist patient with need. Continues to work with LCSW for caregiver needs  Patient Goals/Self-Care Activities: Take all medications as prescribed Attend all scheduled provider appointments Call pharmacy for medication refills 3-7 days in advance of running out of  medications Attend church or other social activities Perform all self care activities independently  Perform IADL's  (shopping, preparing meals, housekeeping, managing finances) independently Call provider office for new concerns or questions  call office if I gain more than 2 pounds in one day or 5 pounds in one week keep legs up while sitting use salt in moderation watch for swelling in feet, ankles and legs every day  Follow Up Plan:  Telephone follow up appointment with care management team member scheduled for:  04-07-2023 at 1:00 pm           Consent to Services:  Patient was given information about care management services, agreed to services, and gave verbal consent to participate.   Plan: Telephone follow up appointment with care management team member scheduled for:04-07-2023 at 1:00 pm  Larey Brick, BSN RN [facility], [facility] Health RN Care Manager Direct Dial: [phone]  Fax: [phone]

## 2023-03-05 NOTE — Patient Instructions (Signed)
Visit Information  Thank you for taking time to visit with me today. Please don't hesitate to contact me if I can be of assistance to you before our next scheduled telephone appointment.  Following are the goals we discussed today:   Goals Addressed             This Visit's Progress    RNCM Care Management Expected Outcomes: Monitor, Self-Manage and Reduce Sympomts of: CHF       Current Barriers:  Knowledge Deficits related to plan of care for management of CHF  Care Coordination needs related to Limited access to caregiver Chronic Disease Management support and education needs related to CHF   RNCM Clinical Goal(s):  Patient will verbalize basic understanding of  CHF disease process and self health management plan as evidenced by verbal explanation, recognizing symptoms and lifestyle modifications take all medications exactly as prescribed and will call provider for medication related questions as evidenced by compliance with all medications attend all scheduled medical appointments: with primary care provider and specialist as evidenced by keeping all scheduled appointments demonstrate Improved and Ongoing adherence to prescribed treatment plan for CHF as evidenced by consistent medication compliance, symptom monitoring and continued lifestyle modifications continue to work with RN Care Manager to address care management and care coordination needs related to  CHF as evidenced by adherence to CM Team Scheduled appointments through collaboration with RN Care manager, provider, and care team.   Interventions: Evaluation of current treatment plan related to  self management and patient's adherence to plan as established by provider Wt Readings from Last 3 Encounters:  11/18/22 147 lb 6.4 oz (66.9 kg)  11/11/22 147 lb 6.4 oz (66.9 kg)  10/28/22 150 lb 2 oz (68.1 kg)     Heart Failure Interventions:  (Status:  Goal on track:  Yes.) Long Term Goal Basic overview and discussion of  pathophysiology of Heart Failure reviewed. Reports no changing with her hearth health. Denies any swelling at this time and continues on her diuretics per nephrology recommendations. Continues wearing her compression stockings. Has not been weighing daily, education provided on the importance. Provided education on low sodium diet. States she does not use salt or seasonings. Reviewed Heart Failure Action Plan in depth and provided written copy Assessed need for readable accurate scales in home. Reports that she needs batteries. Provided education about placing scale on hard, flat surface Advised patient to weigh each morning after emptying bladder Discussed importance of daily weight and advised patient to weigh and record daily. Has not been weighing daily due to scale needing batteries. RNCM reiterated the importance of daily weighs. Reviewed role of diuretics in prevention of fluid overload and management of heart failure;Marland Kitchen Reports that nephrology has changed her frequency to 3x/week., MWF Discussed the importance of keeping all appointments with provider.  Provided patient with education about the role of exercise in the management of heart failure Advised patient to discuss any new changes with provider Screening for signs and symptoms of depression related to chronic disease state  Assessed social determinant of health barriers  Patient expressed concerns related to CAP services, RNCM to place referral for SW to assist patient with need. Continues to work with LCSW for caregiver needs  Patient Goals/Self-Care Activities: Take all medications as prescribed Attend all scheduled provider appointments Call pharmacy for medication refills 3-7 days in advance of running out of medications Attend church or other social activities Perform all self care activities independently  Perform IADL's (shopping, preparing meals, housekeeping,  managing finances) independently Call provider office for new  concerns or questions  call office if I gain more than 2 pounds in one day or 5 pounds in one week keep legs up while sitting use salt in moderation watch for swelling in feet, ankles and legs every day  Follow Up Plan:  Telephone follow up appointment with care management team member scheduled for:  04-07-2023 at 1:00 pm           Our next appointment is by telephone on 04-07-2023 at 1:00 pm  Please call the care guide team at [phone] if you need to cancel or reschedule your appointment.   If you are experiencing a Mental Health or Behavioral Health Crisis or need someone to talk to, please call the Suicide and Crisis Lifeline: 988 call the [place] National Suicide Prevention Lifeline: 715 154 4433 or TTY: 4056123218 TTY 417-585-5816) to talk to a trained counselor call 1-800-273-TALK (toll free, 24 hour hotline) go to [facility] [address] [place] [phone])   The patient verbalized understanding of instructions, educational materials, and care plan provided today and DECLINED offer to receive copy of patient instructions, educational materials, and care plan.   Telephone follow up appointment with care management team member scheduled for:04-07-2023 at 1:00 pm  Larey Brick, BSN RN [facility], [facility] Health RN Care Manager Direct Dial: [phone]  Fax: [phone]

## 2023-03-18 ENCOUNTER — Ambulatory Visit: Payer: Self-pay | Admitting: Licensed Clinical Social Worker

## 2023-03-18 NOTE — Patient Instructions (Signed)
 Visit Information  Thank you for taking time to visit with me today. Please don't hesitate to contact me if I can be of assistance to you.   Following are the goals we discussed today:   Goals Addressed             This Visit's Progress    Care Coordination       CAP application completed by LCSW A. Yvette Roark and faxed to [facility]  Please use walking devices when ambulating through the home and when outside.  Do not provide any personal or financial information over the phone.  Communicate with trust family member such as daughter for any concerns.  Reviewed goals with pt 03/18/2023        Our next appointment is by telephone on 04/01/2023 at 130pm  Please call the care guide team at [phone] if you need to cancel or reschedule your appointment.   If you are experiencing a Mental Health or Behavioral Health Crisis or need someone to talk to, please call 911   Patient verbalizes understanding of instructions and care plan provided today and agrees to view in MyChart. Active MyChart status and patient understanding of how to access instructions and care plan via MyChart confirmed with patient.     Telephone follow up appointment with care management team member scheduled for:04/01/2023  Gwyndolyn Saxon MSW, LCSW Licensed Clinical Social Worker  [facility], Population Health Direct Dial: [phone]  Fax: [phone]

## 2023-03-18 NOTE — Patient Outreach (Signed)
 Care Coordination   Follow Up Visit Note   03/18/2023 Name: [name] MRN: [id number] DOB: 09/23/1935  [name] is a 88 y.o. year old female who sees Nestor Ramp, MD for primary care. I spoke with  [name] by phone today.  What matters to the patients health and wellness today?  [name] and LCSW A. Felton Clinton completed follow up visit. LCSW A. Jermond Burkemper sent message to pcp for appt request for [name].     Goals Addressed             This Visit's Progress    Care Coordination       CAP application completed by LCSW A. Tekeyah Santiago and faxed to [facility]  Please use walking devices when ambulating through the home and when outside.  Do not provide any personal or financial information over the phone.  Communicate with trust family member such as daughter for any concerns.  Reviewed goals with pt 03/18/2023        SDOH assessments and interventions completed:  Yes  SDOH Interventions Today    Flowsheet Row Most Recent Value  SDOH Interventions   Food Insecurity Interventions Intervention Not Indicated  Housing Interventions Intervention Not Indicated  Transportation Interventions Intervention Not Indicated  Utilities Interventions Intervention Not Indicated        Care Coordination Interventions:  Yes, provided  Interventions Today    Flowsheet Row Most Recent Value  Mental Health Interventions   Mental Health Discussed/Reviewed Mental Health Reviewed, Coping Strategies, Other  [LCSW A. Felton Clinton and [name] discuss engaging in social activities with peers to reduce isolation]       Follow up plan: Follow up call scheduled for 04/01/2023    Encounter Outcome:  Patient Visit Completed   Gwyndolyn Saxon MSW, LCSW Licensed Clinical Social Worker  [facility], Population Health Direct Dial: [phone]  Fax: [phone]

## 2023-03-27 ENCOUNTER — Ambulatory Visit: Payer: 59 | Attending: Internal Medicine

## 2023-03-27 DIAGNOSIS — Z952 Presence of prosthetic heart valve: Secondary | ICD-10-CM

## 2023-03-27 DIAGNOSIS — Z5181 Encounter for therapeutic drug level monitoring: Secondary | ICD-10-CM | POA: Diagnosis not present

## 2023-03-27 LAB — POCT INR: INR: 2.9 (ref 2.0–3.0)

## 2023-03-27 NOTE — Patient Instructions (Signed)
 continue taking warfarin 1.5 tablets daily except 1 tablet on Tuesdays, Thursdays, and Saturdays.  Recheck INR in 6 weeks.  Remain consistent with your 2 serving of greens per week and your 1 ensure a week.  Coumadin Clinic [phone].

## 2023-03-30 ENCOUNTER — Telehealth: Payer: Self-pay

## 2023-03-30 NOTE — Telephone Encounter (Signed)
 Received call from patient and [facility] case manager.   She reports dysuria over the last couple of weeks.   She denies any fevers, chills, abdominal pain, flank pain or hematuria.   Patient advised a clinic visit for evaluation.   Patient scheduled for 3/12 in [facility] for evaluation.

## 2023-04-01 ENCOUNTER — Other Ambulatory Visit: Payer: Self-pay | Admitting: Internal Medicine

## 2023-04-01 ENCOUNTER — Ambulatory Visit (INDEPENDENT_AMBULATORY_CARE_PROVIDER_SITE_OTHER)

## 2023-04-01 ENCOUNTER — Ambulatory Visit: Payer: Self-pay | Admitting: Licensed Clinical Social Worker

## 2023-04-01 VITALS — BP 133/79 | HR 98 | Ht 60.0 in | Wt 150.8 lb

## 2023-04-01 DIAGNOSIS — R300 Dysuria: Secondary | ICD-10-CM | POA: Diagnosis not present

## 2023-04-01 DIAGNOSIS — I482 Chronic atrial fibrillation, unspecified: Secondary | ICD-10-CM

## 2023-04-01 NOTE — Patient Instructions (Addendum)
 It was great to see you! Thank you for allowing me to participate in your care!   Our plans for today:  -Your bladder scan does not show any signs of obstruction -I am sending a urine cup with you-please bring it back within 2 hours of collection-at that time we will look at it and send for culture -I will wait to see what that shows before sending an antibiotic -Please return to urology for the history of blood in urine one time -Return to care if fevers, vomiting, abdominal pain  Take care and seek immediate care sooner if you develop any concerns.  Levin Erp, MD

## 2023-04-01 NOTE — Patient Instructions (Signed)
 Visit Information  Thank you for taking time to visit with me today. Please don't hesitate to contact me if I can be of assistance to you.   Following are the goals we discussed today:   Goals Addressed             This Visit's Progress    Care Coordination       CAP application completed by LCSW A. Archit Leger and faxed to [facility]  Please use walking devices when ambulating through the home and when outside.  Do not provide any personal or financial information over the phone.  Communicate with trust family member such as daughter for any concerns.  Reviewed goals with pt 04/01/2023        Our next appointment is by telephone on 04/15/2023 at 1pm  Please call the care guide team at [phone] if you need to cancel or reschedule your appointment.   If you are experiencing a Mental Health or Behavioral Health Crisis or need someone to talk to, please call 911   Patient verbalizes understanding of instructions and care plan provided today and agrees to view in MyChart. Active MyChart status and patient understanding of how to access instructions and care plan via MyChart confirmed with patient.     Telephone follow up appointment with care management team member scheduled for:04/15/2023  Gwyndolyn Saxon MSW, LCSW Licensed Clinical Social Worker  [facility], Population Health Direct Dial: [phone]  Fax: [phone]

## 2023-04-01 NOTE — Progress Notes (Signed)
    SUBJECTIVE:   CHIEF COMPLAINT / HPI: Dysuria  Phone call 03/30/23 Received call from patient and [facility] case manager.  She reports dysuria over the last couple of weeks.  She denies any fevers, chills, abdominal pain, flank pain or hematuria.   Dysuria- Yes  Frequency- Yes, on lasix and flomax though Urgency- Yes  Hematuria- Yes Maybe saw some blood in urine once Flank pain- No Suprapubic pain- No Fevers- No  Vomiting-No  Ongoing since first week of March Comes and goes Unsure if feels like UTIs she has had in past Hx of bladder outlet obstruction requiring foley catheter in 2023  PERTINENT  PMH / PSH: HTN, atrial fibrillation, diverticular disease of colon, GERD, history of urinary retention  OBJECTIVE:   BP 133/79   Pulse 98   Ht 5' (1.524 m)   Wt 150 lb 12.8 oz (68.4 kg)   SpO2 98%   BMI 29.45 kg/m   General: Well appearing, NAD, awake, alert, responsive to questions Head: Normocephalic atraumatic CV: irregular rhythm, normal rate Respiratory: Clear to ausculation bilaterally, no wheezes rales or crackles, chest rises symmetrically,  no increased work of breathing Abdomen: Soft, non-tender, non-distended, normoactive bowel sounds, no CVA tenderness Extremities: Moves upper and lower extremities freely  ASSESSMENT/PLAN:   Assessment & Plan Dysuria Does have dysuria intermittently alongside frequency, urgency.  Reassuringly bladder scan without obstruction. -UA (Future, patient could not urinate today) -Urine culture (Future, could not urinate today) -Plan to send in cefadroxil after obtaining urine sample -BMP -Follow-up with urology for instance of hematuria which was discussed with daughter and patient -ED/return precautions discussed   Levin Erp, MD [facility] Health [facility]

## 2023-04-01 NOTE — Patient Outreach (Signed)
 Care Coordination   Follow Up Visit Note   04/01/2023 Name: LUSINE CORLETT MRN: [id number] DOB: 10-Jul-1935  [name] is a 88 y.o. year old female who sees Nestor Ramp, MD for primary care. I spoke with  [name] by phone today.  What matters to the patients health and wellness today?  LCSW A Ellanora Rayborn refaxed caps referral     Goals Addressed             This Visit's Progress    Care Coordination       CAP application completed by LCSW A. Brandelyn Henne and faxed to [facility]  Please use walking devices when ambulating through the home and when outside.  Do not provide any personal or financial information over the phone.  Communicate with trust family member such as daughter for any concerns.  Reviewed goals with pt 04/01/2023        SDOH assessments and interventions completed:  No    Already completed  Care Coordination Interventions:  Yes, provided  Interventions Today    Flowsheet Row Most Recent Value  General Interventions   General Interventions Discussed/Reviewed Level of Care  Level of Care Personal Care Services  [refaxed cap referral]       Follow up plan: Follow up call scheduled for 04/15/2023    Encounter Outcome:  Patient Visit Completed   Gwyndolyn Saxon MSW, LCSW Licensed Clinical Social Worker  [facility], Population Health Direct Dial: [phone]  Fax: [phone]

## 2023-04-02 ENCOUNTER — Encounter: Payer: Self-pay | Admitting: Student

## 2023-04-02 LAB — BASIC METABOLIC PANEL
BUN/Creatinine Ratio: 27 (ref 12–28)
BUN: 58 mg/dL — ABNORMAL HIGH (ref 8–27)
CO2: 21 mmol/L (ref 20–29)
Calcium: 9.3 mg/dL (ref 8.7–10.3)
Chloride: 104 mmol/L (ref 96–106)
Creatinine, Ser: 2.16 mg/dL — ABNORMAL HIGH (ref 0.57–1.00)
Glucose: 78 mg/dL (ref 70–99)
Potassium: 3.7 mmol/L (ref 3.5–5.2)
Sodium: 138 mmol/L (ref 134–144)
eGFR: 22 mL/min/{1.73_m2} — ABNORMAL LOW (ref >59)

## 2023-04-02 NOTE — Telephone Encounter (Signed)
 Prescription refill request received for warfarin Lov: 05/26/2022 Next INR check: 4/18 Warfarin tablet strength: 5mg 

## 2023-04-03 ENCOUNTER — Telehealth: Payer: Self-pay | Admitting: Student

## 2023-04-03 DIAGNOSIS — R300 Dysuria: Secondary | ICD-10-CM

## 2023-04-03 LAB — POCT URINALYSIS DIPSTICK
Bilirubin, UA: NEGATIVE
Glucose, UA: NEGATIVE
Ketones, UA: NEGATIVE
Nitrite, UA: NEGATIVE
Protein, UA: POSITIVE — AB
Spec Grav, UA: 1.015 (ref 1.010–1.025)
Urobilinogen, UA: 0.2 U/dL
pH, UA: 5.5 (ref 5.0–8.0)

## 2023-04-03 MED ORDER — CEFADROXIL 500 MG PO CAPS
500.0000 mg | ORAL_CAPSULE | Freq: Two times a day (BID) | ORAL | 0 refills | Status: DC
Start: 2023-04-03 — End: 2023-04-08

## 2023-04-03 NOTE — Telephone Encounter (Signed)
 Called patient and confirmed DOB - discussed some leukocytes in urine but negative nitrite. Will await culture. Until then given symptoms - can start cefadroxil 500 BID for 5 days, will let patient know if culture negative or any resistance -pattern

## 2023-04-06 ENCOUNTER — Other Ambulatory Visit: Payer: Self-pay | Admitting: Family Medicine

## 2023-04-07 ENCOUNTER — Other Ambulatory Visit: Payer: Self-pay | Admitting: *Deleted

## 2023-04-07 LAB — URINE CULTURE

## 2023-04-07 NOTE — Patient Outreach (Signed)
 Care Management   Visit Note  04/07/2023 Name: [name] MRN: [id number] DOB: 10-12-1935  Subjective: [name] is a 88 y.o. year old female who is a primary care patient of Nestor Ramp, MD. The Care Management team was consulted for assistance.      Engaged with patient spoke with patient by telephone.    Goals Addressed             This Visit's Progress    COMPLETED: RNCM Care Management Expected Outcomes: Monitor, Self-Manage and Reduce Sympomts of: CHF       Current Barriers:  Knowledge Deficits related to plan of care for management of CHF  Care Coordination needs related to Limited access to caregiver Chronic Disease Management support and education needs related to CHF   RNCM Clinical Goal(s):  Patient will verbalize basic understanding of  CHF disease process and self health management plan as evidenced by verbal explanation, recognizing symptoms and lifestyle modifications take all medications exactly as prescribed and will call provider for medication related questions as evidenced by compliance with all medications attend all scheduled medical appointments: with primary care provider and specialist as evidenced by keeping all scheduled appointments demonstrate Improved and Ongoing adherence to prescribed treatment plan for CHF as evidenced by consistent medication compliance, symptom monitoring and continued lifestyle modifications continue to work with RN Care Manager to address care management and care coordination needs related to  CHF as evidenced by adherence to CM Team Scheduled appointments through collaboration with RN Care manager, provider, and care team.   Interventions: Evaluation of current treatment plan related to  self management and patient's adherence to plan as established by provider Wt Readings from Last 3 Encounters:  04/01/23 150 lb 12.8 oz (68.4 kg)  11/18/22 147 lb 6.4 oz (66.9 kg)  11/11/22 147 lb 6.4 oz (66.9 kg)     Heart  Failure Interventions:  (Status:  Goal on track:  Yes.) Long Term Goal Basic overview and discussion of pathophysiology of Heart Failure reviewed. Reports no changes with her hearth health. Denies any swelling at this time and continues on her diuretics per nephrology recommendations. Continues wearing her compression stockings. Has not been weighing daily, education provided on the importance. Provided education on low sodium diet. States she does not use salt or seasonings. Reviewed Heart Failure Action Plan in depth and provided written copy Assessed need for readable accurate scales in home.  Provided education about placing scale on hard, flat surface Advised patient to weigh each morning after emptying bladder Discussed importance of daily weight and advised patient to weigh and record daily.  RNCM reiterated the importance of daily weighs. Reviewed role of diuretics in prevention of fluid overload and management of heart failure;Marland Kitchen Reports that nephrology has changed her frequency to 3x/week., MWF Discussed the importance of keeping all appointments with provider.  Provided patient with education about the role of exercise in the management of heart failure Advised patient to discuss any new changes with provider Screening for signs and symptoms of depression related to chronic disease state  Assessed social determinant of health barriers  Patient expressed concerns related to CAP services, RNCM to place referral for SW to assist patient with need. Continues to work with LCSW for caregiver needs Reports that she has a urine culture pending and she was provided with an antibiotic but she is reluctant to take it. RNCM advised that if she had any questions related to contraindications to call the dispensing pharmacy, patient  verbalized full understanding.  Patient Goals/Self-Care Activities: Take all medications as prescribed Attend all scheduled provider appointments Call pharmacy for  medication refills 3-7 days in advance of running out of medications Attend church or other social activities Perform all self care activities independently  Perform IADL's (shopping, preparing meals, housekeeping, managing finances) independently Call provider office for new concerns or questions  call office if I gain more than 2 pounds in one day or 5 pounds in one week keep legs up while sitting use salt in moderation watch for swelling in feet, ankles and legs every day  Follow Up Plan:  No further follow up required: Patient stable. Does not meet criteria for continued complex case management outreaches.            Consent to Services:  Patient was given information about care management services, agreed to services, and gave verbal consent to participate.   Plan: No further follow up required: Patient stable. Does not meet criteria for continued complex case management outreaches  Larey Brick, BSN RN [facility]  [facility], [facility] RN Care Manager Direct Dial: [phone]  Fax: [phone]

## 2023-04-07 NOTE — Patient Instructions (Signed)
 Visit Information  Thank you for taking time to visit with me today. Please don't hesitate to contact me if I can be of assistance to you before our next scheduled telephone appointment.  Following are the goals we discussed today:   Goals Addressed             This Visit's Progress    COMPLETED: RNCM Care Management Expected Outcomes: Monitor, Self-Manage and Reduce Sympomts of: CHF       Current Barriers:  Knowledge Deficits related to plan of care for management of CHF  Care Coordination needs related to Limited access to caregiver Chronic Disease Management support and education needs related to CHF   RNCM Clinical Goal(s):  Patient will verbalize basic understanding of  CHF disease process and self health management plan as evidenced by verbal explanation, recognizing symptoms and lifestyle modifications take all medications exactly as prescribed and will call provider for medication related questions as evidenced by compliance with all medications attend all scheduled medical appointments: with primary care provider and specialist as evidenced by keeping all scheduled appointments demonstrate Improved and Ongoing adherence to prescribed treatment plan for CHF as evidenced by consistent medication compliance, symptom monitoring and continued lifestyle modifications continue to work with RN Care Manager to address care management and care coordination needs related to  CHF as evidenced by adherence to CM Team Scheduled appointments through collaboration with RN Care manager, provider, and care team.   Interventions: Evaluation of current treatment plan related to  self management and patient's adherence to plan as established by provider Wt Readings from Last 3 Encounters:  04/01/23 150 lb 12.8 oz (68.4 kg)  11/18/22 147 lb 6.4 oz (66.9 kg)  11/11/22 147 lb 6.4 oz (66.9 kg)     Heart Failure Interventions:  (Status:  Goal on track:  Yes.) Long Term Goal Basic overview and  discussion of pathophysiology of Heart Failure reviewed. Reports no changes with her hearth health. Denies any swelling at this time and continues on her diuretics per nephrology recommendations. Continues wearing her compression stockings. Has not been weighing daily, education provided on the importance. Provided education on low sodium diet. States she does not use salt or seasonings. Reviewed Heart Failure Action Plan in depth and provided written copy Assessed need for readable accurate scales in home.  Provided education about placing scale on hard, flat surface Advised patient to weigh each morning after emptying bladder Discussed importance of daily weight and advised patient to weigh and record daily.  RNCM reiterated the importance of daily weighs. Reviewed role of diuretics in prevention of fluid overload and management of heart failure;Marland Kitchen Reports that nephrology has changed her frequency to 3x/week., MWF Discussed the importance of keeping all appointments with provider.  Provided patient with education about the role of exercise in the management of heart failure Advised patient to discuss any new changes with provider Screening for signs and symptoms of depression related to chronic disease state  Assessed social determinant of health barriers  Patient expressed concerns related to CAP services, RNCM to place referral for SW to assist patient with need. Continues to work with LCSW for caregiver needs Reports that she has a urine culture pending and she was provided with an antibiotic but she is reluctant to take it. RNCM advised that if she had any questions related to contraindications to call the dispensing pharmacy, patient verbalized full understanding.  Patient Goals/Self-Care Activities: Take all medications as prescribed Attend all scheduled provider appointments Call pharmacy for  medication refills 3-7 days in advance of running out of medications Attend church or other  social activities Perform all self care activities independently  Perform IADL's (shopping, preparing meals, housekeeping, managing finances) independently Call provider office for new concerns or questions  call office if I gain more than 2 pounds in one day or 5 pounds in one week keep legs up while sitting use salt in moderation watch for swelling in feet, ankles and legs every day  Follow Up Plan:  No further follow up required: Patient stable. Does not meet criteria for continued complex case management outreaches.            Please call the care guide team at [phone] if you need to cancel or reschedule your appointment.   If you are experiencing a Mental Health or Behavioral Health Crisis or need someone to talk to, please call the Suicide and Crisis Lifeline: 988 call the [place] National Suicide Prevention Lifeline: 901-315-5383 or TTY: 575-674-1611 TTY 2101652120) to talk to a trained counselor call 1-800-273-TALK (toll free, 24 hour hotline) go to [facility] [address], [place] [phone])   The patient verbalized understanding of instructions, educational materials, and care plan provided today and DECLINED offer to receive copy of patient instructions, educational materials, and care plan.   No further follow up required: Patient stable. Does not meet criteria for continued complex case management outreaches  Larey Brick, BSN RN [facility]  [facility], [facility] RN Care Manager Direct Dial: [phone]  Fax: [phone]

## 2023-04-08 ENCOUNTER — Telehealth: Payer: Self-pay | Admitting: Student

## 2023-04-08 DIAGNOSIS — N3000 Acute cystitis without hematuria: Secondary | ICD-10-CM

## 2023-04-08 MED ORDER — NITROFURANTOIN MONOHYD MACRO 100 MG PO CAPS: 100.0000 mg | ORAL_CAPSULE | Freq: Two times a day (BID) | ORAL | 0 refills | Status: AC

## 2023-04-08 NOTE — Telephone Encounter (Signed)
 Called patient and confirmed DOB. Discussed based on urine culture results stop cefadroxil and start nitrofurantoin that I sent in today. Patient understood. She has follow up with urology tomorrow as well.

## 2023-04-09 DIAGNOSIS — I509 Heart failure, unspecified: Secondary | ICD-10-CM | POA: Diagnosis not present

## 2023-04-09 DIAGNOSIS — N183 Chronic kidney disease, stage 3 unspecified: Secondary | ICD-10-CM | POA: Diagnosis not present

## 2023-04-09 DIAGNOSIS — R339 Retention of urine, unspecified: Secondary | ICD-10-CM | POA: Diagnosis not present

## 2023-04-15 ENCOUNTER — Ambulatory Visit: Payer: Self-pay | Admitting: Licensed Clinical Social Worker

## 2023-04-15 NOTE — Patient Instructions (Signed)
 Visit Information  Thank you for taking time to visit with me today. Please don't hesitate to contact me if I can be of assistance to you.   Following are the goals we discussed today:   Goals Addressed             This Visit's Progress    COMPLETED: Care Coordination       CAP application completed by LCSW A. Zariyah Stephens and faxed to [facility]  Please use walking devices when ambulating through the home and when outside.  Do not provide any personal or financial information over the phone.  Communicate with trust family member such as daughter for any concerns.  Reviewed goals with pt 04/15/2023           Please call the care guide team at [phone] if you need to cancel or reschedule your appointment.   If you are experiencing a Mental Health or Behavioral Health Crisis or need someone to talk to, please call 911   Patient verbalizes understanding of instructions and care plan provided today and agrees to view in MyChart. Active MyChart status and patient understanding of how to access instructions and care plan via MyChart confirmed with patient.     The patient has been provided with contact information for the care management team and has been advised to call with any health related questions or concerns.   Gwyndolyn Saxon MSW, LCSW Licensed Clinical Social Worker  [facility], Population Health Direct Dial: [phone]  Fax: [phone]

## 2023-04-15 NOTE — Patient Outreach (Signed)
 Care Coordination   Follow Up Visit Note   04/15/2023 Name: [name] MRN: [id number] DOB: 05/06/1935  DENAE [name] is a 88 y.o. year old female who sees Nestor Ramp, MD for primary care. I spoke with  [name] by phone today.  What matters to the patients health and wellness today?  Pt reports she is doing well and does not have any immediate social work needs. Pt denies CAP services at this time.     Goals Addressed             This Visit's Progress    COMPLETED: Care Coordination       CAP application completed by LCSW A. Tisa Weisel and faxed to [facility]  Please use walking devices when ambulating through the home and when outside.  Do not provide any personal or financial information over the phone.  Communicate with trust family member such as daughter for any concerns.  Reviewed goals with pt 04/15/2023        SDOH assessments and interventions completed:  Yes  SDOH Interventions Today    Flowsheet Row Most Recent Value  SDOH Interventions   Food Insecurity Interventions Intervention Not Indicated  [Pt reports she has plenty of food. Pt daughter takes her grocery shopping weekly. Pt reports she receives food stamps benefits.]  Utilities Interventions Intervention Not Indicated        Care Coordination Interventions:  Yes, provided  Interventions Today    Flowsheet Row Most Recent Value  General Interventions   General Interventions Discussed/Reviewed Level of Care  Level of Care Personal Care Services  [CAPS application submited twice 3/12 and 2/28. Pt reported 04/15/2023 [facility] L[facility] has contacted her however she declined CAP services because she is not comfortable w indviduals in her home.]  Mental Health Interventions   Mental Health Discussed/Reviewed Mental Health Reviewed  [Pt denies depressed mood and reports daughter visits the home frequently.]  Nutrition Interventions   Nutrition Discussed/Reviewed Nutrition Reviewed  [Pt reports she has plenty  of food. Pt reports daughter takes her grocery shopping weekly and she received food stamps.]       Follow up plan: No further intervention required.   Encounter Outcome:  Patient Visit Completed   Gwyndolyn Saxon MSW, LCSW Licensed Clinical Social Worker  [facility], Population Health Direct Dial: [phone]  Fax: [phone]

## 2023-04-29 ENCOUNTER — Ambulatory Visit: Payer: 59 | Admitting: Family Medicine

## 2023-05-01 ENCOUNTER — Ambulatory Visit: Admitting: Family Medicine

## 2023-05-08 ENCOUNTER — Encounter

## 2023-05-14 ENCOUNTER — Ambulatory Visit

## 2023-05-25 DIAGNOSIS — H401133 Primary open-angle glaucoma, bilateral, severe stage: Secondary | ICD-10-CM | POA: Diagnosis not present

## 2023-05-25 DIAGNOSIS — Z961 Presence of intraocular lens: Secondary | ICD-10-CM | POA: Diagnosis not present

## 2023-05-26 DIAGNOSIS — Z9049 Acquired absence of other specified parts of digestive tract: Secondary | ICD-10-CM | POA: Diagnosis not present

## 2023-05-26 DIAGNOSIS — K7460 Unspecified cirrhosis of liver: Secondary | ICD-10-CM | POA: Diagnosis not present

## 2023-05-27 ENCOUNTER — Other Ambulatory Visit: Payer: Self-pay | Admitting: Family Medicine

## 2023-05-29 ENCOUNTER — Ambulatory Visit: Attending: Internal Medicine | Admitting: *Deleted

## 2023-05-29 DIAGNOSIS — Z5181 Encounter for therapeutic drug level monitoring: Secondary | ICD-10-CM | POA: Diagnosis not present

## 2023-05-29 DIAGNOSIS — Z952 Presence of prosthetic heart valve: Secondary | ICD-10-CM | POA: Diagnosis not present

## 2023-05-29 LAB — POCT INR: INR: 1.5 — AB (ref 2.0–3.0)

## 2023-05-29 NOTE — Patient Instructions (Signed)
 Description   Today take 2 tablets and tomorrow take 2 tablets then continue taking warfarin 1.5 tablets daily except 1 tablet on Tuesdays, Thursdays, and Saturdays.  Recheck INR in 1 week (normally 6 weeks). Remain consistent with your 2 serving of greens per week and your 1 ensure a week.  Coumadin  Clinic [phone].

## 2023-06-05 ENCOUNTER — Ambulatory Visit: Attending: Internal Medicine

## 2023-06-05 DIAGNOSIS — Z5181 Encounter for therapeutic drug level monitoring: Secondary | ICD-10-CM | POA: Diagnosis not present

## 2023-06-05 DIAGNOSIS — Z952 Presence of prosthetic heart valve: Secondary | ICD-10-CM

## 2023-06-05 LAB — POCT INR: INR: 2 (ref 2.0–3.0)

## 2023-06-05 NOTE — Patient Instructions (Signed)
 Today take 2 tablets and tomorrow take 2 tablets then continue taking warfarin 1.5 tablets daily except 1 tablet on Tuesdays, Thursdays, and Saturdays.  Recheck INR in 3 weeks (normally 6 weeks). Remain consistent with your 2 serving of greens per week and your 1 ensure a week.  Coumadin  Clinic [phone].

## 2023-06-24 ENCOUNTER — Ambulatory Visit: Payer: 59 | Admitting: Podiatry

## 2023-06-25 NOTE — Progress Notes (Unsigned)
  Electrophysiology Office Note:   Date:  06/26/2023  ID:  [name], DOB [birth year], MRN [id number]  Primary Cardiologist: Manya Sells, MD Electrophysiologist: Manya Sells, MD      History of Present Illness:   [name] is a 88 y.o. female with h/o MS s/p MVR, chronic AF, and NSVT seen today for routine electrophysiology followup.   Since last being seen in our clinic the patient reports doing well. Mild lightheadedness with rapidly standing, but otherwise denies symptoms. No CP, SOB, or syncope. Mild edema well controlled with lasix  and compression hose.   Review of systems complete and found to be negative unless listed in HPI.   EP Information / Studies Reviewed:    EKG is ordered today. Personal review as below.  EKG Interpretation Date/Time:  Friday June 26 2023 09:07:57 EDT Ventricular Rate:  99 PR Interval:    QRS Duration:  172 QT Interval:  368 QTC Calculation: 472 R Axis:   239  Text Interpretation: Atrial fibrillation with premature ventricular or aberrantly conducted complexes Right bundle branch block , plus right ventricular hypertrophy When compared with ECG of 12-May-2022 05:07, Criteria for Anterior infarct are no longer Present Criteria for Inferior infarct are no longer Present QT has shortened Confirmed by Pilar Bridge [phone]) on 06/26/2023 9:14:05 AM    Arrhythmia/Device History No specialty comments available.   Physical Exam:   VS:  BP (!) 146/101   Pulse 99   Ht 5' (1.524 m)   Wt 149 lb (67.6 kg)   BMI 29.10 kg/m    Wt Readings from Last 3 Encounters:  06/26/23 149 lb (67.6 kg)  04/01/23 150 lb 12.8 oz (68.4 kg)  11/18/22 147 lb 6.4 oz (66.9 kg)     GEN: No acute distress NECK: No JVD; No carotid bruits CARDIAC: Regular rate and rhythm, no murmurs, rubs, gallops RESPIRATORY:  Clear to auscultation without rales, wheezing or rhonchi  ABDOMEN: Soft, non-tender, non-distended EXTREMITIES:  No edema; No deformity   ASSESSMENT AND  PLAN:    Permanent AF EKG today shows AF at 99 bpm. Continue Toprol  Continue coumadin  for CHA2DS2/VASc of at least 5 Denies palpitations  S/p Mechanical AVR Stable valve and normal EF on Echo 03/2022 Labs today on coumadin .   NSVT Continue toprol  100 mg qhs   Follow up with EP Team in 12 months  Signed, Tylene Galla, PA-C

## 2023-06-26 ENCOUNTER — Ambulatory Visit: Attending: Internal Medicine | Admitting: *Deleted

## 2023-06-26 ENCOUNTER — Encounter: Payer: Self-pay | Admitting: Student

## 2023-06-26 ENCOUNTER — Ambulatory Visit: Attending: Student | Admitting: Student

## 2023-06-26 VITALS — BP 146/101 | HR 99 | Ht 60.0 in | Wt 149.0 lb

## 2023-06-26 DIAGNOSIS — Z5181 Encounter for therapeutic drug level monitoring: Secondary | ICD-10-CM | POA: Diagnosis not present

## 2023-06-26 DIAGNOSIS — Z952 Presence of prosthetic heart valve: Secondary | ICD-10-CM

## 2023-06-26 DIAGNOSIS — I482 Chronic atrial fibrillation, unspecified: Secondary | ICD-10-CM | POA: Diagnosis not present

## 2023-06-26 DIAGNOSIS — I10 Essential (primary) hypertension: Secondary | ICD-10-CM | POA: Diagnosis not present

## 2023-06-26 LAB — CBC

## 2023-06-26 LAB — POCT INR: INR: 3.2 — AB (ref 2.0–3.0)

## 2023-06-26 NOTE — Patient Instructions (Signed)
 Description   Continue taking the dose you have been taking, which is, warfarin 1 tablet daily except 1.5 tablet on Monday, Wednesday, and Friday.  Recheck INR in 4 weeks (normally 6 weeks). Remain consistent with your 2 serving of greens per week and your 1 ensure a week.  Coumadin  Clinic [phone].

## 2023-06-26 NOTE — Patient Instructions (Signed)
 Medication Instructions:  No medication changes today. *If you need a refill on your cardiac medications before your next appointment, please call your pharmacy*  Lab Work: Lab Orders         Basic metabolic panel with GFR         CBC    If you have labs (blood work) drawn today and your tests are completely normal, you will receive your results only by: MyChart Message (if you have MyChart) OR A paper copy in the mail If you have any lab test that is abnormal or we need to change your treatment, we will call you to review the results.  Testing/Procedures: No testing ordered today  Follow-Up: At [facility], you and your health needs are our priority.  As part of our continuing mission to provide you with exceptional heart care, our providers are all part of one team.  This team includes your primary Cardiologist (physician) and Advanced Practice Providers or APPs (Physician Assistants and Nurse Practitioners) who all work together to provide you with the care you need, when you need it.  Your next appointment:   11 month(s)  Provider:   You may see Manya Sells, MD or one of the following Advanced Practice Providers on your designated Care Team:   Mertha Abrahams, Kennard Pea "Jonelle Neri" Marina, PA-C Suzann Riddle, NP Creighton Doffing, NP    We recommend signing up for the patient portal called "MyChart".  Sign up information is provided on this After Visit Summary.  MyChart is used to connect with patients for Virtual Visits (Telemedicine).  Patients are able to view lab/test results, encounter notes, upcoming appointments, etc.  Non-urgent messages can be sent to your provider as well.   To learn more about what you can do with MyChart, go to [email or web address].

## 2023-06-27 LAB — CBC
Hematocrit: 33.2 % — ABNORMAL LOW (ref 34.0–46.6)
Hemoglobin: 10.5 g/dL — ABNORMAL LOW (ref 11.1–15.9)
MCH: 27.9 pg (ref 26.6–33.0)
MCHC: 31.6 g/dL (ref 31.5–35.7)
MCV: 88 fL (ref 79–97)
Platelets: 203 10*3/uL (ref 150–450)
RBC: 3.76 x10E6/uL — ABNORMAL LOW (ref 3.77–5.28)
RDW: 12.9 % (ref 11.7–15.4)
WBC: 6.7 10*3/uL (ref 3.4–10.8)

## 2023-06-27 LAB — BASIC METABOLIC PANEL WITH GFR
BUN/Creatinine Ratio: 19 (ref 12–28)
BUN: 46 mg/dL — ABNORMAL HIGH (ref 8–27)
CO2: 22 mmol/L (ref 20–29)
Calcium: 9.3 mg/dL (ref 8.7–10.3)
Chloride: 103 mmol/L (ref 96–106)
Creatinine, Ser: 2.42 mg/dL — ABNORMAL HIGH (ref 0.57–1.00)
Glucose: 80 mg/dL (ref 70–99)
Potassium: 3.6 mmol/L (ref 3.5–5.2)
Sodium: 140 mmol/L (ref 134–144)
eGFR: 19 mL/min/{1.73_m2} — ABNORMAL LOW (ref >59)

## 2023-06-29 ENCOUNTER — Ambulatory Visit: Payer: Self-pay

## 2023-06-29 ENCOUNTER — Other Ambulatory Visit: Payer: Self-pay | Admitting: Internal Medicine

## 2023-06-29 DIAGNOSIS — I482 Chronic atrial fibrillation, unspecified: Secondary | ICD-10-CM

## 2023-07-03 ENCOUNTER — Other Ambulatory Visit: Payer: Self-pay | Admitting: Family Medicine

## 2023-07-27 ENCOUNTER — Encounter

## 2023-07-27 ENCOUNTER — Ambulatory Visit

## 2023-07-27 ENCOUNTER — Telehealth: Payer: Self-pay | Admitting: *Deleted

## 2023-07-27 NOTE — Telephone Encounter (Signed)
 Patient called and states her hand is painful and she will not be able to come today; rescheduled to next available per her availability.

## 2023-07-28 ENCOUNTER — Other Ambulatory Visit: Payer: Self-pay | Admitting: Family Medicine

## 2023-07-28 ENCOUNTER — Ambulatory Visit

## 2023-07-28 DIAGNOSIS — M19042 Primary osteoarthritis, left hand: Secondary | ICD-10-CM | POA: Diagnosis not present

## 2023-08-05 ENCOUNTER — Telehealth: Payer: Self-pay | Admitting: Internal Medicine

## 2023-08-05 ENCOUNTER — Other Ambulatory Visit: Payer: Self-pay

## 2023-08-05 MED ORDER — AMOXICILLIN 500 MG PO CAPS: ORAL_CAPSULE | ORAL | 3 refills | Status: DC

## 2023-08-05 NOTE — Telephone Encounter (Signed)
*  STAT* If patient is at the pharmacy, call can be transferred to refill team.   1. Which medications need to be refilled? (please list name of each medication and dose if known)   amoxicillin  (AMOXIL ) 500 MG capsule   2. Would you like to learn more about the convenience, safety, & potential cost savings by using the [facility]?   3. Are you open to using the [facility] (Type [facility]. ).  4. Which pharmacy/location (including street and city if local pharmacy) is medication to be sent to?  [id number] - [place], [place] - [address] AT [facility] OF [address]   5. Do they need a 30 day or 90 day supply?   Patient stated she is completely out of this medication.

## 2023-08-05 NOTE — Telephone Encounter (Signed)
 Pt of Dr. Waddell. Is this something that Dr. Waddell wants to refill? Please advise

## 2023-08-05 NOTE — Telephone Encounter (Signed)
 RX sent to requested Pharmacy

## 2023-08-07 ENCOUNTER — Encounter

## 2023-08-10 ENCOUNTER — Encounter

## 2023-08-11 ENCOUNTER — Encounter

## 2023-08-14 ENCOUNTER — Telehealth: Payer: Self-pay

## 2023-08-14 NOTE — Telephone Encounter (Signed)
 Brad RN Case Manager calls nurse line requesting an apt for patient. Patient was present on call (three way)  Brad reports the patient reported weakness and poor appetite. She reports symptoms have been present for ~1 week. Patient reports she feels ok just tired. She reports she has been drinking and staying hydrated.   She denies any shortness of breath, chest pains, headaches, dizziness, vision changes, nausea or vomiting.   Discussed with patient her symptoms and advised an evaluation. She reports she does not want to go to the emergency department. She reports fear of being admitted.   I called patients daughter [name] advised close monitoring over the weekend. Patient scheduled for Monday for evaluation.   Strict ED precautions given to both daughter and patient.

## 2023-08-15 ENCOUNTER — Encounter (HOSPITAL_COMMUNITY): Payer: Self-pay | Admitting: Family Medicine

## 2023-08-15 ENCOUNTER — Emergency Department (HOSPITAL_COMMUNITY)

## 2023-08-15 ENCOUNTER — Inpatient Hospital Stay (HOSPITAL_COMMUNITY)
Admission: EM | Admit: 2023-08-15 | Discharge: 2023-08-20 | DRG: 872 | Disposition: A | Discharge status: Home / Self Care | Attending: Family Medicine | Admitting: Family Medicine

## 2023-08-15 DIAGNOSIS — Z9981 Dependence on supplemental oxygen: Secondary | ICD-10-CM

## 2023-08-15 DIAGNOSIS — R0602 Shortness of breath: Secondary | ICD-10-CM | POA: Diagnosis not present

## 2023-08-15 DIAGNOSIS — N319 Neuromuscular dysfunction of bladder, unspecified: Secondary | ICD-10-CM | POA: Diagnosis present

## 2023-08-15 DIAGNOSIS — I130 Hypertensive heart and chronic kidney disease with heart failure and stage 1 through stage 4 chronic kidney disease, or unspecified chronic kidney disease: Secondary | ICD-10-CM | POA: Diagnosis present

## 2023-08-15 DIAGNOSIS — I959 Hypotension, unspecified: Secondary | ICD-10-CM | POA: Diagnosis not present

## 2023-08-15 DIAGNOSIS — R42 Dizziness and giddiness: Secondary | ICD-10-CM | POA: Diagnosis not present

## 2023-08-15 DIAGNOSIS — Z789 Other specified health status: Secondary | ICD-10-CM

## 2023-08-15 DIAGNOSIS — M810 Age-related osteoporosis without current pathological fracture: Secondary | ICD-10-CM | POA: Diagnosis present

## 2023-08-15 DIAGNOSIS — Z91041 Radiographic dye allergy status: Secondary | ICD-10-CM | POA: Diagnosis not present

## 2023-08-15 DIAGNOSIS — Z87891 Personal history of nicotine dependence: Secondary | ICD-10-CM

## 2023-08-15 DIAGNOSIS — N183 Chronic kidney disease, stage 3 unspecified: Secondary | ICD-10-CM | POA: Diagnosis not present

## 2023-08-15 DIAGNOSIS — I4821 Permanent atrial fibrillation: Secondary | ICD-10-CM | POA: Diagnosis present

## 2023-08-15 DIAGNOSIS — R000 Tachycardia, unspecified: Secondary | ICD-10-CM | POA: Diagnosis not present

## 2023-08-15 DIAGNOSIS — Z8249 Family history of ischemic heart disease and other diseases of the circulatory system: Secondary | ICD-10-CM

## 2023-08-15 DIAGNOSIS — A419 Sepsis, unspecified organism: Secondary | ICD-10-CM

## 2023-08-15 DIAGNOSIS — R791 Abnormal coagulation profile: Secondary | ICD-10-CM | POA: Diagnosis present

## 2023-08-15 DIAGNOSIS — Z952 Presence of prosthetic heart valve: Secondary | ICD-10-CM | POA: Diagnosis not present

## 2023-08-15 DIAGNOSIS — N390 Urinary tract infection, site not specified: Secondary | ICD-10-CM | POA: Diagnosis present

## 2023-08-15 DIAGNOSIS — D649 Anemia, unspecified: Secondary | ICD-10-CM

## 2023-08-15 DIAGNOSIS — Z9049 Acquired absence of other specified parts of digestive tract: Secondary | ICD-10-CM

## 2023-08-15 DIAGNOSIS — I420 Dilated cardiomyopathy: Secondary | ICD-10-CM | POA: Diagnosis present

## 2023-08-15 DIAGNOSIS — I482 Chronic atrial fibrillation, unspecified: Secondary | ICD-10-CM | POA: Diagnosis not present

## 2023-08-15 DIAGNOSIS — Z7901 Long term (current) use of anticoagulants: Secondary | ICD-10-CM | POA: Diagnosis not present

## 2023-08-15 DIAGNOSIS — N189 Chronic kidney disease, unspecified: Secondary | ICD-10-CM | POA: Diagnosis present

## 2023-08-15 DIAGNOSIS — R300 Dysuria: Secondary | ICD-10-CM | POA: Diagnosis not present

## 2023-08-15 DIAGNOSIS — E860 Dehydration: Secondary | ICD-10-CM | POA: Diagnosis present

## 2023-08-15 DIAGNOSIS — T83518A Infection and inflammatory reaction due to other urinary catheter, initial encounter: Principal | ICD-10-CM | POA: Diagnosis present

## 2023-08-15 DIAGNOSIS — Z79899 Other long term (current) drug therapy: Secondary | ICD-10-CM

## 2023-08-15 DIAGNOSIS — N179 Acute kidney failure, unspecified: Secondary | ICD-10-CM | POA: Diagnosis present

## 2023-08-15 DIAGNOSIS — Y846 Urinary catheterization as the cause of abnormal reaction of the patient, or of later complication, without mention of misadventure at the time of the procedure: Secondary | ICD-10-CM | POA: Diagnosis present

## 2023-08-15 DIAGNOSIS — N184 Chronic kidney disease, stage 4 (severe): Secondary | ICD-10-CM | POA: Diagnosis present

## 2023-08-15 DIAGNOSIS — D72829 Elevated white blood cell count, unspecified: Secondary | ICD-10-CM | POA: Diagnosis not present

## 2023-08-15 DIAGNOSIS — I517 Cardiomegaly: Secondary | ICD-10-CM | POA: Diagnosis not present

## 2023-08-15 DIAGNOSIS — Z96641 Presence of right artificial hip joint: Secondary | ICD-10-CM | POA: Diagnosis present

## 2023-08-15 DIAGNOSIS — R0600 Dyspnea, unspecified: Secondary | ICD-10-CM | POA: Diagnosis not present

## 2023-08-15 DIAGNOSIS — I4510 Unspecified right bundle-branch block: Secondary | ICD-10-CM | POA: Diagnosis present

## 2023-08-15 DIAGNOSIS — R55 Syncope and collapse: Secondary | ICD-10-CM | POA: Diagnosis not present

## 2023-08-15 DIAGNOSIS — Z823 Family history of stroke: Secondary | ICD-10-CM | POA: Diagnosis not present

## 2023-08-15 DIAGNOSIS — N3281 Overactive bladder: Secondary | ICD-10-CM | POA: Diagnosis present

## 2023-08-15 DIAGNOSIS — I350 Nonrheumatic aortic (valve) stenosis: Secondary | ICD-10-CM | POA: Diagnosis present

## 2023-08-15 DIAGNOSIS — R52 Pain, unspecified: Secondary | ICD-10-CM | POA: Diagnosis not present

## 2023-08-15 DIAGNOSIS — H9190 Unspecified hearing loss, unspecified ear: Secondary | ICD-10-CM | POA: Diagnosis present

## 2023-08-15 DIAGNOSIS — R531 Weakness: Secondary | ICD-10-CM | POA: Diagnosis not present

## 2023-08-15 DIAGNOSIS — I5042 Chronic combined systolic (congestive) and diastolic (congestive) heart failure: Secondary | ICD-10-CM | POA: Diagnosis present

## 2023-08-15 DIAGNOSIS — I4891 Unspecified atrial fibrillation: Secondary | ICD-10-CM | POA: Diagnosis not present

## 2023-08-15 DIAGNOSIS — I10 Essential (primary) hypertension: Secondary | ICD-10-CM | POA: Diagnosis present

## 2023-08-15 DIAGNOSIS — N3000 Acute cystitis without hematuria: Secondary | ICD-10-CM | POA: Diagnosis not present

## 2023-08-15 DIAGNOSIS — K219 Gastro-esophageal reflux disease without esophagitis: Secondary | ICD-10-CM | POA: Diagnosis present

## 2023-08-15 LAB — COMPREHENSIVE METABOLIC PANEL WITH GFR
ALT: 11 U/L (ref 0–44)
AST: 21 U/L (ref 15–41)
Albumin: 2.4 g/dL — ABNORMAL LOW (ref 3.5–5.0)
Alkaline Phosphatase: 46 U/L (ref 38–126)
Anion gap: 12 (ref 5–15)
BUN: 78 mg/dL — ABNORMAL HIGH (ref 8–23)
CO2: 19 mmol/L — ABNORMAL LOW (ref 22–32)
Calcium: 8.3 mg/dL — ABNORMAL LOW (ref 8.9–10.3)
Chloride: 101 mmol/L (ref 98–111)
Creatinine, Ser: 4.16 mg/dL — ABNORMAL HIGH (ref 0.44–1.00)
GFR, Estimated: 10 mL/min — ABNORMAL LOW (ref >60)
Glucose, Bld: 110 mg/dL — ABNORMAL HIGH (ref 70–99)
Potassium: 3.4 mmol/L — ABNORMAL LOW (ref 3.5–5.1)
Sodium: 132 mmol/L — ABNORMAL LOW (ref 135–145)
Total Bilirubin: 0.9 mg/dL (ref 0.0–1.2)
Total Protein: 7.3 g/dL (ref 6.5–8.1)

## 2023-08-15 LAB — CBC WITH DIFFERENTIAL/PLATELET
Abs Immature Granulocytes: 0.12 K/uL — ABNORMAL HIGH (ref 0.00–0.07)
Basophils Absolute: 0 K/uL (ref 0.0–0.1)
Basophils Relative: 0 %
Eosinophils Absolute: 0 K/uL (ref 0.0–0.5)
Eosinophils Relative: 0 %
HCT: 30.9 % — ABNORMAL LOW (ref 36.0–46.0)
Hemoglobin: 9.5 g/dL — ABNORMAL LOW (ref 12.0–15.0)
Immature Granulocytes: 1 %
Lymphocytes Relative: 9 %
Lymphs Abs: 1.4 K/uL (ref 0.7–4.0)
MCH: 26.3 pg (ref 26.0–34.0)
MCHC: 30.7 g/dL (ref 30.0–36.0)
MCV: 85.6 fL (ref 80.0–100.0)
Monocytes Absolute: 0.9 K/uL (ref 0.1–1.0)
Monocytes Relative: 6 %
Neutro Abs: 12.3 K/uL — ABNORMAL HIGH (ref 1.7–7.7)
Neutrophils Relative %: 84 %
Platelets: 245 K/uL (ref 150–400)
RBC: 3.61 MIL/uL — ABNORMAL LOW (ref 3.87–5.11)
RDW: 14.6 % (ref 11.5–15.5)
WBC: 14.7 K/uL — ABNORMAL HIGH (ref 4.0–10.5)
nRBC: 0 % (ref 0.0–0.2)

## 2023-08-15 LAB — URINALYSIS, W/ REFLEX TO CULTURE (INFECTION SUSPECTED)
Bilirubin Urine: NEGATIVE
Glucose, UA: NEGATIVE mg/dL
Ketones, ur: NEGATIVE mg/dL
Nitrite: NEGATIVE
Protein, ur: NEGATIVE mg/dL
Specific Gravity, Urine: 1.011 (ref 1.005–1.030)
pH: 7 (ref 5.0–8.0)

## 2023-08-15 LAB — I-STAT CG4 LACTIC ACID, ED: Lactic Acid, Venous: 1.3 mmol/L (ref 0.5–1.9)

## 2023-08-15 LAB — PROTIME-INR
INR: 7.6 (ref 0.8–1.2)
Prothrombin Time: 67.3 s — ABNORMAL HIGH (ref 11.4–15.2)

## 2023-08-15 LAB — MAGNESIUM: Magnesium: 2 mg/dL (ref 1.7–2.4)

## 2023-08-15 LAB — PHOSPHORUS: Phosphorus: 3.3 mg/dL (ref 2.5–4.6)

## 2023-08-15 MED ORDER — SODIUM CHLORIDE 0.9 % IV BOLUS
1000.0000 mL | Freq: Once | INTRAVENOUS | Status: AC
  Administered 2023-08-15: 1000 mL via INTRAVENOUS

## 2023-08-15 MED ORDER — PANTOPRAZOLE SODIUM 40 MG PO TBEC
40.0000 mg | DELAYED_RELEASE_TABLET | Freq: Every day | ORAL | Status: DC
  Administered 2023-08-15 – 2023-08-20 (×6): 40 mg via ORAL
  Filled 2023-08-15 (×6): qty 1

## 2023-08-15 MED ORDER — SODIUM CHLORIDE 0.9 % IV SOLN
2.0000 g | Freq: Once | INTRAVENOUS | Status: AC
  Administered 2023-08-15: 2 g via INTRAVENOUS
  Filled 2023-08-15: qty 20

## 2023-08-15 MED ORDER — LACTATED RINGERS IV SOLN: INTRAVENOUS | Status: DC

## 2023-08-15 MED ORDER — METOPROLOL TARTRATE 50 MG PO TABS
50.0000 mg | ORAL_TABLET | Freq: Two times a day (BID) | ORAL | Status: DC
  Administered 2023-08-15 – 2023-08-17 (×4): 50 mg via ORAL
  Filled 2023-08-15 (×4): qty 1

## 2023-08-15 MED ORDER — DORZOLAMIDE HCL-TIMOLOL MAL 2-0.5 % OP SOLN
1.0000 [drp] | Freq: Two times a day (BID) | OPHTHALMIC | Status: DC
  Administered 2023-08-15 – 2023-08-20 (×11): 1 [drp] via OPHTHALMIC
  Filled 2023-08-15: qty 10

## 2023-08-15 MED ORDER — HYDRALAZINE HCL 10 MG PO TABS
10.0000 mg | ORAL_TABLET | Freq: Three times a day (TID) | ORAL | Status: DC
Start: 2023-08-15 — End: 2023-08-15
  Filled 2023-08-15: qty 1

## 2023-08-15 MED ORDER — METOPROLOL TARTRATE 5 MG/5ML IV SOLN
5.0000 mg | Freq: Once | INTRAVENOUS | Status: DC
  Filled 2023-08-15: qty 5

## 2023-08-15 MED ORDER — SODIUM CHLORIDE 0.9 % IV BOLUS
500.0000 mL | Freq: Once | INTRAVENOUS | Status: AC
  Administered 2023-08-15: 500 mL via INTRAVENOUS

## 2023-08-15 MED ORDER — METOPROLOL SUCCINATE ER 100 MG PO TB24: 100.0000 mg | ORAL_TABLET | Freq: Every morning | ORAL | Status: DC

## 2023-08-15 MED ORDER — LATANOPROST 0.005 % OP SOLN
1.0000 [drp] | Freq: Every day | OPHTHALMIC | Status: DC
  Administered 2023-08-15 – 2023-08-19 (×5): 1 [drp] via OPHTHALMIC
  Filled 2023-08-15: qty 2.5

## 2023-08-15 MED ORDER — DILTIAZEM HCL-DEXTROSE 125-5 MG/125ML-% IV SOLN (PREMIX)
5.0000 mg/h | INTRAVENOUS | Status: DC
  Administered 2023-08-15: 5 mg/h via INTRAVENOUS
  Administered 2023-08-16: 7.5 mg/h via INTRAVENOUS
  Administered 2023-08-16: 5 mg/h via INTRAVENOUS
  Administered 2023-08-17 – 2023-08-18 (×2): 10 mg/h via INTRAVENOUS
  Filled 2023-08-15 (×5): qty 125

## 2023-08-15 MED ORDER — ACETAMINOPHEN 325 MG PO TABS
650.0000 mg | ORAL_TABLET | Freq: Four times a day (QID) | ORAL | Status: DC | PRN
  Administered 2023-08-15: 650 mg via ORAL
  Filled 2023-08-15: qty 2

## 2023-08-15 MED ORDER — WARFARIN - PHARMACIST DOSING INPATIENT: Freq: Every day | Status: DC

## 2023-08-15 MED ORDER — LACTATED RINGERS IV BOLUS: 1000.0000 mL | Freq: Once | INTRAVENOUS | Status: DC

## 2023-08-15 MED ORDER — POTASSIUM CHLORIDE CRYS ER 20 MEQ PO TBCR
40.0000 meq | EXTENDED_RELEASE_TABLET | Freq: Four times a day (QID) | ORAL | Status: AC
  Administered 2023-08-15 – 2023-08-16 (×2): 40 meq via ORAL
  Filled 2023-08-15 (×2): qty 2

## 2023-08-15 MED ORDER — ISOSORBIDE MONONITRATE ER 30 MG PO TB24
15.0000 mg | ORAL_TABLET | Freq: Every day | ORAL | Status: DC
  Administered 2023-08-15: 15 mg via ORAL
  Filled 2023-08-15: qty 1

## 2023-08-15 NOTE — ED Notes (Signed)
 [name] Daring (Daughter) called asking for a update. Her # is [phone]

## 2023-08-15 NOTE — Consult Note (Addendum)
 CONSULTATION NOTE   [name] Date of Encounter: 08/15/2023 Cardiologist: Danelle Birmingham, MD Electrophysiologist: Danelle Birmingham, MD Advanced Heart Failure: None   Chief Complaint   Weakness  Patient Profile   88 yo female with history of mitral stenosis (s/p mechanical MVR on warfarin), permanent atrial fibrillation and NSVT, followed by Dr.Taylor  HPI   [name] is a 88 y.o. female who is being seen today for the evaluation of afib with RVR at the request of Dr. [name]. This is an 88 year old female with history of mitral stenosis status post mechanical MVR on warfarin, permanent atrial fibrillation and nonsustained VT in the past who is followed by Dr. Cathlyn Birmingham.  She was last seen in the office in June and doing fairly well on Toprol  XL 100 mg nightly for rate control.  She had denied any palpitations.  EKG at that time showed A-fib with a rate of 99.  She now presents with feeling unwell over the past week including generalized weakness and difficulty ambulating.  On arrival she was noted to be in A-fib with RVR and had some urinary discomfort.  Initial lab work is significantly abnormal including a sodium of 132, potassium 3.4, creatinine 4.16, albumin 2.4 with a normal venous lactate at 1.3 and decreased hemoglobin and hematocrit 9.5/30.9.  INR is high at 7.6 and urinalysis is abnormal showing hazy urine with a large leukocytes, nitrite negative and few bacteria.  Chest x-ray was unremarkable.  EKG which was personally reviewed demonstrates atrial fibrillation with rapid ventricular response and right bundle branch block.  She currently denies any chest pain or shortness of breath.  PMHx   Past Medical History:  Diagnosis Date   Asthma    years ago   Chronic atrial fibrillation (HCC)    Dilated cardiomyopathy (HCC)    history of this, now resolved   Dysphagia    Hx   Fracture of tibial plateau 05/21/2015   GERD (gastroesophageal reflux disease)    GI  bleed    Hemorrhoid 04/2014   bleeding   History of blood transfusion    Hypertension    Microcytic anemia    Nonsustained ventricular tachycardia (HCC)    Osteoporosis    Protein in urine    RBBB (right bundle branch block)    Rheumatic mitral valve disease     Past Surgical History:  Procedure Laterality Date   CHOLECYSTECTOMY  2005   COLONOSCOPY     COLONOSCOPY W/ POLYPECTOMY     EYE SURGERY  04/2014   HEMICOLECTOMY  2008   forpolyps   MITOMYCIN  C APPLICATION Right 05/17/2014   Procedure: MITOMYCIN  C APPLICATION;  Surgeon: Gaither Quan, MD;  Location: [facility] OR;  Service: Ophthalmology;  Laterality: Right;   MITRAL VALVE REPLACEMENT  1987   St Jude mechanical valve   RIGHT/LEFT HEART CATH AND CORONARY ANGIOGRAPHY N/A 04/02/2021   Procedure: RIGHT/LEFT HEART CATH AND CORONARY ANGIOGRAPHY;  Surgeon: Cherrie Toribio SAUNDERS, MD;  Location: [facility] INVASIVE CV LAB;  Service: Cardiovascular;  Laterality: N/A;   TOTAL HIP ARTHROPLASTY Right 04/08/2022   Procedure: TOTAL HIP ARTHROPLASTY;  Surgeon: Beverley Evalene BIRCH, MD;  Location: [facility] OR;  Service: Orthopedics;  Laterality: Right;   TRABECULECTOMY Right 05/17/2014   Procedure: TRABECULECTOMY WITH MITOMYCIN  RIGHT EYE;  Surgeon: Gaither Quan, MD;  Location: [facility] OR;  Service: Ophthalmology;  Laterality: Right;   TUBAL LIGATION     US  ECHOCARDIOGRAPHY  02/2008, 03/2006, 06/2004    FAMHx  Family History  Problem Relation Age of Onset   Cancer Father        prostate cancer   Hypertension Mother    Stroke Mother        CVA x 2   Cancer Sister        in the Bladder   Endometriosis Daughter    Stroke Sister    Blindness Sister    Hypertension Sister    Thyroid  disease Daughter    Hypertension Daughter    Hypertension Daughter    Hypertension Daughter    Liver disease Daughter    Colon cancer Neg Hx    Breast cancer Neg Hx     SOCHx    reports that she quit smoking about 38 years ago. Her smoking use included cigarettes. She started smoking  about 53 years ago. She has a 22.5 pack-year smoking history. She has never used smokeless tobacco. She reports that she does not drink alcohol and does not use drugs.  Outpatient Medications   No current facility-administered medications on file prior to encounter.   Current Outpatient Medications on File Prior to Encounter  Medication Sig Dispense Refill   amoxicillin  (AMOXIL ) 500 MG capsule Take 4 capsules (2000 mg) 30 minutes prior to dental work 4 capsule 3   Cholecalciferol  (VITAMIN D ) 2000 units tablet Take 1 tablet (2,000 Units total) by mouth daily. 100 tablet 3   dorzolamidel-timolol  (COSOPT ) 22.3-6.8 MG/ML SOLN ophthalmic solution Place 1 drop into both eyes in the morning and at bedtime.  4   FIBER PO Take 1 tablet by mouth daily as needed (Only for diarrhea).     furosemide  (LASIX ) 40 MG tablet TAKE 1 TABLET(40 MG TOTAL) BY MOUTH 2(TWO) TIMES DAILY. CHANGED BY KIDNEY DOCTOR TO TWICE A DAY (Patient taking differently: Take 40 mg by mouth daily.) 180 tablet 3   hydrALAZINE  (APRESOLINE ) 10 MG tablet TAKE 1 TABLET(10 MG) BY MOUTH THREE TIMES DAILY 270 tablet 3   isosorbide  mononitrate (IMDUR ) 30 MG 24 hr tablet TAKE 1/2 TABLET BY MOUTH DAILY 45 tablet 1   LUMIGAN 0.01 % SOLN Place 1 drop into both eyes at bedtime.  0   metoprolol  succinate (TOPROL -XL) 100 MG 24 hr tablet TAKE 1 TABLET(100 MG) BY MOUTH EVERY EVENING (Patient taking differently: Take 100 mg by mouth in the morning.) 90 tablet 3   pantoprazole  (PROTONIX ) 40 MG tablet TAKE 1 TABLET(40 MG) BY MOUTH DAILY 90 tablet 3   polyethylene glycol (MIRALAX  / GLYCOLAX ) 17 g packet Take 17 g by mouth daily as needed for mild constipation. 14 each 0   warfarin (COUMADIN ) 5 MG tablet TAKE 1 TO 2 TABLETS BY MOUTH DAILY AS DIRECTED BY COUMADIN  CLINIC (Patient taking differently: Take 5-7.5 mg by mouth See admin instructions. Take 1 1/2 tablets (7 1/2 mg)  on Sunday, Monday, Wednesday, and Friday, and one tablet (5 mg) on Tuesday, Thursday,  and Saturday) 120 tablet 0    Inpatient Medications    Scheduled Meds:  dorzolamidel-timolol   1 drop Both Eyes BID   hydrALAZINE   10 mg Oral Q8H   isosorbide  mononitrate  15 mg Oral Daily   latanoprost   1 drop Both Eyes QHS   [START ON 08/16/2023] metoprolol  succinate  100 mg Oral q AM   pantoprazole   40 mg Oral Daily   Warfarin - Pharmacist Dosing Inpatient   Does not apply q1600    Continuous Infusions:  diltiazem  (CARDIZEM ) infusion 5 mg/hr (08/15/23 1417)   lactated ringers   PRN Meds:    ALLERGIES   Allergies  Allergen Reactions   Iodinated Contrast Media Rash    Pt stated in the past had broken out in a rash on back and itching.    Esomeprazole Magnesium      REACTION: stomach upset, nausea    ROS   Pertinent items noted in HPI and remainder of comprehensive ROS otherwise negative.  Vitals   Vitals:   08/15/23 1104 08/15/23 1215 08/15/23 1221 08/15/23 1415  BP: 116/88 107/70  110/75  Pulse: (!) 132 74 (!) 116 (!) 110  Resp: 15 11 (!) 25 (!) 24  Temp: 98.1 F (36.7 C)     TempSrc: Oral     SpO2: 98% 96% 99% 96%   No intake or output data in the 24 hours ending 08/15/23 1503 There were no vitals filed for this visit.  Physical Exam   General appearance: alert and no distress Neck: no carotid bruit, no JVD, and thyroid  not enlarged, symmetric, no tenderness/mass/nodules Lungs: clear to auscultation bilaterally Heart: irregularly irregular rhythm, mid systolic click present, and tachycardic with sharp mechanical valve sounds Abdomen: soft, non-tender; bowel sounds normal; no masses,  no organomegaly Extremities: extremities normal, atraumatic, no cyanosis or edema Pulses: 2+ and symmetric Skin: Dry with poor skin turgor Neurologic: Grossly normal Psych: Pleasant  Labs   Results for orders placed or performed during the hospital encounter of 08/15/23 (from the past 48 hours)  Comprehensive metabolic panel     Status: Abnormal   Collection Time:  08/15/23 11:30 AM  Result Value Ref Range   Sodium 132 (L) 135 - 145 mmol/L   Potassium 3.4 (L) 3.5 - 5.1 mmol/L   Chloride 101 98 - 111 mmol/L   CO2 19 (L) 22 - 32 mmol/L   Glucose, Bld 110 (H) 70 - 99 mg/dL    Comment: Glucose reference range applies only to samples taken after fasting for at least 8 hours.   BUN 78 (H) 8 - 23 mg/dL   Creatinine, Ser 5.83 (H) 0.44 - 1.00 mg/dL   Calcium 8.3 (L) 8.9 - 10.3 mg/dL   Total Protein 7.3 6.5 - 8.1 g/dL   Albumin 2.4 (L) 3.5 - 5.0 g/dL   AST 21 15 - 41 U/L   ALT 11 0 - 44 U/L   Alkaline Phosphatase 46 38 - 126 U/L   Total Bilirubin 0.9 0.0 - 1.2 mg/dL   GFR, Estimated 10 (L) >60 mL/min    Comment: (NOTE) Calculated using the CKD-EPI Creatinine Equation (2021)    Anion gap 12 5 - 15    Comment: Performed at [facility], [address], [place], [place] [address]  CBC with Differential     Status: Abnormal   Collection Time: 08/15/23 11:30 AM  Result Value Ref Range   WBC 14.7 (H) 4.0 - 10.5 K/uL   RBC 3.61 (L) 3.87 - 5.11 MIL/uL   Hemoglobin 9.5 (L) 12.0 - 15.0 g/dL   HCT 69.0 (L) 63.9 - 53.9 %   MCV 85.6 80.0 - 100.0 fL   MCH 26.3 26.0 - 34.0 pg   MCHC 30.7 30.0 - 36.0 g/dL   RDW 85.3 88.4 - 84.4 %   Platelets 245 150 - 400 K/uL   nRBC 0.0 0.0 - 0.2 %   Neutrophils Relative % 84 %   Neutro Abs 12.3 (H) 1.7 - 7.7 K/uL   Lymphocytes Relative 9 %   Lymphs Abs 1.4 0.7 - 4.0 K/uL  Monocytes Relative 6 %   Monocytes Absolute 0.9 0.1 - 1.0 K/uL   Eosinophils Relative 0 %   Eosinophils Absolute 0.0 0.0 - 0.5 K/uL   Basophils Relative 0 %   Basophils Absolute 0.0 0.0 - 0.1 K/uL   Immature Granulocytes 1 %   Abs Immature Granulocytes 0.12 (H) 0.00 - 0.07 K/uL    Comment: Performed at [facility], [address], [place], [place] [address]  Magnesium      Status: None   Collection Time: 08/15/23 11:30 AM  Result Value Ref Range   Magnesium  2.0 1.7 - 2.4 mg/dL    Comment: Performed at [facility], [address] [place], [place] [address]  Phosphorus     Status: None   Collection Time: 08/15/23 11:30 AM  Result Value Ref Range   Phosphorus 3.3 2.5 - 4.6 mg/dL    Comment: Performed at [facility], [address] [place], [place] [address]  Protime-INR     Status: Abnormal   Collection Time: 08/15/23 11:30 AM  Result Value Ref Range   Prothrombin  Time 67.3 (H) 11.4 - 15.2 seconds   INR 7.6 (HH) 0.8 - 1.2    Comment: REPEATED TO VERIFY CRITICAL RESULT CALLED TO, READ BACK BY AND VERIFIED WITH: PAYDEN BRANCHARD RN BY HUGHESCH AT 1241PM [id number] (NOTE) INR goal varies based on device and disease states. Performed at [facility], [address], [place], [place] [address]   Urinalysis, w/ Reflex to Culture (Infection Suspected) -Urine, Clean Catch     Status: Abnormal   Collection Time: 08/15/23  1:11 PM  Result Value Ref Range   Specimen Source URINE, CLEAN CATCH    Color, Urine YELLOW YELLOW   APPearance HAZY (A) CLEAR   Specific Gravity, Urine 1.011 1.005 - 1.030   pH 7.0 5.0 - 8.0   Glucose, UA NEGATIVE NEGATIVE mg/dL   Hgb urine dipstick SMALL (A) NEGATIVE   Bilirubin Urine NEGATIVE NEGATIVE   Ketones, ur NEGATIVE NEGATIVE mg/dL   Protein, ur NEGATIVE NEGATIVE mg/dL   Nitrite NEGATIVE NEGATIVE   Leukocytes,Ua LARGE (A) NEGATIVE   RBC / HPF 0-5 0 - 5 RBC/hpf   WBC, UA 21-50 0 - 5 WBC/hpf    Comment:        Reflex urine culture not performed if WBC <=10, OR if Squamous epithelial cells >5. If Squamous epithelial cells >5 suggest recollection.    Bacteria, UA FEW (A) NONE SEEN   Squamous Epithelial / HPF 6-10 0 - 5 /HPF    Comment: Performed at [facility], [address], [place], [place] [address]  I-Stat Lactic Acid, ED     Status: None   Collection Time: 08/15/23  2:03 PM  Result Value Ref Range   Lactic Acid, Venous 1.3 0.5 - 1.9 mmol/L    ECG   A-fib with RVR and RBBB- Personally Reviewed  Telemetry   A-fib with rapid ventricular response-  Personally Reviewed  Radiology   DG Chest Port 1 View Result Date: 08/15/2023 CLINICAL DATA:  Shortness of breath.  Weakness. EXAM: PORTABLE CHEST 1 VIEW COMPARISON:  05/10/2022 FINDINGS: Stable cardiomegaly. Previous median sternotomy. Both lungs are clear. IMPRESSION: Stable cardiomegaly. No active lung disease. Electronically Signed   By: Norleen DELENA Kil M.D.   On: 08/15/2023 12:16    Cardiac Studies   N/A  Impression   Active Problems:   Hypertension   History of mitral valve replacement - St Jude  mechanical valve   CKD (chronic kidney disease), stage III (HCC)   Chronic atrial fibrillation (HCC)   Chronic combined systolic and diastolic congestive heart failure (HCC)   Atrial fibrillation with RVR (HCC)   Recommendation   Asked to see [name] for management of A-fib with RVR.  This is permanent and the increased ventricular rates are a result of her underlying illness and will improve when she gets better.  She has been placed on diltiazem  in addition to her beta-blocker but heart rates remain elevated.  When I saw her in the room she was only on 7.5 mg/h therefore the diltiazem  drip could be increased if necessary.  Also, she did not take her metoprolol  today. It appears she may have a urinary tract infection and likely is profoundly dehydrated with an increased creatinine to 4.16 from 2.42 in June 2025.  She has other abnormalities in her electrolytes including low sodium, elevated BUN and low albumin.  Potassium was 3.4 which may be contributing to her faster A-fib rate and should be corrected to greater than 4.0.  Magnesium  was normal at 2.0.  Her last echo in 2024 showed normal LVEF.  She would benefit from hydration.  INR was elevated, possibly due to acute illness and will need to be adjusted by the pharmacy service.  She is also anemic and I would recommend considering workup for this given chronic warfarin use.  This is probably also contributing to her afib with RVR. Heart  rate is unlikely to improve much until her underlying medical condition improves.  Currently EKG heart rates ranged between 100 and 130 but generally are coming down indicating an improved trend - again, diltiazem  could be further titrated as BP allows to keep SBP>90.  IVF's are necessary. She says she has not taken her metoprolol  today- will switch to lopressor  50 mg BID and give the initial dose now.  Low suspicion for heart failure, but will obtain an updated echocardiogram, possibly tomorrow if her HR improves.  Thanks for the consultation.  Time Spent Directly with Patient:  I have spent a total of 45 minutes with the patient reviewing hospital notes, telemetry, EKGs, labs and examining the patient as well as establishing an assessment and plan that was discussed personally with the patient.  > 50% of time was spent in direct patient care.  Length of Stay:  LOS: 0 days   Tami KYM Maxcy, MD, [facility], FNLA, FACP  [place]  [facility]  Medical Director of the Advanced Lipid Disorders &  Cardiovascular Risk Reduction Clinic Diplomate of the American Board of Clinical Lipidology Attending Cardiologist  Direct Dial: [phone]  Fax: [phone]  Website:  [email or web address] [name] Amreen Raczkowski 08/15/2023, 3:03 PM

## 2023-08-15 NOTE — Assessment & Plan Note (Signed)
 Urine culture pending.  Started on ceftriaxone 

## 2023-08-15 NOTE — Plan of Care (Signed)
 New admission on IV cardizem  for increased heart rate and on IV abx for infection/UTI

## 2023-08-15 NOTE — Assessment & Plan Note (Signed)
 Will have to follow volume status carefully.  Right now she looks euvolemic

## 2023-08-15 NOTE — ED Provider Notes (Signed)
 [place] EMERGENCY DEPARTMENT AT [facility] Provider Note   CSN: [id number] Arrival date & time: 08/15/23  1051     Patient presents with: Weakness   [name] is a 88 y.o. female.   HPI    88 year old patient with history of valvular disease status post metallic valve placement, chronic A-fib, CKD and hypertension comes in with chief complaint of generalized weakness.  According to the patient, she has been feeling unwell for the last week.  She has generalized weakness, difficulty to ambulate because of it.  Patient lives alone.  Daughter lives nearby and checks on her every day.  Patient noted to be in A-fib with RVR upon arrival.  She denies any chest pain, palpitations, shortness of breath, abdominal pain, nausea, vomiting, diarrhea.  She has had some urinary discomfort.  She has been taking her medications as prescribed.  Today the weakness appeared more profound than usual, therefore she decided to call 911.  Prior to Admission medications   Medication Sig Start Date End Date Taking? Authorizing Provider  amoxicillin  (AMOXIL ) 500 MG capsule Take 4 capsules (2000 mg) 30 minutes prior to dental work 08/05/23   Waddell Danelle ORN, MD  Cholecalciferol  (VITAMIN D ) 2000 units tablet Take 1 tablet (2,000 Units total) by mouth daily. 06/10/17   [name] Camie CROME, MD  dorzolamidel-timolol  (COSOPT ) 22.3-6.8 MG/ML SOLN ophthalmic solution Place 1 drop into both eyes in the morning and at bedtime. 03/02/17   [provider]  FIBER PO Take 1 tablet by mouth daily as needed (Only for diarrhea).    [provider]  furosemide  (LASIX ) 40 MG tablet TAKE 1 TABLET(40 MG TOTAL) BY MOUTH 2(TWO) TIMES DAILY. CHANGED BY KIDNEY DOCTOR TO TWICE A DAY 04/06/23   [name] Camie CROME, MD  hydrALAZINE  (APRESOLINE ) 10 MG tablet TAKE 1 TABLET(10 MG) BY MOUTH THREE TIMES DAILY 01/06/23   [name] Camie CROME, MD  isosorbide  mononitrate (IMDUR ) 30 MG 24 hr tablet TAKE 1/2 TABLET BY MOUTH DAILY 07/03/23    [name] Camie CROME, MD  LUMIGAN 0.01 % SOLN Place 1 drop into both eyes at bedtime. 01/26/17   [provider]  metoprolol  succinate (TOPROL -XL) 100 MG 24 hr tablet TAKE 1 TABLET(100 MG) BY MOUTH EVERY EVENING 05/27/23   [name] Camie CROME, MD  pantoprazole  (PROTONIX ) 40 MG tablet TAKE 1 TABLET(40 MG) BY MOUTH DAILY 12/24/22   [name] Camie CROME, MD  polyethylene glycol (MIRALAX  / GLYCOLAX ) 17 g packet Take 17 g by mouth daily as needed for mild constipation. 05/12/22   Carrion-Carrero, Marlo, MD  senna-docusate (SENOKOT-S) 8.6-50 MG tablet Take 1 tablet by mouth 2 (two) times daily. 04/11/22   Theophilus Pagan, MD  tamsulosin  (FLOMAX ) 0.4 MG CAPS capsule TAKE 1 CAPSULE(0.4 MG) BY MOUTH DAILY 04/06/23   [name] Camie CROME, MD  traMADol  (ULTRAM ) 50 MG tablet TAKE ONE BY MOUTH ONCE OR TWICE A DAY AS NEEDED FOR SEVERE PAIN WITH MAX DOSE 2 TABS A DAY Patient not taking: Reported on 06/26/2023 06/03/22   [name] Camie CROME, MD  warfarin (COUMADIN ) 5 MG tablet TAKE 1 TO 2 TABLETS BY MOUTH DAILY AS DIRECTED BY COUMADIN  CLINIC 06/29/23   Waddell Danelle ORN, MD    Allergies: Iodinated contrast media and Esomeprazole magnesium     Review of Systems  All other systems reviewed and are negative.   Updated Vital Signs BP 107/70   Pulse (!) 116   Temp 98.1 F (36.7 C) (Oral)   Resp (!) 25   SpO2  99%   Physical Exam Vitals and nursing note reviewed.  Constitutional:      Appearance: She is well-developed.  HENT:     Head: Atraumatic.  Cardiovascular:     Rate and Rhythm: Tachycardia present.  Pulmonary:     Effort: Pulmonary effort is normal.  Musculoskeletal:     Cervical back: Normal range of motion and neck supple.  Skin:    General: Skin is warm and dry.  Neurological:     Mental Status: She is alert and oriented to person, place, and time.     (all labs ordered are listed, but only abnormal results are displayed) Labs Reviewed  COMPREHENSIVE METABOLIC PANEL WITH GFR - Abnormal; Notable for the following  components:      Result Value   Sodium 132 (*)    Potassium 3.4 (*)    CO2 19 (*)    Glucose, Bld 110 (*)    BUN 78 (*)    Creatinine, Ser 4.16 (*)    Calcium 8.3 (*)    Albumin 2.4 (*)    GFR, Estimated 10 (*)    All other components within normal limits  CBC WITH DIFFERENTIAL/PLATELET - Abnormal; Notable for the following components:   WBC 14.7 (*)    RBC 3.61 (*)    Hemoglobin 9.5 (*)    HCT 30.9 (*)    Neutro Abs 12.3 (*)    Abs Immature Granulocytes 0.12 (*)    All other components within normal limits  PROTIME-INR - Abnormal; Notable for the following components:   Prothrombin  Time 67.3 (*)    INR 7.6 (*)    All other components within normal limits  RESP PANEL BY RT-PCR (RSV, FLU A&B, COVID)  RVPGX2  CULTURE, BLOOD (ROUTINE X 2)  CULTURE, BLOOD (ROUTINE X 2)  MAGNESIUM   PHOSPHORUS  URINALYSIS, W/ REFLEX TO CULTURE (INFECTION SUSPECTED)  I-STAT CG4 LACTIC ACID, ED    EKG: EKG Interpretation Date/Time:  Saturday August 15 2023 11:03:09 EDT Ventricular Rate:  130 PR Interval:  72 QRS Duration:  122 QT Interval:  298 QTC Calculation: 439 R Axis:   -41  Text Interpretation: Sinus tachycardia Multiform ventricular premature complexes Consider right atrial enlargement Right bundle branch block Probable left ventricular hypertrophy Inferior infarct, acute (LCx) Probable posterior infarct, acute Lateral leads are also involved Confirmed by Charlyn Sora ([id number]) on 08/15/2023 1:20:20 PM  Radiology: [facility] Chest Port 1 View Result Date: 08/15/2023 CLINICAL DATA:  Shortness of breath.  Weakness. EXAM: PORTABLE CHEST 1 VIEW COMPARISON:  05/10/2022 FINDINGS: Stable cardiomegaly. Previous median sternotomy. Both lungs are clear. IMPRESSION: Stable cardiomegaly. No active lung disease. Electronically Signed   By: Norleen DELENA Kil M.D.   On: 08/15/2023 12:16     .Critical Care  Performed by: Charlyn Sora, MD Authorized by: Charlyn Sora, MD   Critical care provider statement:     Critical care time (minutes):  48   Critical care was necessary to treat or prevent imminent or life-threatening deterioration of the following conditions:  Circulatory failure and renal failure   Critical care was time spent personally by me on the following activities:  Development of treatment plan with patient or surrogate, discussions with consultants, evaluation of patient's response to treatment, examination of patient, ordering and review of laboratory studies, ordering and review of radiographic studies, ordering and performing treatments and interventions, pulse oximetry, re-evaluation of patient's condition, review of old charts and obtaining history from patient or surrogate    Medications Ordered in the ED  diltiazem  (CARDIZEM ) 125 mg in dextrose  5% 125 mL (1 mg/mL) infusion (has no administration in time range)  lactated ringers  infusion (has no administration in time range)  cefTRIAXone  (ROCEPHIN ) 2 g in sodium chloride  0.9 % 100 mL IVPB (has no administration in time range)  sodium chloride  0.9 % bolus 1,000 mL (has no administration in time range)  sodium chloride  0.9 % bolus 500 mL (0 mLs Intravenous Stopped 08/15/23 1311)                                    Medical Decision Making Amount and/or Complexity of Data Reviewed Labs: ordered. Radiology: ordered.  Risk Prescription drug management. Decision regarding hospitalization.   This patient presents to the ED with chief complaint(s) of generalized weakness, found to be in A-fib with RVR with pertinent past medical history of persistent A-fib, valve replacement on Coumadin , CKD.The complaint involves an extensive differential diagnosis and also carries with it a high risk of complications and morbidity.    The differential diagnosis includes :  Severe electrolyte abnormality, dehydration, supratherapeutic INR, acute coronary syndrome, severe anemia, occult infection including UTI, worsening renal failure  The  initial plan is to get basic labs.  Patient hemodynamically stable.  She is in A-fib with RVR, but not unstable.  We will start with basic labs, give patient for 100 cc fluid and reassess her.   Additional history obtained: Additional history obtained from EMS  Records reviewed previous admission documents and previous cardiology and primary care notes.  It does not appear that patient is on amiodarone , despite what EMS reported.  Independent labs interpretation:  The following labs were independently interpreted: Patient has white count of 14.7.  She also has worsening renal function.  Creatinine has doubled to 4.16.  BUN is 78.  It is my suspicion, that possibly patient has been in A-fib with RVR leading to cardiorenal syndrome. Clinically, it appears that she is having some urinary discomfort.  UA ordered.  White count is elevated.  We will give her IV antibiotics.  It is also possible that the A-fib was driven by infection.  Plan will be to put patient on diltiazem  drip with permissive rate control, goal heart rate less than 110.  Given worsening renal failure, we will give her another liter of IV fluid.  We will start antibiotics.  Once patient's heart rate is better, she will likely auto diurese, and can be given further Lasix  if needed.  I am hesitant at this point to give her 80 mg of IV Lasix , especially since there is a possibility for infection.  Patient's INR is also over 7.  We will just need to hold her Coumadin  for now.  She is not bleeding, no need for reversal.  Independent visualization and interpretation of imaging: - I independently visualized the following imaging with scope of interpretation limited to determining acute life threatening conditions related to emergency care: X-ray of the chest, which revealed no evidence of profound pulmonary edema or pleural effusion  Treatment and Reassessment: Patient reassessed. She is aware of the plan for admission.   Final  diagnoses:  Atrial fibrillation with RVR (HCC)  AKI (acute kidney injury) (HCC)  Supratherapeutic INR    ED Discharge Orders     None          Charlyn Sora, MD 08/15/23 2119

## 2023-08-15 NOTE — ED Triage Notes (Signed)
 Pt presents to ED via EMS from home. Poor oral intake for the past few days at home. Generalized weakness and inability to stand. Lives alone, has someone come in to take care of her. On amiodarone  at home. Alert and oriented x4. Denies CP and SOB. No fall or injury. On warfarin.

## 2023-08-15 NOTE — Progress Notes (Signed)
 PHARMACY - ANTICOAGULATION CONSULT NOTE  Pharmacy Consult for warfarin Indication: MVR and chronic afib  Allergies  Allergen Reactions   Iodinated Contrast Media Rash    Pt stated in the past had broken out in a rash on back and itching.    Esomeprazole Magnesium      REACTION: stomach upset, nausea    Patient Measurements:    Vital Signs: Temp: 98.1 F (36.7 C) (07/26 1104) Temp Source: Oral (07/26 1104) BP: 107/70 (07/26 1215) Pulse Rate: 116 (07/26 1221)  Labs: Recent Labs    08/15/23 1130  HGB 9.5*  HCT 30.9*  PLT 245  LABPROT 67.3*  INR 7.6*  CREATININE 4.16*    CrCl cannot be calculated (Unknown ideal weight.).   Medical History: Past Medical History:  Diagnosis Date   Asthma    years ago   Chronic atrial fibrillation (HCC)    Dilated cardiomyopathy (HCC)    history of this, now resolved   Dysphagia    Hx   Fracture of tibial plateau 05/21/2015   GERD (gastroesophageal reflux disease)    GI bleed    Hemorrhoid 04/2014   bleeding   History of blood transfusion    Hypertension    Microcytic anemia    Nonsustained ventricular tachycardia (HCC)    Osteoporosis    Protein in urine    RBBB (right bundle branch block)    Rheumatic mitral valve disease      Assessment: 32 YOF presenting with weakness, Hx of afib and mechanical mitral valve replacement on warfarin PTA with INR on admission supratheraputic at 7.6  PTA dosing: 5mg  on Tue/thur/Sat, 7.5mg  all other days  Goal of Therapy:  INR 2.5-3.5 Monitor platelets by anticoagulation protocol: Yes   Plan:  Hold warfarin today Daily INR, s/s bleeding  Dorn Poot, PharmD, [facility] Clinical Pharmacist ED Pharmacist Phone # [phone] 08/15/2023 2:31 PM

## 2023-08-15 NOTE — Assessment & Plan Note (Signed)
 Patient with known chronic atrial fibrillation now with rapid ventricular response, started on Cardizem  drip but no significant improvement yet.  Will contact cardiology for consult.  Continue home metoprolol .  Echocardiogram reviewed.

## 2023-08-15 NOTE — ED Notes (Signed)
 Patient placed on bedpan. Comfort measures provided.

## 2023-08-15 NOTE — Assessment & Plan Note (Signed)
 Currently creatinine elevated to 4.  Consider concurrent UTI plus poor perfusion secondary to A-fib RVR.  Will follow closely.  Hydrate judiciously given her underlying combined heart failure.

## 2023-08-15 NOTE — Assessment & Plan Note (Signed)
 Continue home meds and we are now adding Cardizem  drip for rate control.

## 2023-08-15 NOTE — Assessment & Plan Note (Signed)
 Continue chronic anticoagulation, warfarin per pharmacy

## 2023-08-15 NOTE — Progress Notes (Signed)
 Per pharmacy, no coumadin  dose ordered for today due to INR being too high.

## 2023-08-15 NOTE — ED Notes (Signed)
 RN called 3E to inform that patient is on the way, accepting nurse unavailable for questions at that time.

## 2023-08-15 NOTE — Assessment & Plan Note (Signed)
 Code sepsis alerted in ED in which she was started on ceftriaxone .  Clinically she is not septic, her heart rate elevation is related to her atrial fibrillation with RVR.  Suspect she has urinary tract infection and will leave ceftriaxone  on to cover that.  Will consider sepsis resolved.

## 2023-08-15 NOTE — Sepsis Progress Note (Signed)
 Sepsis protocol is being followed by [facility].

## 2023-08-15 NOTE — Progress Notes (Addendum)
 Patient to room 1 from the ed. MP shows afib  120's to130's. Patient has a cardizem  drip infusing at 7.5ml per hour. Patient oriented to room and bed and remote for bed and tv. Patient is a/0 x4. MAE's x4. No br oken skin just dry and flaky with scars on bilateral lower extremities. No meds with patient. Patient has on a depends and wants to keep it on. Daughter to bring more for patient. Patient called her daughter. Patient is on room air. Skin checked with Garrel PEAK.

## 2023-08-15 NOTE — Plan of Care (Addendum)
 FMTS Interim Progress Note  Seen at bedside with Dr Romelle.   S: Endorses frontal headache, otherwise no pain or other complaints.   O: BP 101/73   Pulse 99   Temp 98.3 F (36.8 C) (Oral)   Resp 20   SpO2 96%   General: No acute distress. Resting comfortably in room. CV: S1/S2 with diastolic murmur. No extra heart sounds.  Pulm: Breathing comfortably on room air. CTAB. No increased WOB. Skin:  Warm, dry. Psych: Pleasant and appropriate.   A/P: Afib w RVR Rate controlled in room with HR 80-90s on diltiazem  7.5 mg/hr.  - Cont diltiazem  drip  - CCM  - Metoprolol  50 BID   HTN Normotensive at this time.  - Cont metoprolol   - Cont holding home Imdur , Hydral, Lasix  given normotensive BP   Headache - Tylenol  PRN    Continue treatment plan as otherwise indicated in FMTS H&P.   Diona Perkins, MD 08/15/2023, 10:05 PM PGY-2, [facility] Family Medicine Service pager [phone]

## 2023-08-15 NOTE — H&P (Signed)
 Hospital Admission History and Physical Service Pager: [phone]  [name] Medical record number: [id number] Date of Birth: 18-Jan-1936 Age: 88 y.o. Gender: female  Primary Care Provider: [name] MD Consultants: Cardiology Code Status: Full code, confirmed with patient Preferred Emergency Contact: Daughter  Chief Complaint: Heart racing  Differential and Medical Decision Making:  [name] is a 88 y.o. female presenting with rapid heart rate that started this morning and 1 week of increased fatigue.. Differential for this patient's presentation of this includes A-fib RVR, anemia, palpitations  Assessment & Plan Atrial fibrillation with RVR (HCC) Chronic atrial fibrillation (HCC) Patient with known chronic atrial fibrillation now with rapid ventricular response, started on Cardizem  drip but no significant improvement yet.  Will contact cardiology for consult.  Continue home metoprolol .  Echocardiogram reviewed. Hypertension Continue home meds and we are now adding Cardizem  drip for rate control. History of mitral valve replacement - St Jude mechanical valve Continue chronic anticoagulation, warfarin per pharmacy  CKD (chronic kidney disease), stage III (HCC) Currently creatinine elevated to 4.  Consider concurrent UTI plus poor perfusion secondary to A-fib RVR.  Will follow closely.  Hydrate judiciously given her underlying combined heart failure. Chronic combined systolic and diastolic congestive heart failure (HCC) Will have to follow volume status carefully.  Right now she looks euvolemic Dysuria Leukocytosis Urine culture pending.  Started on ceftriaxone  Sepsis (HCC) Code sepsis alerted in ED in which she was started on ceftriaxone .  Clinically she is not septic, her heart rate elevation is related to her atrial fibrillation with RVR.  Suspect she has urinary tract infection and will leave ceftriaxone  on to cover that.  Will consider sepsis  resolved.   Chronic and Stable Conditions: Hypertension Chronic anticoagulation for [facility] Jude's mechanical valve Osteoporosis  FEN/GI: Regular diet VTE Prophylaxis: Continue warfarin per pharmacy  Disposition: Telemetry  History of Present Illness:  [name] is a 88 y.o. female presenting with heart racing noted this morning.  She has been feeling more weak over the last 1 week.  Appetites been down a little bit.  Denies chest pain, no fever sweats or chills.  Takes her medicine regularly. So in the ED, she was started on a Cardizem  drip.  Rate remains uncontrolled with heart rate 127-140.  Normal  Review Of Systems: Per HPI with the following additions: Fatigue for the last 1 week.  No fevers, sweats or chills.  Pertinent Past Medical History:  Combined systolic and diastolic heart failure with  ECHO 03/2022 1. Left ventricular ejection fraction, by estimation, is 55%.    - - moderate left ventricular hypertrophy.   - Right ventricular systolic function is moderately reduced. - - - -The right ventricular size is moderately enlarged. There is mildly elevated pulmonary artery systolic pressure.   3. Left atrial size was massively dilated.   4. Right atrial size was severely dilated.   5. The mitral valve has been repaired/replaced. There is a unknown size St. Jude mechanical valve    Hx of ventricular tachycardia History of tibial plateau fracture, right femur fracture, osteoporosis.  Chronic anticoagulation for mechanical valve, St Jude, placed 2010. Remainder reviewed in history tab.   Pertinent Past Surgical History: S/p right hemicolectomy  Remainder reviewed in history tab.  Pertinent Social History: Tobacco use: no Alcohol use: none Other Substance use: none Lives with alone, daughter near by, checks on her daily  Pertinent Family History: None applicable to current Remainder reviewed in history tab.  Important Outpatient Medications: Coumadin , Metoprolol    succinate 100 mg every day Pantoprazole  40 mg every day Imdur  15 daily (she takes one half of a 30 mg tab every day) Hydralazine  10 mg TID Furosemide  40 mg    Objective: BP 107/70   Pulse (!) 116   Temp 98.1 F (36.7 C) (Oral)   Resp (!) 25   SpO2 99%  Exam: Vital signs reviewed. GENERAL: Well-developed, well-nourished, no acute distress. CARDIOVASCULAR: Tachycardic, regular  LUNGS: Clear to auscultation bilaterally, no rales or wheeze. ABDOMEN: Soft positive bowel sounds NEURO: No gross focal neurological deficits. MSK: Movement of extremity x 4.  No edema noted PSYCH: AxOx4. Good eye contact.. No psychomotor retardation or agitation. Appropriate speech fluency and content. Asks and answers questions appropriately. Mood is congruent.   Labs:  CBC BMET  Recent Labs  Lab 08/15/23 1130  WBC 14.7*  HGB 9.5*  HCT 30.9*  PLT 245   Recent Labs  Lab 08/15/23 1130  NA 132*  K 3.4*  CL 101  CO2 19*  BUN 78*  CREATININE 4.16*  GLUCOSE 110*  CALCIUM 8.3*    Pertinent additional labs none  EKG: My own interpretation (not copied from electronic read) Sinus tachycardia, 137,RBBB   Imaging Studies Performed:  Imaging Study (ie. Chest x-ray) Impression from Radiologist: Stable cardiomegaly. Previous median sternotomy. Both lungs are clear.IMPRESSION: Stable cardiomegaly. No active lung disease.  My Interpretation: Cardiomegaly noted, prior sternotomy wires noted, no infiltrate   [name] Camie CROME, MD 08/15/2023, 2:30 PM   FPTS Intern pager: [phone], text pages welcome Secure chat group [facility] Teaching Service

## 2023-08-15 NOTE — Sepsis Progress Note (Signed)
 Elink following code sepsis

## 2023-08-16 ENCOUNTER — Inpatient Hospital Stay (HOSPITAL_COMMUNITY)

## 2023-08-16 DIAGNOSIS — N390 Urinary tract infection, site not specified: Secondary | ICD-10-CM

## 2023-08-16 DIAGNOSIS — I5042 Chronic combined systolic (congestive) and diastolic (congestive) heart failure: Secondary | ICD-10-CM

## 2023-08-16 DIAGNOSIS — I4891 Unspecified atrial fibrillation: Secondary | ICD-10-CM | POA: Diagnosis not present

## 2023-08-16 DIAGNOSIS — N3000 Acute cystitis without hematuria: Secondary | ICD-10-CM

## 2023-08-16 DIAGNOSIS — Z952 Presence of prosthetic heart valve: Secondary | ICD-10-CM | POA: Diagnosis not present

## 2023-08-16 DIAGNOSIS — I482 Chronic atrial fibrillation, unspecified: Secondary | ICD-10-CM

## 2023-08-16 DIAGNOSIS — N183 Chronic kidney disease, stage 3 unspecified: Secondary | ICD-10-CM | POA: Diagnosis not present

## 2023-08-16 LAB — CBC
HCT: 30.4 % — ABNORMAL LOW (ref 36.0–46.0)
HCT: 31.6 % — ABNORMAL LOW (ref 36.0–46.0)
Hemoglobin: 9.2 g/dL — ABNORMAL LOW (ref 12.0–15.0)
Hemoglobin: 9.7 g/dL — ABNORMAL LOW (ref 12.0–15.0)
MCH: 26.4 pg (ref 26.0–34.0)
MCH: 26.1 pg (ref 26.0–34.0)
MCHC: 30.3 g/dL (ref 30.0–36.0)
MCHC: 30.7 g/dL (ref 30.0–36.0)
MCV: 87.1 fL (ref 80.0–100.0)
MCV: 84.9 fL (ref 80.0–100.0)
Platelets: 231 K/uL (ref 150–400)
Platelets: 255 K/uL (ref 150–400)
RBC: 3.49 MIL/uL — ABNORMAL LOW (ref 3.87–5.11)
RBC: 3.72 MIL/uL — ABNORMAL LOW (ref 3.87–5.11)
RDW: 14.7 % (ref 11.5–15.5)
RDW: 14.8 % (ref 11.5–15.5)
WBC: 11.9 K/uL — ABNORMAL HIGH (ref 4.0–10.5)
WBC: 13.4 K/uL — ABNORMAL HIGH (ref 4.0–10.5)
nRBC: 0 % (ref 0.0–0.2)
nRBC: 0 % (ref 0.0–0.2)

## 2023-08-16 LAB — PROTIME-INR
INR: 10.5 (ref 0.8–1.2)
Prothrombin Time: 86.4 s — ABNORMAL HIGH (ref 11.4–15.2)

## 2023-08-16 LAB — MAGNESIUM: Magnesium: 2.2 mg/dL (ref 1.7–2.4)

## 2023-08-16 LAB — BASIC METABOLIC PANEL WITH GFR
Anion gap: 9 (ref 5–15)
BUN: 72 mg/dL — ABNORMAL HIGH (ref 8–23)
CO2: 19 mmol/L — ABNORMAL LOW (ref 22–32)
Calcium: 8.2 mg/dL — ABNORMAL LOW (ref 8.9–10.3)
Chloride: 107 mmol/L (ref 98–111)
Creatinine, Ser: 3.41 mg/dL — ABNORMAL HIGH (ref 0.44–1.00)
GFR, Estimated: 13 mL/min — ABNORMAL LOW (ref >60)
Glucose, Bld: 105 mg/dL — ABNORMAL HIGH (ref 70–99)
Potassium: 4 mmol/L (ref 3.5–5.1)
Sodium: 135 mmol/L (ref 135–145)

## 2023-08-16 LAB — ECHOCARDIOGRAM COMPLETE
AR max vel: 0.71 cm2
AV Area VTI: 0.68 cm2
AV Area mean vel: 0.67 cm2
AV Mean grad: 22.5 mmHg
AV Peak grad: 39.1 mmHg
Ao pk vel: 3.13 m/s
Calc EF: 43.5 %
MV VTI: 1.52 cm2
S' Lateral: 3.1 cm
Single Plane A2C EF: 55.5 %
Single Plane A4C EF: 31.9 %
Weight: 2352.75 [oz_av]

## 2023-08-16 MED ORDER — TAMSULOSIN HCL 0.4 MG PO CAPS
0.4000 mg | ORAL_CAPSULE | Freq: Every day | ORAL | Status: DC
  Administered 2023-08-16 – 2023-08-19 (×4): 0.4 mg via ORAL
  Filled 2023-08-16 (×4): qty 1

## 2023-08-16 MED ORDER — SODIUM CHLORIDE 0.9 % IV SOLN
1.0000 g | INTRAVENOUS | Status: DC
  Administered 2023-08-16 – 2023-08-20 (×5): 1 g via INTRAVENOUS
  Filled 2023-08-16 (×5): qty 10

## 2023-08-16 NOTE — Assessment & Plan Note (Deleted)
 Patient with known chronic atrial fibrillation now with rapid ventricular response, started on Cardizem  drip but no significant improvement yet.  Will contact cardiology for consult.  Continue home metoprolol .  Echocardiogram reviewed.

## 2023-08-16 NOTE — Assessment & Plan Note (Deleted)
 Remains euvolemic on exam.

## 2023-08-16 NOTE — Plan of Care (Signed)
 FMTS Interim Progress Note  S: Patient seen at bedside on overnight rounds. She is resting comfortably in bed. Denies any pain at this time. She expresses some shortness of breath, which she attributes to her heart rate. Patient has not had a bowel movement since being in the hospital.   O: BP 103/70   Pulse 96   Temp 98.7 F (37.1 C)   Resp (!) 32   Wt 66.7 kg   SpO2 98%   BMI 28.72 kg/m   General: awake, alert, NAD, elderly female  Cardiovascular: irregularly irregular, consistent with mechanical valve Respiratory: CTAB, tachypnea   Abdominal: bowel sounds present  Extremities: chronic venous stasis changes noted bilaterally, mild edema bilaterally   A/P: A-fib with RVR Mildly tachycardic to 110 on diltiazem  5 mg/hr.  - continue diltiazem  gtt - continue management per day team   Hypertension Stable. Low-normotensive.  - continue management per day team   History of mitral valve replacement - St. Jude mechanical valve  INR high last night - warfarin being held  [place], Raguel MATSU, DO 08/16/2023, 9:58 PM PGY-1, [facility] Family Medicine Service pager [phone]

## 2023-08-16 NOTE — Assessment & Plan Note (Addendum)
 Rate controlled overnight on diltiazem  7.5mg /hr.  - Appreciate ongoing Cards recs, considering TTE - Cont diltiazem  gtt - Cont metoprolol  50 BID - Cont LR 100ml/hr for additional 24 hours

## 2023-08-16 NOTE — TOC Initial Note (Signed)
 Transition of Care [facility]) - Initial/Assessment Note    Patient Details  Name: [name] MRN: [id number] Date of Birth: [birth year]  Transition of Care [facility]) CM/SW Contact:    Marval Gell, RN Phone Number: 08/16/2023, 11:31 AM  Clinical Narrative:                  Per chart review Patient admitted from home, lives in apartment, alone, daughter checks daily, patient has ambulatory DME at home.  Afib, RVR, UTI.  Anticipate patient will return home at DC with assist of daughter. ICM will follow in progression rounds   Expected Discharge Plan: Home/Self Care Barriers to Discharge: Continued Medical Work up   Patient Goals and CMS Choice            Expected Discharge Plan and Services       Living arrangements for the past 2 months: Apartment                                      Prior Living Arrangements/Services Living arrangements for the past 2 months: Apartment Lives with:: Self                   Activities of Daily Living   ADL Screening (condition at time of admission) Independently performs ADLs?: No Does the patient have a NEW difficulty with bathing/dressing/toileting/self-feeding that is expected to last >3 days?: No Does the patient have a NEW difficulty with getting in/out of bed, walking, or climbing stairs that is expected to last >3 days?: No Does the patient have a NEW difficulty with communication that is expected to last >3 days?: No Is the patient deaf or have difficulty hearing?: Yes Does the patient have difficulty seeing, even when wearing glasses/contacts?: Yes Does the patient have difficulty concentrating, remembering, or making decisions?: No  Permission Sought/Granted                  Emotional Assessment              Admission diagnosis:  AKI (acute kidney injury) (HCC) [N17.9] Atrial fibrillation with RVR (HCC) [I48.91] Supratherapeutic INR [R79.1] Patient Active Problem List   Diagnosis Date Noted    UTI (urinary tract infection) 08/16/2023   Atrial fibrillation with RVR (HCC) 08/15/2023   Dysuria 08/15/2023   Leukocytosis 08/15/2023   Malnutrition of moderate degree (HCC) 06/06/2022   Prolonged QT interval 05/11/2022   Femur fracture, right (HCC) 04/08/2022   Closed nondisplaced fracture of lateral end of right clavicle 04/08/2022   Closed subcapital fracture of right femur (HCC) 04/08/2022   History of urinary retention 09/18/2021   Chronic combined systolic and diastolic congestive heart failure (HCC) 04/17/2021   Diverticular disease of colon 04/25/2020   Gastro-esophageal reflux disease without esophagitis 04/25/2020   History of colonic polyps 04/25/2020   Spinal stenosis 04/25/2020   Weakness 04/25/2020   H/O right hemicolectomy 06/16/2018   Abnormal CT scan, liver 06/16/2018   Chronic atrial fibrillation (HCC)    CKD (chronic kidney disease), stage III (HCC) 05/31/2015   Hx of Tibial plateau fracture 2017 05/22/2015   Long term current use of anticoagulant therapy 05/03/2010   VENTRICULAR TACHYCARDIA 02/16/2008   History of mitral valve replacement - St Jude mechanical valve 02/16/2008   Hypertension 03/19/2006   RHINITIS, ALLERGIC 03/19/2006   Osteoporosis 03/19/2006   PCP:  [name] Camie CROME, MD Pharmacy:   [id number] [place], [place] - [address] AT [facility] OF [address] [place] [address] Phone: [phone] Fax: [phone]     Social Drivers of Health (SDOH) Social History: SDOH Screenings   Food Insecurity: Patient Declined (08/15/2023)  Housing: Low Risk  (08/15/2023)  Transportation Needs: Patient Declined (08/15/2023)  Utilities: Patient Declined (08/15/2023)  Alcohol Screen: Low Risk  (06/30/2022)  Depression (PHQ2-9): Low Risk  (04/01/2023)  Financial Resource Strain: Low Risk  (06/30/2022)  Physical Activity: Inactive (01/06/2023)  Social Connections: Unknown (08/15/2023)  Stress: No  Stress Concern Present (06/30/2022)  Tobacco Use: Medium Risk (08/15/2023)  Health Literacy: Adequate Health Literacy (01/06/2023)   SDOH Interventions:     Readmission Risk Interventions     No data to display

## 2023-08-16 NOTE — Assessment & Plan Note (Deleted)
 Urine culture pending.  Started on ceftriaxone 

## 2023-08-16 NOTE — Assessment & Plan Note (Addendum)
 INR elevated to 10 this morning.  -Per cardiology, hold warfarin and monitor for signs of bleeding -AM CBC

## 2023-08-16 NOTE — Plan of Care (Signed)
  Problem: Coping: Goal: Level of anxiety will decrease Outcome: Progressing   Problem: Activity: Goal: Risk for activity intolerance will decrease Outcome: Not Progressing   

## 2023-08-16 NOTE — Progress Notes (Addendum)
 DAILY PROGRESS NOTE   [name] Date of Encounter: 08/16/2023 Cardiologist: Danelle Birmingham, MD  Chief Complaint   No chest pain  Patient Profile   88 yo female with history of mitral stenosis (s/p mechanical MVR on warfarin), permanent atrial fibrillation and NSVT, followed by Dr.Taylor   Subjective   Overnight coveraged notified of INR 10.5 - not given reversal d/t mechanical MVR.  Creatinine improving today with hydration (4.16 -> 3.41), sodium and potassium are normal. Echo today personally reviewed, shows LVEF 45-50% with LCX territory WMA  -  this appears present on her prior echo. She also had a myoview  in the past which showed a fixed inferior/inferolateral defect, but surprisingly, she had angiographically normal coronaries by cath in 2023.   Objective   Vitals:   08/16/23 0003 08/16/23 0306 08/16/23 0500 08/16/23 0729  BP: 103/66 103/67  130/68  Pulse: 89 80  81  Resp: 16 18  18   Temp: 98.6 F (37 C) 97.6 F (36.4 C)    TempSrc: Oral Axillary    SpO2: 94% 95%  94%  Weight:   66.7 kg     Intake/Output Summary (Last 24 hours) at 08/16/2023 1103 Last data filed at 08/15/2023 2000 Gross per 24 hour  Intake 341.18 ml  Output 275 ml  Net 66.18 ml   Filed Weights   08/16/23 0500  Weight: 66.7 kg    Physical Exam   General appearance: alert and no distress Lungs: clear to auscultation bilaterally Heart: irregularly irregular rhythm Extremities: extremities normal, atraumatic, no cyanosis or edema Neurologic: Grossly normal  Inpatient Medications    Scheduled Meds:  dorzolamide -timolol   1 drop Both Eyes BID   latanoprost   1 drop Both Eyes QHS   metoprolol  tartrate  50 mg Oral BID   pantoprazole   40 mg Oral Daily   Warfarin - Pharmacist Dosing Inpatient   Does not apply q1600    Continuous Infusions:  cefTRIAXone  (ROCEPHIN )  IV     diltiazem  (CARDIZEM ) infusion 7.5 mg/hr (08/16/23 0406)   lactated ringers  100 mL/hr at 08/16/23 0315     PRN Meds: acetaminophen    Labs   Results for orders placed or performed during the hospital encounter of 08/15/23 (from the past 48 hours)  Comprehensive metabolic panel     Status: Abnormal   Collection Time: 08/15/23 11:30 AM  Result Value Ref Range   Sodium 132 (L) 135 - 145 mmol/L   Potassium 3.4 (L) 3.5 - 5.1 mmol/L   Chloride 101 98 - 111 mmol/L   CO2 19 (L) 22 - 32 mmol/L   Glucose, Bld 110 (H) 70 - 99 mg/dL    Comment: Glucose reference range applies only to samples taken after fasting for at least 8 hours.   BUN 78 (H) 8 - 23 mg/dL   Creatinine, Ser 5.83 (H) 0.44 - 1.00 mg/dL   Calcium 8.3 (L) 8.9 - 10.3 mg/dL   Total Protein 7.3 6.5 - 8.1 g/dL   Albumin 2.4 (L) 3.5 - 5.0 g/dL   AST 21 15 - 41 U/L   ALT 11 0 - 44 U/L   Alkaline Phosphatase 46 38 - 126 U/L   Total Bilirubin 0.9 0.0 - 1.2 mg/dL   GFR, Estimated 10 (L) >60 mL/min    Comment: (NOTE) Calculated using the CKD-EPI Creatinine Equation (2021)    Anion gap 12 5 - 15    Comment: Performed at [facility], [address] [place], [place]  [address]  CBC with Differential     Status: Abnormal   Collection Time: 08/15/23 11:30 AM  Result Value Ref Range   WBC 14.7 (H) 4.0 - 10.5 K/uL   RBC 3.61 (L) 3.87 - 5.11 MIL/uL   Hemoglobin 9.5 (L) 12.0 - 15.0 g/dL   HCT 69.0 (L) 63.9 - 53.9 %   MCV 85.6 80.0 - 100.0 fL   MCH 26.3 26.0 - 34.0 pg   MCHC 30.7 30.0 - 36.0 g/dL   RDW 85.3 88.4 - 84.4 %   Platelets 245 150 - 400 K/uL   nRBC 0.0 0.0 - 0.2 %   Neutrophils Relative % 84 %   Neutro Abs 12.3 (H) 1.7 - 7.7 K/uL   Lymphocytes Relative 9 %   Lymphs Abs 1.4 0.7 - 4.0 K/uL   Monocytes Relative 6 %   Monocytes Absolute 0.9 0.1 - 1.0 K/uL   Eosinophils Relative 0 %   Eosinophils Absolute 0.0 0.0 - 0.5 K/uL   Basophils Relative 0 %   Basophils Absolute 0.0 0.0 - 0.1 K/uL   Immature Granulocytes 1 %   Abs Immature Granulocytes 0.12 (H) 0.00 - 0.07 K/uL    Comment: Performed at [facility],  [address] [place], [place] [address]  Magnesium      Status: None   Collection Time: 08/15/23 11:30 AM  Result Value Ref Range   Magnesium  2.0 1.7 - 2.4 mg/dL    Comment: Performed at [facility], [address], [place], [place] [address]  Phosphorus     Status: None   Collection Time: 08/15/23 11:30 AM  Result Value Ref Range   Phosphorus 3.3 2.5 - 4.6 mg/dL    Comment: Performed at [facility], [address] [place], [place] [address]  Protime-INR     Status: Abnormal   Collection Time: 08/15/23 11:30 AM  Result Value Ref Range   Prothrombin  Time 67.3 (H) 11.4 - 15.2 seconds   INR 7.6 (HH) 0.8 - 1.2    Comment: REPEATED TO VERIFY CRITICAL RESULT CALLED TO, READ BACK BY AND VERIFIED WITH: PAYDEN BRANCHARD RN BY HUGHESCH AT 1241PM [id number] (NOTE) INR goal varies based on device and disease states. Performed at [facility], [address] [place], [place] [address]   Urinalysis, w/ Reflex to Culture (Infection Suspected) -Urine, Clean Catch     Status: Abnormal   Collection Time: 08/15/23  1:11 PM  Result Value Ref Range   Specimen Source URINE, CLEAN CATCH    Color, Urine YELLOW YELLOW   APPearance HAZY (A) CLEAR   Specific Gravity, Urine 1.011 1.005 - 1.030   pH 7.0 5.0 - 8.0   Glucose, UA NEGATIVE NEGATIVE mg/dL   Hgb urine dipstick SMALL (A) NEGATIVE   Bilirubin Urine NEGATIVE NEGATIVE   Ketones, ur NEGATIVE NEGATIVE mg/dL   Protein, ur NEGATIVE NEGATIVE mg/dL   Nitrite NEGATIVE NEGATIVE   Leukocytes,Ua LARGE (A) NEGATIVE   RBC / HPF 0-5 0 - 5 RBC/hpf   WBC, UA 21-50 0 - 5 WBC/hpf    Comment:        Reflex urine culture not performed if WBC <=10, OR if Squamous epithelial cells >5. If Squamous epithelial cells >5 suggest recollection.    Bacteria, UA FEW (A) NONE SEEN   Squamous Epithelial / HPF 6-10 0 - 5 /HPF    Comment: Performed at [facility], [address], [place], [place] [address]  Blood Culture (routine  x 2)     Status:  None (Preliminary result)   Collection Time: 08/15/23  1:59 PM   Specimen: BLOOD RIGHT ARM  Result Value Ref Range   Specimen Description BLOOD RIGHT ARM    Special Requests      BOTTLES DRAWN AEROBIC AND ANAEROBIC Blood Culture adequate volume   Culture      NO GROWTH < 24 HOURS Performed at [facility], [address], [place], [place] [address]    Report Status PENDING   I-Stat Lactic Acid, ED     Status: None   Collection Time: 08/15/23  2:03 PM  Result Value Ref Range   Lactic Acid, Venous 1.3 0.5 - 1.9 mmol/L  Blood Culture (routine x 2)     Status: None (Preliminary result)   Collection Time: 08/15/23  4:21 PM   Specimen: BLOOD LEFT ARM  Result Value Ref Range   Specimen Description BLOOD LEFT ARM    Special Requests      BOTTLES DRAWN AEROBIC AND ANAEROBIC Blood Culture adequate volume   Culture      NO GROWTH < 12 HOURS Performed at [facility], [address] [place], [place] [address]    Report Status PENDING   Basic metabolic panel     Status: Abnormal   Collection Time: 08/16/23  3:25 AM  Result Value Ref Range   Sodium 135 135 - 145 mmol/L   Potassium 4.0 3.5 - 5.1 mmol/L   Chloride 107 98 - 111 mmol/L   CO2 19 (L) 22 - 32 mmol/L   Glucose, Bld 105 (H) 70 - 99 mg/dL    Comment: Glucose reference range applies only to samples taken after fasting for at least 8 hours.   BUN 72 (H) 8 - 23 mg/dL   Creatinine, Ser 6.58 (H) 0.44 - 1.00 mg/dL   Calcium 8.2 (L) 8.9 - 10.3 mg/dL   GFR, Estimated 13 (L) >60 mL/min    Comment: (NOTE) Calculated using the CKD-EPI Creatinine Equation (2021)    Anion gap 9 5 - 15    Comment: Performed at [facility], [address] [place], [place] [address]  CBC     Status: Abnormal   Collection Time: 08/16/23  3:25 AM  Result Value Ref Range   WBC 11.9 (H) 4.0 - 10.5 K/uL   RBC 3.49 (L) 3.87 - 5.11 MIL/uL   Hemoglobin 9.2 (L) 12.0 - 15.0 g/dL   HCT 69.5 (L) 63.9 - 53.9 %   MCV 87.1 80.0 - 100.0 fL   MCH  26.4 26.0 - 34.0 pg   MCHC 30.3 30.0 - 36.0 g/dL   RDW 85.2 88.4 - 84.4 %   Platelets 231 150 - 400 K/uL   nRBC 0.0 0.0 - 0.2 %    Comment: Performed at [facility], [address] [place], [place] [address]  Protime-INR     Status: Abnormal   Collection Time: 08/16/23  3:25 AM  Result Value Ref Range   Prothrombin  Time 86.4 (H) 11.4 - 15.2 seconds   INR 10.5 (HH) 0.8 - 1.2    Comment: REPEATED TO VERIFY CRITICAL RESULT CALLED TO, READ BACK BY AND VERIFIED WITH: S.BRISTOW RN 0502 08/16/2023 BY G.GANADEN (NOTE) INR goal varies based on device and disease states. Performed at [facility], [address] [place], [place] [address]   Magnesium      Status: None   Collection Time: 08/16/23  3:25 AM  Result Value Ref Range   Magnesium  2.2 1.7 - 2.4 mg/dL    Comment: Performed at [facility], [address], [place], [place] [address]    ECG   N/A  Telemetry   AFib with CVR - Personally Reviewed  Radiology    ECHOCARDIOGRAM COMPLETE Result Date: 08/16/2023    ECHOCARDIOGRAM REPORT   Patient Name:   ANADALAY MACDONELL Date of Exam: 08/16/2023 Medical Rec #:  [id number]      Height:       60.0 in Accession #:    [id number]     Weight:       147.0 lb Date of Birth:  October 04, 1935      BSA:          1.638 m Patient Age:    87 years       BP:           130/68 mmHg Patient Gender: F              HR:           100 bpm. Exam Location:  Inpatient Procedure: 2D Echo (Both Spectral and Color Flow Doppler were utilized during            procedure). Indications:     atrial fibrillation  History:         Patient has prior history of Echocardiogram examinations, most                  recent 04/05/2022. Cardiomyopathy, Rheumatic heart disease.                  chronic kidney disease, Arrythmias:Atrial Fibrillation and                  RBBB; Risk Factors:Hypertension.                   Mitral Valve: St. Jude valve is present in the mitral position.                  Procedure Date: 62.   Sonographer:     Tinnie Barefoot RDCS Referring Phys:  [id number] VINIE BROCKS Joffrey Kerce Diagnosing Phys: VINIE Maxcy MD IMPRESSIONS  1. Left ventricular ejection fraction, by estimation, is 45 to 50%. The left ventricle has mildly decreased function. The left ventricle demonstrates regional wall motion abnormalities (see scoring diagram/findings for description). There is moderate left ventricular hypertrophy. Left ventricular diastolic function could not be evaluated. There is severe hypokinesis of the left ventricular, basal-mid lateral wall, inferolateral wall and inferior wall.  2. Right ventricular systolic function is mildly reduced. The right ventricular size is normal. There is severely elevated pulmonary artery systolic pressure. The estimated right ventricular systolic pressure is 60.4 mmHg.  3. Left atrial size was severely dilated.  4. Right atrial size was severely dilated.  5. The mitral valve has been repaired/replaced. Trivial mitral valve regurgitation. The mean mitral valve gradient is 5.0 mmHg. There is a St. Jude present in the mitral position. Procedure Date: 47. Echo findings are consistent with normal structure and function of the mitral valve prosthesis.  6. The aortic valve is calcified. There is moderate calcification of the aortic valve. Aortic valve regurgitation is trivial. Moderate to severe aortic valve stenosis. Aortic valve area, by VTI measures 0.68 cm. Aortic valve mean gradient measures 22.5  mmHg. Aortic valve Vmax measures 3.13 m/s. Peak gradient 39.1 mmHg, DI 0.30.  7. The inferior vena cava is normal in  size with <50% respiratory variability, suggesting right atrial pressure of 8 mmHg. Comparison(s): Changes from prior study are noted. 04/05/2022: LVEF 55%, mild to moderate AS - DI 30, MVR MG 2 mmHg. Conclusion(s)/Recommendation(s): Regional wall motion abnormality noted on prior echo - noted fixed inferior/inferolateral defect on prior stress testing, however, coronary anatomy was  normal by prior cath. FINDINGS  Left Ventricle: Left ventricular ejection fraction, by estimation, is 45 to 50%. The left ventricle has mildly decreased function. The left ventricle demonstrates regional wall motion abnormalities. Severe hypokinesis of the left ventricular, basal-mid lateral wall, inferolateral wall and inferior wall. The left ventricular internal cavity size was normal in size. There is moderate left ventricular hypertrophy. Left ventricular diastolic function could not be evaluated due to mitral valve replacement. Left ventricular diastolic function could not be evaluated.  LV Wall Scoring: The entire lateral wall is hypokinetic. Right Ventricle: The right ventricular size is normal. No increase in right ventricular wall thickness. Right ventricular systolic function is mildly reduced. There is severely elevated pulmonary artery systolic pressure. The tricuspid regurgitant velocity is 3.62 m/s, and with an assumed right atrial pressure of 8 mmHg, the estimated right ventricular systolic pressure is 60.4 mmHg. Left Atrium: Left atrial size was severely dilated. Right Atrium: Right atrial size was severely dilated. Pericardium: There is no evidence of pericardial effusion. Mitral Valve: The mitral valve has been repaired/replaced. Trivial mitral valve regurgitation. There is a St. Jude present in the mitral position. Procedure Date: 14. Echo findings are consistent with normal structure and function of the mitral valve prosthesis. MV peak gradient, 10.5 mmHg. The mean mitral valve gradient is 5.0 mmHg with average heart rate of 111 bpm. Tricuspid Valve: The tricuspid valve is grossly normal. Tricuspid valve regurgitation is mild. Aortic Valve: The aortic valve is calcified. There is moderate calcification of the aortic valve. Aortic valve regurgitation is trivial. Moderate to severe aortic stenosis is present. Aortic valve mean gradient measures 22.5 mmHg. Aortic valve peak gradient measures 39.1  mmHg. Aortic valve area, by VTI measures 0.68 cm. Pulmonic Valve: The pulmonic valve was normal in structure. Pulmonic valve regurgitation is not visualized. Aorta: The aortic root and ascending aorta are structurally normal, with no evidence of dilitation. Venous: The inferior vena cava is normal in size with less than 50% respiratory variability, suggesting right atrial pressure of 8 mmHg. IAS/Shunts: No atrial level shunt detected by color flow Doppler.  LEFT VENTRICLE PLAX 2D LVIDd:         4.30 cm LVIDs:         3.10 cm LV PW:         1.10 cm LV IVS:        1.10 cm LVOT diam:     1.70 cm LV SV:         34 LV SV Index:   21 LVOT Area:     2.27 cm  LV Volumes (MOD) LV vol d, MOD A2C: 75.5 ml LV vol d, MOD A4C: 88.5 ml LV vol s, MOD A2C: 33.6 ml LV vol s, MOD A4C: 60.3 ml LV SV MOD A2C:     42.0 ml LV SV MOD A4C:     88.5 ml LV SV MOD BP:      38.4 ml RIGHT VENTRICLE          IVC RV Basal diam:  4.00 cm  IVC diam: 1.70 cm TAPSE (M-mode): 1.3 cm LEFT ATRIUM  Index         RIGHT ATRIUM           Index LA diam:      5.00 cm  3.05 cm/m    RA Area:     39.80 cm LA Vol (A4C): 182.5 ml 111.42 ml/m  RA Volume:   172.00 ml 105.01 ml/m  AORTIC VALVE AV Area (Vmax):    0.71 cm AV Area (Vmean):   0.67 cm AV Area (VTI):     0.68 cm AV Vmax:           312.75 cm/s AV Vmean:          219.250 cm/s AV VTI:            0.497 m AV Peak Grad:      39.1 mmHg AV Mean Grad:      22.5 mmHg LVOT Vmax:         97.93 cm/s LVOT Vmean:        65.125 cm/s LVOT VTI:          0.149 m LVOT/AV VTI ratio: 0.30  AORTA Ao Asc diam: 3.10 cm MITRAL VALVE              TRICUSPID VALVE MV Area VTI:  1.52 cm    TR Peak grad:   52.4 mmHg MV Peak grad: 10.5 mmHg   TR Vmax:        362.00 cm/s MV Mean grad: 5.0 mmHg MV Vmax:      1.62 m/s    SHUNTS MV Vmean:     106.8 cm/s  Systemic VTI:  0.15 m                           Systemic Diam: 1.70 cm Vinie Maxcy MD Electronically signed by Vinie Maxcy MD Signature Date/Time: 08/16/2023/10:50:57  AM    Final (Updated)    DG Chest Port 1 View Result Date: 08/15/2023 CLINICAL DATA:  Shortness of breath.  Weakness. EXAM: PORTABLE CHEST 1 VIEW COMPARISON:  05/10/2022 FINDINGS: Stable cardiomegaly. Previous median sternotomy. Both lungs are clear. IMPRESSION: Stable cardiomegaly. No active lung disease. Electronically Signed   By: Norleen DELENA Kil M.D.   On: 08/15/2023 12:16    Cardiac Studies   See echo above  Assessment   Active Problems:   Hypertension   History of mitral valve replacement - St Jude mechanical valve   CKD (chronic kidney disease), stage III (HCC)   Chronic atrial fibrillation (HCC)   Chronic combined systolic and diastolic congestive heart failure (HCC)   Atrial fibrillation with RVR (HCC)   Dysuria   Leukocytosis   UTI (urinary tract infection)   Plan   HR improved today- INR was high last night, warfarin being held. I personally reviewed her echo today- her LVEF is probably in the 45-50% range, severe biatrial enlargement. There are LCX territory [facility], but these appear old (note prior myoview  and cath). She has not had chest pain. Seems improved with hydration and treatment of UTI. Would be careful not to volume overload as she has a pulmonary pressure of 60 mmHg on echo and RA pressure of 8 mmHg.  Time Spent Directly with Patient:  I have spent a total of 25 minutes with the patient reviewing hospital notes, telemetry, EKGs, labs and examining the patient as well as establishing an assessment and plan that was discussed personally with the patient.  > 50% of time was spent in direct  patient care.  Length of Stay:  LOS: 1 day   Vinie KYM Maxcy, MD, [facility], FNLA, FACP  [place]  [facility]  Medical Director of the Advanced Lipid Disorders &  Cardiovascular Risk Reduction Clinic Diplomate of the American Board of Clinical Lipidology Attending Cardiologist  Direct Dial: [phone]  Fax: [phone]  Website:  [email or web address] Vinie BROCKS  Kayan Blissett 08/16/2023, 11:03 AM

## 2023-08-16 NOTE — Hospital Course (Signed)
 CHINARA HERTZBERG is a 88 y.o.female with a history of A-fib, CKD, CHF, mitral valve replacement who was admitted to the [facility] at [facility] for A-fib with RVR with a concomitant UTI. Her hospital course is detailed below:  A-fib with RVR Patient was put on a diltiazem  drip. Cardiology was consulted. Her metoprolol  was titrated to 100mg  BID. Echo showed severe dilation of bilateral atria, LVEF 45-50% with diffuse severe hypokinesis of the left ventricular wall, severe aortic stenosis. On 7/29, she was transitioned from IV to PO diltiazem .  She was placed on a final regimen of diltiazem  24-hour capsule 180 mg daily, Lopressor  100 mg 2 times daily. Patient's heart rate was controlled in the high 80s at discharge with stable blood pressures.   UTI UTI thought to be trigger for patient's A-fib with RVR. Patient treated with IV ceftriaxone  to completion from (7/27 -7/31).   History of mitral valve replacement - St Jude mechanical valve Abnormal INR Patient's warfarin was found to be 10.5 on 7/27. Warfarin was held and no reversal agents given as there were no signs of bleeding and her hemoglobin remained stable. Her INR improved to 8.4 at time of discharge.  Warfarin clinic follow-up appointment scheduled for 8/1.  Other chronic conditions were medically managed with home medications and formulary alternatives as necessary (CKD 3, CHF)  PCP Follow-up Recommendations: INR recheck 8/1 Follow-up with cardiology  Severe aortic stenosis f/u US  Follow-up BMP

## 2023-08-16 NOTE — Progress Notes (Addendum)
     Daily Progress Note Intern Pager: [phone]  [name] Medical record number: [id number] Date of birth: 1935-01-21 Age: 88 y.o. Gender: female  Primary Care Provider: [name] Camie CROME, MD Consultants: Cardiology Code Status: Full  Pt Overview and Major Events to Date:  08/15/23: Admitted to FMTS  Medical Decision Making:  [name] 88 y.o. admitted for Afib with RVR found to also have UTI. PMH includes Afib, CKD, MVR, CHF.  Assessment & Plan Atrial fibrillation with RVR (HCC) Rate controlled overnight on diltiazem  7.5mg /hr.  - Appreciate ongoing Cards recs, considering TTE - Cont diltiazem  gtt - Cont metoprolol  50 BID - Cont LR 142ml/hr for additional 24 hours  UTI (urinary tract infection) UA and sx consistent with UTI. Urine cx not yet available. S/p 2g CTX on 7/26.  - Cont CTX 1g daily  - Fu urine cx - adjust abx as needed  Hypertension Remained low-normotensive overnight.  - Cont metoprolol  and diltiazem  per above - Cont holding home Imdur , Hydral, Lasix  iso low-normotensive BP - restart as indicated  History of mitral valve replacement - St Jude mechanical valve INR elevated to 10 this morning.  -Per cardiology, hold warfarin and monitor for signs of bleeding -AM CBC   Chronic and stable conditions: CKD3: Cr improved 4.16 > 3.41 CHF: Remains euvolemic on exam. CTM. Appreciate cards recs. AM BMP, Mg.   FEN/GI: Regular  PPx: Holding warfarin given INR Dispo: Home pending clinical improvement   Subjective:  Reports continued burning with peeing and some abdominal pain. Otherwise no chest symptoms. Headache resolved.   Objective: Temp:  [97.6 F (36.4 C)-98.6 F (37 C)] 97.6 F (36.4 C) (07/27 0306) Pulse Rate:  [74-132] 80 (07/27 0306) Resp:  [11-25] 18 (07/27 0306) BP: (101-116)/(66-88) 103/67 (07/27 0306) SpO2:  [94 %-99 %] 95 % (07/27 0306) Physical Exam: General: No acute distress. Resting comfortably in room. CV: Irregular S1/S2  with diastolic murmur. No extra heart sounds. Warm and well-perfused. Pulm: Breathing comfortably on room air. CTAB. No increased WOB. Abd: Soft, non-distended. Tender to palpation.  Ext: No lower extremity edema bilaterally.  Skin:  Warm, dry. Psych: Pleasant and appropriate.   Laboratory: Most recent CBC Lab Results  Component Value Date   WBC 11.9 (H) 08/16/2023   HGB 9.2 (L) 08/16/2023   HCT 30.4 (L) 08/16/2023   MCV 87.1 08/16/2023   PLT 231 08/16/2023   Most recent BMP    Latest Ref Rng & Units 08/16/2023    3:25 AM  BMP  Glucose 70 - 99 mg/dL 894   BUN 8 - 23 mg/dL 72   Creatinine 9.55 - 1.00 mg/dL 6.58   Sodium 864 - 854 mmol/L 135   Potassium 3.5 - 5.1 mmol/L 4.0   Chloride 98 - 111 mmol/L 107   CO2 22 - 32 mmol/L 19   Calcium 8.9 - 10.3 mg/dL 8.2     Lab Results  Component Value Date   INR 10.5 (HH) 08/16/2023   INR 7.6 (HH) 08/15/2023   INR 3.2 (A) 06/26/2023   PROTIME 18.5 06/21/2008    Tami Perkins, MD 08/16/2023, 6:00 AM  PGY-2, [facility] FPTS Intern pager: [phone], text pages welcome Secure chat group [facility] Teaching Service

## 2023-08-16 NOTE — Progress Notes (Signed)
  Echocardiogram 2D Echocardiogram has been performed.  [name] 08/16/2023, 10:02 AM

## 2023-08-16 NOTE — Assessment & Plan Note (Deleted)
 Cr improved 4.16 > 3.41.

## 2023-08-16 NOTE — Assessment & Plan Note (Addendum)
 UA and sx consistent with UTI. Urine cx not yet available. S/p 2g CTX on 7/26.  - Cont CTX 1g daily  - Fu urine cx - adjust abx as needed

## 2023-08-16 NOTE — Significant Event (Signed)
 Called re INR of 10. Mechanical MVR No overt bleeding. Hgb 9.2 Did not get warfarin dose last nigh  Recommend for now, continue to hold warfarin. Would not rush to reverse INR unless signs of bleeding given mechanical MVR. Follow INR  Yu-Ping Shauntea [facility] on call.

## 2023-08-16 NOTE — Assessment & Plan Note (Addendum)
 Remained low-normotensive overnight.  - Cont metoprolol  and diltiazem  per above - Cont holding home Imdur , Hydral, Lasix  iso low-normotensive BP - restart as indicated

## 2023-08-16 NOTE — Progress Notes (Signed)
 PHARMACY - ANTICOAGULATION CONSULT NOTE  Pharmacy Consult for warfarin Indication: MVR and chronic afib  Allergies  Allergen Reactions   Iodinated Contrast Media Rash    Pt stated in the past had broken out in a rash on back and itching.    Esomeprazole Magnesium      REACTION: stomach upset, nausea    Patient Measurements: Weight: 66.7 kg (147 lb 0.8 oz)  Vital Signs: Temp: 97.6 F (36.4 C) (07/27 0306) Temp Source: Axillary (07/27 0306) BP: 103/67 (07/27 0306) Pulse Rate: 80 (07/27 0306)  Labs: Recent Labs    08/15/23 1130 08/16/23 0325  HGB 9.5* 9.2*  HCT 30.9* 30.4*  PLT 245 231  LABPROT 67.3* 86.4*  INR 7.6* 10.5*  CREATININE 4.16* 3.41*    Estimated Creatinine Clearance: 10.3 mL/min (A) (by C-G formula based on SCr of 3.41 mg/dL (H)).   Medical History: Past Medical History:  Diagnosis Date   Asthma    years ago   Chronic atrial fibrillation (HCC)    Dilated cardiomyopathy (HCC)    history of this, now resolved   Dysphagia    Hx   Fracture of tibial plateau 05/21/2015   GERD (gastroesophageal reflux disease)    GI bleed    Hemorrhoid 04/2014   bleeding   History of blood transfusion    Hypertension    Microcytic anemia    Nonsustained ventricular tachycardia (HCC)    Osteoporosis    Protein in urine    RBBB (right bundle branch block)    Rheumatic mitral valve disease      Assessment: 81 YOF presenting with weakness, Hx of afib and mechanical mitral valve replacement on warfarin PTA with INR on admission supratheraputic at 7.6  PTA dosing: 5mg  on Tue/thur/Sat, 7.5mg  all other days  07/27 AM update: INR supratherapeutic at 10; no s/sx of overt bleeding seen per MD note on 07/27. Hgb, Hct and Platelets are stable.  Goal of Therapy:  INR 2.5-3.5 Monitor platelets by anticoagulation protocol: Yes   Plan:  Hold warfarin today Daily INR, s/s bleeding, CBC  R. Samual Satterfield, PharmD PGY-1 Acute Care Pharmacy Resident [facility] 08/16/2023 7:37 AM

## 2023-08-17 ENCOUNTER — Ambulatory Visit

## 2023-08-17 DIAGNOSIS — N3000 Acute cystitis without hematuria: Secondary | ICD-10-CM | POA: Diagnosis not present

## 2023-08-17 DIAGNOSIS — Z789 Other specified health status: Secondary | ICD-10-CM

## 2023-08-17 DIAGNOSIS — I5042 Chronic combined systolic (congestive) and diastolic (congestive) heart failure: Secondary | ICD-10-CM | POA: Diagnosis not present

## 2023-08-17 DIAGNOSIS — I4891 Unspecified atrial fibrillation: Secondary | ICD-10-CM | POA: Diagnosis not present

## 2023-08-17 DIAGNOSIS — R791 Abnormal coagulation profile: Secondary | ICD-10-CM

## 2023-08-17 DIAGNOSIS — I482 Chronic atrial fibrillation, unspecified: Secondary | ICD-10-CM | POA: Diagnosis not present

## 2023-08-17 LAB — URINE CULTURE

## 2023-08-17 LAB — CBC
HCT: 32.6 % — ABNORMAL LOW (ref 36.0–46.0)
Hemoglobin: 9.9 g/dL — ABNORMAL LOW (ref 12.0–15.0)
MCH: 26.2 pg (ref 26.0–34.0)
MCHC: 30.4 g/dL (ref 30.0–36.0)
MCV: 86.2 fL (ref 80.0–100.0)
Platelets: 272 K/uL (ref 150–400)
RBC: 3.78 MIL/uL — ABNORMAL LOW (ref 3.87–5.11)
RDW: 15.1 % (ref 11.5–15.5)
WBC: 13 K/uL — ABNORMAL HIGH (ref 4.0–10.5)
nRBC: 0 % (ref 0.0–0.2)

## 2023-08-17 LAB — FERRITIN: Ferritin: 204 ng/mL (ref 11–307)

## 2023-08-17 LAB — BASIC METABOLIC PANEL WITH GFR
Anion gap: 8 (ref 5–15)
BUN: 62 mg/dL — ABNORMAL HIGH (ref 8–23)
CO2: 20 mmol/L — ABNORMAL LOW (ref 22–32)
Calcium: 8.6 mg/dL — ABNORMAL LOW (ref 8.9–10.3)
Chloride: 108 mmol/L (ref 98–111)
Creatinine, Ser: 3.42 mg/dL — ABNORMAL HIGH (ref 0.44–1.00)
GFR, Estimated: 13 mL/min — ABNORMAL LOW (ref >60)
Glucose, Bld: 119 mg/dL — ABNORMAL HIGH (ref 70–99)
Potassium: 4.6 mmol/L (ref 3.5–5.1)
Sodium: 136 mmol/L (ref 135–145)

## 2023-08-17 LAB — PROTIME-INR
INR: >10 (ref 0.8–1.2)
Prothrombin Time: >90 s — ABNORMAL HIGH (ref 11.4–15.2)

## 2023-08-17 LAB — MAGNESIUM: Magnesium: 2.3 mg/dL (ref 1.7–2.4)

## 2023-08-17 MED ORDER — METOPROLOL TARTRATE 25 MG PO TABS
75.0000 mg | ORAL_TABLET | Freq: Two times a day (BID) | ORAL | Status: DC
  Administered 2023-08-17 – 2023-08-18 (×2): 75 mg via ORAL
  Filled 2023-08-17 (×2): qty 1

## 2023-08-17 MED ORDER — SODIUM CHLORIDE 0.9 % IV SOLN: INTRAVENOUS | Status: AC

## 2023-08-17 MED ORDER — FUROSEMIDE 40 MG PO TABS
40.0000 mg | ORAL_TABLET | Freq: Every day | ORAL | Status: DC
  Administered 2023-08-17 – 2023-08-20 (×4): 40 mg via ORAL
  Filled 2023-08-17 (×4): qty 1

## 2023-08-17 NOTE — Progress Notes (Signed)
 Daily Progress Note Intern Pager: [phone]  [name] Medical record number: [id number] Date of birth: 02-08-1935 Age: 88 y.o. Gender: female  Primary Care Provider: [name] Camie CROME, MD Consultants: Cardiology Code Status: Full  Pt Overview and Major Events to Date:  7/26-admitted  Medical Decision Making:  88 yo female admitted for Afib w/ RVR w/ concomitant UTI. Warfarin held in the setting of INR > 10. Past medical history includes A-fib, CKD, CHF, mitral valve replacement. Assessment & Plan Atrial fibrillation with RVR (HCC) Pt remains in Afib w/ HR in 110s. Cards recommending metoprolol  75mg  BID and dilt ggt at 10mg /hr given persistent tachycardia. Echo showing severe dilation of b/l atria, LVEF 45-50% w/ diffuse, severe hypokinesis of the L ventricular wall. Mg> 2 K > 4.  - Appreciate ongoing Cards recs, considering TTE - Increase Diltiazem  ggt to 10mg /hr.   - Increase metoprolol  to 75 BID - Resume LR @ 66ml/hr for 24 hours  UTI (urinary tract infection) UA and sx consistent with UTI. Urine cx in process.  - Cont CTX 1g daily  - Fu urine cx - adjust abx as needed  - Bladder scan x 1 given hx of neurogenic bladder w/ urinary retention  Hypertension BP stable overnight.  - Cont metoprolol  and diltiazem  per above - Cont holding home Imdur , Hydral, Lasix  iso low-normotensive BP - restart as indicated  History of mitral valve replacement - St Jude mechanical valve INR remains elevated >10 this morning.  -Per cardiology, hold warfarin and monitor for signs of bleeding -AM CBC  Chronic health problem CKD3 - Cr plateaued at 3.41, LR @ 73ml/hr for 24 hours CHF - Lasix  40mg  daily   FEN/GI: Regular PPx: Holding warfarin Dispo:Home pending clinical improvement .    Subjective:  NAEON, patient reports feeling well without shortness of breath or chest pain.   Objective: Temp:  [97.7 F (36.5 C)-98.7 F (37.1 C)] 97.8 F (36.6 C) (07/28 0600) Pulse  Rate:  [81-114] 105 (07/28 0600) Resp:  [18-32] 29 (07/28 0600) BP: (88-145)/(62-87) 126/87 (07/28 0600) SpO2:  [93 %-98 %] 93 % (07/28 0600) Weight:  [67.7 kg] 67.7 kg (07/28 0600) Physical Exam: General: Awake, alert, NAD.  Speaks clearly, hard of hearing.  Cardio: Tachycardic, irregular rhythm.  Variable systolic murmur appreciated. 2+ radial and dorsalis pedis pulses b/l w/ good capillary refill.  Resp: CTA bilaterally. No wheezes, rales, or rhonchi. Tachypneic on room air but satting well.  Abdomen: soft, non-distended. Mild lower abdominal TTP. No guarding, rigidity, or rebound.   Laboratory: Most recent CBC Lab Results  Component Value Date   WBC 13.0 (H) 08/17/2023   HGB 9.9 (L) 08/17/2023   HCT 32.6 (L) 08/17/2023   MCV 86.2 08/17/2023   PLT 272 08/17/2023   Most recent BMP    Latest Ref Rng & Units 08/17/2023    2:32 AM  BMP  Glucose 70 - 99 mg/dL 880   BUN 8 - 23 mg/dL 62   Creatinine 9.55 - 1.00 mg/dL 6.57   Sodium 864 - 854 mmol/L 136   Potassium 3.5 - 5.1 mmol/L 4.6   Chloride 98 - 111 mmol/L 108   CO2 22 - 32 mmol/L 20   Calcium 8.9 - 10.3 mg/dL 8.6   Magnesium  2.3 ferritin 204  Imaging/Diagnostic Tests: CXR: Stable marked cardiomegaly. No active lung disease. My impression: Cardiomegaly stable w/ prior in 2024. No pneumothorax or consolidation noted. CP angles clear. No acute bony abnormality.   Echo:  1. Left ventricular ejection fraction, by estimation, is 45 to 50%. The  left ventricle has mildly decreased function. The left ventricle  demonstrates regional wall motion abnormalities . There is moderate  left ventricular hypertrophy. Left ventricular diastolic function could  not be evaluated. There is severe hypokinesis of the left ventricular,  basal-mid lateral wall, inferolateral wall and inferior wall.   2. Right ventricular systolic function is mildly reduced. The right  ventricular size is normal. There is severely elevated pulmonary artery   systolic pressure. The estimated right ventricular systolic pressure is  60.4 mmHg.   3. Left atrial size was severely dilated.   4. Right atrial size was severely dilated.   5. The mitral valve has been repaired/replaced. Trivial mitral valve  regurgitation. The mean mitral valve gradient is 5.0 mmHg. There is a St.  Jude present in the mitral position. Procedure Date: 34. Echo findings  are consistent with normal structure  and function of the mitral valve prosthesis.   6. The aortic valve is calcified. There is moderate calcification of the  aortic valve. Aortic valve regurgitation is trivial. Moderate to severe  aortic valve stenosis. Aortic valve area, by VTI measures 0.68 cm. Aortic  valve mean gradient measures 22.5   mmHg. Aortic valve Vmax measures 3.13 m/s. Peak gradient 39.1 mmHg, DI  0.30.   7. The inferior vena cava is normal in size with <50% respiratory  variability, suggesting right atrial pressure of 8 mmHg.   Manon Jester, DO 08/17/2023, 7:20 AM  PGY-1, [facility] FPTS Intern pager: [phone], text pages welcome Secure chat group [facility] Teaching Service

## 2023-08-17 NOTE — Plan of Care (Signed)

## 2023-08-17 NOTE — Plan of Care (Signed)
 ?  Problem: Coping: ?Goal: Level of anxiety will decrease ?Outcome: Progressing ?  ?Problem: Safety: ?Goal: Ability to remain free from injury will improve ?Outcome: Progressing ?  ?

## 2023-08-17 NOTE — Assessment & Plan Note (Addendum)
 BP stable overnight.  - Cont metoprolol  and diltiazem  per above - Cont holding home Imdur , Hydral, Lasix  iso low-normotensive BP - restart as indicated

## 2023-08-17 NOTE — Assessment & Plan Note (Addendum)
 CKD3 - Cr plateaued at 3.41, LR @ 16ml/hr for 24 hours CHF - Lasix  40mg  daily

## 2023-08-17 NOTE — Progress Notes (Signed)
 Pts. INR greater than 10. On call for Family medicine page to make aware.

## 2023-08-17 NOTE — Progress Notes (Signed)
 Rounding Note   [name] Date of Encounter: 08/17/2023  [facility] Cardiologist: Danelle Birmingham, MD   Subjective  Ambulated earlier, but was weak.   Scheduled Meds:  dorzolamide -timolol   1 drop Both Eyes BID   furosemide   40 mg Oral Daily   latanoprost   1 drop Both Eyes QHS   metoprolol  tartrate  50 mg Oral BID   pantoprazole   40 mg Oral Daily   tamsulosin   0.4 mg Oral Daily   Warfarin - Pharmacist Dosing Inpatient   Does not apply q1600   Continuous Infusions:  sodium chloride  90 mL/hr at 08/17/23 0845   cefTRIAXone  (ROCEPHIN )  IV 1 g (08/17/23 1116)   diltiazem  (CARDIZEM ) infusion 7.5 mg/hr (08/17/23 1113)   PRN Meds: acetaminophen    Vital Signs  Vitals:   08/17/23 0945 08/17/23 1000 08/17/23 1030 08/17/23 1100  BP: 101/73 106/81 (!) 138/90 (!) 141/72  Pulse:      Resp: (!) 27  (!) 35   Temp:      TempSrc:      SpO2:      Weight:        Intake/Output Summary (Last 24 hours) at 08/17/2023 1126 Last data filed at 08/17/2023 1026 Gross per 24 hour  Intake 839.88 ml  Output 500 ml  Net 339.88 ml      08/17/2023    6:00 AM 08/16/2023    5:00 AM 06/26/2023    8:59 AM  Last 3 Weights  Weight (lbs) 149 lb 4 oz 147 lb 0.8 oz 149 lb  Weight (kg) 67.7 kg 66.7 kg 67.586 kg      Telemetry Atrial fibrillation with bundle branch block - Personally Reviewed  ECG  Atrial fibrillation with RBBB - Personally Reviewed  Physical Exam  GEN: No acute distress.   Neck: No JVD Cardiac: irregular, no murmurs, rubs, or gallops.  Respiratory: Clear to auscultation bilaterally. GI: Soft, nontender, non-distended  MS: No edema; No deformity. Neuro:  Nonfocal  Psych: Normal affect   Labs High Sensitivity Troponin:  No results for input(s): TROPONINIHS in the last 720 hours.   Chemistry Recent Labs  Lab 08/15/23 1130 08/16/23 0325 08/17/23 0232  NA 132* 135 136  K 3.4* 4.0 4.6  CL 101 107 108  CO2 19* 19* 20*  GLUCOSE 110* 105* 119*   BUN 78* 72* 62*  CREATININE 4.16* 3.41* 3.42*  CALCIUM 8.3* 8.2* 8.6*  MG 2.0 2.2 2.3  PROT 7.3  --   --   ALBUMIN 2.4*  --   --   AST 21  --   --   ALT 11  --   --   ALKPHOS 46  --   --   BILITOT 0.9  --   --   GFRNONAA 10* 13* 13*  ANIONGAP 12 9 8     Lipids No results for input(s): CHOL, TRIG, HDL, LABVLDL, LDLCALC, CHOLHDL in the last 168 hours.  Hematology Recent Labs  Lab 08/16/23 0325 08/16/23 1505 08/17/23 0232  WBC 11.9* 13.4* 13.0*  RBC 3.49* 3.72* 3.78*  HGB 9.2* 9.7* 9.9*  HCT 30.4* 31.6* 32.6*  MCV 87.1 84.9 86.2  MCH 26.4 26.1 26.2  MCHC 30.3 30.7 30.4  RDW 14.7 14.8 15.1  PLT 231 255 272   Thyroid  No results for input(s): TSH, FREET4 in the last 168 hours.  BNPNo results for input(s): BNP, PROBNP in the last 168 hours.  DDimer No results for input(s): DDIMER in the last 168 hours.  Radiology  DG CHEST PORT 1 VIEW Result Date: 08/16/2023 CLINICAL DATA:  Dyspnea. EXAM: PORTABLE CHEST 1 VIEW COMPARISON:  08/15/2023 FINDINGS: Stable marked cardiomegaly. Prior median sternotomy. Both lungs are clear. IMPRESSION: Stable marked cardiomegaly. No active lung disease. Electronically Signed   By: Norleen DELENA Kil M.D.   On: 08/16/2023 18:20   ECHOCARDIOGRAM COMPLETE Result Date: 08/16/2023    ECHOCARDIOGRAM REPORT   Patient Name:   [name] Date of Exam: 08/16/2023 Medical Rec #:  [id number]      Height:       60.0 in Accession #:    [id number]     Weight:       147.0 lb Date of Birth:  05-31-1935      BSA:          1.638 m Patient Age:    87 years       BP:           130/68 mmHg Patient Gender: F              HR:           100 bpm. Exam Location:  Inpatient Procedure: 2D Echo (Both Spectral and Color Flow Doppler were utilized during            procedure). Indications:     atrial fibrillation  History:         Patient has prior history of Echocardiogram examinations, most                  recent 04/05/2022. Cardiomyopathy, Rheumatic heart disease.                   chronic kidney disease, Arrythmias:Atrial Fibrillation and                  RBBB; Risk Factors:Hypertension.                   Mitral Valve: St. Jude valve is present in the mitral position.                  Procedure Date: 41.  Sonographer:     Tinnie Barefoot RDCS Referring Phys:  [id number] VINIE BROCKS HILTY Diagnosing Phys: VINIE Maxcy MD IMPRESSIONS  1. Left ventricular ejection fraction, by estimation, is 45 to 50%. The left ventricle has mildly decreased function. The left ventricle demonstrates regional wall motion abnormalities (see scoring diagram/findings for description). There is moderate left ventricular hypertrophy. Left ventricular diastolic function could not be evaluated. There is severe hypokinesis of the left ventricular, basal-mid lateral wall, inferolateral wall and inferior wall.  2. Right ventricular systolic function is mildly reduced. The right ventricular size is normal. There is severely elevated pulmonary artery systolic pressure. The estimated right ventricular systolic pressure is 60.4 mmHg.  3. Left atrial size was severely dilated.  4. Right atrial size was severely dilated.  5. The mitral valve has been repaired/replaced. Trivial mitral valve regurgitation. The mean mitral valve gradient is 5.0 mmHg. There is a St. Jude present in the mitral position. Procedure Date: 60. Echo findings are consistent with normal structure and function of the mitral valve prosthesis.  6. The aortic valve is calcified. There is moderate calcification of the aortic valve. Aortic valve regurgitation is trivial. Moderate to severe aortic valve stenosis. Aortic valve area, by VTI measures 0.68 cm. Aortic valve mean gradient measures 22.5  mmHg. Aortic valve Vmax measures 3.13 m/s. Peak gradient 39.1 mmHg, DI 0.30.  7. The  inferior vena cava is normal in size with <50% respiratory variability, suggesting right atrial pressure of 8 mmHg. Comparison(s): Changes from prior study are noted.  04/05/2022: LVEF 55%, mild to moderate AS - DI 30, MVR MG 2 mmHg. Conclusion(s)/Recommendation(s): Regional wall motion abnormality noted on prior echo - noted fixed inferior/inferolateral defect on prior stress testing, however, coronary anatomy was normal by prior cath. FINDINGS  Left Ventricle: Left ventricular ejection fraction, by estimation, is 45 to 50%. The left ventricle has mildly decreased function. The left ventricle demonstrates regional wall motion abnormalities. Severe hypokinesis of the left ventricular, basal-mid lateral wall, inferolateral wall and inferior wall. The left ventricular internal cavity size was normal in size. There is moderate left ventricular hypertrophy. Left ventricular diastolic function could not be evaluated due to mitral valve replacement. Left ventricular diastolic function could not be evaluated.  LV Wall Scoring: The entire lateral wall is hypokinetic. Right Ventricle: The right ventricular size is normal. No increase in right ventricular wall thickness. Right ventricular systolic function is mildly reduced. There is severely elevated pulmonary artery systolic pressure. The tricuspid regurgitant velocity is 3.62 m/s, and with an assumed right atrial pressure of 8 mmHg, the estimated right ventricular systolic pressure is 60.4 mmHg. Left Atrium: Left atrial size was severely dilated. Right Atrium: Right atrial size was severely dilated. Pericardium: There is no evidence of pericardial effusion. Mitral Valve: The mitral valve has been repaired/replaced. Trivial mitral valve regurgitation. There is a St. Jude present in the mitral position. Procedure Date: 52. Echo findings are consistent with normal structure and function of the mitral valve prosthesis. MV peak gradient, 10.5 mmHg. The mean mitral valve gradient is 5.0 mmHg with average heart rate of 111 bpm. Tricuspid Valve: The tricuspid valve is grossly normal. Tricuspid valve regurgitation is mild. Aortic Valve: The  aortic valve is calcified. There is moderate calcification of the aortic valve. Aortic valve regurgitation is trivial. Moderate to severe aortic stenosis is present. Aortic valve mean gradient measures 22.5 mmHg. Aortic valve peak gradient measures 39.1 mmHg. Aortic valve area, by VTI measures 0.68 cm. Pulmonic Valve: The pulmonic valve was normal in structure. Pulmonic valve regurgitation is not visualized. Aorta: The aortic root and ascending aorta are structurally normal, with no evidence of dilitation. Venous: The inferior vena cava is normal in size with less than 50% respiratory variability, suggesting right atrial pressure of 8 mmHg. IAS/Shunts: No atrial level shunt detected by color flow Doppler.  LEFT VENTRICLE PLAX 2D LVIDd:         4.30 cm LVIDs:         3.10 cm LV PW:         1.10 cm LV IVS:        1.10 cm LVOT diam:     1.70 cm LV SV:         34 LV SV Index:   21 LVOT Area:     2.27 cm  LV Volumes (MOD) LV vol d, MOD A2C: 75.5 ml LV vol d, MOD A4C: 88.5 ml LV vol s, MOD A2C: 33.6 ml LV vol s, MOD A4C: 60.3 ml LV SV MOD A2C:     42.0 ml LV SV MOD A4C:     88.5 ml LV SV MOD BP:      38.4 ml RIGHT VENTRICLE          IVC RV Basal diam:  4.00 cm  IVC diam: 1.70 cm TAPSE (M-mode): 1.3 cm LEFT ATRIUM  Index         RIGHT ATRIUM           Index LA diam:      5.00 cm  3.05 cm/m    RA Area:     39.80 cm LA Vol (A4C): 182.5 ml 111.42 ml/m  RA Volume:   172.00 ml 105.01 ml/m  AORTIC VALVE AV Area (Vmax):    0.71 cm AV Area (Vmean):   0.67 cm AV Area (VTI):     0.68 cm AV Vmax:           312.75 cm/s AV Vmean:          219.250 cm/s AV VTI:            0.497 m AV Peak Grad:      39.1 mmHg AV Mean Grad:      22.5 mmHg LVOT Vmax:         97.93 cm/s LVOT Vmean:        65.125 cm/s LVOT VTI:          0.149 m LVOT/AV VTI ratio: 0.30  AORTA Ao Asc diam: 3.10 cm MITRAL VALVE              TRICUSPID VALVE MV Area VTI:  1.52 cm    TR Peak grad:   52.4 mmHg MV Peak grad: 10.5 mmHg   TR Vmax:        362.00 cm/s  MV Mean grad: 5.0 mmHg MV Vmax:      1.62 m/s    SHUNTS MV Vmean:     106.8 cm/s  Systemic VTI:  0.15 m                           Systemic Diam: 1.70 cm Vinie Maxcy MD Electronically signed by Vinie Maxcy MD Signature Date/Time: 08/16/2023/10:50:57 AM    Final (Updated)    DG Chest Port 1 View Result Date: 08/15/2023 CLINICAL DATA:  Shortness of breath.  Weakness. EXAM: PORTABLE CHEST 1 VIEW COMPARISON:  05/10/2022 FINDINGS: Stable cardiomegaly. Previous median sternotomy. Both lungs are clear. IMPRESSION: Stable cardiomegaly. No active lung disease. Electronically Signed   By: Norleen DELENA Kil M.D.   On: 08/15/2023 12:16    Cardiac Studies Echo 08/16/2023  1. Left ventricular ejection fraction, by estimation, is 45 to 50%. The  left ventricle has mildly decreased function. The left ventricle  demonstrates regional wall motion abnormalities (see scoring  diagram/findings for description). There is moderate  left ventricular hypertrophy. Left ventricular diastolic function could  not be evaluated. There is severe hypokinesis of the left ventricular,  basal-mid lateral wall, inferolateral wall and inferior wall.   2. Right ventricular systolic function is mildly reduced. The right  ventricular size is normal. There is severely elevated pulmonary artery  systolic pressure. The estimated right ventricular systolic pressure is  60.4 mmHg.   3. Left atrial size was severely dilated.   4. Right atrial size was severely dilated.   5. The mitral valve has been repaired/replaced. Trivial mitral valve  regurgitation. The mean mitral valve gradient is 5.0 mmHg. There is a St.  Jude present in the mitral position. Procedure Date: 96. Echo findings  are consistent with normal structure  and function of the mitral valve prosthesis.   6. The aortic valve is calcified. There is moderate calcification of the  aortic valve. Aortic valve regurgitation is trivial. Moderate to severe  aortic valve stenosis.  Aortic valve  area, by VTI measures 0.68 cm. Aortic  valve mean gradient measures 22.5   mmHg. Aortic valve Vmax measures 3.13 m/s. Peak gradient 39.1 mmHg, DI  0.30.   7. The inferior vena cava is normal in size with <50% respiratory  variability, suggesting right atrial pressure of 8 mmHg.   Comparison(s): Changes from prior study are noted. 04/05/2022: LVEF 55%,  mild to moderate AS - DI 30, MVR MG 2 mmHg.   Conclusion(s)/Recommendation(s): Regional wall motion abnormality noted on  prior echo - noted fixed inferior/inferolateral defect on prior stress  testing, however, coronary anatomy was normal by prior cath.   Patient Profile   88 y.o. female with PMH of MS s/p MVR 1987, permanent atrial fibrillation, NICM with improved EF (normal cors on cath 04/02/2021), chronic diastolic CHF, CKD stage III, HTN, chronic anemia and NSVT who presented with UTI and dehydration and was in afib with RVR. Cr peaked at 4.16. INR was 10.   Assessment & Plan   Permanent atrial fibrillation with RVR  - on metoprolol  succinate 100mg  daily at home. Currently on lopressor  50mg  BID and IV diltiazem  running at 7.5 mg/hr, will increase IV diltiazem  to 10 mg/hr. Increase lopressor  to 75mg  BID.   - fast heart rate likely driven by underlying illness.  Moderate to severe aortic stenosis  - was previously mild to moderate AS on echo in March 2024. Now moderate to severe AS.  - monitor for sign of volume overload. Consider outpatient evaluation by structural heart clinic  H/o mitral valve replacement: stable on echo.  NICM: CXR yesterday showed clear lung, no crackles on exam despite she appeared short of breath. She says she just finished ambulating and was weak. EF was down to 35% in 2023, subsequent cath.   Hypertension    For questions or updates, please contact [facility] Please consult [email or web address] for contact info under     Signed, Scot Ford, PA  08/17/2023, 11:26 AM

## 2023-08-17 NOTE — Assessment & Plan Note (Addendum)
 UA and sx consistent with UTI. Urine cx in process.  - Cont CTX 1g daily  - Fu urine cx - adjust abx as needed  - Bladder scan x 1 given hx of neurogenic bladder w/ urinary retention

## 2023-08-17 NOTE — Plan of Care (Signed)
 FMTS Interim Progress Note  S: Patient seen resting comfortably in bed when seen during evening rounds.  She states she is having pain anytime she urinates and describes the pain as a burning sensation.  The patient denies any chest pain or shortness of breath at this time.  Patient had not eaten any of her dinner.  States she is often sleeping and does not eat.  Mentioned palliative care to patient with the intent of coordinating care and discussing goals of care.  Patient is willing to speak with them.  O: BP 134/67   Pulse (!) 109   Temp 98.2 F (36.8 C) (Oral)   Resp (!) 34   Wt 67.7 kg   SpO2 97%   BMI 29.15 kg/m   General: Awake, alert, NAD, elderly appearing female Cardiovascular: Irregularly irregular, consistent with mechanical valve Respiratory: CTAB, tachypneic Abdominal: Bowel sounds present, abdomen nontender to palpation Extremities chronic venous stasis changes bilaterally, bilateral nonpitting edema  A/P: A-fib with RVR Patient tachycardic to the mid 110s. - Diltiazem  ggt 10 mg/hr - metoprolol  75 mg BID - NS 90 mL/hr - Continue management per day team  UTI - Continue management per day team  Hypertension Normotensive.  History of mitral valve replacement - St Jude mechanical valve - Continue management per day team  Lennie Raguel MATSU, DO 08/17/2023, 9:18 PM PGY-1, [facility] Service pager [phone]

## 2023-08-17 NOTE — Progress Notes (Signed)
 Mobility Specialist: Progress Note   08/17/23 1624  Mobility  Activity Stood at bedside  Level of Assistance Contact guard assist, steadying assist  Assistive Device Four wheel walker  Range of Motion/Exercises Right leg;Left leg;Active  Activity Response Tolerated fair  Mobility Referral Yes  Mobility visit 1 Mobility  Mobility Specialist Start Time (ACUTE ONLY) 1105  Mobility Specialist Stop Time (ACUTE ONLY) 1128  Mobility Specialist Time Calculation (min) (ACUTE ONLY) 23 min    Pt received in bed, agreeable to mobility session with encouragement. MinA for supine>sit bed mobility to assist with trunk elevation. MinG for STS. Completed 10 standing marches. MS wanted to retrieve a RW to try for short bout of ambulation, but pt was very fatigued from marches. Further mobility deferred. SV for sit>supine. TotA+2to slide up in bed. Left in bed with all needs met, call bell in reach. NT and RN present.   Ileana Lute Mobility Specialist Please contact via SecureChat or Rehab office at [phone]

## 2023-08-17 NOTE — Progress Notes (Signed)
 PHARMACY - ANTICOAGULATION CONSULT NOTE  Pharmacy Consult for warfarin Indication: MVR and chronic afib  Allergies  Allergen Reactions   Iodinated Contrast Media Rash    Pt stated in the past had broken out in a rash on back and itching.    Esomeprazole Magnesium      REACTION: stomach upset, nausea    Patient Measurements: Weight: 67.7 kg (149 lb 4 oz)  Vital Signs: Temp: 97.8 F (36.6 C) (07/28 0600) Temp Source: Oral (07/28 0600) BP: 126/87 (07/28 0600) Pulse Rate: 105 (07/28 0600)  Labs: Recent Labs    08/15/23 1130 08/16/23 0325 08/16/23 1505 08/17/23 0232  HGB 9.5* 9.2* 9.7* 9.9*  HCT 30.9* 30.4* 31.6* 32.6*  PLT 245 231 255 272  LABPROT 67.3* 86.4*  --  >90.0*  INR 7.6* 10.5*  --  >10.0*  CREATININE 4.16* 3.41*  --  3.42*    Estimated Creatinine Clearance: 10.3 mL/min (A) (by C-G formula based on SCr of 3.42 mg/dL (H)).   Medical History: Past Medical History:  Diagnosis Date   Asthma    years ago   Chronic atrial fibrillation (HCC)    Dilated cardiomyopathy (HCC)    history of this, now resolved   Dysphagia    Hx   Fracture of tibial plateau 05/21/2015   GERD (gastroesophageal reflux disease)    GI bleed    Hemorrhoid 04/2014   bleeding   History of blood transfusion    Hypertension    Microcytic anemia    Nonsustained ventricular tachycardia (HCC)    Osteoporosis    Protein in urine    RBBB (right bundle branch block)    Rheumatic mitral valve disease      Assessment: [name] presenting with weakness, Hx of afib and mechanical mitral valve replacement on warfarin PTA with INR on admission supratheraputic at 7.6  PTA dosing: 5mg  on Tue/thur/Sat, 7.5mg  all other days  07/28 AM update: INR remains supratherapeutic at >10; no s/sx of overt bleeding seen. Hgb, Hct and Platelets are stable. Not planning to reverse  unless overt bleeding per cardiology given mMVR.   Goal of Therapy:  INR 2.5-3.5 Monitor platelets by anticoagulation protocol:  Yes   Plan:  Hold warfarin today Daily INR, s/s bleeding, CBC  Massie Fila, PharmD Clinical Pharmacist  08/17/2023 6:51 AM

## 2023-08-17 NOTE — Assessment & Plan Note (Addendum)
 Pt remains in Afib w/ HR in 110s. Cards recommending metoprolol  75mg  BID and dilt ggt at 10mg /hr given persistent tachycardia. Echo showing severe dilation of b/l atria, LVEF 45-50% w/ diffuse, severe hypokinesis of the L ventricular wall. Mg> 2 K > 4.  - Appreciate ongoing Cards recs, considering TTE - Increase Diltiazem  ggt to 10mg /hr.   - Increase metoprolol  to 75 BID - Resume LR @ 92ml/hr for 24 hours

## 2023-08-17 NOTE — Assessment & Plan Note (Addendum)
 INR remains elevated >10 this morning.  -Per cardiology, hold warfarin and monitor for signs of bleeding -AM CBC

## 2023-08-18 DIAGNOSIS — I482 Chronic atrial fibrillation, unspecified: Secondary | ICD-10-CM | POA: Diagnosis not present

## 2023-08-18 DIAGNOSIS — I4891 Unspecified atrial fibrillation: Secondary | ICD-10-CM | POA: Diagnosis not present

## 2023-08-18 DIAGNOSIS — R791 Abnormal coagulation profile: Secondary | ICD-10-CM | POA: Diagnosis not present

## 2023-08-18 DIAGNOSIS — N183 Chronic kidney disease, stage 3 unspecified: Secondary | ICD-10-CM | POA: Diagnosis not present

## 2023-08-18 DIAGNOSIS — I5042 Chronic combined systolic (congestive) and diastolic (congestive) heart failure: Secondary | ICD-10-CM | POA: Diagnosis not present

## 2023-08-18 LAB — CBC
HCT: 32.5 % — ABNORMAL LOW (ref 36.0–46.0)
Hemoglobin: 9.8 g/dL — ABNORMAL LOW (ref 12.0–15.0)
MCH: 26.3 pg (ref 26.0–34.0)
MCHC: 30.2 g/dL (ref 30.0–36.0)
MCV: 87.4 fL (ref 80.0–100.0)
Platelets: 266 K/uL (ref 150–400)
RBC: 3.72 MIL/uL — ABNORMAL LOW (ref 3.87–5.11)
RDW: 15.4 % (ref 11.5–15.5)
WBC: 12.1 K/uL — ABNORMAL HIGH (ref 4.0–10.5)
nRBC: 0 % (ref 0.0–0.2)

## 2023-08-18 LAB — MAGNESIUM: Magnesium: 2.2 mg/dL (ref 1.7–2.4)

## 2023-08-18 LAB — BASIC METABOLIC PANEL WITH GFR
Anion gap: 8 (ref 5–15)
BUN: 58 mg/dL — ABNORMAL HIGH (ref 8–23)
CO2: 20 mmol/L — ABNORMAL LOW (ref 22–32)
Calcium: 8.6 mg/dL — ABNORMAL LOW (ref 8.9–10.3)
Chloride: 107 mmol/L (ref 98–111)
Creatinine, Ser: 3.07 mg/dL — ABNORMAL HIGH (ref 0.44–1.00)
GFR, Estimated: 14 mL/min — ABNORMAL LOW (ref >60)
Glucose, Bld: 147 mg/dL — ABNORMAL HIGH (ref 70–99)
Potassium: 4.2 mmol/L (ref 3.5–5.1)
Sodium: 135 mmol/L (ref 135–145)

## 2023-08-18 LAB — PROTIME-INR
INR: 9.6 (ref 0.8–1.2)
Prothrombin Time: 81 s — ABNORMAL HIGH (ref 11.4–15.2)

## 2023-08-18 MED ORDER — METOPROLOL TARTRATE 25 MG PO TABS
25.0000 mg | ORAL_TABLET | Freq: Once | ORAL | Status: AC
  Administered 2023-08-18: 25 mg via ORAL
  Filled 2023-08-18: qty 1

## 2023-08-18 MED ORDER — DILTIAZEM HCL 30 MG PO TABS
30.0000 mg | ORAL_TABLET | Freq: Three times a day (TID) | ORAL | Status: DC
  Administered 2023-08-18 – 2023-08-19 (×4): 30 mg via ORAL
  Filled 2023-08-18 (×4): qty 1

## 2023-08-18 MED ORDER — LACTATED RINGERS IV SOLN: INTRAVENOUS | Status: DC

## 2023-08-18 MED ORDER — METOPROLOL TARTRATE 100 MG PO TABS
100.0000 mg | ORAL_TABLET | Freq: Two times a day (BID) | ORAL | Status: DC
  Administered 2023-08-18 – 2023-08-20 (×4): 100 mg via ORAL
  Filled 2023-08-18 (×4): qty 1

## 2023-08-18 MED ORDER — LACTATED RINGERS IV BOLUS
70.0000 mL | Freq: Once | INTRAVENOUS | Status: AC
  Administered 2023-08-18: 70 mL via INTRAVENOUS

## 2023-08-18 NOTE — Assessment & Plan Note (Addendum)
 INR remains elevated 9.6 this morning.  -Per cardiology, hold warfarin and monitor for signs of bleeding -AM CBC  -Not administering reversal agent given mechanical valve  -AM INR

## 2023-08-18 NOTE — Assessment & Plan Note (Signed)
 BP stable overnight.  - Cont metoprolol  and diltiazem  per above - Cont holding home Imdur , Hydral, Lasix  iso low-normotensive BP - restart as indicated

## 2023-08-18 NOTE — Evaluation (Signed)
 Physical Therapy Evaluation Patient Details Name: [name] MRN: [id number] DOB: [birth year] Today's Date: 08/18/2023  History of Present Illness  88 y.o. female adm 08/15/23 with weakness in Afib with RVR and UTI. PMHx: CM, HFrEF, AVR, Afib, Rt THA, CKD, HTN  Clinical Impression  Pt pleasant and reports fear over weakness and not being able to get up. Pt with min assist for all mobility, decreased strength, transfers, power and function who will benefit from acute therapy to maximize mobility, safety and independence to decrease burden of care. Pt lives alone but reports she can stay with daughter and if that is true then HHPT recommended with supervision/assist for all transfers. If pt cannot stay with family she will benefit from continued inpatient follow up therapy, <3 hours/day.  Pt with inconsistent pleth on O2 probe throughout session with sPO2 88-94% on RA HR 109-133 BP 132/75 (92) after limited gait         If plan is discharge home, recommend the following: A little help with walking and/or transfers;A little help with bathing/dressing/bathroom;Assistance with cooking/housework;Assist for transportation   Can travel by private vehicle        Equipment Recommendations BSC/3in1  Recommendations for Other Services  OT consult    Functional Status Assessment Patient has had a recent decline in their functional status and demonstrates the ability to make significant improvements in function in a reasonable and predictable amount of time.     Precautions / Restrictions Precautions Precautions: Fall;Other (comment) Recall of Precautions/Restrictions: Impaired Precaution/Restrictions Comments: watch sats      Mobility  Bed Mobility Overal bed mobility: Needs Assistance Bed Mobility: Supine to Sit, Sit to Supine     Supine to sit: Min assist Sit to supine: Min assist   General bed mobility comments: min assist to clear legs off EOB and pivot to side, increased time  and cues to scoot to EOB. Pt min assist to return to bed for bladder scan. 2nd exit from bed with min assist to roll and rise from side    Transfers Overall transfer level: Needs assistance   Transfers: Sit to/from Stand Sit to Stand: Min assist           General transfer comment: min assist to power up to standing from bed and chair with cues for hand placement    Ambulation/Gait Ambulation/Gait assistance: Contact guard assist Gait Distance (Feet): 75 Feet Assistive device: Rolling walker (2 wheels) Gait Pattern/deviations: Step-through pattern, Decreased stride length, Trunk flexed   Gait velocity interpretation: <1.8 ft/sec, indicate of risk for recurrent falls   General Gait Details: cues for posture, proximity to RW and activity tolerance. Pt noticeably fatigued but wanting to walk further despite cues for activity modification and pt ultimately needed chair pulled to her at 38' then able to walk additional 20' after seated rest with cues for breathing technique  Stairs            Wheelchair Mobility     Tilt Bed    Modified Rankin (Stroke Patients Only)       Balance Overall balance assessment: Needs assistance Sitting-balance support: No upper extremity supported, Feet supported Sitting balance-Leahy Scale: Good     Standing balance support: Bilateral upper extremity supported, Reliant on assistive device for balance, During functional activity Standing balance-Leahy Scale: Poor Standing balance comment: Rw in standing  Pertinent Vitals/Pain Pain Assessment Pain Assessment: No/denies pain    Home Living Family/patient expects to be discharged to:: Private residence Living Arrangements: Alone Available Help at Discharge: Family;Available PRN/intermittently Type of Home: Apartment Home Access: Level entry       Home Layout: One level Home Equipment: Rollator (4 wheels);Shower seat;Wheelchair - manual;Grab  bars - tub/shower      Prior Function Prior Level of Function : Independent/Modified Independent             Mobility Comments: uses a rollator at baseline ADLs Comments: daughter assists with IADLs     Extremity/Trunk Assessment   Upper Extremity Assessment Upper Extremity Assessment: Generalized weakness    Lower Extremity Assessment Lower Extremity Assessment: Generalized weakness    Cervical / Trunk Assessment Cervical / Trunk Assessment: Normal  Communication   Communication Communication: Impaired Factors Affecting Communication: Hearing impaired    Cognition Arousal: Alert Behavior During Therapy: WFL for tasks assessed/performed   PT - Cognitive impairments: Safety/Judgement                       PT - Cognition Comments: decreased awareness of deficits, self-limiting at times Following commands: Intact       Cueing Cueing Techniques: Verbal cues     General Comments      Exercises     Assessment/Plan    PT Assessment Patient needs continued PT services  PT Problem List Decreased strength;Decreased activity tolerance;Decreased balance;Decreased mobility;Decreased knowledge of use of DME;Decreased safety awareness       PT Treatment Interventions DME instruction;Gait training;Functional mobility training;Therapeutic activities;Therapeutic exercise;Patient/family education;Balance training    PT Goals (Current goals can be found in the Care Plan section)  Acute Rehab PT Goals Patient Stated Goal: return home with daughter PT Goal Formulation: With patient Time For Goal Achievement: 09/01/23 Potential to Achieve Goals: Good    Frequency Min 2X/week     Co-evaluation               AM-PAC PT 6 Clicks Mobility  Outcome Measure Help needed turning from your back to your side while in a flat bed without using bedrails?: A Little Help needed moving from lying on your back to sitting on the side of a flat bed without using  bedrails?: A Little Help needed moving to and from a bed to a chair (including a wheelchair)?: A Little Help needed standing up from a chair using your arms (e.g., wheelchair or bedside chair)?: A Little Help needed to walk in hospital room?: A Little Help needed climbing 3-5 steps with a railing? : A Lot 6 Click Score: 17    End of Session Equipment Utilized During Treatment: Gait belt Activity Tolerance: Patient tolerated treatment well Patient left: in chair;with call bell/phone within reach;with chair alarm set Nurse Communication: Mobility status PT Visit Diagnosis: Other abnormalities of gait and mobility (R26.89);Difficulty in walking, not elsewhere classified (R26.2);Muscle weakness (generalized) (M62.81)    Time: 9244-9167 PT Time Calculation (min) (ACUTE ONLY): 37 min   Charges:   PT Evaluation $PT Eval Moderate Complexity: 1 Mod PT Treatments $Gait Training: 8-22 mins PT General Charges $$ ACUTE PT VISIT: 1 Visit         Lenoard SQUIBB, PT Acute Rehabilitation Services Office: [phone]   Lenoard NOVAK Sharel Behne 08/18/2023, 10:16 AM

## 2023-08-18 NOTE — Progress Notes (Signed)
 PHARMACY - ANTICOAGULATION CONSULT NOTE  Pharmacy Consult for warfarin Indication: MVR and chronic afib  Allergies  Allergen Reactions   Iodinated Contrast Media Rash    Pt stated in the past had broken out in a rash on back and itching.    Esomeprazole Magnesium      REACTION: stomach upset, nausea   Patient Measurements: Weight: 67.6 kg (149 lb 0.5 oz)  Vital Signs: Temp: 97.9 F (36.6 C) (07/29 0732) Temp Source: Oral (07/29 0732) BP: 139/97 (07/29 0732) Pulse Rate: 103 (07/29 0732)  Labs: Recent Labs    08/16/23 0325 08/16/23 1505 08/17/23 0232 08/18/23 0215  HGB 9.2* 9.7* 9.9* 9.8*  HCT 30.4* 31.6* 32.6* 32.5*  PLT 231 255 272 266  LABPROT 86.4*  --  >90.0* 81.0*  INR 10.5*  --  >10.0* 9.6*  CREATININE 3.41*  --  3.42* 3.07*    Estimated Creatinine Clearance: 11.5 mL/min (A) (by C-G formula based on SCr of 3.07 mg/dL (H)).   Medical History: Past Medical History:  Diagnosis Date   Asthma    years ago   Chronic atrial fibrillation (HCC)    Dilated cardiomyopathy (HCC)    history of this, now resolved   Dysphagia    Hx   Fracture of tibial plateau 05/21/2015   GERD (gastroesophageal reflux disease)    GI bleed    Hemorrhoid 04/2014   bleeding   History of blood transfusion    Hypertension    Microcytic anemia    Nonsustained ventricular tachycardia (HCC)    Osteoporosis    Protein in urine    RBBB (right bundle branch block)    Rheumatic mitral valve disease    Assessment: 22 YOF presenting with weakness, Hx of afib and mechanical mitral valve replacement on warfarin PTA with INR on admission supratheraputic at 7.6.   PTA dosing: 5mg  on Tue/thur/Sat, 7.5mg  all other days  07/29 AM update: INR remains supratherapeutic at 9.6; no s/sx of overt bleeding seen. Hgb, Hct and Platelets are stable. Not planning to reverse  unless overt bleeding per cardiology given mMVR.   Goal of Therapy:  INR 2.5-3.5 Monitor platelets by anticoagulation protocol:  Yes   Plan:  Hold warfarin today Daily INR, s/s bleeding, CBC  Maurilio Patten, PharmD PGY1 Pharmacy Resident [facility] 08/18/2023 8:00 AM

## 2023-08-18 NOTE — Progress Notes (Signed)
 Mobility Specialist Progress Note:    08/18/23 1035  Mobility  Activity Pivoted/transferred from chair to bed  Level of Assistance Moderate assist, patient does 50-74%  Assistive Device Front wheel walker  Distance Ambulated (ft) 3 ft  Activity Response Tolerated well  Mobility Referral Yes  Mobility visit 1 Mobility  Mobility Specialist Start Time (ACUTE ONLY) 0944  Mobility Specialist Stop Time (ACUTE ONLY) 0954  Mobility Specialist Time Calculation (min) (ACUTE ONLY) 10 min   Received pt in chair, requesting to move to bed. Required ModA STS, otherwise tolerated well. No c/o and VSS throughout. Returned to bed with alarm on. Personal belongings within reach. All needs met.  Lavanda Pollack Mobility Specialist  Please contact via [employer] or  Rehab Office [phone]

## 2023-08-18 NOTE — Progress Notes (Addendum)
 Daily Progress Note Intern Pager: [phone]  [name] Medical record number: [id number] Date of birth: 01-May-1935 Age: 88 y.o. Gender: female  Primary Care Provider: [name] Camie CROME, MD Consultants: Cardiology Code Status: Full  Pt Overview and Major Events to Date:  7/26-admitted 7/29--transition from IV to p.o. diltiazem   Medical Decision Making:  88 year old female admitted for A-fib with RVR with concomitant UTI.  Warfarin held in the setting of INR greater than 10, now at 9.6.  Pertinent medical history includes A-fib, CKD, CHF, mitral valve replacement.  Patient's heart rate now stable in the 90s after transition to p.o. diltiazem , BP stable. Assessment & Plan Atrial fibrillation with RVR (HCC) Pt remains in Afib w/ HR now improved to 90s. Cards recommending metoprolol  tartrate 100mg  daily and dilt PO 30mg  Q8h. Echo showed severe dilation of b/l atria, LVEF 45-50% w/ diffuse, severe hypokinesis of the L ventricular wall. Mg> 2 K > 4.  - Appreciate ongoing Cards recs - Transition 30mg  Diltiazem  q8h PO.   - Change to metoprolol  tartrate 100mg  daily.  - Continue LR @ 62ml/hr for 24 hours  UTI (urinary tract infection) UA and sx consistent with UTI. Urine cx contaminated, defer repeat culture.  - Cont CTX 1g daily, day 4 - Fu urine cx - adjust abx as needed  - Bladder scan q shift given hx of neurogenic bladder w/ urinary retention, voiding well Hypertension BP stable overnight.  - Cont metoprolol  and diltiazem  per above - Cont holding home Imdur , Hydral, Lasix  iso low-normotensive BP - restart as indicated  History of mitral valve replacement - [employer] mechanical valve Abnormal INR INR remains elevated 9.6 this morning.  -Per cardiology, hold warfarin and monitor for signs of bleeding -AM CBC  -Not administering reversal agent given mechanical valve  -AM INR  Chronic health problem CKD3 - Cr improved to 3.07, LR @ 53ml/hr for 24 hours CHF - Lasix   40mg  daily   FEN/GI: Regular PPx: Holding warfarin Dispo:Home pending clinical improvement . Barriers include AKI, transitioning to oral rate control.   Subjective:  Pt reports NAEON. She denies chest pain, dyspnea, or pain this AM. She expresses that she's hoping she can start walking further w/ PT in future days, wants her heart to get stronger again.   Objective: Temp:  [98.2 F (36.8 C)-99.9 F (37.7 C)] 98.2 F (36.8 C) (07/29 0400) Pulse Rate:  [56-120] 97 (07/29 0400) Resp:  [20-35] 20 (07/29 0400) BP: (91-141)/(41-99) 128/80 (07/29 0400) SpO2:  [94 %-98 %] 95 % (07/29 0400) Weight:  [67.6 kg] 67.6 kg (07/29 0000) Physical Exam: General: Awake, alert, NAD. Communicates clearly, hard of hearing Cardio: Irregular rhythm, borderline tachycardic. Systolic murmur appreciated. 2+ radial and dorsalis pedis pulses b/l w/ good capillary refill. No LE edema noted. Resp: CTA bilaterally. No wheezes, rales, or rhonchi. Mildly tachypneic on 1L [place] (seen after walking w/ PT).  Abdomen: soft, non-tender, non-distended. Normoactive BS auscultated. No guarding, rigidity, or rebound.   Laboratory: Most recent CBC Lab Results  Component Value Date   WBC 12.1 (H) 08/18/2023   HGB 9.8 (L) 08/18/2023   HCT 32.5 (L) 08/18/2023   MCV 87.4 08/18/2023   PLT 266 08/18/2023   Most recent BMP    Latest Ref Rng & Units 08/18/2023    2:15 AM  BMP  Glucose 70 - 99 mg/dL 852   BUN 8 - 23 mg/dL 58   Creatinine 9.55 - 1.00 mg/dL 6.92   Sodium 864 -  145 mmol/L 135   Potassium 3.5 - 5.1 mmol/L 4.2   Chloride 98 - 111 mmol/L 107   CO2 22 - 32 mmol/L 20   Calcium 8.9 - 10.3 mg/dL 8.6   INR 9.6  Imaging/Diagnostic Tests: None  Manon Jester, DO 08/18/2023, 7:11 AM  PGY-1, [facility] FPTS Intern pager: [phone], text pages welcome Secure chat group [facility] Teaching Service

## 2023-08-18 NOTE — Assessment & Plan Note (Addendum)
 UA and sx consistent with UTI. Urine cx contaminated, defer repeat culture.  - Cont CTX 1g daily, day 4 - Fu urine cx - adjust abx as needed  - Bladder scan q shift given hx of neurogenic bladder w/ urinary retention, voiding well

## 2023-08-18 NOTE — Progress Notes (Signed)
 Rounding Note   Patient Name: ADISON REIFSTECK Date of Encounter: 08/18/2023  [facility] Cardiologist: Danelle Birmingham, MD   Subjective  Ambulated earlier, weak but better  Scheduled Meds:  dorzolamide -timolol   1 drop Both Eyes BID   furosemide   40 mg Oral Daily   latanoprost   1 drop Both Eyes QHS   metoprolol  tartrate  75 mg Oral BID   pantoprazole   40 mg Oral Daily   tamsulosin   0.4 mg Oral Daily   Warfarin - Pharmacist Dosing Inpatient   Does not apply q1600   Continuous Infusions:  cefTRIAXone  (ROCEPHIN )  IV Stopped (08/17/23 1146)   diltiazem  (CARDIZEM ) infusion 10 mg/hr (08/18/23 0752)   PRN Meds: acetaminophen    Vital Signs  Vitals:   08/17/23 2100 08/18/23 0000 08/18/23 0400 08/18/23 0732  BP: 134/67 125/77 128/80 (!) 139/97  Pulse: (!) 109 97 97 (!) 103  Resp: (!) 34 20 20 17   Temp:  98.2 F (36.8 C) 98.2 F (36.8 C) 97.9 F (36.6 C)  TempSrc:  Oral Oral Oral  SpO2: 97% 97% 95% 95%  Weight:  67.6 kg      Intake/Output Summary (Last 24 hours) at 08/18/2023 0851 Last data filed at 08/18/2023 0454 Gross per 24 hour  Intake 1876.71 ml  Output 1800 ml  Net 76.71 ml      08/18/2023   12:00 AM 08/17/2023    6:00 AM 08/16/2023    5:00 AM  Last 3 Weights  Weight (lbs) 149 lb 0.5 oz 149 lb 4 oz 147 lb 0.8 oz  Weight (kg) 67.6 kg 67.7 kg 66.7 kg      Telemetry Atrial fibrillation with bundle branch block rate generally < 100 bpm - does go up to 120 with ambulation- Personally Reviewed  ECG  Atrial fibrillation with RBBB - Personally Reviewed  Physical Exam  GEN: No acute distress.   Neck: No JVD Cardiac: irregular, gr 2/6 systolic murmur RUSB to apex, no rubs, or gallops.  Respiratory: Clear to auscultation bilaterally. GI: Soft, nontender, non-distended  MS: No edema; No deformity. Neuro:  Nonfocal  Psych: Normal affect   Labs High Sensitivity Troponin:  No results for input(s): TROPONINIHS in the last 720 hours.   Chemistry Recent  Labs  Lab 08/15/23 1130 08/16/23 0325 08/17/23 0232 08/18/23 0215  NA 132* 135 136 135  K 3.4* 4.0 4.6 4.2  CL 101 107 108 107  CO2 19* 19* 20* 20*  GLUCOSE 110* 105* 119* 147*  BUN 78* 72* 62* 58*  CREATININE 4.16* 3.41* 3.42* 3.07*  CALCIUM 8.3* 8.2* 8.6* 8.6*  MG 2.0 2.2 2.3 2.2  PROT 7.3  --   --   --   ALBUMIN 2.4*  --   --   --   AST 21  --   --   --   ALT 11  --   --   --   ALKPHOS 46  --   --   --   BILITOT 0.9  --   --   --   GFRNONAA 10* 13* 13* 14*  ANIONGAP 12 9 8 8     Lipids No results for input(s): CHOL, TRIG, HDL, LABVLDL, LDLCALC, CHOLHDL in the last 168 hours.  Hematology Recent Labs  Lab 08/16/23 1505 08/17/23 0232 08/18/23 0215  WBC 13.4* 13.0* 12.1*  RBC 3.72* 3.78* 3.72*  HGB 9.7* 9.9* 9.8*  HCT 31.6* 32.6* 32.5*  MCV 84.9 86.2 87.4  MCH 26.1 26.2 26.3  MCHC 30.7 30.4 30.2  RDW 14.8 15.1 15.4  PLT 255 272 266   Thyroid  No results for input(s): TSH, FREET4 in the last 168 hours.  BNPNo results for input(s): BNP, PROBNP in the last 168 hours.  DDimer No results for input(s): DDIMER in the last 168 hours.   Radiology  DG CHEST PORT 1 VIEW Result Date: 08/16/2023 CLINICAL DATA:  Dyspnea. EXAM: PORTABLE CHEST 1 VIEW COMPARISON:  08/15/2023 FINDINGS: Stable marked cardiomegaly. Prior median sternotomy. Both lungs are clear. IMPRESSION: Stable marked cardiomegaly. No active lung disease. Electronically Signed   By: Norleen DELENA Kil M.D.   On: 08/16/2023 18:20   ECHOCARDIOGRAM COMPLETE Result Date: 08/16/2023    ECHOCARDIOGRAM REPORT   Patient Name:   VALEREE LEIDY Date of Exam: 08/16/2023 Medical Rec #:  [id number]      Height:       60.0 in Accession #:    [id number]     Weight:       147.0 lb Date of Birth:  1935/08/11      BSA:          1.638 m Patient Age:    88 years       BP:           130/68 mmHg Patient Gender: F              HR:           100 bpm. Exam Location:  Inpatient Procedure: 2D Echo (Both Spectral and Color Flow  Doppler were utilized during            procedure). Indications:     atrial fibrillation  History:         Patient has prior history of Echocardiogram examinations, most                  recent 04/05/2022. Cardiomyopathy, Rheumatic heart disease.                  chronic kidney disease, Arrythmias:Atrial Fibrillation and                  RBBB; Risk Factors:Hypertension.                   Mitral Valve: St. Jude valve is present in the mitral position.                  Procedure Date: 72.  Sonographer:     Tinnie Barefoot RDCS Referring Phys:  [id number] VINIE BROCKS HILTY Diagnosing Phys: VINIE Maxcy MD IMPRESSIONS  1. Left ventricular ejection fraction, by estimation, is 45 to 50%. The left ventricle has mildly decreased function. The left ventricle demonstrates regional wall motion abnormalities (see scoring diagram/findings for description). There is moderate left ventricular hypertrophy. Left ventricular diastolic function could not be evaluated. There is severe hypokinesis of the left ventricular, basal-mid lateral wall, inferolateral wall and inferior wall.  2. Right ventricular systolic function is mildly reduced. The right ventricular size is normal. There is severely elevated pulmonary artery systolic pressure. The estimated right ventricular systolic pressure is 60.4 mmHg.  3. Left atrial size was severely dilated.  4. Right atrial size was severely dilated.  5. The mitral valve has been repaired/replaced. Trivial mitral valve regurgitation. The mean mitral valve gradient is 5.0 mmHg. There is a St. Jude present in the mitral position. Procedure Date: 50. Echo findings are consistent with normal structure and function of the mitral valve prosthesis.  6. The aortic valve is calcified. There  is moderate calcification of the aortic valve. Aortic valve regurgitation is trivial. Moderate to severe aortic valve stenosis. Aortic valve area, by VTI measures 0.68 cm. Aortic valve mean gradient measures 22.5  mmHg.  Aortic valve Vmax measures 3.13 m/s. Peak gradient 39.1 mmHg, DI 0.30.  7. The inferior vena cava is normal in size with <50% respiratory variability, suggesting right atrial pressure of 8 mmHg. Comparison(s): Changes from prior study are noted. 04/05/2022: LVEF 55%, mild to moderate AS - DI 30, MVR MG 2 mmHg. Conclusion(s)/Recommendation(s): Regional wall motion abnormality noted on prior echo - noted fixed inferior/inferolateral defect on prior stress testing, however, coronary anatomy was normal by prior cath. FINDINGS  Left Ventricle: Left ventricular ejection fraction, by estimation, is 45 to 50%. The left ventricle has mildly decreased function. The left ventricle demonstrates regional wall motion abnormalities. Severe hypokinesis of the left ventricular, basal-mid lateral wall, inferolateral wall and inferior wall. The left ventricular internal cavity size was normal in size. There is moderate left ventricular hypertrophy. Left ventricular diastolic function could not be evaluated due to mitral valve replacement. Left ventricular diastolic function could not be evaluated.  LV Wall Scoring: The entire lateral wall is hypokinetic. Right Ventricle: The right ventricular size is normal. No increase in right ventricular wall thickness. Right ventricular systolic function is mildly reduced. There is severely elevated pulmonary artery systolic pressure. The tricuspid regurgitant velocity is 3.62 m/s, and with an assumed right atrial pressure of 8 mmHg, the estimated right ventricular systolic pressure is 60.4 mmHg. Left Atrium: Left atrial size was severely dilated. Right Atrium: Right atrial size was severely dilated. Pericardium: There is no evidence of pericardial effusion. Mitral Valve: The mitral valve has been repaired/replaced. Trivial mitral valve regurgitation. There is a St. Jude present in the mitral position. Procedure Date: 56. Echo findings are consistent with normal structure and function of the  mitral valve prosthesis. MV peak gradient, 10.5 mmHg. The mean mitral valve gradient is 5.0 mmHg with average heart rate of 111 bpm. Tricuspid Valve: The tricuspid valve is grossly normal. Tricuspid valve regurgitation is mild. Aortic Valve: The aortic valve is calcified. There is moderate calcification of the aortic valve. Aortic valve regurgitation is trivial. Moderate to severe aortic stenosis is present. Aortic valve mean gradient measures 22.5 mmHg. Aortic valve peak gradient measures 39.1 mmHg. Aortic valve area, by VTI measures 0.68 cm. Pulmonic Valve: The pulmonic valve was normal in structure. Pulmonic valve regurgitation is not visualized. Aorta: The aortic root and ascending aorta are structurally normal, with no evidence of dilitation. Venous: The inferior vena cava is normal in size with less than 50% respiratory variability, suggesting right atrial pressure of 8 mmHg. IAS/Shunts: No atrial level shunt detected by color flow Doppler.  LEFT VENTRICLE PLAX 2D LVIDd:         4.30 cm LVIDs:         3.10 cm LV PW:         1.10 cm LV IVS:        1.10 cm LVOT diam:     1.70 cm LV SV:         34 LV SV Index:   21 LVOT Area:     2.27 cm  LV Volumes (MOD) LV vol d, MOD A2C: 75.5 ml LV vol d, MOD A4C: 88.5 ml LV vol s, MOD A2C: 33.6 ml LV vol s, MOD A4C: 60.3 ml LV SV MOD A2C:     42.0 ml LV SV MOD A4C:  88.5 ml LV SV MOD BP:      38.4 ml RIGHT VENTRICLE          IVC RV Basal diam:  4.00 cm  IVC diam: 1.70 cm TAPSE (M-mode): 1.3 cm LEFT ATRIUM            Index         RIGHT ATRIUM           Index LA diam:      5.00 cm  3.05 cm/m    RA Area:     39.80 cm LA Vol (A4C): 182.5 ml 111.42 ml/m  RA Volume:   172.00 ml 105.01 ml/m  AORTIC VALVE AV Area (Vmax):    0.71 cm AV Area (Vmean):   0.67 cm AV Area (VTI):     0.68 cm AV Vmax:           312.75 cm/s AV Vmean:          219.250 cm/s AV VTI:            0.497 m AV Peak Grad:      39.1 mmHg AV Mean Grad:      22.5 mmHg LVOT Vmax:         97.93 cm/s LVOT Vmean:         65.125 cm/s LVOT VTI:          0.149 m LVOT/AV VTI ratio: 0.30  AORTA Ao Asc diam: 3.10 cm MITRAL VALVE              TRICUSPID VALVE MV Area VTI:  1.52 cm    TR Peak grad:   52.4 mmHg MV Peak grad: 10.5 mmHg   TR Vmax:        362.00 cm/s MV Mean grad: 5.0 mmHg MV Vmax:      1.62 m/s    SHUNTS MV Vmean:     106.8 cm/s  Systemic VTI:  0.15 m                           Systemic Diam: 1.70 cm Vinie Maxcy MD Electronically signed by Vinie Maxcy MD Signature Date/Time: 08/16/2023/10:50:57 AM    Final (Updated)     Cardiac Studies Echo 08/16/2023  1. Left ventricular ejection fraction, by estimation, is 45 to 50%. The  left ventricle has mildly decreased function. The left ventricle  demonstrates regional wall motion abnormalities (see scoring  diagram/findings for description). There is moderate  left ventricular hypertrophy. Left ventricular diastolic function could  not be evaluated. There is severe hypokinesis of the left ventricular,  basal-mid lateral wall, inferolateral wall and inferior wall.   2. Right ventricular systolic function is mildly reduced. The right  ventricular size is normal. There is severely elevated pulmonary artery  systolic pressure. The estimated right ventricular systolic pressure is  60.4 mmHg.   3. Left atrial size was severely dilated.   4. Right atrial size was severely dilated.   5. The mitral valve has been repaired/replaced. Trivial mitral valve  regurgitation. The mean mitral valve gradient is 5.0 mmHg. There is a St.  Jude present in the mitral position. Procedure Date: 33. Echo findings  are consistent with normal structure  and function of the mitral valve prosthesis.   6. The aortic valve is calcified. There is moderate calcification of the  aortic valve. Aortic valve regurgitation is trivial. Moderate to severe  aortic valve stenosis. Aortic valve area, by VTI measures 0.68  cm. Aortic  valve mean gradient measures 22.5   mmHg. Aortic valve  Vmax measures 3.13 m/s. Peak gradient 39.1 mmHg, DI  0.30.   7. The inferior vena cava is normal in size with <50% respiratory  variability, suggesting right atrial pressure of 8 mmHg.   Comparison(s): Changes from prior study are noted. 04/05/2022: LVEF 55%,  mild to moderate AS - DI 30, MVR MG 2 mmHg.   Conclusion(s)/Recommendation(s): Regional wall motion abnormality noted on  prior echo - noted fixed inferior/inferolateral defect on prior stress  testing, however, coronary anatomy was normal by prior cath.   Patient Profile   88 y.o. female with PMH of MS s/p MVR 1987, permanent atrial fibrillation, NICM with improved EF (normal cors on cath 04/02/2021), chronic diastolic CHF, CKD stage III, HTN, chronic anemia and NSVT who presented with UTI and dehydration and was in afib with RVR. Cr peaked at 4.16. INR was 10.   Assessment & Plan   Permanent atrial fibrillation with RVR  - on metoprolol  succinate 100mg  daily at home. Will resume home dose. Rate control is currently acceptable.  - fast heart rate likely driven by underlying illness.  - will stop IV diltiazem  - will need some orally for now but hopefully we can manage with metoprolol  only in the long run given LV dysfunction  Moderate to severe aortic stenosis  - was previously mild to moderate AS on echo in March 2024. Now moderate to severe AS.  - monitor for sign of volume overload. Consider outpatient evaluation by structural heart clinic  H/o mitral valve replacement: stable on echo.  NICM: EF 45%.   Hypertension  6.   Anticoagulation. Still supratherapeutic with INR 9.6. followed by pharmacy.     For questions or updates, please contact [facility] Please consult [email or web address] for contact info under     Signed, Icholas Irby [place], MD  08/18/2023, 8:51 AM

## 2023-08-18 NOTE — Assessment & Plan Note (Addendum)
 CKD3 - Cr improved to 3.07, LR @ 4ml/hr for 24 hours CHF - Lasix  40mg  daily

## 2023-08-18 NOTE — Assessment & Plan Note (Addendum)
 Pt remains in Afib w/ HR now improved to 90s. Cards recommending metoprolol  tartrate 100mg  daily and dilt PO 30mg  Q8h. Echo showed severe dilation of b/l atria, LVEF 45-50% w/ diffuse, severe hypokinesis of the L ventricular wall. Mg> 2 K > 4.  - Appreciate ongoing Cards recs - Transition 30mg  Diltiazem  q8h PO.   - Change to metoprolol  tartrate 100mg  daily.  - Continue LR @ 90ml/hr for 24 hours

## 2023-08-18 NOTE — Evaluation (Signed)
 Occupational Therapy Evaluation Patient Details Name: [name] MRN: [id number] DOB: 1935-08-19 Today's Date: 08/18/2023   History of Present Illness   88 y.o. female adm 08/15/23 with weakness in Afib with RVR and UTI. PMHx: CM, HFrEF, AVR, Afib, Rt THA, CKD, HTN     Clinical Impressions PT admitted with Afib with RVR. Pt currently with functional limitiations due to the deficits listed below (see OT problem list). Pt demonstrates decreased Fi02 with static sitting eob and DOE 3 out 4. Pt with R lateral lean and able to correct but not sustain. Pt returning to supine due to fatigue. Pt reports two adult daughters that can help at d/c.  Pt will benefit from skilled OT to increase their independence and safety with adls and balance to allow discharge HHOT.      If plan is discharge home, recommend the following:   A lot of help with bathing/dressing/bathroom     Functional Status Assessment   Patient has had a recent decline in their functional status and demonstrates the ability to make significant improvements in function in a reasonable and predictable amount of time.     Equipment Recommendations   BSC/3in1;Wheelchair (measurements OT);Other (comment) (RW)     Recommendations for Other Services   PT consult     Precautions/Restrictions   Precautions Precautions: Fall;Other (comment) Recall of Precautions/Restrictions: Impaired Precaution/Restrictions Comments: watch sats Restrictions Weight Bearing Restrictions Per Provider Order: No     Mobility Bed Mobility Overal bed mobility: Needs Assistance Bed Mobility: Supine to Sit, Sit to Supine     Supine to sit: Min assist Sit to supine: Min assist   General bed mobility comments: (A) with trunk from surface    Transfers Overall transfer level: Needs assistance   Transfers: Sit to/from Stand Sit to Stand: Min assist           General transfer comment: pt scooting toward the [facility] supervision  level .      Balance Overall balance assessment: Needs assistance Sitting-balance support: Bilateral upper extremity supported, Feet supported Sitting balance-Leahy Scale: Poor                                     ADL either performed or assessed with clinical judgement   ADL Overall ADL's : Needs assistance/impaired Eating/Feeding: Set up;Sitting   Grooming: Oral care;Wash/dry face;Wash/dry hands;Set up;Sitting Grooming Details (indicate cue type and reason): pt leaning laterally to the R with fatigue sitting eob Upper Body Bathing: Minimal assistance;Bed level   Lower Body Bathing: Moderate assistance                         General ADL Comments: pt asleep on arrival. pt given time to help with waking up and then asked to sit EOB for grooming task. pt with increased DOE 3 out 4 with O2 91% RA and 96% on 1L     Vision         Perception         Praxis         Pertinent Vitals/Pain Pain Assessment Pain Assessment: No/denies pain     Extremity/Trunk Assessment Upper Extremity Assessment Upper Extremity Assessment: Generalized weakness   Lower Extremity Assessment Lower Extremity Assessment: Generalized weakness   Cervical / Trunk Assessment Cervical / Trunk Assessment: Normal   Communication Communication Communication: Impaired Factors Affecting Communication: Hearing impaired   Cognition  Arousal: Alert Behavior During Therapy: WFL for tasks assessed/performed Cognition: No family/caregiver present to determine baseline             OT - Cognition Comments: intact for functional task assessed                 Following commands: Intact       Cueing  General Comments      1L [place] with decrease from 99-96 to 90-91% on RA. pt with DOE 3 out 4 with long pauses between words   Exercises     Shoulder Instructions      Home Living Family/patient expects to be discharged to:: Private residence Living Arrangements:  Alone Available Help at Discharge: Family;Available PRN/intermittently Type of Home: Apartment Home Access: Level entry     Home Layout: One level     Bathroom Shower/Tub: Chief Strategy Officer: Handicapped height Bathroom Accessibility: Yes How Accessible: Accessible via walker Home Equipment: Rollator (4 wheels);Shower seat;Wheelchair - manual;Grab bars - tub/shower   Additional Comments: senior living apt and has 2 adult daughters that do not work that can help      Prior Functioning/Environment Prior Level of Function : Independent/Modified Independent             Mobility Comments: uses a rollator at baseline ADLs Comments: daughter assists with IADLs    OT Problem List: Decreased activity tolerance;Decreased knowledge of precautions;Decreased knowledge of use of DME or AE;Decreased safety awareness;Cardiopulmonary status limiting activity   OT Treatment/Interventions:        OT Goals(Current goals can be found in the care plan section)   Acute Rehab OT Goals Patient Stated Goal: to lay down OT Goal Formulation: With patient Time For Goal Achievement: 09/01/23 Potential to Achieve Goals: Good   OT Frequency:  Min 2X/week    Co-evaluation              AM-PAC OT 6 Clicks Daily Activity     Outcome Measure Help from another person eating meals?: A Little Help from another person taking care of personal grooming?: A Little Help from another person toileting, which includes using toliet, bedpan, or urinal?: A Lot Help from another person bathing (including washing, rinsing, drying)?: A Lot Help from another person to put on and taking off regular upper body clothing?: A Lot Help from another person to put on and taking off regular lower body clothing?: Total 6 Click Score: 13   End of Session Equipment Utilized During Treatment: Oxygen Nurse Communication: Mobility status;Precautions  Activity Tolerance: Patient limited by  fatigue Patient left: in bed;with call bell/phone within reach;with bed alarm set  OT Visit Diagnosis: Unsteadiness on feet (R26.81)                Time: 8893-8875 OT Time Calculation (min): 18 min Charges:  OT General Charges $OT Visit: 1 Visit OT Evaluation $OT Eval Moderate Complexity: 1 Mod   Brynn, OTR/L  Acute Rehabilitation Services Office: [phone] .   Ely Molt 08/18/2023, 1:52 PM

## 2023-08-19 DIAGNOSIS — I5042 Chronic combined systolic (congestive) and diastolic (congestive) heart failure: Secondary | ICD-10-CM | POA: Diagnosis not present

## 2023-08-19 DIAGNOSIS — Z952 Presence of prosthetic heart valve: Secondary | ICD-10-CM | POA: Diagnosis not present

## 2023-08-19 DIAGNOSIS — I4891 Unspecified atrial fibrillation: Secondary | ICD-10-CM | POA: Diagnosis not present

## 2023-08-19 DIAGNOSIS — I482 Chronic atrial fibrillation, unspecified: Secondary | ICD-10-CM | POA: Diagnosis not present

## 2023-08-19 LAB — BASIC METABOLIC PANEL WITH GFR
Anion gap: 10 (ref 5–15)
BUN: 56 mg/dL — ABNORMAL HIGH (ref 8–23)
CO2: 16 mmol/L — ABNORMAL LOW (ref 22–32)
Calcium: 8.7 mg/dL — ABNORMAL LOW (ref 8.9–10.3)
Chloride: 111 mmol/L (ref 98–111)
Creatinine, Ser: 3.12 mg/dL — ABNORMAL HIGH (ref 0.44–1.00)
GFR, Estimated: 14 mL/min — ABNORMAL LOW (ref >60)
Glucose, Bld: 113 mg/dL — ABNORMAL HIGH (ref 70–99)
Potassium: 4.5 mmol/L (ref 3.5–5.1)
Sodium: 137 mmol/L (ref 135–145)

## 2023-08-19 LAB — MAGNESIUM: Magnesium: 2.1 mg/dL (ref 1.7–2.4)

## 2023-08-19 LAB — PROTIME-INR
INR: 9.4 (ref 0.8–1.2)
Prothrombin Time: 79.2 s — ABNORMAL HIGH (ref 11.4–15.2)

## 2023-08-19 MED ORDER — TAMSULOSIN HCL 0.4 MG PO CAPS
0.8000 mg | ORAL_CAPSULE | Freq: Every day | ORAL | Status: DC
  Administered 2023-08-20: 0.8 mg via ORAL
  Filled 2023-08-19: qty 2

## 2023-08-19 MED ORDER — DILTIAZEM HCL ER COATED BEADS 180 MG PO CP24
180.0000 mg | ORAL_CAPSULE | Freq: Every day | ORAL | Status: DC
  Administered 2023-08-19 – 2023-08-20 (×2): 180 mg via ORAL
  Filled 2023-08-19 (×2): qty 1

## 2023-08-19 NOTE — Assessment & Plan Note (Addendum)
 CKD3 - Cr plateauing 3.12, monitor outpatient CHF - Lasix  40mg  daily

## 2023-08-19 NOTE — Progress Notes (Signed)
 Occupational Therapy Treatment Patient Details Name: [name] MRN: [id number] DOB: 1935-11-29 Today's Date: 08/19/2023   History of present illness 88 y.o. female adm 08/15/23 with weakness in Afib with RVR and UTI. PMHx: CM, HFrEF, AVR, Afib, Rt THA, CKD, HTN   OT comments  Pt with cognitive deficits noted this session during ADls and no awareness to session previous date 7/29. Pt needs cues during adls to sequence and reports fatigue at the conclusion of session. Pt with DOE 2 out 4 with seated bathing at sink. Hand out for energy conservation left in the room this session. Recommendation for HHOT as long as family can manage.       If plan is discharge home, recommend the following:  A lot of help with bathing/dressing/bathroom   Equipment Recommendations  BSC/3in1;Wheelchair (measurements OT);Other (comment)    Recommendations for Other Services PT consult    Precautions / Restrictions Precautions Precautions: Fall;Other (comment) Recall of Precautions/Restrictions: Impaired Precaution/Restrictions Comments: watch BP and SpO2 Restrictions Weight Bearing Restrictions Per Provider Order: No       Mobility Bed Mobility               General bed mobility comments: up with PT on arrival and transfered to sink level to start OT session    Transfers Overall transfer level: Needs assistance Equipment used: Rolling walker (2 wheels) Transfers: Sit to/from Stand Sit to Stand: Mod assist                 Balance Overall balance assessment: Needs assistance Sitting-balance support: No upper extremity supported, Feet supported Sitting balance-Leahy Scale: Fair     Standing balance support: Bilateral upper extremity supported, During functional activity, Reliant on assistive device for balance Standing balance-Leahy Scale: Poor                             ADL either performed or assessed with clinical judgement   ADL Overall ADL's : Needs  assistance/impaired Eating/Feeding: Set up;Sitting   Grooming: Oral care;Wash/dry face;Wash/dry hands;Set up;Sitting Grooming Details (indicate cue type and reason): needs cues to continue. needs cues to sequence Upper Body Bathing: Minimal assistance;Sitting   Lower Body Bathing: Minimal assistance;Sit to/from stand Lower Body Bathing Details (indicate cue type and reason): (A) for standing stability Upper Body Dressing : Set up;Sitting   Lower Body Dressing: Moderate assistance;Sit to/from stand Lower Body Dressing Details (indicate cue type and reason): cues to pull up and down brief Toilet Transfer: Moderate assistance;Rolling walker (2 wheels) Toilet Transfer Details (indicate cue type and reason): chair with no arm rest needs(A) to power out of chair Toileting- Clothing Manipulation and Hygiene: Minimal assistance       Functional mobility during ADLs: Minimal assistance      Extremity/Trunk Assessment Upper Extremity Assessment Upper Extremity Assessment: Generalized weakness   Lower Extremity Assessment Lower Extremity Assessment: Generalized weakness   Cervical / Trunk Assessment Cervical / Trunk Assessment: Normal    Vision       Perception     Praxis     Communication Communication Communication: Impaired Factors Affecting Communication: Hearing impaired   Cognition Arousal: Alert Behavior During Therapy: WFL for tasks assessed/performed Cognition: Cognition impaired     Awareness: Intellectual awareness intact, Online awareness impaired       OT - Cognition Comments: pt states Where is my daugther? OT informs remember she is going to get something to drink she will be right back. pt  states no you need to take me to her i am leaving with her today and need to rest. Pt appears to have poor awareness of hospitalization at this time. pt also needs cues during ADLS missing steps                 Following commands: Intact        Cueing   Cueing  Techniques: Verbal cues  Exercises      Shoulder Instructions       General Comments 88-89 RA on 1L 93%. x2 daughters present at the end of session and educated on pt with some cognitive confusion this session. pt had not awareness of session on 7/29    Pertinent Vitals/ Pain       Pain Assessment Pain Assessment: No/denies pain  Home Living Family/patient expects to be discharged to:: Private residence Living Arrangements: Alone Available Help at Discharge: Family;Available PRN/intermittently Type of Home: Apartment Home Access: Level entry     Home Layout: One level     Bathroom Shower/Tub: Chief Strategy Officer: Handicapped height Bathroom Accessibility: Yes   Home Equipment: Rollator (4 wheels);Shower seat;Wheelchair - manual;Grab bars - tub/shower   Additional Comments: senior living apt and has 2 adult daughters that do not work that can help      Prior Functioning/Environment              Frequency  Min 2X/week        Progress Toward Goals  OT Goals(current goals can now be found in the care plan section)  Progress towards OT goals: Progressing toward goals  Acute Rehab OT Goals Patient Stated Goal: to lay down OT Goal Formulation: With patient/family Time For Goal Achievement: 09/01/23 Potential to Achieve Goals: Good ADL Goals Pt Will Perform Lower Body Dressing: with modified independence;sit to/from stand;with adaptive equipment Pt Will Transfer to Toilet: with modified independence;ambulating;bedside commode Additional ADL Goal #1: pt will demonstrate two methods of energy conservation during ADl Additional ADL Goal #2: pt will demonstrate EOB sitting supervision for 5 minutes  Plan      Co-evaluation                 AM-PAC OT 6 Clicks Daily Activity     Outcome Measure   Help from another person eating meals?: A Little Help from another person taking care of personal grooming?: A Little Help from another person  toileting, which includes using toliet, bedpan, or urinal?: A Lot Help from another person bathing (including washing, rinsing, drying)?: A Lot Help from another person to put on and taking off regular upper body clothing?: A Lot Help from another person to put on and taking off regular lower body clothing?: A Lot 6 Click Score: 14    End of Session Equipment Utilized During Treatment: Oxygen;Gait belt;Rolling walker (2 wheels)  OT Visit Diagnosis: Unsteadiness on feet (R26.81)   Activity Tolerance Patient limited by fatigue   Patient Left in chair;with call bell/phone within reach;with chair alarm set;with family/visitor present   Nurse Communication Mobility status;Precautions        Time: 9156-9085 OT Time Calculation (min): 31 min  Charges: OT General Charges $OT Visit: 1 Visit OT Treatments $Self Care/Home Management : 23-37 mins   Brynn, OTR/L  Acute Rehabilitation Services Office: [phone] .   Ely Molt 08/19/2023, 10:49 AM

## 2023-08-19 NOTE — Assessment & Plan Note (Addendum)
 UA and sx consistent with UTI. Urine cx contaminated, defer repeat culture.  - Finish CTX 1g daily, day 5 - Bladder scan q shift given hx of neurogenic bladder w/ urinary retention, voiding well

## 2023-08-19 NOTE — Assessment & Plan Note (Addendum)
 Pt remains in Afib w/ HR now improved to 90s. Cards recommending metoprolol  tartrate 100mg  2x daily and dilt 24 hr capsule 180mg  daily. Echo showed severe dilation of b/l atria, LVEF 45-50% w/ diffuse, severe hypokinesis of the L ventricular wall. Mg> 2 K > 4.  - Appreciate ongoing Cards recs - Change to 180mg  Diltiazem  24hr capsule daily.   - Change to metoprolol  tartrate 100mg  2x daily.  - Discontinue IV fluids

## 2023-08-19 NOTE — Assessment & Plan Note (Addendum)
 INR elevated but downtrending at 9.4 this morning.  -Per cardiology, hold warfarin and monitor for signs of bleeding -AM CBC  -Not administering reversal agent given mechanical valve  -AM INR

## 2023-08-19 NOTE — Plan of Care (Signed)
  Problem: Clinical Measurements: Goal: Diagnostic test results will improve Outcome: Progressing   Problem: Activity: Goal: Risk for activity intolerance will decrease Outcome: Progressing   Problem: Coping: Goal: Level of anxiety will decrease Outcome: Progressing   Problem: Safety: Goal: Ability to remain free from injury will improve Outcome: Progressing   

## 2023-08-19 NOTE — Progress Notes (Signed)
 Rounding Note   [name] Date of Encounter: 08/19/2023  [facility] Cardiologist: Danelle Birmingham, MD   Subjective  Still very weak. Denies SOB. Sats 88-90% on RA with ambulation  Scheduled Meds:  diltiazem   30 mg Oral Q8H   dorzolamide -timolol   1 drop Both Eyes BID   furosemide   40 mg Oral Daily   latanoprost   1 drop Both Eyes QHS   metoprolol  tartrate  100 mg Oral BID   pantoprazole   40 mg Oral Daily   tamsulosin   0.4 mg Oral Daily   Warfarin - Pharmacist Dosing Inpatient   Does not apply q1600   Continuous Infusions:  cefTRIAXone  (ROCEPHIN )  IV 1 g (08/18/23 1154)   lactated ringers  70 mL/hr at 08/18/23 1816   PRN Meds: acetaminophen    Vital Signs  Vitals:   08/18/23 2330 08/19/23 0000 08/19/23 0400 08/19/23 0712  BP: 106/67 123/83 132/84 (!) 109/90  Pulse: 72 100 93 100  Resp: (!) 24 (!) 26 18 (!) 35  Temp: 97.6 F (36.4 C)  97.7 F (36.5 C) 98.3 F (36.8 C)  TempSrc: Oral  Oral Oral  SpO2: 96%  96% 95%  Weight:   68.9 kg     Intake/Output Summary (Last 24 hours) at 08/19/2023 0909 Last data filed at 08/19/2023 0800 Gross per 24 hour  Intake 1486.18 ml  Output 825 ml  Net 661.18 ml      08/19/2023    4:00 AM 08/18/2023   12:00 AM 08/17/2023    6:00 AM  Last 3 Weights  Weight (lbs) 151 lb 14.4 oz 149 lb 0.5 oz 149 lb 4 oz  Weight (kg) 68.9 kg 67.6 kg 67.7 kg      Telemetry Atrial fibrillation with bundle branch block rate generally < 100-110 bpm - does go up with ambulation- Personally Reviewed  ECG  Atrial fibrillation with RBBB - Personally Reviewed  Physical Exam  GEN: No acute distress.   Neck: No JVD Cardiac: irregular, gr 2/6 systolic murmur RUSB to apex, no rubs, or gallops.  Respiratory: Clear to auscultation bilaterally. GI: Soft, nontender, non-distended  MS: No edema; No deformity. Neuro:  Nonfocal  Psych: Normal affect   Labs High Sensitivity Troponin:  No results for input(s): TROPONINIHS in the  last 720 hours.   Chemistry Recent Labs  Lab 08/15/23 1130 08/16/23 0325 08/17/23 0232 08/18/23 0215 08/19/23 0230  NA 132*   < > 136 135 137  K 3.4*   < > 4.6 4.2 4.5  CL 101   < > 108 107 111  CO2 19*   < > 20* 20* 16*  GLUCOSE 110*   < > 119* 147* 113*  BUN 78*   < > 62* 58* 56*  CREATININE 4.16*   < > 3.42* 3.07* 3.12*  CALCIUM 8.3*   < > 8.6* 8.6* 8.7*  MG 2.0   < > 2.3 2.2 2.1  PROT 7.3  --   --   --   --   ALBUMIN 2.4*  --   --   --   --   AST 21  --   --   --   --   ALT 11  --   --   --   --   ALKPHOS 46  --   --   --   --   BILITOT 0.9  --   --   --   --   GFRNONAA 10*   < > 13* 14*  14*  ANIONGAP 12   < > 8 8 10    < > = values in this interval not displayed.    Lipids No results for input(s): CHOL, TRIG, HDL, LABVLDL, LDLCALC, CHOLHDL in the last 168 hours.  Hematology Recent Labs  Lab 08/16/23 1505 08/17/23 0232 08/18/23 0215  WBC 13.4* 13.0* 12.1*  RBC 3.72* 3.78* 3.72*  HGB 9.7* 9.9* 9.8*  HCT 31.6* 32.6* 32.5*  MCV 84.9 86.2 87.4  MCH 26.1 26.2 26.3  MCHC 30.7 30.4 30.2  RDW 14.8 15.1 15.4  PLT 255 272 266   Thyroid  No results for input(s): TSH, FREET4 in the last 168 hours.  BNPNo results for input(s): BNP, PROBNP in the last 168 hours.  DDimer No results for input(s): DDIMER in the last 168 hours.   Radiology  No results found.   Cardiac Studies Echo 08/16/2023  1. Left ventricular ejection fraction, by estimation, is 45 to 50%. The  left ventricle has mildly decreased function. The left ventricle  demonstrates regional wall motion abnormalities (see scoring  diagram/findings for description). There is moderate  left ventricular hypertrophy. Left ventricular diastolic function could  not be evaluated. There is severe hypokinesis of the left ventricular,  basal-mid lateral wall, inferolateral wall and inferior wall.   2. Right ventricular systolic function is mildly reduced. The right  ventricular size is normal. There  is severely elevated pulmonary artery  systolic pressure. The estimated right ventricular systolic pressure is  60.4 mmHg.   3. Left atrial size was severely dilated.   4. Right atrial size was severely dilated.   5. The mitral valve has been repaired/replaced. Trivial mitral valve  regurgitation. The mean mitral valve gradient is 5.0 mmHg. There is a St.  Jude present in the mitral position. Procedure Date: 4. Echo findings  are consistent with normal structure  and function of the mitral valve prosthesis.   6. The aortic valve is calcified. There is moderate calcification of the  aortic valve. Aortic valve regurgitation is trivial. Moderate to severe  aortic valve stenosis. Aortic valve area, by VTI measures 0.68 cm. Aortic  valve mean gradient measures 22.5   mmHg. Aortic valve Vmax measures 3.13 m/s. Peak gradient 39.1 mmHg, DI  0.30.   7. The inferior vena cava is normal in size with <50% respiratory  variability, suggesting right atrial pressure of 8 mmHg.   Comparison(s): Changes from prior study are noted. 04/05/2022: LVEF 55%,  mild to moderate AS - DI 30, MVR MG 2 mmHg.   Conclusion(s)/Recommendation(s): Regional wall motion abnormality noted on  prior echo - noted fixed inferior/inferolateral defect on prior stress  testing, however, coronary anatomy was normal by prior cath.   Patient Profile   88 y.o. female with PMH of MS s/p MVR 1987, permanent atrial fibrillation, NICM with improved EF (normal cors on cath 04/02/2021), chronic diastolic CHF, CKD stage III, HTN, chronic anemia and NSVT who presented with UTI and dehydration and was in afib with RVR. Cr peaked at 4.16. INR was 10.   Assessment & Plan   Permanent atrial fibrillation with RVR  - on metoprolol  100 mg bid  - increased heart rate likely driven by underlying illness.  - off IV diltiazem  since yesterday - still has some increased rates. Will consolidate to LA cardizem  today 180 mg daily. Given permanent  Afib I don't really see a role for amiodarone . Diltizem is not ideal with mildly reduce EF but will continue for now.   Moderate to severe  aortic stenosis  - was previously mild to moderate AS on echo in March 2024. Now moderate to severe AS.  - monitor for sign of volume overload. Will need to consider valve team evaluation depending on medical status as outpatient.   H/o mitral valve replacement: stable on echo.  NICM: EF 45%.   Hypertension  6.   Anticoagulation. Still supratherapeutic with INR 9.4. followed by pharmacy.     For questions or updates, please contact [facility] Please consult [email or web address] for contact info under     Signed, Chijioke Lasser [place], MD  08/19/2023, 9:09 AM

## 2023-08-19 NOTE — Assessment & Plan Note (Signed)
 BP stable overnight.  - Cont metoprolol  and diltiazem  per above - Cont holding home Imdur , Hydral, Lasix  iso low-normotensive BP - restart as indicated

## 2023-08-19 NOTE — Progress Notes (Signed)
 PHARMACY - ANTICOAGULATION CONSULT NOTE  Pharmacy Consult for warfarin Indication: MVR and chronic afib  Allergies  Allergen Reactions   Iodinated Contrast Media Rash    Pt stated in the past had broken out in a rash on back and itching.    Esomeprazole Magnesium      REACTION: stomach upset, nausea   Patient Measurements: Weight: 68.9 kg (151 lb 14.4 oz)  Vital Signs: Temp: 98.3 F (36.8 C) (07/30 0712) Temp Source: Oral (07/30 0712) BP: 109/90 (07/30 0712) Pulse Rate: 100 (07/30 0712)  Labs: Recent Labs    08/16/23 1505 08/17/23 0232 08/18/23 0215 08/19/23 0230  HGB 9.7* 9.9* 9.8*  --   HCT 31.6* 32.6* 32.5*  --   PLT 255 272 266  --   LABPROT  --  >90.0* 81.0* 79.2*  INR  --  >10.0* 9.6* 9.4*  CREATININE  --  3.42* 3.07* 3.12*    Estimated Creatinine Clearance: 11.4 mL/min (A) (by C-G formula based on SCr of 3.12 mg/dL (H)).   Medical History: Past Medical History:  Diagnosis Date   Asthma    years ago   Chronic atrial fibrillation (HCC)    Dilated cardiomyopathy (HCC)    history of this, now resolved   Dysphagia    Hx   Fracture of tibial plateau 05/21/2015   GERD (gastroesophageal reflux disease)    GI bleed    Hemorrhoid 04/2014   bleeding   History of blood transfusion    Hypertension    Microcytic anemia    Nonsustained ventricular tachycardia (HCC)    Osteoporosis    Protein in urine    RBBB (right bundle branch block)    Rheumatic mitral valve disease    Assessment: 8 YOF presenting with weakness, Hx of afib and mechanical mitral valve replacement on warfarin PTA with INR on admission supratheraputic at 7.6.   PTA dosing: 5mg  on Tue/thur/Sat, 7.5mg  all other days  07/30 AM update: INR remains supratherapeutic at 9.4; no s/sx of overt bleeding seen. Hgb, Hct and Platelets are stable. Not planning to reverse  unless overt bleeding per cardiology given mMVR.   Goal of Therapy:  INR 2.5-3.5 Monitor platelets by anticoagulation protocol:  Yes   Plan:  Hold warfarin today Daily INR, s/s bleeding, CBC  Maurilio Patten, PharmD PGY1 Pharmacy Resident [facility] 08/19/2023 8:55 AM

## 2023-08-19 NOTE — Discharge Instructions (Signed)
 Dear [name],  Thank you for letting us  participate in your care. You were hospitalized for a rapid heart rate and diagnosed with atrial fibrillation with rapid ventricular response. Your Warfarin was also stopped while you were in the hospital as you labs showed our blood was to thin. You were treated with a new medicine called diltiazem  and this was added to your medicines.   POST-HOSPITAL & CARE INSTRUCTIONS If you have new chest pain, dizziness, lightheadedness, or rapid heart rate please return to be seen. It is very important to make sure you follow-up with your heart and regular doctor to see how you are doing after leaving the hospital. Go to your follow up appointments (listed below)   DOCTOR'S APPOINTMENT   Future Appointments  Date Time Provider Department Center  09/03/2023  4:50 PM [facility] ANNUAL [facility]ERDIE VISIT [facility]  09/16/2023  9:45 AM [name], DPM [facility]     Take care and be well!  Family Medicine Teaching Service Inpatient Team [place]  [facility]  [address] [place], [place] [address] [phone]

## 2023-08-19 NOTE — Progress Notes (Signed)
 Physical Therapy Treatment Patient Details Name: [name] MRN: [id number] DOB: [birth year] Today's Date: 08/19/2023   History of Present Illness 88 y.o. female adm 08/15/23 with weakness in Afib with RVR and UTI. PMHx: CM, HFrEF, AVR, Afib, Rt THA, CKD, HTN.     PT Comments  The pt was agreeable to session despite reports of fatigue this morning due to poor oral intake. The pt required increased physical assistance this session to complete bed mobility and initial sit-stand transfer. Pt with cognitive deficits involving safety awareness, processing, sequencing, and initiation which required repeated cues for positioning with mobility, use of DME, and hand positions. The pt also has posterior LOB needing modA to recover with initial stand, but was able to complete with minA only on subsequent attempts. Pt will continue to benefit from skilled PT acutely to progress functional endurance, stability, and strength for transfers. If family unable to provide consistent supervision for cognition and physical assist for transfers and mobility pt will need continued inpatient therapies <3hours/day.   BP: 103/66 (78) in sitting, 97/72 (82) standing post-ambulation HR: 112bpm SpO2: 91-93% on RA at rest and with gait, low of 88% after gait and placed on 1L O2   If plan is discharge home, recommend the following: A little help with walking and/or transfers;A little help with bathing/dressing/bathroom;Assistance with cooking/housework;Assist for transportation;Direct supervision/assist for medications management;Direct supervision/assist for financial management;Help with stairs or ramp for entrance;Supervision due to cognitive status   Can travel by private vehicle        Equipment Recommendations  BSC/3in1    Recommendations for Other Services       Precautions / Restrictions Precautions Precautions: Fall;Other (comment) Recall of Precautions/Restrictions: Impaired Precaution/Restrictions  Comments: watch BP and SpO2 Restrictions Weight Bearing Restrictions Per Provider Order: No     Mobility  Bed Mobility Overal bed mobility: Needs Assistance Bed Mobility: Supine to Sit     Supine to sit: Mod assist     General bed mobility comments: pt pulling on therapist insteady of rail as cued to elevate trunk to sititng. poor sitting tolerance, leaning laterally to L to rest while sitting EOB    Transfers Overall transfer level: Needs assistance Equipment used: Rolling walker (2 wheels) Transfers: Sit to/from Stand Sit to Stand: Min assist, Mod assist           General transfer comment: modA initially due to poor functional use of UE and posterior lean, minA on subsequent attempts after repeated cues for hand positioning    Ambulation/Gait Ambulation/Gait assistance: Min assist Gait Distance (Feet): 70 Feet Assistive device: Rolling walker (2 wheels) Gait Pattern/deviations: Step-through pattern, Decreased stride length, Trunk flexed Gait velocity: decreased Gait velocity interpretation: <1.31 ft/sec, indicative of household ambulator   General Gait Details: cues for posture, pt running into multiple objects with RW but able to correct without assist when given increased time. pt reports fatigue, increased SOB with SpO2 88-93% after ambulation. SpO2 had bene 91-93% on RA during gait.     Balance Overall balance assessment: Needs assistance Sitting-balance support: Bilateral upper extremity supported, Feet supported Sitting balance-Leahy Scale: Poor     Standing balance support: Bilateral upper extremity supported, Reliant on assistive device for balance, During functional activity Standing balance-Leahy Scale: Poor Standing balance comment: dependent on BUE support in standing, posterior lean with initial stand                            Communication Communication  Communication: Impaired Factors Affecting Communication: Hearing impaired   Cognition Arousal: Alert Behavior During Therapy: WFL for tasks assessed/performed   PT - Cognitive impairments: Memory, Awareness, Attention, Initiation, Sequencing, Problem solving, Safety/Judgement                       PT - Cognition Comments: pt with decreased awareness of deficits, safety, abilities. needing repeated cues for technique, energy conservation, and safety. Following commands: Intact      Cueing Cueing Techniques: Verbal cues  Exercises      General Comments General comments (skin integrity, edema, etc.): SpO2 91-93% on RA during gait, to 88% after ambulation and placed on 1L      Pertinent Vitals/Pain Pain Assessment Pain Assessment: No/denies pain    Home Living Family/patient expects to be discharged to:: Private residence Living Arrangements: Alone Available Help at Discharge: Family;Available PRN/intermittently Type of Home: Apartment Home Access: Level entry       Home Layout: One level Home Equipment: Rollator (4 wheels);Shower seat;Wheelchair - manual;Grab bars - tub/shower Additional Comments: senior living apt and has 2 adult daughters that do not work that can help    Prior Function            PT Goals (current goals can now be found in the care plan section) Acute Rehab PT Goals Patient Stated Goal: return home with daughter PT Goal Formulation: With patient Time For Goal Achievement: 09/01/23 Potential to Achieve Goals: Good Progress towards PT goals: Progressing toward goals    Frequency    Min 2X/week       AM-PAC PT 6 Clicks Mobility   Outcome Measure  Help needed turning from your back to your side while in a flat bed without using bedrails?: A Little Help needed moving from lying on your back to sitting on the side of a flat bed without using bedrails?: A Little Help needed moving to and from a bed to a chair (including a wheelchair)?: A Little Help needed standing up from a chair using your arms (e.g.,  wheelchair or bedside chair)?: A Little Help needed to walk in hospital room?: A Lot Help needed climbing 3-5 steps with a railing? : A Lot 6 Click Score: 16    End of Session Equipment Utilized During Treatment: Gait belt Activity Tolerance: Patient tolerated treatment well Patient left: in chair (with OT at sink) Nurse Communication: Mobility status PT Visit Diagnosis: Other abnormalities of gait and mobility (R26.89);Difficulty in walking, not elsewhere classified (R26.2);Muscle weakness (generalized) (M62.81)     Time: 9175-9146 PT Time Calculation (min) (ACUTE ONLY): 29 min  Charges:    $Gait Training: 8-22 mins $Therapeutic Exercise: 8-22 mins PT General Charges $$ ACUTE PT VISIT: 1 Visit                     Izetta Call, PT, DPT   Acute Rehabilitation Department Office [phone] Secure Chat Communication Preferred    Izetta JULIANNA Call 08/19/2023, 9:51 AM

## 2023-08-19 NOTE — TOC Progression Note (Addendum)
 Transition of Care [facility]) - Progression Note    Patient Details  Name: [name] MRN: [id number] Date of Birth: [birth year]  Transition of Care [facility]) CM/SW Contact  Waddell Barnie Rama, RN Phone Number: 08/19/2023, 3:28 PM  Clinical Narrative:    NCM offered choice for [facility], patient does not have a preference, NCM made referral to [facility] with [facility], she is able to take referral for HHPT, HHOT.  Soc will begin 24 to 48 hrs post dc.  Patient will also need follow up on Friday for  [facility]  clinic  [phone]). Patient gives this NCM permission to call her daughter.  NCM contacted [name] and informed her of this information.  Also let her know it is important for her not to miss the PT/INR apt on Friday because that was the only available one they had at 9:15 am.   Expected Discharge Plan: Home/Self Care Barriers to Discharge: Continued Medical Work up               Expected Discharge Plan and Services       Living arrangements for the past 2 months: Apartment                                       Social Drivers of Health (SDOH) Interventions SDOH Screenings   Food Insecurity: Patient Declined (08/15/2023)  Housing: Low Risk  (08/15/2023)  Transportation Needs: Patient Declined (08/15/2023)  Utilities: Patient Declined (08/15/2023)  Alcohol Screen: Low Risk  (06/30/2022)  Depression (PHQ2-9): Low Risk  (04/01/2023)  Financial Resource Strain: Low Risk  (06/30/2022)  Physical Activity: Inactive (01/06/2023)  Social Connections: Unknown (08/15/2023)  Stress: No Stress Concern Present (06/30/2022)  Tobacco Use: Medium Risk (08/15/2023)  Health Literacy: Adequate Health Literacy (01/06/2023)    Readmission Risk Interventions     No data to display

## 2023-08-19 NOTE — Progress Notes (Signed)
 Daily Progress Note Intern Pager: [phone]  [name] Medical record number: [id number] Date of birth: Mar 30, 1935 Age: 88 y.o. Gender: female  Primary Care Provider: [name] Camie CROME, MD Consultants: Cardiology Code Status: Full  Pt Overview and Major Events to Date:  7/26-admitted 7/29-transition from IV to p.o. diltiazem   Medical Decision Making: 88 year old female admitted for A-fib with RVR with concomitant UTI.  Warfarin being held in the setting of INR 9.4.  Pertinent medical history includes A-fib, CKD, CHF, mitral valve replacement.  Patient's vital signs have been stable on p.o. diltiazem , clinical condition improving.  Tentative discharge 7/31. Assessment & Plan Atrial fibrillation with RVR (HCC) Pt remains in Afib w/ HR now improved to 90s. Cards recommending metoprolol  tartrate 100mg  2x daily and dilt 24 hr capsule 180mg  daily. Echo showed severe dilation of b/l atria, LVEF 45-50% w/ diffuse, severe hypokinesis of the L ventricular wall. Mg> 2 K > 4.  - Appreciate ongoing Cards recs - Change to 180mg  Diltiazem  24hr capsule daily.   - Change to metoprolol  tartrate 100mg  2x daily.  - Discontinue IV fluids UTI (urinary tract infection) UA and sx consistent with UTI. Urine cx contaminated, defer repeat culture.  - Finish CTX 1g daily, day 5 - Bladder scan q shift given hx of neurogenic bladder w/ urinary retention, voiding well Hypertension BP stable overnight.  - Cont metoprolol  and diltiazem  per above - Cont holding home Imdur , Hydral, Lasix  iso low-normotensive BP - restart as indicated  History of mitral valve replacement - St Jude mechanical valve Abnormal INR INR elevated but downtrending at 9.4 this morning.  -Per cardiology, hold warfarin and monitor for signs of bleeding -AM CBC  -Not administering reversal agent given mechanical valve  -AM INR  Chronic health problem CKD3 - Cr plateauing 3.12, monitor outpatient CHF - Lasix  40mg  daily    FEN/GI: Regular PPx: Holding warfarin Dispo:Home tomorrow.   Subjective:  No acute events overnight.  Patient doing significantly better this morning, reports walking up and down the hallway with PT, then received her bath.  She feels her breathing is doing well even with this increased activity.  Discussed her case with family, they expressed reassurance and thanks for the care team.  Objective: Temp:  [97.4 F (36.3 C)-98.7 F (37.1 C)] 97.7 F (36.5 C) (07/30 0400) Pulse Rate:  [72-133] 93 (07/30 0400) Resp:  [17-32] 18 (07/30 0400) BP: (98-141)/(56-97) 132/84 (07/30 0400) SpO2:  [91 %-96 %] 96 % (07/30 0400) Weight:  [68.9 kg] 68.9 kg (07/30 0400) Physical Exam: General: Awake, alert, NAD. Communicates clearly.  Hard of hearing Cardio: Irregular rhythm with 2 out of 6 systolic murmur appreciated. 2+ radial and dorsalis pedis pulses b/l w/ good capillary refill.  Resp: CTA bilaterally. No wheezes, rales, or rhonchi. Normal work of breathing on room air  Laboratory: Most recent CBC Lab Results  Component Value Date   WBC 12.1 (H) 08/18/2023   HGB 9.8 (L) 08/18/2023   HCT 32.5 (L) 08/18/2023   MCV 87.4 08/18/2023   PLT 266 08/18/2023   Most recent BMP    Latest Ref Rng & Units 08/19/2023    2:30 AM  BMP  Glucose 70 - 99 mg/dL 886   BUN 8 - 23 mg/dL 56   Creatinine 9.55 - 1.00 mg/dL 6.87   Sodium 864 - 854 mmol/L 137   Potassium 3.5 - 5.1 mmol/L 4.5   Chloride 98 - 111 mmol/L 111   CO2 22 - 32  mmol/L 16   Calcium 8.9 - 10.3 mg/dL 8.7    Cr 6.87 Mg 2.1 INR 9.4  Imaging/Diagnostic Tests: None  Manon Jester, DO 08/19/2023, 7:21 AM  PGY-1, [facility] FPTS Intern pager: [phone], text pages welcome Secure chat group [facility] Teaching Service

## 2023-08-20 ENCOUNTER — Other Ambulatory Visit (HOSPITAL_COMMUNITY): Payer: Self-pay

## 2023-08-20 DIAGNOSIS — I4891 Unspecified atrial fibrillation: Secondary | ICD-10-CM | POA: Diagnosis not present

## 2023-08-20 LAB — CULTURE, BLOOD (ROUTINE X 2)
Culture: NO GROWTH
Culture: NO GROWTH
Special Requests: ADEQUATE
Special Requests: ADEQUATE

## 2023-08-20 LAB — PROTIME-INR
INR: 8.4 (ref 0.8–1.2)
Prothrombin Time: 72.6 s — ABNORMAL HIGH (ref 11.4–15.2)

## 2023-08-20 MED ORDER — TAMSULOSIN HCL 0.4 MG PO CAPS
0.8000 mg | ORAL_CAPSULE | Freq: Every day | ORAL | 0 refills | Status: DC
  Filled 2023-08-20: qty 60, 30d supply, fill #0

## 2023-08-20 MED ORDER — FUROSEMIDE 40 MG PO TABS
40.0000 mg | ORAL_TABLET | Freq: Every day | ORAL | 0 refills | Status: DC
  Filled 2023-08-20: qty 30, 30d supply, fill #0

## 2023-08-20 MED ORDER — DILTIAZEM HCL ER COATED BEADS 180 MG PO CP24
180.0000 mg | ORAL_CAPSULE | Freq: Every day | ORAL | 0 refills | Status: DC
  Filled 2023-08-20: qty 30, 30d supply, fill #0

## 2023-08-20 MED ORDER — METOPROLOL TARTRATE 100 MG PO TABS
100.0000 mg | ORAL_TABLET | Freq: Two times a day (BID) | ORAL | 0 refills | Status: DC
  Filled 2023-08-20: qty 60, 30d supply, fill #0

## 2023-08-20 MED ORDER — ACETAMINOPHEN 325 MG PO TABS: 650.0000 mg | ORAL_TABLET | Freq: Four times a day (QID) | ORAL | Status: AC | PRN

## 2023-08-20 NOTE — Progress Notes (Signed)
 Rounding Note   [name] Date of Encounter: 08/20/2023  [facility] Cardiologist: Danelle Birmingham, MD   Subjective  Feels better today. Denies SOB.   Scheduled Meds:  diltiazem   180 mg Oral Daily   dorzolamide -timolol   1 drop Both Eyes BID   furosemide   40 mg Oral Daily   latanoprost   1 drop Both Eyes QHS   metoprolol  tartrate  100 mg Oral BID   pantoprazole   40 mg Oral Daily   tamsulosin   0.8 mg Oral Daily   Warfarin - Pharmacist Dosing Inpatient   Does not apply q1600   Continuous Infusions:  cefTRIAXone  (ROCEPHIN )  IV 1 g (08/19/23 1430)   PRN Meds: acetaminophen    Vital Signs  Vitals:   08/19/23 1901 08/20/23 0000 08/20/23 0057 08/20/23 0500  BP: 133/66 (!) 140/85  131/83  Pulse: 91 89 89 84  Resp: (!) 22 (!) 23 20 (!) 24  Temp: 97.6 F (36.4 C) 97.6 F (36.4 C)  97.6 F (36.4 C)  TempSrc: Oral Oral  Oral  SpO2: 100% 99%  98%  Weight:    68.9 kg    Intake/Output Summary (Last 24 hours) at 08/20/2023 0754 Last data filed at 08/20/2023 0530 Gross per 24 hour  Intake 218 ml  Output 826 ml  Net -608 ml      08/20/2023    5:00 AM 08/19/2023    4:00 AM 08/18/2023   12:00 AM  Last 3 Weights  Weight (lbs) 151 lb 14.4 oz 151 lb 14.4 oz 149 lb 0.5 oz  Weight (kg) 68.9 kg 68.9 kg 67.6 kg      Telemetry Atrial fibrillation with controlled rate.  Personally Reviewed  ECG  Atrial fibrillation with RBBB - Personally Reviewed  Physical Exam  GEN: No acute distress.   Neck: No JVD Cardiac: irregular, gr 2/6 systolic murmur RUSB to apex, no rubs, or gallops.  Respiratory: Clear to auscultation bilaterally. GI: Soft, nontender, non-distended  MS: No edema; No deformity. Neuro:  Nonfocal  Psych: Normal affect   Labs High Sensitivity Troponin:  No results for input(s): TROPONINIHS in the last 720 hours.   Chemistry Recent Labs  Lab 08/15/23 1130 08/16/23 0325 08/17/23 0232 08/18/23 0215 08/19/23 0230  NA 132*   < > 136  135 137  K 3.4*   < > 4.6 4.2 4.5  CL 101   < > 108 107 111  CO2 19*   < > 20* 20* 16*  GLUCOSE 110*   < > 119* 147* 113*  BUN 78*   < > 62* 58* 56*  CREATININE 4.16*   < > 3.42* 3.07* 3.12*  CALCIUM 8.3*   < > 8.6* 8.6* 8.7*  MG 2.0   < > 2.3 2.2 2.1  PROT 7.3  --   --   --   --   ALBUMIN 2.4*  --   --   --   --   AST 21  --   --   --   --   ALT 11  --   --   --   --   ALKPHOS 46  --   --   --   --   BILITOT 0.9  --   --   --   --   GFRNONAA 10*   < > 13* 14* 14*  ANIONGAP 12   < > 8 8 10    < > = values in this interval not displayed.  Lipids No results for input(s): CHOL, TRIG, HDL, LABVLDL, LDLCALC, CHOLHDL in the last 168 hours.  Hematology Recent Labs  Lab 08/16/23 1505 08/17/23 0232 08/18/23 0215  WBC 13.4* 13.0* 12.1*  RBC 3.72* 3.78* 3.72*  HGB 9.7* 9.9* 9.8*  HCT 31.6* 32.6* 32.5*  MCV 84.9 86.2 87.4  MCH 26.1 26.2 26.3  MCHC 30.7 30.4 30.2  RDW 14.8 15.1 15.4  PLT 255 272 266   Thyroid  No results for input(s): TSH, FREET4 in the last 168 hours.  BNPNo results for input(s): BNP, PROBNP in the last 168 hours.  DDimer No results for input(s): DDIMER in the last 168 hours.   Radiology  No results found.   Cardiac Studies Echo 08/16/2023  1. Left ventricular ejection fraction, by estimation, is 45 to 50%. The  left ventricle has mildly decreased function. The left ventricle  demonstrates regional wall motion abnormalities (see scoring  diagram/findings for description). There is moderate  left ventricular hypertrophy. Left ventricular diastolic function could  not be evaluated. There is severe hypokinesis of the left ventricular,  basal-mid lateral wall, inferolateral wall and inferior wall.   2. Right ventricular systolic function is mildly reduced. The right  ventricular size is normal. There is severely elevated pulmonary artery  systolic pressure. The estimated right ventricular systolic pressure is  60.4 mmHg.   3. Left atrial  size was severely dilated.   4. Right atrial size was severely dilated.   5. The mitral valve has been repaired/replaced. Trivial mitral valve  regurgitation. The mean mitral valve gradient is 5.0 mmHg. There is a St.  Jude present in the mitral position. Procedure Date: 21. Echo findings  are consistent with normal structure  and function of the mitral valve prosthesis.   6. The aortic valve is calcified. There is moderate calcification of the  aortic valve. Aortic valve regurgitation is trivial. Moderate to severe  aortic valve stenosis. Aortic valve area, by VTI measures 0.68 cm. Aortic  valve mean gradient measures 22.5   mmHg. Aortic valve Vmax measures 3.13 m/s. Peak gradient 39.1 mmHg, DI  0.30.   7. The inferior vena cava is normal in size with <50% respiratory  variability, suggesting right atrial pressure of 8 mmHg.   Comparison(s): Changes from prior study are noted. 04/05/2022: LVEF 55%,  mild to moderate AS - DI 30, MVR MG 2 mmHg.   Conclusion(s)/Recommendation(s): Regional wall motion abnormality noted on  prior echo - noted fixed inferior/inferolateral defect on prior stress  testing, however, coronary anatomy was normal by prior cath.   Patient Profile   88 y.o. female with PMH of MS s/p MVR 1987, permanent atrial fibrillation, NICM with improved EF (normal cors on cath 04/02/2021), chronic diastolic CHF, CKD stage III, HTN, chronic anemia and NSVT who presented with UTI and dehydration and was in afib with RVR. Cr peaked at 4.16. INR was 10.   Assessment & Plan   Permanent atrial fibrillation with RVR  - on metoprolol  100 mg bid  - increased heart rate likely driven by underlying illness.  - on Cardizem  CD 180 mg daily.  - rate control is adequate now. Given permanent Afib I don't really see a role for amiodarone . Diltizem is not ideal with mildly reduce EF but will continue for now. May be able to wean as outpatient if rate improves.   Moderate to severe aortic  stenosis  - was previously mild to moderate AS on echo in March 2024. Now moderate to severe AS.  -  Will need to consider valve team evaluation depending on medical status as outpatient.   H/o mitral valve replacement: stable on echo.  NICM: EF 45%.   Hypertension  6.   Anticoagulation. Still supratherapeutic with INR 8.4. followed by pharmacy.   From a heart standpoint patient is stable for DC. We can arrange follow up in office.     For questions or updates, please contact [facility] Please consult [email or web address] for contact info under     Signed, Sreshta Cressler [place], MD  08/20/2023, 7:54 AM

## 2023-08-20 NOTE — Discharge Summary (Addendum)
 Family Medicine Teaching [facility] Discharge Summary  [name] Medical record number: [id number] Date of birth: 28-Apr-1935 Age: 88 y.o. Gender: female Date of Admission: 08/15/2023  Date of Discharge: 08/20/2023 Admitting Physician: Camie LITTIE Mulch, MD  Primary Care Provider: Mulch Camie LITTIE, MD Consultants: Cardiology  Indication for Hospitalization: A-fib with RVR  Discharge Diagnoses/Problem List:  Principal Problem for Admission: A-fib with RVR Other Problems addressed during stay:  Active Problems:   History of mitral valve replacement - St Jude mechanical valve   CKD (chronic kidney disease), stage III (HCC)   Chronic atrial fibrillation (HCC)   Chronic combined systolic and diastolic congestive heart failure (HCC)   Atrial fibrillation with RVR (HCC)   Dysuria   Leukocytosis   Chronic health problem   Abnormal INR Urinary tract infection   Brief Hospital Course:  [name] is a 88 y.o.female with a history of A-fib, CKD, CHF, mitral valve replacement who was admitted to the [facility] Medicine Teaching Service at [facility] for A-fib with RVR with a concomitant UTI. Her hospital course is detailed below:  A-fib with RVR Presented with symptoms of palpitations.  Found to be in A-fib with RVR in the emergency department. Cardiology was consulted. Patient was put on a diltiazem  drip.  Her metoprolol  was titrated to 100mg  BID. Echo showed severe dilation of bilateral atria, LVEF 45-50% with diffuse severe hypokinesis of the left ventricular wall, severe aortic stenosis. On 7/29, she was transitioned from IV to PO diltiazem .  She was placed on a final regimen of diltiazem  24-hour capsule 180 mg daily, Lopressor  100 mg 2 times daily.  Per cardiology diltiazem  not ideal medicine given reduced ejection fraction; however, they did not see role for amiodarone .  Plan to titrate to rate off of diltiazem  outpatient.  Patient's heart rate was controlled in the high 80s at  discharge with stable blood pressures.   UTI In the setting of suprapubic catheter secondary to overactive bladder.  UTI thought to be trigger for patient's A-fib with RVR. Patient treated with IV ceftriaxone  to completion from (7/27 -7/31).   History of mitral valve replacement - St Jude mechanical valve Abnormal INR Patient's warfarin was found to be 10.5 on 7/27. Warfarin was held and no reversal agents given as there were no signs of bleeding and her hemoglobin remained stable. Her INR down trended to 8.4 at time of discharge.  Warfarin clinic follow-up appointment scheduled for 8/1.  Acute on chronic kidney injury Creatinine elevated to 4.1 on admission from a baseline of around 2-2.5.  Multiple possible etiologies including hypoperfusion secondary to RVR versus cardiorenal syndrome versus UTI.  Ultimately did improve with gentle fluids and home diuretic regimen.  Creatinine at discharge was 3.12.  This is suspected to be new baseline.  Dyspnea. Patient demonstrated dyspnea on exertion and desaturation with exercise while inpatient. Likely secondary to chronic heart failure and severe aortic stenosis. Ambulatory pulse ox showed ambulating saturation of 81%. Patient was discharged on home oxygen supply of 2L [place].  Other chronic conditions were medically managed with home medications and formulary alternatives as necessary (CKD 3, CHF)  PCP Follow-up Recommendations: INR recheck 8/1 Follow-up with cardiology Follow-up BMP    Results/Tests Pending at Time of Discharge:  Unresulted Labs (From admission, onward)     Start     Ordered   08/16/23 0500  Protime-INR  Daily,   R      08/15/23 1433   08/15/23 1316  Resp panel  by RT-PCR (RSV, Flu A&B, Covid) Anterior Nasal Swab  (Septic presentation on arrival (screening labs, nursing and treatment orders for obvious sepsis))  Once,   URGENT        08/15/23 1316           Disposition: Home with home health PT-OT and Ambulatory  O2  Discharge Condition: Stable, rate controlled. Tachypneic at baseline, home O2.  Discharge Exam:  Vitals:   08/20/23 0830 08/20/23 1129  BP: 106/80 121/76  Pulse: 100 89  Resp: (!) 21 (!) 23  Temp: 97.7 F (36.5 C) 97.7 F (36.5 C)  SpO2: 99% 98%   General: Awake, alert, NAD. Communicates clearly. Cardio: Irregular rate w/ 2/6 systolic murmur. 2+ radial and dorsalis pedis pulses b/l w/ good capillary refill.  Resp: CTA bilaterally. No wheezes, rales, or rhonchi. Borderline tachypneic w/ normal work of breathing on [place].  Abdomen: soft, non-tender, non-distended. Normoactive BS auscultated. No guarding, rigidity, or rebound.   Significant Procedures: None  Significant Labs and Imaging:  CBC    Component Value Date/Time   WBC 12.1 (H) 08/18/2023 0215   RBC 3.72 (L) 08/18/2023 0215   HGB 9.8 (L) 08/18/2023 0215   HGB 10.5 (L) 06/26/2023 1007   HCT 32.5 (L) 08/18/2023 0215   HCT 33.2 (L) 06/26/2023 1007   HCT 26 (A) 05/30/2015 0000   PLT 266 08/18/2023 0215   PLT 203 06/26/2023 1007   MCV 87.4 08/18/2023 0215   MCV 88 06/26/2023 1007   MCV 83.5 05/30/2015 0000   MCH 26.3 08/18/2023 0215   MCHC 30.2 08/18/2023 0215   RDW 15.4 08/18/2023 0215   RDW 12.9 06/26/2023 1007   RDW 15.2 (A) 05/30/2015 0000   LYMPHSABS 1.4 08/15/2023 1130   MONOABS 0.9 08/15/2023 1130   EOSABS 0.0 08/15/2023 1130   BASOSABS 0.0 08/15/2023 1130   Recent Labs  Lab 08/19/23 0230  NA 137  K 4.5  CL 111  CO2 16*  GLUCOSE 113*  BUN 56*  CREATININE 3.12*  CALCIUM 8.7*  MG 2.1   CXR Stable marked cardiomegaly. No active lung disease.   Echo  1. Left ventricular ejection fraction, by estimation, is 45 to 50%. The  left ventricle has mildly decreased function. The left ventricle  demonstrates regional wall motion abnormalities (see scoring  diagram/findings for description). There is moderate  left ventricular hypertrophy. Left ventricular diastolic function could  not be evaluated.  There is severe hypokinesis of the left ventricular,  basal-mid lateral wall, inferolateral wall and inferior wall.   2. Right ventricular systolic function is mildly reduced. The right  ventricular size is normal. There is severely elevated pulmonary artery  systolic pressure. The estimated right ventricular systolic pressure is  60.4 mmHg.   3. Left atrial size was severely dilated.   4. Right atrial size was severely dilated.   5. The mitral valve has been repaired/replaced. Trivial mitral valve  regurgitation. The mean mitral valve gradient is 5.0 mmHg. There is a St.  Jude present in the mitral position. Procedure Date: 76. Echo findings  are consistent with normal structure  and function of the mitral valve prosthesis.   6. The aortic valve is calcified. There is moderate calcification of the  aortic valve. Aortic valve regurgitation is trivial. Moderate to severe  aortic valve stenosis. Aortic valve area, by VTI measures 0.68 cm. Aortic  valve mean gradient measures 22.5   mmHg. Aortic valve Vmax measures 3.13 m/s. Peak gradient 39.1 mmHg, DI  0.30.  7. The inferior vena cava is normal in size with <50% respiratory  variability, suggesting right atrial pressure of 8 mmHg.    Discharge Medications:  Allergies as of 08/20/2023       Reactions   Iodinated Contrast Media Rash   Pt stated in the past had broken out in a rash on back and itching.    Esomeprazole Magnesium     REACTION: stomach upset, nausea        Medication List     PAUSE taking these medications    hydrALAZINE  10 MG tablet Wait to take this until your doctor or other care provider tells you to start again. Commonly known as: APRESOLINE  TAKE 1 TABLET(10 MG) BY MOUTH THREE TIMES DAILY   warfarin 5 MG tablet Wait to take this until your doctor or other care provider tells you to start again. Commonly known as: COUMADIN  Take as directed. If you are unsure how to take this medication, talk to your  nurse or doctor. Original instructions: TAKE 1 TO 2 TABLETS BY MOUTH DAILY AS DIRECTED BY COUMADIN  CLINIC What changed: See the new instructions.       STOP taking these medications    amoxicillin  500 MG capsule Commonly known as: AMOXIL    isosorbide  mononitrate 30 MG 24 hr tablet Commonly known as: IMDUR    metoprolol  succinate 100 MG 24 hr tablet Commonly known as: TOPROL -XL       TAKE these medications    acetaminophen  325 MG tablet Commonly known as: TYLENOL  Take 2 tablets (650 mg total) by mouth every 6 (six) hours as needed for mild pain (pain score 1-3) or headache.   diltiazem  180 MG 24 hr capsule Commonly known as: CARDIZEM  CD Take 1 capsule (180 mg total) by mouth daily.   dorzolamidel-timolol  22.3-6.8 MG/ML Soln ophthalmic solution Commonly known as: COSOPT  Place 1 drop into both eyes in the morning and at bedtime.   FIBER PO Take 1 tablet by mouth daily as needed (Only for diarrhea).   furosemide  40 MG tablet Commonly known as: LASIX  Take 1 tablet (40 mg total) by mouth daily. What changed: See the new instructions.   Lumigan 0.01 % Soln Generic drug: bimatoprost Place 1 drop into both eyes at bedtime.   metoprolol  tartrate 100 MG tablet Commonly known as: LOPRESSOR  Take 1 tablet (100 mg total) by mouth 2 (two) times daily.   pantoprazole  40 MG tablet Commonly known as: PROTONIX  TAKE 1 TABLET(40 MG) BY MOUTH DAILY   polyethylene glycol 17 g packet Commonly known as: MIRALAX  / GLYCOLAX  Take 17 g by mouth daily as needed for mild constipation.   tamsulosin  0.4 MG Caps capsule Commonly known as: FLOMAX  Take 2 capsules (0.8 mg total) by mouth daily. Start taking on: August 21, 2023   Vitamin D  50 MCG (2000 UT) tablet Take 1 tablet (2,000 Units total) by mouth daily.        Discharge Instructions: Please refer to Patient Instructions section of EMR for full details.  Patient was counseled important signs and symptoms that should prompt  return to medical care, changes in medications, dietary instructions, activity restrictions, and follow up appointments.   Follow-Up Appointments:  Follow-up Information     [facility] Follow up.   Specialty: Home Health Services Why: Agency will call you to set up apt times Contact information: [address] [place] [address] [phone]         [facility].   Why: for  coumadin  PT/INR check Friday at 9:15 am Contact information: [address] [place]                Manon Jester, DO 08/20/2023, 12:13 PM PGY-1, [facility] Family Medicine  I have verified that the resident's  findings are accurately documented in the resident's note. I have made edits and changes where appropriate, and agree with plan.  Ozell Provencal, MD, PGY-3 [facility] Family Medicine 1:38 PM 08/20/2023

## 2023-08-20 NOTE — Assessment & Plan Note (Signed)
 CKD3 - Cr plateauing 3.12, monitor outpatient CHF - Lasix  40mg  daily

## 2023-08-20 NOTE — TOC Transition Note (Signed)
 Transition of Care [facility]) - Discharge Note   Patient Details  Name: [name] MRN: [id number] Date of Birth: 10/01/1935  Transition of Care [facility]) CM/SW Contact:  Waddell Barnie Rama, RN Phone Number: 08/20/2023, 1:32 PM   Clinical Narrative:    For dc home today, NCM notified Kelly with [facility].  [employer] to supply the home oxygen.  Daughter [name] to transport her home.      Barriers to Discharge: Continued Medical Work up   Patient Goals and CMS Choice            Discharge Placement                       Discharge Plan and Services Additional resources added to the After Visit Summary for                                       Social Drivers of Health (SDOH) Interventions SDOH Screenings   Food Insecurity: Patient Declined (08/15/2023)  Housing: Low Risk  (08/15/2023)  Transportation Needs: Patient Declined (08/15/2023)  Utilities: Patient Declined (08/15/2023)  Alcohol Screen: Low Risk  (06/30/2022)  Depression (PHQ2-9): Low Risk  (04/01/2023)  Financial Resource Strain: Low Risk  (06/30/2022)  Physical Activity: Inactive (01/06/2023)  Social Connections: Unknown (08/15/2023)  Stress: No Stress Concern Present (06/30/2022)  Tobacco Use: Medium Risk (08/15/2023)  Health Literacy: Adequate Health Literacy (01/06/2023)     Readmission Risk Interventions     No data to display

## 2023-08-20 NOTE — Progress Notes (Signed)
 Occupational Therapy Treatment Patient Details Name: [name] MRN: [id number] DOB: [birth year] Today's Date: 08/20/2023   History of present illness 88 y.o. female adm 08/15/23 with weakness in Afib with RVR and UTI. PMHx: CM, HFrEF, AVR, Afib, Rt THA, CKD, HTN   OT comments  This 88 yo seen today to see how cognition was as well as mobility and ADLs are with pt. She did better than she did yesterday both from mobility, ADLs, and cognition standpoint; however O2 sats did significantly drop on RA--RN and MD made aware. We will continue to follow.      If plan is discharge home, recommend the following:  A little help with walking and/or transfers;A little help with bathing/dressing/bathroom;Help with stairs or ramp for entrance;Assist for transportation;Assistance with cooking/housework   Equipment Recommendations  BSC/3in1;Wheelchair (measurements OT)       Precautions / Restrictions Precautions Precautions: Fall;Other (comment) Recall of Precautions/Restrictions: Impaired Precaution/Restrictions Comments: watch BP and SpO2 (BP good this session, SPO2 on RA not good this session--dropped to 81 with ambulation Restrictions Weight Bearing Restrictions Per Provider Order: No       Mobility Bed Mobility Overal bed mobility: Needs Assistance Bed Mobility: Supine to Sit     Supine to sit: Contact guard     General bed mobility comments: HOB flat and no rail    Transfers Overall transfer level: Needs assistance Equipment used: Rolling walker (2 wheels) Transfers: Sit to/from Stand Sit to Stand: Contact guard assist           General transfer comment: no LOB with sit<>stand, very aware of reaching back to when stand>sit     Balance Overall balance assessment: Needs assistance Sitting-balance support: No upper extremity supported, Feet supported Sitting balance-Leahy Scale: Good     Standing balance support: No upper extremity supported, During functional  activity Standing balance-Leahy Scale: Fair Standing balance comment: standing at sink to brush teeth                           ADL either performed or assessed with clinical judgement   ADL Overall ADL's : Needs assistance/impaired     Grooming: Oral care;Contact guard assist;Standing Grooming Details (indicate cue type and reason): no cues needed                 Toilet Transfer: Contact guard assist;Ambulation;Rolling walker (2 wheels) Toilet Transfer Details (indicate cue type and reason): simulated sit<>stand from chair at sink                Extremity/Trunk Assessment Upper Extremity Assessment Upper Extremity Assessment: Overall WFL for tasks assessed            Vision Patient Visual Report: No change from baseline               Cognition Arousal: Alert Behavior During Therapy: WFL for tasks assessed/performed Cognition: No apparent impairments (A&O x4, grooming session at sink without issues)                                                      Pertinent Vitals/ Pain       Pain Assessment Pain Assessment: No/denies pain         Frequency  Min 2X/week        Progress Toward  Goals  OT Goals(current goals can now be found in the care plan section)  Progress towards OT goals: Progressing toward goals  Acute Rehab OT Goals Patient Stated Goal: to lay back down to conserve her energy for going home later OT Goal Formulation: With patient Time For Goal Achievement: 09/01/23 Potential to Achieve Goals: Good         AM-PAC OT 6 Clicks Daily Activity     Outcome Measure   Help from another person eating meals?: None Help from another person taking care of personal grooming?: A Little Help from another person toileting, which includes using toliet, bedpan, or urinal?: A Little Help from another person bathing (including washing, rinsing, drying)?: A Little Help from another person to put on and taking  off regular upper body clothing?: A Little Help from another person to put on and taking off regular lower body clothing?: A Little 6 Click Score: 19    End of Session Equipment Utilized During Treatment: Oxygen;Gait belt;Rolling walker (2 wheels)  OT Visit Diagnosis: Unsteadiness on feet (R26.81);Muscle weakness (generalized) (M62.81)   Activity Tolerance Patient tolerated treatment well   Patient Left in bed;with call bell/phone within reach;with bed alarm set   Nurse Communication  (pt's O2 dropped with ambulation on RA to 81%)        Time: 8874-8795 OT Time Calculation (min): 39 min  Charges: OT General Charges $OT Visit: 1 Visit OT Treatments $Self Care/Home Management : 38-52 mins  Donny BECKER OT Acute Rehabilitation Services Office [phone]   [place] Dorothyann Distel 08/20/2023, 1:09 PM

## 2023-08-20 NOTE — Assessment & Plan Note (Signed)
 INR elevated but downtrending at 8.4 this morning.  -Per cardiology, hold warfarin and monitor for signs of bleeding -AM CBC  -Not administering reversal agent given mechanical valve  -AM INR  - Outpatient coumadin  clinic appt arranged on discharge

## 2023-08-20 NOTE — TOC Progression Note (Signed)
 Transition of Care [facility]) - Progression Note    Patient Details  Name: [name] MRN: [id number] Date of Birth: 1935/05/26  Transition of [facility]) CM/SW Contact  Waddell Barnie Rama, RN Phone Number: 08/20/2023, 1:23 PM  Clinical Narrative:    Patient will need home oxygen, NCM spoke with daughter , [name], she has no preference of the agency,  NCM made referral to Jermaine with [employer].  A oxygen tank will be brought to the room and the concentrator will be set up at the home.    Expected Discharge Plan: Home/Self Care Barriers to Discharge: Continued Medical Work up               Expected Discharge Plan and Services       Living arrangements for the past 2 months: Apartment                                       Social Drivers of Health (SDOH) Interventions SDOH Screenings   Food Insecurity: Patient Declined (08/15/2023)  Housing: Low Risk  (08/15/2023)  Transportation Needs: Patient Declined (08/15/2023)  Utilities: Patient Declined (08/15/2023)  Alcohol Screen: Low Risk  (06/30/2022)  Depression (PHQ2-9): Low Risk  (04/01/2023)  Financial Resource Strain: Low Risk  (06/30/2022)  Physical Activity: Inactive (01/06/2023)  Social Connections: Unknown (08/15/2023)  Stress: No Stress Concern Present (06/30/2022)  Tobacco Use: Medium Risk (08/15/2023)  Health Literacy: Adequate Health Literacy (01/06/2023)    Readmission Risk Interventions     No data to display

## 2023-08-20 NOTE — Progress Notes (Signed)
 Dr. Theophilus with [facility] notified of critical lab value, INR 8.4

## 2023-08-20 NOTE — Assessment & Plan Note (Signed)
 BP stable overnight.  - Cont metoprolol  and diltiazem  per above - Cont holding home Imdur , Hydral, Lasix  iso low-normotensive BP - restart as indicated

## 2023-08-20 NOTE — Progress Notes (Signed)
 PHARMACY - ANTICOAGULATION CONSULT NOTE  Pharmacy Consult for warfarin Indication: MVR and chronic afib  Allergies  Allergen Reactions   Iodinated Contrast Media Rash    Pt stated in the past had broken out in a rash on back and itching.    Esomeprazole Magnesium      REACTION: stomach upset, nausea   Patient Measurements: Weight: 68.9 kg (151 lb 14.4 oz)  Vital Signs: Temp: 97.6 F (36.4 C) (07/31 0500) Temp Source: Oral (07/31 0500) BP: 131/83 (07/31 0500) Pulse Rate: 84 (07/31 0500)  Labs: Recent Labs    08/18/23 0215 08/19/23 0230 08/20/23 0329  HGB 9.8*  --   --   HCT 32.5*  --   --   PLT 266  --   --   LABPROT 81.0* 79.2* 72.6*  INR 9.6* 9.4* 8.4*  CREATININE 3.07* 3.12*  --    Estimated Creatinine Clearance: 11.4 mL/min (A) (by C-G formula based on SCr of 3.12 mg/dL (H)).  Medical History: Past Medical History:  Diagnosis Date   Asthma    years ago   Chronic atrial fibrillation (HCC)    Dilated cardiomyopathy (HCC)    history of this, now resolved   Dysphagia    Hx   Fracture of tibial plateau 05/21/2015   GERD (gastroesophageal reflux disease)    GI bleed    Hemorrhoid 04/2014   bleeding   History of blood transfusion    Hypertension    Microcytic anemia    Nonsustained ventricular tachycardia (HCC)    Osteoporosis    Protein in urine    RBBB (right bundle branch block)    Rheumatic mitral valve disease    Assessment: 52 YOF presenting with weakness, Hx of afib and mechanical mitral valve replacement on warfarin PTA with INR on admission supratheraputic at 7.6.   PTA dosing: 5mg  on Tue/thur/Sat, 7.5mg  all other days  07/31 AM update: INR remains supratherapeutic at 8.4; no s/sx of overt bleeding seen. Hgb, Hct and Platelets are stable. Not planning to reverse  unless overt bleeding per cardiology given mMVR.   Goal of Therapy:  INR 2.5-3.5 Monitor platelets by anticoagulation protocol: Yes   Plan:  Hold warfarin today Daily INR, s/s  bleeding, CBC  Maurilio Patten, PharmD PGY1 Pharmacy Resident [facility] 08/20/2023 7:55 AM

## 2023-08-20 NOTE — Progress Notes (Signed)
 SATURATION QUALIFICATIONS: (This note is used to comply with regulatory documentation for home oxygen)  Patient Saturations on Room Air at Rest = 95%  Patient Saturations on Room Air while Ambulating = 81%  Patient Saturations on 1 Liters of oxygen while Ambulating = 95%  Please briefly explain why patient needs home oxygen: With ambulation pt's O2 drops and pt with mild increase in DOE.  Donny BECKER OT Acute Rehabilitation Services Office [phone]

## 2023-08-20 NOTE — Assessment & Plan Note (Signed)
 UA and sx consistent with UTI. Urine cx contaminated, defer repeat culture.  - Finish CTX 1g daily, day 5 - Bladder scan q shift given hx of neurogenic bladder w/ urinary retention, voiding well

## 2023-08-20 NOTE — Progress Notes (Shared)
     Daily Progress Note Intern Pager: [phone]  [name] Medical record number: [id number] Date of birth: Dec 09, 1935 Age: 88 y.o. Gender: female  Primary Care Provider: [name] Camie CROME, MD Consultants: *** Code Status: ***  Pt Overview and Major Events to Date:  ***  Medical Decision Making:  ***. Pertinent PMH/PSH includes ***.  Assessment & Plan Atrial fibrillation with RVR (HCC) Pt remains in Afib w/ HR now improved to 80/low 90s. Cards recommending metoprolol  tartrate 100mg  2x daily and dilt 24 hr capsule 180mg  daily. Echo showed severe dilation of b/l atria, LVEF 45-50% w/ diffuse, severe hypokinesis of the L ventricular wall. Mg> 2 K > 4.  - Appreciate ongoing Cards recs - Continue 180mg  Diltiazem  24hr capsule daily.   - Continue metoprolol  tartrate 100mg  2x daily.  - Discontinue IV fluids History of mitral valve replacement - St Jude mechanical valve Abnormal INR INR elevated but downtrending at 8.4 this morning.  -Per cardiology, hold warfarin and monitor for signs of bleeding -AM CBC  -Not administering reversal agent given mechanical valve  -AM INR  - Outpatient coumadin  clinic appt arranged on discharge Hypertension (Resolved: 08/20/2023) BP stable overnight.  - Cont metoprolol  and diltiazem  per above - Cont holding home Imdur , Hydral, Lasix  iso low-normotensive BP - restart as indicated  UTI (urinary tract infection) (Resolved: 08/20/2023) UA and sx consistent with UTI. Urine cx contaminated, defer repeat culture.  - Finish CTX 1g daily, day 5 - Bladder scan q shift given hx of neurogenic bladder w/ urinary retention, voiding well Chronic health problem CKD3 - Cr plateauing 3.12, monitor outpatient CHF - Lasix  40mg  daily    *** bullet points preferred for A/P, please avoid repeating one liner/medical decision making, delete statement to sign ***    Chronic and Stable Issues: ***  FEN/GI: *** PPx: *** Dispo:{FPTSDISOLIST:27587}  {FPTSDISOTIME:27588}. Barriers include ***.   Subjective:  ***  Objective: Temp:  [97.4 F (36.3 C)-98.8 F (37.1 C)] 97.6 F (36.4 C) (07/31 0500) Pulse Rate:  [84-93] 84 (07/31 0500) Resp:  [17-24] 24 (07/31 0500) BP: (124-140)/(66-85) 131/83 (07/31 0500) SpO2:  [97 %-100 %] 98 % (07/31 0500) Weight:  [68.9 kg] 68.9 kg (07/31 0500) Physical Exam: General: *** Cardiovascular: *** Respiratory: *** Abdomen: *** Extremities: ***  Laboratory: Most recent CBC Lab Results  Component Value Date   WBC 12.1 (H) 08/18/2023   HGB 9.8 (L) 08/18/2023   HCT 32.5 (L) 08/18/2023   MCV 87.4 08/18/2023   PLT 266 08/18/2023   Most recent BMP    Latest Ref Rng & Units 08/19/2023    2:30 AM  BMP  Glucose 70 - 99 mg/dL 886   BUN 8 - 23 mg/dL 56   Creatinine 9.55 - 1.00 mg/dL 6.87   Sodium 864 - 854 mmol/L 137   Potassium 3.5 - 5.1 mmol/L 4.5   Chloride 98 - 111 mmol/L 111   CO2 22 - 32 mmol/L 16   Calcium 8.9 - 10.3 mg/dL 8.7    INR 8.4  Imaging/Diagnostic Tests:  Manon Jester, DO 08/20/2023, 8:06 AM  PGY-1, [facility] Family Medicine FPTS Intern pager: [phone], text pages welcome Secure chat group [facility] Teaching Service

## 2023-08-20 NOTE — Assessment & Plan Note (Signed)
 Pt remains in Afib w/ HR now improved to 80/low 90s. Cards recommending metoprolol  tartrate 100mg  2x daily and dilt 24 hr capsule 180mg  daily. Echo showed severe dilation of b/l atria, LVEF 45-50% w/ diffuse, severe hypokinesis of the L ventricular wall. Mg> 2 K > 4.  - Appreciate ongoing Cards recs - Continue 180mg  Diltiazem  24hr capsule daily.   - Continue metoprolol  tartrate 100mg  2x daily.  - Discontinue IV fluids

## 2023-08-21 ENCOUNTER — Telehealth: Payer: Self-pay

## 2023-08-21 ENCOUNTER — Ambulatory Visit: Attending: Internal Medicine | Admitting: *Deleted

## 2023-08-21 DIAGNOSIS — Z952 Presence of prosthetic heart valve: Secondary | ICD-10-CM | POA: Diagnosis not present

## 2023-08-21 DIAGNOSIS — K219 Gastro-esophageal reflux disease without esophagitis: Secondary | ICD-10-CM | POA: Diagnosis not present

## 2023-08-21 DIAGNOSIS — Z9049 Acquired absence of other specified parts of digestive tract: Secondary | ICD-10-CM | POA: Diagnosis not present

## 2023-08-21 DIAGNOSIS — I4821 Permanent atrial fibrillation: Secondary | ICD-10-CM | POA: Diagnosis not present

## 2023-08-21 DIAGNOSIS — K5730 Diverticulosis of large intestine without perforation or abscess without bleeding: Secondary | ICD-10-CM | POA: Diagnosis not present

## 2023-08-21 DIAGNOSIS — I428 Other cardiomyopathies: Secondary | ICD-10-CM | POA: Diagnosis not present

## 2023-08-21 DIAGNOSIS — I130 Hypertensive heart and chronic kidney disease with heart failure and stage 1 through stage 4 chronic kidney disease, or unspecified chronic kidney disease: Secondary | ICD-10-CM | POA: Diagnosis not present

## 2023-08-21 DIAGNOSIS — J309 Allergic rhinitis, unspecified: Secondary | ICD-10-CM | POA: Diagnosis not present

## 2023-08-21 DIAGNOSIS — N183 Chronic kidney disease, stage 3 unspecified: Secondary | ICD-10-CM | POA: Diagnosis not present

## 2023-08-21 DIAGNOSIS — N179 Acute kidney failure, unspecified: Secondary | ICD-10-CM | POA: Diagnosis not present

## 2023-08-21 DIAGNOSIS — Z8781 Personal history of (healed) traumatic fracture: Secondary | ICD-10-CM | POA: Diagnosis not present

## 2023-08-21 DIAGNOSIS — M4800 Spinal stenosis, site unspecified: Secondary | ICD-10-CM | POA: Diagnosis not present

## 2023-08-21 DIAGNOSIS — Z604 Social exclusion and rejection: Secondary | ICD-10-CM | POA: Diagnosis not present

## 2023-08-21 DIAGNOSIS — I350 Nonrheumatic aortic (valve) stenosis: Secondary | ICD-10-CM | POA: Diagnosis not present

## 2023-08-21 DIAGNOSIS — Z556 Problems related to health literacy: Secondary | ICD-10-CM | POA: Diagnosis not present

## 2023-08-21 DIAGNOSIS — Z5181 Encounter for therapeutic drug level monitoring: Secondary | ICD-10-CM

## 2023-08-21 DIAGNOSIS — D72829 Elevated white blood cell count, unspecified: Secondary | ICD-10-CM | POA: Diagnosis not present

## 2023-08-21 DIAGNOSIS — M810 Age-related osteoporosis without current pathological fracture: Secondary | ICD-10-CM | POA: Diagnosis not present

## 2023-08-21 DIAGNOSIS — E440 Moderate protein-calorie malnutrition: Secondary | ICD-10-CM | POA: Diagnosis not present

## 2023-08-21 DIAGNOSIS — I4891 Unspecified atrial fibrillation: Secondary | ICD-10-CM | POA: Diagnosis not present

## 2023-08-21 DIAGNOSIS — I5042 Chronic combined systolic (congestive) and diastolic (congestive) heart failure: Secondary | ICD-10-CM | POA: Diagnosis not present

## 2023-08-21 DIAGNOSIS — Z860100 Personal history of colon polyps, unspecified: Secondary | ICD-10-CM | POA: Diagnosis not present

## 2023-08-21 LAB — POCT INR: INR: 8 — AB (ref 2.0–3.0)

## 2023-08-21 LAB — PROTIME-INR
INR: 7.8 (ref 0.9–1.2)
Prothrombin Time: 75.5 s — ABNORMAL HIGH (ref 9.1–12.0)

## 2023-08-21 NOTE — Progress Notes (Signed)
 STAT INR-7.8; Pt recently discharged from hospital, admission dates are 7/26-7/31 with INR 8.4 on yesterday, denies any bleeding or bruising, pt has taken more warfarin on Sundays than advised and warfarin is currently on hold due to supratherapeutic readings. Will continue to hold warfarin and recheck INR on Monday and plan for new warfarin dose once able to resume.  Please see anticoagulation encounter for more details.

## 2023-08-21 NOTE — Patient Instructions (Addendum)
 Description   Spoke with patient and daughter [name] and advised not to take any warfarin today, no warfarin tomorrow, no warfarin Sunday and report to have INR checked on Monday. Please have leafy green vegetables today and throughout the weekend. Monitor for bleeding and bruising and report to ER with any bleeding, falls, accidents, or incidents. Recheck INR on Monday.  New Dose Plan: Warfarin 1 tablet (5mg ) daily. Remain consistent with your 2 serving of greens per week and your 1 ensure a week. Coumadin  Clinic [phone].

## 2023-08-21 NOTE — Telephone Encounter (Signed)
 Pt was admitted to our service

## 2023-08-21 NOTE — Transitions of Care (Post Inpatient/ED Visit) (Signed)
   08/21/2023  Name: [name] MRN: [id number] DOB: 07-23-1935  Today's TOC FU Call Status: Today's TOC FU Call Status:: Unsuccessful Call (1st Attempt) Unsuccessful Call (1st Attempt) Date: 08/21/23  Attempted to reach the patient regarding the most recent Inpatient/ED visit. Patient unavailable due to home health coming in and home oxygen setting up.   Follow Up Plan: Additional outreach attempts will be made to reach the patient to complete the Transitions of Care (Post Inpatient/ED visit) call.    Bing Edison MSN, RN RN Case Sales executive Health  [facility] Office Hours M-F [phone] Direct Dial: [phone] Main Phone [phone]  Fax: [phone] [email or web address]

## 2023-08-24 ENCOUNTER — Ambulatory Visit (INDEPENDENT_AMBULATORY_CARE_PROVIDER_SITE_OTHER): Admitting: Family Medicine

## 2023-08-24 ENCOUNTER — Telehealth: Payer: Self-pay

## 2023-08-24 ENCOUNTER — Encounter: Payer: Self-pay | Admitting: Family Medicine

## 2023-08-24 ENCOUNTER — Ambulatory Visit: Attending: Internal Medicine | Admitting: *Deleted

## 2023-08-24 VITALS — BP 130/72 | HR 98 | Resp 16

## 2023-08-24 DIAGNOSIS — Z952 Presence of prosthetic heart valve: Secondary | ICD-10-CM

## 2023-08-24 DIAGNOSIS — Z5181 Encounter for therapeutic drug level monitoring: Secondary | ICD-10-CM | POA: Diagnosis not present

## 2023-08-24 DIAGNOSIS — R300 Dysuria: Secondary | ICD-10-CM

## 2023-08-24 DIAGNOSIS — I482 Chronic atrial fibrillation, unspecified: Secondary | ICD-10-CM | POA: Diagnosis not present

## 2023-08-24 DIAGNOSIS — Z7901 Long term (current) use of anticoagulants: Secondary | ICD-10-CM | POA: Diagnosis not present

## 2023-08-24 DIAGNOSIS — E440 Moderate protein-calorie malnutrition: Secondary | ICD-10-CM

## 2023-08-24 LAB — POCT INR: INR: 1.9 — AB (ref 2.0–3.0)

## 2023-08-24 MED ORDER — FLUTICASONE PROPIONATE 50 MCG/ACT NA SUSP: NASAL | 6 refills | Status: AC

## 2023-08-24 NOTE — Assessment & Plan Note (Signed)
 Rate control seems moderate. I will leave to cardiology to further change meds. She is asymptomatic unless she is up and moving, then she becomes tachycardic 100-115 and some SOB with small amount of exertion. Discussed her ECHO results with her and her daughter. Advised her not to push' and to sit down when her rate is high or she is experiencing SOB. Debunked her ability to strengthen her heart by pushinh harder, which we also discussed in hospital. Discussed red flags to go to ER (dizzy, increased SOB at rest, heart racing)

## 2023-08-24 NOTE — Assessment & Plan Note (Signed)
 Her weigt has been prety stable but I encouraged more protein intake

## 2023-08-24 NOTE — Progress Notes (Signed)
 INR 1.9; Please see anticoagulation encounter

## 2023-08-24 NOTE — Transitions of Care (Post Inpatient/ED Visit) (Signed)
   08/24/2023  Name: [name] MRN: [id number] DOB: 01/21/1935  Today's TOC FU Call Status: Today's TOC FU Call Status:: Unsuccessful Call (2nd Attempt) Unsuccessful Call (2nd Attempt) Date: 08/24/23  Attempted to reach the patient regarding the most recent Inpatient/ED visit. Left a HIPAA approved voicemail message to phone number provided in demographics. Follow Up Plan: Additional outreach attempts will be made to reach the patient to complete the Transitions of Care (Post Inpatient/ED visit) call.   Richerd Fish, RN, BSN, CCM [facility], [facility] Health RN Care Manager Direct Dial: [phone]

## 2023-08-24 NOTE — Progress Notes (Signed)
    CHIEF COMPLAINT / HPI:  Here with her daughter [name]. F/u hospital stay Afib RVR, supratherapeutic INR, bladder infection.persistent nasal congestion and hearing difficulties.  No more fas theart rate that she is aware of. No more bladder pressure 3 had INR drawn at anticoag clinic and they are managing New issues: 4. Needs to reschedule her Medicare wellness visit 5. Difficulty hearing and nasal stuffinss, nasal drainage  She experiences persistent nasal congestion and a sensation of being 'choked up,' which she attributes to allergies or sinus issues. She has not been using any medication for this condition recently, although she recalls being advised to use Flonase  nasal spray in the past. No recent use of Flonase  for her nasal congestion.  6. She mentions a history of weight loss concerns, although her weight has remained stable over the past year.  PERTINENT  PMH / PSH: I have reviewed the patient's medications, allergies, past medical and surgical history, smoking status and updated in the EMR as appropriate.   OBJECTIVE:  BP 130/72 (BP Location: Right Arm, Cuff Size: Normal)   Pulse 98   Resp 16  Physical Exam MEASUREMENTS: Weight- 151 pounds. HEENT: Cerumen present in LEFT ear, right TM retracted OP clear CV IRREG Irreg Loud murmur.no jvd Walking with a walker. MSK: Movement of extremity x 4.  ASSESSMENT / PLAN:   Chronic atrial fibrillation (HCC) Rate control seems moderate. I will leave to cardiology to further change meds. She is asymptomatic unless she is up and moving, then she becomes tachycardic 100-115 and some SOB with small amount of exertion. Discussed her ECHO results with her and her daughter. Advised her not to push' and to sit down when her rate is high or she is experiencing SOB. Debunked her ability to strengthen her heart by pushinh harder, which we also discussed in hospital. Discussed red flags to go to ER (dizzy, increased SOB at rest,  heart racing)  Long term current use of anticoagulant therapy She has returned to cardiology antocoag clinic for their continued management of this. Not a DOAC candidate as she has mechanical valve.  Malnutrition of moderate degree (HCC) Her weigt has been prety stable but I encouraged more protein intake  Dysuria resolved Presbycussis: worsened by eustachian tube dysfunction. Will add fluticasone . She also has some mild cerumen so adding OTC debrox. Needs full hearing eval and daughter asked for recommendation; resources given F/u 2-3 months, sooner with problems.   [name] MD

## 2023-08-24 NOTE — Assessment & Plan Note (Signed)
 resolved

## 2023-08-24 NOTE — Patient Instructions (Signed)
 Description   Today take 1.5 tablets of warfarin then START taking 1 tablet of warfarin daily except 1.5 tablets on Mondays. Recheck INR in 1 week. Remain consistent with your 2 serving of greens per week and your 1 ensure a week. Coumadin  Clinic [phone].

## 2023-08-24 NOTE — Patient Instructions (Addendum)
 [facility] hearing evaluation  Address: [address], [place], [place] [address] Phone: [phone]   Get some OTC debrox ear drops and use in left ear twice a day for a week. You can use it once a week in both ears to keep the wax away  Lets plan to see you in 2-3 months. Call me or use MyChart if you need to be seen sooner.

## 2023-08-24 NOTE — Assessment & Plan Note (Signed)
 She has returned to cardiology antocoag clinic for their continued management of this. Not a DOAC candidate as she has mechanical valve.

## 2023-08-24 NOTE — Telephone Encounter (Signed)
 Erin [facility] PT with [facility] calls nurse line requesting VOs for [facility] PT as follows.   1x a week for 9 weeks.   VO given per [facility] protocol.

## 2023-08-26 ENCOUNTER — Telehealth: Payer: Self-pay

## 2023-08-26 NOTE — Telephone Encounter (Signed)
 Patients daughter [name] calls nurse line in regards to hearing aides.   She reports she was given a location and phone # by PCP, however they do not take her insurance. She reports they will have to pay out of pocket.   She is requesting a referral to Audiology.   Advised will forward to PCP.

## 2023-08-31 ENCOUNTER — Ambulatory Visit: Attending: Internal Medicine

## 2023-08-31 DIAGNOSIS — Z952 Presence of prosthetic heart valve: Secondary | ICD-10-CM | POA: Diagnosis not present

## 2023-08-31 LAB — POCT INR: INR: 2.3 (ref 2.0–3.0)

## 2023-08-31 NOTE — Telephone Encounter (Signed)
 Spoke to patient's daughter [name] and informed her of message form Dr. [name] concerning hearing aids for her mom.  If patient still needs a referral, [name] will let us  know.  Cena JONELLE Pesa, CMA

## 2023-08-31 NOTE — Progress Notes (Signed)
 INR-2.3; Please see anticoagulation encounter

## 2023-08-31 NOTE — Patient Instructions (Signed)
 Description   Today take 2 tablets of warfarin then resume taking 1 tablet of warfarin daily except 1.5 tablets on Mondays.  Recheck INR in 1 week.  Remain consistent with your 2 serving of greens per week and your 1 ensure a week. Coumadin  Clinic [phone].

## 2023-09-03 ENCOUNTER — Encounter

## 2023-09-08 NOTE — Progress Notes (Unsigned)
  Electrophysiology Office Note:   Date:  09/10/2023  ID:  DYONNA JASPERS, DOB 13-Apr-1935, MRN [id number]  Primary Cardiologist: Danelle Birmingham, MD Primary Heart Failure: None Electrophysiologist: Danelle Birmingham, MD      History of Present Illness:   [name] is a 88 y.o. female with h/o AF, HFrEF, VT, prolonged QT, MS s/p St. Jude mechanical MV replacement, GERD, CKD III seen today for post hospital follow up.    Admitted 7/26-7/31/25 for complicated UTI with suprapubic catheter and AFwRVR.    Since discharge from hospital the patient reports she has been tired after her hospital admit. She has been off her hydralazine  since the admit and was not restarted on it at her last PCP visit.  She has not had any issues with bleeding on coumadin .     She denies chest pain, palpitations, dyspnea, PND, orthopnea, nausea, vomiting, dizziness, syncope, edema, weight gain, or early satiety.   Review of systems complete and found to be negative unless listed in HPI.   EP Information / Studies Reviewed:    EKG is ordered today. Personal review as below.  EKG Interpretation Date/Time:  Thursday September 10 2023 09:08:49 EDT Ventricular Rate:  89 PR Interval:    QRS Duration:  176 QT Interval:  422 QTC Calculation: 513 R Axis:   -32  Text Interpretation: Atrial fibrillation Left axis deviation Right bundle branch block Confirmed by Aniceto Jarvis ([id number]) on 09/10/2023 9:43:53 AM   Arrhythmia / AAD AF   Risk Assessment/Calculations:    CHA2DS2-VASc Score = 6   This indicates a 9.7% annual risk of stroke. The patient's score is based upon: CHF History: 1 HTN History: 1 Diabetes History: 0 Stroke History: 0 Vascular Disease History: 1 Age Score: 2 Gender Score: 1             Physical Exam:   VS:  BP 116/75   Pulse 89   Ht 5' 8 (1.727 m)   Wt 147 lb (66.7 kg)   SpO2 95%   BMI 22.35 kg/m    Wt Readings from Last 3 Encounters:  09/10/23 147 lb (66.7 kg)  08/20/23 151 lb 14.4  oz (68.9 kg)  06/26/23 149 lb (67.6 kg)     GEN: Well nourished, well developed in no acute distress NECK: No JVD; No carotid bruits CARDIAC: Irregularly irregular rate and rhythm, no murmurs, rubs, gallops RESPIRATORY:  Clear to auscultation without rales, wheezing or rhonchi  ABDOMEN: Soft, non-tender, non-distended EXTREMITIES:  No edema; No deformity   ASSESSMENT AND PLAN:    Permanent Atrial Fibrillation  RBBB CHA2DS2-VASc 6 -EKG with AF, controlled ventricular rate  -continue diltiazem  CD 180 mg daily & Lopressor  100 mg BID -OAC for stroke prophylaxis  -most recent RVR episodes driven by acute infection  -rates controlled currently   Secondary Hypercoagulable State  -continue Coumadin   -follows with Coumadin  Clinic  VHD: MS s/p St. Jude Mechanical Mitral Valve Replacement  -coumadin  with mechanical valve  -last INR 08/31/23 2.3 (was supratherapeutic on recent admit)  Hypertension  -well controlled on current regimen   -remove hydralazine  from med list > has been off since hospital admit, not restarted at PCP visit.  Will remove for now at patients request so her med list is updated  NSVT  -Toprol  as above   Follow up with Dr. Birmingham / EP APP in 6 months  Signed, Jarvis Aniceto, NP-C, AGACNP-BC [facility] - Electrophysiology  09/10/2023, 9:44 AM

## 2023-09-10 ENCOUNTER — Encounter: Payer: Self-pay | Admitting: Pulmonary Disease

## 2023-09-10 ENCOUNTER — Ambulatory Visit: Attending: Internal Medicine

## 2023-09-10 ENCOUNTER — Ambulatory Visit: Attending: Pulmonary Disease | Admitting: Pulmonary Disease

## 2023-09-10 VITALS — BP 116/75 | HR 89 | Ht 68.0 in | Wt 147.0 lb

## 2023-09-10 DIAGNOSIS — I482 Chronic atrial fibrillation, unspecified: Secondary | ICD-10-CM | POA: Diagnosis not present

## 2023-09-10 DIAGNOSIS — I4729 Other ventricular tachycardia: Secondary | ICD-10-CM

## 2023-09-10 DIAGNOSIS — D6869 Other thrombophilia: Secondary | ICD-10-CM

## 2023-09-10 DIAGNOSIS — Z952 Presence of prosthetic heart valve: Secondary | ICD-10-CM | POA: Diagnosis not present

## 2023-09-10 LAB — POCT INR: INR: 4.6 — AB (ref 2.0–3.0)

## 2023-09-10 NOTE — Patient Instructions (Addendum)
 Description   HOLD today's dose and only take 1/2 tablet tomorrow and then resume taking 1 tablet of warfarin daily except 1.5 tablets on Mondays.  Recheck INR in 1 week.  Remain consistent with your 2 serving of greens per week and your 1 ensure a week. Coumadin  Clinic [phone].

## 2023-09-10 NOTE — Progress Notes (Addendum)
 INR 4.6 Description   HOLD today's dose and only take 1/2 tablet tomorrow and then resume taking 1 tablet of warfarin daily except 1.5 tablets on Mondays.  Recheck INR in 1 week.  Remain consistent with your 2 serving of greens per week and your 1 ensure a week. Coumadin  Clinic [phone].

## 2023-09-10 NOTE — Patient Instructions (Signed)
 Medication Instructions:  Your physician recommends that you continue on your current medications as directed. Please refer to the Current Medication list given to you today.  *If you need a refill on your cardiac medications before your next appointment, please call your pharmacy*  Lab Work: None ordered If you have labs (blood work) drawn today and your tests are completely normal, you will receive your results only by: MyChart Message (if you have MyChart) OR A paper copy in the mail If you have any lab test that is abnormal or we need to change your treatment, we will call you to review the results.  Follow-Up: At [facility], you and your health needs are our priority.  As part of our continuing mission to provide you with exceptional heart care, our providers are all part of one team.  This team includes your primary Cardiologist (physician) and Advanced Practice Providers or APPs (Physician Assistants and Nurse Practitioners) who all work together to provide you with the care you need, when you need it.  Your next appointment:   6 month(s)  Provider:   Danelle Birmingham, MD or Daphne Barrack, NP    We recommend signing up for the patient portal called MyChart.  Sign up information is provided on this After Visit Summary.  MyChart is used to connect with patients for Virtual Visits (Telemedicine).  Patients are able to view lab/test results, encounter notes, upcoming appointments, etc.  Non-urgent messages can be sent to your provider as well.   To learn more about what you can do with MyChart, go to [email or web address].

## 2023-09-16 ENCOUNTER — Ambulatory Visit: Admitting: Podiatry

## 2023-09-16 ENCOUNTER — Telehealth: Payer: Self-pay | Admitting: Internal Medicine

## 2023-09-16 NOTE — Telephone Encounter (Signed)
 Pt c/o medication issue:  1. Name of Medication: metoprolol  tartrate (LOPRESSOR ) 100 MG tablet   2. How are you currently taking this medication (dosage and times per day)? As directed, 2x daily  3. Are you having a reaction (difficulty breathing--STAT)? Yes, sleepy  4. What is your medication issue? Patient states that this medication is making her very very tired and she wants to sleep constantly. She wants to know if she should cut back to 1x daily or if not, how does she need to space them out? Every 12 hours? Please advise.

## 2023-09-16 NOTE — Telephone Encounter (Signed)
 Called patient back about message. Patient stated she has been taking metoprolol  100 mg BID for over 3 weeks. Patient stated she feels sleepy all the time now. Patient has not been taking medication every 12 hours, but more like 8 to 10 hours apart. Patient stated her BP and HR have been fine, but she is afraid to do anything due to feeling so sleepy. Will send message to Dr. Waddell for advisement.

## 2023-09-17 ENCOUNTER — Other Ambulatory Visit (HOSPITAL_COMMUNITY): Payer: Self-pay

## 2023-09-17 ENCOUNTER — Other Ambulatory Visit: Payer: Self-pay

## 2023-09-17 DIAGNOSIS — Z9049 Acquired absence of other specified parts of digestive tract: Secondary | ICD-10-CM | POA: Diagnosis not present

## 2023-09-17 DIAGNOSIS — I5042 Chronic combined systolic (congestive) and diastolic (congestive) heart failure: Secondary | ICD-10-CM | POA: Diagnosis not present

## 2023-09-17 DIAGNOSIS — Z556 Problems related to health literacy: Secondary | ICD-10-CM | POA: Diagnosis not present

## 2023-09-17 DIAGNOSIS — K219 Gastro-esophageal reflux disease without esophagitis: Secondary | ICD-10-CM | POA: Diagnosis not present

## 2023-09-17 DIAGNOSIS — I130 Hypertensive heart and chronic kidney disease with heart failure and stage 1 through stage 4 chronic kidney disease, or unspecified chronic kidney disease: Secondary | ICD-10-CM | POA: Diagnosis not present

## 2023-09-17 DIAGNOSIS — N179 Acute kidney failure, unspecified: Secondary | ICD-10-CM | POA: Diagnosis not present

## 2023-09-17 DIAGNOSIS — I428 Other cardiomyopathies: Secondary | ICD-10-CM | POA: Diagnosis not present

## 2023-09-17 DIAGNOSIS — N183 Chronic kidney disease, stage 3 unspecified: Secondary | ICD-10-CM | POA: Diagnosis not present

## 2023-09-17 DIAGNOSIS — Z604 Social exclusion and rejection: Secondary | ICD-10-CM | POA: Diagnosis not present

## 2023-09-17 DIAGNOSIS — M4800 Spinal stenosis, site unspecified: Secondary | ICD-10-CM | POA: Diagnosis not present

## 2023-09-17 DIAGNOSIS — K5730 Diverticulosis of large intestine without perforation or abscess without bleeding: Secondary | ICD-10-CM | POA: Diagnosis not present

## 2023-09-17 DIAGNOSIS — I350 Nonrheumatic aortic (valve) stenosis: Secondary | ICD-10-CM | POA: Diagnosis not present

## 2023-09-17 DIAGNOSIS — D72829 Elevated white blood cell count, unspecified: Secondary | ICD-10-CM | POA: Diagnosis not present

## 2023-09-17 DIAGNOSIS — E440 Moderate protein-calorie malnutrition: Secondary | ICD-10-CM | POA: Diagnosis not present

## 2023-09-17 DIAGNOSIS — Z952 Presence of prosthetic heart valve: Secondary | ICD-10-CM | POA: Diagnosis not present

## 2023-09-17 DIAGNOSIS — I4821 Permanent atrial fibrillation: Secondary | ICD-10-CM | POA: Diagnosis not present

## 2023-09-17 DIAGNOSIS — M810 Age-related osteoporosis without current pathological fracture: Secondary | ICD-10-CM | POA: Diagnosis not present

## 2023-09-17 DIAGNOSIS — J309 Allergic rhinitis, unspecified: Secondary | ICD-10-CM | POA: Diagnosis not present

## 2023-09-17 DIAGNOSIS — Z8781 Personal history of (healed) traumatic fracture: Secondary | ICD-10-CM | POA: Diagnosis not present

## 2023-09-17 DIAGNOSIS — Z860100 Personal history of colon polyps, unspecified: Secondary | ICD-10-CM | POA: Diagnosis not present

## 2023-09-17 MED ORDER — DILTIAZEM HCL ER COATED BEADS 180 MG PO CP24: 180.0000 mg | ORAL_CAPSULE | Freq: Every day | ORAL | 3 refills | Status: AC

## 2023-09-17 MED ORDER — FUROSEMIDE 40 MG PO TABS: 40.0000 mg | ORAL_TABLET | Freq: Every day | ORAL | 3 refills | Status: AC

## 2023-09-17 MED ORDER — METOPROLOL TARTRATE 100 MG PO TABS: 100.0000 mg | ORAL_TABLET | Freq: Two times a day (BID) | ORAL | 3 refills | Status: AC

## 2023-09-17 NOTE — Telephone Encounter (Signed)
 Reviewed most recent cardiology note and it appears she is supposed to be on metoprolol  and diltiazem . Someone discontinued it on med list (diltiazem ) but I think that was supposed to be hydralazine  that was discontinued. Will refill diltiazem , metoprolol  and lasix .

## 2023-09-18 ENCOUNTER — Ambulatory Visit: Attending: Internal Medicine | Admitting: *Deleted

## 2023-09-18 DIAGNOSIS — Z5181 Encounter for therapeutic drug level monitoring: Secondary | ICD-10-CM

## 2023-09-18 DIAGNOSIS — Z952 Presence of prosthetic heart valve: Secondary | ICD-10-CM | POA: Diagnosis not present

## 2023-09-18 LAB — POCT INR: INR: 5.5 — AB (ref 2.0–3.0)

## 2023-09-18 NOTE — Patient Instructions (Addendum)
 Description   INR-5.5; Do not take any warfarin today and do not take any tomorrow then START taking 1 tablet of warfarin daily except 1/2 tablet on Wednesdays.  Recheck INR in 1 week.  Remain consistent with your 2 serving of greens per week and your 1 ensure a week. Coumadin  Clinic [phone].

## 2023-09-18 NOTE — Progress Notes (Addendum)
 Description   INR-5.5; Do not take any warfarin today and do not take any tomorrow then START taking 1 tablet of warfarin daily except 1/2 tablet on Wednesdays.  Recheck INR in 1 week.  Remain consistent with your 2 serving of greens per week and your 1 ensure a week. Coumadin  Clinic [phone].

## 2023-09-21 DIAGNOSIS — I4891 Unspecified atrial fibrillation: Secondary | ICD-10-CM | POA: Diagnosis not present

## 2023-09-24 ENCOUNTER — Ambulatory Visit: Attending: Internal Medicine | Admitting: *Deleted

## 2023-09-24 DIAGNOSIS — Z952 Presence of prosthetic heart valve: Secondary | ICD-10-CM

## 2023-09-24 DIAGNOSIS — Z5181 Encounter for therapeutic drug level monitoring: Secondary | ICD-10-CM

## 2023-09-24 LAB — POCT INR: POC INR: 4

## 2023-09-24 NOTE — Patient Instructions (Signed)
 Description    Hold warfarin today then START taking warfarin 1 tablet daily except for 1/2 a tablet on Monday, Wednesday and Friday. Recheck INR in 1 week.  Remain consistent with your 2 serving of greens per week and your 1 ensure a week. Coumadin  Clinic [phone].

## 2023-09-24 NOTE — Progress Notes (Signed)
 INR 4.0; Please see anticoagulation encounter Lab Results  Component Value Date   INR 4.0 09/24/2023   INR 5.5 (A) 09/18/2023   INR 4.6 (A) 09/10/2023   PROTIME 18.5 06/21/2008    Description    Hold warfarin today then START taking warfarin 1 tablet daily except for 1/2 a tablet on Monday, Wednesday and Friday. Recheck INR in 1 week.  Remain consistent with your 2 serving of greens per week and your 1 ensure a week. Coumadin  Clinic [phone].

## 2023-09-25 DIAGNOSIS — N179 Acute kidney failure, unspecified: Secondary | ICD-10-CM | POA: Diagnosis not present

## 2023-09-25 DIAGNOSIS — M4800 Spinal stenosis, site unspecified: Secondary | ICD-10-CM | POA: Diagnosis not present

## 2023-09-25 DIAGNOSIS — Z952 Presence of prosthetic heart valve: Secondary | ICD-10-CM | POA: Diagnosis not present

## 2023-09-25 DIAGNOSIS — Z556 Problems related to health literacy: Secondary | ICD-10-CM | POA: Diagnosis not present

## 2023-09-25 DIAGNOSIS — E440 Moderate protein-calorie malnutrition: Secondary | ICD-10-CM | POA: Diagnosis not present

## 2023-09-25 DIAGNOSIS — I5042 Chronic combined systolic (congestive) and diastolic (congestive) heart failure: Secondary | ICD-10-CM | POA: Diagnosis not present

## 2023-09-25 DIAGNOSIS — I428 Other cardiomyopathies: Secondary | ICD-10-CM | POA: Diagnosis not present

## 2023-09-25 DIAGNOSIS — Z9049 Acquired absence of other specified parts of digestive tract: Secondary | ICD-10-CM | POA: Diagnosis not present

## 2023-09-25 DIAGNOSIS — Z860100 Personal history of colon polyps, unspecified: Secondary | ICD-10-CM | POA: Diagnosis not present

## 2023-09-25 DIAGNOSIS — K5730 Diverticulosis of large intestine without perforation or abscess without bleeding: Secondary | ICD-10-CM | POA: Diagnosis not present

## 2023-09-25 DIAGNOSIS — I350 Nonrheumatic aortic (valve) stenosis: Secondary | ICD-10-CM | POA: Diagnosis not present

## 2023-09-25 DIAGNOSIS — M810 Age-related osteoporosis without current pathological fracture: Secondary | ICD-10-CM | POA: Diagnosis not present

## 2023-09-25 DIAGNOSIS — J309 Allergic rhinitis, unspecified: Secondary | ICD-10-CM | POA: Diagnosis not present

## 2023-09-25 DIAGNOSIS — D72829 Elevated white blood cell count, unspecified: Secondary | ICD-10-CM | POA: Diagnosis not present

## 2023-09-25 DIAGNOSIS — I130 Hypertensive heart and chronic kidney disease with heart failure and stage 1 through stage 4 chronic kidney disease, or unspecified chronic kidney disease: Secondary | ICD-10-CM | POA: Diagnosis not present

## 2023-09-25 DIAGNOSIS — Z604 Social exclusion and rejection: Secondary | ICD-10-CM | POA: Diagnosis not present

## 2023-09-25 DIAGNOSIS — K219 Gastro-esophageal reflux disease without esophagitis: Secondary | ICD-10-CM | POA: Diagnosis not present

## 2023-09-25 DIAGNOSIS — Z8781 Personal history of (healed) traumatic fracture: Secondary | ICD-10-CM | POA: Diagnosis not present

## 2023-09-25 DIAGNOSIS — N183 Chronic kidney disease, stage 3 unspecified: Secondary | ICD-10-CM | POA: Diagnosis not present

## 2023-09-28 ENCOUNTER — Telehealth: Payer: Self-pay

## 2023-09-28 NOTE — Telephone Encounter (Signed)
 Patient calls nurse line in regards to a form.   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   She reports this was faxed last week.  I advised I did not see form in PCP box.   Will forward to PCP.  Patient to call back Wednesday if form is still not received.

## 2023-10-01 ENCOUNTER — Ambulatory Visit: Attending: Internal Medicine | Admitting: *Deleted

## 2023-10-01 DIAGNOSIS — Z5181 Encounter for therapeutic drug level monitoring: Secondary | ICD-10-CM

## 2023-10-01 DIAGNOSIS — Z952 Presence of prosthetic heart valve: Secondary | ICD-10-CM

## 2023-10-01 LAB — POCT INR: POC INR: 3.5

## 2023-10-01 NOTE — Progress Notes (Signed)
 INR 3.5; Please see anticoagulation encounter Lab Results  Component Value Date   INR 3.5 10/01/2023   INR 4.0 09/24/2023   INR 5.5 (A) 09/18/2023   PROTIME 18.5 06/21/2008    Description   Start taking Warfarin 1/2 a tablet every day except for 1 tablet on Sunday, Tuesday and Thursday . Recheck INR in 2 weeks.   Remain consistent with your 2 serving of greens per week and your 1 ensure a week. Coumadin  Clinic [phone].

## 2023-10-01 NOTE — Patient Instructions (Addendum)
 Description   Start taking Warfarin 1/2 a tablet every day except for 1 tablet on Sunday, Tuesday and Thursday . Recheck INR in 2 weeks.   Remain consistent with your 2 serving of greens per week and your 1 ensure a week. Coumadin  Clinic [phone].

## 2023-10-02 DIAGNOSIS — N183 Chronic kidney disease, stage 3 unspecified: Secondary | ICD-10-CM | POA: Diagnosis not present

## 2023-10-02 DIAGNOSIS — I428 Other cardiomyopathies: Secondary | ICD-10-CM | POA: Diagnosis not present

## 2023-10-02 DIAGNOSIS — I350 Nonrheumatic aortic (valve) stenosis: Secondary | ICD-10-CM | POA: Diagnosis not present

## 2023-10-02 DIAGNOSIS — N179 Acute kidney failure, unspecified: Secondary | ICD-10-CM | POA: Diagnosis not present

## 2023-10-02 DIAGNOSIS — I4821 Permanent atrial fibrillation: Secondary | ICD-10-CM | POA: Diagnosis not present

## 2023-10-02 DIAGNOSIS — Z952 Presence of prosthetic heart valve: Secondary | ICD-10-CM | POA: Diagnosis not present

## 2023-10-02 DIAGNOSIS — I5042 Chronic combined systolic (congestive) and diastolic (congestive) heart failure: Secondary | ICD-10-CM | POA: Diagnosis not present

## 2023-10-02 DIAGNOSIS — Z8781 Personal history of (healed) traumatic fracture: Secondary | ICD-10-CM | POA: Diagnosis not present

## 2023-10-02 DIAGNOSIS — K5730 Diverticulosis of large intestine without perforation or abscess without bleeding: Secondary | ICD-10-CM | POA: Diagnosis not present

## 2023-10-02 DIAGNOSIS — Z604 Social exclusion and rejection: Secondary | ICD-10-CM | POA: Diagnosis not present

## 2023-10-02 DIAGNOSIS — Z9049 Acquired absence of other specified parts of digestive tract: Secondary | ICD-10-CM | POA: Diagnosis not present

## 2023-10-02 DIAGNOSIS — D72829 Elevated white blood cell count, unspecified: Secondary | ICD-10-CM | POA: Diagnosis not present

## 2023-10-02 DIAGNOSIS — K219 Gastro-esophageal reflux disease without esophagitis: Secondary | ICD-10-CM | POA: Diagnosis not present

## 2023-10-02 DIAGNOSIS — M4800 Spinal stenosis, site unspecified: Secondary | ICD-10-CM | POA: Diagnosis not present

## 2023-10-02 DIAGNOSIS — I130 Hypertensive heart and chronic kidney disease with heart failure and stage 1 through stage 4 chronic kidney disease, or unspecified chronic kidney disease: Secondary | ICD-10-CM | POA: Diagnosis not present

## 2023-10-02 DIAGNOSIS — Z556 Problems related to health literacy: Secondary | ICD-10-CM | POA: Diagnosis not present

## 2023-10-02 DIAGNOSIS — Z860100 Personal history of colon polyps, unspecified: Secondary | ICD-10-CM | POA: Diagnosis not present

## 2023-10-02 DIAGNOSIS — J309 Allergic rhinitis, unspecified: Secondary | ICD-10-CM | POA: Diagnosis not present

## 2023-10-02 DIAGNOSIS — E440 Moderate protein-calorie malnutrition: Secondary | ICD-10-CM | POA: Diagnosis not present

## 2023-10-02 DIAGNOSIS — M810 Age-related osteoporosis without current pathological fracture: Secondary | ICD-10-CM | POA: Diagnosis not present

## 2023-10-05 ENCOUNTER — Telehealth: Payer: Self-pay

## 2023-10-05 NOTE — Telephone Encounter (Signed)
 Patient calls nurse line requesting that we send an order for home oxygen to be removed from her home.   Patient was discharged with oxygen at the end of July. She reports only having to use this for a few days. She has not had to use any oxygen within the last month. Denies any issues with her breathing.   Oxygen company- [employer]  Will forward to Dr. [name] for D/c order if appropriate.   Chiquita JAYSON English, RN

## 2023-10-06 ENCOUNTER — Other Ambulatory Visit: Payer: Self-pay | Admitting: Family Medicine

## 2023-10-06 NOTE — Telephone Encounter (Signed)
 Called [employer] at [phone] and LVM for oxygen to be removed from patient's home per Dr. [name].  .Rosaline JONELLE Pesa, CMA

## 2023-10-07 DIAGNOSIS — M4800 Spinal stenosis, site unspecified: Secondary | ICD-10-CM | POA: Diagnosis not present

## 2023-10-07 DIAGNOSIS — K5730 Diverticulosis of large intestine without perforation or abscess without bleeding: Secondary | ICD-10-CM | POA: Diagnosis not present

## 2023-10-07 DIAGNOSIS — I130 Hypertensive heart and chronic kidney disease with heart failure and stage 1 through stage 4 chronic kidney disease, or unspecified chronic kidney disease: Secondary | ICD-10-CM | POA: Diagnosis not present

## 2023-10-07 DIAGNOSIS — Z952 Presence of prosthetic heart valve: Secondary | ICD-10-CM | POA: Diagnosis not present

## 2023-10-07 DIAGNOSIS — J309 Allergic rhinitis, unspecified: Secondary | ICD-10-CM | POA: Diagnosis not present

## 2023-10-07 DIAGNOSIS — I5042 Chronic combined systolic (congestive) and diastolic (congestive) heart failure: Secondary | ICD-10-CM | POA: Diagnosis not present

## 2023-10-07 DIAGNOSIS — Z604 Social exclusion and rejection: Secondary | ICD-10-CM | POA: Diagnosis not present

## 2023-10-07 DIAGNOSIS — Z8781 Personal history of (healed) traumatic fracture: Secondary | ICD-10-CM | POA: Diagnosis not present

## 2023-10-07 DIAGNOSIS — I428 Other cardiomyopathies: Secondary | ICD-10-CM | POA: Diagnosis not present

## 2023-10-07 DIAGNOSIS — E440 Moderate protein-calorie malnutrition: Secondary | ICD-10-CM | POA: Diagnosis not present

## 2023-10-07 DIAGNOSIS — K219 Gastro-esophageal reflux disease without esophagitis: Secondary | ICD-10-CM | POA: Diagnosis not present

## 2023-10-07 DIAGNOSIS — I350 Nonrheumatic aortic (valve) stenosis: Secondary | ICD-10-CM | POA: Diagnosis not present

## 2023-10-07 DIAGNOSIS — M810 Age-related osteoporosis without current pathological fracture: Secondary | ICD-10-CM | POA: Diagnosis not present

## 2023-10-07 DIAGNOSIS — Z9049 Acquired absence of other specified parts of digestive tract: Secondary | ICD-10-CM | POA: Diagnosis not present

## 2023-10-07 DIAGNOSIS — N183 Chronic kidney disease, stage 3 unspecified: Secondary | ICD-10-CM | POA: Diagnosis not present

## 2023-10-07 DIAGNOSIS — N179 Acute kidney failure, unspecified: Secondary | ICD-10-CM | POA: Diagnosis not present

## 2023-10-07 DIAGNOSIS — D72829 Elevated white blood cell count, unspecified: Secondary | ICD-10-CM | POA: Diagnosis not present

## 2023-10-07 DIAGNOSIS — Z860100 Personal history of colon polyps, unspecified: Secondary | ICD-10-CM | POA: Diagnosis not present

## 2023-10-07 DIAGNOSIS — Z556 Problems related to health literacy: Secondary | ICD-10-CM | POA: Diagnosis not present

## 2023-10-12 ENCOUNTER — Encounter

## 2023-10-12 ENCOUNTER — Ambulatory Visit

## 2023-10-12 ENCOUNTER — Encounter: Payer: Self-pay | Admitting: Family Medicine

## 2023-10-12 NOTE — Telephone Encounter (Signed)
 Dear Landy Team I faxed it this AM. MAEOLA! [name]

## 2023-10-14 ENCOUNTER — Telehealth: Payer: Self-pay | Admitting: Family Medicine

## 2023-10-14 NOTE — Telephone Encounter (Signed)
 [name] with [employer] came in with [facility] form that was completed. Stated that box #10 needed to be marked as home please. Please call her at [phone] when form is ready to be picked back up. Placed in Dr. Malone box

## 2023-10-16 ENCOUNTER — Ambulatory Visit: Admitting: Family Medicine

## 2023-10-16 DIAGNOSIS — M810 Age-related osteoporosis without current pathological fracture: Secondary | ICD-10-CM | POA: Diagnosis not present

## 2023-10-16 DIAGNOSIS — I130 Hypertensive heart and chronic kidney disease with heart failure and stage 1 through stage 4 chronic kidney disease, or unspecified chronic kidney disease: Secondary | ICD-10-CM | POA: Diagnosis not present

## 2023-10-16 DIAGNOSIS — K219 Gastro-esophageal reflux disease without esophagitis: Secondary | ICD-10-CM | POA: Diagnosis not present

## 2023-10-16 DIAGNOSIS — Z556 Problems related to health literacy: Secondary | ICD-10-CM | POA: Diagnosis not present

## 2023-10-16 DIAGNOSIS — I428 Other cardiomyopathies: Secondary | ICD-10-CM | POA: Diagnosis not present

## 2023-10-16 DIAGNOSIS — Z604 Social exclusion and rejection: Secondary | ICD-10-CM | POA: Diagnosis not present

## 2023-10-16 DIAGNOSIS — N183 Chronic kidney disease, stage 3 unspecified: Secondary | ICD-10-CM | POA: Diagnosis not present

## 2023-10-16 DIAGNOSIS — N179 Acute kidney failure, unspecified: Secondary | ICD-10-CM | POA: Diagnosis not present

## 2023-10-16 DIAGNOSIS — I5042 Chronic combined systolic (congestive) and diastolic (congestive) heart failure: Secondary | ICD-10-CM | POA: Diagnosis not present

## 2023-10-16 DIAGNOSIS — K5730 Diverticulosis of large intestine without perforation or abscess without bleeding: Secondary | ICD-10-CM | POA: Diagnosis not present

## 2023-10-16 DIAGNOSIS — J309 Allergic rhinitis, unspecified: Secondary | ICD-10-CM | POA: Diagnosis not present

## 2023-10-16 DIAGNOSIS — E440 Moderate protein-calorie malnutrition: Secondary | ICD-10-CM | POA: Diagnosis not present

## 2023-10-16 DIAGNOSIS — D72829 Elevated white blood cell count, unspecified: Secondary | ICD-10-CM | POA: Diagnosis not present

## 2023-10-16 DIAGNOSIS — I350 Nonrheumatic aortic (valve) stenosis: Secondary | ICD-10-CM | POA: Diagnosis not present

## 2023-10-16 DIAGNOSIS — I4821 Permanent atrial fibrillation: Secondary | ICD-10-CM | POA: Diagnosis not present

## 2023-10-16 DIAGNOSIS — Z952 Presence of prosthetic heart valve: Secondary | ICD-10-CM | POA: Diagnosis not present

## 2023-10-16 DIAGNOSIS — Z860100 Personal history of colon polyps, unspecified: Secondary | ICD-10-CM | POA: Diagnosis not present

## 2023-10-16 DIAGNOSIS — M4800 Spinal stenosis, site unspecified: Secondary | ICD-10-CM | POA: Diagnosis not present

## 2023-10-16 DIAGNOSIS — Z8781 Personal history of (healed) traumatic fracture: Secondary | ICD-10-CM | POA: Diagnosis not present

## 2023-10-16 DIAGNOSIS — Z9049 Acquired absence of other specified parts of digestive tract: Secondary | ICD-10-CM | POA: Diagnosis not present

## 2023-10-19 ENCOUNTER — Ambulatory Visit: Attending: Internal Medicine | Admitting: *Deleted

## 2023-10-19 DIAGNOSIS — Z952 Presence of prosthetic heart valve: Secondary | ICD-10-CM | POA: Diagnosis not present

## 2023-10-19 DIAGNOSIS — Z5181 Encounter for therapeutic drug level monitoring: Secondary | ICD-10-CM | POA: Diagnosis not present

## 2023-10-19 LAB — POCT INR: INR: 2.7 (ref 2.0–3.0)

## 2023-10-19 NOTE — Telephone Encounter (Signed)
 Decrease the toprol  to 50 mg twice daily. GT

## 2023-10-19 NOTE — Patient Instructions (Signed)
 Description   INR-2.7; Continue taking Warfarin 1/2 tablet every day except for 1 tablet on Sunday, Tuesday and Thursday. Recheck INR in 3 weeks.   Remain consistent with your 2 serving of greens per week and your 1 ensure a week. Coumadin  Clinic [phone].

## 2023-10-19 NOTE — Progress Notes (Signed)
 Description   INR-2.7; Continue taking Warfarin 1/2 tablet every day except for 1 tablet on Sunday, Tuesday and Thursday. Recheck INR in 3 weeks.   Remain consistent with your 2 serving of greens per week and your 1 ensure a week. Coumadin  Clinic [phone].

## 2023-10-21 DIAGNOSIS — I4891 Unspecified atrial fibrillation: Secondary | ICD-10-CM | POA: Diagnosis not present

## 2023-10-23 NOTE — Telephone Encounter (Signed)
 Left message to call back.

## 2023-11-09 ENCOUNTER — Telehealth: Payer: Self-pay | Admitting: Family Medicine

## 2023-11-09 ENCOUNTER — Ambulatory Visit: Attending: Cardiovascular Disease | Admitting: Pharmacist

## 2023-11-09 DIAGNOSIS — Z952 Presence of prosthetic heart valve: Secondary | ICD-10-CM | POA: Diagnosis not present

## 2023-11-09 DIAGNOSIS — Z7901 Long term (current) use of anticoagulants: Secondary | ICD-10-CM

## 2023-11-09 LAB — POCT INR: INR: 3 (ref 2.0–3.0)

## 2023-11-09 NOTE — Telephone Encounter (Signed)
 Spoke to Artanda at [facility] and she states that she will call patient to arrange a convenient time to pick up the oxygen tanks.  Cena JONELLE Pesa, CMA

## 2023-11-09 NOTE — Patient Instructions (Signed)
 Description   INR-3.0; Continue taking Warfarin 1/2 tablet every day except for 1 tablet on Sunday, Tuesday and Thursday. Recheck INR in 4 weeks.   Remain consistent with your 2 serving of greens per week and your 1 ensure a week. Coumadin  Clinic [phone].

## 2023-11-09 NOTE — Progress Notes (Signed)
 Description   INR-3.0; Continue taking Warfarin 1/2 tablet every day except for 1 tablet on Sunday, Tuesday and Thursday. Recheck INR in 4 weeks.   Remain consistent with your 2 serving of greens per week and your 1 ensure a week. Coumadin  Clinic [phone].

## 2023-11-12 ENCOUNTER — Telehealth: Payer: Self-pay | Admitting: Family Medicine

## 2023-11-12 NOTE — Telephone Encounter (Signed)
 Patients daughter is wanting DME oxygen picked up and they are needing a order placed for stuff to be picked up.

## 2023-11-21 DIAGNOSIS — I4891 Unspecified atrial fibrillation: Secondary | ICD-10-CM | POA: Diagnosis not present

## 2023-12-01 ENCOUNTER — Other Ambulatory Visit: Payer: Self-pay | Admitting: Internal Medicine

## 2023-12-01 DIAGNOSIS — I482 Chronic atrial fibrillation, unspecified: Secondary | ICD-10-CM

## 2023-12-02 ENCOUNTER — Telehealth: Payer: Self-pay

## 2023-12-02 NOTE — Telephone Encounter (Signed)
 Patient calls nurse line requesting refill on Pantoprazole . She reports that medication was removed after her last hospital stay.   She reports that since leaving the hospital in July, she has been having issues with indigestion and food not going down right.   She states that the pantoprazole  helped with this issue.   If appropriate, she would like refill to be sent to [facility] on [address].   Chiquita JAYSON English, RN

## 2023-12-03 MED ORDER — PANTOPRAZOLE SODIUM 40 MG PO TBEC: 40.0000 mg | DELAYED_RELEASE_TABLET | Freq: Every day | ORAL | 3 refills | Status: AC

## 2023-12-11 ENCOUNTER — Ambulatory Visit: Attending: Internal Medicine | Admitting: *Deleted

## 2023-12-11 DIAGNOSIS — Z952 Presence of prosthetic heart valve: Secondary | ICD-10-CM

## 2023-12-11 DIAGNOSIS — Z5181 Encounter for therapeutic drug level monitoring: Secondary | ICD-10-CM

## 2023-12-11 LAB — POCT INR: INR: 3.6 — AB (ref 2.0–3.0)

## 2023-12-11 NOTE — Patient Instructions (Signed)
 Description   INR-3.6; Do not take any warfarin today then continue taking Warfarin 1/2 tablet every day except for 1 tablet on Sunday, Tuesday and Thursday. Recheck INR in 4 weeks.   Remain consistent with your 2 serving of greens per week and your 1 ensure a week. Coumadin  Clinic [phone].

## 2023-12-11 NOTE — Progress Notes (Signed)
 Description   INR-3.6; Do not take any warfarin today then continue taking Warfarin 1/2 tablet every day except for 1 tablet on Sunday, Tuesday and Thursday. Recheck INR in 4 weeks.   Remain consistent with your 2 serving of greens per week and your 1 ensure a week. Coumadin  Clinic [phone].

## 2023-12-31 ENCOUNTER — Ambulatory Visit

## 2023-12-31 VITALS — Ht 68.0 in | Wt 147.0 lb

## 2023-12-31 DIAGNOSIS — Z0000 Encounter for general adult medical examination without abnormal findings: Secondary | ICD-10-CM

## 2023-12-31 NOTE — Patient Instructions (Signed)
 [name],  Thank you for taking the time for your Medicare Wellness Visit. I appreciate your continued commitment to your health goals. Please review the care plan we discussed, and feel free to reach out if I can assist you further.  Please note that Annual Wellness Visits do not include a physical exam. Some assessments may be limited, especially if the visit was conducted virtually. If needed, we may recommend an in-person follow-up with your provider.  Ongoing Care Seeing your primary care provider every 3 to 6 months helps us  monitor your health and provide consistent, personalized care.   Referrals If a referral was made during today's visit and you haven't received any updates within two weeks, please contact the referred provider directly to check on the status.  Recommended Screenings:  Health Maintenance  Topic Date Due   DTaP/Tdap/Td vaccine (3 - Tdap) 08/27/2017   Medicare Annual Wellness Visit  06/30/2023   Flu Shot  08/21/2023   COVID-19 Vaccine (2 - 2025-26 season) 09/21/2023   Pneumococcal Vaccine for age over 17  Completed   Osteoporosis screening with Bone Density Scan  Completed   Zoster (Shingles) Vaccine  Completed   Meningitis B Vaccine  Aged Out       12/31/2023    8:36 AM  Advanced Directives  Does Patient Have a Medical Advance Directive? No  Would patient like information on creating a medical advance directive? No - Patient declined    Vision: Annual vision screenings are recommended for early detection of glaucoma, cataracts, and diabetic retinopathy. These exams can also reveal signs of chronic conditions such as diabetes and high blood pressure.  Dental: Annual dental screenings help detect early signs of oral cancer, gum disease, and other conditions linked to overall health, including heart disease and diabetes.  Please see the attached documents for additional preventive care recommendations.

## 2023-12-31 NOTE — Progress Notes (Cosign Needed Addendum)
 Chief Complaint  Patient presents with   Medicare Wellness    SUBSEQUENT     Subjective:   [name] is a 88 y.o. female who presents for a Medicare Annual Wellness Visit.  Visit info / Clinical Intake: Medicare Wellness Visit Type:: Subsequent Annual Wellness Visit Persons participating in visit and providing information:: patient Medicare Wellness Visit Mode:: Telephone If telephone:: video declined Since this visit was completed virtually, some vitals may be partially provided or unavailable. Missing vitals are due to the limitations of the virtual format.: Documented vitals are patient reported If Telephone or Video please confirm:: I connected with patient using audio/video enable telemedicine. I verified patient identity with two identifiers, discussed telehealth limitations, and patient agreed to proceed. Patient Location:: HOME Provider Location:: HOME OFFICE Interpreter Needed?: No Pre-visit prep was completed: yes AWV questionnaire completed by patient prior to visit?: no Living arrangements:: in retirement community Patient's Overall Health Status Rating: very good Typical amount of pain: none Does pain affect daily life?: no Are you currently prescribed opioids?: no  Dietary Habits and Nutritional Risks How many meals a day?: 3 Eats fruit and vegetables daily?: yes Most meals are obtained by: preparing own meals In the last 2 weeks, have you had any of the following?: none Diabetic:: no  Functional Status Activities of Daily Living (to include ambulation/medication): Independent Ambulation: Independent with device- listed below Home Assistive Devices/Equipment: Johna Finder (specify Type); Eyeglasses; Elevated toliet seat; Shower/tub chair; Other (Comment) (EMERGENCY ALERTS, ROLLATOR) Medication Administration: Independent Home Management (perform basic housework or laundry): Independent Manage your own finances?: yes Primary transportation is: family /  friends GAMES DEVELOPER) Concerns about vision?: no *vision screening is required for WTM* Concerns about hearing?: no Uses hearing aids?: (!) yes  Fall Screening Falls in the past year?: 0 Number of falls in past year: 0 Was there an injury with Fall?: 0 Fall Risk Category Calculator: 0 Patient Fall Risk Level: Low Fall Risk  Fall Risk Patient at Risk for Falls Due to: Impaired balance/gait Fall risk Follow up: Falls evaluation completed; Education provided  Home and Transportation Safety: All rugs have non-skid backing?: N/A, no rugs All stairs or steps have railings?: N/A, no stairs Grab bars in the bathtub or shower?: yes Have non-skid surface in bathtub or shower?: yes Good home lighting?: yes Regular seat belt use?: yes Hospital stays in the last year:: (!) yes How many hospital stays:: 1 Reason: A-FIB  Cognitive Assessment Difficulty concentrating, remembering, or making decisions? : no Will 6CIT or Mini Cog be Completed: no 6CIT or Mini Cog Declined: patient alert, oriented, able to answer questions appropriately and recall recent events  Advance Directives (For Healthcare) Does Patient Have a Medical Advance Directive?: No Would patient like information on creating a medical advance directive?: No - Patient declined  Reviewed/Updated  Reviewed/Updated: Reviewed All (Medical, Surgical, Family, Medications, Allergies, Care Teams, Patient Goals)    Allergies (verified) Iodinated contrast media   Current Medications (verified) Outpatient Encounter Medications as of 12/31/2023  Medication Sig   acetaminophen  (TYLENOL ) 325 MG tablet Take 2 tablets (650 mg total) by mouth every 6 (six) hours as needed for mild pain (pain score 1-3) or headache.   Cholecalciferol  (VITAMIN D ) 2000 units tablet Take 1 tablet (2,000 Units total) by mouth daily.   diltiazem  (CARDIZEM  CD) 180 MG 24 hr capsule Take 1 capsule (180 mg total) by mouth daily.   dorzolamidel-timolol  (COSOPT )  22.3-6.8 MG/ML SOLN ophthalmic solution Place 1 drop into both eyes in  the morning and at bedtime.   fluticasone  (FLONASE ) 50 MCG/ACT nasal spray Spray one spray into each nostril twice a day   furosemide  (LASIX ) 40 MG tablet Take 1 tablet (40 mg total) by mouth daily.   LUMIGAN 0.01 % SOLN Place 1 drop into both eyes at bedtime.   metoprolol  tartrate (LOPRESSOR ) 100 MG tablet Take 1 tablet (100 mg total) by mouth 2 (two) times daily.   pantoprazole  (PROTONIX ) 40 MG tablet Take 1 tablet (40 mg total) by mouth daily.   warfarin (COUMADIN ) 5 MG tablet TAKE 1 TO 2 TABLETS BY MOUTH DAILY AS DIRECTED BY COUMADIN  CLINIC   No facility-administered encounter medications on file as of 12/31/2023.    History: Past Medical History:  Diagnosis Date   Asthma    years ago   Chronic atrial fibrillation (HCC)    Dilated cardiomyopathy (HCC)    history of this, now resolved   Dysphagia    Hx   Fracture of tibial plateau 05/21/2015   GERD (gastroesophageal reflux disease)    GI bleed    Hemorrhoid 04/2014   bleeding   History of blood transfusion    Hypertension    Microcytic anemia    Nonsustained ventricular tachycardia (HCC)    Osteoporosis    Protein in urine    RBBB (right bundle branch block)    Rheumatic mitral valve disease    Past Surgical History:  Procedure Laterality Date   CHOLECYSTECTOMY  2005   COLONOSCOPY     COLONOSCOPY W/ POLYPECTOMY     EYE SURGERY  04/2014   HEMICOLECTOMY  2008   forpolyps   MITOMYCIN  C APPLICATION Right 05/17/2014   Procedure: MITOMYCIN  C APPLICATION;  Surgeon: Gaither Quan, MD;  Location: [facility] OR;  Service: Ophthalmology;  Laterality: Right;   MITRAL VALVE REPLACEMENT  1987   St Jude mechanical valve   RIGHT/LEFT HEART CATH AND CORONARY ANGIOGRAPHY N/A 04/02/2021   Procedure: RIGHT/LEFT HEART CATH AND CORONARY ANGIOGRAPHY;  Surgeon: Cherrie Toribio SAUNDERS, MD;  Location: [facility] INVASIVE CV LAB;  Service: Cardiovascular;  Laterality: N/A;   TOTAL HIP  ARTHROPLASTY Right 04/08/2022   Procedure: TOTAL HIP ARTHROPLASTY;  Surgeon: Beverley Evalene BIRCH, MD;  Location: [facility] OR;  Service: Orthopedics;  Laterality: Right;   TRABECULECTOMY Right 05/17/2014   Procedure: TRABECULECTOMY WITH MITOMYCIN  RIGHT EYE;  Surgeon: Gaither Quan, MD;  Location: [facility] OR;  Service: Ophthalmology;  Laterality: Right;   TUBAL LIGATION     US  ECHOCARDIOGRAPHY  02/2008, 03/2006, 06/2004   Family History  Problem Relation Age of Onset   Cancer Father        prostate cancer   Hypertension Mother    Stroke Mother        CVA x 2   Cancer Sister        in the Bladder   Endometriosis Daughter    Stroke Sister    Blindness Sister    Hypertension Sister    Thyroid  disease Daughter    Hypertension Daughter    Hypertension Daughter    Hypertension Daughter    Liver disease Daughter    Colon cancer Neg Hx    Breast cancer Neg Hx    Social History   Occupational History   Occupation: Retired  Tobacco Use   Smoking status: Former    Current packs/day: 0.00    Average packs/day: 1.5 packs/day for 15.0 years (22.5 ttl pk-yrs)    Types: Cigarettes    Start date: 24    Quit date:  1987    Years since quitting: 38.9   Smokeless tobacco: Never   Tobacco comments:    quit 1987  Vaping Use   Vaping status: Never Used  Substance and Sexual Activity   Alcohol use: No   Drug use: No   Sexual activity: Not Currently   Tobacco Counseling Counseling given: Not Answered Tobacco comments: quit 1987  SDOH Screenings   Food Insecurity: No Food Insecurity (12/31/2023)  Housing: Low Risk (12/31/2023)  Transportation Needs: No Transportation Needs (12/31/2023)  Utilities: Not At Risk (12/31/2023)  Alcohol Screen: Low Risk (12/31/2023)  Depression (PHQ2-9): Low Risk (12/31/2023)  Financial Resource Strain: Low Risk (12/31/2023)  Physical Activity: Inactive (12/31/2023)  Social Connections: Unknown (12/31/2023)  Stress: No Stress Concern Present (12/31/2023)  Tobacco Use:  Medium Risk (12/31/2023)  Health Literacy: Adequate Health Literacy (12/31/2023)   See flowsheets for full screening details  Depression Screen PHQ 2 & 9 Depression Scale- Over the past 2 weeks, how often have you been bothered by any of the following problems? Little interest or pleasure in doing things: 0 Feeling down, depressed, or hopeless (PHQ Adolescent also includes...irritable): 0 PHQ-2 Total Score: 0 Trouble falling or staying asleep, or sleeping too much: 0 Feeling tired or having little energy: 0 Poor appetite or overeating (PHQ Adolescent also includes...weight loss): 0 Feeling bad about yourself - or that you are a failure or have let yourself or your family down: 0 Trouble concentrating on things, such as reading the newspaper or watching television (PHQ Adolescent also includes...like school work): 0 Moving or speaking so slowly that other people could have noticed. Or the opposite - being so fidgety or restless that you have been moving around a lot more than usual: 0 Thoughts that you would be better off dead, or of hurting yourself in some way: 0 PHQ-9 Total Score: 0 If you checked off any problems, how difficult have these problems made it for you to do your work, take care of things at home, or get along with other people?: Not difficult at all  Depression Treatment Depression Interventions/Treatment : EYV7-0 Score <4 Follow-up Not Indicated     Goals Addressed             This Visit's Progress    12/31/2023: To maintain the best that I can.               Objective:    Today's Vitals   12/31/23 0834  Weight: 147 lb (66.7 kg)  Height: 5' 8 (1.727 m)  PainSc: 0-No pain   Body mass index is 22.35 kg/m.  Hearing/Vision screen Hearing Screening - Comments:: Wears hearing aids. Vision Screening - Comments:: Adequate vision with the use of eyeglasses. Immunizations and Health Maintenance Health Maintenance  Topic Date Due   DTaP/Tdap/Td (3 - Tdap)  08/27/2017   Influenza Vaccine  08/21/2023   COVID-19 Vaccine (2 - 2025-26 season) 09/21/2023   Medicare Annual Wellness (AWV)  12/30/2024   Pneumococcal Vaccine: 50+ Years  Completed   Bone Density Scan  Completed   Zoster Vaccines- Shingrix  Completed   Meningococcal B Vaccine  Aged Out        Assessment/Plan:  This is a routine wellness examination for [name].  Patient Care Team: [name] Camie CROME, MD as PCP - General (Family Medicine) Waddell Danelle ORN, MD as PCP - Electrophysiology (Cardiology) Waddell Danelle ORN, MD as PCP - Cardiology (Cardiology) Burnette Fallow, MD as Consulting Physician (Gastroenterology) Waylan Cain, MD as Consulting Physician (Ophthalmology) Prescilla,  Curtis, MD as Consulting Physician (Nephrology) Gaynel Delon CROME, DPM as Consulting Physician (Podiatry)  I have personally reviewed and noted the following in the patients chart:   Medical and social history Use of alcohol, tobacco or illicit drugs  Current medications and supplements including opioid prescriptions. Functional ability and status Nutritional status Physical activity Advanced directives List of other physicians Hospitalizations, surgeries, and ER visits in previous 12 months Vitals Screenings to include cognitive, depression, and falls Referrals and appointments  No orders of the defined types were placed in this encounter.  In addition, I have reviewed and discussed with patient certain preventive protocols, quality metrics, and best practice recommendations. A written personalized care plan for preventive services as well as general preventive health recommendations were provided to patient.   Roz LOISE Fuller, LPN   [id number]/[id number]   Return in about 1 year (around 12/30/2024) for Medicare wellness.  After Visit Summary: (MyChart) Due to this being a telephonic visit, the after visit summary with patients personalized plan was offered to patient via MyChart   NURSE NOTES: HM  Addressed: Vaccines Due: Flu and Dtap. Message sent to ADM to contact patient to schedule an appointment with PCP.

## 2024-01-01 ENCOUNTER — Ambulatory Visit: Payer: Self-pay | Admitting: Family Medicine

## 2024-01-01 NOTE — Progress Notes (Deleted)
° ° °  SUBJECTIVE:   CHIEF COMPLAINT / HPI:   Sick symptoms Started  ***  PERTINENT  PMH / PSH: Reviewed A fib, CHF, GERD  OBJECTIVE:   There were no vitals taken for this visit.  General: ***-appearing, no acute distress. HEENT: normocephalic, PERRLA, EOM grossly intact, MMM, bilateral TM visualized without erythema or bulging. Cardio: Regular rate, *** rhythm, no murmurs on exam. Pulm: Clear, no wheezing, no crackles. No increased work of breathing. Abdominal: bowel sounds present, soft, non-tender, non-distended. No HSM. Extremities: no peripheral edema. Moves all extremities equally. Neuro: Alert and oriented x3, speech normal in content, no facial asymmetry, strength intact and equal bilaterally in UE and LE, pupils equal and reactive to light.  Psych:  Cognition and judgment appear intact. Alert, communicative, and cooperative with normal attention span and concentration. No apparent delusions, illusions, hallucinations    ASSESSMENT/PLAN:   Assessment & Plan      Lauraine Norse, DO [facility]

## 2024-01-08 ENCOUNTER — Ambulatory Visit: Attending: Internal Medicine | Admitting: Pharmacist

## 2024-01-08 DIAGNOSIS — Z952 Presence of prosthetic heart valve: Secondary | ICD-10-CM | POA: Diagnosis not present

## 2024-01-08 DIAGNOSIS — I4891 Unspecified atrial fibrillation: Secondary | ICD-10-CM

## 2024-01-08 LAB — POCT INR: INR: 1.8 — AB (ref 2.0–3.0)

## 2024-01-08 NOTE — Progress Notes (Signed)
 Description   INR-1.8; Take a whole tablet today and tomorrow and then continue taking Warfarin 1/2 tablet every day except for 1 tablet on Sunday, Tuesday and Thursday.  Reduce how many greens you are eating Recheck INR in 2 weeks.   Remain consistent with your 2 serving of greens per week and your 1 ensure a week. Coumadin  Clinic [phone].

## 2024-01-08 NOTE — Patient Instructions (Signed)
 Description   INR-1.8; Take a whole tablet today and tomorrow and then continue taking Warfarin 1/2 tablet every day except for 1 tablet on Sunday, Tuesday and Thursday.  Reduce how many greens you are eating Recheck INR in 2 weeks.   Remain consistent with your 2 serving of greens per week and your 1 ensure a week. Coumadin  Clinic [phone].

## 2024-01-19 ENCOUNTER — Ambulatory Visit: Admitting: Family Medicine

## 2024-01-22 ENCOUNTER — Ambulatory Visit: Attending: Internal Medicine | Admitting: Pharmacist

## 2024-01-22 DIAGNOSIS — Z952 Presence of prosthetic heart valve: Secondary | ICD-10-CM

## 2024-01-22 DIAGNOSIS — Z7901 Long term (current) use of anticoagulants: Secondary | ICD-10-CM | POA: Diagnosis not present

## 2024-01-22 LAB — POCT INR: INR: 1.3 — AB (ref 2.0–3.0)

## 2024-01-22 NOTE — Progress Notes (Signed)
 Description   INR-1.3; Take 1.5 tablets today and then increase to a whole tablet Sunday, Tuesday, Thursday, and Saturday, 1/2 tablet Monday, Wednesday, Friday.   Reduce how much cabbage you are eating Recheck INR in 2 weeks.   Remain consistent with your 2 serving of greens per week and your 1 ensure a week. Coumadin  Clinic [phone].

## 2024-01-22 NOTE — Patient Instructions (Signed)
 Description   INR-1.3; Take 1.5 tablets today and then increase to a whole tablet Sunday, Tuesday, Thursday, and Saturday, 1/2 tablet Monday, Wednesday, Friday.   Reduce how much cabbage you are eating Recheck INR in 2 weeks.   Remain consistent with your 2 serving of greens per week and your 1 ensure a week. Coumadin  Clinic [phone].

## 2024-02-05 ENCOUNTER — Ambulatory Visit

## 2024-02-11 ENCOUNTER — Ambulatory Visit

## 2024-02-11 DIAGNOSIS — Z5181 Encounter for therapeutic drug level monitoring: Secondary | ICD-10-CM

## 2024-02-11 DIAGNOSIS — Z952 Presence of prosthetic heart valve: Secondary | ICD-10-CM | POA: Diagnosis not present

## 2024-02-11 LAB — POCT INR: INR: 1.6 — AB (ref 2.0–3.0)

## 2024-02-11 NOTE — Progress Notes (Signed)
 INR 1.6 Take 2 tablets today and then continue a whole tablet Sunday, Tuesday, Thursday, and Saturday, 1/2 tablet Monday, Wednesday, Friday.   Reduce how much cabbage you are eating Recheck INR in 3 weeks.   Remain consistent with your 2 serving of greens per week and your 1 ensure a week. Coumadin  Clinic [phone].

## 2024-02-11 NOTE — Patient Instructions (Signed)
 Take 2 tablets today and then continue a whole tablet Sunday, Tuesday, Thursday, and Saturday, 1/2 tablet Monday, Wednesday, Friday.   Reduce how much cabbage you are eating Recheck INR in 3 weeks.   Remain consistent with your 2 serving of greens per week and your 1 ensure a week. Coumadin  Clinic [phone].

## 2024-02-23 ENCOUNTER — Encounter (HOSPITAL_COMMUNITY): Payer: Self-pay | Admitting: Family Medicine

## 2024-02-23 ENCOUNTER — Inpatient Hospital Stay (HOSPITAL_COMMUNITY): Admitting: Anesthesiology

## 2024-02-23 ENCOUNTER — Encounter (HOSPITAL_COMMUNITY): Admission: EM | Payer: Self-pay | Source: Home / Self Care | Attending: Family Medicine

## 2024-02-23 ENCOUNTER — Emergency Department (HOSPITAL_COMMUNITY)

## 2024-02-23 ENCOUNTER — Other Ambulatory Visit: Payer: Self-pay

## 2024-02-23 ENCOUNTER — Inpatient Hospital Stay (HOSPITAL_COMMUNITY): Admission: EM | Admit: 2024-02-23 | Source: Home / Self Care | Attending: Family Medicine | Admitting: Family Medicine

## 2024-02-23 ENCOUNTER — Inpatient Hospital Stay (HOSPITAL_COMMUNITY)

## 2024-02-23 DIAGNOSIS — I272 Pulmonary hypertension, unspecified: Secondary | ICD-10-CM

## 2024-02-23 DIAGNOSIS — I480 Paroxysmal atrial fibrillation: Secondary | ICD-10-CM | POA: Diagnosis not present

## 2024-02-23 DIAGNOSIS — N3000 Acute cystitis without hematuria: Secondary | ICD-10-CM

## 2024-02-23 DIAGNOSIS — R4182 Altered mental status, unspecified: Secondary | ICD-10-CM | POA: Insufficient documentation

## 2024-02-23 DIAGNOSIS — N200 Calculus of kidney: Secondary | ICD-10-CM

## 2024-02-23 DIAGNOSIS — R471 Dysarthria and anarthria: Secondary | ICD-10-CM | POA: Insufficient documentation

## 2024-02-23 DIAGNOSIS — N179 Acute kidney failure, unspecified: Secondary | ICD-10-CM | POA: Diagnosis present

## 2024-02-23 DIAGNOSIS — I5020 Unspecified systolic (congestive) heart failure: Secondary | ICD-10-CM | POA: Diagnosis not present

## 2024-02-23 DIAGNOSIS — N189 Chronic kidney disease, unspecified: Secondary | ICD-10-CM | POA: Diagnosis present

## 2024-02-23 DIAGNOSIS — E872 Acidosis, unspecified: Secondary | ICD-10-CM | POA: Diagnosis not present

## 2024-02-23 DIAGNOSIS — W19XXXA Unspecified fall, initial encounter: Secondary | ICD-10-CM | POA: Diagnosis not present

## 2024-02-23 DIAGNOSIS — A419 Sepsis, unspecified organism: Secondary | ICD-10-CM | POA: Diagnosis not present

## 2024-02-23 DIAGNOSIS — E875 Hyperkalemia: Secondary | ICD-10-CM | POA: Diagnosis not present

## 2024-02-23 DIAGNOSIS — I350 Nonrheumatic aortic (valve) stenosis: Secondary | ICD-10-CM

## 2024-02-23 DIAGNOSIS — Z789 Other specified health status: Secondary | ICD-10-CM

## 2024-02-23 DIAGNOSIS — I352 Nonrheumatic aortic (valve) stenosis with insufficiency: Secondary | ICD-10-CM | POA: Diagnosis not present

## 2024-02-23 DIAGNOSIS — N3090 Cystitis, unspecified without hematuria: Secondary | ICD-10-CM | POA: Insufficient documentation

## 2024-02-23 DIAGNOSIS — I4891 Unspecified atrial fibrillation: Secondary | ICD-10-CM | POA: Diagnosis present

## 2024-02-23 DIAGNOSIS — J810 Acute pulmonary edema: Secondary | ICD-10-CM

## 2024-02-23 DIAGNOSIS — I50810 Right heart failure, unspecified: Secondary | ICD-10-CM

## 2024-02-23 DIAGNOSIS — R570 Cardiogenic shock: Secondary | ICD-10-CM

## 2024-02-23 DIAGNOSIS — Z952 Presence of prosthetic heart valve: Secondary | ICD-10-CM

## 2024-02-23 DIAGNOSIS — I959 Hypotension, unspecified: Secondary | ICD-10-CM | POA: Diagnosis present

## 2024-02-23 DIAGNOSIS — I5023 Acute on chronic systolic (congestive) heart failure: Secondary | ICD-10-CM | POA: Diagnosis present

## 2024-02-23 DIAGNOSIS — I050 Rheumatic mitral stenosis: Secondary | ICD-10-CM | POA: Diagnosis present

## 2024-02-23 DIAGNOSIS — Z7901 Long term (current) use of anticoagulants: Secondary | ICD-10-CM

## 2024-02-23 DIAGNOSIS — I5021 Acute systolic (congestive) heart failure: Secondary | ICD-10-CM | POA: Diagnosis not present

## 2024-02-23 DIAGNOSIS — K7460 Unspecified cirrhosis of liver: Secondary | ICD-10-CM

## 2024-02-23 LAB — CBC WITH DIFFERENTIAL/PLATELET
Abs Immature Granulocytes: 0.05 10*3/uL (ref 0.00–0.07)
Basophils Absolute: 0 10*3/uL (ref 0.0–0.1)
Basophils Relative: 0 %
Eosinophils Absolute: 0 10*3/uL (ref 0.0–0.5)
Eosinophils Relative: 0 %
HCT: 35.9 % — ABNORMAL LOW (ref 36.0–46.0)
Hemoglobin: 10.1 g/dL — ABNORMAL LOW (ref 12.0–15.0)
Immature Granulocytes: 1 %
Lymphocytes Relative: 9 %
Lymphs Abs: 0.8 10*3/uL (ref 0.7–4.0)
MCH: 25.8 pg — ABNORMAL LOW (ref 26.0–34.0)
MCHC: 28.1 g/dL — ABNORMAL LOW (ref 30.0–36.0)
MCV: 91.6 fL (ref 80.0–100.0)
Monocytes Absolute: 0.3 10*3/uL (ref 0.1–1.0)
Monocytes Relative: 4 %
Neutro Abs: 7.1 10*3/uL (ref 1.7–7.7)
Neutrophils Relative %: 86 %
Platelets: 141 10*3/uL — ABNORMAL LOW (ref 150–400)
RBC: 3.92 MIL/uL (ref 3.87–5.11)
RDW: 20.2 % — ABNORMAL HIGH (ref 11.5–15.5)
WBC: 8.3 10*3/uL (ref 4.0–10.5)
nRBC: 0 % (ref 0.0–0.2)

## 2024-02-23 LAB — I-STAT CHEM 8, ED
BUN: 55 mg/dL — ABNORMAL HIGH (ref 8–23)
Calcium, Ion: 1.08 mmol/L — ABNORMAL LOW (ref 1.15–1.40)
Chloride: 116 mmol/L — ABNORMAL HIGH (ref 98–111)
Creatinine, Ser: 3.3 mg/dL — ABNORMAL HIGH (ref 0.44–1.00)
Glucose, Bld: 97 mg/dL (ref 70–99)
HCT: 35 % — ABNORMAL LOW (ref 36.0–46.0)
Hemoglobin: 11.9 g/dL — ABNORMAL LOW (ref 12.0–15.0)
Potassium: 5.2 mmol/L — ABNORMAL HIGH (ref 3.5–5.1)
Sodium: 146 mmol/L — ABNORMAL HIGH (ref 135–145)
TCO2: 19 mmol/L — ABNORMAL LOW (ref 22–32)

## 2024-02-23 LAB — COMPREHENSIVE METABOLIC PANEL WITH GFR
ALT: <5 U/L (ref 0–44)
AST: 26 U/L (ref 15–41)
Albumin: 3.3 g/dL — ABNORMAL LOW (ref 3.5–5.0)
Alkaline Phosphatase: 51 U/L (ref 38–126)
Anion gap: 16 — ABNORMAL HIGH (ref 5–15)
BUN: 57 mg/dL — ABNORMAL HIGH (ref 8–23)
CO2: 18 mmol/L — ABNORMAL LOW (ref 22–32)
Calcium: 9.1 mg/dL (ref 8.9–10.3)
Chloride: 109 mmol/L (ref 98–111)
Creatinine, Ser: 3.07 mg/dL — ABNORMAL HIGH (ref 0.44–1.00)
GFR, Estimated: 14 mL/min — ABNORMAL LOW
Glucose, Bld: 96 mg/dL (ref 70–99)
Potassium: 5.3 mmol/L — ABNORMAL HIGH (ref 3.5–5.1)
Sodium: 143 mmol/L (ref 135–145)
Total Bilirubin: 1 mg/dL (ref 0.0–1.2)
Total Protein: 9 g/dL — ABNORMAL HIGH (ref 6.5–8.1)

## 2024-02-23 LAB — MRSA NEXT GEN BY PCR, NASAL: MRSA by PCR Next Gen: NOT DETECTED

## 2024-02-23 LAB — PRO BRAIN NATRIURETIC PEPTIDE
Pro Brain Natriuretic Peptide: >35000 pg/mL — ABNORMAL HIGH
Pro Brain Natriuretic Peptide: >35000 pg/mL — ABNORMAL HIGH

## 2024-02-23 LAB — BASIC METABOLIC PANEL WITH GFR
Anion gap: 14 (ref 5–15)
BUN: 62 mg/dL — ABNORMAL HIGH (ref 8–23)
CO2: 21 mmol/L — ABNORMAL LOW (ref 22–32)
Calcium: 9.2 mg/dL (ref 8.9–10.3)
Chloride: 109 mmol/L (ref 98–111)
Creatinine, Ser: 3.33 mg/dL — ABNORMAL HIGH (ref 0.44–1.00)
GFR, Estimated: 13 mL/min — ABNORMAL LOW
Glucose, Bld: 85 mg/dL (ref 70–99)
Potassium: 5.6 mmol/L — ABNORMAL HIGH (ref 3.5–5.1)
Sodium: 144 mmol/L (ref 135–145)

## 2024-02-23 LAB — BLOOD GAS, VENOUS
Acid-base deficit: 3.3 mmol/L — ABNORMAL HIGH (ref 0.0–2.0)
Acid-base deficit: 3.9 mmol/L — ABNORMAL HIGH (ref 0.0–2.0)
Bicarbonate: 22.1 mmol/L (ref 20.0–28.0)
Bicarbonate: 22.2 mmol/L (ref 20.0–28.0)
Drawn by: 5286
O2 Saturation: 90.6 %
O2 Saturation: 68.4 %
Patient temperature: 36.5
Patient temperature: 36.4
pCO2, Ven: 39 mmHg — ABNORMAL LOW (ref 44–60)
pCO2, Ven: 42 mmHg — ABNORMAL LOW (ref 44–60)
pH, Ven: 7.36 (ref 7.25–7.43)
pH, Ven: 7.33 (ref 7.25–7.43)
pO2, Ven: 61 mmHg — ABNORMAL HIGH (ref 32–45)
pO2, Ven: 42 mmHg (ref 32–45)

## 2024-02-23 LAB — GLUCOSE, CAPILLARY: Glucose-Capillary: 90 mg/dL (ref 70–99)

## 2024-02-23 LAB — TROPONIN T, HIGH SENSITIVITY
Troponin T High Sensitivity: 63 ng/L — ABNORMAL HIGH (ref 0–19)
Troponin T High Sensitivity: 70 ng/L — ABNORMAL HIGH (ref 0–19)

## 2024-02-23 LAB — CK: Total CK: 140 U/L (ref 38–234)

## 2024-02-23 LAB — MAGNESIUM: Magnesium: 2.6 mg/dL — ABNORMAL HIGH (ref 1.7–2.4)

## 2024-02-23 LAB — I-STAT CG4 LACTIC ACID, ED: Lactic Acid, Venous: 2.2 mmol/L (ref 0.5–1.9)

## 2024-02-23 LAB — RESP PANEL BY RT-PCR (RSV, FLU A&B, COVID)  RVPGX2
Influenza A by PCR: NEGATIVE
Influenza B by PCR: NEGATIVE
Resp Syncytial Virus by PCR: NEGATIVE
SARS Coronavirus 2 by RT PCR: NEGATIVE

## 2024-02-23 LAB — PROTIME-INR
INR: 1.4 — ABNORMAL HIGH (ref 0.8–1.2)
Prothrombin Time: 18.3 s — ABNORMAL HIGH (ref 11.4–15.2)

## 2024-02-23 MED ORDER — FUROSEMIDE 10 MG/ML IJ SOLN
120.0000 mg | Freq: Once | INTRAVENOUS | Status: AC
  Administered 2024-02-24: 120 mg via INTRAVENOUS
  Filled 2024-02-23: qty 120

## 2024-02-23 MED ORDER — SODIUM CHLORIDE 0.9 % IV SOLN: 2.0000 g | INTRAVENOUS | Status: DC

## 2024-02-23 MED ORDER — FENTANYL CITRATE (PF) 50 MCG/ML IJ SOSY
25.0000 ug | PREFILLED_SYRINGE | Freq: Once | INTRAMUSCULAR | Status: AC
  Administered 2024-02-23: 25 ug via INTRAVENOUS
  Filled 2024-02-23: qty 1

## 2024-02-23 MED ORDER — OXYCODONE HCL 5 MG/5ML PO SOLN: 5.0000 mg | Freq: Once | ORAL | Status: DC | PRN

## 2024-02-23 MED ORDER — SODIUM CHLORIDE 0.9 % IV BOLUS
250.0000 mL | Freq: Once | INTRAVENOUS | Status: AC
  Administered 2024-02-23: 250 mL via INTRAVENOUS

## 2024-02-23 MED ORDER — PROPOFOL 10 MG/ML IV BOLUS
INTRAVENOUS | Status: AC
  Filled 2024-02-23: qty 20

## 2024-02-23 MED ORDER — HEPARIN (PORCINE) 25000 UT/250ML-% IV SOLN
1450.0000 [IU]/h | INTRAVENOUS | Status: AC
  Administered 2024-02-23: 1000 [IU]/h via INTRAVENOUS
  Administered 2024-02-24: 1150 [IU]/h via INTRAVENOUS
  Administered 2024-02-25: 1300 [IU]/h via INTRAVENOUS
  Administered 2024-02-26: 1450 [IU]/h via INTRAVENOUS
  Filled 2024-02-23 (×4): qty 250

## 2024-02-23 MED ORDER — ACETAMINOPHEN 10 MG/ML IV SOLN
1000.0000 mg | Freq: Four times a day (QID) | INTRAVENOUS | Status: AC
  Administered 2024-02-23 – 2024-02-24 (×3): 1000 mg via INTRAVENOUS
  Filled 2024-02-23 (×3): qty 100

## 2024-02-23 MED ORDER — FENTANYL CITRATE (PF) 100 MCG/2ML IJ SOLN: 25.0000 ug | INTRAMUSCULAR | Status: DC | PRN

## 2024-02-23 MED ORDER — FENTANYL CITRATE (PF) 100 MCG/2ML IJ SOLN
INTRAMUSCULAR | Status: AC
  Filled 2024-02-23: qty 2

## 2024-02-23 MED ORDER — METRONIDAZOLE 500 MG/100ML IV SOLN: 500.0000 mg | Freq: Two times a day (BID) | INTRAVENOUS | Status: DC

## 2024-02-23 MED ORDER — SODIUM CHLORIDE 0.9 % IR SOLN
Status: DC | PRN
  Administered 2024-02-23: 3000 mL via INTRAVESICAL

## 2024-02-23 MED ORDER — SODIUM CHLORIDE 0.9 % IV BOLUS
500.0000 mL | Freq: Once | INTRAVENOUS | Status: AC
  Administered 2024-02-23: 500 mL via INTRAVENOUS

## 2024-02-23 MED ORDER — VANCOMYCIN HCL 1500 MG/300ML IV SOLN
1500.0000 mg | Freq: Once | INTRAVENOUS | Status: AC
  Administered 2024-02-23: 1500 mg via INTRAVENOUS
  Filled 2024-02-23: qty 300

## 2024-02-23 MED ORDER — FUROSEMIDE 10 MG/ML IJ SOLN
20.0000 mg | Freq: Once | INTRAMUSCULAR | Status: AC
  Administered 2024-02-23: 20 mg via INTRAVENOUS
  Filled 2024-02-23: qty 2

## 2024-02-23 MED ORDER — HEPARIN BOLUS VIA INFUSION
3000.0000 [IU] | Freq: Once | INTRAVENOUS | Status: AC
  Administered 2024-02-23: 3000 [IU] via INTRAVENOUS
  Filled 2024-02-23: qty 3000

## 2024-02-23 MED ORDER — HEPARIN BOLUS VIA INFUSION
4000.0000 [IU] | Freq: Once | INTRAVENOUS | Status: DC
  Filled 2024-02-23: qty 4000

## 2024-02-23 MED ORDER — SODIUM ZIRCONIUM CYCLOSILICATE 5 G PO PACK
5.0000 g | PACK | Freq: Every day | ORAL | Status: DC
  Filled 2024-02-23: qty 1

## 2024-02-23 MED ORDER — VANCOMYCIN HCL 750 MG/150ML IV SOLN: 750.0000 mg | INTRAVENOUS | Status: DC

## 2024-02-23 MED ORDER — OXYCODONE HCL 5 MG PO TABS: 5.0000 mg | ORAL_TABLET | Freq: Once | ORAL | Status: DC | PRN

## 2024-02-23 MED ORDER — HEPARIN (PORCINE) 25000 UT/250ML-% IV SOLN
1100.0000 [IU]/h | INTRAVENOUS | Status: DC
  Filled 2024-02-23: qty 250

## 2024-02-23 MED ORDER — SODIUM CHLORIDE 0.9 % IV BOLUS: 250.0000 mL | Freq: Once | INTRAVENOUS | Status: DC

## 2024-02-23 MED ORDER — VASOPRESSIN 20 UNIT/ML IV SOLN
INTRAVENOUS | Status: AC
  Filled 2024-02-23: qty 1

## 2024-02-23 MED ORDER — PIPERACILLIN-TAZOBACTAM IN DEX 2-0.25 GM/50ML IV SOLN
2.2500 g | Freq: Three times a day (TID) | INTRAVENOUS | Status: DC
  Administered 2024-02-23 – 2024-02-25 (×7): 2.25 g via INTRAVENOUS
  Filled 2024-02-23 (×8): qty 50

## 2024-02-23 MED ORDER — SODIUM CHLORIDE 0.9 % IV SOLN: INTRAVENOUS | Status: DC | PRN

## 2024-02-23 MED ORDER — PROPOFOL 10 MG/ML IV BOLUS
INTRAVENOUS | Status: DC | PRN
  Administered 2024-02-23: 10 mg via INTRAVENOUS
  Administered 2024-02-23: 25 ug/kg/min via INTRAVENOUS
  Administered 2024-02-23 (×3): 10 mg via INTRAVENOUS

## 2024-02-23 MED ORDER — FENTANYL CITRATE (PF) 50 MCG/ML IJ SOSY
50.0000 ug | PREFILLED_SYRINGE | Freq: Once | INTRAMUSCULAR | Status: AC
  Administered 2024-02-23: 50 ug via INTRAVENOUS
  Filled 2024-02-23: qty 1

## 2024-02-23 MED ORDER — DILTIAZEM HCL-DEXTROSE 125-5 MG/125ML-% IV SOLN (PREMIX)
5.0000 mg/h | INTRAVENOUS | Status: DC
  Administered 2024-02-23: 5 mg/h via INTRAVENOUS
  Filled 2024-02-23: qty 125

## 2024-02-23 MED ORDER — FUROSEMIDE 10 MG/ML IJ SOLN
40.0000 mg | Freq: Once | INTRAMUSCULAR | Status: AC
  Administered 2024-02-23: 40 mg via INTRAVENOUS
  Filled 2024-02-23: qty 4

## 2024-02-23 MED ORDER — IOPAMIDOL (ISOVUE-370) INJECTION 76%
INTRAVENOUS | Status: DC | PRN
  Administered 2024-02-23: 10 mL

## 2024-02-23 MED ORDER — LIDOCAINE HCL URETHRAL/MUCOSAL 2 % EX GEL
CUTANEOUS | Status: AC
  Filled 2024-02-23: qty 11

## 2024-02-23 NOTE — ED Notes (Signed)
 Bair Hugger applied

## 2024-02-23 NOTE — Progress Notes (Signed)
 89 Spoke with the pt's daughter [name] via phone.

## 2024-02-23 NOTE — Progress Notes (Addendum)
 Asked by hospitalist team to assist with patient's care. On IV dilt @15  with soft pressures and developing respiratory distress necessitating BiPAP. Receiving fluids. CXR showing pulmonary edema. On my exam JVP elevated to the tragus. HRs ~110 AF with RVR. BNP>35K. Even if patient was hypovolemic with sepsis earlier in admission at this time is developing signs of volume repletion and volume overload. Recommend diuresis given respiratory distress. Hold diltiazem . Dilt will last several hours in system. If HRs start increasing again past 120-130 then can start IV amio but would not initiate just yet given additional negative inotropy risk with IV amio and to allow dilt to washout and her respiratory status to stabilize. Recommend repeating labs including lactate.

## 2024-02-23 NOTE — ED Notes (Signed)
"  Only able to get one set of cultures   "

## 2024-02-23 NOTE — Hospital Course (Signed)
 [name] is a 89 y.o. female who was admitted to the [facility] Medicine Teaching Service at [facility] for Sepsis. Hospital course is outlined below:  Sepsis Pt was admitted after a fall led to a focal calceal rupture of her L kidney. A L ureteral stent was placed, during which procedure the bladder also appeared grossly infected. Proteus Mirabilis with multiple resistances was eventually cultured and appropriate antibiotic medications were given.   Heart Failure Complicated by CKD Respiratory distress from fluid overload d/t heart failure was the most vital concern. Pt required extensive diuresis and BiPaP for stabilization. This was complicated by A.Fib, tachycardia, and hypotension with diltiazem  or amiodorone when used. The necessary low volume also led to a steady worsening of her kidney function during her admission. Palliative was consulted early on in the course.  Other conditions that were chronic and stable: ***  Issues for follow up: ***

## 2024-02-23 NOTE — Assessment & Plan Note (Addendum)
 Unwitnessed fall at home, patient on warfarin.  However, unclear compliance as of late.  INR 1.4, subtherapeutic, believe goal to be 2.5 - 3.5. .  Concern for rhabdomyolysis as unknown down time.  - CK pending  - will start hep drip per pharmacy  - Fall precautions - Pain: IV Tylenol  1000 mg every 6 hours scheduled, fentanyl  25 mcg once at 1630, assess for further need

## 2024-02-23 NOTE — Assessment & Plan Note (Signed)
 Vascular congestion and small plueral effusion seen on CXR.  - Pro BNP ordered - Strict I&Os - Daily weights - Echo

## 2024-02-23 NOTE — Discharge Instructions (Signed)
 DISCHARGE INSTRUCTIONS FOR KIDNEY STONE/URETERAL STENT   ACTIVITY:  1. No strenuous activity x 1week  2. No driving while on narcotic pain medications  3. Drink plenty of water   4. Continue to walk at home - you can still get blood clots when you are at home, so keep active, but don't over do it.  5. May return to work/school tomorrow or when you feel ready   BATHING:  1. You can shower and we recommend daily showers  2. You have a string coming from your urethra: The stent string is attached to your ureteral stent. Do not pull on this.   SIGNS/SYMPTOMS TO CALL:  Please call us  if you have a fever greater than 101.5, uncontrolled nausea/vomiting, uncontrolled pain, dizziness, unable to urinate, bloody urine, chest pain, shortness of breath, leg swelling, leg pain, redness around wound, drainage from wound, or any other concerns or questions.   You can reach us  at [phone].   FOLLOW-UP:  1. You will be scheduled for f/u with Dr. Cam

## 2024-02-23 NOTE — Plan of Care (Signed)
 Called patients daughter, [name], to discuss patients interval clinical picture. Verified patients name and DOB. Explaining concern for sepsis and her altered mental status. Discussed goals of care and what Ms. Sica would want. Daughter agreed that patient would not want to go through resuscitation efforts. Explained DNR status to daughter, who agreed that Ms. Stempel would want to be DNR. Changed code status to DNR.   CODE STATUS: DNR

## 2024-02-23 NOTE — Assessment & Plan Note (Addendum)
 Unclear etiology.  Differential includes sepsis, stroke, and arrhythmia as above.  CT head showed no acute intracranial abnormality, moderate chronic small vessel ischemia, and cerebral atrophy. - MRI once patient has improved clinically, unable to complete at this time due to severity of illness - Delirium precautions - Neurochecks every 4 hours - Bedside swallow evaluation - NPO pending BSE

## 2024-02-23 NOTE — ED Notes (Signed)
 CCMD called; Pt. Is on monitoring

## 2024-02-23 NOTE — Progress Notes (Signed)
 Pharmacy Antibiotic Note  [name] is a 89 y.o. female for which pharmacy has been consulted for vancomycin  dosing for sepsis.  SCr 3.3 - near baseline WBC 8.3; T 97.7; HR 60; RR 42  Plan: Zosyn  2.25 g q8h Vancomycin  1500 mg once then 750 mg q48hr (eAUC 523) unless change in renal function Monitor WBC, fever, renal function, cultures De-escalate when able Levels at steady state     Temp (24hrs), Avg:97.1 F (36.2 C), Min:96.4 F (35.8 C), Max:97.7 F (36.5 C)  Recent Labs  Lab 02/23/24 1139 02/23/24 1145  WBC 8.3  --   CREATININE 3.07* 3.30*    CrCl cannot be calculated (Unknown ideal weight.).    Allergies[1]  Microbiology results: Pending  Thank you for allowing pharmacy to be a part of this patients care.  Dorn Buttner, PharmD, BCPS 02/23/2024 4:30 PM ED Clinical Pharmacist -  [phone]       [1]  Allergies Allergen Reactions   Iodinated Contrast Media Rash    Pt stated in the past had broken out in a rash on back and itching.

## 2024-02-23 NOTE — ED Notes (Addendum)
 Xray at [facility], in progress. EDP present. Alert, airway patent, moaning in pain.

## 2024-02-23 NOTE — Progress Notes (Addendum)
 ANTICOAGULATION CONSULT NOTE  Pharmacy Consult for Heparin  Indication: atrial fibrillation/St Jude MVR  Allergies[1]  Patient Measurements:   Heparin  Dosing Weight: 68 kg  Vital Signs: Temp: 98 F (36.7 C) (02/03 2055) Temp Source: Oral (02/03 2055) BP: 96/59 (02/03 2232) Pulse Rate: 111 (02/03 2232)  Labs: Recent Labs    02/23/24 1139 02/23/24 1145 02/23/24 1734  HGB 10.1* 11.9*  --   HCT 35.9* 35.0*  --   PLT 141*  --   --   LABPROT 18.3*  --   --   INR 1.4*  --   --   CREATININE 3.07* 3.30* 3.33*  CKTOTAL  --   --  140    CrCl cannot be calculated (Unknown ideal weight.).   Medical History: Past Medical History:  Diagnosis Date   Asthma    years ago   Chronic atrial fibrillation (HCC)    Dilated cardiomyopathy (HCC)    history of this, now resolved   Dysphagia    Hx   Fracture of tibial plateau 05/21/2015   GERD (gastroesophageal reflux disease)    GI bleed    Hemorrhoid 04/2014   bleeding   History of blood transfusion    Hypertension    Microcytic anemia    Nonsustained ventricular tachycardia (HCC)    Osteoporosis    Protein in urine    RBBB (right bundle branch block)    Rheumatic mitral valve disease      Assessment: 82 yof with a history of CKD, AFib, St Jude MVR on warfarin. Heparin  per pharmacy consult placed for atrial fibrillation/St Jude MVR.  Patient is on warfarin PTA for St Jude MVR (INR goal 2.5-3.5). Per [facility] clinic note 2.5 mg MWF, 5 mg all other days   S/p cystoscopy and ureteral stent placement. Per Team, ok to restart heparin .  Goal of Therapy:  Heparin  level 0.3-0.7 units/ml Monitor platelets by anticoagulation protocol: Yes   Plan:  Give IV heparin  3000 units bolus x 1, then heparin  1000 units/hr Monitor daily heparin  level, CBC, signs/symptoms of bleeding    Jinnie Door, PharmD, BCPS, BCCP Clinical Pharmacist  Please check AMION for all [facility] Pharmacy phone numbers After 10:00 PM, call Main Pharmacy [phone]      [1]  Allergies Allergen Reactions   Iodinated Contrast Media Rash    Pt stated in the past had broken out in a rash on back and itching.

## 2024-02-23 NOTE — ED Triage Notes (Signed)
 Pt. BIB [facility] from home with c/o fall; Per [facility] it is unknown how long the pt. Has been on the ground; Per [facility] the pts. Daughter last talked to her over the phone around noon yesterday; The pt. Lives alone; Pt. Is A/O x3, disoriented to place; Pt. C/o hip, back, and rib pain. Per [facility] the daughter also stated that the pt. Has been hallucinating for about a week; [facility] administered 50mcgs of Fentanyl  and 250mls of NS. Pt. Has a 22G in R Hand.

## 2024-02-23 NOTE — Plan of Care (Signed)
 Paged Dr. Jeffrie, Cardiology, for concern of A fib with RVR / V tach. Per Dr Jeffrie, EKG showed wide complex tachycardia. He believes she is likely volume depleted and needs repletion. Recommending to continue IV dilt while NPO and replete volume - 500 cc bolus - continue dilt drop - plan for another 500 cc bolus if patient continues to be tachycardic >105 bpm  - appreciate cardiology recs

## 2024-02-23 NOTE — ED Notes (Signed)
 Attempts made by 2 nurse and Phlebotomy to draw labs and to start another IV site. Unsuccessful at this time. IV team consulted.

## 2024-02-23 NOTE — Progress Notes (Signed)
 Orthopedic Tech Progress Note Patient Details:  [name] [birth year] [id number]  Level 2 trauma  Patient ID: [name], female   DOB: [birth year], 89 y.o.   MRN: [id number]  [name] 02/23/2024, 11:42 AM

## 2024-02-23 NOTE — ED Notes (Signed)
 2L applied to pt. Via nasal cannula

## 2024-02-23 NOTE — ED Notes (Signed)
 Urology at bedside to talk with family provider went over consent with family and and states he will be taking her in fro procedure now.

## 2024-02-23 NOTE — Progress Notes (Signed)
 [facility]MT[facility] Brief Note Suzann Daring, MD   For questions about this patient, please use [email or web address] to page the family medicine resident on call. Pager number [phone].    I have seen and examined this patient, and reviewed their chart. I have discussed this patient with the resident. I will sign the resident note as as available.  89 yo with history of CKD IV, heart failure with mid range EF, atrial fibrillation, right bundle branch block presenting after being found at home. History limited as patient mostly groaning, confused. Per daughter, last known normal yesterday. Reports confusion around taking medications. ED evaluation notable for hypothermia, tachycardia, tachypnea.. EKG shows wide complex tachycardia. Imaging notable for left renal stone with concern for calyceal rupture.  Labs notable for thrombocytopenia and stable anemia. Potassium 5.3, creatinine near baseline of 3.07, anion gap 16. Troponin 63.  On exam, elderly appearing woman, tachypneic, groaning in bed, worse with palpation of abdomen. Irregular rate and rhythm, very tachycardic. Tachypneic with bilateral crackles. JVP visible. Pitting edema of both legs with xerotic skin.    Concern for severe sepsis with end organ damage (altered mental status), send blood culture, will start antibiotics. Send lactic acid. Gentle fluids of 250 cc only given underlying heart disease.  Fall at home, CK pending. Concern for rhabdomyolysis given unknown time down.  Altered mental status, likely due to severity of illness but does have dysarthria. MRI tomorrow if respiratory status improves.  Atrial fibrillation with rapid ventricular response, will start diltiazem  and heparin  drip. Cardiology consulted.  Hyperkalemia, repeat this PM.  Remainder per resident note as it is available.

## 2024-02-23 NOTE — Op Note (Signed)
 Preoperative diagnosis:  Infected left ureteral stone   Postoperative diagnosis:  same   Procedure:  Cystoscopy left ureteral stent placement left retrograde pyelography with interpretation   Surgeon: Morene MICAEL Salines, MD  Anesthesia: MAC  Complications: None  Intraoperative findings:   #1 - left retrograde pyelography demonstrated a filling defect within the left ureter consistent with the patients known calculus without other abnormalities. #2 -  entire bladder erythematous with thick sediment and foul smelling urine  EBL: Minimal  Specimens: None  Indication: [name] is a 89 y.o. patient with septic left ureteral stone. After reviewing the management options for treatment, he elected to proceed with the above surgical procedure(s). We have discussed the potential benefits and risks of the procedure, side effects of the proposed treatment, the likelihood of the patient achieving the goals of the procedure, and any potential problems that might occur during the procedure or recuperation. Informed consent has been obtained.  Description of procedure:  The patient was taken to the operating room and general anesthesia was induced.  The patient was placed in the dorsal lithotomy position, prepped and draped in the usual sterile fashion, and preoperative antibiotics were administered. A preoperative time-out was performed.   Cystourethroscopy was performed.  The patients urethra was examined and was normal . The bladder was then systematically examined in its entirety. There was no evidence for any bladder tumors, stones, or other mucosal pathology.    Attention then turned to the left ureteral orifice and a ureteral catheter was used to intubate the ureteral orifice.  Omnipaque  contrast was injected through the ureteral catheter and a retrograde pyelogram was performed with findings as dictated above.  A 0.38 sensor guidewire was then advanced up the left ureter into the  renal pelvis under fluoroscopic guidance.  The wire was then backloaded through the cystoscope and a ureteral stent was advance over the wire using Seldinger technique.  The stent was positioned appropriately under fluoroscopic and cystoscopic guidance.  The wire was then removed with an adequate stent curl noted in the renal pelvis as well as in the bladder.  The bladder was then emptied and the procedure ended.  The patient appeared to tolerate the procedure well and without complications.  The patient was able to be awakened and transferred to the recovery unit in satisfactory condition.    Morene MICAEL Salines, M.D.

## 2024-02-23 NOTE — Assessment & Plan Note (Signed)
 Holding all other home meds until swallow eval is able to be completed.

## 2024-02-23 NOTE — Assessment & Plan Note (Addendum)
 5.3 with repeat of 5.2 this morning.  Will repeat this evening. - repeat BMP this PM - AM BMP

## 2024-02-23 NOTE — Anesthesia Preprocedure Evaluation (Signed)
"                                    Anesthesia Evaluation  Patient identified by MRN, date of birth, ID band Patient confused    Reviewed: Allergy & Precautions, H&P , NPO status , Patient's Chart, lab work & pertinent test results  Airway Mallampati: III   Neck ROM: limited    Dental   Pulmonary asthma , former smoker   breath sounds clear to auscultation       Cardiovascular hypertension, +CHF  + dysrhythmias Atrial Fibrillation  Rhythm:irregular Rate:Tachycardia  Pt currently in AF with RVR 120s-130s  Diltiazem  gtt running.  TTE (07/2023): EF 45%, St Jude mechanical mitral valve in place with mean PG . Moderate-severe AS with mean PG 22 mmHg.   Neuro/Psych    GI/Hepatic ,GERD  ,,  Endo/Other    Renal/GU ARFRenal diseaseObstructed ureter.  Cr 3.33     Musculoskeletal   Abdominal   Peds  Hematology INR 1.4 (subtherapeutic)   Anesthesia Other Findings   Reproductive/Obstetrics                              Anesthesia Physical Anesthesia Plan  ASA: 4 and emergent  Anesthesia Plan: MAC   Post-op Pain Management:    Induction: Intravenous  PONV Risk Score and Plan: 2 and Propofol  infusion and Treatment may vary due to age or medical condition  Airway Management Planned: Simple Face Mask  Additional Equipment:   Intra-op Plan:   Post-operative Plan:   Informed Consent: I have reviewed the patients History and Physical, chart, labs and discussed the procedure including the risks, benefits and alternatives for the proposed anesthesia with the patient or authorized representative who has indicated his/her understanding and acceptance.   Patient has DNR.  Discussed DNR with power of attorney and Suspend DNR.   Dental advisory given  Plan Discussed with: CRNA, Anesthesiologist and Surgeon  Anesthesia Plan Comments:         Anesthesia Quick Evaluation  "

## 2024-02-23 NOTE — ED Notes (Signed)
 XR at [facility].

## 2024-02-24 ENCOUNTER — Inpatient Hospital Stay (HOSPITAL_COMMUNITY)

## 2024-02-24 ENCOUNTER — Encounter (HOSPITAL_COMMUNITY): Payer: Self-pay | Admitting: Urology

## 2024-02-24 DIAGNOSIS — J9600 Acute respiratory failure, unspecified whether with hypoxia or hypercapnia: Secondary | ICD-10-CM

## 2024-02-24 DIAGNOSIS — N3090 Cystitis, unspecified without hematuria: Secondary | ICD-10-CM | POA: Insufficient documentation

## 2024-02-24 DIAGNOSIS — R6520 Severe sepsis without septic shock: Secondary | ICD-10-CM

## 2024-02-24 DIAGNOSIS — I4891 Unspecified atrial fibrillation: Secondary | ICD-10-CM

## 2024-02-24 DIAGNOSIS — A419 Sepsis, unspecified organism: Secondary | ICD-10-CM | POA: Diagnosis not present

## 2024-02-24 LAB — URINALYSIS, W/ REFLEX TO CULTURE (INFECTION SUSPECTED)
Bilirubin Urine: NEGATIVE
Glucose, UA: NEGATIVE mg/dL
Ketones, ur: NEGATIVE mg/dL
Nitrite: NEGATIVE
Protein, ur: 100 mg/dL — AB
RBC / HPF: >50 RBC/hpf (ref 0–5)
Specific Gravity, Urine: 1.014 (ref 1.005–1.030)
WBC, UA: >50 WBC/hpf (ref 0–5)
pH: 5 (ref 5.0–8.0)

## 2024-02-24 LAB — BASIC METABOLIC PANEL WITH GFR
Anion gap: 13 (ref 5–15)
BUN: 66 mg/dL — ABNORMAL HIGH (ref 8–23)
CO2: 21 mmol/L — ABNORMAL LOW (ref 22–32)
Calcium: 8.6 mg/dL — ABNORMAL LOW (ref 8.9–10.3)
Chloride: 109 mmol/L (ref 98–111)
Creatinine, Ser: 3.42 mg/dL — ABNORMAL HIGH (ref 0.44–1.00)
GFR, Estimated: 12 mL/min — ABNORMAL LOW
Glucose, Bld: 77 mg/dL (ref 70–99)
Potassium: 4.8 mmol/L (ref 3.5–5.1)
Sodium: 143 mmol/L (ref 135–145)

## 2024-02-24 LAB — CBC
HCT: 30.7 % — ABNORMAL LOW (ref 36.0–46.0)
Hemoglobin: 8.6 g/dL — ABNORMAL LOW (ref 12.0–15.0)
MCH: 25.2 pg — ABNORMAL LOW (ref 26.0–34.0)
MCHC: 28 g/dL — ABNORMAL LOW (ref 30.0–36.0)
MCV: 90 fL (ref 80.0–100.0)
Platelets: 108 10*3/uL — ABNORMAL LOW (ref 150–400)
RBC: 3.41 MIL/uL — ABNORMAL LOW (ref 3.87–5.11)
RDW: 20.1 % — ABNORMAL HIGH (ref 11.5–15.5)
WBC: 8.2 10*3/uL (ref 4.0–10.5)
nRBC: 0 % (ref 0.0–0.2)

## 2024-02-24 LAB — COMPREHENSIVE METABOLIC PANEL WITH GFR
ALT: 8 U/L (ref 0–44)
AST: 23 U/L (ref 15–41)
Albumin: 2.9 g/dL — ABNORMAL LOW (ref 3.5–5.0)
Alkaline Phosphatase: 49 U/L (ref 38–126)
Anion gap: 13 (ref 5–15)
BUN: 63 mg/dL — ABNORMAL HIGH (ref 8–23)
CO2: 21 mmol/L — ABNORMAL LOW (ref 22–32)
Calcium: 8.6 mg/dL — ABNORMAL LOW (ref 8.9–10.3)
Chloride: 109 mmol/L (ref 98–111)
Creatinine, Ser: 3.34 mg/dL — ABNORMAL HIGH (ref 0.44–1.00)
GFR, Estimated: 13 mL/min — ABNORMAL LOW
Glucose, Bld: 87 mg/dL (ref 70–99)
Potassium: 5.3 mmol/L — ABNORMAL HIGH (ref 3.5–5.1)
Sodium: 142 mmol/L (ref 135–145)
Total Bilirubin: 1 mg/dL (ref 0.0–1.2)
Total Protein: 7.9 g/dL (ref 6.5–8.1)

## 2024-02-24 LAB — ECHOCARDIOGRAM COMPLETE
AR max vel: 1.05 cm2
AV Area VTI: 0.99 cm2
AV Area mean vel: 0.99 cm2
AV Mean grad: 24 mmHg
AV Peak grad: 39.5 mmHg
Ao pk vel: 3.14 m/s
Area-P 1/2: 4.06 cm2
Calc EF: 5.3 %
MV VTI: 2.82 cm2
S' Lateral: 3.4 cm
Single Plane A2C EF: -1.6 %
Single Plane A4C EF: 12 %
Weight: 2299.84 [oz_av]

## 2024-02-24 LAB — BLOOD GAS, VENOUS
Acid-base deficit: 2 mmol/L (ref 0.0–2.0)
Bicarbonate: 23.7 mmol/L (ref 20.0–28.0)
O2 Saturation: 60.3 %
Patient temperature: 36.4
pCO2, Ven: 42 mmHg — ABNORMAL LOW (ref 44–60)
pH, Ven: 7.36 (ref 7.25–7.43)
pO2, Ven: 39 mmHg (ref 32–45)

## 2024-02-24 LAB — LACTIC ACID, PLASMA: Lactic Acid, Venous: 1.5 mmol/L (ref 0.5–1.9)

## 2024-02-24 LAB — HEPARIN LEVEL (UNFRACTIONATED)
Heparin Unfractionated: 0.23 [IU]/mL — ABNORMAL LOW (ref 0.30–0.70)
Heparin Unfractionated: 0.21 [IU]/mL — ABNORMAL LOW (ref 0.30–0.70)

## 2024-02-24 LAB — PHOSPHORUS: Phosphorus: 4.2 mg/dL (ref 2.5–4.6)

## 2024-02-24 LAB — PROTIME-INR
INR: 1.7 — ABNORMAL HIGH (ref 0.8–1.2)
Prothrombin Time: 21.3 s — ABNORMAL HIGH (ref 11.4–15.2)

## 2024-02-24 LAB — MAGNESIUM: Magnesium: 2.5 mg/dL — ABNORMAL HIGH (ref 1.7–2.4)

## 2024-02-24 MED ORDER — HEPARIN BOLUS VIA INFUSION
1000.0000 [IU] | Freq: Once | INTRAVENOUS | Status: AC
  Administered 2024-02-24: 1000 [IU] via INTRAVENOUS
  Filled 2024-02-24: qty 1000

## 2024-02-24 MED ORDER — SODIUM CHLORIDE 0.9 % IV SOLN: INTRAVENOUS | Status: AC | PRN

## 2024-02-24 MED ORDER — FUROSEMIDE 10 MG/ML IJ SOLN
160.0000 mg | Freq: Once | INTRAVENOUS | Status: AC
  Administered 2024-02-24: 160 mg via INTRAVENOUS
  Filled 2024-02-24: qty 10

## 2024-02-24 MED ORDER — CHLORHEXIDINE GLUCONATE CLOTH 2 % EX PADS
6.0000 | MEDICATED_PAD | Freq: Every day | CUTANEOUS | Status: AC
  Administered 2024-02-25: 6 via TOPICAL

## 2024-02-24 MED ORDER — DEXTROSE 5 % IV SOLN
500.0000 mg | Freq: Once | INTRAVENOUS | Status: AC
  Administered 2024-02-24: 500 mg via INTRAVENOUS
  Filled 2024-02-24: qty 17.8

## 2024-02-24 MED ORDER — DEXTROSE 5 % IV SOLN: 250.0000 mg | Freq: Once | INTRAVENOUS | Status: DC

## 2024-02-24 MED ORDER — VANCOMYCIN VARIABLE DOSE PER UNSTABLE RENAL FUNCTION (PHARMACIST DOSING): Status: DC

## 2024-02-24 MED ORDER — HYDROMORPHONE HCL 1 MG/ML IJ SOLN
0.5000 mg | Freq: Once | INTRAMUSCULAR | Status: AC
  Administered 2024-02-25: 0.5 mg via INTRAVENOUS
  Filled 2024-02-24: qty 1

## 2024-02-24 MED ORDER — PERFLUTREN LIPID MICROSPHERE
1.0000 mL | INTRAVENOUS | Status: AC | PRN
  Administered 2024-02-24: 2 mL via INTRAVENOUS

## 2024-02-24 NOTE — Progress Notes (Signed)
 "    Daily Progress Note Intern Pager: [phone]  [name] Medical record number: [id number] Date of birth: 06-03-1935 Age: 89 y.o. Gender: female  Primary Care Provider: [name] Camie CROME, MD Consultants: Urology, Cardiology Code Status: DNR  Pt Overview and Major Events to Date:  2/3 - admitted, L Ureteral stent placement  Medical Decision Making:  [name] is an 89 yo F presenting with Sepsis from bladder infection, heart failure, kidney failure, that is POD1 for Uretral stent placement who had an unwitnessed fall at home and AMS. PPMHx includes Bilateral HF (EF30-35%), A.Fib w/ RVR, CKD.  We are monitoring her vitals and fluid status. If RR become less labored, we can dc the BiPAP, if we have ongoing tachycardia, can start IV Amio, but this needs to be cautioned given her HF (Cardiology recommendation is consider if HR >130). Have given lots of diuresis, but only ~1L Urinary output, likely prerenal. Want to diurese for HF, but don't want too dry given acute on chronic kidney injury. Monitoring for now. Prognosis is guarded, considering palliative consult.  Assessment & Plan Sepsis (HCC) Kidney stone Chronic kidney disease (CKD) Bladder infection Hypothermic to 96.4 Fahrenheit, tachycardic to 150s, tachypneic to 40s, bladder is likley source. CTAP showed 4X9 mm calculus in left renal pelvis region and dilated extrarenal pelvis and small posterior inferior fluid, possibly 2/2 sequelae of focal calyceal rupture. VBG: pCO2 42, Lactic Acid 1.5, Bladder scans have shown minimal urine, suggesting pre-renal etiology for low urine output. Cr. 3.36 - Urology consulted, appreciate recs  - Zosyn  and vancomycin  per pharmacy - Hydrating as able against heart failure - Blood culture x 2, pending - AM CBC, CMP Fall History of mitral valve replacement - St Jude mechanical valve Warfarin anticoagulation Unwitnessed fall at home, patient on warfarin.  However, unclear compliance  as of late.  INR 1.4, subtherapeutic, believe goal to be 2.5 - 3.5. CK 140. - Heparin  drip per pharmacy  - Fall precautions - Pain: Dilaudid  0.5mg  once, reassess frequently as patient not sufficiently oriented to request analgesics Atrial fibrillation with RVR (HCC) Patient in A-fib with RVR with rates in the 140s as well as wide complex tachycardia. Home medication diltiazem  180 mg daily.  Initial troponin 63, repeat 70.  EKG showed tachycardic, A-fib, RBBB.  Patient has known RBBB. Suspect partly d/t pain and infection.  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 Echo showed EF 30-35%, increased atrial pressures, tricuspid regurgitation. Pt given heavy diuresis night of 02/23/2024 with ~1L urine output.  - BiPAP - Strict I&Os - Daily weights - Monitoring closely in case more diuresis is needed AMS (altered mental status) Unclear etiology.  Differential includes sepsis, stroke, and arrhythmia as above.  CT head showed no acute intracranial abnormality, moderate chronic small vessel ischemia, and cerebral atrophy. - MRI once patient has improved clinically, unable to complete at this time due to severity of illness - Delirium precautions - Neurochecks every 4 hours - Bedside swallow evaluation - NPO pending BSE Hyperkalemia (RESOLVED) 5.3 initially, down to 4.8 on 2/4 after heavy IV lasix .  Will continue to monitor. - AM CMP  Chronic health problem  Holding all other home meds until swallow eval is able to be completed.  FEN/GI: NPO PPx: Heparin  per pharmacy Dispo:TBD pending clinical improvement .  Barriers include clinical stability Prognosis: Guarded   Subjective:  Pt was weakly following commands to squeeze fingers this morning while on BiPAP. Not oriented to provide substantial history, but improved from admission.  Objective: Temp:  [97.5 F (36.4 C)-98 F (36.7 C)] 97.5 F (36.4 C) (02/04 0400) Pulse Rate:  [49-129] 66 (02/04 1100) Resp:  [22-46] 29 (02/04 1100) BP: (85-122)/(50-88) 99/57 (02/04 1100) SpO2:  [93 %-100 %] 100 % (02/04 1100) Weight:  [65.2 kg] 65.2 kg (02/04 0400)  Physical Exam Vitals and nursing note reviewed. Exam conducted with a chaperone present.  Constitutional:      General: She is in acute distress.     Appearance: She is ill-appearing.  Cardiovascular:     Rate and Rhythm: Normal rate and regular rhythm.     Heart sounds: Murmur heard.  Pulmonary:     Effort: Pulmonary effort is normal.     Comments: On BiPAP Abdominal:     General: Bowel sounds are normal.  Musculoskeletal:     Right lower leg: No edema.     Left lower leg: No edema.  Skin:    General: Skin is warm and dry.     Findings: Rash present.     Comments: Very dry skin, with 1+ pitting edema in the legs  Neurological:     Mental Status: She is alert.     Laboratory: Most recent CBC Lab Results  Component Value Date   WBC 8.2 02/24/2024   HGB 8.6 (L) 02/24/2024   HCT 30.7 (L) 02/24/2024   MCV 90.0 02/24/2024   PLT 108 (L) 02/24/2024   Most recent BMP    Latest Ref Rng & Units 02/24/2024    8:32 AM  BMP  Glucose 70 - 99 mg/dL 77   BUN 8 - 23 mg/dL 66   Creatinine 9.55 - 1.00 mg/dL 6.57   Sodium 864 - 854 mmol/L 143   Potassium 3.5 - 5.1 mmol/L 4.8   Chloride 98 - 111 mmol/L 109   CO2 22 - 32 mmol/L 21   Calcium 8.9 - 10.3 mg/dL 8.6     Imaging/Diagnostic Tests: Radiologist Impression:  ECHOCARDIOGRAM COMPLETE Result Date: 02/24/2024    ECHOCARDIOGRAM REPORT   Patient Name:   [name] Date of Exam: 02/24/2024 Medical Rec #:  [id number]      Height:        68.0 in Accession #:    [id number]     Weight:       143.7 lb Date of Birth:  October 13, 1935      BSA:          1.776 m Patient Age:    88 years       BP:           96/69 mmHg Patient Gender: F              HR:           136 bpm. Exam Location:  Inpatient Procedure: 2D Echo, Cardiac Doppler, Color Doppler and Intracardiac            Opacification Agent (Both Spectral and Color Flow Doppler were            utilized during procedure). STAT ECHO REPORT CONTAINS CRITICAL RESULT Indications:    I48.91* Unspeicified atrial fibrillation  History:  Patient has prior history of Echocardiogram examinations, most                 recent 08/16/2023. CHF, Abnormal ECG, Mitral Valve Disease,                 Arrythmias:Atrial Fibrillation and VT; Signs/Symptoms:Altered                 Mental Status. MVR.                  Mitral Valve: St. Jude mechanical valve valve is present in the                 mitral position.  Sonographer:    Ellouise Mose RDCS Referring Phys: [id number] CARINA M BROWN  Sonographer Comments: Technically difficult study due to poor echo windows. Image acquisition challenging due to patient behavioral factors. and Image acquisition challenging due to uncooperative patient. Patient with AMS hitting my arm throughout exam. Patient screaming with CPAP mask. IMPRESSIONS  1. Left ventricular ejection fraction, by estimation, is 30 to 35%. The left ventricle has moderately decreased function. The left ventricle demonstrates regional wall motion abnormalities (see scoring diagram/findings for description). The left ventricular internal cavity size was mildly dilated. There is mild left ventricular hypertrophy. Left ventricular diastolic parameters are indeterminate.  2. Right ventricular systolic function is moderately reduced. The right ventricular size is severely enlarged. There is severely elevated pulmonary artery systolic pressure. The estimated right ventricular systolic pressure is 71.2 mmHg.  3. Left atrial size  was severely dilated.  4. Right atrial size was severely dilated.  5. A small pericardial effusion is present. The pericardial effusion is circumferential. There is no evidence of cardiac tamponade.  6. The mitral valve has been repaired/replaced. Trivial mitral valve regurgitation. Mild to moderate mitral stenosis. There is a St. Jude mechanical valve present in the mitral position.  7. Tricuspid valve regurgitation is moderate to severe.  8. The aortic valve is calcified. Aortic valve regurgitation is trivial. Moderate aortic valve stenosis. FINDINGS  Left Ventricle: Left ventricular ejection fraction, by estimation, is 30 to 35%. The left ventricle has moderately decreased function. The left ventricle demonstrates regional wall motion abnormalities. The left ventricular internal cavity size was mildly dilated. There is mild left ventricular hypertrophy. Left ventricular diastolic parameters are indeterminate. Right Ventricle: The right ventricular size is severely enlarged. No increase in right ventricular wall thickness. Right ventricular systolic function is moderately reduced. There is severely elevated pulmonary artery systolic pressure. The tricuspid regurgitant velocity is 3.75 m/s, and with an assumed right atrial pressure of 15 mmHg, the estimated right ventricular systolic pressure is 71.2 mmHg. Left Atrium: Left atrial size was severely dilated. Right Atrium: Right atrial size was severely dilated. Pericardium: A small pericardial effusion is present. The pericardial effusion is circumferential. There is no evidence of cardiac tamponade. Mitral Valve: The mitral valve has been repaired/replaced. Trivial mitral valve regurgitation. There is a St. Jude mechanical valve present in the mitral position. Mild to moderate mitral valve stenosis. MV peak gradient, 10.2 mmHg. The mean mitral valve  gradient is 6.0 mmHg. Tricuspid Valve: The tricuspid valve is grossly normal. Tricuspid valve regurgitation is  moderate to severe. Aortic Valve: The aortic valve is calcified. Aortic valve regurgitation is trivial. Moderate aortic stenosis is present. Aortic valve mean gradient measures 24.0 mmHg. Aortic valve peak gradient measures 39.5 mmHg. Aortic valve area, by VTI measures 0.99  cm. Pulmonic Valve: The pulmonic valve was normal in  structure. Pulmonic valve regurgitation is trivial. No evidence of pulmonic stenosis. Aorta: The aortic root and ascending aorta are structurally normal, with no evidence of dilitation. IAS/Shunts: No atrial level shunt detected by color flow Doppler. Additional Comments: 3D was performed not requiring image post processing on an independent workstation and was abnormal.  LEFT VENTRICLE PLAX 2D LVIDd:         4.50 cm LVIDs:         3.40 cm LV PW:         1.60 cm LV IVS:        1.20 cm LVOT diam:     2.26 cm LV SV:         60 LV SV Index:   34 LVOT Area:     4.01 cm  LV Volumes (MOD) LV vol d, MOD A2C: 61.5 ml LV vol d, MOD A4C: 52.4 ml LV vol s, MOD A2C: 62.5 ml LV vol s, MOD A4C: 46.0 ml LV SV MOD A2C:     -1.0 ml LV SV MOD A4C:     52.4 ml LV SV MOD BP:      3.0 ml RIGHT VENTRICLE            IVC RV S prime:     7.83 cm/s  IVC diam: 2.15 cm TAPSE (M-mode): 0.7 cm LEFT ATRIUM              Index         RIGHT ATRIUM           Index LA diam:        4.38 cm  2.47 cm/m    RA Area:     34.50 cm LA Vol (A2C):   360.0 ml 202.70 ml/m  RA Volume:   129.00 ml 72.63 ml/m LA Vol (A4C):   227.0 ml 127.81 ml/m LA Biplane Vol: 304.0 ml 171.17 ml/m  AORTIC VALVE AV Area (Vmax):    1.05 cm AV Area (Vmean):   0.99 cm AV Area (VTI):     0.99 cm AV Vmax:           314.33 cm/s AV Vmean:          229.333 cm/s AV VTI:            0.606 m AV Peak Grad:      39.5 mmHg AV Mean Grad:      24.0 mmHg LVOT Vmax:         82.10 cm/s LVOT Vmean:        56.700 cm/s LVOT VTI:          0.149 m LVOT/AV VTI ratio: 0.25  AORTA Ao Root diam: 3.07 cm Ao Asc diam:  2.81 cm MITRAL VALVE                TRICUSPID VALVE MV Area  (PHT): 4.06 cm     TR Peak grad:   56.2 mmHg MV Area VTI:   2.82 cm     TR Vmax:        375.00 cm/s MV Peak grad:  10.2 mmHg MV Mean grad:  6.0 mmHg     SHUNTS MV Vmax:       1.60 m/s     Systemic VTI:  0.15 m MV Vmean:      113.0 cm/s   Systemic Diam: 2.26 cm MV Decel Time: 187 msec MV E velocity: 152.00 cm/s Dub Tobb DO Electronically signed by Dub Huntsman DO Signature Date/Time: 02/24/2024/9:13:51  AM    Final    DG C-Arm 1-60 Min Result Date: 02/23/2024 CLINICAL DATA:  Cystoscopy, left ureteral stent insertion EXAM: DG C-ARM 1-60 MIN COMPARISON:  02/23/2024 FINDINGS: Two fluoroscopic images are obtained during the performance of the procedure and are provided for interpretation only. Initial image demonstrates retrograde ureteral g with evidence of filling defect in the proximal left ureter. Subsequent image demonstrates left ureteral stent placement, proximal aspect coiled in the region of the left renal pelvis. Please refer to the operative report. Fluoroscopy time: 34 seconds, 5.06 mGy IMPRESSION: 1. Intraoperative exam as above. Please refer to the operative report. Electronically Signed   By: Ozell Daring M.D.   On: 02/23/2024 21:20   DG CHEST PORT 1 VIEW Result Date: 02/23/2024 CLINICAL DATA:  Tachypnea EXAM: PORTABLE CHEST 1 VIEW COMPARISON:  02/23/2024 at 11:41 a.m. FINDINGS: Single frontal view of the chest demonstrates stable marked enlargement of the cardiac silhouette. Persistent vascular congestion, with stable bilateral pleural effusions. No airspace disease or pneumothorax. No acute bony abnormalities. IMPRESSION: 1. Stable cardiomegaly. 2. Stable vascular congestion and small bilateral pleural effusions. Electronically Signed   By: Ozell Daring M.D.   On: 02/23/2024 21:19   Lera Nancyann NOVAK, DO 02/24/2024, 3:16 PM  PGY-1, [facility] FPTS Intern pager: [phone], text pages welcome Secure chat group [facility] Teaching Service   "

## 2024-02-24 NOTE — Assessment & Plan Note (Signed)
 Hypothermic to 96.4 Fahrenheit, tachycardic to 150s, tachypneic to 40s, bladder is likley source. CTAP showed 4X9 mm calculus in left renal pelvis region and dilated extrarenal pelvis and small posterior inferior fluid, possibly 2/2 sequelae of focal calyceal rupture. VBG: pCO2 42, Lactic Acid 1.5, Bladder scans have shown minimal urine, suggesting pre-renal etiology for low urine output. Cr. 3.36 - Urology consulted, appreciate recs  - Zosyn  and vancomycin  per pharmacy - Hydrating as able against heart failure - Blood culture x 2, pending - AM CBC, CMP

## 2024-02-24 NOTE — Assessment & Plan Note (Signed)
 Unwitnessed fall at home, patient on warfarin.  However, unclear compliance as of late.  INR 1.4, subtherapeutic, believe goal to be 2.5 - 3.5. CK 140. - Heparin  drip per pharmacy  - Fall precautions - Pain: Dilaudid  0.5mg  once, reassess frequently as patient not sufficiently oriented to request analgesics

## 2024-02-24 NOTE — Assessment & Plan Note (Signed)
 Vascular congestion and small plueral effusion seen on CXR. Echo showed EF 30-35%, increased atrial pressures, tricuspid regurgitation. Pt given heavy diuresis night of 02/23/2024 with ~1L urine output.  - BiPAP - Strict I&Os - Daily weights - Monitoring closely in case more diuresis is needed

## 2024-02-24 NOTE — Progress Notes (Signed)
 OT Cancellation Note  Patient Details Name: [name] MRN: [id number] DOB: May 07, 1935   Cancelled Treatment:    Reason Eval/Treat Not Completed: Medical issues which prohibited therapy. Nursing asked to defer this pt until tomorrow due to pt not medically stable to get up or participate at bed level. Will attempt back as schedule allows.  Joshua Silvano Dragon 02/24/2024, 7:35 AM

## 2024-02-24 NOTE — TOC CAGE-AID Note (Signed)
 Transition of Care [facility]) - CAGE-AID Screening   Patient Details  Name: [name] MRN: [id number] Date of Birth: 23-Jan-1935  Transition of Care [facility]) CM/SW Contact:    Evora Schechter E Joselin Crandell, LCSW Phone Number: 02/24/2024, 9:01 AM   Clinical Narrative: Disoriented x 4   CAGE-AID Screening: Substance Abuse Screening unable to be completed due to: : Patient unable to participate

## 2024-02-24 NOTE — Progress Notes (Signed)
 "  1 Day Post-Op Subjective: Patient sleeping on BiPAP on rounds.  Did not attempt to wake her.  Objective: Vital signs in last 24 hours: Temp:  [97.5 F (36.4 C)-98 F (36.7 C)] 97.5 F (36.4 C) (02/04 0400) Pulse Rate:  [49-129] 66 (02/04 1100) Resp:  [22-46] 29 (02/04 1100) BP: (85-122)/(50-90) 99/57 (02/04 1100) SpO2:  [93 %-100 %] 100 % (02/04 1100) Weight:  [65.2 kg] 65.2 kg (02/04 0400)  Assessment/Plan: # Sepsis # Cardiogenic shock- EF 30% # Left ureteral stone  To the OR with Dr. Cam on 02/23/2024 for urgent left ureteral stent placement.   Soft BP with ongoing tachycardia`with occasional bradycardia  Trend labs, SCr 3.42, slightly above baseline of around 3.0  Definitive stone management on an outpatient basis when she is cleared her infection.  62f Foley catheter in place to facilitate drainage.  May remove after patient has been fever free for 24 hours.  Guarded prognosis  Intake/Output from previous day: 02/03 0701 - 02/04 0700 In: 1112.9 [I.V.:321; IV Piggyback:792] Out: 700 [Urine:700]  Intake/Output this shift: Total I/O In: 66 [IV Piggyback:66] Out: 300 [Urine:300]  Physical Exam:  General: Sleeping CV: No cyanosis Lungs: equal chest rise Gu: 65F Foley catheter in place  Lab Results: Recent Labs    02/23/24 1139 02/23/24 1145 02/24/24 0239  HGB 10.1* 11.9* 8.6*  HCT 35.9* 35.0* 30.7*   BMET Recent Labs    02/23/24 1139 02/23/24 1145 02/23/24 1734 02/24/24 0000 02/24/24 0239 02/24/24 0832  NA 143 146*   < > 142  --  143  K 5.3* 5.2*   < > 5.3*  --  4.8  CL 109 116*   < > 109  --  109  CO2 18*  --    < > 21*  --  21*  GLUCOSE 96 97   < > 87  --  77  BUN 57* 55*   < > 63*  --  66*  CREATININE 3.07* 3.30*   < > 3.34*  --  3.42*  CALCIUM 9.1  --    < > 8.6*  --  8.6*  HGB 10.1* 11.9*  --   --  8.6*  --   WBC 8.3  --   --   --  8.2  --    < > = values in this interval not displayed.     Studies/Results: ECHOCARDIOGRAM  COMPLETE Result Date: 02/24/2024    ECHOCARDIOGRAM REPORT   Patient Name:   [name] Date of Exam: 02/24/2024 Medical Rec #:  [id number]      Height:       68.0 in Accession #:    [id number]     Weight:       143.7 lb Date of Birth:  Apr 10, 1935      BSA:          1.776 m Patient Age:    89 years       BP:           96/69 mmHg Patient Gender: F              HR:           136 bpm. Exam Location:  Inpatient Procedure: 2D Echo, Cardiac Doppler, Color Doppler and Intracardiac            Opacification Agent (Both Spectral and Color Flow Doppler were            utilized during procedure). STAT ECHO REPORT  CONTAINS CRITICAL RESULT Indications:    I48.91* Unspeicified atrial fibrillation  History:        Patient has prior history of Echocardiogram examinations, most                 recent 08/16/2023. CHF, Abnormal ECG, Mitral Valve Disease,                 Arrythmias:Atrial Fibrillation and VT; Signs/Symptoms:Altered                 Mental Status. MVR.                  Mitral Valve: St. Jude mechanical valve valve is present in the                 mitral position.  Sonographer:    Ellouise Mose RDCS Referring Phys: [id number] CARINA M BROWN  Sonographer Comments: Technically difficult study due to poor echo windows. Image acquisition challenging due to patient behavioral factors. and Image acquisition challenging due to uncooperative patient. Patient with AMS hitting my arm throughout exam. Patient screaming with CPAP mask. IMPRESSIONS  1. Left ventricular ejection fraction, by estimation, is 30 to 35%. The left ventricle has moderately decreased function. The left ventricle demonstrates regional wall motion abnormalities (see scoring diagram/findings for description). The left ventricular internal cavity size was mildly dilated. There is mild left ventricular hypertrophy. Left ventricular diastolic parameters are indeterminate.  2. Right ventricular systolic function is moderately reduced. The right ventricular size is severely  enlarged. There is severely elevated pulmonary artery systolic pressure. The estimated right ventricular systolic pressure is 71.2 mmHg.  3. Left atrial size was severely dilated.  4. Right atrial size was severely dilated.  5. A small pericardial effusion is present. The pericardial effusion is circumferential. There is no evidence of cardiac tamponade.  6. The mitral valve has been repaired/replaced. Trivial mitral valve regurgitation. Mild to moderate mitral stenosis. There is a St. Jude mechanical valve present in the mitral position.  7. Tricuspid valve regurgitation is moderate to severe.  8. The aortic valve is calcified. Aortic valve regurgitation is trivial. Moderate aortic valve stenosis. FINDINGS  Left Ventricle: Left ventricular ejection fraction, by estimation, is 30 to 35%. The left ventricle has moderately decreased function. The left ventricle demonstrates regional wall motion abnormalities. The left ventricular internal cavity size was mildly dilated. There is mild left ventricular hypertrophy. Left ventricular diastolic parameters are indeterminate. Right Ventricle: The right ventricular size is severely enlarged. No increase in right ventricular wall thickness. Right ventricular systolic function is moderately reduced. There is severely elevated pulmonary artery systolic pressure. The tricuspid regurgitant velocity is 3.75 m/s, and with an assumed right atrial pressure of 15 mmHg, the estimated right ventricular systolic pressure is 71.2 mmHg. Left Atrium: Left atrial size was severely dilated. Right Atrium: Right atrial size was severely dilated. Pericardium: A small pericardial effusion is present. The pericardial effusion is circumferential. There is no evidence of cardiac tamponade. Mitral Valve: The mitral valve has been repaired/replaced. Trivial mitral valve regurgitation. There is a St. Jude mechanical valve present in the mitral position. Mild to moderate mitral valve stenosis. MV peak  gradient, 10.2 mmHg. The mean mitral valve  gradient is 6.0 mmHg. Tricuspid Valve: The tricuspid valve is grossly normal. Tricuspid valve regurgitation is moderate to severe. Aortic Valve: The aortic valve is calcified. Aortic valve regurgitation is trivial. Moderate aortic stenosis is present. Aortic valve mean gradient measures 24.0 mmHg. Aortic valve peak gradient  measures 39.5 mmHg. Aortic valve area, by VTI measures 0.99  cm. Pulmonic Valve: The pulmonic valve was normal in structure. Pulmonic valve regurgitation is trivial. No evidence of pulmonic stenosis. Aorta: The aortic root and ascending aorta are structurally normal, with no evidence of dilitation. IAS/Shunts: No atrial level shunt detected by color flow Doppler. Additional Comments: 3D was performed not requiring image post processing on an independent workstation and was abnormal.  LEFT VENTRICLE PLAX 2D LVIDd:         4.50 cm LVIDs:         3.40 cm LV PW:         1.60 cm LV IVS:        1.20 cm LVOT diam:     2.26 cm LV SV:         60 LV SV Index:   34 LVOT Area:     4.01 cm  LV Volumes (MOD) LV vol d, MOD A2C: 61.5 ml LV vol d, MOD A4C: 52.4 ml LV vol s, MOD A2C: 62.5 ml LV vol s, MOD A4C: 46.0 ml LV SV MOD A2C:     -1.0 ml LV SV MOD A4C:     52.4 ml LV SV MOD BP:      3.0 ml RIGHT VENTRICLE            IVC RV S prime:     7.83 cm/s  IVC diam: 2.15 cm TAPSE (M-mode): 0.7 cm LEFT ATRIUM              Index         RIGHT ATRIUM           Index LA diam:        4.38 cm  2.47 cm/m    RA Area:     34.50 cm LA Vol (A2C):   360.0 ml 202.70 ml/m  RA Volume:   129.00 ml 72.63 ml/m LA Vol (A4C):   227.0 ml 127.81 ml/m LA Biplane Vol: 304.0 ml 171.17 ml/m  AORTIC VALVE AV Area (Vmax):    1.05 cm AV Area (Vmean):   0.99 cm AV Area (VTI):     0.99 cm AV Vmax:           314.33 cm/s AV Vmean:          229.333 cm/s AV VTI:            0.606 m AV Peak Grad:      39.5 mmHg AV Mean Grad:      24.0 mmHg LVOT Vmax:         82.10 cm/s LVOT Vmean:        56.700 cm/s  LVOT VTI:          0.149 m LVOT/AV VTI ratio: 0.25  AORTA Ao Root diam: 3.07 cm Ao Asc diam:  2.81 cm MITRAL VALVE                TRICUSPID VALVE MV Area (PHT): 4.06 cm     TR Peak grad:   56.2 mmHg MV Area VTI:   2.82 cm     TR Vmax:        375.00 cm/s MV Peak grad:  10.2 mmHg MV Mean grad:  6.0 mmHg     SHUNTS MV Vmax:       1.60 m/s     Systemic VTI:  0.15 m MV Vmean:      113.0 cm/s   Systemic Diam: 2.26 cm MV Decel  Time: 187 msec MV E velocity: 152.00 cm/s Dub Tobb DO Electronically signed by Dub Huntsman DO Signature Date/Time: 02/24/2024/9:13:51 AM    Final    DG C-Arm 1-60 Min Result Date: 02/23/2024 CLINICAL DATA:  Cystoscopy, left ureteral stent insertion EXAM: DG C-ARM 1-60 MIN COMPARISON:  02/23/2024 FINDINGS: Two fluoroscopic images are obtained during the performance of the procedure and are provided for interpretation only. Initial image demonstrates retrograde ureteral g with evidence of filling defect in the proximal left ureter. Subsequent image demonstrates left ureteral stent placement, proximal aspect coiled in the region of the left renal pelvis. Please refer to the operative report. Fluoroscopy time: 34 seconds, 5.06 mGy IMPRESSION: 1. Intraoperative exam as above. Please refer to the operative report. Electronically Signed   By: Ozell Daring M.D.   On: 02/23/2024 21:20   DG CHEST PORT 1 VIEW Result Date: 02/23/2024 CLINICAL DATA:  Tachypnea EXAM: PORTABLE CHEST 1 VIEW COMPARISON:  02/23/2024 at 11:41 a.m. FINDINGS: Single frontal view of the chest demonstrates stable marked enlargement of the cardiac silhouette. Persistent vascular congestion, with stable bilateral pleural effusions. No airspace disease or pneumothorax. No acute bony abnormalities. IMPRESSION: 1. Stable cardiomegaly. 2. Stable vascular congestion and small bilateral pleural effusions. Electronically Signed   By: Ozell Daring M.D.   On: 02/23/2024 21:19   CT CHEST ABDOMEN PELVIS WO CONTRAST Result Date:  02/23/2024 CLINICAL DATA:  Polytrauma, blunt.  Fall. EXAM: CT CHEST, ABDOMEN AND PELVIS WITHOUT CONTRAST TECHNIQUE: Multidetector CT imaging of the chest, abdomen and pelvis was performed following the standard protocol without IV contrast. RADIATION DOSE REDUCTION: This exam was performed according to the departmental dose-optimization program which includes automated exposure control, adjustment of the mA and/or kV according to patient size and/or use of iterative reconstruction technique. COMPARISON:  CT scan chest from 05/10/2022 and CT virtual colonoscopy from 04/09/2018. FINDINGS: CT CHEST FINDINGS Cardiovascular: Marked cardiomegaly. No pericardial effusion. No aortic aneurysm. There are coronary artery calcifications, in keeping with coronary artery disease. There are also mild to moderate peripheral atherosclerotic vascular calcifications of thoracic aorta and its major branches. There is dilation of the main pulmonary trunk measuring up to 4.1 cm, which is nonspecific but can be seen with pulmonary artery hypertension. Prosthetic mitral valve noted. There are aortic valve calcifications. Mediastinum/Nodes: Visualized thyroid  gland appears grossly unremarkable. No solid / cystic mediastinal masses. The esophagus is nondistended precluding optimal assessment. No mediastinal or axillary lymphadenopathy by size criteria. Evaluation of bilateral hila is limited due to lack on intravenous contrast: however, no large hilar lymphadenopathy identified. Lungs/Pleura: The central tracheo-bronchial tree is patent. Bilateral small-to-moderate pleural effusions noted with associated compressive atelectatic changes in the lungs. There are patchy areas of smooth interlobular and interlobular septal thickening mainly in the bilateral lung apices, concerning for congestive heart failure/pulmonary edema. No suspicious mass, consolidation or lung collapse. No pneumothorax. No suspicious lung nodule. Musculoskeletal: The  visualized soft tissues of the chest wall are grossly unremarkable. Sternotomy wires noted. No suspicious osseous lesions. There are mild to moderate multilevel degenerative changes in the visualized spine. CT ABDOMEN PELVIS FINDINGS Hepatobiliary: The liver is normal in size. There is diffuse liver surface nodularity, compatible with cirrhosis. There is a peripheral/subpleural, triangular, wedge-shaped hypoattenuating area in the right hepatic lobe, segment 6 (series 2, image 57), which is incompletely characterized on the current exam but appears grossly unchanged since the prior study from 2020 and favored benign etiology. No other focal liver lesion seen on this unenhanced exam. No intrahepatic or  extrahepatic bile duct dilation. Gallbladder is surgically absent. Pancreas: Unremarkable. No pancreatic ductal dilatation or surrounding inflammatory changes. Spleen: Within normal limits. No focal lesion. Adrenals/Urinary Tract: Adrenal glands are unremarkable. No suspicious mass in right kidney. No right nephroureterolithiasis or obstructive uropathy. There is a 4 x 9 mm calculus in the region of left renal pelvis. There is associated resultant fullness in the left renal collecting system, not well evaluated on this exam. There is also dilated extrarenal pelvis. There is small amount of fluid posteroinferior to the left kidney (series 2, image 67), nonspecific but commonly seen as a sequela of focal calyceal rupture. Correlate clinically to determine the need for further imaging with contrast-enhanced study with delayed images. No left nephrolithiasis. Urinary bladder is minimally distended and appears within normal limits. Stomach/Bowel: Status post right hemicolectomy with ileocolonic anastomosis in the right lower quadrant. No disproportionate dilation of the small or large bowel loops. No evidence of abnormal bowel wall thickening or inflammatory changes. There are multiple diverticula mainly in the sigmoid  colon, without imaging signs of diverticulitis. Vascular/Lymphatic: No ascites or pneumoperitoneum. No abdominal or pelvic lymphadenopathy, by size criteria. No aneurysmal dilation of the major abdominal arteries. There are moderate peripheral atherosclerotic vascular calcifications of the aorta and its major branches. Reproductive: The uterus is unremarkable. No large adnexal mass. Other: The visualized soft tissues and abdominal wall are unremarkable. Musculoskeletal: No suspicious osseous lesions. Right hip arthroplasty noted. There are mild - moderate multilevel degenerative changes in the visualized spine. IMPRESSION: 1. No acute traumatic injury to the chest, abdomen or pelvis. 2. There is a 4 x 9 mm calculus in the region of left renal pelvis with associated fullness in the left renal collecting system, not well evaluated on this exam. There is also dilated extrarenal pelvis. There is small amount of fluid posteroinferior to the left kidney, nonspecific but commonly seen as a sequela of focal calyceal rupture. 3. Bilateral small-to-moderate pleural effusions with associated compressive atelectatic changes in the lungs. There are patchy areas of smooth interlobular and intra lobular septal thickening mainly in the bilateral lung apices along with marked cardiomegaly, highly concerning for congestive heart failure/pulmonary edema. 4. Multiple other nonacute observations (such as dilated main pulmonary trunk, prosthetic mitral valve, aortic valve calcifications, cirrhosis, stable peripheral triangular hypoattenuating area in the liver, status post right hemicolectomy, colonic diverticulosis, etc.), As described above. Aortic Atherosclerosis (ICD10-I70.0). Electronically Signed   By: Ree Molt M.D.   On: 02/23/2024 13:14   CT HEAD WO CONTRAST ( ) Result Date: 02/23/2024 EXAM: CT HEAD WITHOUT CONTRAST 02/23/2024 12:46:00 PM TECHNIQUE: CT of the head was performed without the administration of intravenous  contrast. Automated exposure control, iterative reconstruction, and/or weight based adjustment of the mA/kV was utilized to reduce the radiation dose to as low as reasonably achievable. COMPARISON: CT head 05/10/2022. CLINICAL HISTORY: Head trauma, minor (age >= 65 years). Fall with unknown down time. FINDINGS: The examination is mildly motion degraded despite repeat imaging. BRAIN AND VENTRICLES: There is no evidence of an acute infarct, intracranial hemorrhage, mass, midline shift, hydrocephalus, or extra-axial fluid collection. There is mild cerebral atrophy. Patchy hypodensities in the cerebral white matter bilaterally are similar to the prior CT and nonspecific but compatible with moderate chronic small vessel ischemic disease. There is likely a chronic lacunar infarct in the left thalamus. ORBITS: Bilateral cataract extraction. SINUSES: No acute abnormality. SOFT TISSUES AND SKULL: No acute soft tissue abnormality. Remote nasal bone fracture. No acute skull fracture. IMPRESSION: 1. No acute  intracranial abnormality. 2. Moderate chronic small vessel ischemic disease. 3. Cerebral Atrophy (ICD10-G31.9). Electronically signed by: Dasie Hamburg MD 02/23/2024 01:09 PM EST RP Workstation: [id number]   CT Cervical Spine Wo Contrast Result Date: 02/23/2024 EXAM: CT CERVICAL SPINE WITHOUT CONTRAST 02/23/2024 12:46:00 PM TECHNIQUE: CT of the cervical spine was performed without the administration of intravenous contrast. Multiplanar reformatted images are provided for review. Automated exposure control, iterative reconstruction, and/or weight based adjustment of the mA/kV was utilized to reduce the radiation dose to as low as reasonably achievable. COMPARISON: CT cervical spine 04/07/2022. CLINICAL HISTORY: Neck trauma (Age 35 years or older). Fall with unknown down time. Back pain. FINDINGS: The examination is mildly motion degraded. BONES AND ALIGNMENT: No acute fracture or traumatic malalignment. DEGENERATIVE CHANGES:  Moderate disc space narrowing and degenerative endplate changes from C3-C4 through C6-C7. Focally advanced facet arthrosis at C2-C3 on the left. No evidence of high grade spinal canal stenosis. SOFT TISSUES: No prevertebral soft tissue swelling. IMPRESSION: 1. No acute cervical spine fracture or traumatic malalignment. Electronically signed by: Dasie Hamburg MD 02/23/2024 01:06 PM EST RP Workstation: [id number]   DG Chest Portable 1 View Result Date: 02/23/2024 CLINICAL DATA:  LEFT hip pain status post fall EXAM: PORTABLE CHEST - 1 VIEW COMPARISON:  08/16/2023 FINDINGS: Unchanged marked cardiomegaly. Minimal pulmonary vascular congestion is present. Bibasilar opacities likely combination of atelectasis and small pleural effusions. Median sternotomy changes again seen. IMPRESSION: 1. Unchanged cardiomegaly. 2. Interval development of mild pulmonary vascular congestion and small BILATERAL pleural effusions. Electronically Signed   By: Aliene Lloyd M.D.   On: 02/23/2024 12:36   DG HIP UNILAT WITH PELVIS 2-3 VIEWS LEFT Result Date: 02/23/2024 CLINICAL DATA:  LEFT hip pain status post fall EXAM: DG HIP (WITH OR WITHOUT PELVIS) 2-3V LEFT COMPARISON:  None available FINDINGS: Visualized portions of the RIGHT total hip prosthesis are uncomplicated. Extensive Atherosclerotic changes seen throughout visualized arterial segments. No definite fracture or dislocation of the LEFT hip. Diffuse osteopenia is present. IMPRESSION: No acute fracture or dislocation of the LEFT hip. If the patient has continued difficulty bearing weight, further evaluation with CT or MRI should be performed. Electronically Signed   By: Aliene Lloyd M.D.   On: 02/23/2024 12:34      LOS: 1 day   Ole Bourdon, NP [facility] Pager: [phone]  02/24/2024, 12:36 PM  "

## 2024-02-24 NOTE — Assessment & Plan Note (Signed)
(  RESOLVED) 5.3 initially, down to 4.8 on 2/4 after heavy IV lasix .  Will continue to monitor. - AM CMP

## 2024-02-24 NOTE — Progress Notes (Signed)
"  °  Progress Note  [name] Date of Encounter: 02/24/2024 [facility] Cardiologist: Oneil Parchment, MD   Interval Summary   Mildly improved encephalopathy this morning. Received lasix  IV this AM. Minimally improved UOP.   Vital Signs Vitals:   02/24/24 0530 02/24/24 0600 02/24/24 0630 02/24/24 0700  BP: 95/62 94/66 95/75  96/69  Pulse: (!) 116 (!) 113 (!) 55 (!) 107  Resp: (!) 24 (!) 27 (!) 27 (!) 24  Temp:      TempSrc:      SpO2: 97% 97% 99% 98%  Weight:        Intake/Output Summary (Last 24 hours) at 02/24/2024 0839 Last data filed at 02/24/2024 0724 Gross per 24 hour  Intake 1178.94 ml  Output 700 ml  Net 478.94 ml      02/24/2024    4:00 AM 12/31/2023    8:34 AM 09/10/2023    9:02 AM  Last 3 Weights  Weight (lbs) 143 lb 11.8 oz 147 lb 147 lb  Weight (kg) 65.2 kg 66.679 kg 66.679 kg      Telemetry/ECG  A-fib tachycardic 110 range right bundle branch block- Personally Reviewed  Physical Exam  GEN: Ill-appearing, BiPAP Neck: No JVD Cardiac: Irregularly irregular mildly tachycardic, no murmurs, rubs, or gallops.  Respiratory: Bipap noise bilaterally. GI: Soft, nontender, non-distended  MS: No edema  Assessment & Plan   89 year old with infected left ureteral stone with cystoscopy stent placement, sepsis, atrial fibrillation with rapid ventricular response, right bundle branch block, acute respiratory failure, AKI, metabolic acidosis lactic acid elevated 2.2, now down to 1.5, cardiogenic shock.  Shock/A-fib RVR/mechanical mitral valve - Personally reviewed echocardiogram, EF appears in the 30% range inferolateral wall akinesis, mechanical mitral valve functioning well, RV dilatation noted, massively dilated right and left atrium. -Agree with stopping the IV diltiazem  from last night given the reduction in ejection fraction. - Heart rates are slightly improved and are reasonable given her current hypermetabolic state. If > 130 could consider IV  amiodarone . -Respiratory rate increased to approximately 40.  Slightly improved however from the mental aspect/encephalopathic aspect from last night. - Currently getting IV antibiotics for septic ureteral stone. -Currently on IV heparin , subtherapeutic, mechanical mitral valve. -Markedly decreased urine output likely from decreased cardiac output as well as concomitant distributive shock.  Guarded prognosis.   For questions or updates, please contact [facility] Please consult [email or web address] for contact info under        CRITICAL CARE TIME Performed by: Oneil Parchment, MD   Total critical care time: 40 minutes  Critical care time was exclusive of separately billable procedures and treating other patients.  Critical care was necessary to treat or prevent imminent or life-threatening deterioration.  Critical care was time spent personally by me on the following activities: development of treatment plan with patient and/or surrogate as well as nursing, discussions with consultants, evaluation of patient's response to treatment, examination of patient, obtaining history from patient or surrogate, ordering and performing treatments and interventions, ordering and review of laboratory studies, ordering and review of radiographic studies, pulse oximetry and re-evaluation of patient's condition. Signed, Oneil Parchment, MD   "

## 2024-02-24 NOTE — Assessment & Plan Note (Signed)
 Holding all other home meds until swallow eval is able to be completed.

## 2024-02-24 NOTE — Progress Notes (Signed)
 SLP Cancellation Note  Patient Details Name: [name] MRN: [id number] DOB: 1935/02/08   Cancelled treatment:       Reason Eval/Treat Not Completed: Medical issues which prohibited therapy;Other (comment) (on BiPAP. SLP will follow for readiness)   [name], MA, CCC-SLP Speech Therapy  02/24/2024, 2:52 PM

## 2024-02-24 NOTE — Progress Notes (Signed)
" °  Echocardiogram 2D Echocardiogram has been performed.  Devora Ellouise SAUNDERS 02/24/2024, 9:22 AM "

## 2024-02-24 NOTE — Plan of Care (Signed)
 FMTS Interim Progress Note  S: Doing alright, reports no pain.   O: BP 101/73 (BP Location: Left Wrist)   Pulse 82   Temp (!) 97.3 F (36.3 C) (Axillary)   Resp 19   Wt 65.2 kg   SpO2 100%   BMI 21.86 kg/m   General: Asleep, easily awoken by voice.  CV: Tachycardic, irregular rate and rhythm. Warm and well-perfused. 1+ pitting edema of BLE to shin level with evidence of sacral pitting edema as well.  Pulm: Breathing comfortably on 3L O2. No increased WOB. Few faint crackles appreciated, no wheezing.  Abd: Soft, non-tender, non-distended. Skin:  Warm, dry. Neuro: Alert and oriented x4. Aphasic speech.   A/P: Afib RVR HR 110-120s in room.  -Consider IV Amio if HR persistently >130  -Heparin  gtt -CTM   HFrEF Pulmonary HTN  Respiratory distress Transitioned off bipap, now breathing comfortably and satting well on [place].  -Fluid management per cardiology  -Bipap if respiratory status worsens  Kidney stone Bladder infection  Remains afebrile at this time.  -Continue Vanc and Zosyn  per pharm   AMS Appears more alert, was oriented x4.  -CTM -Delirium precautions  -Bedside swallow eval  -Consider brain MRI as clinical picture improves   Remainder of care plan per previous FMTS notes today.   Diona Perkins, MD 02/24/2024, 8:40 PM PGY-2, [facility] Medicine Service pager [phone]

## 2024-02-24 NOTE — Assessment & Plan Note (Signed)
 Unclear etiology.  Differential includes sepsis, stroke, and arrhythmia as above.  CT head showed no acute intracranial abnormality, moderate chronic small vessel ischemia, and cerebral atrophy. - MRI once patient has improved clinically, unable to complete at this time due to severity of illness - Delirium precautions - Neurochecks every 4 hours - Bedside swallow evaluation - NPO pending BSE

## 2024-02-24 NOTE — Progress Notes (Addendum)
 Bladder scan found urine 14 ml. Urine output less than 25-30 ml/hr, urine appears pink and cloudy through foley cath. Dr, Diona made aware.   Wendi Dash, RN

## 2024-02-24 NOTE — Progress Notes (Signed)
 PT Cancellation Note  Patient Details Name: [name] MRN: [id number] DOB: Sep 07, 1935   Cancelled Treatment:    Reason Eval/Treat Not Completed: Medical issues which prohibited therapy. Pt not medically stable for PT evaluation per RN. Will continue to follow and re-attempt as appropriate.   Kate ORN, PT, DPT Secure Chat Preferred  Rehab Office [phone]   Kate BRAVO [name] 02/24/2024, 7:38 AM

## 2024-02-24 NOTE — Progress Notes (Signed)
 ANTICOAGULATION CONSULT NOTE  Pharmacy Consult for Heparin  Indication: atrial fibrillation/St Jude MVR  Allergies[1]  Patient Measurements: Weight: 65.2 kg (143 lb 11.8 oz) Heparin  Dosing Weight: 65 kg  Vital Signs: Temp: 97.3 F (36.3 C) (02/04 1924) Temp Source: Axillary (02/04 1924) BP: 101/73 (02/04 1924) Pulse Rate: 82 (02/04 1924)  Labs: Recent Labs    02/23/24 1139 02/23/24 1145 02/23/24 1734 02/24/24 0000 02/24/24 0239 02/24/24 0832 02/24/24 1836  HGB 10.1* 11.9*  --   --  8.6*  --   --   HCT 35.9* 35.0*  --   --  30.7*  --   --   PLT 141*  --   --   --  108*  --   --   LABPROT 18.3*  --   --   --  21.3*  --   --   INR 1.4*  --   --   --  1.7*  --   --   HEPARINUNFRC  --   --   --   --   --  0.23* 0.21*  CREATININE 3.07* 3.30* 3.33* 3.34*  --  3.42*  --   CKTOTAL  --   --  140  --   --   --   --     Estimated Creatinine Clearance: 11.9 mL/min (A) (by C-G formula based on SCr of 3.42 mg/dL (H)).   Assessment: 6 yof with a medical history significant for atrial fibrillation and history of mMVR on warfarin PTA (INR goal 2.5-3.5). Per [facility] clinic note, PTA warfarin regimen is 2.5 mg MWF, 5 mg all other days. Patient now s/p cystoscopy and ureteral stent placement. Pharmacy consulted to manage heparin  while warfarin is held.  2/4 PM: Heparin  level 0.21 below goal at current rate 1150 units/hr. No issues with infusion or s/sx bleeding reported.  Goal of Therapy:  Heparin  level 0.3-0.7 units/ml Monitor platelets by anticoagulation protocol: Yes   Plan:   Heparin  1000 units bolus x1 Increase heparin  infusion to 1300 units/hr Check heparin  level with AM labs and daily while on heparin  Continue to monitor H&H and platelets  Thank you for allowing pharmacy to be a part of this patients care.  Larraine Brazier, PharmD Clinical Pharmacist 02/24/2024  8:07 PM **Pharmacist phone directory can now be found on [email or web address] ([email or web address]).  Listed under [facility].        [1]  Allergies Allergen Reactions   Iodinated Contrast Media Rash    Pt stated in the past had broken out in a rash on back and itching.

## 2024-02-24 NOTE — Progress Notes (Signed)
 After the procedure, Pt transferred from PACU to [id number]. She responds to voice, opens her eyes. She is drowsy, disoriented x 4, incomprehensible speech, unable to follow commands. Her EKG is Atrial fib with RVR, soft BP, tachypnea, RR 32-35 on 3 LPM with red MEWS scores at arrival. On Diltiazem  10 mg/hr gtt. Pt has low urine output. Dr. Diona is at bedside. Multiple orders received. Plan of care is reviewed. We will continue to monitor.    02/23/24 2055  Assess: MEWS Score  Temp 98 F (36.7 C)  BP 102/67  MAP (mmHg) 79  Pulse Rate (!) 110  ECG Heart Rate (!) 114  Resp (!) 32  Level of Consciousness Responds to Voice  SpO2 95 %  O2 Device Nasal Cannula  Patient Activity (if Appropriate) In bed  O2 Flow Rate (L/min) 3 L/min  Assess: MEWS Score  MEWS Temp 0  MEWS Systolic 0  MEWS Pulse 2  MEWS RR 2  MEWS LOC 1  MEWS Score 5  MEWS Score Color Red  Assess: if the MEWS score is Yellow or Red  Were vital signs accurate and taken at a resting state? Yes  Does the patient meet 2 or more of the SIRS criteria? Yes  Does the patient have a confirmed or suspected source of infection? Yes  MEWS guidelines implemented  Yes, red  Treat  MEWS Interventions Considered administering scheduled or prn medications/treatments as ordered  Take Vital Signs  Increase Vital Sign Frequency  Red: Q1hr x2, continue Q4hrs until patient remains green for 12hrs  Escalate  MEWS: Escalate Red: Discuss with charge nurse and notify provider. Consider notifying RRT. If remains red for 2 hours consider need for higher level of care  Notify: Charge Nurse/RN  Name of Charge Nurse/RN Notified Tami Reilly, RN  Provider Notification  Provider Name/Title Dr. Diona Perkins  Date Provider Notified 02/23/24  Time Provider Notified 2055  Method of Notification Face-to-face;Rounds  Notification Reason Change in status  Provider response Evaluate remotely;See new orders  Date of Provider Response 02/23/24  Time of  Provider Response 2100  Assess: SIRS CRITERIA  SIRS Temperature  0  SIRS Respirations  1  SIRS Pulse 1  SIRS WBC 0  SIRS Score Sum  2   Tami Dash, RN

## 2024-02-24 NOTE — Assessment & Plan Note (Signed)
 Patient in A-fib with RVR with rates in the 140s as well as wide complex tachycardia. Home medication diltiazem  180 mg daily.  Initial troponin 63, repeat 70.  EKG showed tachycardic, A-fib, RBBB.  Patient has known RBBB. Suspect partly d/t pain and infection.  - Cardiology consulted, appreciate recommendations  - Holding Dilt and Amio  - Can restart IV Amio if HR >130 - Heparin  GTT - Magnesium  - Cardiac telemetry indefinitely

## 2024-02-24 NOTE — Progress Notes (Signed)
 Patient sustaining heart rate between 120-130.  Current blood pressure 114/6.  Zane Haley PA notified via telephone.

## 2024-02-24 NOTE — Plan of Care (Signed)
 After total of 180 IV Lasix  given, patient with continued minimal UOP. Per cardiology recommendation, will proceed with IV diuril  at this time. Spoke with pharmacy and confirmed 500 IV diuril  dosing with kidney function and placed order accordingly. Will CTM UOP.

## 2024-02-24 NOTE — Progress Notes (Signed)
 ANTICOAGULATION CONSULT NOTE  Pharmacy Consult for Heparin  Indication: atrial fibrillation/St Jude MVR  Allergies[1]  Patient Measurements: Weight: 65.2 kg (143 lb 11.8 oz) Heparin  Dosing Weight: 65 kg  Vital Signs: Temp: 97.5 F (36.4 C) (02/04 0400) Temp Source: Axillary (02/04 0400) BP: 114/76 (02/04 0800) Pulse Rate: 54 (02/04 0800)  Labs: Recent Labs    02/23/24 1139 02/23/24 1145 02/23/24 1734 02/24/24 0000 02/24/24 0239 02/24/24 0832  HGB 10.1* 11.9*  --   --  8.6*  --   HCT 35.9* 35.0*  --   --  30.7*  --   PLT 141*  --   --   --  108*  --   LABPROT 18.3*  --   --   --  21.3*  --   INR 1.4*  --   --   --  1.7*  --   HEPARINUNFRC  --   --   --   --   --  0.23*  CREATININE 3.07* 3.30* 3.33* 3.34*  --  3.42*  CKTOTAL  --   --  140  --   --   --     Estimated Creatinine Clearance: 11.9 mL/min (A) (by C-G formula based on SCr of 3.42 mg/dL (H)).   Assessment: 24 yof with a medical history significant for atrial fibrillation and history of mMVR on warfarin PTA (INR goal 2.5-3.5). Per [facility] clinic note, PTA warfarin regimen is 2.5 mg MWF, 5 mg all other days. Patient now s/p cystoscopy and ureteral stent placement. Pharmacy consulted to manage heparin  while warfarin is held.  Heparin  level below goal at current rate 1000 units/hr. No issues with infusion or s/sx bleeding reported.  Goal of Therapy:  Heparin  level 0.3-0.7 units/ml Monitor platelets by anticoagulation protocol: Yes   Plan:   Heparin  1000 units bolus x1 Increase heparin  infusion to 1150 units/hr Check heparin  level in 8 hours and daily while on heparin  Continue to monitor H&H and platelets  Thank you for allowing pharmacy to be a part of this patients care.  Shelba Collier, PharmD, BCPS Clinical Pharmacist    [1]  Allergies Allergen Reactions   Iodinated Contrast Media Rash    Pt stated in the past had broken out in a rash on back and itching.

## 2024-02-25 ENCOUNTER — Encounter (HOSPITAL_COMMUNITY): Payer: Self-pay | Admitting: Family Medicine

## 2024-02-25 DIAGNOSIS — I5043 Acute on chronic combined systolic (congestive) and diastolic (congestive) heart failure: Secondary | ICD-10-CM

## 2024-02-25 DIAGNOSIS — R410 Disorientation, unspecified: Secondary | ICD-10-CM

## 2024-02-25 DIAGNOSIS — Z7901 Long term (current) use of anticoagulants: Secondary | ICD-10-CM

## 2024-02-25 DIAGNOSIS — N390 Urinary tract infection, site not specified: Secondary | ICD-10-CM

## 2024-02-25 DIAGNOSIS — A419 Sepsis, unspecified organism: Secondary | ICD-10-CM | POA: Diagnosis not present

## 2024-02-25 DIAGNOSIS — B964 Proteus (mirabilis) (morganii) as the cause of diseases classified elsewhere: Secondary | ICD-10-CM

## 2024-02-25 DIAGNOSIS — R570 Cardiogenic shock: Secondary | ICD-10-CM

## 2024-02-25 DIAGNOSIS — N3090 Cystitis, unspecified without hematuria: Secondary | ICD-10-CM

## 2024-02-25 DIAGNOSIS — I959 Hypotension, unspecified: Secondary | ICD-10-CM | POA: Diagnosis present

## 2024-02-25 DIAGNOSIS — I482 Chronic atrial fibrillation, unspecified: Secondary | ICD-10-CM

## 2024-02-25 DIAGNOSIS — I350 Nonrheumatic aortic (valve) stenosis: Secondary | ICD-10-CM

## 2024-02-25 DIAGNOSIS — R471 Dysarthria and anarthria: Secondary | ICD-10-CM | POA: Insufficient documentation

## 2024-02-25 DIAGNOSIS — I050 Rheumatic mitral stenosis: Secondary | ICD-10-CM | POA: Diagnosis present

## 2024-02-25 DIAGNOSIS — N184 Chronic kidney disease, stage 4 (severe): Secondary | ICD-10-CM

## 2024-02-25 DIAGNOSIS — I2729 Other secondary pulmonary hypertension: Secondary | ICD-10-CM

## 2024-02-25 DIAGNOSIS — N179 Acute kidney failure, unspecified: Secondary | ICD-10-CM | POA: Diagnosis not present

## 2024-02-25 DIAGNOSIS — I50810 Right heart failure, unspecified: Secondary | ICD-10-CM

## 2024-02-25 DIAGNOSIS — N200 Calculus of kidney: Secondary | ICD-10-CM | POA: Diagnosis not present

## 2024-02-25 DIAGNOSIS — N3000 Acute cystitis without hematuria: Secondary | ICD-10-CM

## 2024-02-25 LAB — COMPREHENSIVE METABOLIC PANEL WITH GFR
ALT: 8 U/L (ref 0–44)
AST: 20 U/L (ref 15–41)
Albumin: 2.8 g/dL — ABNORMAL LOW (ref 3.5–5.0)
Alkaline Phosphatase: 45 U/L (ref 38–126)
Anion gap: 11 (ref 5–15)
BUN: 70 mg/dL — ABNORMAL HIGH (ref 8–23)
CO2: 23 mmol/L (ref 22–32)
Calcium: 8.6 mg/dL — ABNORMAL LOW (ref 8.9–10.3)
Chloride: 110 mmol/L (ref 98–111)
Creatinine, Ser: 3.71 mg/dL — ABNORMAL HIGH (ref 0.44–1.00)
GFR, Estimated: 11 mL/min — ABNORMAL LOW
Glucose, Bld: 61 mg/dL — ABNORMAL LOW (ref 70–99)
Potassium: 4.6 mmol/L (ref 3.5–5.1)
Sodium: 146 mmol/L — ABNORMAL HIGH (ref 135–145)
Total Bilirubin: 0.9 mg/dL (ref 0.0–1.2)
Total Protein: 7.3 g/dL (ref 6.5–8.1)

## 2024-02-25 LAB — GLUCOSE, CAPILLARY
Glucose-Capillary: 121 mg/dL — ABNORMAL HIGH (ref 70–99)
Glucose-Capillary: 73 mg/dL (ref 70–99)
Glucose-Capillary: 75 mg/dL (ref 70–99)
Glucose-Capillary: 78 mg/dL (ref 70–99)

## 2024-02-25 LAB — CBC
HCT: 33.4 % — ABNORMAL LOW (ref 36.0–46.0)
Hemoglobin: 9.5 g/dL — ABNORMAL LOW (ref 12.0–15.0)
MCH: 25.6 pg — ABNORMAL LOW (ref 26.0–34.0)
MCHC: 28.4 g/dL — ABNORMAL LOW (ref 30.0–36.0)
MCV: 90 fL (ref 80.0–100.0)
Platelets: 107 10*3/uL — ABNORMAL LOW (ref 150–400)
RBC: 3.71 MIL/uL — ABNORMAL LOW (ref 3.87–5.11)
RDW: 20 % — ABNORMAL HIGH (ref 11.5–15.5)
WBC: 9.9 10*3/uL (ref 4.0–10.5)
nRBC: 0 % (ref 0.0–0.2)

## 2024-02-25 LAB — URINE CULTURE: Culture: >=100000 — AB

## 2024-02-25 LAB — PROTIME-INR
INR: 1.6 — ABNORMAL HIGH (ref 0.8–1.2)
Prothrombin Time: 19.8 s — ABNORMAL HIGH (ref 11.4–15.2)

## 2024-02-25 LAB — PRO BRAIN NATRIURETIC PEPTIDE: Pro Brain Natriuretic Peptide: >35000 pg/mL — ABNORMAL HIGH

## 2024-02-25 LAB — PHOSPHORUS: Phosphorus: 4.9 mg/dL — ABNORMAL HIGH (ref 2.5–4.6)

## 2024-02-25 LAB — HEPARIN LEVEL (UNFRACTIONATED): Heparin Unfractionated: 0.39 [IU]/mL (ref 0.30–0.70)

## 2024-02-25 LAB — MAGNESIUM: Magnesium: 2.5 mg/dL — ABNORMAL HIGH (ref 1.7–2.4)

## 2024-02-25 MED ORDER — AMIODARONE HCL IN DEXTROSE 360-4.14 MG/200ML-% IV SOLN
60.0000 mg/h | INTRAVENOUS | Status: AC
  Administered 2024-02-25 (×2): 60 mg/h via INTRAVENOUS
  Filled 2024-02-25: qty 200

## 2024-02-25 MED ORDER — AMIODARONE HCL IN DEXTROSE 360-4.14 MG/200ML-% IV SOLN
30.0000 mg/h | INTRAVENOUS | Status: AC
  Administered 2024-02-25: 30 mg/h via INTRAVENOUS
  Filled 2024-02-25 (×4): qty 200

## 2024-02-25 MED ORDER — AMIODARONE LOAD VIA INFUSION
150.0000 mg | Freq: Once | INTRAVENOUS | Status: AC
  Administered 2024-02-25: 150 mg via INTRAVENOUS
  Filled 2024-02-25: qty 83.34

## 2024-02-25 MED ORDER — SODIUM CHLORIDE 0.9 % IV SOLN
2.0000 g | INTRAVENOUS | Status: AC
  Administered 2024-02-25 – 2024-02-26 (×2): 2 g via INTRAVENOUS
  Filled 2024-02-25 (×2): qty 20

## 2024-02-25 MED ORDER — HYDROMORPHONE HCL 1 MG/ML IJ SOLN
0.5000 mg | Freq: Once | INTRAMUSCULAR | Status: AC
  Administered 2024-02-25: 0.5 mg via INTRAVENOUS
  Filled 2024-02-25: qty 1

## 2024-02-25 MED ORDER — DEXTROSE 50 % IV SOLN
12.5000 g | Freq: Once | INTRAVENOUS | Status: AC
  Administered 2024-02-25: 12.5 g via INTRAVENOUS
  Filled 2024-02-25: qty 50

## 2024-02-25 MED ORDER — SODIUM CHLORIDE 0.9 % IV BOLUS
500.0000 mL | Freq: Once | INTRAVENOUS | Status: AC
  Administered 2024-02-25: 500 mL via INTRAVENOUS

## 2024-02-25 MED ORDER — ACETAMINOPHEN 325 MG PO TABS
650.0000 mg | ORAL_TABLET | Freq: Four times a day (QID) | ORAL | Status: AC
  Filled 2024-02-25 (×2): qty 2

## 2024-02-25 NOTE — Plan of Care (Signed)
 FMTS Interim Progress Note  S: Paged by nursing for maps <60, given 500 cc bolus. At bedside, patient is A&Ox3, though dysarthric and hard of hearing. Had discussion regarding her current prognosis and concern that she may need inotropes if her BP continues to be low and does not respond to fluids. Patient seemed to understand this and became teary-eyed. Patient expressed understanding and would like to continue to pursue aggressive care.    O: BP (!) 69/53 Comment: MD aware  Pulse (!) 123   Temp (!) 97.5 F (36.4 C) (Oral)   Resp 18   Wt 64.9 kg   SpO2 90%   BMI 21.76 kg/m   General: A&O, frail, ill appearing, hard of hearing  HEENT: No sign of trauma Cardiac: irregularly irregular tachycardia  Respiratory: difficult to assess, some increased WOB on 3L, Sat > 90%  GI: Soft, NTTP, non-distended  Extremities: difficult to assess volume status, no TTP  Neuro:responds to commands, no focal neurological deficits   A/P:  Urosepsis A fib w RVR CHF BP continues to be soft with maps in the 50-60s. Patients volume status continues to be labile. Thought to be volume down and required bolus on admission, but was subsequently heavily diuresed for concern of volume overload 2/2 CHF excerebration. Recent Echo shows EF 30-35% with hypertrophy and dilation of LV. RV function also reduced with RV enlargement. Patient also has St Jude mitral valve replacement. Discussed her prognosis with Dr. Jeffrie, Cardiology, who agreed that patient could receive small bolus to see if this assists with MAP. Discussed her care with daughter, [name], who expressed wanting to escalate care to ICU if needed. Paged CCM to make them aware of patient, they plan to see.  - s/p 500 cc bolus - continue amio gtt - CCM, pulse ox - SpO2 >90%, O2 prn  - CCM consulted, appreciate recs   Chanelle Hodsdon, MD 02/25/2024, 3:44 PM PGY-2, [facility] Medicine Service pager [phone]

## 2024-02-25 NOTE — Assessment & Plan Note (Addendum)
 Holding all other home meds until swallow eval is able to be completed.

## 2024-02-25 NOTE — Assessment & Plan Note (Addendum)
 Hypothermic to 96.4 Fahrenheit, tachycardic to 150s, tachypneic to 40s, bladder is likley source. CTAP showed 4X9 mm calculus in left renal pelvis region and dilated extrarenal pelvis and small posterior inferior fluid, possibly 2/2 sequelae of focal calyceal rupture. VBG: pCO2 42, Lactic Acid 1.5, Bladder scans have shown minimal urine, suggesting pre-renal etiology for low urine output. Cr. Increased to 3.71 from 3.07 on admission. - Urology consulted, appreciate recs  - Stopping Vancomycin  - Continue Zosyn  - Hydrating as able against heart failure - Blood culture x 2, pending - AM CBC, CMP - Consult Palliative care, appreciate recs

## 2024-02-25 NOTE — Consult Note (Addendum)
 "  NAME:  [name], MRN:  [id number], DOB:  Mar 10, 1935, LOS: 2 ADMISSION DATE:  02/23/2024, CONSULTATION DATE:  02/25/24 REFERRING MD:  Dr. McDiarmid, CHIEF COMPLAINT:  hypotension   History of Present Illness:  [name] is a 89 y.o. female with PMH of combined systolic and diastolic congestive heart failure with EF 30-35%, permanent atrial fibrillation, pulmonary hypertension, moderate AV stenosis who presented to the ED on 2/3 for an unwitnessed fall at home, confusion. Found to be in AFIB RVR, with a urinary tract infection, CT A&P with left renal stone with forniceal rupture. Urology consulted and left ureteral stent was placed 2/3. Cardiology is following for AFIB RVR and acute on chronic combined CHF. Patient has been intermittently diuresed, now with intermittent hypotension and has been receiving fluid boluses. Patient has diuresed with negative fluid balance.   Blood pressure has been fluctuating 70-90s systolic the past 24 hours, at time of consult > 100 systolic with MAP 60-65. Patient is awake and alert, talking. Urine culture growing proteus, currently on zosyn  with intermediate sensitivity. Will switch to Rocephin . Continue to monitor BP, would avoid aggressive fluid resuscitation due to CHF and hold off diuretics per cardiology.   Pertinent  Medical History   has a past medical history of Asthma, Chronic atrial fibrillation (HCC), Dilated cardiomyopathy (HCC), Dysphagia, Fracture of tibial plateau (05/21/2015), GERD (gastroesophageal reflux disease), GI bleed, Hemorrhoid (04/2014), History of blood transfusion, Hypertension, Microcytic anemia, Nonsustained ventricular tachycardia (HCC), Osteoporosis, Protein in urine, RBBB (right bundle branch block), and Rheumatic mitral valve disease.   Significant Hospital Events: Including procedures, antibiotic start and stop dates in addition to other pertinent events   2/5: PCCM consulted for hypotension  Interim History / Subjective:   See HPI  Objective    Blood pressure (!) 82/58, pulse (!) 114, temperature 97.7 F (36.5 C), temperature source Oral, resp. rate 18, weight 64.9 kg, SpO2 100%.        Intake/Output Summary (Last 24 hours) at 02/25/2024 1655 Last data filed at 02/25/2024 0640 Gross per 24 hour  Intake 534.53 ml  Output 800 ml  Net -265.47 ml   Filed Weights   02/24/24 0400 02/25/24 0345  Weight: 65.2 kg 64.9 kg    Examination: Physical Exam Vitals and nursing note reviewed.  Constitutional:      Comments: Frail, elderly, chronically ill appearing female.  HENT:     Head: Normocephalic.     Right Ear: External ear normal.     Left Ear: External ear normal.     Nose: Nose normal.  Eyes:     Pupils: Pupils are equal, round, and reactive to light.  Cardiovascular:     Rate and Rhythm: Tachycardia present. Rhythm irregular.  Pulmonary:     Effort: Pulmonary effort is normal. No respiratory distress.     Breath sounds: Normal breath sounds.  Abdominal:     General: Abdomen is flat.     Palpations: Abdomen is soft.  Musculoskeletal:     Right lower leg: No edema.     Left lower leg: No edema.  Skin:    General: Skin is warm and dry.  Neurological:     Mental Status: She is alert.      Resolved problem list   Assessment and Plan   Intermittent Hypotension Sepsis secondary to proteus urinary tract infection Left obstructing ureteral stone s/p stent placement 02/23/24 -Urology following -Urine culture: Proteus. Has been on Zosyn  w/ intermittent sensitivity, will switch to Rocephin  2g  q24h for better treatment coverage -BP >100 systolic on evaluation, MAP > 39-34, patient awake and alert. Continue to monitor, would hold off on further IV fluid due to combined heart failure, and hold off on further diuretics for now -If patient declines or further questions, re-consult CCM  Atrial Fibrillation with RVR Combined Systolic and Diastolic congestive heart failure (EF 30-35%) -Cardiology  following -On amiodarone  infusion    Labs   CBC: Recent Labs  Lab 02/23/24 1139 02/23/24 1145 02/24/24 0239 02/25/24 0211  WBC 8.3  --  8.2 9.9  NEUTROABS 7.1  --   --   --   HGB 10.1* 11.9* 8.6* 9.5*  HCT 35.9* 35.0* 30.7* 33.4*  MCV 91.6  --  90.0 90.0  PLT 141*  --  108* 107*    Basic Metabolic Panel: Recent Labs  Lab 02/23/24 1139 02/23/24 1145 02/23/24 1734 02/24/24 0000 02/24/24 0832 02/25/24 0211  NA 143 146* 144 142 143 146*  K 5.3* 5.2* 5.6* 5.3* 4.8 4.6  CL 109 116* 109 109 109 110  CO2 18*  --  21* 21* 21* 23  GLUCOSE 96 97 85 87 77 61*  BUN 57* 55* 62* 63* 66* 70*  CREATININE 3.07* 3.30* 3.33* 3.34* 3.42* 3.71*  CALCIUM 9.1  --  9.2 8.6* 8.6* 8.6*  MG  --   --  2.6*  --  2.5* 2.5*  PHOS  --   --   --   --  4.2 4.9*   GFR: Estimated Creatinine Clearance: 11 mL/min (A) (by C-G formula based on SCr of 3.71 mg/dL (H)). Recent Labs  Lab 02/23/24 1139 02/23/24 1745 02/24/24 0015 02/24/24 0239 02/25/24 0211  WBC 8.3  --   --  8.2 9.9  LATICACIDVEN  --  2.2* 1.5  --   --     Liver Function Tests: Recent Labs  Lab 02/23/24 1139 02/24/24 0000 02/25/24 0211  AST 26 23 20   ALT 5 8 8   ALKPHOS 51 49 45  BILITOT 1.0 1.0 0.9  PROT 9.0* 7.9 7.3  ALBUMIN 3.3* 2.9* 2.8*   No results for input(s): LIPASE, AMYLASE in the last 168 hours. No results for input(s): AMMONIA in the last 168 hours.  ABG    Component Value Date/Time   HCO3 23.7 02/24/2024 0951   TCO2 19 (L) 02/23/2024 1145   ACIDBASEDEF 2.0 02/24/2024 0951   O2SAT 60.3 02/24/2024 0951     Coagulation Profile: Recent Labs  Lab 02/23/24 1139 02/24/24 0239 02/25/24 0211  INR 1.4* 1.7* 1.6*    Cardiac Enzymes: Recent Labs  Lab 02/23/24 1734  CKTOTAL 140    HbA1C: Hgb A1c MFr Bld  Date/Time Value Ref Range Status  07/25/2020 04:51 PM 6.2 (H) 4.8 - 5.6 % Final    Comment:    (NOTE) Pre diabetes:          5.7%-6.4%  Diabetes:              >6.4%  Glycemic control  for   <7.0% adults with diabetes   06/02/2015 11:54 AM 5.8 (H) 4.8 - 5.6 % Final    Comment:    (NOTE)         Pre-diabetes: 5.7 - 6.4         Diabetes: >6.4         Glycemic control for adults with diabetes: <7.0     CBG: Recent Labs  Lab 02/23/24 2115 02/25/24 0911 02/25/24 1539  GLUCAP 90 121* 78  Review of Systems:   See HPI  Past Medical History:  She,  has a past medical history of Asthma, Chronic atrial fibrillation (HCC), Dilated cardiomyopathy (HCC), Dysphagia, Fracture of tibial plateau (05/21/2015), GERD (gastroesophageal reflux disease), GI bleed, Hemorrhoid (04/2014), History of blood transfusion, Hypertension, Microcytic anemia, Nonsustained ventricular tachycardia (HCC), Osteoporosis, Protein in urine, RBBB (right bundle branch block), and Rheumatic mitral valve disease.   Surgical History:   Past Surgical History:  Procedure Laterality Date   CHOLECYSTECTOMY  2005   COLONOSCOPY     COLONOSCOPY W/ POLYPECTOMY     CYSTOSCOPY WITH STENT PLACEMENT Left 02/23/2024   Procedure: CYSTOSCOPY, WITH STENT INSERTION;  Surgeon: Cam Morene ORN, MD;  Location: [facility] OR;  Service: Urology;  Laterality: Left;   EYE SURGERY  04/2014   HEMICOLECTOMY  2008   forpolyps   MITOMYCIN  C APPLICATION Right 05/17/2014   Procedure: MITOMYCIN  C APPLICATION;  Surgeon: Gaither Quan, MD;  Location: [facility] OR;  Service: Ophthalmology;  Laterality: Right;   MITRAL VALVE REPLACEMENT  1987   St Jude mechanical valve   RIGHT/LEFT HEART CATH AND CORONARY ANGIOGRAPHY N/A 04/02/2021   Procedure: RIGHT/LEFT HEART CATH AND CORONARY ANGIOGRAPHY;  Surgeon: Cherrie Toribio SAUNDERS, MD;  Location: [facility] INVASIVE CV LAB;  Service: Cardiovascular;  Laterality: N/A;   TOTAL HIP ARTHROPLASTY Right 04/08/2022   Procedure: TOTAL HIP ARTHROPLASTY;  Surgeon: Beverley Evalene BIRCH, MD;  Location: [facility] OR;  Service: Orthopedics;  Laterality: Right;   TRABECULECTOMY Right 05/17/2014   Procedure: TRABECULECTOMY WITH  MITOMYCIN  RIGHT EYE;  Surgeon: Gaither Quan, MD;  Location: [facility] OR;  Service: Ophthalmology;  Laterality: Right;   TUBAL LIGATION     US  ECHOCARDIOGRAPHY  02/2008, 03/2006, 06/2004     Social History:   reports that she quit smoking about 39 years ago. Her smoking use included cigarettes. She started smoking about 54 years ago. She has a 22.5 pack-year smoking history. She has never used smokeless tobacco. She reports that she does not drink alcohol  and does not use drugs.   Family History:  Her family history includes Blindness in her sister; Cancer in her father and sister; Endometriosis in her daughter; Hypertension in her daughter, daughter, daughter, mother, and sister; Liver disease in her daughter; Stroke in her mother and sister; Thyroid  disease in her daughter. There is no history of Colon cancer or Breast cancer.   Allergies Allergies[1]   Home Medications  Prior to Admission medications  Medication Sig Start Date End Date Taking? Authorizing Provider  hydrALAZINE  (APRESOLINE ) 10 MG tablet Take 10 mg by mouth 3 (three) times daily. 01/01/24  Yes [provider]  metoprolol  succinate (TOPROL -XL) 100 MG 24 hr tablet Take 100 mg by mouth daily. 01/04/24  Yes [provider]  tamsulosin  (FLOMAX ) 0.4 MG CAPS capsule Take by mouth daily. 01/03/24  Yes [provider]  acetaminophen  (TYLENOL ) 325 MG tablet Take 2 tablets (650 mg total) by mouth every 6 (six) hours as needed for mild pain (pain score 1-3) or headache. 08/20/23   Shitarev, Dimitry, MD  Cholecalciferol  (VITAMIN D ) 2000 units tablet Take 1 tablet (2,000 Units total) by mouth daily. 06/10/17   [name] Camie CROME, MD  diltiazem  (CARDIZEM  CD) 180 MG 24 hr capsule Take 1 capsule (180 mg total) by mouth daily. 09/17/23   [name] Camie CROME, MD  dorzolamidel-timolol  (COSOPT ) 22.3-6.8 MG/ML SOLN ophthalmic solution Place 1 drop into both eyes in the morning and at bedtime. 03/02/17   [provider]  fluticasone   (FLONASE ) 50  MCG/ACT nasal spray Spray one spray into each nostril twice a day 08/24/23   [name] Camie CROME, MD  furosemide  (LASIX ) 40 MG tablet Take 1 tablet (40 mg total) by mouth daily. 09/17/23   [name] Camie CROME, MD  LUMIGAN 0.01 % SOLN Place 1 drop into both eyes at bedtime. 01/26/17   [provider]  metoprolol  tartrate (LOPRESSOR ) 100 MG tablet Take 1 tablet (100 mg total) by mouth 2 (two) times daily. 09/17/23 12/31/23  [name] Camie CROME, MD  pantoprazole  (PROTONIX ) 40 MG tablet Take 1 tablet (40 mg total) by mouth daily. 12/03/23   [name] Camie CROME, MD  warfarin (COUMADIN ) 5 MG tablet TAKE 1 TO 2 TABLETS BY MOUTH DAILY AS DIRECTED BY COUMADIN  CLINIC 12/01/23   Waddell Danelle ORN, MD          Keven Fila, PA-C [place] [facility] For pager details, please see AMION or use Epic chat  After 1900, please call [facility] for cross coverage needs 02/25/2024, 4:55 PM      [1]  Allergies Allergen Reactions   Iodinated Contrast Media Rash    Pt stated in the past had broken out in a rash on back and itching.    "

## 2024-02-25 NOTE — Assessment & Plan Note (Addendum)
 Patient in A-fib with RVR with rates in the 140s as well as wide complex tachycardia. Home medication diltiazem  180 mg daily.  Initial troponin 63, repeat 70.  EKG showed tachycardic, A-fib, RBBB.  Patient has known RBBB. Suspect partly d/t pain and infection.  - Cardiology consulted, appreciate recommendations  - Holding Dilt and Amio  - Can restart IV Amio if HR >130 - Heparin  GTT - Magnesium  - Cardiac telemetry indefinitely

## 2024-02-25 NOTE — Progress Notes (Signed)
 PT Cancellation Note  Patient Details Name: [name] MRN: [id number] DOB: Aug 28, 1935   Cancelled Treatment:    Reason Eval/Treat Not Completed: Medical issues which prohibited therapy. Pt in a fib and tachy at rest, also hypotensive. Discussed with RN, potentially starting on amio. Will hold and re-attempt as medically appropriate.   Kate ORN, PT, DPT Secure Chat Preferred  Rehab Office [phone]    Kate BRAVO [name] 02/25/2024, 9:48 AM

## 2024-02-25 NOTE — Anesthesia Postprocedure Evaluation (Signed)
"   Anesthesia Post Note  Patient: RASHEDA LEDGER  Procedure(s) Performed: CYSTOSCOPY, WITH STENT INSERTION (Left)     Patient location during evaluation: PACU Anesthesia Type: MAC Level of consciousness: awake and alert Pain management: pain level controlled Vital Signs Assessment: post-procedure vital signs reviewed and stable Respiratory status: spontaneous breathing, nonlabored ventilation, respiratory function stable and patient connected to nasal cannula oxygen  Cardiovascular status: stable and blood pressure returned to baseline Postop Assessment: no apparent nausea or vomiting Anesthetic complications: no   No notable events documented.  Last Vitals:  Vitals:   02/25/24 0344 02/25/24 0507  BP: (!) 87/76 98/62  Pulse: (!) 33 (!) 57  Resp: 20   Temp: 36.5 C   SpO2: 97% 91%    Last Pain:  Vitals:   02/25/24 0507  TempSrc:   PainSc: 0-No pain                 Adithya Difrancesco S      "

## 2024-02-25 NOTE — Assessment & Plan Note (Addendum)
 Vascular congestion and small plueral effusion seen on CXR. Echo showed EF 30-35%, increased atrial pressures, tricuspid regurgitation. Pt given heavy diuresis night of 02/23/2024 with ~1L urine output.  - BiPAP - Strict I&Os - Daily weights - Monitoring closely in case more diuresis is needed

## 2024-02-25 NOTE — Assessment & Plan Note (Signed)
 Likely new from admission.  - MRI Brain w/o contrast - Swallow Eval, patient is hungry and able to voice this

## 2024-02-25 NOTE — Progress Notes (Signed)
 "  2 Days Post-Op Subjective: Pt still unresponsive. Ongoing A. Fib w/ rvr, worsened overnight.   Objective: Vital signs in last 24 hours: Temp:  [97.3 F (36.3 C)-97.7 F (36.5 C)] 97.5 F (36.4 C) (02/05 0712) Pulse Rate:  [33-108] 88 (02/05 0712) Resp:  [18-29] 18 (02/05 0712) BP: (82-105)/(56-76) 87/56 (02/05 0712) SpO2:  [91 %-100 %] 100 % (02/05 0712) Weight:  [64.9 kg] 64.9 kg (02/05 0345)  Assessment/Plan: # Sepsis # Cardiogenic shock- EF 30% # Left ureteral stone # gross hematuria   To the OR with Dr. Cam on 02/23/2024 for urgent left ureteral stent placement.    Soft BP with worsening A.fib w/ RVR, occasional bradycardia   Trend labs, SCr 3.42-->3.71, slightly above baseline of around 3.0. presumably cardiac etiology   Definitive stone management on an outpatient basis when she is cleared her infection.   51f Foley catheter in place to facilitate drainage.  May remove after patient has been fever free for 24 hours.  Mild gross hematuria developed with heparin  gtt and foley. Nursing to hand irrigate Q6 and PRN.  Consider higher level of care.    Guarded prognosis  Intake/Output from previous day: 02/04 0701 - 02/05 0700 In: 650.5 [I.V.:384.5; IV Piggyback:266] Out: 1600 [Urine:1600]  Intake/Output this shift: No intake/output data recorded.  Physical Exam:  General: Alert and oriented CV: No cyanosis Lungs: equal chest rise Gu: 33f silicone foley in place with blood tinged urine.   Lab Results: Recent Labs    02/23/24 1145 02/24/24 0239 02/25/24 0211  HGB 11.9* 8.6* 9.5*  HCT 35.0* 30.7* 33.4*   BMET Recent Labs    02/24/24 0239 02/24/24 0832 02/25/24 0211  NA  --  143 146*  K  --  4.8 4.6  CL  --  109 110  CO2  --  21* 23  GLUCOSE  --  77 61*  BUN  --  66* 70*  CREATININE  --  3.42* 3.71*  CALCIUM  --  8.6* 8.6*  HGB 8.6*  --  9.5*  WBC 8.2  --  9.9     Studies/Results: ECHOCARDIOGRAM COMPLETE Result Date: 02/24/2024     ECHOCARDIOGRAM REPORT   Patient Name:   JEZABELLA SCHRIEVER Date of Exam: 02/24/2024 Medical Rec #:  [id number]      Height:       68.0 in Accession #:    [id number]     Weight:       143.7 lb Date of Birth:  1935/12/22      BSA:          1.776 m Patient Age:    89 years       BP:           96/69 mmHg Patient Gender: F              HR:           136 bpm. Exam Location:  Inpatient Procedure: 2D Echo, Cardiac Doppler, Color Doppler and Intracardiac            Opacification Agent (Both Spectral and Color Flow Doppler were            utilized during procedure). STAT ECHO REPORT CONTAINS CRITICAL RESULT Indications:    I48.91* Unspeicified atrial fibrillation  History:        Patient has prior history of Echocardiogram examinations, most                 recent 08/16/2023. CHF,  Abnormal ECG, Mitral Valve Disease,                 Arrythmias:Atrial Fibrillation and VT; Signs/Symptoms:Altered                 Mental Status. MVR.                  Mitral Valve: St. Jude mechanical valve valve is present in the                 mitral position.  Sonographer:    Ellouise Mose RDCS Referring Phys: [id number] CARINA M BROWN  Sonographer Comments: Technically difficult study due to poor echo windows. Image acquisition challenging due to patient behavioral factors. and Image acquisition challenging due to uncooperative patient. Patient with AMS hitting my arm throughout exam. Patient screaming with CPAP mask. IMPRESSIONS  1. Left ventricular ejection fraction, by estimation, is 30 to 35%. The left ventricle has moderately decreased function. The left ventricle demonstrates regional wall motion abnormalities (see scoring diagram/findings for description). The left ventricular internal cavity size was mildly dilated. There is mild left ventricular hypertrophy. Left ventricular diastolic parameters are indeterminate.  2. Right ventricular systolic function is moderately reduced. The right ventricular size is severely enlarged. There is severely elevated  pulmonary artery systolic pressure. The estimated right ventricular systolic pressure is 71.2 mmHg.  3. Left atrial size was severely dilated.  4. Right atrial size was severely dilated.  5. A small pericardial effusion is present. The pericardial effusion is circumferential. There is no evidence of cardiac tamponade.  6. The mitral valve has been repaired/replaced. Trivial mitral valve regurgitation. Mild to moderate mitral stenosis. There is a St. Jude mechanical valve present in the mitral position.  7. Tricuspid valve regurgitation is moderate to severe.  8. The aortic valve is calcified. Aortic valve regurgitation is trivial. Moderate aortic valve stenosis. FINDINGS  Left Ventricle: Left ventricular ejection fraction, by estimation, is 30 to 35%. The left ventricle has moderately decreased function. The left ventricle demonstrates regional wall motion abnormalities. The left ventricular internal cavity size was mildly dilated. There is mild left ventricular hypertrophy. Left ventricular diastolic parameters are indeterminate. Right Ventricle: The right ventricular size is severely enlarged. No increase in right ventricular wall thickness. Right ventricular systolic function is moderately reduced. There is severely elevated pulmonary artery systolic pressure. The tricuspid regurgitant velocity is 3.75 m/s, and with an assumed right atrial pressure of 15 mmHg, the estimated right ventricular systolic pressure is 71.2 mmHg. Left Atrium: Left atrial size was severely dilated. Right Atrium: Right atrial size was severely dilated. Pericardium: A small pericardial effusion is present. The pericardial effusion is circumferential. There is no evidence of cardiac tamponade. Mitral Valve: The mitral valve has been repaired/replaced. Trivial mitral valve regurgitation. There is a St. Jude mechanical valve present in the mitral position. Mild to moderate mitral valve stenosis. MV peak gradient, 10.2 mmHg. The mean mitral  valve  gradient is 6.0 mmHg. Tricuspid Valve: The tricuspid valve is grossly normal. Tricuspid valve regurgitation is moderate to severe. Aortic Valve: The aortic valve is calcified. Aortic valve regurgitation is trivial. Moderate aortic stenosis is present. Aortic valve mean gradient measures 24.0 mmHg. Aortic valve peak gradient measures 39.5 mmHg. Aortic valve area, by VTI measures 0.99  cm. Pulmonic Valve: The pulmonic valve was normal in structure. Pulmonic valve regurgitation is trivial. No evidence of pulmonic stenosis. Aorta: The aortic root and ascending aorta are structurally normal, with no evidence of dilitation. IAS/Shunts:  No atrial level shunt detected by color flow Doppler. Additional Comments: 3D was performed not requiring image post processing on an independent workstation and was abnormal.  LEFT VENTRICLE PLAX 2D LVIDd:         4.50 cm LVIDs:         3.40 cm LV PW:         1.60 cm LV IVS:        1.20 cm LVOT diam:     2.26 cm LV SV:         60 LV SV Index:   34 LVOT Area:     4.01 cm  LV Volumes (MOD) LV vol d, MOD A2C: 61.5 ml LV vol d, MOD A4C: 52.4 ml LV vol s, MOD A2C: 62.5 ml LV vol s, MOD A4C: 46.0 ml LV SV MOD A2C:     -1.0 ml LV SV MOD A4C:     52.4 ml LV SV MOD BP:      3.0 ml RIGHT VENTRICLE            IVC RV S prime:     7.83 cm/s  IVC diam: 2.15 cm TAPSE (M-mode): 0.7 cm LEFT ATRIUM              Index         RIGHT ATRIUM           Index LA diam:        4.38 cm  2.47 cm/m    RA Area:     34.50 cm LA Vol (A2C):   360.0 ml 202.70 ml/m  RA Volume:   129.00 ml 72.63 ml/m LA Vol (A4C):   227.0 ml 127.81 ml/m LA Biplane Vol: 304.0 ml 171.17 ml/m  AORTIC VALVE AV Area (Vmax):    1.05 cm AV Area (Vmean):   0.99 cm AV Area (VTI):     0.99 cm AV Vmax:           314.33 cm/s AV Vmean:          229.333 cm/s AV VTI:            0.606 m AV Peak Grad:      39.5 mmHg AV Mean Grad:      24.0 mmHg LVOT Vmax:         82.10 cm/s LVOT Vmean:        56.700 cm/s LVOT VTI:          0.149 m LVOT/AV  VTI ratio: 0.25  AORTA Ao Root diam: 3.07 cm Ao Asc diam:  2.81 cm MITRAL VALVE                TRICUSPID VALVE MV Area (PHT): 4.06 cm     TR Peak grad:   56.2 mmHg MV Area VTI:   2.82 cm     TR Vmax:        375.00 cm/s MV Peak grad:  10.2 mmHg MV Mean grad:  6.0 mmHg     SHUNTS MV Vmax:       1.60 m/s     Systemic VTI:  0.15 m MV Vmean:      113.0 cm/s   Systemic Diam: 2.26 cm MV Decel Time: 187 msec MV E velocity: 152.00 cm/s Dub Tobb DO Electronically signed by Dub Huntsman DO Signature Date/Time: 02/24/2024/9:13:51 AM    Final    DG C-Arm 1-60 Min Result Date: 02/23/2024 CLINICAL DATA:  Cystoscopy, left ureteral stent insertion EXAM: DG C-ARM 1-60  MIN COMPARISON:  02/23/2024 FINDINGS: Two fluoroscopic images are obtained during the performance of the procedure and are provided for interpretation only. Initial image demonstrates retrograde ureteral g with evidence of filling defect in the proximal left ureter. Subsequent image demonstrates left ureteral stent placement, proximal aspect coiled in the region of the left renal pelvis. Please refer to the operative report. Fluoroscopy time: 34 seconds, 5.06 mGy IMPRESSION: 1. Intraoperative exam as above. Please refer to the operative report. Electronically Signed   By: Ozell Daring M.D.   On: 02/23/2024 21:20   DG CHEST PORT 1 VIEW Result Date: 02/23/2024 CLINICAL DATA:  Tachypnea EXAM: PORTABLE CHEST 1 VIEW COMPARISON:  02/23/2024 at 11:41 a.m. FINDINGS: Single frontal view of the chest demonstrates stable marked enlargement of the cardiac silhouette. Persistent vascular congestion, with stable bilateral pleural effusions. No airspace disease or pneumothorax. No acute bony abnormalities. IMPRESSION: 1. Stable cardiomegaly. 2. Stable vascular congestion and small bilateral pleural effusions. Electronically Signed   By: Ozell Daring M.D.   On: 02/23/2024 21:19   CT CHEST ABDOMEN PELVIS WO CONTRAST Result Date: 02/23/2024 CLINICAL DATA:  Polytrauma, blunt.   Fall. EXAM: CT CHEST, ABDOMEN AND PELVIS WITHOUT CONTRAST TECHNIQUE: Multidetector CT imaging of the chest, abdomen and pelvis was performed following the standard protocol without IV contrast. RADIATION DOSE REDUCTION: This exam was performed according to the departmental dose-optimization program which includes automated exposure control, adjustment of the mA and/or kV according to patient size and/or use of iterative reconstruction technique. COMPARISON:  CT scan chest from 05/10/2022 and CT virtual colonoscopy from 04/09/2018. FINDINGS: CT CHEST FINDINGS Cardiovascular: Marked cardiomegaly. No pericardial effusion. No aortic aneurysm. There are coronary artery calcifications, in keeping with coronary artery disease. There are also mild to moderate peripheral atherosclerotic vascular calcifications of thoracic aorta and its major branches. There is dilation of the main pulmonary trunk measuring up to 4.1 cm, which is nonspecific but can be seen with pulmonary artery hypertension. Prosthetic mitral valve noted. There are aortic valve calcifications. Mediastinum/Nodes: Visualized thyroid  gland appears grossly unremarkable. No solid / cystic mediastinal masses. The esophagus is nondistended precluding optimal assessment. No mediastinal or axillary lymphadenopathy by size criteria. Evaluation of bilateral hila is limited due to lack on intravenous contrast: however, no large hilar lymphadenopathy identified. Lungs/Pleura: The central tracheo-bronchial tree is patent. Bilateral small-to-moderate pleural effusions noted with associated compressive atelectatic changes in the lungs. There are patchy areas of smooth interlobular and interlobular septal thickening mainly in the bilateral lung apices, concerning for congestive heart failure/pulmonary edema. No suspicious mass, consolidation or lung collapse. No pneumothorax. No suspicious lung nodule. Musculoskeletal: The visualized soft tissues of the chest wall are  grossly unremarkable. Sternotomy wires noted. No suspicious osseous lesions. There are mild to moderate multilevel degenerative changes in the visualized spine. CT ABDOMEN PELVIS FINDINGS Hepatobiliary: The liver is normal in size. There is diffuse liver surface nodularity, compatible with cirrhosis. There is a peripheral/subpleural, triangular, wedge-shaped hypoattenuating area in the right hepatic lobe, segment 6 (series 2, image 57), which is incompletely characterized on the current exam but appears grossly unchanged since the prior study from 2020 and favored benign etiology. No other focal liver lesion seen on this unenhanced exam. No intrahepatic or extrahepatic bile duct dilation. Gallbladder is surgically absent. Pancreas: Unremarkable. No pancreatic ductal dilatation or surrounding inflammatory changes. Spleen: Within normal limits. No focal lesion. Adrenals/Urinary Tract: Adrenal glands are unremarkable. No suspicious mass in right kidney. No right nephroureterolithiasis or obstructive uropathy. There is a 4  x 9 mm calculus in the region of left renal pelvis. There is associated resultant fullness in the left renal collecting system, not well evaluated on this exam. There is also dilated extrarenal pelvis. There is small amount of fluid posteroinferior to the left kidney (series 2, image 67), nonspecific but commonly seen as a sequela of focal calyceal rupture. Correlate clinically to determine the need for further imaging with contrast-enhanced study with delayed images. No left nephrolithiasis. Urinary bladder is minimally distended and appears within normal limits. Stomach/Bowel: Status post right hemicolectomy with ileocolonic anastomosis in the right lower quadrant. No disproportionate dilation of the small or large bowel loops. No evidence of abnormal bowel wall thickening or inflammatory changes. There are multiple diverticula mainly in the sigmoid colon, without imaging signs of diverticulitis.  Vascular/Lymphatic: No ascites or pneumoperitoneum. No abdominal or pelvic lymphadenopathy, by size criteria. No aneurysmal dilation of the major abdominal arteries. There are moderate peripheral atherosclerotic vascular calcifications of the aorta and its major branches. Reproductive: The uterus is unremarkable. No large adnexal mass. Other: The visualized soft tissues and abdominal wall are unremarkable. Musculoskeletal: No suspicious osseous lesions. Right hip arthroplasty noted. There are mild - moderate multilevel degenerative changes in the visualized spine. IMPRESSION: 1. No acute traumatic injury to the chest, abdomen or pelvis. 2. There is a 4 x 9 mm calculus in the region of left renal pelvis with associated fullness in the left renal collecting system, not well evaluated on this exam. There is also dilated extrarenal pelvis. There is small amount of fluid posteroinferior to the left kidney, nonspecific but commonly seen as a sequela of focal calyceal rupture. 3. Bilateral small-to-moderate pleural effusions with associated compressive atelectatic changes in the lungs. There are patchy areas of smooth interlobular and intra lobular septal thickening mainly in the bilateral lung apices along with marked cardiomegaly, highly concerning for congestive heart failure/pulmonary edema. 4. Multiple other nonacute observations (such as dilated main pulmonary trunk, prosthetic mitral valve, aortic valve calcifications, cirrhosis, stable peripheral triangular hypoattenuating area in the liver, status post right hemicolectomy, colonic diverticulosis, etc.), As described above. Aortic Atherosclerosis (ICD10-I70.0). Electronically Signed   By: Ree Molt M.D.   On: 02/23/2024 13:14   CT HEAD WO CONTRAST ( ) Result Date: 02/23/2024 EXAM: CT HEAD WITHOUT CONTRAST 02/23/2024 12:46:00 PM TECHNIQUE: CT of the head was performed without the administration of intravenous contrast. Automated exposure control, iterative  reconstruction, and/or weight based adjustment of the mA/kV was utilized to reduce the radiation dose to as low as reasonably achievable. COMPARISON: CT head 05/10/2022. CLINICAL HISTORY: Head trauma, minor (age >= 65 years). Fall with unknown down time. FINDINGS: The examination is mildly motion degraded despite repeat imaging. BRAIN AND VENTRICLES: There is no evidence of an acute infarct, intracranial hemorrhage, mass, midline shift, hydrocephalus, or extra-axial fluid collection. There is mild cerebral atrophy. Patchy hypodensities in the cerebral white matter bilaterally are similar to the prior CT and nonspecific but compatible with moderate chronic small vessel ischemic disease. There is likely a chronic lacunar infarct in the left thalamus. ORBITS: Bilateral cataract extraction. SINUSES: No acute abnormality. SOFT TISSUES AND SKULL: No acute soft tissue abnormality. Remote nasal bone fracture. No acute skull fracture. IMPRESSION: 1. No acute intracranial abnormality. 2. Moderate chronic small vessel ischemic disease. 3. Cerebral Atrophy (ICD10-G31.9). Electronically signed by: Dasie Hamburg MD 02/23/2024 01:09 PM EST RP Workstation: [id number]   CT Cervical Spine Wo Contrast Result Date: 02/23/2024 EXAM: CT CERVICAL SPINE WITHOUT CONTRAST 02/23/2024 12:46:00 PM TECHNIQUE: CT  of the cervical spine was performed without the administration of intravenous contrast. Multiplanar reformatted images are provided for review. Automated exposure control, iterative reconstruction, and/or weight based adjustment of the mA/kV was utilized to reduce the radiation dose to as low as reasonably achievable. COMPARISON: CT cervical spine 04/07/2022. CLINICAL HISTORY: Neck trauma (Age 75 years or older). Fall with unknown down time. Back pain. FINDINGS: The examination is mildly motion degraded. BONES AND ALIGNMENT: No acute fracture or traumatic malalignment. DEGENERATIVE CHANGES: Moderate disc space narrowing and degenerative  endplate changes from C3-C4 through C6-C7. Focally advanced facet arthrosis at C2-C3 on the left. No evidence of high grade spinal canal stenosis. SOFT TISSUES: No prevertebral soft tissue swelling. IMPRESSION: 1. No acute cervical spine fracture or traumatic malalignment. Electronically signed by: Dasie Hamburg MD 02/23/2024 01:06 PM EST RP Workstation: [id number]   DG Chest Portable 1 View Result Date: 02/23/2024 CLINICAL DATA:  LEFT hip pain status post fall EXAM: PORTABLE CHEST - 1 VIEW COMPARISON:  08/16/2023 FINDINGS: Unchanged marked cardiomegaly. Minimal pulmonary vascular congestion is present. Bibasilar opacities likely combination of atelectasis and small pleural effusions. Median sternotomy changes again seen. IMPRESSION: 1. Unchanged cardiomegaly. 2. Interval development of mild pulmonary vascular congestion and small BILATERAL pleural effusions. Electronically Signed   By: Aliene Lloyd M.D.   On: 02/23/2024 12:36   DG HIP UNILAT WITH PELVIS 2-3 VIEWS LEFT Result Date: 02/23/2024 CLINICAL DATA:  LEFT hip pain status post fall EXAM: DG HIP (WITH OR WITHOUT PELVIS) 2-3V LEFT COMPARISON:  None available FINDINGS: Visualized portions of the RIGHT total hip prosthesis are uncomplicated. Extensive Atherosclerotic changes seen throughout visualized arterial segments. No definite fracture or dislocation of the LEFT hip. Diffuse osteopenia is present. IMPRESSION: No acute fracture or dislocation of the LEFT hip. If the patient has continued difficulty bearing weight, further evaluation with CT or MRI should be performed. Electronically Signed   By: Aliene Lloyd M.D.   On: 02/23/2024 12:34      LOS: 2 days   Ole Bourdon, NP [facility] Pager: [phone]  02/25/2024, 9:06 AM  "

## 2024-02-25 NOTE — Progress Notes (Signed)
 "  Progress Note  [name] Date of Encounter: 02/25/2024 [facility] Cardiologist: Oneil Parchment, MD   Interval Summary   Heart rates have increased overnight.  Arousable.  Seems to respond to questioning fairly well.  Still confused however.  Vital Signs Vitals:   02/25/24 0344 02/25/24 0345 02/25/24 0507 02/25/24 0712  BP: (!) 87/76  98/62 (!) 87/56  Pulse: (!) 33  (!) 57 88  Resp: 20   18  Temp: 97.7 F (36.5 C)   (!) 97.5 F (36.4 C)  TempSrc: Axillary   Oral  SpO2: 97%  91% 100%  Weight:  64.9 kg      Intake/Output Summary (Last 24 hours) at 02/25/2024 0813 Last data filed at 02/25/2024 0640 Gross per 24 hour  Intake 584.53 ml  Output 1600 ml  Net -1015.47 ml      02/25/2024    3:45 AM 02/24/2024    4:00 AM 12/31/2023    8:34 AM  Last 3 Weights  Weight (lbs) 143 lb 1.3 oz 143 lb 11.8 oz 147 lb  Weight (kg) 64.9 kg 65.2 kg 66.679 kg      Telemetry/ECG  A-fib heart rates in the 130s- Personally Reviewed  Physical Exam  GEN: Frail, elderly, ill-appearing Neck: No JVD Cardiac: Tachycardic irregularly irregular, no murmurs, rubs, or gallops.  Respiratory: Clear to auscultation bilaterally. GI: Soft, nontender, non-distended  MS: No edema  Assessment & Plan   89 year old with urgent ureteral stent placement, sepsis, shock/both distributive as well as cardiogenic/acute respiratory failure improved transitioned off of BiPAP with encephalopathy improved, permanent atrial fibrillation with bundle branch block with heart rate increasing overnight to 120-130.  Permanent atrial fibrillation longstanding - No indication for cardioversion.  She has been in atrial fibrillation for quite some time.  DC cardioversion would not likely hold.  Seems to be under reasonable rate control.  Current mild tachycardia understandable given her metabolic needs, however heart rates are currently in the 130s increasing overnight. - Start IV amiodarone  given relative  hypotension to help improve rate control.  Hopefully this will be a short-term course as she continues to improve from an infectious standpoint -Currently on IV heparin .    Sepsis/urosepsis/ureteral stent - Urology on board. - Zosyn /vancomycin . -Blood pressure fairly low this morning 87/56, fairly robust urine output yesterday at 1.6 L creatinine increased from 3.3 up to 3.7. -Lactic acid down from 2.2-1.5  Acute on chronic systolic heart failure with component of cardiogenic shock-EF 30% on echocardiogram - Good overall diuresis yesterday 1.6 L, creatinine has increased.  She is off of BiPAP.  Lets hold on any Lasix  today.  Pulmonary hypertension - 71 mmHg pressure estimated right ventricular pressures.  Severely dilated right atrium.  Possibly in part secondary to LV dysfunction over time.  No evidence of PE.  Normal coronary arteries on prior cardiac catheterization 2023, right ventricular systolic pressure was 62 in 2023.  No significant change.  Moderate aortic valve stenosis - Should be of minimal clinical significance at this time.  Aortic valve mean gradient of 24 mmHg.  Encephalopathy - Mildly improved, multifactorial, mittens in place      For questions or updates, please contact [facility] Please consult [email or web address] for contact info under        CRITICAL CARE TIME Performed by: Oneil Parchment, MD  Discussed with primary team. Total critical care time: 40 minutes  Critical care time was exclusive of separately billable procedures and treating other patients.  Critical  care was necessary to treat or prevent imminent or life-threatening deterioration.  Critical care was time spent personally by me on the following activities: development of treatment plan with patient and/or surrogate as well as nursing, discussions with consultants, evaluation of patient's response to treatment, examination of patient, obtaining history from patient or surrogate, ordering and  performing treatments and interventions, ordering and review of laboratory studies, ordering and review of radiographic studies, pulse oximetry and re-evaluation of patient's condition. Signed, Oneil Parchment, MD   "

## 2024-02-25 NOTE — Assessment & Plan Note (Addendum)
 Unclear etiology.  Differential includes sepsis, stroke, and arrhythmia as above.  CT head showed no acute intracranial abnormality, moderate chronic small vessel ischemia, and cerebral atrophy. - MRI once patient has improved clinically, unable to complete at this time due to severity of illness - Delirium precautions - Neurochecks every 4 hours - Bedside swallow evaluation - NPO pending BSE

## 2024-02-25 NOTE — Assessment & Plan Note (Addendum)
 Unwitnessed fall at home, patient on warfarin.  However, unclear compliance as of late.  INR will be subtherapetuic while admitted because of switch to heparin ; INR goal 2.5-3.0 prior on Warfarin prior to DC for mechanical heart valve. Lovenox  not an option. CK 140. Now stable enough for MRI. - Heparin  drip per pharmacy  - Fall precautions - Pain: Dilaudid  0.5mg  once, reassess frequently as patient not sufficiently oriented to request analgesics

## 2024-02-25 NOTE — Progress Notes (Addendum)
 "    Daily Progress Note Intern Pager: [phone]  [name] Medical record number: [id number] Date of birth: October 14, 1935 Age: 89 y.o. Gender: female  Primary Care Provider: [name] Camie CROME, MD Consultants: Urology, Cardiology Code Status: DNR  Pt Overview and Major Events to Date:  2/3 - admitted, L Ureteral stent placement  Medical Decision Making:  [name] is an 89 yo F presenting with Sepsis from bladder infection, heart failure, kidney failure, that is POD1 for Uretral stent placement who had an unwitnessed fall at home and AMS. PPMHx includes Bilateral HF (EF30-35%), A.Fib w/ RVR, CKD.  We are monitoring her vitals and fluid status. If RR become less labored, we can dc the BiPAP, if we have ongoing tachycardia, can start IV Amio, but this needs to be cautioned given her HF (Cardiology recommendation is consider if HR >130). Have given lots of diuresis, but only ~1L Urinary output, likely prerenal. Want to diurese for HF, but don't want too dry given acute on chronic kidney injury. Monitoring for now. Prognosis is guarded, considering palliative consult.  Assessment & Plan Sepsis (HCC) Kidney stone Chronic kidney disease (CKD) Bladder infection AKI (acute kidney injury) Hypothermic to 96.4 Fahrenheit, tachycardic to 150s, tachypneic to 40s, bladder is likley source. CTAP showed 4X9 mm calculus in left renal pelvis region and dilated extrarenal pelvis and small posterior inferior fluid, possibly 2/2 sequelae of focal calyceal rupture. VBG: pCO2 42, Lactic Acid 1.5, Bladder scans have shown minimal urine, suggesting pre-renal etiology for low urine output. Cr. Increased to 3.71 from 3.07 on admission. - Urology consulted, appreciate recs  - Stopping Vancomycin  - Continue Zosyn  - Hydrating as able against heart failure - Blood culture x 2, pending - AM CBC, CMP - Consult Palliative care, appreciate recs Fall History of mitral valve replacement - St Jude  mechanical valve Warfarin anticoagulation Unwitnessed fall at home, patient on warfarin.  However, unclear compliance as of late.  INR will be subtherapetuic while admitted because of switch to heparin ; INR goal 2.5-3.0 prior on Warfarin prior to DC for mechanical heart valve. Lovenox  not an option. CK 140. Now stable enough for MRI. - Heparin  drip per pharmacy  - Fall precautions - Pain: Dilaudid  0.5mg  once, reassess frequently as patient not sufficiently oriented to request analgesics Dysarthria Likely new from admission.  - MRI Brain w/o contrast - Swallow Eval, patient is hungry and able to voice this Atrial fibrillation with RVR (HCC) Patient in A-fib with RVR with rates in the 140s as well as wide complex tachycardia. Home medication diltiazem  180 mg daily.  Initial troponin 63, repeat 70.  EKG showed tachycardic, A-fib, RBBB.  Patient has known RBBB. Suspect partly d/t pain and infection.  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 Echo showed EF 30-35%, increased atrial pressures, tricuspid regurgitation. Pt given heavy diuresis night of 02/23/2024 with ~1L urine output.  - BiPAP - Strict I&Os - Daily weights - Monitoring closely in case more diuresis is needed AMS (altered mental status) Unclear etiology.  Differential includes sepsis, stroke, and arrhythmia as above.  CT head showed no acute intracranial abnormality, moderate chronic small vessel ischemia, and  cerebral atrophy. - MRI once patient has improved clinically, unable to complete at this time due to severity of illness - Delirium  precautions - Neurochecks every 4 hours - Bedside swallow evaluation - NPO pending BSE Hyperkalemia (RESOLVED) 5.3 initially, down to 4.8 on 2/4 after heavy IV lasix .  Will continue to monitor. - AM CMP  Chronic health problem Holding all other home meds until swallow eval is able to be completed.  FEN/GI: NPO PPx: Heparin  per pharmacy Dispo:TBD pending clinical improvement . Barriers include clinical stability Prognosis: Guarded   Subjective:  Pt was A&Ox3, much improved since admission. Extremely dysarthric, difficult to obtain history, but can understand her appropriate answered to orientation questions, and reports that is not in any pain. Has poor memory of events leading her to the hospital. Expresses that she is hungry.  Objective: Temp:  [97.3 F (36.3 C)-97.7 F (36.5 C)] 97.5 F (36.4 C) (02/05 0712) Pulse Rate:  [33-108] 88 (02/05 0712) Resp:  [18-29] 18 (02/05 0712) BP: (82-105)/(56-76) 87/56 (02/05 0712) SpO2:  [91 %-100 %] 100 % (02/05 0712) Weight:  [64.9 kg] 64.9 kg (02/05 0345)  Physical Exam Vitals and nursing note reviewed.  Constitutional:      General: She is not in acute distress.    Appearance: She is ill-appearing.  Cardiovascular:     Rate and Rhythm: Normal rate and regular rhythm.     Heart sounds: Murmur heard.  Pulmonary:     Effort: Pulmonary effort is normal.     Comments: On [place] Abdominal:     General: Bowel sounds are normal.  Musculoskeletal:     Right lower leg: No edema.     Left lower leg: No edema.  Skin:    General: Skin is warm and dry.  Neurological:     Mental Status: She is alert.     Laboratory: Most recent CBC Lab Results  Component Value Date   WBC 9.9 02/25/2024   HGB 9.5 (L) 02/25/2024   HCT 33.4 (L) 02/25/2024   MCV 90.0 02/25/2024   PLT 107 (L) 02/25/2024   Most recent BMP    Latest Ref Rng & Units 02/25/2024    2:11 AM  BMP  Glucose 70 - 99 mg/dL 61   BUN 8 - 23 mg/dL 70   Creatinine 9.55 - 1.00 mg/dL  6.28   Sodium 864 - 854 mmol/L 146   Potassium 3.5 - 5.1 mmol/L 4.6   Chloride 98 - 111 mmol/L 110   CO2 22 - 32 mmol/L 23   Calcium 8.9 - 10.3 mg/dL 8.6     Imaging/Diagnostic Tests: Radiologist Impression:  ECHOCARDIOGRAM COMPLETE Result Date: 02/24/2024    ECHOCARDIOGRAM REPORT   Patient Name:   JENAVI BEEDLE Date of Exam: 02/24/2024 Medical Rec #:  [id number]      Height:       68.0 in Accession #:    [id number]     Weight:       143.7 lb Date of Birth:  January 05, 1936      BSA:          1.776 m Patient Age:    88 years       BP:           96/69 mmHg Patient Gender: F              HR:           136 bpm. Exam Location:  Inpatient Procedure: 2D Echo, Cardiac Doppler, Color Doppler  and Intracardiac            Opacification Agent (Both Spectral and Color Flow Doppler were            utilized during procedure). STAT ECHO REPORT CONTAINS CRITICAL RESULT Indications:    I48.91* Unspeicified atrial fibrillation  History:        Patient has prior history of Echocardiogram examinations, most                 recent 08/16/2023. CHF, Abnormal ECG, Mitral Valve Disease,                 Arrythmias:Atrial Fibrillation and VT; Signs/Symptoms:Altered                 Mental Status. MVR.                  Mitral Valve: St. Jude mechanical valve valve is present in the                 mitral position.  Sonographer:    Ellouise Mose RDCS Referring Phys: [id number] CARINA M BROWN  Sonographer Comments: Technically difficult study due to poor echo windows. Image acquisition challenging due to patient behavioral factors. and Image acquisition challenging due to uncooperative patient. Patient with AMS hitting my arm throughout exam. Patient screaming with CPAP mask. IMPRESSIONS  1. Left ventricular ejection fraction, by estimation, is 30 to 35%. The left ventricle has moderately decreased function. The left ventricle demonstrates regional wall motion abnormalities (see scoring diagram/findings for description). The left ventricular internal  cavity size was mildly dilated. There is mild left ventricular hypertrophy. Left ventricular diastolic parameters are indeterminate.  2. Right ventricular systolic function is moderately reduced. The right ventricular size is severely enlarged. There is severely elevated pulmonary artery systolic pressure. The estimated right ventricular systolic pressure is 71.2 mmHg.  3. Left atrial size was severely dilated.  4. Right atrial size was severely dilated.  5. A small pericardial effusion is present. The pericardial effusion is circumferential. There is no evidence of cardiac tamponade.  6. The mitral valve has been repaired/replaced. Trivial mitral valve regurgitation. Mild to moderate mitral stenosis. There is a St. Jude mechanical valve present in the mitral position.  7. Tricuspid valve regurgitation is moderate to severe.  8. The aortic valve is calcified. Aortic valve regurgitation is trivial. Moderate aortic valve stenosis. FINDINGS  Left Ventricle: Left ventricular ejection fraction, by estimation, is 30 to 35%. The left ventricle has moderately decreased function. The left ventricle demonstrates regional wall motion abnormalities. The left ventricular internal cavity size was mildly dilated. There is mild left ventricular hypertrophy. Left ventricular diastolic parameters are indeterminate. Right Ventricle: The right ventricular size is severely enlarged. No increase in right ventricular wall thickness. Right ventricular systolic function is moderately reduced. There is severely elevated pulmonary artery systolic pressure. The tricuspid regurgitant velocity is 3.75 m/s, and with an assumed right atrial pressure of 15 mmHg, the estimated right ventricular systolic pressure is 71.2 mmHg. Left Atrium: Left atrial size was severely dilated. Right Atrium: Right atrial size was severely dilated. Pericardium: A small pericardial effusion is present. The pericardial effusion is circumferential. There is no evidence  of cardiac tamponade. Mitral Valve: The mitral valve has been repaired/replaced. Trivial mitral valve regurgitation. There is a St. Jude mechanical valve present in the mitral position. Mild to moderate mitral valve stenosis. MV peak gradient, 10.2 mmHg. The mean mitral valve  gradient is 6.0 mmHg. Tricuspid Valve: The tricuspid  valve is grossly normal. Tricuspid valve regurgitation is moderate to severe. Aortic Valve: The aortic valve is calcified. Aortic valve regurgitation is trivial. Moderate aortic stenosis is present. Aortic valve mean gradient measures 24.0 mmHg. Aortic valve peak gradient measures 39.5 mmHg. Aortic valve area, by VTI measures 0.99  cm. Pulmonic Valve: The pulmonic valve was normal in structure. Pulmonic valve regurgitation is trivial. No evidence of pulmonic stenosis. Aorta: The aortic root and ascending aorta are structurally normal, with no evidence of dilitation. IAS/Shunts: No atrial level shunt detected by color flow Doppler. Additional Comments: 3D was performed not requiring image post processing on an independent workstation and was abnormal.  LEFT VENTRICLE PLAX 2D LVIDd:         4.50 cm LVIDs:         3.40 cm LV PW:         1.60 cm LV IVS:        1.20 cm LVOT diam:     2.26 cm LV SV:         60 LV SV Index:   34 LVOT Area:     4.01 cm  LV Volumes (MOD) LV vol d, MOD A2C: 61.5 ml LV vol d, MOD A4C: 52.4 ml LV vol s, MOD A2C: 62.5 ml LV vol s, MOD A4C: 46.0 ml LV SV MOD A2C:     -1.0 ml LV SV MOD A4C:     52.4 ml LV SV MOD BP:      3.0 ml RIGHT VENTRICLE            IVC RV S prime:     7.83 cm/s  IVC diam: 2.15 cm TAPSE (M-mode): 0.7 cm LEFT ATRIUM              Index         RIGHT ATRIUM           Index LA diam:        4.38 cm  2.47 cm/m    RA Area:     34.50 cm LA Vol (A2C):   360.0 ml 202.70 ml/m  RA Volume:   129.00 ml 72.63 ml/m LA Vol (A4C):   227.0 ml 127.81 ml/m LA Biplane Vol: 304.0 ml 171.17 ml/m  AORTIC VALVE AV Area (Vmax):    1.05 cm AV Area (Vmean):   0.99 cm AV  Area (VTI):     0.99 cm AV Vmax:           314.33 cm/s AV Vmean:          229.333 cm/s AV VTI:            0.606 m AV Peak Grad:      39.5 mmHg AV Mean Grad:      24.0 mmHg LVOT Vmax:         82.10 cm/s LVOT Vmean:        56.700 cm/s LVOT VTI:          0.149 m LVOT/AV VTI ratio: 0.25  AORTA Ao Root diam: 3.07 cm Ao Asc diam:  2.81 cm MITRAL VALVE                TRICUSPID VALVE MV Area (PHT): 4.06 cm     TR Peak grad:   56.2 mmHg MV Area VTI:   2.82 cm     TR Vmax:        375.00 cm/s MV Peak grad:  10.2 mmHg MV Mean grad:  6.0 mmHg  SHUNTS MV Vmax:       1.60 m/s     Systemic VTI:  0.15 m MV Vmean:      113.0 cm/s   Systemic Diam: 2.26 cm MV Decel Time: 187 msec MV E velocity: 152.00 cm/s Dub Tobb DO Electronically signed by Dub Huntsman DO Signature Date/Time: 02/24/2024/9:13:51 AM    Final    Lera Nancyann NOVAK, DO 02/25/2024, 8:23 AM  PGY-1, [facility] FPTS Intern pager: [phone], text pages welcome Secure chat group [facility] Teaching Service   "

## 2024-02-25 NOTE — Plan of Care (Signed)
 FMTS Interim Progress Note  S: Reports feeling alright, no current complaints.   O: BP (!) 73/56 (BP Location: Right Arm)   Pulse 61   Temp (!) 97.5 F (36.4 C) (Axillary)   Resp 14   Wt 64.9 kg   SpO2 98%   BMI 21.76 kg/m   General: No acute distress. Sleeping comfortably.  CV: Tachycardic, irregular rate and rhythm. Warm. Pulm: Breathing comfortably on 4LNC O2. CTAB of anterior fields. No increased WOB. Ext: Normal DP pulses bilaterally. Continued BLE edema.  Skin:  Warm, dry. Neuro: Easily awoken to voice. AxOx4.   A/P: Urosepsis Afib w RVR CHF Relatively stable this time with HR 110s in room, satting appropriately on 4L [place] without respiratory distress, with MAP mid 60s. Urine cx with proteus mirabilis sensitive to CTX.  - No further IVF per CCM - Continue amio gtt  - Consider CCM re-involvement if MAPs <60  - Continue CTX daily  - Pain: Tylenol  650 q6, consider spot-dosing dilaudid  if patient demonstrating signs of pain   Remainder of care plan per prior FMTS notes.   Diona Perkins, MD 02/25/2024, 8:17 PM PGY-2, [facility] Service pager [phone]

## 2024-02-25 NOTE — Evaluation (Signed)
 Clinical/Bedside Swallow Evaluation Patient Details  Name: [name] MRN: [id number] Date of Birth: 10/07/1935  Today's Date: 02/25/2024 Time: SLP Start Time (ACUTE ONLY): 1426 SLP Stop Time (ACUTE ONLY): 1448 SLP Time Calculation (min) (ACUTE ONLY): 22 min  Past Medical History:  Past Medical History:  Diagnosis Date   Asthma    years ago   Chronic atrial fibrillation (HCC)    Dilated cardiomyopathy (HCC)    history of this, now resolved   Dysphagia    Hx   Fracture of tibial plateau 05/21/2015   GERD (gastroesophageal reflux disease)    GI bleed    Hemorrhoid 04/2014   bleeding   History of blood transfusion    Hypertension    Microcytic anemia    Nonsustained ventricular tachycardia (HCC)    Osteoporosis    Protein in urine    RBBB (right bundle branch block)    Rheumatic mitral valve disease    Past Surgical History:  Past Surgical History:  Procedure Laterality Date   CHOLECYSTECTOMY  2005   COLONOSCOPY     COLONOSCOPY W/ POLYPECTOMY     CYSTOSCOPY WITH STENT PLACEMENT Left 02/23/2024   Procedure: CYSTOSCOPY, WITH STENT INSERTION;  Surgeon: Cam Morene ORN, MD;  Location: [facility] OR;  Service: Urology;  Laterality: Left;   EYE SURGERY  04/2014   HEMICOLECTOMY  2008   forpolyps   MITOMYCIN  C APPLICATION Right 05/17/2014   Procedure: MITOMYCIN  C APPLICATION;  Surgeon: Gaither Quan, MD;  Location: [facility] OR;  Service: Ophthalmology;  Laterality: Right;   MITRAL VALVE REPLACEMENT  1987   St Jude mechanical valve   RIGHT/LEFT HEART CATH AND CORONARY ANGIOGRAPHY N/A 04/02/2021   Procedure: RIGHT/LEFT HEART CATH AND CORONARY ANGIOGRAPHY;  Surgeon: Cherrie Toribio SAUNDERS, MD;  Location: [facility] INVASIVE CV LAB;  Service: Cardiovascular;  Laterality: N/A;   TOTAL HIP ARTHROPLASTY Right 04/08/2022   Procedure: TOTAL HIP ARTHROPLASTY;  Surgeon: Beverley Evalene BIRCH, MD;  Location: [facility] OR;  Service: Orthopedics;  Laterality: Right;   TRABECULECTOMY Right 05/17/2014   Procedure:  TRABECULECTOMY WITH MITOMYCIN  RIGHT EYE;  Surgeon: Gaither Quan, MD;  Location: [facility] OR;  Service: Ophthalmology;  Laterality: Right;   TUBAL LIGATION     US  ECHOCARDIOGRAPHY  02/2008, 03/2006, 06/2004   HPI:  89 y.o. female presents to [facility] 02/23/24 with an unwitnessed fall at home and AMS. CTH and neck negative. Pt with sepsis, cardiogenic shock, infected L uretal stone, and hypotension. Urgent L ureteral stent and cystoscopy. Pt with new dysarthria s/p fall. PMHx: MVR on chronic a/c, Ao stenosis, HFrEF 45-50%, RHF, PAH, severe, CKD4, Chronic afib, GERD, HTN, AOCD    Assessment / Plan / Recommendation  Clinical Impression  PLAN: Will keep NPO. Ice chips PRN after oral care and meds with puree. Pt with overt clinical s/s of aspiration at bedside. Recommend MBS as able to schedule with radiology.   Pt consumed ice chips and thin liquid via cup edge and straw. Pt displayed strong sensation and had immediately coughing and throat clearing on all trials of thin liquid. Pt consumed puree w/o concerns for aspiration. Given s/s of aspiration at bedside and moderate risk for aspiration (advanced age, dysarthria s/p fall, recent respiratory distress, cognitive deficits), recommend instrumental assessment prior to beginning PO diet.   SLP Visit Diagnosis: Dysphagia, unspecified (R13.10)    Aspiration Risk  Moderate aspiration risk    Diet Recommendation           Other Recommendations Oral Care Recommendations: Oral care QID;Oral  care prior to ice chip/H20 Caregiver Recommendations: Remove water  pitcher;Have oral suction available     Swallow Evaluation Recommendations Recommendations: NPO except meds;Ice chips PRN after oral care Medication Administration: Whole meds with puree Oral care recommendations: Oral care before ice chips/water ;Oral care QID (4x/day) Caregiver Recommendations: Remove water  pitcher;Have oral suction available   Assistance Recommended at Discharge    Functional Status  Assessment Patient has had a recent decline in their functional status and demonstrates the ability to make significant improvements in function in a reasonable and predictable amount of time.  Frequency and Duration            Prognosis Prognosis for improved oropharyngeal function: Good      Swallow Study   General HPI: 89 y.o. female presents to [facility] 02/23/24 with an unwitnessed fall at home and AMS. CTH and neck negative. Pt with sepsis, cardiogenic shock, infected L uretal stone, and hypotension. Urgent L ureteral stent and cystoscopy. Pt with new dysarthria s/p fall. PMHx: MVR on chronic a/c, Ao stenosis, HFrEF 45-50%, RHF, PAH, severe, CKD4, Chronic afib, GERD, HTN, AOCD Type of Study: Bedside Swallow Evaluation Diet Prior to this Study: NPO Temperature Spikes Noted: No Respiratory Status: Nasal cannula History of Recent Intubation: No Behavior/Cognition: Alert;Cooperative;Pleasant mood Oral Cavity Assessment: Within Functional Limits Oral Care Completed by SLP: Yes Oral Cavity - Dentition: Missing dentition Vision: Impaired for self-feeding Self-Feeding Abilities: Needs assist Patient Positioning: Upright in bed Baseline Vocal Quality: Normal    Oral/Motor/Sensory Function Overall Oral Motor/Sensory Function: Within functional limits   Ice Chips Ice chips: Within functional limits Presentation: Spoon   Thin Liquid Thin Liquid: Impaired Presentation: Spoon;Cup;Straw Oral Phase Impairments: Reduced labial seal Pharyngeal  Phase Impairments: Throat Clearing - Immediate;Cough - Immediate Other Comments: Pt nodded when asked if bolus went down the wrong way    Nectar Thick Nectar Thick Liquid: Not tested   Honey Thick Honey Thick Liquid: Not tested   Puree Puree: Within functional limits   Solid     Solid: Not tested      Rocky Quan, Student SLP  02/25/2024,3:15 PM

## 2024-02-25 NOTE — Progress Notes (Signed)
 ANTICOAGULATION CONSULT NOTE  Pharmacy Consult for Heparin  Indication: atrial fibrillation/St Jude MVR  Allergies[1]  Patient Measurements: Weight: 64.9 kg (143 lb 1.3 oz) Heparin  Dosing Weight: 65 kg  Vital Signs: Temp: 97.5 F (36.4 C) (02/05 0712) Temp Source: Oral (02/05 0712) BP: 87/56 (02/05 0712) Pulse Rate: 88 (02/05 0712)  Labs: Recent Labs    02/23/24 1139 02/23/24 1145 02/23/24 1734 02/24/24 0000 02/24/24 0239 02/24/24 0832 02/24/24 1836 02/25/24 0211  HGB 10.1* 11.9*  --   --  8.6*  --   --  9.5*  HCT 35.9* 35.0*  --   --  30.7*  --   --  33.4*  PLT 141*  --   --   --  108*  --   --  107*  LABPROT 18.3*  --   --   --  21.3*  --   --  19.8*  INR 1.4*  --   --   --  1.7*  --   --  1.6*  HEPARINUNFRC  --   --   --   --   --  0.23* 0.21* 0.39  CREATININE 3.07* 3.30* 3.33* 3.34*  --  3.42*  --  3.71*  CKTOTAL  --   --  140  --   --   --   --   --     Estimated Creatinine Clearance: 11 mL/min (A) (by C-G formula based on SCr of 3.71 mg/dL (H)).   Assessment: 88 yof with a medical history significant for atrial fibrillation and history of mMVR on warfarin PTA (INR goal 2.5-3.5). Per [facility] clinic note, PTA warfarin regimen is 2.5 mg MWF, 5 mg all other days. Patient now s/p cystoscopy and ureteral stent placement. Pharmacy consulted to manage heparin  while warfarin is held.  Heparin  level at goal at current rate 1300 units/hr. No issues with infusion or s/sx bleeding reported.  Goal of Therapy:  Heparin  level 0.3-0.7 units/ml Monitor platelets by anticoagulation protocol: Yes   Plan:  Continue heparin  infusion at 1300 units/hr Check heparin  level in 8 hours and daily while on heparin  Continue to monitor H&H and platelets  Thank you for allowing pharmacy to be a part of this patients care.  Shelba Collier, PharmD, BCPS Clinical Pharmacist    [1]  Allergies Allergen Reactions   Iodinated Contrast Media Rash    Pt stated in the past had broken out  in a rash on back and itching.

## 2024-02-25 NOTE — Progress Notes (Signed)
 OT Cancellation Note  Patient Details Name: [name] MRN: [id number] DOB: 06-25-1935   Cancelled Treatment:    Reason Eval/Treat Not Completed: Patient not medically ready Pt in a fib and tachy at rest, also hypotensive. Discussed with RN, potentially starting on amio. Will hold OT eval attempts and check back later as able.   Mliss Fish 02/25/2024, 7:55 AM

## 2024-02-25 NOTE — Assessment & Plan Note (Addendum)
(  RESOLVED) 5.3 initially, down to 4.8 on 2/4 after heavy IV lasix .  Will continue to monitor. - AM CMP

## 2024-02-25 NOTE — Assessment & Plan Note (Signed)
 Hypothermic to 96.4 Fahrenheit, tachycardic to 150s, tachypneic to 40s, bladder is likley source. CTAP showed 4X9 mm calculus in left renal pelvis region and dilated extrarenal pelvis and small posterior inferior fluid, possibly 2/2 sequelae of focal calyceal rupture. VBG: pCO2 42, Lactic Acid 1.5, Bladder scans have shown minimal urine, suggesting pre-renal etiology for low urine output. Cr. Increased to 3.71 from 3.07 on admission. - Urology consulted, appreciate recs  - Stopping Vancomycin  - Continue Zosyn  - Hydrating as able against heart failure - Blood culture x 2, pending - AM CBC, CMP - Consult Palliative care, appreciate recs

## 2024-02-25 NOTE — Evaluation (Signed)
 Occupational Therapy Evaluation Patient Details Name: [name] MRN: [id number] DOB: 06-20-1935 Today's Date: 02/25/2024   History of Present Illness   89 y.o. female presents to [facility] 02/23/24 with an unwitnessed fall at home and AMS. CTH and neck negative. Pt with sepsis, cardiogenic shock, infected L uretal stone, and hypotension. Urgent L ureteral stent and cystoscopy. PMHx: MVR on chronic a/c, Ao stenosis, HFrEF 45-50%, RHF, PAH, severe, CKD4, Chronic afib, GERD, HTN, AOCD     Clinical Impressions PTA, pt lives alone, typically Modified Independent with ADLs, household IADLs and mobility using cane vs walker. Pt presents now with deficits in cognition, cardiopulmonary endurance and strength. Pt with consistent hypotension so implemented strategies (counterpressure exercises, SCDs, etc) while bed in chair position to assess tolerance to activities. Pt requiring Min A for simple grooming tasks, Max-Total A for other ADLs bed level. BP remained stable but low so deferred EOB/OOB attempts today. Patient will benefit from continued inpatient follow up therapy, <3 hours/day  HR 127-132bpm SpO2 WFL on 3 L O2 BP initially 75/51, up to 84/74 during session     If plan is discharge home, recommend the following:   A lot of help with walking and/or transfers;Two people to help with walking and/or transfers;A lot of help with bathing/dressing/bathroom     Functional Status Assessment   Patient has had a recent decline in their functional status and demonstrates the ability to make significant improvements in function in a reasonable and predictable amount of time.     Equipment Recommendations   Hospital bed     Recommendations for Other Services         Precautions/Restrictions   Precautions Precautions: Fall Recall of Precautions/Restrictions: Impaired Precaution/Restrictions Comments: watch BP, HR Restrictions Weight Bearing Restrictions Per Provider Order: No      Mobility Bed Mobility Overal bed mobility: Needs Assistance             General bed mobility comments: Max A to pull self forward in bed to long sitting. Deferred EOB d/t hypotension    Transfers                   General transfer comment: deferred      Balance                                           ADL either performed or assessed with clinical judgement   ADL Overall ADL's : Needs assistance/impaired Eating/Feeding: NPO   Grooming: Minimal assistance;Bed level;Wash/dry face Grooming Details (indicate cue type and reason): assist to initiate hand to face with pt then able to wash face Upper Body Bathing: Maximal assistance;Bed level Upper Body Bathing Details (indicate cue type and reason): able to bathe L UE with RUE but difficulty following commands to wash opposite arm Lower Body Bathing: Total assistance;Bed level   Upper Body Dressing : Maximal assistance;Bed level   Lower Body Dressing: Total assistance;Bed level       Toileting- Clothing Manipulation and Hygiene: Total assistance;Bed level         General ADL Comments: Limited to chair position in bed d/t consistent hypotension despite completion of counterpressure exercises, SCDs for blood flow, etc; deferred EOB attempts until BP improves.     Vision Ability to See in Adequate Light: 0 Adequate Patient Visual Report: No change from baseline Vision Assessment?: No apparent visual deficits  Perception         Praxis         Pertinent Vitals/Pain Pain Assessment Pain Assessment: No/denies pain     Extremity/Trunk Assessment Upper Extremity Assessment Upper Extremity Assessment: Generalized weakness;Right hand dominant   Lower Extremity Assessment Lower Extremity Assessment: Defer to PT evaluation   Cervical / Trunk Assessment Cervical / Trunk Assessment: Normal   Communication Communication Communication: Impaired Factors Affecting Communication:  Hearing impaired   Cognition Arousal: Alert Behavior During Therapy: WFL for tasks assessed/performed, Flat affect Cognition: Cognition impaired, Difficult to assess Difficult to assess due to: Impaired communication Orientation impairments: Situation, Time Awareness: Online awareness impaired, Intellectual awareness impaired Memory impairment (select all impairments): Short-term memory, Working memory, Engineer, structural memory Attention impairment (select first level of impairment): Sustained attention Executive functioning impairment (select all impairments): Initiation, Organization, Sequencing, Reasoning, Problem solving OT - Cognition Comments: pleasant, slower processing and initiation but fair command following. reports she fell at home but unable to provide further details                 Following commands: Impaired Following commands impaired: Follows one step commands inconsistently, Follows one step commands with increased time     Cueing  General Comments   Cueing Techniques: Verbal cues;Gestural cues;Tactile cues      Exercises     Shoulder Instructions      Home Living Family/patient expects to be discharged to:: Private residence Living Arrangements: Alone Available Help at Discharge: Family Type of Home: Apartment Home Access: Level entry     Home Layout: One level     Bathroom Shower/Tub: Chief Strategy Officer: Handicapped height Bathroom Accessibility: Yes   Home Equipment: Shower seat;Wheelchair - manual;Grab bars - tub/shower;BSC/3in1;Rollator (4 wheels)   Additional Comments: senior living apt and has 2 adult daughters that do not work that can help      Prior Functioning/Environment Prior Level of Function : Independent/Modified Independent             Mobility Comments: uses rollator vs cane for mobility ADLs Comments: Pt Mod I for ADLs, household IADLs. does not drive    OT Problem List: Decreased  strength;Decreased activity tolerance;Impaired balance (sitting and/or standing);Decreased cognition;Decreased safety awareness;Decreased knowledge of use of DME or AE;Decreased knowledge of precautions;Cardiopulmonary status limiting activity   OT Treatment/Interventions: Self-care/ADL training;Therapeutic exercise;Energy conservation;DME and/or AE instruction;Therapeutic activities;Patient/family education;Balance training      OT Goals(Current goals can be found in the care plan section)   Acute Rehab OT Goals Patient Stated Goal: pt unable to state but was agreeable to attempt mobility when asked OT Goal Formulation: Patient unable to participate in goal setting Time For Goal Achievement: 03/10/24 Potential to Achieve Goals: Fair   OT Frequency:  Min 1X/week    Co-evaluation PT/OT/SLP Co-Evaluation/Treatment: Yes Reason for Co-Treatment: For patient/therapist safety;To address functional/ADL transfers PT goals addressed during session: Strengthening/ROM OT goals addressed during session: ADL's and self-care      AM-PAC OT 6 Clicks Daily Activity     Outcome Measure Help from another person eating meals?: Total Help from another person taking care of personal grooming?: A Little Help from another person toileting, which includes using toliet, bedpan, or urinal?: Total Help from another person bathing (including washing, rinsing, drying)?: A Lot Help from another person to put on and taking off regular upper body clothing?: A Lot Help from another person to put on and taking off regular lower body clothing?: Total 6 Click Score:  10   End of Session Equipment Utilized During Treatment: Oxygen  Nurse Communication: Mobility status  Activity Tolerance: Patient limited by fatigue;Treatment limited secondary to medical complications (Comment) Patient left: in bed;with call bell/phone within reach;with bed alarm set  OT Visit Diagnosis: Other abnormalities of gait and mobility  (R26.89);Unsteadiness on feet (R26.81);Muscle weakness (generalized) (M62.81)                Time: 8659-8642 OT Time Calculation (min): 17 min Charges:  OT General Charges $OT Visit: 1 Visit OT Evaluation $OT Eval Moderate Complexity: 1 Mod  Mliss NOVAK, OTR/L Acute Rehab Services Office: [phone]   Mliss Fish 02/25/2024, 2:16 PM

## 2024-02-25 NOTE — Subjective & Objective (Signed)
 I have seen and examined this patient, and reviewed their chart. I have discussed this patient with the resident. I agree with the resident's findings, assessment and care plan.   Principal Problem:   Hypotension          - Possible causes: Cold and Wet acute on chronic systolic heart failure with AS (AV gradient 40 mmHg and AV area 1.0 cm^2) and mild-moderate MS and elevated PAP with right heart dysfunction, possible sepsis from complicated urinary tract Proteus infection, hypovolemia, or combination of all three.                  -                  - Stable eGFR c/w Stage 4 Chronic Kidney Disease on admission                                 - - Rise in serum creatinine after loop + thiazide diuresis on 2/4 for pulmonary edema seen on CT.                                  - UOP 1600 ml response                 - Rise in serum sodium 146                                  - Free water  deficit                  - SBP <90 consistently                   - Urine Culture P. Mirabilis                                   - Interestingly, no fever or elevation WBC since admission.                   - Patient is Do Not Resuscitate-limited  Active Problems: Given cold and wet heart failure would ask cardiology of additional treatment options. Acutely, trial of gentle isotonic fluid bolus with monitoring pulmonary status.       Continue Zosyn  for now. Await sensitivities  Hold on Brain MRI until more hemodynamically stable

## 2024-02-25 NOTE — Evaluation (Signed)
 Physical Therapy Evaluation Patient Details Name: [name] MRN: [id number] DOB: 1935-05-26 Today's Date: 02/25/2024  History of Present Illness  89 y.o. female presents to [facility] 02/23/24 with an unwitnessed fall at home and AMS. CTH and neck negative. Pt with sepsis, cardiogenic shock, infected L uretal stone, and hypotension. Urgent L ureteral stent and cystoscopy. PMHx: MVR on chronic a/c, Ao stenosis, HFrEF 45-50%, RHF, PAH, severe, CKD4, Chronic afib, GERD, HTN, AOCD   Clinical Impression  PTA pt lived alone and was ModI with SP cane or RW. Pt presents with deficits in cognition, generalized weakness, and impaired cardiopulmonary status. Deferred moving OOB due to hypotension upon arrival. Moved bed into chair position with pt performing counter pressure exercises and SCDs donned. At this time, recommending <3hrs post acute rehab to work towards prior level of function. Will continue to acutely.     Supine- 74/51 (60), 128 BPM Chair position- 85/56 (66), 115 BPM Chair position after exercises- 84/73 (77), 126 BPM      If plan is discharge home, recommend the following: A lot of help with walking and/or transfers;A lot of help with bathing/dressing/bathroom;Assistance with cooking/housework;Assist for transportation;Help with stairs or ramp for entrance   Can travel by private vehicle   No    Equipment Recommendations None recommended by PT     Functional Status Assessment Patient has had a recent decline in their functional status and demonstrates the ability to make significant improvements in function in a reasonable and predictable amount of time.     Precautions / Restrictions Precautions Precautions: Fall Recall of Precautions/Restrictions: Impaired Precaution/Restrictions Comments: watch BP, HR, L uretal stent Restrictions Weight Bearing Restrictions Per Provider Order: No      Mobility  Bed Mobility Overal bed mobility: Needs Assistance      General bed mobility  comments: Max A to pull self forward in bed to long sitting. Deferred EOB d/t hypotension    Transfers  General transfer comment: deferred      Balance Overall balance assessment: History of Falls       Pertinent Vitals/Pain Pain Assessment Pain Assessment: No/denies pain    Home Living Family/patient expects to be discharged to:: Private residence Living Arrangements: Alone Available Help at Discharge: Family;Available 24 hours/day Type of Home: Apartment Home Access: Level entry    Home Layout: One level Home Equipment: Shower seat;Wheelchair - manual;Grab bars - tub/shower;BSC/3in1;Rollator (4 wheels) Additional Comments: senior living apt and has 2 adult daughters that do not work that can help    Prior Function Prior Level of Function : Independent/Modified Independent    Mobility Comments: uses rollator vs cane for mobility ADLs Comments: Pt Mod I for ADLs, household IADLs. does not drive     Extremity/Trunk Assessment   Upper Extremity Assessment Upper Extremity Assessment: Defer to OT evaluation    Lower Extremity Assessment Lower Extremity Assessment: Generalized weakness    Cervical / Trunk Assessment Cervical / Trunk Assessment: Normal  Communication   Communication Communication: Impaired Factors Affecting Communication: Hearing impaired    Cognition Arousal: Alert Behavior During Therapy: WFL for tasks assessed/performed, Flat affect   PT - Cognitive impairments: Awareness, Memory, Attention, Sequencing, Initiation, Problem solving, Safety/Judgement    Following commands: Impaired Following commands impaired: Follows one step commands inconsistently, Follows one step commands with increased time     Cueing Cueing Techniques: Verbal cues, Gestural cues, Tactile cues     General Comments General comments (skin integrity, edema, etc.): Grandsons present and supportive    Exercises  General Exercises - Lower Extremity Ankle Circles/Pumps:  AROM, Both, 10 reps, Supine Straight Leg Raises: AROM, Both, 5 reps, Supine   Assessment/Plan    PT Assessment Patient needs continued PT services  PT Problem List Decreased strength;Decreased range of motion;Decreased activity tolerance;Decreased balance;Decreased mobility;Decreased cognition;Decreased safety awareness;Decreased knowledge of use of DME;Decreased knowledge of precautions       PT Treatment Interventions DME instruction;Gait training;Functional mobility training;Therapeutic activities;Therapeutic exercise;Balance training;Neuromuscular re-education;Patient/family education;Wheelchair mobility training    PT Goals (Current goals can be found in the Care Plan section)  Acute Rehab PT Goals Patient Stated Goal: to feel better PT Goal Formulation: With patient/family Time For Goal Achievement: 03/10/24 Potential to Achieve Goals: Good    Frequency Min 2X/week     Co-evaluation   Reason for Co-Treatment: For patient/therapist safety;To address functional/ADL transfers PT goals addressed during session: Strengthening/ROM OT goals addressed during session: ADL's and self-care       AM-PAC PT 6 Clicks Mobility  Outcome Measure Help needed turning from your back to your side while in a flat bed without using bedrails?: A Lot Help needed moving from lying on your back to sitting on the side of a flat bed without using bedrails?: Total Help needed moving to and from a bed to a chair (including a wheelchair)?: Total Help needed standing up from a chair using your arms (e.g., wheelchair or bedside chair)?: Total Help needed to walk in hospital room?: Total Help needed climbing 3-5 steps with a railing? : Total 6 Click Score: 7    End of Session Equipment Utilized During Treatment: Oxygen  Activity Tolerance: Other (comment) (limited by hypotension) Patient left: in bed;with call bell/phone within reach;with bed alarm set Nurse Communication: Mobility status;Other  (comment) (BP readings) PT Visit Diagnosis: Unsteadiness on feet (R26.81);Other abnormalities of gait and mobility (R26.89);Muscle weakness (generalized) (M62.81);History of falling (Z91.81)    Time: 8669-8641 PT Time Calculation (min) (ACUTE ONLY): 28 min   Charges:   PT Evaluation $PT Eval Moderate Complexity: 1 Mod   PT General Charges $$ ACUTE PT VISIT: 1 Visit       Kate ORN, PT, DPT Secure Chat Preferred  Rehab Office [phone]   Kate BRAVO [name] 02/25/2024, 4:40 PM

## 2024-02-26 ENCOUNTER — Inpatient Hospital Stay (HOSPITAL_COMMUNITY)

## 2024-02-26 LAB — CBC
HCT: 37.5 % (ref 36.0–46.0)
Hemoglobin: 10.4 g/dL — ABNORMAL LOW (ref 12.0–15.0)
MCH: 25.2 pg — ABNORMAL LOW (ref 26.0–34.0)
MCHC: 27.7 g/dL — ABNORMAL LOW (ref 30.0–36.0)
MCV: 90.8 fL (ref 80.0–100.0)
Platelets: 117 10*3/uL — ABNORMAL LOW (ref 150–400)
RBC: 4.13 MIL/uL (ref 3.87–5.11)
RDW: 20.5 % — ABNORMAL HIGH (ref 11.5–15.5)
WBC: 8.3 10*3/uL (ref 4.0–10.5)
nRBC: 0 % (ref 0.0–0.2)

## 2024-02-26 LAB — URINE CULTURE: Culture: 50000 — AB

## 2024-02-26 LAB — CULTURE, BLOOD (ROUTINE X 2)
Culture: NO GROWTH
Culture: NO GROWTH

## 2024-02-26 LAB — GLUCOSE, CAPILLARY
Glucose-Capillary: 64 mg/dL — ABNORMAL LOW (ref 70–99)
Glucose-Capillary: 94 mg/dL (ref 70–99)
Glucose-Capillary: 80 mg/dL (ref 70–99)
Glucose-Capillary: 87 mg/dL (ref 70–99)
Glucose-Capillary: 113 mg/dL — ABNORMAL HIGH (ref 70–99)

## 2024-02-26 LAB — COMPREHENSIVE METABOLIC PANEL WITH GFR
ALT: 6 U/L (ref 0–44)
AST: 16 U/L (ref 15–41)
Albumin: 2.8 g/dL — ABNORMAL LOW (ref 3.5–5.0)
Alkaline Phosphatase: 44 U/L (ref 38–126)
Anion gap: 14 (ref 5–15)
BUN: 70 mg/dL — ABNORMAL HIGH (ref 8–23)
CO2: 21 mmol/L — ABNORMAL LOW (ref 22–32)
Calcium: 9.1 mg/dL (ref 8.9–10.3)
Chloride: 109 mmol/L (ref 98–111)
Creatinine, Ser: 4.4 mg/dL — ABNORMAL HIGH (ref 0.44–1.00)
GFR, Estimated: 9 mL/min — ABNORMAL LOW
Glucose, Bld: 71 mg/dL (ref 70–99)
Potassium: 4.5 mmol/L (ref 3.5–5.1)
Sodium: 144 mmol/L (ref 135–145)
Total Bilirubin: 0.7 mg/dL (ref 0.0–1.2)
Total Protein: 8 g/dL (ref 6.5–8.1)

## 2024-02-26 LAB — HEPARIN LEVEL (UNFRACTIONATED)
Heparin Unfractionated: 0.24 [IU]/mL — ABNORMAL LOW (ref 0.30–0.70)
Heparin Unfractionated: 0.31 [IU]/mL (ref 0.30–0.70)

## 2024-02-26 LAB — PHOSPHORUS: Phosphorus: 5 mg/dL — ABNORMAL HIGH (ref 2.5–4.6)

## 2024-02-26 LAB — MAGNESIUM: Magnesium: 2.6 mg/dL — ABNORMAL HIGH (ref 1.7–2.4)

## 2024-02-26 MED ORDER — ENSURE PLUS HIGH PROTEIN PO LIQD: 237.0000 mL | Freq: Two times a day (BID) | ORAL | Status: AC

## 2024-02-26 MED ORDER — DEXTROSE 50 % IV SOLN
INTRAVENOUS | Status: AC
  Administered 2024-02-26: 50 mL
  Filled 2024-02-26: qty 50

## 2024-02-26 MED ORDER — POLYVINYL ALCOHOL 1.4 % OP SOLN
1.0000 [drp] | OPHTHALMIC | Status: AC | PRN
  Filled 2024-02-26: qty 15

## 2024-02-26 NOTE — Plan of Care (Addendum)
 Upon chart review, noted patient was placed on BiPAP at 2318 02/24/24 without documentation of reason. Went to evaluate patient at bedside.   BP 102/80 (BP Location: Right Arm)   Pulse (!) 57   Temp 98.1 F (36.7 C) (Axillary)   Resp (!) 26   Wt 64.9 kg   SpO2 100%   BMI 21.76 kg/m    General: No acute distress.  CV: Tachycardic, irregular rate and rhythm. All extremities warm.  Pulm: No signs of respiratory distress. Bipap in place. CTAB of anterior fields. Skin:  Warm, dry. Neuro: AxOx4.   Patient appears relatively stable at this time, no significant change from prior in the night. Patient made NPO while on Bipap.   Inquired with nursing and nursing also not aware of why patient was placed back on bipap.   Attempted to reach respiratory therapy department for further information. Reached voicemail, left generic message requesting callback.   Addendum 02/26/24 2035 Spoke with respiratory therapist who worked with patient earlier in the night. Per this RT, patient was placed on bipap because there was an order in for bipap and when RT came by to see the patient, RT asked if patient wanted the mask on and the patient seemed to nod affirmatively so the mask was placed. Per this RT, patient was not demonstrating signs of respiratory distress at the time of bipap placement.  Appreciate RT clarification. Adjusted former Bipap order to PRN for respiratory distress. Spoke with patient nurse to provide updates, requested nursing to communicate with floor RT about weaning patient from bipap as able. Patient will remain NPO in the meantime.

## 2024-02-26 NOTE — Assessment & Plan Note (Signed)
(  RESOLVED) 5.3 initially, down to 4.8 on 2/4 after heavy IV lasix .  Will continue to monitor. - AM CMP

## 2024-02-26 NOTE — Assessment & Plan Note (Signed)
 Vascular congestion and small plueral effusion seen on CXR. Echo showed EF 30-35%, increased atrial pressures, tricuspid regurgitation. Pt given heavy diuresis night of 02/23/2024 with ~1L urine output.  - BiPAP PRN - Strict I&Os - Daily weights - Appreciate Cardiology recs  - Avoid pressors

## 2024-02-26 NOTE — Assessment & Plan Note (Signed)
 Bladder is likely source, UA Proteus sensitivities came back with intermediate resistance to Zosyn . CTAP showed 4X9 mm calculus in left renal pelvis region and dilated extrarenal pelvis and small posterior inferior fluid, possibly 2/2 sequelae of focal calyceal rupture. Resistance to urine output suspected to be pre-renal etiology. Cr worsening daily, 4.4 on 2/6 from 3.07 on admission  - Urology consulted, appreciate recs  - Stopped Vancomycin  and Zosyn  - Starting Rocephin , for 5-7 days - Blood culture x 2, NG x3days - UA: Proteus Mirabilis with some resistances including Zosyn  - AM CBC, CMP - Consult Palliative care, appreciate recs

## 2024-02-26 NOTE — Assessment & Plan Note (Signed)
 Holding all other home meds until swallow eval is able to be completed.

## 2024-02-26 NOTE — Progress Notes (Signed)
 Heart Failure Navigator Progress Note  Assessed for Heart & Vascular TOC clinic readiness.  Patient does not meet criteria due to per MD, patient with poor prognosis. No HF TOC. .   Navigator will sign off at this time.    Stephane Haddock, BSN, Scientist, Clinical (histocompatibility And Immunogenetics) Only

## 2024-02-26 NOTE — Consult Note (Addendum)
 " Palliative Medicine Inpatient Consult Note  Consulting Provider:  Lonnie Earnest, MD   Reason for consult:   Palliative Care Consult Services Palliative Medicine Consult  Reason for Consult? goals pf care   02/26/2024  HPI:  Per intake H&P -->   [name] is an 89 year old female with a past medical history significant for atrial fibrillation, combined systolic and diastolic heart failure, asthma, hemorrhoids causing GI bleed, GERD, osteoporosis, and right bundle branch block.  Alizandra was admitted to the hospital on 3 February in the setting of severe sepsis from a kidney stone and urinary source.  She had a left ureteral stent placed during hospitalization.  Her continued hospitalization has been complicated by hypotensive episodes from sepsis, diastolic heart failure, and A-fib with RVR.  The palliative care team has been asked to support additional goals of care conversations in the setting of multisystem organ dysfunction.  Clinical Assessment/Goals of Care:  I have reviewed medical records including EPIC notes of Dr. Diona, Cordella Abbott-pharmacist, Darice Dixons, Dr. Jeffrie, Consuelo Deblois -SLP, labs inclusive of basic metabolic panel and complete blood cell count from today (2/6) and imaging -inclusive of swallowing study from today, received report from bedside RN, Tim, assessed the patient who is lying in bed in no acute distress.    I met with patient's daughter, [name], god daughter, Rilla, and great granddaughter, Rolin to further discuss diagnosis prognosis, GOC, EOL wishes, disposition and options.   I introduced Palliative Medicine as specialized medical care for people living with serious illness. It focuses on providing relief from the symptoms and stress of a serious illness. The goal is to improve quality of life for both the patient and the family.  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  Social History:  [name] is from [place], [place]  however lived the majority of her adult life in [place], [place] .  She moved back to the [place] area when her father was still alive.  She had 6 children, 8 grandchildren, and greater than 15 great-grandchildren.  She shares that she had been separated from her husband many years ago.  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  For the majority of patient's life though she was the primary caregiver to her children.  She gets enjoyment out of doing crossword puzzles and word search puzzles.  She is a woman of faith practicing within the [employer] denomination.  Functional and Nutritional State:  Preceding hospitalization [name] had been living in a senior development facility.  She has required a rollator for mobility.  She required help with bathing and was able to dress herself.  She had been eating well preceding admission though since hospitalization has not been eating well.  Advance Directives:  A detailed discussion was had today regarding advanced directives.  [name] does not have advanced directives however her daughter [name] would be interested in her completing at the very least her healthcare power of attorney while here.  I was able to talk to [name] who does endorse she would want her daughter, [name] to be her surrogate management consultant.  Code Status:  Concepts specific to code status, artifical feeding  and hydration, continued IV antibiotics and rehospitalization was had.  The difference between a aggressive medical  intervention path  and a palliative comfort care path for this patient at this time was had.   Encouraged patient/family to consider DNR/DNI status understanding evidenced based poor outcomes in similar hospitalized patient, as the cause of arrest is likely associated with advanced chronic/terminal illness rather than an easily reversible acute cardio-pulmonary event. I explained that DNR/DNI does not change the medical plan and it only comes into effect after a person has arrested (died).  It is a protective measure to keep us  from harming the patient in their last moments of life.  Patient's daughter [name] and Jaiona were agreeable to DNR/DNI with understanding that patient would not receive CPR, defibrillation, ACLS medications, or intubation.   Discussion:  [name] and I reviewed [name]'s clinical concerns at this time.  We discussed her complicated hospital stay in the setting of a significant UTI from a kidney stone for which a stent was placed.  We reviewed that the patient is on broader spectrum antibiotics to try to help with this however has had fluctuant blood pressures and more often leave them running in the lower range.  We reviewed the volume resuscitation patient has received and how this is complicated by the fact that she has underlying heart failure therefore the amount of fluid resuscitation she receives does have the potential to make her volume overloaded.  Patient's daughter shares understanding and does endorse if needed she is open to her mother going to the intensive care unit for short-term medication to bring her blood pressure up.  She shares beyond that intervention she would not want anything else however she feels that this could improve her mother's potential outcomes and allow her to get through this acute phase of illness she would be open to it.  We discussed [name]'s overall trajectory should her kidneys continue to did decline and fail.  We reviewed should [name] go into kidney  failure that dialysis would be unlikely to improve her outcomes.  Patient's daughter is clear that she would not want that anyway should it be offered.  We discussed potential outcomes should Shacoya's condition continue as it has been which has been fluctuant.  Patient's daughter shares over the last 3 days she has noted gradual improvement in Crellin although her numbers does not show that she knows her mom and feels like she is doing better.  She would like to continue current measures to see if her mother may be able to recover from this acute bout of sepsis.  If for any reason Glennys should decline or deteriorate then [name] would be open to more of a comfort oriented approach to care.  I explained exactly what that would mean. We talked about transition to comfort measures in house and what that would entail inclusive of medications to control pain, dyspnea, agitation, nausea, itching, and hiccups.  We discussed stopping all uneccessary measures such as cardiac monitoring, blood draws, needle sticks, and frequent vital signs.   For now continue to allow time for outcomes.  Discussed the importance of continued conversation with family and their  medical providers regarding overall plan of care and treatment options, ensuring decisions are within the context of the patients values and GOCs.  Decision Maker: Delores [name] Daughter, Emergency Contact [phone] [facility] Phone)   SUMMARY OF RECOMMENDATIONS   DNAR/DNI --> patient's daughter would be open to intensive care unit stay for short-term pressors if identified to potentially help  patient  Patient would not have wanted dialysis treatment  Allow time for outcomes  Discussed best case and worst-case scenarios in Shaunda's current clinical state  Should Kitana continue to decline reviewed the option of comfort care which patient's family would be open to as long as we have exhausted all of the above options  Ongoing palliative care support  Code  Status/Advance Care Planning: DNAR/DNI  Palliative Prophylaxis:  Aspiration, Bowel Regimen, Delirium Protocol, Frequent Pain Assessment, Oral Care, Palliative Wound Care, and Turn Reposition  Additional Recommendations (Limitations, Scope, Preferences): Continue current care  Psycho-social/Spiritual:  Desire for further Chaplaincy support: Not at this time Additional Recommendations: Education on disease burden   Prognosis: Limited overall in the setting of acute illness and multisystem organ dysfunction as a high mortality risk  Discharge Planning: Discharge plan to be determined  Vitals:   02/26/24 0333 02/26/24 1200  BP:  (!) 91/49  Pulse:  (!) 50  Resp: 18 20  Temp:  98.1 F (36.7 C)  SpO2:  93%    Intake/Output Summary (Last 24 hours) at 02/26/2024 1419 Last data filed at 02/25/2024 2000 Gross per 24 hour  Intake 1041.03 ml  Output 400 ml  Net 641.03 ml   Last Weight  Most recent update: 02/26/2024  4:18 AM    Weight  64.9 kg (143 lb 1.3 oz)             LABS: CBC:    Component Value Date/Time   WBC 8.3 02/26/2024 0314   HGB 10.4 (L) 02/26/2024 0314   HGB 10.5 (L) 06/26/2023 1007   HCT 37.5 02/26/2024 0314   HCT 33.2 (L) 06/26/2023 1007   HCT 26 (A) 05/30/2015 0000   PLT 117 (L) 02/26/2024 0314   PLT 203 06/26/2023 1007   MCV 90.8 02/26/2024 0314   MCV 88 06/26/2023 1007   MCV 83.5 05/30/2015 0000   NEUTROABS 7.1 02/23/2024 1139   LYMPHSABS 0.8 02/23/2024 1139   MONOABS 0.3 02/23/2024 1139   EOSABS 0.0 02/23/2024 1139   BASOSABS 0.0 02/23/2024 1139   Comprehensive Metabolic Panel:    Component Value Date/Time   NA 144 02/26/2024 0314   NA 140 06/26/2023 1007   NA 138 05/30/2015 0000   K 4.5 02/26/2024 0314   K 3.9 05/30/2015 0000   CL 109 02/26/2024 0314   CL 105 05/30/2015 0000   CO2 21 (L) 02/26/2024 0314   CO2 20 05/30/2015 0000   BUN 70 (H) 02/26/2024 0314   BUN 46 (H) 06/26/2023 1007   BUN 27 (A) 05/30/2015 0000   CREATININE 4.40 (H)  02/26/2024 0314   CREATININE 1.26 (H) 10/24/2015 0954   GLUCOSE 71 02/26/2024 0314   CALCIUM 9.1 02/26/2024 0314   CALCIUM 8.8 05/30/2015 0000   AST 16 02/26/2024 0314   ALT 6 02/26/2024 0314   ALKPHOS 44 02/26/2024 0314   BILITOT 0.7 02/26/2024 0314   BILITOT 0.6 07/20/2019 1337   PROT 8.0 02/26/2024 0314   PROT 8.1 07/20/2019 1337   ALBUMIN 2.8 (L) 02/26/2024 0314   ALBUMIN 4.6 07/20/2019 1337    Gen: Elderly African-American female chronically ill-appearing HEENT: Dry mucous membranes CV: Irregular rate and rhythm  PULM: On 3 L nasal cannula breathing is even and unlabored ABD: soft/nontender EXT: No edema Neuro: Alert and oriented x3 very hard of hearing  PPS: 30%   This conversation/these recommendations were discussed with patient primary care team, Dr. McDiarmid ______________________________________________________ Rosaline Becton [facility] Palliative Medicine Team Team Cell Phone:  [phone] Please utilize secure chat with additional questions, if there is no response within 30 minutes please call the above phone number  I personally spent a total of 80 minutes in the care of the patient today including preparing to see the patient, getting/reviewing separately obtained history, performing a medically appropriate exam/evaluation, counseling and educating, referring and communicating with other health care professionals, documenting clinical information in the EHR, independently interpreting results, communicating results, and coordinating care.  "

## 2024-02-26 NOTE — Assessment & Plan Note (Signed)
 Patient in A-fib with RVR with rates in the 140s as well as wide complex tachycardia. Home medication diltiazem  180 mg daily.  Initial troponin 63, repeat 70.  EKG showed tachycardic, A-fib, RBBB.  Patient has known RBBB. Suspect partly d/t pain and infection.  - Cardiology consulted, appreciate recommendations  - Pt cannot tolerate dilt or amio. They determining if fluid boluses can be appropriate given sepsis. - Heparin  GTT - Magnesium  - Cardiac telemetry indefinitely

## 2024-02-26 NOTE — Assessment & Plan Note (Signed)
 Unwitnessed fall at home, patient on warfarin.  However, unclear compliance as of late.  INR will be subtherapetuic while admitted because of switch to heparin ; INR goal 2.5-3.0 prior on Warfarin prior to DC for mechanical heart valve. Lovenox  not an option. CK 140. Now stable enough for MRI. - Heparin  drip per pharmacy  - Fall precautions - Pain: Tylenol , pt has not reported significant pain. Avoiding opiates if possible.

## 2024-02-26 NOTE — Progress Notes (Signed)
 "  Progress Note  [name] Date of Encounter: 02/26/2024 [facility] Cardiologist: Oneil Parchment, MD   Interval Summary   Frail, ill appearing. Hypotensive   Vital Signs Vitals:   02/26/24 0001 02/26/24 0322 02/26/24 0333 02/26/24 0417  BP: (!) 86/67 98/75    Pulse:      Resp: (!) 21 (!) 22 18   Temp:  98.1 F (36.7 C)    TempSrc:  Oral    SpO2:  98%    Weight:    64.9 kg    Intake/Output Summary (Last 24 hours) at 02/26/2024 0847 Last data filed at 02/25/2024 2000 Gross per 24 hour  Intake 1041.03 ml  Output 400 ml  Net 641.03 ml      02/26/2024    4:17 AM 02/25/2024    3:45 AM 02/24/2024    4:00 AM  Last 3 Weights  Weight (lbs) 143 lb 1.3 oz 143 lb 1.3 oz 143 lb 11.8 oz  Weight (kg) 64.9 kg 64.9 kg 65.2 kg      Telemetry/ECG  AFIB RVR 140-150 - Personally Reviewed  Physical Exam  GEN: Frail, ill-appearing Neck: No JVD Cardiac: Mechanical S1 click irregularly irregular tachy, 2/6 systolic murmur,no rubs, or gallops.  Respiratory: Clear to auscultation bilaterally. GI: Soft, nontender, non-distended  MS: No edema  Assessment & Plan   89 year old with respiratory failure, off and on BiPAP with urosepsis Proteus, atrial fibrillation with rapid ventricular response bundle branch block, sepsis/septic shock/cardiogenic shock.  Mechanical mitral valve Saint Jude.  Chronic systolic heart failure.  Proteus urosepsis - Now on Zosyn .  Left ureteral stent placed this admission.  Septic shock/cardiogenic component/chronic systolic heart failure - Patient DNR.  Would avoid pressors.  Hypotensive, unable to utilize goal-directed medical therapy. -Recommend palliation, continued medical management.  Mechanical mitral valve - Currently on IV heparin .    Atrial fibrillation RVR with bundle branch block - Unable to tolerate IV amiodarone  for a while yesterday secondary to significant hypotension.  Gentle fluid bolus administered given underlying sepsis.   Amiodarone  drip currently on hold.  Secondary pulmonary hypertension - Fairly chronic, seen on right and left heart catheterization back in 2023.  No significant change.  End-stage.  Moderate aortic valve stenosis - Should be of minimal clinical concern at this point.  Aortic valve mean gradient 24 mmHg  Encephalopathy - Has shown slight improvement with IV antibiotics.  Acute kidney injury - Creatinine now up to 4.4.  In April, creatinine approximately 2. she is not a candidate for dialysis.  I think without significant improvement, she has grave prognosis.  Would recommend palliative care consultation for further goals of care discussion   For questions or updates, please contact [facility] Please consult [email or web address] for contact info under        CRITICAL CARE TIME Performed by: Oneil Parchment, MD   Total critical care time: 40 minutes  Critical care time was exclusive of separately billable procedures and treating other patients.  Critical care was necessary to treat or prevent imminent or life-threatening deterioration.  Critical care was time spent personally by me on the following activities: development of treatment plan with patient and/or surrogate as well as nursing, discussions with consultants, evaluation of patient's response to treatment, examination of patient, obtaining history from patient or surrogate, ordering and performing treatments and interventions, ordering and review of laboratory studies, ordering and review of radiographic studies, pulse oximetry and re-evaluation of patient's condition. Signed, Oneil Parchment, MD   "

## 2024-02-26 NOTE — Assessment & Plan Note (Signed)
 Unclear etiology, but improving.  Differential includes sepsis, stroke, and arrhythmia as above.  CT head showed no acute intracranial abnormality, moderate chronic small vessel ischemia, and cerebral atrophy. - MRI once patient has improved clinically - Delirium precautions - Neurochecks every 4 hours

## 2024-02-26 NOTE — Plan of Care (Signed)
   Problem: Clinical Measurements: Goal: Respiratory complications will improve Outcome: Progressing

## 2024-02-26 NOTE — Assessment & Plan Note (Signed)
 Likely new from admission. Swallow evaluation complete, can be on diet dysphagia 2. - Holding on MRI Brain w/o contrast

## 2024-02-26 NOTE — TOC Initial Note (Signed)
 Transition of Care [facility]) - Initial/Assessment Note    Patient Details  Name: [name] MRN: [id number] Date of Birth: 30-Mar-1935  [facility] CM/SW Contact:    Luise JAYSON Pan, LCSWA Phone Number: 02/26/2024, 3:27 PM  Clinical Narrative:   Palliative following at this time. Inpatient Care Management will continue to follow for the determination of the potential discharge plan.   Per chart review, patient is from home alone. Patients daughter checks on her. Patient has PCP and insurance on file.           Expected Discharge Plan:  (TBD) Barriers to Discharge: Continued Medical Work up   Patient Goals and CMS Choice Patient states their goals for this hospitalization and ongoing recovery are:: To get better          Expected Discharge Plan and Services In-house Referral: Clinical Social Work, Orthoptist, Hospice / Palliative Care Discharge Planning Services: CM Consult Post Acute Care Choice:  (TBD) Living arrangements for the past 2 months: Apartment                                      Prior Living Arrangements/Services Living arrangements for the past 2 months: Apartment Lives with:: Self Patient language and need for interpreter reviewed:: Yes Do you feel safe going back to the place where you live?: Yes      Need for Family Participation in Patient Care: Yes (Comment) Care giver support system in place?: Yes (comment) Current home services: DME (RW, Wheelchair, shower chair, BSC) Criminal Activity/Legal Involvement Pertinent to Current Situation/Hospitalization: No - Comment as needed  Activities of Daily Living   ADL Screening (condition at time of admission) Independently performs ADLs?: No Does the patient have a NEW difficulty with bathing/dressing/toileting/self-feeding that is expected to last >3 days?: Yes (Initiates electronic notice to provider for possible OT consult) Does the patient have a NEW difficulty with getting in/out of  bed, walking, or climbing stairs that is expected to last >3 days?: Yes (Initiates electronic notice to provider for possible PT consult) Does the patient have a NEW difficulty with communication that is expected to last >3 days?: Yes (Initiates electronic notice to provider for possible SLP consult) Is the patient deaf or have difficulty hearing?: Yes Does the patient have difficulty seeing, even when wearing glasses/contacts?: Yes Does the patient have difficulty concentrating, remembering, or making decisions?: Yes  Permission Sought/Granted Permission sought to share information with : Family Supports Permission granted to share information with : Yes, Verbal Permission Granted  Share Information with NAME: Delores Ee  Daughter, Emergency Contact  [phone]           Emotional Assessment Appearance:: Appears stated age Attitude/Demeanor/Rapport: Engaged, Lethargic Affect (typically observed): Pleasant Orientation: : Oriented to Self Alcohol  / Substance Use: Not Applicable Psych Involvement: No (comment)  Admission diagnosis:  Kidney stone [N20.0] Fall [W19.XXXA] Atrial fibrillation, rapid (HCC) [I48.91] Fall, initial encounter [W19.XXXA] Patient Active Problem List   Diagnosis Date Noted   Dysarthria 02/25/2024   Mitral stenosis 02/25/2024   Aortic stenosis 02/25/2024   Hypotension 02/25/2024   Cardiogenic shock (HCC) 02/25/2024   Acute cystitis without hematuria 02/25/2024   Bladder infection 02/24/2024   Kidney stone 02/23/2024   Acute on chronic systolic heart failure (HCC) 02/23/2024   Atrial fibrillation, rapid (HCC) 02/23/2024   Warfarin anticoagulation 02/23/2024   AMS (altered mental status) 02/23/2024   Hyperkalemia 02/23/2024  Pulmonary hypertension (HCC) 02/23/2024   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) 02/23/2024   Chronic health problem 08/17/2023   Abnormal INR 08/17/2023   Atrial fibrillation with RVR (HCC) 08/15/2023   Dysuria  08/15/2023   Leukocytosis 08/15/2023   Sepsis (HCC) 08/15/2023   Malnutrition of moderate degree 06/06/2022   Prolonged QT interval 05/11/2022   Femur fracture, right (HCC) 04/08/2022   Closed nondisplaced fracture of lateral end of right clavicle 04/08/2022   Fall 04/08/2022   Closed subcapital fracture of right femur (HCC) 04/08/2022   History of urinary retention 09/18/2021   Chronic combined systolic and diastolic congestive heart failure (HCC) 04/17/2021   AKI (acute kidney injury)    Acute on chronic systolic congestive heart failure (HCC)    Diverticular disease of colon 04/25/2020   Gastro-esophageal reflux disease without esophagitis 04/25/2020   History of colonic polyps 04/25/2020   Spinal stenosis 04/25/2020   Weakness 04/25/2020   H/O right hemicolectomy 06/16/2018   Abnormal CT scan, liver 06/16/2018   Chronic atrial fibrillation (HCC)    Chronic kidney disease (CKD) 05/31/2015   Hx of Tibial plateau fracture 2017 05/22/2015   Long term current use of anticoagulant therapy 05/03/2010   VENTRICULAR TACHYCARDIA 02/16/2008   History of mitral valve replacement - St Jude mechanical valve 02/16/2008   RHINITIS, ALLERGIC 03/19/2006   Osteoporosis 03/19/2006   PCP:  [name] Camie CROME, MD Pharmacy:   [id number] [place], [place] - [address] AT [facility] OF [address] [place] [address] Phone: [phone] Fax: [phone]  [facility] [address] [place] [address] Phone: [phone] Fax: [phone]     Social Drivers of Health (SDOH) Social History: SDOH Screenings   Food Insecurity: Patient Unable To Answer (02/24/2024)  Housing: Unknown (02/24/2024)  Transportation Needs: Patient Unable To Answer (02/24/2024)  Utilities: Patient Unable To Answer (02/24/2024)  Alcohol  Screen: Low Risk (12/31/2023)  Depression (PHQ2-9): Low Risk (12/31/2023)  Financial Resource  Strain: Low Risk (12/31/2023)  Physical Activity: Inactive (12/31/2023)  Social Connections: Unknown (02/24/2024)  Stress: No Stress Concern Present (12/31/2023)  Tobacco Use: Medium Risk (02/23/2024)  Health Literacy: Adequate Health Literacy (12/31/2023)   SDOH Interventions:     Readmission Risk Interventions     No data to display

## 2024-02-26 NOTE — Progress Notes (Signed)
 "    Daily Progress Note Intern Pager: [phone]  [name] Medical record number: [id number] Date of birth: 01-21-1935 Age: 89 y.o. Gender: female  Primary Care Provider: [name] Camie CROME, MD Consultants: Urology, Cardiology Code Status: DNR  Pt Overview and Major Events to Date:  2/3 - admitted, L Ureteral stent placement  Medical Decision Making:  [name] is an 89 yo F presenting with Sepsis from bladder infection, heart failure, and kidney failure. The necessary diuresis is causing harm to her kidneys. She is also receiving antibiotics for her infection.   Acute on Chronic Systolic heart failure, exacerbated by sepsis, with CKD have required significant diuresis which is also causing harm to the kidneys. Prognosis is poor. Palliative has been consulted. Family is making efforts to travel to the hospital from out of state.  Assessment & Plan Sepsis (HCC) Kidney stone Chronic kidney disease (CKD) Bladder infection AKI (acute kidney injury) Acute cystitis without hematuria Bladder is likely source, UA Proteus sensitivities came back with intermediate resistance to Zosyn . CTAP showed 4X9 mm calculus in left renal pelvis region and dilated extrarenal pelvis and small posterior inferior fluid, possibly 2/2 sequelae of focal calyceal rupture. Resistance to urine output suspected to be pre-renal etiology. Cr worsening daily, 4.4 on 2/6 from 3.07 on admission  - Urology consulted, appreciate recs  - Stopped Vancomycin  and Zosyn  - Starting Rocephin , for 5-7 days - Blood culture x 2, NG x3days - UA: Proteus Mirabilis with some resistances including Zosyn  - AM CBC, CMP - Consult Palliative care, appreciate recs Acute on chronic systolic congestive heart failure (HCC) Acute on chronic systolic heart failure (HCC) Pulmonary hypertension (HCC) 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) Mitral stenosis Aortic stenosis Hypotension Cardiogenic shock  (HCC) Vascular congestion and small plueral effusion seen on CXR. Echo showed EF 30-35%, increased atrial pressures, tricuspid regurgitation. Pt given heavy diuresis night of 02/23/2024 with ~1L urine output.  - BiPAP PRN - Strict I&Os - Daily weights - Appreciate Cardiology recs  - Avoid pressors Fall History of mitral valve replacement - St Jude mechanical valve Warfarin anticoagulation Unwitnessed fall at home, patient on warfarin.  However, unclear compliance as of late.  INR will be subtherapetuic while admitted because of switch to heparin ; INR goal 2.5-3.0 prior on Warfarin prior to DC for mechanical heart valve. Lovenox  not an option. CK 140. Now stable enough for MRI. - Heparin  drip per pharmacy  - Fall precautions - Pain: Tylenol , pt has not reported significant pain. Avoiding opiates if possible. Dysarthria Likely new from admission. Swallow evaluation complete, can be on diet dysphagia 2. - Holding on MRI Brain w/o contrast Atrial fibrillation with RVR (HCC) Patient in A-fib with RVR with rates in the 140s as well as wide complex tachycardia. Home medication diltiazem  180 mg daily.  Initial troponin 63, repeat 70.  EKG showed tachycardic, A-fib, RBBB.  Patient has known RBBB. Suspect partly d/t pain and infection.  - Cardiology consulted, appreciate recommendations  - Pt cannot tolerate dilt or amio. They determining if fluid boluses can be appropriate given sepsis. - Heparin  GTT - Magnesium  - Cardiac telemetry indefinitely AMS (altered mental status) Unclear etiology, but improving.  Differential includes sepsis, stroke, and arrhythmia as above.  CT head showed no acute intracranial abnormality, moderate chronic small vessel ischemia, and cerebral atrophy. - MRI once patient has improved clinically - Delirium precautions - Neurochecks every 4 hours Hyperkalemia (RESOLVED) 5.3 initially, down to 4.8 on 2/4 after heavy IV  lasix .  Will continue to monitor. - AM CMP  Chronic  health problem Holding all other home meds until swallow eval is able to be completed.  FEN/GI: NPO PPx: Heparin  per pharmacy Dispo:TBD pending clinical improvement . Barriers include clinical stability Prognosis: Guarded   Subjective:  Pt was A&Ox3, much improved since admission. Dysarthria improved and reports that she is not in any pain. We discussed her prognosis and that palliative care would come by. She voiced her understanding.  Objective: Temp:  [97.5 F (36.4 C)-98.1 F (36.7 C)] 98.1 F (36.7 C) (02/06 1200) Pulse Rate:  [50-118] 50 (02/06 1200) Resp:  [14-26] 20 (02/06 1200) BP: (69-103)/(46-80) 91/49 (02/06 1200) SpO2:  [93 %-100 %] 93 % (02/06 1200) FiO2 (%):  [36 %] 36 % (02/05 2106) Weight:  [64.9 kg] 64.9 kg (02/06 0417)  Physical Exam Vitals and nursing note reviewed.  Constitutional:      General: She is not in acute distress.    Appearance: She is ill-appearing.  Cardiovascular:     Rate and Rhythm: Normal rate and regular rhythm.     Heart sounds: Murmur heard.  Pulmonary:     Effort: Pulmonary effort is normal.     Comments: On [place] Abdominal:     General: Bowel sounds are normal.  Musculoskeletal:     Right lower leg: No edema.     Left lower leg: No edema.  Skin:    General: Skin is warm and dry.  Neurological:     Mental Status: She is alert.     Laboratory: Most recent CBC Lab Results  Component Value Date   WBC 8.3 02/26/2024   HGB 10.4 (L) 02/26/2024   HCT 37.5 02/26/2024   MCV 90.8 02/26/2024   PLT 117 (L) 02/26/2024   Most recent BMP    Latest Ref Rng & Units 02/26/2024    3:14 AM  BMP  Glucose 70 - 99 mg/dL 71   BUN 8 - 23 mg/dL 70   Creatinine 9.55 - 1.00 mg/dL 5.59   Sodium 864 - 854 mmol/L 144   Potassium 3.5 - 5.1 mmol/L 4.5   Chloride 98 - 111 mmol/L 109   CO2 22 - 32 mmol/L 21   Calcium 8.9 - 10.3 mg/dL 9.1     Imaging/Diagnostic Tests: Radiologist Impression:  No results found.  Lera Nancyann NOVAK,  DO 02/26/2024, 1:27 PM  PGY-1, [facility] FPTS Intern pager: [phone], text pages welcome Secure chat group [facility] Teaching Service   "

## 2024-02-26 NOTE — Progress Notes (Signed)
 Modified Barium Swallow Study  Patient Details  Name: [name] MRN: [id number] Date of Birth: March 20, 1935  Today's Date: 02/26/2024  Modified Barium Swallow completed.  Full report located under Chart Review in the Imaging Section.  History of Present Illness 89 y.o. female presents to [facility] 02/23/24 with an unwitnessed fall at home and AMS. CTH and neck negative. Pt with sepsis, cardiogenic shock, infected L uretal stone, and hypotension. Urgent L ureteral stent and cystoscopy. Pt with new dysarthria s/p fall. PMHx: MVR on chronic a/c, Ao stenosis, HFrEF 45-50%, RHF, PAH, severe, CKD4, Chronic afib, GERD, HTN, AOCD   Clinical Impression Pt demonstrates a mild oropharyngeal dysphagia with low risk of aspiration when mentation is altered or precautions are not in place. Recommend pt resume a minced and moist solid food diet with thin liquids, given with upright positioning, small sips with supervision. Pills given crushed in puree. Occasional throat clear or cough expected. If pt coughing severely and consistently, consider temporary use of thickened liquids until condition improves.  Pt has mostly consistent airway protection with thin liquids and solids though there are instances of trace penetration or aspiration due to premature spillage or late airway closure with thin and nectar thick liquids. Pt does have prolonged effortful mastication, but pharyngeal efficiency is adequate, residue is minimal.    Factors that may increase risk of adverse event in presence of aspiration Noe & Lianne 2021): Reduced cognitive function;Frail or deconditioned;Inadequate oral hygiene  Swallow Evaluation Recommendations Recommendations: PO diet PO Diet Recommendation: Dysphagia 2 (Finely chopped);Thin liquids (Level 0) Liquid Administration via: Straw;Cup Medication Administration: Crushed with puree Supervision: Full supervision/cueing for swallowing strategies;Staff to assist with  self-feeding Swallowing strategies  : Slow rate;Small bites/sips;Minimize environmental distractions;Clear throat intermittently Oral care recommendations: Oral care BID (2x/day) Caregiver Recommendations: Have oral suction available   Consuelo Fort, MA CCC-SLP  Acute Rehabilitation Services Secure Chat Preferred Office [phone]    Keyairra Kolinski, Consuelo Fitch 02/26/2024,10:55 AM

## 2024-02-26 NOTE — Progress Notes (Signed)
 This chaplain responded to PMT NP-Michelle consult for creating the Pt. HCPOA. The Pt. is not completing a Living Will. The Pt. is ending her visit with family. The Pt. daughter-[name] remained at the bedside. AD education was completed with the Pt. and [name].   The chaplain understands the Pt. chooses [name] Daring to serve as HCPOA. The chaplain is present with the Pt., notary, and witnesses for the notarizing of the Pt. AD.  The chaplain gave the Pt. the original AD along with one copy. The chaplain scanned the Pt. AD into the Pt. EMR.  This chaplain is available for F/U spiritual care as needed.  Chaplain Leeroy Hummer [phone]

## 2024-02-26 NOTE — Progress Notes (Signed)
 ANTICOAGULATION CONSULT NOTE  Pharmacy Consult for Heparin  Indication: atrial fibrillation/St Jude MVR  Allergies[1]  Patient Measurements: Height: 5' 8 (172.7 cm) Weight: 64.9 kg (143 lb 1.3 oz) IBW/kg (Calculated) : 63.9 Heparin  Dosing Weight: 65 kg  Vital Signs: Temp: 98.1 F (36.7 C) (02/06 1200) Temp Source: Oral (02/06 1200) BP: 91/49 (02/06 1200) Pulse Rate: 50 (02/06 1200)  Labs: Recent Labs    02/23/24 1734 02/24/24 0000 02/24/24 0239 02/24/24 0832 02/24/24 1836 02/25/24 0211 02/26/24 0314 02/26/24 1138  HGB  --    < > 8.6*  --   --  9.5* 10.4*  --   HCT  --   --  30.7*  --   --  33.4* 37.5  --   PLT  --   --  108*  --   --  107* 117*  --   LABPROT  --   --  21.3*  --   --  19.8*  --   --   INR  --   --  1.7*  --   --  1.6*  --   --   HEPARINUNFRC  --   --   --  0.23*   < > 0.39 0.24* 0.31  CREATININE 3.33*   < >  --  3.42*  --  3.71* 4.40*  --   CKTOTAL 140  --   --   --   --   --   --   --    < > = values in this interval not displayed.    Estimated Creatinine Clearance: 9.3 mL/min (A) (by C-G formula based on SCr of 4.4 mg/dL (H)).   Assessment: 89 yo F with a medical history significant for atrial fibrillation and history of mMVR on warfarin PTA (INR goal 2.5-3.5). Per [facility] clinic note, PTA warfarin regimen is 2.5 mg MWF, 5 mg all other days. Patient now s/p cystoscopy and ureteral stent placement. Pharmacy consulted to manage heparin  while warfarin is held.  Heparin  level at lower end of goal at current rate 1400 units/hr. No issues with infusion or s/sx bleeding reported.  Goal of Therapy:  Heparin  level 0.3-0.7 units/ml Monitor platelets by anticoagulation protocol: Yes   Plan:  Increase heparin  to 1450 units/hr to maintain goal Check heparin  level daily while on heparin  Continue to monitor H&H and platelets Will order INR in AM - follow-up ability to resume PO anticoagulation  Thank you for allowing pharmacy to be a part of this patients  care.  [employer], Pharm.D., BCPS Clinical Pharmacist Clinical phone for 02/26/2024 from 7:30-3:00 is [phone].  **Pharmacist phone directory can be found on [email or web address] listed under [facility].  02/26/2024 1:55 PM     [1]  Allergies Allergen Reactions   Iodinated Contrast Media Rash    Pt stated in the past had broken out in a rash on back and itching.

## 2024-02-26 NOTE — Progress Notes (Signed)
 ANTICOAGULATION CONSULT NOTE Pharmacy Consult for Heparin  Indication: atrial fibrillation/St Jude MVR Brief A/P: Heparin  level subtherapeutic Increase Heparin  rate  Allergies[1]  Patient Measurements: Weight: 64.9 kg (143 lb 1.3 oz) Heparin  Dosing Weight: 65 kg  Vital Signs: Temp: 98.1 F (36.7 C) (02/06 0322) Temp Source: Oral (02/06 0322) BP: 98/75 (02/06 0322) Pulse Rate: 57 (02/05 2318)  Labs: Recent Labs    02/23/24 1139 02/23/24 1145 02/23/24 1734 02/24/24 0000 02/24/24 0239 02/24/24 0832 02/24/24 1836 02/25/24 0211 02/26/24 0314  HGB 10.1*   < >  --   --  8.6*  --   --  9.5* 10.4*  HCT 35.9*   < >  --   --  30.7*  --   --  33.4* 37.5  PLT 141*  --   --   --  108*  --   --  107* 117*  LABPROT 18.3*  --   --   --  21.3*  --   --  19.8*  --   INR 1.4*  --   --   --  1.7*  --   --  1.6*  --   HEPARINUNFRC  --   --   --    < >  --  0.23* 0.21* 0.39 0.24*  CREATININE 3.07*   < > 3.33*   < >  --  3.42*  --  3.71* 4.40*  CKTOTAL  --   --  140  --   --   --   --   --   --    < > = values in this interval not displayed.    Estimated Creatinine Clearance: 9.3 mL/min (A) (by C-G formula based on SCr of 4.4 mg/dL (H)).   Assessment: 89 y.o. female with h/o Afib and MVR, Coumadin  on hold and INR subtherapeutic, for heparin   Goal of Therapy:  Heparin  level 0.3-0.7 units/ml Monitor platelets by anticoagulation protocol: Yes   Plan:  Increase Heparin  1400 units/hr Check heparin  level in 8 hours.  Cathlyn Arrant, PharmD, BCPS      [1]  Allergies Allergen Reactions   Iodinated Contrast Media Rash    Pt stated in the past had broken out in a rash on back and itching.

## 2024-03-03 ENCOUNTER — Ambulatory Visit

## 2024-03-22 ENCOUNTER — Ambulatory Visit: Admitting: Pulmonary Disease

## 2024-04-13 ENCOUNTER — Ambulatory Visit: Admitting: Podiatry
